# Patient Record
Sex: Female | Born: 1955 | Race: Black or African American | Hispanic: No | Marital: Single | Smoking: Never smoker
Health system: Southern US, Community
[De-identification: ages and names within clinical notes are randomized; demographics above are authoritative.]

## PROBLEM LIST (undated history)

## (undated) DIAGNOSIS — R569 Unspecified convulsions: Secondary | ICD-10-CM

## (undated) DIAGNOSIS — E785 Hyperlipidemia, unspecified: Secondary | ICD-10-CM

## (undated) DIAGNOSIS — I4891 Unspecified atrial fibrillation: Secondary | ICD-10-CM

## (undated) DIAGNOSIS — I10 Essential (primary) hypertension: Secondary | ICD-10-CM

## (undated) DIAGNOSIS — J189 Pneumonia, unspecified organism: Secondary | ICD-10-CM

## (undated) DIAGNOSIS — G40909 Epilepsy, unspecified, not intractable, without status epilepticus: Secondary | ICD-10-CM

## (undated) DIAGNOSIS — D649 Anemia, unspecified: Secondary | ICD-10-CM

## (undated) DIAGNOSIS — F329 Major depressive disorder, single episode, unspecified: Secondary | ICD-10-CM

## (undated) DIAGNOSIS — C73 Malignant neoplasm of thyroid gland: Secondary | ICD-10-CM

## (undated) DIAGNOSIS — R112 Nausea with vomiting, unspecified: Secondary | ICD-10-CM

## (undated) DIAGNOSIS — Z9889 Other specified postprocedural states: Secondary | ICD-10-CM

## (undated) DIAGNOSIS — E669 Obesity, unspecified: Secondary | ICD-10-CM

## (undated) DIAGNOSIS — R0600 Dyspnea, unspecified: Secondary | ICD-10-CM

## (undated) DIAGNOSIS — T07XXXA Unspecified multiple injuries, initial encounter: Secondary | ICD-10-CM

## (undated) DIAGNOSIS — F32A Depression, unspecified: Secondary | ICD-10-CM

## (undated) DIAGNOSIS — F70 Mild intellectual disabilities: Secondary | ICD-10-CM

## (undated) HISTORY — DX: Unspecified convulsions: R56.9

## (undated) HISTORY — DX: Obesity, unspecified: E66.9

## (undated) HISTORY — DX: Unspecified atrial fibrillation: I48.91

## (undated) HISTORY — DX: Essential (primary) hypertension: I10

## (undated) HISTORY — DX: Depression, unspecified: F32.A

## (undated) HISTORY — DX: Major depressive disorder, single episode, unspecified: F32.9

## (undated) HISTORY — DX: Unspecified multiple injuries, initial encounter: T07.XXXA

## (undated) HISTORY — DX: Hyperlipidemia, unspecified: E78.5

## (undated) HISTORY — DX: Epilepsy, unspecified, not intractable, without status epilepticus: G40.909

## (undated) HISTORY — DX: Mild intellectual disabilities: F70

## (undated) HISTORY — PX: TUBAL LIGATION: SHX77

---

## 1994-02-24 HISTORY — PX: BREAST LUMPECTOMY: SHX2

## 1999-02-25 HISTORY — PX: ABDOMINAL HYSTERECTOMY: SHX81

## 2000-12-09 ENCOUNTER — Ambulatory Visit (HOSPITAL_COMMUNITY): Admission: RE | Admit: 2000-12-09 | Discharge: 2000-12-09 | Payer: Self-pay | Admitting: General Surgery

## 2001-06-27 ENCOUNTER — Emergency Department (HOSPITAL_COMMUNITY): Admission: EM | Admit: 2001-06-27 | Discharge: 2001-06-27 | Payer: Self-pay | Admitting: *Deleted

## 2001-06-27 ENCOUNTER — Encounter: Payer: Self-pay | Admitting: *Deleted

## 2001-09-01 ENCOUNTER — Encounter: Payer: Self-pay | Admitting: Family Medicine

## 2001-09-01 ENCOUNTER — Ambulatory Visit (HOSPITAL_COMMUNITY): Admission: RE | Admit: 2001-09-01 | Discharge: 2001-09-01 | Payer: Self-pay | Admitting: Family Medicine

## 2002-10-30 ENCOUNTER — Emergency Department (HOSPITAL_COMMUNITY): Admission: EM | Admit: 2002-10-30 | Discharge: 2002-10-30 | Payer: Self-pay | Admitting: *Deleted

## 2003-01-11 ENCOUNTER — Ambulatory Visit (HOSPITAL_COMMUNITY): Admission: RE | Admit: 2003-01-11 | Discharge: 2003-01-11 | Payer: Self-pay | Admitting: Family Medicine

## 2003-08-21 ENCOUNTER — Emergency Department (HOSPITAL_COMMUNITY): Admission: EM | Admit: 2003-08-21 | Discharge: 2003-08-21 | Payer: Self-pay | Admitting: Emergency Medicine

## 2004-03-06 ENCOUNTER — Ambulatory Visit: Payer: Self-pay | Admitting: Family Medicine

## 2004-03-14 ENCOUNTER — Ambulatory Visit (HOSPITAL_COMMUNITY): Admission: RE | Admit: 2004-03-14 | Discharge: 2004-03-14 | Payer: Self-pay | Admitting: Family Medicine

## 2004-04-23 ENCOUNTER — Ambulatory Visit: Payer: Self-pay | Admitting: Family Medicine

## 2004-05-09 ENCOUNTER — Ambulatory Visit (HOSPITAL_COMMUNITY): Admission: RE | Admit: 2004-05-09 | Discharge: 2004-05-09 | Payer: Self-pay | Admitting: General Surgery

## 2004-05-09 LAB — HM COLONOSCOPY: HM Colonoscopy: NORMAL

## 2004-06-01 ENCOUNTER — Emergency Department (HOSPITAL_COMMUNITY): Admission: EM | Admit: 2004-06-01 | Discharge: 2004-06-01 | Payer: Self-pay | Admitting: Emergency Medicine

## 2004-08-21 ENCOUNTER — Ambulatory Visit: Payer: Self-pay | Admitting: Family Medicine

## 2004-12-13 ENCOUNTER — Ambulatory Visit: Payer: Self-pay | Admitting: Family Medicine

## 2004-12-13 ENCOUNTER — Emergency Department (HOSPITAL_COMMUNITY): Admission: EM | Admit: 2004-12-13 | Discharge: 2004-12-13 | Payer: Self-pay | Admitting: Emergency Medicine

## 2004-12-19 ENCOUNTER — Emergency Department (HOSPITAL_COMMUNITY): Admission: EM | Admit: 2004-12-19 | Discharge: 2004-12-19 | Payer: Self-pay | Admitting: Emergency Medicine

## 2004-12-31 ENCOUNTER — Ambulatory Visit: Payer: Self-pay | Admitting: Family Medicine

## 2005-02-06 ENCOUNTER — Ambulatory Visit: Payer: Self-pay | Admitting: Family Medicine

## 2005-02-11 ENCOUNTER — Ambulatory Visit (HOSPITAL_COMMUNITY): Admission: RE | Admit: 2005-02-11 | Discharge: 2005-02-11 | Payer: Self-pay | Admitting: Family Medicine

## 2005-02-18 ENCOUNTER — Encounter (HOSPITAL_COMMUNITY): Admission: RE | Admit: 2005-02-18 | Discharge: 2005-02-18 | Payer: Self-pay | Admitting: Family Medicine

## 2005-02-27 ENCOUNTER — Ambulatory Visit: Payer: Self-pay | Admitting: Family Medicine

## 2005-04-03 ENCOUNTER — Ambulatory Visit: Payer: Self-pay | Admitting: Family Medicine

## 2005-05-01 ENCOUNTER — Ambulatory Visit: Payer: Self-pay | Admitting: Family Medicine

## 2005-05-18 ENCOUNTER — Emergency Department (HOSPITAL_COMMUNITY): Admission: EM | Admit: 2005-05-18 | Discharge: 2005-05-18 | Payer: Self-pay | Admitting: Emergency Medicine

## 2005-05-22 ENCOUNTER — Ambulatory Visit: Payer: Self-pay | Admitting: Family Medicine

## 2005-05-26 ENCOUNTER — Ambulatory Visit (HOSPITAL_COMMUNITY): Admission: RE | Admit: 2005-05-26 | Discharge: 2005-05-26 | Payer: Self-pay | Admitting: Family Medicine

## 2005-06-03 ENCOUNTER — Other Ambulatory Visit: Admission: RE | Admit: 2005-06-03 | Discharge: 2005-06-03 | Payer: Self-pay | Admitting: Family Medicine

## 2005-06-03 ENCOUNTER — Encounter: Payer: Self-pay | Admitting: Family Medicine

## 2005-06-03 ENCOUNTER — Ambulatory Visit: Payer: Self-pay | Admitting: Family Medicine

## 2005-07-14 ENCOUNTER — Ambulatory Visit: Payer: Self-pay | Admitting: Family Medicine

## 2005-08-14 ENCOUNTER — Ambulatory Visit (HOSPITAL_COMMUNITY): Admission: RE | Admit: 2005-08-14 | Discharge: 2005-08-14 | Payer: Self-pay | Admitting: General Surgery

## 2005-09-08 ENCOUNTER — Ambulatory Visit: Payer: Self-pay | Admitting: Family Medicine

## 2005-09-09 ENCOUNTER — Ambulatory Visit (HOSPITAL_COMMUNITY): Admission: RE | Admit: 2005-09-09 | Discharge: 2005-09-09 | Payer: Self-pay | Admitting: Family Medicine

## 2005-09-17 ENCOUNTER — Encounter (HOSPITAL_COMMUNITY): Admission: RE | Admit: 2005-09-17 | Discharge: 2005-10-17 | Payer: Self-pay | Admitting: Endocrinology

## 2005-10-14 ENCOUNTER — Ambulatory Visit: Payer: Self-pay | Admitting: Family Medicine

## 2005-10-30 ENCOUNTER — Encounter (HOSPITAL_COMMUNITY): Admission: RE | Admit: 2005-10-30 | Discharge: 2005-11-22 | Payer: Self-pay | Admitting: Endocrinology

## 2005-11-19 ENCOUNTER — Ambulatory Visit: Payer: Self-pay | Admitting: Family Medicine

## 2005-11-28 ENCOUNTER — Ambulatory Visit: Payer: Self-pay | Admitting: Family Medicine

## 2005-12-10 ENCOUNTER — Ambulatory Visit: Payer: Self-pay | Admitting: Family Medicine

## 2005-12-12 ENCOUNTER — Ambulatory Visit: Payer: Self-pay | Admitting: Family Medicine

## 2006-02-12 ENCOUNTER — Ambulatory Visit: Payer: Self-pay | Admitting: Family Medicine

## 2006-02-23 ENCOUNTER — Ambulatory Visit: Payer: Self-pay | Admitting: Family Medicine

## 2006-03-24 ENCOUNTER — Ambulatory Visit: Payer: Self-pay | Admitting: Family Medicine

## 2006-03-25 ENCOUNTER — Encounter: Payer: Self-pay | Admitting: Family Medicine

## 2006-03-25 LAB — CONVERTED CEMR LAB
ALT: 16 units/L (ref 0–35)
AST: 30 units/L (ref 0–37)
Albumin: 4.4 g/dL (ref 3.5–5.2)
Alkaline Phosphatase: 128 units/L — ABNORMAL HIGH (ref 39–117)
BUN: 30 mg/dL — ABNORMAL HIGH (ref 6–23)
Bilirubin, Direct: 0.1 mg/dL (ref 0.0–0.3)
CO2: 22 meq/L (ref 19–32)
Calcium: 9.3 mg/dL (ref 8.4–10.5)
Chloride: 104 meq/L (ref 96–112)
Cholesterol: 185 mg/dL (ref 0–200)
Creatinine, Ser: 1.22 mg/dL — ABNORMAL HIGH (ref 0.40–1.20)
Glucose, Bld: 69 mg/dL — ABNORMAL LOW (ref 70–99)
HDL: 57 mg/dL (ref >39)
Indirect Bilirubin: 0.6 mg/dL (ref 0.0–0.9)
LDL Cholesterol: 112 mg/dL — ABNORMAL HIGH (ref 0–99)
Potassium: 5.6 meq/L — ABNORMAL HIGH (ref 3.5–5.3)
Sodium: 140 meq/L (ref 135–145)
Total Bilirubin: 0.7 mg/dL (ref 0.3–1.2)
Total CHOL/HDL Ratio: 3.2
Total Protein: 8.6 g/dL — ABNORMAL HIGH (ref 6.0–8.3)
Triglycerides: 80 mg/dL (ref <150)
VLDL: 16 mg/dL (ref 0–40)

## 2006-04-09 ENCOUNTER — Ambulatory Visit: Payer: Self-pay | Admitting: Family Medicine

## 2006-06-15 ENCOUNTER — Ambulatory Visit: Payer: Self-pay | Admitting: Family Medicine

## 2006-07-28 ENCOUNTER — Ambulatory Visit: Payer: Self-pay | Admitting: Family Medicine

## 2006-07-30 ENCOUNTER — Ambulatory Visit (HOSPITAL_COMMUNITY): Admission: RE | Admit: 2006-07-30 | Discharge: 2006-07-30 | Payer: Self-pay | Admitting: Family Medicine

## 2006-08-18 ENCOUNTER — Ambulatory Visit: Payer: Self-pay | Admitting: Family Medicine

## 2006-08-25 ENCOUNTER — Encounter: Payer: Self-pay | Admitting: Family Medicine

## 2006-08-25 LAB — CONVERTED CEMR LAB
ALT: 12 units/L (ref 0–35)
AST: 18 units/L (ref 0–37)
Albumin: 4.6 g/dL (ref 3.5–5.2)
Alkaline Phosphatase: 112 units/L (ref 39–117)
BUN: 28 mg/dL — ABNORMAL HIGH (ref 6–23)
Bilirubin, Direct: 0.1 mg/dL (ref 0.0–0.3)
CO2: 23 meq/L (ref 19–32)
Calcium: 9.2 mg/dL (ref 8.4–10.5)
Chloride: 104 meq/L (ref 96–112)
Cholesterol: 167 mg/dL (ref 0–200)
Creatinine, Ser: 1.39 mg/dL — ABNORMAL HIGH (ref 0.40–1.20)
Glucose, Bld: 76 mg/dL (ref 70–99)
HDL: 53 mg/dL (ref >39)
Indirect Bilirubin: 0.5 mg/dL (ref 0.0–0.9)
LDL Cholesterol: 100 mg/dL — ABNORMAL HIGH (ref 0–99)
Potassium: 3.9 meq/L (ref 3.5–5.3)
Sodium: 140 meq/L (ref 135–145)
Total Bilirubin: 0.6 mg/dL (ref 0.3–1.2)
Total CHOL/HDL Ratio: 3.2
Total Protein: 8.3 g/dL (ref 6.0–8.3)
Triglycerides: 70 mg/dL (ref <150)
VLDL: 14 mg/dL (ref 0–40)

## 2006-09-02 ENCOUNTER — Encounter: Payer: Self-pay | Admitting: Family Medicine

## 2006-09-02 ENCOUNTER — Ambulatory Visit: Payer: Self-pay | Admitting: Family Medicine

## 2006-09-02 ENCOUNTER — Other Ambulatory Visit: Admission: RE | Admit: 2006-09-02 | Discharge: 2006-09-02 | Payer: Self-pay | Admitting: Family Medicine

## 2006-09-02 LAB — CONVERTED CEMR LAB
Chlamydia, DNA Probe: NEGATIVE
GC Probe Amp, Genital: NEGATIVE
Pap Smear: NORMAL

## 2006-09-03 ENCOUNTER — Encounter: Payer: Self-pay | Admitting: Family Medicine

## 2006-09-03 LAB — CONVERTED CEMR LAB
Candida species: NEGATIVE
Gardnerella vaginalis: POSITIVE — AB
Trichomonal Vaginitis: NEGATIVE

## 2006-10-12 ENCOUNTER — Ambulatory Visit: Payer: Self-pay | Admitting: Family Medicine

## 2006-10-28 ENCOUNTER — Ambulatory Visit: Payer: Self-pay | Admitting: Family Medicine

## 2006-11-03 ENCOUNTER — Encounter: Payer: Self-pay | Admitting: Family Medicine

## 2006-11-03 LAB — CONVERTED CEMR LAB
ALT: 13 units/L (ref 0–35)
AST: 21 units/L (ref 0–37)
Albumin: 4.5 g/dL (ref 3.5–5.2)
Alkaline Phosphatase: 109 units/L (ref 39–117)
BUN: 21 mg/dL (ref 6–23)
Bilirubin, Direct: 0.2 mg/dL (ref 0.0–0.3)
CO2: 23 meq/L (ref 19–32)
Calcium: 9.3 mg/dL (ref 8.4–10.5)
Chloride: 103 meq/L (ref 96–112)
Cholesterol: 174 mg/dL (ref 0–200)
Creatinine, Ser: 1.41 mg/dL — ABNORMAL HIGH (ref 0.40–1.20)
Glucose, Bld: 82 mg/dL (ref 70–99)
HDL: 60 mg/dL (ref >39)
Indirect Bilirubin: 0.7 mg/dL (ref 0.0–0.9)
LDL Cholesterol: 98 mg/dL (ref 0–99)
Potassium: 3.7 meq/L (ref 3.5–5.3)
Sodium: 141 meq/L (ref 135–145)
Total Bilirubin: 0.9 mg/dL (ref 0.3–1.2)
Total CHOL/HDL Ratio: 2.9
Total Protein: 8.3 g/dL (ref 6.0–8.3)
Triglycerides: 78 mg/dL (ref <150)
VLDL: 16 mg/dL (ref 0–40)

## 2006-11-25 ENCOUNTER — Ambulatory Visit: Payer: Self-pay | Admitting: Family Medicine

## 2006-11-26 ENCOUNTER — Encounter: Payer: Self-pay | Admitting: Family Medicine

## 2006-11-28 ENCOUNTER — Emergency Department (HOSPITAL_COMMUNITY): Admission: EM | Admit: 2006-11-28 | Discharge: 2006-11-28 | Payer: Self-pay | Admitting: Emergency Medicine

## 2006-11-30 ENCOUNTER — Ambulatory Visit: Payer: Self-pay | Admitting: Family Medicine

## 2006-12-22 ENCOUNTER — Ambulatory Visit: Payer: Self-pay | Admitting: Family Medicine

## 2007-02-04 ENCOUNTER — Ambulatory Visit: Payer: Self-pay | Admitting: Family Medicine

## 2007-03-02 ENCOUNTER — Ambulatory Visit: Payer: Self-pay | Admitting: Family Medicine

## 2007-04-12 ENCOUNTER — Ambulatory Visit: Payer: Self-pay | Admitting: Family Medicine

## 2007-04-28 ENCOUNTER — Encounter: Payer: Self-pay | Admitting: Family Medicine

## 2007-04-28 LAB — CONVERTED CEMR LAB
ALT: 13 units/L (ref 0–35)
AST: 20 units/L (ref 0–37)
Albumin: 4.2 g/dL (ref 3.5–5.2)
Alkaline Phosphatase: 98 units/L (ref 39–117)
BUN: 30 mg/dL — ABNORMAL HIGH (ref 6–23)
Bilirubin, Direct: 0.1 mg/dL (ref 0.0–0.3)
CO2: 22 meq/L (ref 19–32)
Calcium: 9.5 mg/dL (ref 8.4–10.5)
Chloride: 105 meq/L (ref 96–112)
Cholesterol: 249 mg/dL — ABNORMAL HIGH (ref 0–200)
Creatinine, Ser: 1.24 mg/dL — ABNORMAL HIGH (ref 0.40–1.20)
Glucose, Bld: 94 mg/dL (ref 70–99)
HDL: 56 mg/dL (ref >39)
Indirect Bilirubin: 0.5 mg/dL (ref 0.0–0.9)
LDL Cholesterol: 172 mg/dL — ABNORMAL HIGH (ref 0–99)
Potassium: 4.9 meq/L (ref 3.5–5.3)
Sodium: 138 meq/L (ref 135–145)
Total Bilirubin: 0.6 mg/dL (ref 0.3–1.2)
Total CHOL/HDL Ratio: 4.4
Total Protein: 7.9 g/dL (ref 6.0–8.3)
Triglycerides: 107 mg/dL (ref <150)
VLDL: 21 mg/dL (ref 0–40)

## 2007-05-04 ENCOUNTER — Ambulatory Visit: Payer: Self-pay | Admitting: Family Medicine

## 2007-05-20 ENCOUNTER — Ambulatory Visit: Payer: Self-pay | Admitting: Family Medicine

## 2007-05-21 ENCOUNTER — Encounter: Payer: Self-pay | Admitting: Family Medicine

## 2007-06-03 ENCOUNTER — Ambulatory Visit: Payer: Self-pay | Admitting: Family Medicine

## 2007-07-15 ENCOUNTER — Encounter: Payer: Self-pay | Admitting: Family Medicine

## 2007-07-15 DIAGNOSIS — E785 Hyperlipidemia, unspecified: Secondary | ICD-10-CM | POA: Insufficient documentation

## 2007-07-15 DIAGNOSIS — F419 Anxiety disorder, unspecified: Secondary | ICD-10-CM | POA: Insufficient documentation

## 2007-07-15 DIAGNOSIS — R569 Unspecified convulsions: Secondary | ICD-10-CM | POA: Insufficient documentation

## 2007-07-15 DIAGNOSIS — F329 Major depressive disorder, single episode, unspecified: Secondary | ICD-10-CM | POA: Insufficient documentation

## 2007-07-15 DIAGNOSIS — F70 Mild intellectual disabilities: Secondary | ICD-10-CM | POA: Insufficient documentation

## 2007-07-15 DIAGNOSIS — F32A Depression, unspecified: Secondary | ICD-10-CM | POA: Insufficient documentation

## 2007-07-15 DIAGNOSIS — I10 Essential (primary) hypertension: Secondary | ICD-10-CM | POA: Insufficient documentation

## 2007-07-15 DIAGNOSIS — G40909 Epilepsy, unspecified, not intractable, without status epilepticus: Secondary | ICD-10-CM | POA: Insufficient documentation

## 2007-07-30 ENCOUNTER — Encounter: Payer: Self-pay | Admitting: Family Medicine

## 2007-07-30 LAB — CONVERTED CEMR LAB
ALT: 13 units/L (ref 0–35)
AST: 20 units/L (ref 0–37)
Albumin: 4.3 g/dL (ref 3.5–5.2)
Alkaline Phosphatase: 104 units/L (ref 39–117)
Bilirubin, Direct: 0.1 mg/dL (ref 0.0–0.3)
Cholesterol: 179 mg/dL (ref 0–200)
HDL: 62 mg/dL (ref >39)
Indirect Bilirubin: 0.4 mg/dL (ref 0.0–0.9)
LDL Cholesterol: 102 mg/dL — ABNORMAL HIGH (ref 0–99)
Total Bilirubin: 0.5 mg/dL (ref 0.3–1.2)
Total CHOL/HDL Ratio: 2.9
Total Protein: 8 g/dL (ref 6.0–8.3)
Triglycerides: 76 mg/dL (ref <150)
VLDL: 15 mg/dL (ref 0–40)

## 2007-08-04 ENCOUNTER — Ambulatory Visit: Payer: Self-pay | Admitting: Family Medicine

## 2007-08-17 ENCOUNTER — Ambulatory Visit: Payer: Self-pay | Admitting: Family Medicine

## 2007-09-15 ENCOUNTER — Other Ambulatory Visit: Admission: RE | Admit: 2007-09-15 | Discharge: 2007-09-15 | Payer: Self-pay | Admitting: Family Medicine

## 2007-09-15 ENCOUNTER — Ambulatory Visit: Payer: Self-pay | Admitting: Family Medicine

## 2007-09-15 ENCOUNTER — Encounter: Payer: Self-pay | Admitting: Family Medicine

## 2007-10-04 ENCOUNTER — Encounter: Payer: Self-pay | Admitting: Family Medicine

## 2007-10-05 ENCOUNTER — Encounter: Payer: Self-pay | Admitting: Family Medicine

## 2007-10-14 ENCOUNTER — Encounter: Payer: Self-pay | Admitting: Family Medicine

## 2007-11-10 ENCOUNTER — Encounter: Payer: Self-pay | Admitting: Family Medicine

## 2007-11-16 ENCOUNTER — Encounter: Payer: Self-pay | Admitting: Family Medicine

## 2007-12-03 ENCOUNTER — Telehealth: Payer: Self-pay | Admitting: Family Medicine

## 2007-12-08 ENCOUNTER — Encounter: Payer: Self-pay | Admitting: Family Medicine

## 2007-12-14 ENCOUNTER — Ambulatory Visit: Payer: Self-pay | Admitting: Family Medicine

## 2007-12-16 ENCOUNTER — Ambulatory Visit: Payer: Self-pay | Admitting: Family Medicine

## 2008-02-01 ENCOUNTER — Emergency Department (HOSPITAL_COMMUNITY): Admission: EM | Admit: 2008-02-01 | Discharge: 2008-02-01 | Payer: Self-pay | Admitting: Emergency Medicine

## 2008-02-03 ENCOUNTER — Ambulatory Visit: Payer: Self-pay | Admitting: Family Medicine

## 2008-02-11 DIAGNOSIS — A638 Other specified predominantly sexually transmitted diseases: Secondary | ICD-10-CM | POA: Insufficient documentation

## 2008-02-28 ENCOUNTER — Encounter: Payer: Self-pay | Admitting: Family Medicine

## 2008-03-13 ENCOUNTER — Encounter: Payer: Self-pay | Admitting: Family Medicine

## 2008-03-27 ENCOUNTER — Ambulatory Visit: Payer: Self-pay | Admitting: Family Medicine

## 2008-04-04 ENCOUNTER — Encounter: Payer: Self-pay | Admitting: Family Medicine

## 2008-04-17 ENCOUNTER — Encounter: Payer: Self-pay | Admitting: Family Medicine

## 2008-04-18 ENCOUNTER — Encounter: Payer: Self-pay | Admitting: Family Medicine

## 2008-04-18 LAB — CONVERTED CEMR LAB
ALT: 13 units/L (ref 0–35)
AST: 18 units/L (ref 0–37)
Albumin: 4.4 g/dL (ref 3.5–5.2)
Alkaline Phosphatase: 107 units/L (ref 39–117)
BUN: 25 mg/dL — ABNORMAL HIGH (ref 6–23)
Bilirubin, Direct: 0.2 mg/dL (ref 0.0–0.3)
CO2: 19 meq/L (ref 19–32)
Calcium: 9.5 mg/dL (ref 8.4–10.5)
Chloride: 104 meq/L (ref 96–112)
Cholesterol: 196 mg/dL (ref 0–200)
Creatinine, Ser: 1.34 mg/dL — ABNORMAL HIGH (ref 0.40–1.20)
Glucose, Bld: 82 mg/dL (ref 70–99)
HDL: 56 mg/dL (ref >39)
Indirect Bilirubin: 0.4 mg/dL (ref 0.0–0.9)
LDL Cholesterol: 120 mg/dL — ABNORMAL HIGH (ref 0–99)
Potassium: 4.3 meq/L (ref 3.5–5.3)
Sodium: 139 meq/L (ref 135–145)
Total Bilirubin: 0.6 mg/dL (ref 0.3–1.2)
Total CHOL/HDL Ratio: 3.5
Total Protein: 8.4 g/dL — ABNORMAL HIGH (ref 6.0–8.3)
Triglycerides: 99 mg/dL (ref <150)
VLDL: 20 mg/dL (ref 0–40)

## 2008-04-20 ENCOUNTER — Ambulatory Visit: Payer: Self-pay | Admitting: Family Medicine

## 2008-04-26 ENCOUNTER — Ambulatory Visit (HOSPITAL_COMMUNITY): Admission: RE | Admit: 2008-04-26 | Discharge: 2008-04-26 | Payer: Self-pay | Admitting: Family Medicine

## 2008-06-12 ENCOUNTER — Ambulatory Visit: Payer: Self-pay | Admitting: Family Medicine

## 2008-06-13 ENCOUNTER — Encounter: Payer: Self-pay | Admitting: Family Medicine

## 2008-08-02 ENCOUNTER — Encounter: Payer: Self-pay | Admitting: Family Medicine

## 2008-08-02 LAB — CONVERTED CEMR LAB
ALT: 15 units/L (ref 0–35)
AST: 20 units/L (ref 0–37)
Albumin: 4.1 g/dL (ref 3.5–5.2)
Alkaline Phosphatase: 104 units/L (ref 39–117)
Basophils Absolute: 0 10*3/uL (ref 0.0–0.1)
Basophils Relative: 1 % (ref 0–1)
Bilirubin, Direct: 0.1 mg/dL (ref 0.0–0.3)
Cholesterol: 172 mg/dL (ref 0–200)
Eosinophils Absolute: 0.1 10*3/uL (ref 0.0–0.7)
Eosinophils Relative: 2 % (ref 0–5)
HCT: 34.8 % — ABNORMAL LOW (ref 36.0–46.0)
HDL: 51 mg/dL (ref >39)
Hemoglobin: 11.3 g/dL — ABNORMAL LOW (ref 12.0–15.0)
Indirect Bilirubin: 0.4 mg/dL (ref 0.0–0.9)
LDL Cholesterol: 104 mg/dL — ABNORMAL HIGH (ref 0–99)
Lymphocytes Relative: 40 % (ref 12–46)
Lymphs Abs: 1.4 10*3/uL (ref 0.7–4.0)
MCHC: 32.5 g/dL (ref 30.0–36.0)
MCV: 84.1 fL (ref 78.0–100.0)
Monocytes Absolute: 0.4 10*3/uL (ref 0.1–1.0)
Monocytes Relative: 11 % (ref 3–12)
Neutro Abs: 1.7 10*3/uL (ref 1.7–7.7)
Neutrophils Relative %: 47 % (ref 43–77)
Platelets: 176 10*3/uL (ref 150–400)
RBC: 4.14 M/uL (ref 3.87–5.11)
RDW: 14.7 % (ref 11.5–15.5)
Retic Ct Pct: 1.3 % (ref 0.4–3.1)
Total Bilirubin: 0.5 mg/dL (ref 0.3–1.2)
Total CHOL/HDL Ratio: 3.4
Total Protein: 8.1 g/dL (ref 6.0–8.3)
Triglycerides: 87 mg/dL (ref <150)
VLDL: 17 mg/dL (ref 0–40)
WBC: 3.6 10*3/uL — ABNORMAL LOW (ref 4.0–10.5)

## 2008-08-03 ENCOUNTER — Ambulatory Visit: Payer: Self-pay | Admitting: Family Medicine

## 2008-08-04 ENCOUNTER — Encounter: Payer: Self-pay | Admitting: Family Medicine

## 2008-08-04 LAB — CONVERTED CEMR LAB
BUN: 30 mg/dL — ABNORMAL HIGH (ref 6–23)
CO2: 18 meq/L — ABNORMAL LOW (ref 19–32)
Calcium: 9.2 mg/dL (ref 8.4–10.5)
Chloride: 109 meq/L (ref 96–112)
Creatinine, Ser: 1.52 mg/dL — ABNORMAL HIGH (ref 0.40–1.20)
Glucose, Bld: 86 mg/dL (ref 70–99)
Potassium: 4.3 meq/L (ref 3.5–5.3)
Sodium: 145 meq/L (ref 135–145)
TSH: 2.788 microintl units/mL (ref 0.350–4.500)

## 2008-08-07 ENCOUNTER — Encounter: Payer: Self-pay | Admitting: Family Medicine

## 2008-08-08 ENCOUNTER — Encounter: Payer: Self-pay | Admitting: Family Medicine

## 2008-08-14 ENCOUNTER — Encounter: Payer: Self-pay | Admitting: Family Medicine

## 2008-08-23 ENCOUNTER — Encounter: Payer: Self-pay | Admitting: Family Medicine

## 2008-09-01 ENCOUNTER — Encounter: Payer: Self-pay | Admitting: Family Medicine

## 2008-09-28 ENCOUNTER — Encounter: Payer: Self-pay | Admitting: Family Medicine

## 2008-11-29 ENCOUNTER — Encounter: Payer: Self-pay | Admitting: Family Medicine

## 2008-12-06 ENCOUNTER — Encounter: Payer: Self-pay | Admitting: Family Medicine

## 2008-12-07 ENCOUNTER — Other Ambulatory Visit: Admission: RE | Admit: 2008-12-07 | Discharge: 2008-12-07 | Payer: Self-pay | Admitting: Family Medicine

## 2008-12-07 ENCOUNTER — Encounter: Payer: Self-pay | Admitting: Family Medicine

## 2008-12-07 ENCOUNTER — Ambulatory Visit: Payer: Self-pay | Admitting: Family Medicine

## 2008-12-07 DIAGNOSIS — R079 Chest pain, unspecified: Secondary | ICD-10-CM | POA: Insufficient documentation

## 2008-12-08 ENCOUNTER — Encounter: Payer: Self-pay | Admitting: Family Medicine

## 2008-12-08 ENCOUNTER — Telehealth: Payer: Self-pay | Admitting: Family Medicine

## 2008-12-08 LAB — CONVERTED CEMR LAB
ALT: 16 units/L (ref 0–35)
AST: 23 units/L (ref 0–37)
Albumin: 4.3 g/dL (ref 3.5–5.2)
Alkaline Phosphatase: 122 units/L — ABNORMAL HIGH (ref 39–117)
BUN: 33 mg/dL — ABNORMAL HIGH (ref 6–23)
Bilirubin, Direct: 0.1 mg/dL (ref 0.0–0.3)
CO2: 20 meq/L (ref 19–32)
Calcium: 9.5 mg/dL (ref 8.4–10.5)
Chloride: 105 meq/L (ref 96–112)
Cholesterol: 171 mg/dL (ref 0–200)
Creatinine, Ser: 1.74 mg/dL — ABNORMAL HIGH (ref 0.40–1.20)
Glucose, Bld: 91 mg/dL (ref 70–99)
HDL: 56 mg/dL (ref >39)
Indirect Bilirubin: 0.3 mg/dL (ref 0.0–0.9)
LDL Cholesterol: 95 mg/dL (ref 0–99)
Potassium: 4.5 meq/L (ref 3.5–5.3)
Sodium: 142 meq/L (ref 135–145)
Total Bilirubin: 0.4 mg/dL (ref 0.3–1.2)
Total CHOL/HDL Ratio: 3.1
Total Protein: 8.4 g/dL — ABNORMAL HIGH (ref 6.0–8.3)
Triglycerides: 98 mg/dL (ref <150)
VLDL: 20 mg/dL (ref 0–40)

## 2009-01-30 ENCOUNTER — Encounter: Payer: Self-pay | Admitting: Family Medicine

## 2009-02-26 ENCOUNTER — Encounter: Payer: Self-pay | Admitting: Family Medicine

## 2009-03-20 ENCOUNTER — Encounter: Payer: Self-pay | Admitting: Family Medicine

## 2009-04-02 ENCOUNTER — Ambulatory Visit: Payer: Self-pay | Admitting: Family Medicine

## 2009-04-03 ENCOUNTER — Encounter: Payer: Self-pay | Admitting: Family Medicine

## 2009-04-04 ENCOUNTER — Encounter: Payer: Self-pay | Admitting: Family Medicine

## 2009-04-10 ENCOUNTER — Ambulatory Visit: Payer: Self-pay | Admitting: Family Medicine

## 2009-04-10 DIAGNOSIS — G619 Inflammatory polyneuropathy, unspecified: Secondary | ICD-10-CM | POA: Insufficient documentation

## 2009-04-10 DIAGNOSIS — N3000 Acute cystitis without hematuria: Secondary | ICD-10-CM | POA: Insufficient documentation

## 2009-04-10 DIAGNOSIS — G622 Polyneuropathy due to other toxic agents: Secondary | ICD-10-CM | POA: Insufficient documentation

## 2009-04-10 LAB — CONVERTED CEMR LAB
Bilirubin Urine: NEGATIVE
Glucose, Urine, Semiquant: NEGATIVE
Ketones, urine, test strip: NEGATIVE
Nitrite: NEGATIVE
Protein, U semiquant: 100
Specific Gravity, Urine: 1.02
Urobilinogen, UA: 0.2
pH: 5.5

## 2009-04-18 ENCOUNTER — Encounter: Payer: Self-pay | Admitting: Family Medicine

## 2009-05-10 ENCOUNTER — Ambulatory Visit (HOSPITAL_COMMUNITY): Admission: RE | Admit: 2009-05-10 | Discharge: 2009-05-10 | Payer: Self-pay | Admitting: Family Medicine

## 2009-05-18 ENCOUNTER — Encounter: Payer: Self-pay | Admitting: Family Medicine

## 2009-06-01 ENCOUNTER — Encounter: Payer: Self-pay | Admitting: Family Medicine

## 2009-06-08 ENCOUNTER — Encounter: Payer: Self-pay | Admitting: Family Medicine

## 2009-06-26 ENCOUNTER — Encounter: Payer: Self-pay | Admitting: Family Medicine

## 2009-07-26 ENCOUNTER — Telehealth: Payer: Self-pay | Admitting: Family Medicine

## 2009-08-06 ENCOUNTER — Ambulatory Visit: Payer: Self-pay | Admitting: Family Medicine

## 2009-08-16 ENCOUNTER — Ambulatory Visit: Payer: Self-pay | Admitting: Family Medicine

## 2009-08-16 DIAGNOSIS — E559 Vitamin D deficiency, unspecified: Secondary | ICD-10-CM | POA: Insufficient documentation

## 2009-08-16 DIAGNOSIS — R5383 Other fatigue: Secondary | ICD-10-CM

## 2009-08-16 DIAGNOSIS — R5381 Other malaise: Secondary | ICD-10-CM | POA: Insufficient documentation

## 2009-08-16 LAB — CONVERTED CEMR LAB
ALT: 11 units/L (ref 0–35)
AST: 18 units/L (ref 0–37)
Albumin: 4.3 g/dL (ref 3.5–5.2)
Alkaline Phosphatase: 126 units/L — ABNORMAL HIGH (ref 39–117)
BUN: 27 mg/dL — ABNORMAL HIGH (ref 6–23)
Basophils Absolute: 0 10*3/uL (ref 0.0–0.1)
Basophils Relative: 1 % (ref 0–1)
Bilirubin, Direct: 0.2 mg/dL (ref 0.0–0.3)
CO2: 20 meq/L (ref 19–32)
Calcium: 9.5 mg/dL (ref 8.4–10.5)
Chloride: 104 meq/L (ref 96–112)
Cholesterol: 179 mg/dL (ref 0–200)
Creatinine, Ser: 1.57 mg/dL — ABNORMAL HIGH (ref 0.40–1.20)
Eosinophils Absolute: 0.1 10*3/uL (ref 0.0–0.7)
Eosinophils Relative: 1 % (ref 0–5)
Glucose, Bld: 83 mg/dL (ref 70–99)
HCT: 36 % (ref 36.0–46.0)
HDL: 46 mg/dL (ref >39)
Hemoglobin: 11.5 g/dL — ABNORMAL LOW (ref 12.0–15.0)
Indirect Bilirubin: 0.3 mg/dL (ref 0.0–0.9)
LDL Cholesterol: 116 mg/dL — ABNORMAL HIGH (ref 0–99)
Lymphocytes Relative: 38 % (ref 12–46)
Lymphs Abs: 1.5 10*3/uL (ref 0.7–4.0)
MCHC: 31.9 g/dL (ref 30.0–36.0)
MCV: 82.6 fL (ref 78.0–100.0)
Monocytes Absolute: 0.4 10*3/uL (ref 0.1–1.0)
Monocytes Relative: 9 % (ref 3–12)
Neutro Abs: 2 10*3/uL (ref 1.7–7.7)
Neutrophils Relative %: 51 % (ref 43–77)
Platelets: 207 10*3/uL (ref 150–400)
Potassium: 4.3 meq/L (ref 3.5–5.3)
RBC: 4.36 M/uL (ref 3.87–5.11)
RDW: 14.7 % (ref 11.5–15.5)
Sodium: 139 meq/L (ref 135–145)
TSH: 1.78 microintl units/mL (ref 0.350–4.500)
Total Bilirubin: 0.5 mg/dL (ref 0.3–1.2)
Total CHOL/HDL Ratio: 3.9
Total Protein: 8.5 g/dL — ABNORMAL HIGH (ref 6.0–8.3)
Triglycerides: 84 mg/dL (ref <150)
VLDL: 17 mg/dL (ref 0–40)
Vit D, 25-Hydroxy: 38 ng/mL (ref 30–89)
WBC: 3.9 10*3/uL — ABNORMAL LOW (ref 4.0–10.5)

## 2009-08-29 ENCOUNTER — Encounter: Payer: Self-pay | Admitting: Family Medicine

## 2009-09-06 ENCOUNTER — Encounter: Payer: Self-pay | Admitting: Family Medicine

## 2009-09-11 ENCOUNTER — Ambulatory Visit (HOSPITAL_COMMUNITY): Admission: RE | Admit: 2009-09-11 | Discharge: 2009-09-11 | Payer: Self-pay | Admitting: Nephrology

## 2009-09-11 ENCOUNTER — Encounter: Payer: Self-pay | Admitting: Family Medicine

## 2009-10-02 ENCOUNTER — Encounter: Payer: Self-pay | Admitting: Family Medicine

## 2009-10-03 ENCOUNTER — Encounter: Payer: Self-pay | Admitting: Family Medicine

## 2009-12-26 ENCOUNTER — Ambulatory Visit: Payer: Self-pay | Admitting: Family Medicine

## 2009-12-26 LAB — CONVERTED CEMR LAB
ALT: <8 units/L (ref 0–35)
AST: 17 units/L (ref 0–37)
Albumin: 4.6 g/dL (ref 3.5–5.2)
Alkaline Phosphatase: 144 units/L — ABNORMAL HIGH (ref 39–117)
BUN: 20 mg/dL (ref 6–23)
Bilirubin, Direct: 0.2 mg/dL (ref 0.0–0.3)
CO2: 23 meq/L (ref 19–32)
Calcium: 9.1 mg/dL (ref 8.4–10.5)
Chloride: 104 meq/L (ref 96–112)
Cholesterol: 180 mg/dL (ref 0–200)
Creatinine, Ser: 1.51 mg/dL — ABNORMAL HIGH (ref 0.40–1.20)
Glucose, Bld: 90 mg/dL (ref 70–99)
HDL: 45 mg/dL (ref >39)
Indirect Bilirubin: 0.2 mg/dL (ref 0.0–0.9)
LDL Cholesterol: 114 mg/dL — ABNORMAL HIGH (ref 0–99)
Potassium: 4.3 meq/L (ref 3.5–5.3)
Sodium: 141 meq/L (ref 135–145)
Total Bilirubin: 0.4 mg/dL (ref 0.3–1.2)
Total CHOL/HDL Ratio: 4
Total Protein: 8.5 g/dL — ABNORMAL HIGH (ref 6.0–8.3)
Triglycerides: 105 mg/dL (ref <150)
VLDL: 21 mg/dL (ref 0–40)
Vit D, 25-Hydroxy: 47 ng/mL (ref 30–89)

## 2010-01-22 ENCOUNTER — Ambulatory Visit: Payer: Self-pay | Admitting: Family Medicine

## 2010-02-11 ENCOUNTER — Telehealth (INDEPENDENT_AMBULATORY_CARE_PROVIDER_SITE_OTHER): Payer: Self-pay | Admitting: *Deleted

## 2010-02-12 ENCOUNTER — Ambulatory Visit: Payer: Self-pay | Admitting: Family Medicine

## 2010-03-16 ENCOUNTER — Encounter: Payer: Self-pay | Admitting: Family Medicine

## 2010-03-17 ENCOUNTER — Encounter: Payer: Self-pay | Admitting: Family Medicine

## 2010-03-18 ENCOUNTER — Encounter: Payer: Self-pay | Admitting: Family Medicine

## 2010-03-24 LAB — CONVERTED CEMR LAB: OCCULT 1: NEGATIVE

## 2010-03-28 NOTE — Assessment & Plan Note (Signed)
Summary: office visit   Vital Signs:  Patient profile:   55 year old female Menstrual status:  hysterectomy Height:      64 inches Weight:      233.04 pounds BMI:     40.15 O2 Sat:      98 % on Room air Pulse rate:   106 / minute Pulse rhythm:   regular Resp:     16 per minute BP sitting:   124 / 80  (left arm)  Vitals Entered By: Louie Casa CMA (December 26, 2009 9:10 AM)  Nutrition Counseling: Patient's BMI is greater than 25 and therefore counseled on weight management options.  O2 Flow:  Room air CC: follow up   CC:  follow up.  History of Present Illness: Reports  that she has been  doing well. she is more active , butis still eating an excessive amt with continued weight gain. Denies recent fever or chills. Denies sinus pressure, nasal congestion , ear pain or sore throat. Denies chest congestion, or cough productive of sputum. Denies chest pain, palpitations, PND, orthopnea or leg swelling. Denies abdominal pain, nausea, vomitting, diarrhea or constipation. Denies change in bowel movements or bloody stool. Denies dysuria , frequency, incontinence or hesitancy. Denies  joint pain, swelling, or reduced mobility. Denies headaches, vertigo, seizures. Denies depression, anxiety or insomnia. Denies  rash, lesions, or itch.     Current Medications (verified): 1)  Loratadine 10 Mg  Tabs (Loratadine) .... Take 1 Tablet By Mouth Once A Day 2)  Lamictal 100 Mg  Tabs (Lamotrigine) .... Take 1 Tablet By Mouth Once A Day 3)  Keppra 750 Mg  Tabs (Levetiracetam) .... Take Two Tablets By Mouth Twice Daily 4)  Omeprazole 20 Mg  Tbec (Omeprazole) .... Take Two Tablets By Mouth Once Daily 5)  Fluoxetine Hcl 10 Mg Caps (Fluoxetine Hcl) .... Take 1 Capsule By Mouth Once A Day 6)  Lotensin Hct 20-12.5 Mg Tabs (Benazepril-Hydrochlorothiazide) .... One Tab By Mouth Once Daily. 7)  Vitamin D (Ergocalciferol) 50000 Unit Caps (Ergocalciferol) .... One Cap Weekly 8)  Simvastatin 40 Mg  Tabs (Simvastatin) .... Take 1 Tab By Mouth At Bedtime  Allergies (verified): No Known Drug Allergies  Review of Systems      See HPI General:  Complains of fatigue. Eyes:  Denies discharge, eye pain, and red eye. Psych:  Complains of alternate hallucination ( auditory/visual) and depression; denies mental problems, suicidal thoughts/plans, thoughts of violence, and unusual visions or sounds; improved on med. Endo:  Denies cold intolerance, excessive hunger, excessive thirst, excessive urination, and heat intolerance. Heme:  Denies abnormal bruising and bleeding. Allergy:  Denies hives or rash and itching eyes.  Physical Exam  General:  Well-developed,obese,in no acute distress; alert,appropriate and cooperative throughout examination HEENT: No facial asymmetry,  EOMI, No sinus tenderness, TM's Clear, oropharynx  pink and moist.   Chest: Clear to auscultation bilaterally.  CVS: S1, S2, No murmurs, No S3.   Abd: Soft, Nontender.  MS: Adequate ROM spine, hips, shoulders and knees.  Ext: No edema.   CNS: CN 2-12 intact, power tone and sensation normal throughout.   Skin: Intact, no visible lesions or rashes.  Psych: Good eye contact, normal affect.  Memory intact, not anxious or depressed appearing.    Impression & Recommendations:  Problem # 1:  VITAMIN D DEFICIENCY (ICD-268.9) Assessment Comment Only  Orders: T-Vitamin D (25-Hydroxy) [id number])  Problem # 2:  DEPRESSION (ICD-311) Assessment: Improved  Her updated medication list for this problem  includes:    Fluoxetine Hcl 10 Mg Caps (Fluoxetine hcl) .Marland Kitchen... Take 1 capsule by mouth once a day  Problem # 3:  SEIZURE DISORDER (ICD-780.39) Assessment: Improved  Her updated medication list for this problem includes:    Lamictal 100 Mg Tabs (Lamotrigine) .Marland Kitchen... Take 1 tablet by mouth once a day    Keppra 750 Mg Tabs (Levetiracetam) .Marland Kitchen... Take two tablets by mouth twice daily stable on current meds and followed by  neurology  Problem # 4:  HYPERTENSION (ICD-401.9) Assessment: Unchanged  Her updated medication list for this problem includes:    Lotensin Hct 20-12.5 Mg Tabs (Benazepril-hydrochlorothiazide) ..... One tab by mouth once daily.  Orders: T-Basic Metabolic Panel (99991111)  BP today: 124/80 Prior BP: 110/70 (08/16/2009)  Labs Reviewed: K+: 4.3 (08/16/2009) Creat: : 1.57 (08/16/2009)   Chol: 179 (08/16/2009)   HDL: 46 (08/16/2009)   LDL: 116 (08/16/2009)   TG: 84 (08/16/2009)  Problem # 5:  HYPERLIPIDEMIA (ICD-272.4) Assessment: Deteriorated  Her updated medication list for this problem includes:    Simvastatin 40 Mg Tabs (Simvastatin) .Marland Kitchen... Take 1 tab by mouth at bedtime  Orders: T-Hepatic Function [id number]) T-Lipid Profile [phone]) Low fat dietdiscussed and encouraged  Labs Reviewed: SGOT: 18 (08/16/2009)   SGPT: 11 (08/16/2009)   HDL:46 (08/16/2009), 56 (12/07/2008)  LDL:116 (08/16/2009), 95 (12/07/2008)  Chol:179 (08/16/2009), 171 (12/07/2008)  Trig:84 (08/16/2009), 98 (12/07/2008)  Complete Medication List: 1)  Loratadine 10 Mg Tabs (Loratadine) .... Take 1 tablet by mouth once a day 2)  Lamictal 100 Mg Tabs (Lamotrigine) .... Take 1 tablet by mouth once a day 3)  Keppra 750 Mg Tabs (Levetiracetam) .... Take two tablets by mouth twice daily 4)  Omeprazole 20 Mg Tbec (Omeprazole) .... Take two tablets by mouth once daily 5)  Fluoxetine Hcl 10 Mg Caps (Fluoxetine hcl) .... Take 1 capsule by mouth once a day 6)  Lotensin Hct 20-12.5 Mg Tabs (Benazepril-hydrochlorothiazide) .... One tab by mouth once daily. 7)  Simvastatin 40 Mg Tabs (Simvastatin) .... Take 1 tab by mouth at bedtime 8)  Oscal 500/200 D-3 500-200 Mg-unit Tabs (Calcium-vitamin d) .... Take 1 tablet by mouth three times a day  Other Orders: Influenza Vaccine NON MCR [id number])  Patient Instructions: 1)  CPE in 4 months 2)  It is important that you exercise regularly at least 20 minutes 5 times a  week. If you develop chest pain, have severe difficulty breathing, or feel very tired , stop exercising immediately and seek medical attention. 3)  You need to lose weight. Consider a lower calorie diet and regular exercise.  4)  BMP prior to visit, ICD-9: 5)  Hepatic Panel prior to visit, ICD-9:  fasting today 6)  Lipid Panel prior to visit, ICD-9: 7)  VitD 8)  Flu vac today Prescriptions: OSCAL 500/200 D-3 500-200 MG-UNIT TABS (CALCIUM-VITAMIN D) Take 1 tablet by mouth three times a day  #90 x 11   Entered and Authorized by:   Tula Nakayama MD   Signed by:   Tula Nakayama MD on 12/26/2009   Method used:   Printed then faxed to ...       [facility] (retail)       [place] [address]       [place], [place]  [address]       Ph: [phone]       Fax: [phone]   RxID:   [id number]    Orders Added: 1)  Est. Patient Level IV [99214] 2)  T-Basic Metabolic Panel 0000000 3)  T-Hepatic Function [80076-22960] 4)  T-Lipid Profile [80061-22930] 5)  T-Vitamin D (25-Hydroxy) [phone] 6)  Influenza Vaccine NON MCR [00028]   Immunizations Administered:  Influenza Vaccine:    Vaccine Type: Fluvax Non-MCR    Site: right deltoid    Mfr: novartis    Dose: 0.5 ml    Route: IM    Given by: Louie Casa CMA    Exp. Date: 06/2010    Lot #: [id number] 5p    VIS given: 09/18/09 version given December 26, 2009.   Immunizations Administered:  Influenza Vaccine:    Vaccine Type: Fluvax Non-MCR    Site: right deltoid    Mfr: novartis    Dose: 0.5 ml    Route: IM    Given by: Louie Casa CMA    Exp. Date: 06/2010    Lot #: [id number] 5p    VIS given: 09/18/09 version given December 26, 2009.

## 2010-03-28 NOTE — Miscellaneous (Signed)
Summary: Home Care Report  Home Care Report   Imported By: Dierdre Harness 05/18/2009 08:54:02  _____________________________________________________________________  External Attachment:    Type:   Image     Comment:   External Document

## 2010-03-28 NOTE — Progress Notes (Signed)
Summary: electromygraphy report  electromygraphy report   Imported By: Dierdre Harness 04/05/2009 13:26:31  _____________________________________________________________________  External Attachment:    Type:   Image     Comment:   External Document

## 2010-03-28 NOTE — Assessment & Plan Note (Signed)
Summary: office visit   Vital Signs:  Patient profile:   55 year old female Menstrual status:  hysterectomy Height:      64 inches Weight:      231 pounds BMI:     39.79 O2 Sat:      94 % Pulse rate:   81 / minute Pulse rhythm:   regular Resp:     16 per minute BP sitting:   110 / 70  (left arm) Cuff size:   large  Vitals Entered By: Kate Sable LPN (June 23, 624THL 624THL AM)  Nutrition Counseling: Patient's BMI is greater than 25 and therefore counseled on weight management options. CC: Follow up chronic problems   CC:  Follow up chronic problems.  History of Present Illness: Reports  that she has been  doing well. Denies recent fever or chills. Denies sinus pressure, nasal congestion , ear pain or sore throat. Denies chest congestion, or cough productive of sputum. Denies chest pain, palpitations, PND, orthopnea or leg swelling. Denies abdominal pain, nausea, vomitting, diarrhea or constipation.  Denies change in bowel movements or bloody stool. Denies dysuria , frequency, incontinence or hesitancy. Denies  joint pain, swelling, or reduced mobility. Denies headaches, vertigo, she does report recent seizure activity Denies depression, anxiety or insomnia. Denies  rash, lesions, or itch. She is not exercising regularly and has made no commitment to dietary change , with no change in her diet.     Current Medications (verified): 1)  Loratadine 10 Mg  Tabs (Loratadine) .... Take 1 Tablet By Mouth Once A Day 2)  Lamictal 100 Mg  Tabs (Lamotrigine) .... Take 1 Tablet By Mouth Once A Day 3)  Keppra 750 Mg  Tabs (Levetiracetam) .... Take Two Tablets By Mouth Twice Daily 4)  Omeprazole 20 Mg  Tbec (Omeprazole) .... Take Two Tablets By Mouth Once Daily 5)  Fluoxetine Hcl 10 Mg Caps (Fluoxetine Hcl) .... Take 1 Capsule By Mouth Once A Day 6)  Lotensin Hct 20-12.5 Mg Tabs (Benazepril-Hydrochlorothiazide) .... One Tab By Mouth Qd 7)  Simvastatin 80 Mg Tabs (Simvastatin) .... Take  1 Tab By Mouth At Bedtime 8)  Miralax  Powd (Polyethylene Glycol 3350) .Marland KitchenMarland KitchenMarland Kitchen 17 Grams in 8 Ounces of Water Daily 9)  Vitamin D (Ergocalciferol) 50000 Unit Caps (Ergocalciferol) .... One Cap Weekly  Allergies (verified): No Known Drug Allergies  Review of Systems      See HPI General:  Complains of fatigue. Eyes:  Denies blurring and discharge. Endo:  Denies cold intolerance, excessive hunger, excessive thirst, excessive urination, heat intolerance, polyuria, and weight change. Heme:  Denies abnormal bruising and bleeding. Allergy:  Denies hives or rash and itching eyes.  Physical Exam  General:  Well-developed,well-nourished,in no acute distress; alert,appropriate and cooperative throughout examination HEENT: No facial asymmetry,  EOMI, No sinus tenderness, TM's Clear, oropharynx  pink and moist.   Chest: Clear to auscultation bilaterally.  CVS: S1, S2, No murmurs, No S3.   Abd: Soft, Nontender.  MS: Adequate ROM spine, hips, shoulders and knees.  Ext: No edema.   CNS: CN 2-12 intact, power tone and sensation normal throughout.   Skin: Intact, no visible lesions or rashes.  Psych: Good eye contact, normal affect.  Memory intact, not anxious or depressed appearing.    Impression & Recommendations:  Problem # 1:  OBESITY (ICD-278.00) Assessment Unchanged  Ht: 64 (08/16/2009)   Wt: 231 (08/16/2009)   BMI: 39.79 (08/16/2009)  Problem # 2:  DEPRESSION (ICD-311) Assessment: Improved  Her updated  medication list for this problem includes:    Fluoxetine Hcl 10 Mg Caps (Fluoxetine hcl) .Marland Kitchen... Take 1 capsule by mouth once a day  Problem # 3:  SEIZURE DISORDER (ICD-780.39) Assessment: Deteriorated  Her updated medication list for this problem includes:    Lamictal 100 Mg Tabs (Lamotrigine) .Marland Kitchen... Take 1 tablet by mouth once a day    Keppra 750 Mg Tabs (Levetiracetam) .Marland Kitchen... Take two tablets by mouth twice daily followed by psych and reports consitency in taking her meds  Problem  # 4:  HYPERTENSION (ICD-401.9) Assessment: Unchanged  Her updated medication list for this problem includes:    Lotensin Hct 20-12.5 Mg Tabs (Benazepril-hydrochlorothiazide) ..... One tab by mouth qd  Orders: T-Basic Metabolic Panel (99991111)  BP today: 110/70 Prior BP: 110/80 (04/10/2009)  Labs Reviewed: K+: 4.5 (12/07/2008) Creat: : 1.74 (12/07/2008)   Chol: 171 (12/07/2008)   HDL: 56 (12/07/2008)   LDL: 95 (12/07/2008)   TG: 98 (12/07/2008)  Problem # 5:  HYPERLIPIDEMIA (ICD-272.4) Assessment: Comment Only  The following medications were removed from the medication list:    Simvastatin 80 Mg Tabs (Simvastatin) .Marland Kitchen... Take 1 tab by mouth at bedtime Her updated medication list for this problem includes:    Simvastatin 40 Mg Tabs (Simvastatin) .Marland Kitchen... Take 1 tab by mouth at bedtime  Orders: T-Hepatic Function [id number]) T-Lipid Profile [id number])  Labs Reviewed: SGOT: 23 (12/07/2008)   SGPT: 16 (12/07/2008)   HDL:56 (12/07/2008), 51 (08/01/2008)  LDL:95 (12/07/2008), 104 (08/01/2008)  Chol:171 (12/07/2008), 172 (08/01/2008)  Trig:98 (12/07/2008), 87 (08/01/2008)  Problem # 6:  FATIGUE (ICD-780.79) Assessment: Deteriorated  Orders: T-CBC w/Diff [id number]) T-TSH [id number])  Complete Medication List: 1)  Loratadine 10 Mg Tabs (Loratadine) .... Take 1 tablet by mouth once a day 2)  Lamictal 100 Mg Tabs (Lamotrigine) .... Take 1 tablet by mouth once a day 3)  Keppra 750 Mg Tabs (Levetiracetam) .... Take two tablets by mouth twice daily 4)  Omeprazole 20 Mg Tbec (Omeprazole) .... Take two tablets by mouth once daily 5)  Fluoxetine Hcl 10 Mg Caps (Fluoxetine hcl) .... Take 1 capsule by mouth once a day 6)  Lotensin Hct 20-12.5 Mg Tabs (Benazepril-hydrochlorothiazide) .... One tab by mouth qd 7)  Miralax Powd (Polyethylene glycol 3350) .Marland KitchenMarland KitchenMarland Kitchen 17 grams in 8 ounces of water daily 8)  Vitamin D (ergocalciferol) 50000 Unit Caps (Ergocalciferol) .... One cap weekly 9)   Simvastatin 40 Mg Tabs (Simvastatin) .... Take 1 tab by mouth at bedtime  Other Orders: T-Vitamin D (25-Hydroxy) [id number])  Patient Instructions: 1)  Please schedule a follow-up appointment in 4 months. 2)  It is important that you exercise regularly at least 20 minutes 5 times a week. If you develop chest pain, have severe difficulty breathing, or feel very tired , stop exercising immediately and seek medical attention. 3)  You need to lose weight. Consider a lower calorie diet and regular exercise.  4)  BMP prior to visit, ICD-9: 5)  Hepatic Panel prior to visit, ICD-9: 6)  Lipid Panel prior to visit, ICD-9:    7)  CBC w/ Diff prior to visit, ICD-9:  today. 8)  TSH prior to visit, ICD-9: 9)  New dose od simvastatin to start this week, med has been sent in 10)  Vit D Prescriptions: LOTENSIN HCT 20-12.5 MG TABS (BENAZEPRIL-HYDROCHLOROTHIAZIDE) ONE TAB by mouth QD  #30 x 4   Entered by:   Kate Sable LPN   Authorized by:   Tula Nakayama MD   Signed by:  Kate Sable LPN on [id number]   Method used:   Electronically to        [facility] (retail)       [place] [address]       [place], [place]  [address]       Ph: [phone]       Fax: [phone]   RxID:   [id number] OMEPRAZOLE 20 MG  TBEC (OMEPRAZOLE) Take two tablets by mouth once daily  #60 Each x 4   Entered by:   Kate Sable LPN   Authorized by:   Tula Nakayama MD   Signed by:   Kate Sable LPN on [id number]   Method used:   Electronically to        [facility] (retail)       [place] [address]       [place], [place]  [address]       Ph: [phone]       Fax: [phone]   [id number] SIMVASTATIN 40 MG TABS (SIMVASTATIN) Take 1 tab by mouth at bedtime  #30 x 4   Entered and Authorized by:   Tula Nakayama MD   Signed by:   Tula Nakayama MD on 08/16/2009   Method used:   Printed then faxed to ...       [facility]  (retail)       [place] [address]       [place], [place]  [address]       Ph: [phone]       Fax: [phone]   RxID:   [id number]

## 2010-03-28 NOTE — Miscellaneous (Signed)
Summary: Home Care Report  Home Care Report   Imported By: Dierdre Harness 06/08/2009 14:13:13  _____________________________________________________________________  External Attachment:    Type:   Image     Comment:   External Document

## 2010-03-28 NOTE — Progress Notes (Signed)
  Phone Note Call from Patient   Summary of Call: Crestor 40mg   is non preferred. Can you change to lovastatin or simvastatin? [place] Initial call taken by: Kate Sable LPN,  June  2, 624THL [id number] AM  Follow-up for Phone Call        Changed per dr. Moshe Cipro Follow-up by: Kate Sable LPN,  June  2, 624THL QA348G PM    New/Updated Medications: SIMVASTATIN 80 MG TABS (SIMVASTATIN) Take 1 tab by mouth at bedtime Prescriptions: SIMVASTATIN 80 MG TABS (SIMVASTATIN) Take 1 tab by mouth at bedtime  #30 x 3   Entered by:   Kate Sable LPN   Authorized by:   Tula Nakayama MD   Signed by:   Kate Sable LPN on [id number]   Method used:   Electronically to        [facility] (retail)       [place] [address] [place], [place]  [address]       Ph: [phone]       Fax: [phone]   RxID:   [id number]

## 2010-03-28 NOTE — Progress Notes (Signed)
Summary: dr.[email or web address]  dr.[email or web address]   Imported By: Dierdre Harness 10/08/2009 14:58:57  _____________________________________________________________________  External Attachment:    Type:   Image     Comment:   External Document

## 2010-03-28 NOTE — Progress Notes (Signed)
Summary: dr.[email or web address]  dr.[email or web address]   Imported By: Dierdre Harness 10/08/2009 14:58:19  _____________________________________________________________________  External Attachment:    Type:   Image     Comment:   External Document

## 2010-03-28 NOTE — Assessment & Plan Note (Signed)
Summary: office visit   Vital Signs:  Patient profile:   55 year old female Menstrual status:  hysterectomy Height:      64 inches Weight:      231.75 pounds BMI:     39.92 O2 Sat:      99 % Pulse rate:   72 / minute Pulse rhythm:   regular Resp:     16 per minute BP sitting:   110 / 80  (right arm) Cuff size:   large  Vitals Entered By: Kate Sable (April 10, 2009 8:55 AM)  Nutrition Counseling: Patient's BMI is greater than 25 and therefore counseled on weight management options. CC: Follow up chronic problems Is Patient Diabetic? No   CC:  Follow up chronic problems.  History of Present Illness: Reports  thatshe has been  doing well. Denies recent fever or chills. Denies sinus pressure, nasal congestion , ear pain or sore throat. Denies chest congestion, or cough productive of sputum. Denies chest pain, palpitations, PND, orthopnea or leg swelling. Denies abdominal pain, nausea, vomitting, diarrhea or constipation. Denies change in bowel movements or bloody stool.  Denies  joint pain, swelling, or reduced mobility. Denies headaches, vertigo, seizures. Denies depression, anxiety or insomnia. Denies  rash, lesions, or itch.     Current Medications (verified): 1)  Loratadine 10 Mg  Tabs (Loratadine) .... Take 1 Tablet By Mouth Once A Day 2)  Lamictal 100 Mg  Tabs (Lamotrigine) .... Take 1 Tablet By Mouth Once A Day 3)  Keppra 750 Mg  Tabs (Levetiracetam) .... Take Two Tablets By Mouth Twice Daily 4)  Omeprazole 20 Mg  Tbec (Omeprazole) .... Take Two Tablets By Mouth Once Daily 5)  Aspirin Ec 325 Mg  Tbec (Aspirin) .... Take 1 Tablet By Mouth Once A Day 6)  Fluoxetine Hcl 10 Mg Caps (Fluoxetine Hcl) .... Take 1 Capsule By Mouth Once A Day 7)  Lotensin Hct 20-12.5 Mg Tabs (Benazepril-Hydrochlorothiazide) .... One Tab By Mouth Qd 8)  Crestor 40 Mg Tabs (Rosuvastatin Calcium) .... Take 1 Tab By Mouth At Bedtime 9)  Miralax  Powd (Polyethylene Glycol 3350) .Marland KitchenMarland KitchenMarland Kitchen 17  Grams in 8 Ounces of Water Daily 10)  Vitamin D (Ergocalciferol) 50000 Unit Caps (Ergocalciferol) .... One Cap Weekly  Allergies (verified): No Known Drug Allergies  Review of Systems      See HPI Eyes:  Denies blurring and discharge. GU:  Complains of dysuria and urinary frequency; 1 week history. Neuro:  Complains of numbness. Endo:  Denies cold intolerance, excessive hunger, excessive thirst, excessive urination, heat intolerance, polyuria, and weight change. Heme:  Denies abnormal bruising and bleeding. Allergy:  Denies hives or rash and sneezing.  Physical Exam  General:  Well-developed,well-nourished,in no acute distress; alert,appropriate and cooperative throughout examination HEENT: No facial asymmetry,  EOMI, No sinus tenderness, TM's Clear, oropharynx  pink and moist.   Chest: Clear to auscultation bilaterally.  CVS: S1, S2, No murmurs, No S3.   Abd: Soft, Nontender.  MS: Adequate ROM spine, hips, shoulders and knees.  Ext: No edema.   CNS: CN 2-12 intact, power tone and sensation normal throughout.   Skin: Intact, no visible lesions or rashes.  Psych: Good eye contact, normal affect.  Memory intact, not anxious or depressed appearing.    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 Take 1 tablet by mouth once a day    Keppra 750 Mg Tabs (Levetiracetam) .Marland Kitchen... Take two tablets by mouth twice daily denies recent seizure activity  Problem # 4:  HYPERTENSION (ICD-401.9) Assessment: Unchanged  BP today: 110/80 Prior BP: 100/78 (12/07/2008)  Labs Reviewed: K+: 4.5 (12/07/2008) Creat: : 1.74 (12/07/2008)    Chol: 171 (12/07/2008)   HDL: 56 (12/07/2008)   LDL: 95 (12/07/2008)   TG: 98 (12/07/2008)  Orders: T-Basic Metabolic Panel ([id number])  Problem # 5:  POLYNEUROPATHY (ICD-357.9) Assessment: Comment Only new diagnosis being treated /investigated by neurology  Problem # 6:  ACUTE CYSTITIS (ICD-595.0) Assessment: Comment Only  Orders: UA Dipstick W/ Micro (manual) (81000) T-Culture, Urine [id number])  Encouraged to push clear liquids, get enough rest, and take acetaminophen as needed. To be seen in 10 days if no improvement, sooner if worse.  Her updated medication list for this problem includes:    Ciprofloxacin Hcl 500 Mg Tabs (Ciprofloxacin hcl) .Marland Kitchen... Take 1 tablet by mouth two times a day  Complete Medication List: 1)  Loratadine 10 Mg Tabs (Loratadine) .... Take 1 tablet by mouth once a day 2)  Lamictal 100 Mg Tabs (Lamotrigine) .... Take 1 tablet by mouth once a day 3)  Keppra 750 Mg Tabs (Levetiracetam) .... Take two tablets by mouth twice daily 4)  Omeprazole 20 Mg Tbec (Omeprazole) .... Take two tablets by mouth once daily 5)  Aspirin Ec 325 Mg Tbec (Aspirin) .... Take 1 tablet by mouth once a day 6)  Fluoxetine Hcl 10 Mg Caps (Fluoxetine hcl) .... Take 1 capsule by mouth once a day 7)  Lotensin Hct 20-12.5 Mg Tabs (Benazepril-hydrochlorothiazide) .... One tab by mouth qd 8)  Crestor 40 Mg Tabs (Rosuvastatin calcium) .... Take 1 tab by mouth at bedtime 9)  Miralax Powd (Polyethylene glycol 3350) .Marland KitchenMarland KitchenMarland Kitchen 17 grams in 8 ounces of water daily 10)  Vitamin D (ergocalciferol) 50000 Unit Caps (Ergocalciferol) .... One cap weekly 11)  Ciprofloxacin Hcl 500 Mg Tabs (Ciprofloxacin hcl) .... Take 1 tablet by mouth two times a day  Other Orders: T-Hepatic Function [phone]) T-Lipid Profile [id number]) Radiology Referral (Radiology)  Patient Instructions: 1)  f/u in early May. 2)  It is important that you exercise regularly at least 30 minutes 5 times a week. If you develop  chest pain, have severe difficulty breathing, or feel very tired , stop exercising immediately and seek medical attention. 3)  You need to lose weight. Consider a lower calorie diet and regular exercise.  4)  BMP prior to visit, ICD-9: 5)  Hepatic Panel prior to visit, ICD-9: first week in may 6)  Lipid Panel prior to visit, ICD-9: 7)  pls try to drink only water and approx 6 to 8 glasses daily Prescriptions: CIPROFLOXACIN HCL 500 MG TABS (CIPROFLOXACIN HCL) Take 1 tablet by mouth two times a day  #14 x 0   Entered and Authorized by:   Tula Nakayama MD   Signed by:   Tula Nakayama MD on 04/10/2009   Method used:   Electronically to        [facility] (retail)       [place] [address] [place], [place]  [address]       Ph: [phone]  Fax: [phone]   RxIDVD:[id number]     Laboratory Results   Urine Tests    Routine Urinalysis   Color: lt. yellow Appearance: Clear Glucose: negative   (Normal Range: Negative) Bilirubin: negative   (Normal Range: Negative) Ketone: negative   (Normal Range: Negative) Spec. Gravity: 1.020   (Normal Range: 1.003-1.035) Blood: small   (Normal Range: Negative) pH: 5.5   (Normal Range: 5.0-8.0) Protein: 100   (Normal Range: Negative) Urobilinogen: 0.2   (Normal Range: 0-1) Nitrite: negative   (Normal Range: Negative) Leukocyte Esterace: moderate   (Normal Range: Negative)

## 2010-03-28 NOTE — Miscellaneous (Signed)
Summary: Home Care Report  Home Care Report   Imported By: Dierdre Harness 08/06/2009 14:25:28  _____________________________________________________________________  External Attachment:    Type:   Image     Comment:   External Document

## 2010-03-28 NOTE — Assessment & Plan Note (Signed)
Summary: RECERT   Allergies: No Known Drug Allergies   Complete Medication List: 1)  Loratadine 10 Mg Tabs (Loratadine) .... Take 1 tablet by mouth once a day 2)  Lamictal 100 Mg Tabs (Lamotrigine) .... Take 1 tablet by mouth once a day 3)  Keppra 750 Mg Tabs (Levetiracetam) .... Take two tablets by mouth twice daily 4)  Omeprazole 20 Mg Tbec (Omeprazole) .... Take two tablets by mouth once daily 5)  Aspirin Ec 325 Mg Tbec (Aspirin) .... Take 1 tablet by mouth once a day 6)  Fluoxetine Hcl 10 Mg Caps (Fluoxetine hcl) .... Take 1 capsule by mouth once a day 7)  Lotensin Hct 20-12.5 Mg Tabs (Benazepril-hydrochlorothiazide) .... One tab by mouth qd 8)  Simvastatin 80 Mg Tabs (Simvastatin) .... Take 1 tab by mouth at bedtime 9)  Miralax Powd (Polyethylene glycol 3350) .Marland KitchenMarland KitchenMarland Kitchen 17 grams in 8 ounces of water daily 10)  Vitamin D (ergocalciferol) 50000 Unit Caps (Ergocalciferol) .... One cap weekly 11)  Ciprofloxacin Hcl 500 Mg Tabs (Ciprofloxacin hcl) .... Take 1 tablet by mouth two times a day  Other Orders: Re-certification of Home Health [phone])

## 2010-03-28 NOTE — Miscellaneous (Signed)
Summary: Home Care Report  Home Care Report   Imported By: Dierdre Harness 04/03/2009 09:27:19  _____________________________________________________________________  External Attachment:    Type:   Image     Comment:   External Document

## 2010-03-28 NOTE — Miscellaneous (Signed)
Summary: refill  Clinical Lists Changes  Medications: Rx of OMEPRAZOLE 20 MG  TBEC (OMEPRAZOLE) Take two tablets by mouth once daily;  #60 Each x 4;  Signed;  Entered by: Kate Sable;  Authorized by: Tula Nakayama MD;  Method used: Electronically to [facility]*, [place], [place], [place], [place]  [address], Ph: [phone], Fax: [phone] Rx of LOTENSIN HCT 20-12.5 MG TABS (BENAZEPRIL-HYDROCHLOROTHIAZIDE) ONE TAB by mouth QD;  #30 x 4;  Signed;  Entered by: Kate Sable;  Authorized by: Tula Nakayama MD;  Method used: Electronically to [facility]*, [place], [place], [place], [place]  [address], Ph: [phone], Fax: [phone] Rx of LORATADINE 10 MG  TABS (LORATADINE) Take 1 tablet by mouth once a day;  #30 x 4;  Signed;  Entered by: Kate Sable;  Authorized by: Tula Nakayama MD;  Method used: Electronically to [facility]*, [place], [place], [place], [place]  [address], Ph: [phone], Fax: [phone] Rx of CRESTOR 40 MG TABS (ROSUVASTATIN CALCIUM) Take 1 tab by mouth at bedtime;  #30 x 4;  Signed;  Entered by: Kate Sable;  Authorized by: Tula Nakayama MD;  Method used: Electronically to [facility]*, [place], [place], [place], [place]  [address], Ph: [phone], Fax: [phone]    Prescriptions: CRESTOR 40 MG TABS (ROSUVASTATIN CALCIUM) Take 1 tab by mouth at bedtime  #30 x 4   Entered by:   Kate Sable   Authorized by:   Tula Nakayama MD   Signed by:   Kate Sable on 02/26/2009   Method used:   Electronically to        [facility] (retail)       [place] [address] [place], [place]  [address]       Ph: [phone]       Fax: [phone]   RxIDYU:[id number] LORATADINE 10 MG  TABS (LORATADINE) Take 1 tablet by mouth once a day  #30 x 4   Entered by:   Kate Sable   Authorized by:   Tula Nakayama MD   Signed by:    Kate Sable on 02/26/2009   Method used:   Electronically to        [facility] (retail)       [place] [address] [place], [place]  [address]       Ph: [phone]       Fax: [phone]   RxIDOG:[id number] LOTENSIN HCT 20-12.5 MG TABS (BENAZEPRIL-HYDROCHLOROTHIAZIDE) ONE TAB by mouth QD  #30 x 4   Entered by:   Kate Sable   Authorized by:   Tula Nakayama MD   Signed by:   Kate Sable on 02/26/2009   Method used:   Electronically to        [facility] (retail)       [place] [address]       [place], [place]  [address]       Ph: [phone]       Fax: [phone]   [id number]:[id number] OMEPRAZOLE 20 MG  TBEC (OMEPRAZOLE) Take two tablets by mouth once daily  #60 Each x 4   Entered by:   Kate Sable   Authorized by:   Joycelyn Schmid  Navea Woodrow MD   Signed by:   Kate Sable on 02/26/2009   Method used:   Electronically to        [facility] (retail)       [place] [address]       [place], [place]  [address]       Ph: [phone]       Fax: [phone]   RxID:   [id number]

## 2010-03-28 NOTE — Miscellaneous (Signed)
Summary: Home Care Report  Home Care Report   Imported By: Dierdre Harness 06/01/2009 10:17:02  _____________________________________________________________________  External Attachment:    Type:   Image     Comment:   External Document

## 2010-03-28 NOTE — Assessment & Plan Note (Signed)
Summary: recert/rrs   Allergies: No Known Drug Allergies   Complete Medication List: 1)  Loratadine 10 Mg Tabs (Loratadine) .... Take 1 tablet by mouth once a day 2)  Lamictal 100 Mg Tabs (Lamotrigine) .... Take 1 tablet by mouth once a day 3)  Keppra 750 Mg Tabs (Levetiracetam) .... Take two tablets by mouth twice daily 4)  Omeprazole 20 Mg Tbec (Omeprazole) .... Take two tablets by mouth once daily 5)  Aspirin Ec 325 Mg Tbec (Aspirin) .... Take 1 tablet by mouth once a day 6)  Fluoxetine Hcl 10 Mg Caps (Fluoxetine hcl) .... Take 1 capsule by mouth once a day 7)  Lotensin Hct 20-12.5 Mg Tabs (Benazepril-hydrochlorothiazide) .... One tab by mouth qd 8)  Crestor 40 Mg Tabs (Rosuvastatin calcium) .... Take 1 tab by mouth at bedtime 9)  Miralax Powd (Polyethylene glycol 3350) .Marland KitchenMarland KitchenMarland Kitchen 17 grams in 8 ounces of water daily recertification info reviewed and signed

## 2010-03-28 NOTE — Miscellaneous (Signed)
Summary: Home Care Report  Home Care Report   Imported By: Dierdre Harness 10/02/2009 09:38:24  _____________________________________________________________________  External Attachment:    Type:   Image     Comment:   External Document

## 2010-03-28 NOTE — Letter (Signed)
Summary: 1ST MISSED LETTER  1ST MISSED LETTER   Imported By: Dierdre Harness 02/13/2010 13:06:38  _____________________________________________________________________  External Attachment:    Type:   Image     Comment:   External Document

## 2010-03-28 NOTE — Progress Notes (Signed)
Summary: [facility]   Imported By: Dierdre Harness 04/19/2009 10:54:38  _____________________________________________________________________  External Attachment:    Type:   Image     Comment:   External Document

## 2010-03-28 NOTE — Miscellaneous (Signed)
Summary: Home Care Report  Home Care Report   Imported By: Dierdre Harness 01/22/2010 12:00:29  _____________________________________________________________________  External Attachment:    Type:   Image     Comment:   External Document

## 2010-03-28 NOTE — Letter (Signed)
Summary: Letter / [facility] AN APPT  Letter / [facility] AN APPT   Imported By: Dierdre Harness 06/26/2009 14:18:19  _____________________________________________________________________  External Attachment:    Type:   Image     Comment:   External Document

## 2010-03-28 NOTE — Progress Notes (Signed)
Summary: COLD  Phone Note Call from Patient   Summary of Call: HAS A COLD CAN NOT HARDLY BREATHE  DOES NOT KNOW ABOUT FEVER STOPPED UP AT NIGHT AND CAN NOT SLEEP NOSE HURTING EYE RUNNY WATER  CALL BACK AT [phone] Initial call taken by: Dierdre Harness,  February 11, 2010 10:05 AM  Follow-up for Phone Call        first available ov or work in tomorrow, if none this week, urgent care Follow-up by: Baldomero Lamy LPN,  December 19, 624THL 11:55 AM  Additional Follow-up for Phone Call Additional follow up Details #1::        appt.12.20.11 @ 3:45 patient aware Additional Follow-up by: Dierdre Harness,  February 11, 2010 2:23 PM

## 2010-03-28 NOTE — Progress Notes (Signed)
Summary: [facility]   Imported By: Dierdre Harness 03/21/2009 09:42:19  _____________________________________________________________________  External Attachment:    Type:   Image     Comment:   External Document

## 2010-03-28 NOTE — Assessment & Plan Note (Signed)
Summary: recert   Allergies: No Known Drug Allergies   Complete Medication List: 1)  Loratadine 10 Mg Tabs (Loratadine) .... Take 1 tablet by mouth once a day 2)  Lamictal 100 Mg Tabs (Lamotrigine) .... Take 1 tablet by mouth once a day 3)  Keppra 750 Mg Tabs (Levetiracetam) .... Take two tablets by mouth twice daily 4)  Omeprazole 20 Mg Tbec (Omeprazole) .... Take two tablets by mouth once daily 5)  Fluoxetine Hcl 10 Mg Caps (Fluoxetine hcl) .... Take 1 capsule by mouth once a day 6)  Lotensin Hct 20-12.5 Mg Tabs (Benazepril-hydrochlorothiazide) .... One tab by mouth once daily. 7)  Simvastatin 40 Mg Tabs (Simvastatin) .... Take 1 tab by mouth at bedtime 8)  Oscal 500/200 D-3 500-200 Mg-unit Tabs (Calcium-vitamin d) .... Take 1 tablet by mouth three times a day  Other Orders: Re-certification of Home Health [phone])   Orders Added: 1)  Re-certification of [place] [id number]

## 2010-04-01 ENCOUNTER — Encounter: Payer: Self-pay | Admitting: Family Medicine

## 2010-04-11 NOTE — Miscellaneous (Signed)
Summary: recert  recert   Imported By: Dierdre Harness 04/01/2010 16:41:14  _____________________________________________________________________  External Attachment:    Type:   Image     Comment:   External Document

## 2010-04-11 NOTE — Miscellaneous (Signed)
Summary: recert  recert   Imported By: Dierdre Harness 04/01/2010 16:40:57  _____________________________________________________________________  External Attachment:    Type:   Image     Comment:   External Document

## 2010-04-25 ENCOUNTER — Other Ambulatory Visit: Payer: Self-pay | Admitting: Family Medicine

## 2010-04-25 ENCOUNTER — Encounter (INDEPENDENT_AMBULATORY_CARE_PROVIDER_SITE_OTHER): Payer: Medicaid Other | Admitting: Family Medicine

## 2010-04-25 ENCOUNTER — Encounter: Payer: Self-pay | Admitting: Family Medicine

## 2010-04-25 ENCOUNTER — Other Ambulatory Visit (HOSPITAL_COMMUNITY)
Admission: RE | Admit: 2010-04-25 | Discharge: 2010-04-25 | Disposition: A | Discharge status: Home / Self Care | Payer: Medicaid Other | Source: Ambulatory Visit | Attending: Family Medicine | Admitting: Family Medicine

## 2010-04-25 DIAGNOSIS — Z0000 Encounter for general adult medical examination without abnormal findings: Secondary | ICD-10-CM

## 2010-04-25 DIAGNOSIS — Z1211 Encounter for screening for malignant neoplasm of colon: Secondary | ICD-10-CM

## 2010-04-25 DIAGNOSIS — Z139 Encounter for screening, unspecified: Secondary | ICD-10-CM

## 2010-04-25 DIAGNOSIS — Z01419 Encounter for gynecological examination (general) (routine) without abnormal findings: Secondary | ICD-10-CM | POA: Insufficient documentation

## 2010-04-25 DIAGNOSIS — Z124 Encounter for screening for malignant neoplasm of cervix: Secondary | ICD-10-CM

## 2010-04-26 ENCOUNTER — Encounter: Payer: Self-pay | Admitting: Family Medicine

## 2010-05-01 ENCOUNTER — Other Ambulatory Visit: Payer: Self-pay | Admitting: Family Medicine

## 2010-05-01 LAB — BASIC METABOLIC PANEL
BUN: 32 mg/dL — ABNORMAL HIGH (ref 6–23)
CO2: 23 mEq/L (ref 19–32)
Calcium: 9.4 mg/dL (ref 8.4–10.5)
Chloride: 103 mEq/L (ref 96–112)
Creat: 1.43 mg/dL — ABNORMAL HIGH (ref 0.40–1.20)
Glucose, Bld: 90 mg/dL (ref 70–99)
Potassium: 4 mEq/L (ref 3.5–5.3)
Sodium: 143 mEq/L (ref 135–145)

## 2010-05-01 LAB — HEPATIC FUNCTION PANEL
ALT: 10 U/L (ref 0–35)
AST: 20 U/L (ref 0–37)
Albumin: 4.3 g/dL (ref 3.5–5.2)
Alkaline Phosphatase: 128 U/L — ABNORMAL HIGH (ref 39–117)
Bilirubin, Direct: 0.2 mg/dL (ref 0.0–0.3)
Indirect Bilirubin: 0.3 mg/dL (ref 0.0–0.9)
Total Bilirubin: 0.5 mg/dL (ref 0.3–1.2)
Total Protein: 8.1 g/dL (ref 6.0–8.3)

## 2010-05-01 LAB — LIPID PANEL
Cholesterol: 206 mg/dL — ABNORMAL HIGH (ref 0–200)
HDL: 51 mg/dL (ref >39)
LDL Cholesterol: 140 mg/dL — ABNORMAL HIGH (ref 0–99)
Total CHOL/HDL Ratio: 4 Ratio
Triglycerides: 75 mg/dL (ref <150)
VLDL: 15 mg/dL (ref 0–40)

## 2010-05-02 LAB — CONVERTED CEMR LAB
ALT: 10 U/L
AST: 20 U/L
Albumin: 4.3 g/dL
Alkaline Phosphatase: 128 U/L — ABNORMAL HIGH
BUN: 32 mg/dL — ABNORMAL HIGH
Bilirubin, Direct: 0.2 mg/dL
CO2: 23 meq/L
Calcium: 9.4 mg/dL
Chloride: 103 meq/L
Cholesterol: 206 mg/dL — ABNORMAL HIGH
Creatinine, Ser: 1.43 mg/dL — ABNORMAL HIGH
Glucose, Bld: 90 mg/dL
HDL: 51 mg/dL
Indirect Bilirubin: 0.3 mg/dL
LDL Cholesterol: 140 mg/dL — ABNORMAL HIGH
Potassium: 4 meq/L
Sodium: 143 meq/L
Total Bilirubin: 0.5 mg/dL
Total CHOL/HDL Ratio: 4
Total Protein: 8.1 g/dL
Triglycerides: 75 mg/dL
VLDL: 15 mg/dL

## 2010-05-02 NOTE — Letter (Signed)
Summary: Letter on mammo  Letter on mammo   Imported By: Lenn Cal 04/26/2010 14:14:31  _____________________________________________________________________  External Attachment:    Type:   Image     Comment:   External Document

## 2010-05-07 NOTE — Assessment & Plan Note (Signed)
Summary: physical   Vital Signs:  Patient profile:   55 year old female Menstrual status:  hysterectomy Height:      64 inches Weight:      236 pounds BMI:     40.66 O2 Sat:      98 % Pulse rate:   87 / minute Pulse rhythm:   regular Resp:     16 per minute BP sitting:   122 / 80  (left arm) Cuff size:   large  Vitals Entered By: Kate Sable LPN (March  1, [id number] AM)  Nutrition Counseling: Patient's BMI is greater than 25 and therefore counseled on weight management options. CC: CPE  Vision Screening:Left eye w/o correction: 20 / 40 Right Eye w/o correction: 20 / 40 Both eyes w/o correction:  20/ 30  Color vision testing: normal      Vision Entered By: Kate Sable LPN (March  1, [id number] [id number] AM)   CC:  CPE.  History of Present Illness: Reports  that she has been well. Denies recent fever or chills. Denies sinus pressure, nasal congestion , ear pain or sore throat. Denies chest congestion, or cough productive of sputum. Denies chest pain, palpitations, PND, orthopnea or leg swelling. Denies abdominal pain, nausea, vomitting, diarrhea or constipation. Denies change in bowel movements or bloody stool. Denies dysuria , frequency, incontinence or hesitancy. Denies  joint pain, swelling, or reduced mobility. Denies headaches, vertigo, seizures. Denies depression, anxiety or insomnia. Denies  rash, lesions, or itch.     Current Medications (verified): 1)  Loratadine 10 Mg  Tabs (Loratadine) .... Take 1 Tablet By Mouth Once A Day 2)  Lamictal 100 Mg  Tabs (Lamotrigine) .... Take 1 Tablet By Mouth Once A Day 3)  Keppra 750 Mg  Tabs (Levetiracetam) .... Take Two Tablets By Mouth Twice Daily 4)  Omeprazole 20 Mg  Tbec (Omeprazole) .... Take Two Tablets By Mouth Once Daily 5)  Fluoxetine Hcl 10 Mg Caps (Fluoxetine Hcl) .... Take 1 Capsule By Mouth Once A Day 6)  Lotensin Hct 20-12.5 Mg Tabs (Benazepril-Hydrochlorothiazide) .... One Tab By Mouth Once Daily. 7)   Simvastatin 40 Mg Tabs (Simvastatin) .... Take 1 Tab By Mouth At Bedtime 8)  Oscal 500/200 D-3 500-200 Mg-Unit Tabs (Calcium-Vitamin D) .... Take 1 Tablet By Mouth Three Times A Day  Allergies (verified): No Known Drug Allergies  Review of Systems      See HPI General:  Complains of fatigue. Eyes:  Denies discharge and red eye. Endo:  Denies cold intolerance, excessive hunger, excessive thirst, excessive urination, and heat intolerance. Heme:  Denies abnormal bruising, bleeding, and enlarge lymph nodes. Allergy:  Complains of seasonal allergies; denies hives or rash, itching eyes, and persistent infections.  Physical Exam  General:  Well-developed,obese,in no acute distress; alert,appropriate and cooperative throughout examination Head:  Normocephalic and atraumatic without obvious abnormalities. No apparent alopecia or balding. Eyes:  No corneal or conjunctival inflammation noted. EOMI. Perrla. Funduscopic exam benign, without hemorrhages, exudates or papilledema. Vision grossly normal. Ears:  External ear exam shows no significant lesions or deformities.  Otoscopic examination reveals clear canals, tympanic membranes are intact bilaterally without bulging, retraction, inflammation or discharge. Hearing is grossly normal bilaterally. Nose:  External nasal examination shows no deformity or inflammation. Nasal mucosa are pink and moist without lesions or exudates. Mouth:  pharynx pink and moist, poor dentition, and teeth missing.   Neck:  No deformities, masses, or tenderness noted. Chest Wall:  No deformities, masses, or tenderness noted.  Breasts:  No mass, nodules, thickening, tenderness, bulging, retraction, inflamation, nipple discharge or skin changes noted.   Lungs:  Normal respiratory effort, chest expands symmetrically. Lungs are clear to auscultation, no crackles or wheezes. Heart:  Normal rate and regular rhythm. S1 and S2 normal without gallop, murmur, click, rub or other extra  sounds. Abdomen:  Bowel sounds positive,abdomen soft and non-tender without masses, organomegaly or hernias noted. Rectal:  No external abnormalities noted. Normal sphincter tone. No rectal masses or tenderness. Genitalia:  normal introitus, no external lesions, no vaginal discharge, and no adnexal masses or tenderness.  Uterus absent  Msk:  No deformity or scoliosis noted of thoracic or lumbar spine.   Pulses:  R and L carotid,radial,femoral,dorsalis pedis and posterior tibial pulses are full and equal bilaterally Extremities:  No clubbing, cyanosis, edema, or deformity noted with normal full range of motion of all joints.   Neurologic:  No cranial nerve deficits noted. Station and gait are normal. Plantar reflexes are down-going bilaterally. DTRs are symmetrical throughout. Sensory, motor and coordinative functions appear intact. Skin:  Intact without suspicious lesions or rashes Cervical Nodes:  No lymphadenopathy noted Axillary Nodes:  No palpable lymphadenopathy Inguinal Nodes:  No significant adenopathy Psych:  Cognition and judgment appear intact. Alert and cooperative with normal attention span and concentration. No apparent delusions, illusions, hallucinations   Impression & Recommendations:  Problem # 1:  OBESITY (ICD-278.00) Assessment Deteriorated  Ht: 64 (04/25/2010)   Wt: 236 (04/25/2010)   BMI: 40.66 (04/25/2010) therapeutic lifestyle change discussed and encouraged  Problem # 2:  SEIZURE DISORDER (ICD-780.39) Assessment: Improved  Her updated medication list for this problem includes:    Lamictal 100 Mg Tabs (Lamotrigine) .Marland Kitchen... Take 1 tablet by mouth once a day    Keppra 750 Mg Tabs (Levetiracetam) .Marland Kitchen... Take two tablets by mouth twice daily treated by neurology  Problem # 3:  HYPERTENSION (ICD-401.9) Assessment: Unchanged  Her updated medication list for this problem includes:    Lotensin Hct 20-12.5 Mg Tabs (Benazepril-hydrochlorothiazide) ..... One tab by mouth  once daily.  Orders: T-Basic Metabolic Panel ([id number])  BP today: 122/80 Prior BP: 124/80 (12/26/2009)  Labs Reviewed: K+: 4.3 (12/26/2009) Creat: : 1.51 (12/26/2009)   Chol: 180 (12/26/2009)   HDL: 45 (12/26/2009)   LDL: 114 (12/26/2009)   TG: 105 (12/26/2009)  Problem # 4:  HYPERLIPIDEMIA (ICD-272.4) Assessment: Comment Only  Her updated medication list for this problem includes:    Simvastatin 40 Mg Tabs (Simvastatin) .Marland Kitchen... Take 1 tab by mouth at bedtime Low fat dietdiscussed and encouraged  Orders: T-Lipid Profile [id number]) T-Hepatic Function [id number])  Labs Reviewed: SGOT: 17 (12/26/2009)   SGPT: <8 U/L (12/26/2009)   HDL:45 (12/26/2009), 46 (08/16/2009)  LDL:114 (12/26/2009), 116 (08/16/2009)  Chol:180 (12/26/2009), 179 (08/16/2009)  Trig:105 (12/26/2009), 84 (08/16/2009)  Complete Medication List: 1)  Loratadine 10 Mg Tabs (Loratadine) .... Take 1 tablet by mouth once a day 2)  Lamictal 100 Mg Tabs (Lamotrigine) .... Take 1 tablet by mouth once a day 3)  Keppra 750 Mg Tabs (Levetiracetam) .... Take two tablets by mouth twice daily 4)  Omeprazole 20 Mg Tbec (Omeprazole) .... Take two tablets by mouth once daily 5)  Fluoxetine Hcl 10 Mg Caps (Fluoxetine hcl) .... Take 1 capsule by mouth once a day 6)  Lotensin Hct 20-12.5 Mg Tabs (Benazepril-hydrochlorothiazide) .... One tab by mouth once daily. 7)  Simvastatin 40 Mg Tabs (Simvastatin) .... Take 1 tab by mouth at bedtime 8)  Oscal 500/200 D-3 500-200 Mg-unit Tabs (  Calcium-vitamin d) .... Take 1 tablet by mouth three times a day  Other Orders: Hemoccult Guaiac-1 spec.(in office) ([id number]) Pap Smear [id number]) Future Orders: Radiology Referral (Radiology) ... 04/26/2010  Patient Instructions: 1)  Please schedule a follow-up appointment in 4 months. 2)  It is important that you exercise regularly at least 20 minutes 5 times a week. If you develop chest pain, have severe difficulty breathing, or feel very tired ,  stop exercising immediately and seek medical attention. 3)  You need to lose weight. Consider a lower calorie diet and regular exercise.  4)  BMP prior to visit, ICD-9: 5)  Hepatic Panel prior to visit, ICD-9:  fasting asap. 6)  Lipid 7)  No med changes at this time Prescriptions: FLUOXETINE HCL 10 MG CAPS (FLUOXETINE HCL) Take 1 capsule by mouth once a day  #30 Each x 3   Entered by:   Baldomero Lamy LPN   Authorized by:   Tula Nakayama MD   Signed by:   Baldomero Lamy LPN on 579FGE   Method used:   Electronically to        [facility] (retail)       [place] [address] [place], [place]  [address]       Ph: [phone]       Fax: [phone]   [id number] OMEPRAZOLE 20 MG  TBEC (OMEPRAZOLE) Take two tablets by mouth once daily  #60 Each x 3   Entered by:   Baldomero Lamy LPN   Authorized by:   Tula Nakayama MD   Signed by:   Baldomero Lamy LPN on 579FGE   Method used:   Electronically to        [facility] (retail)       [place] [address] [place], [place]  [address]       Ph: [phone]       Fax: [phone]   [id number] LORATADINE 10 MG  TABS (LORATADINE) Take 1 tablet by mouth once a day  #30 Each x 3   Entered by:   Baldomero Lamy LPN   Authorized by:   Tula Nakayama MD   Signed by:   Baldomero Lamy LPN on 579FGE   Method used:   Electronically to        [facility] (retail)       [place] [address]       [place], [place]  [address]       Ph: [phone]       Fax: [phone]   RxID:   [id number]    Orders Added: 1)  Est. Patient 40-64 years [id number] 2)  T-Basic Metabolic Panel [id number] 3)  T-Lipid Profile [id number] 4)  T-Hepatic Function [id number] 5)  Hemoccult Guaiac-1 spec.(in office) [id number] 6)  Pap Smear [id number] 7)  Radiology Referral [Radiology]

## 2010-05-13 ENCOUNTER — Ambulatory Visit (HOSPITAL_COMMUNITY): Payer: Medicaid Other

## 2010-06-14 ENCOUNTER — Emergency Department (HOSPITAL_COMMUNITY)
Admission: EM | Admit: 2010-06-14 | Discharge: 2010-06-15 | Disposition: A | Discharge status: Home / Self Care | Payer: Medicaid Other | Attending: Emergency Medicine | Admitting: Emergency Medicine

## 2010-06-14 ENCOUNTER — Emergency Department (HOSPITAL_COMMUNITY): Payer: Medicaid Other

## 2010-06-14 DIAGNOSIS — Y92009 Unspecified place in unspecified non-institutional (private) residence as the place of occurrence of the external cause: Secondary | ICD-10-CM | POA: Insufficient documentation

## 2010-06-14 DIAGNOSIS — Z79899 Other long term (current) drug therapy: Secondary | ICD-10-CM | POA: Insufficient documentation

## 2010-06-14 DIAGNOSIS — E7800 Pure hypercholesterolemia, unspecified: Secondary | ICD-10-CM | POA: Insufficient documentation

## 2010-06-14 DIAGNOSIS — K219 Gastro-esophageal reflux disease without esophagitis: Secondary | ICD-10-CM | POA: Insufficient documentation

## 2010-06-14 DIAGNOSIS — Y998 Other external cause status: Secondary | ICD-10-CM | POA: Insufficient documentation

## 2010-06-14 DIAGNOSIS — I10 Essential (primary) hypertension: Secondary | ICD-10-CM | POA: Insufficient documentation

## 2010-06-14 DIAGNOSIS — G40802 Other epilepsy, not intractable, without status epilepticus: Secondary | ICD-10-CM | POA: Insufficient documentation

## 2010-06-14 DIAGNOSIS — S92309A Fracture of unspecified metatarsal bone(s), unspecified foot, initial encounter for closed fracture: Secondary | ICD-10-CM | POA: Insufficient documentation

## 2010-06-14 DIAGNOSIS — W010XXA Fall on same level from slipping, tripping and stumbling without subsequent striking against object, initial encounter: Secondary | ICD-10-CM | POA: Insufficient documentation

## 2010-06-17 ENCOUNTER — Telehealth: Payer: Self-pay | Admitting: Family Medicine

## 2010-06-17 DIAGNOSIS — S82891A Other fracture of right lower leg, initial encounter for closed fracture: Secondary | ICD-10-CM

## 2010-06-17 NOTE — Telephone Encounter (Signed)
pls refer to Dr Aline Brochure for right fracture ,

## 2010-06-18 NOTE — Telephone Encounter (Signed)
Forwarded to [employer]

## 2010-06-20 ENCOUNTER — Ambulatory Visit (INDEPENDENT_AMBULATORY_CARE_PROVIDER_SITE_OTHER): Payer: Medicaid Other | Admitting: Orthopedic Surgery

## 2010-06-20 ENCOUNTER — Encounter: Payer: Self-pay | Admitting: Orthopedic Surgery

## 2010-06-20 DIAGNOSIS — S92309A Fracture of unspecified metatarsal bone(s), unspecified foot, initial encounter for closed fracture: Secondary | ICD-10-CM

## 2010-06-20 NOTE — Progress Notes (Signed)
Chief complaint pain in the RIGHT foot.  Fracture 5th metatarsal.  Injury date April 20  Current medication ibuprofen.  Pain is 4/10 tends to come and go it is worse with ambulation. Associated signs and symptoms are swelling and bruising.  Review of systems were all reviewed. They were all negative except for history of seizures and dizziness.  Medical problems include seizure hypertension,  She's had an abscess removed from her RIGHT axilla.  Her family physician is Dr. Moshe Cipro  Is no family history of any significant diseases.  She is single. Her height. We completed was the 9th grade.  Examination Gen. Appearance medium to large frame.  He is oriented x3. Her mood and affect are normal.  She ambulates in regular shoes with a slight limp, favoring the RIGHT lower extremity.  Her extremities show no abnormalities on inspection, there are no joint contractures. All joints are reduced without subluxation. Muscle tone and skin are normal.  The RIGHT lower extremity is tender over the 5th metatarsal with a small amount of swelling. There. Ankle range of motion is normal. Muscle tone and the foot is normal without atrophy. Skin is dry and intact. Pulse and temperature are normal. Sensation is normal. There are no pathologic reflexes. Coordination and balance are normal.  The x-rays from the hospital show a nondisplaced 5th metatarsal fracture.  Recommended short leg weightbearing cast for 6 weeks.  X-ray in 6 weeks out of plaster.

## 2010-06-25 ENCOUNTER — Other Ambulatory Visit: Payer: Self-pay | Admitting: Family Medicine

## 2010-07-12 NOTE — H&P (Signed)
NAMEJAYLYNN, [name]               ACCOUNT NO.:  [id number]   MEDICAL RECORD NO.:  [id number]          PATIENT TYPE:  AMB   LOCATION:  DAY                           FACILITY:  [facility]   PHYSICIAN:  Leroy C. Tamala Julian, M.D.   DATE OF BIRTH:  1956-01-21   DATE OF ADMISSION:  DATE OF DISCHARGE:  LH                                HISTORY & PHYSICAL   A 55 year old female with a history of recurrent abdominal pain, nausea,  anorexia without vomiting or diarrhea.  Upper abdominal ultrasound is  unremarkable.  HIDA scan is negative.  Colonoscopy is normal.  The patient  does have a history of constipation.  There is a history of weight loss.  She is having upper endoscopy for evaluation of chronic upper abdominal  pain, weight loss.   PAST HISTORY:  1.  Hypertension.  2.  Seizure disorder.   MEDICATIONS:  1.  Dilantin 100 mg two daily.  2.  Diovan/HCT 160/12.5 daily.  3.  Lotrel 5/20 daily.  4.  Lipitor 20 mg daily.  5.  Aspirin 325 mg daily.   PHYSICAL EXAMINATION:  VITAL SIGNS:  Blood pressure 121/65, pulse 76,  respirations 20, weight 153 pounds.  HEENT:  Unremarkable.  NECK:  Supple.  No JVD, bruit, adenopathy, or thyromegaly.  CHEST:  Clear to auscultation.  HEART:  Regular rate and rhythm without murmur, gallop, or rub.  ABDOMEN:  Soft, nontender.  No masses.  RECTAL:  Normal.  Normal sphincter tone.  No masses.  EXTREMITIES:  No cyanosis, clubbing, or edema.  NEUROLOGIC:  No focal motor, sensory, or cerebellar deficit.   IMPRESSION:  1.  Recurrent abdominal pain with weight loss.  2.  Chronic constipation.  3.  Hypertension.  4.  Seizure disorder.   PLAN:  Upper endoscopy.      Vernon Prey. Tamala Julian, M.D.  Electronically Signed     LCS/MEDQ  D:  02/26/2005  T:  02/26/2005  Job:  [id number]

## 2010-07-12 NOTE — H&P (Signed)
[name], [name]               ACCOUNT NO.:  [id number]   MEDICAL RECORD NO.:  [id number]          PATIENT TYPE:  AMB   LOCATION:  DAY                           FACILITY:  [facility]   PHYSICIAN:  Leane Para C. Tamala Julian, M.D.   DATE OF BIRTH:  October 10, 1955   DATE OF ADMISSION:  DATE OF DISCHARGE:  LH                                HISTORY & PHYSICAL   HISTORY OF PRESENT ILLNESS:  The patient is a 55 year old female with  history of rectal bleeding.  She has a prior history of hemorrhoids and has  not had any hemorrhoidal swelling or tenderness.  She underwent colonoscopy  in 2002 and was only found to have internal and external hemorrhoids.  Her  bleeding has become more frequent.  The patient is scheduled to have  colonoscopy prior to the recommendation for possible hemorrhoidectomy.  If  colonoscopy is normal, then hemorrhoidectomy will be advised.  There is no  history of diarrhea or history of abdominal pain and no nausea or vomiting.   PAST MEDICAL HISTORY:  1.  She has hypertension.  2.  Seizure disorder.  3.  Hyperlipidemia.  4.  Seasonal allergies.   PAST SURGICAL HISTORY:  1.  Total abdominal hysterectomy with bilateral salpingo-oophorectomy.  2.  Right partial mastectomy.   MEDICATIONS:  1.  Diovan HCT 160/25 daily.  2.  Aspirin 325 mg daily.  3.  Dilantin 100 mg b.i.d.  4.  Lipitor 10 mg at night.  5.  Lotrel 520 mg daily.  6.  Allegra 180 mg daily.   SOCIAL HISTORY:  She is disabled.  Single mother of two.  She does not  drink, smoke or use drugs.   PHYSICAL EXAMINATION:  VITAL SIGNS:  Blood pressure 108/60, pulse 90,  respirations 18, weight 158 pounds.  HEENT:  Unremarkable except for gingival hyperplasia.  NECK:  Supple.  No JVD or bruit.  CHEST:  Clear to auscultation.  HEART:  Regular rate and rhythm without murmur, gallop or rub.  ABDOMEN:  Soft but diffuse tenderness.  Moderate tenderness in the left  lower quadrant.  No epigastric tenderness.  EXTREMITIES:  No  cyanosis, clubbing or edema.  NEUROLOGIC:  No focal motor, sensory or cerebellar deficits.   IMPRESSION:  1.  History of recurrent rectal bleeding.  2.  Seizure disorder.  3.  Hypertension.  4.  Low back pain.  5.  Seasonal allergies.   PLAN:  Total colonoscopy.      LCS/MEDQ  D:  05/08/2004  T:  05/09/2004  Job:  [id number]

## 2010-07-12 NOTE — H&P (Signed)
[name], [name]               ACCOUNT NO.:  [id number]   MEDICAL RECORD NO.:  [id number]          PATIENT TYPE:  AMB   LOCATION:  DAY                           FACILITY:  [facility]   PHYSICIAN:  Leroy C. Tamala Julian, M.D.   DATE OF BIRTH:  1955-10-27   DATE OF ADMISSION:  DATE OF DISCHARGE:  LH                                HISTORY & PHYSICAL   This is a 55 year old female with a history of burning in her abdomen. She  denies nausea or vomiting. She was scheduled for upper endoscopy in  December, 2006 but did not show up.  She has lost 3 pounds. She is H. pylori  positive and has been treated with a Prevpac but her symptoms persist.  She  is scheduled for upper endoscopy.   PAST MEDICAL HISTORY:  1.  Hypertension.  2.  Hyperlipidemia.  3.  Seizure disorder.  4.  Mental retardation.   MEDICATIONS:  1.  Keppra 500 mg q.a.m. and 1000 mg in the evening.  2.  Lotrel 5/20 daily.  3.  Diovan HCT 160/25 daily.  4.  Lipitor 20 mg q.h.s.  5.  Dilantin 100 mg b.i.d.  6.  Aspirin 325 mg daily.   PHYSICAL EXAMINATION:  Blood pressure 108/66, pulse 70, respirations 20.  Weight 150 pounds.  HEENT:  Unremarkable except for poor dentition.  NECK:  Supple, no JVD, bruit, adenopathy or thyromegaly.  CHEST:  Clear to auscultation.  HEART:  Regular rate and rhythm without murmurs, rubs, or gallops.  ABDOMEN:  Soft, nontender, no masses.  EXTREMITIES:  No clubbing, cyanosis or edema.  NEUROLOGIC EXAM:  No focal motor, sensory or cerebellar deficit.   IMPRESSION:  1.  Recurrent abdominal pain with H. pylori positivity, symptomatic despite      treatment.  2.  Progressive weight loss.  3.  Chronic constipation.   PLAN:  Upper endoscopy.      Vernon Prey. Tamala Julian, M.D.  Electronically Signed     LCS/MEDQ  D:  08/13/2005  T:  08/13/2005  Job:  [id number]

## 2010-07-26 ENCOUNTER — Other Ambulatory Visit: Payer: Self-pay | Admitting: Family Medicine

## 2010-08-01 ENCOUNTER — Ambulatory Visit: Payer: Medicaid Other | Admitting: Orthopedic Surgery

## 2010-08-06 ENCOUNTER — Encounter: Payer: Self-pay | Admitting: Orthopedic Surgery

## 2010-08-06 ENCOUNTER — Ambulatory Visit (INDEPENDENT_AMBULATORY_CARE_PROVIDER_SITE_OTHER): Payer: Medicaid Other | Admitting: Orthopedic Surgery

## 2010-08-06 DIAGNOSIS — S92309A Fracture of unspecified metatarsal bone(s), unspecified foot, initial encounter for closed fracture: Secondary | ICD-10-CM

## 2010-08-06 NOTE — Progress Notes (Signed)
Fracture 5th metatarsal. Right foot  Injury date April 20  Metatarsal fracture. #5 proximal.  Treated with short leg cast.  X-rays for today.  Clinical exam shows no tenderness or deformity at the fracture site.  X-rays show fracture healing.  Patient is to return to normal activity.

## 2010-08-06 NOTE — Patient Instructions (Signed)
Return to normal shoe wear and normal activity

## 2010-08-06 NOTE — Progress Notes (Signed)
3 views, RIGHT foot.  Reason for x-ray, fracture, RIGHT 5th metatarsal.  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  No deformity or malalignment is seen.  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 Clinical exam. Correlation has been completed

## 2010-08-25 ENCOUNTER — Other Ambulatory Visit: Payer: Self-pay | Admitting: Family Medicine

## 2010-09-04 ENCOUNTER — Encounter: Payer: Self-pay | Admitting: Family Medicine

## 2010-09-10 ENCOUNTER — Encounter: Payer: Self-pay | Admitting: Family Medicine

## 2010-09-10 ENCOUNTER — Ambulatory Visit (INDEPENDENT_AMBULATORY_CARE_PROVIDER_SITE_OTHER): Payer: Medicaid Other | Admitting: Family Medicine

## 2010-09-10 VITALS — BP 110/80 | HR 88 | Resp 16 | Ht 65.5 in | Wt 228.1 lb

## 2010-09-10 DIAGNOSIS — I10 Essential (primary) hypertension: Secondary | ICD-10-CM

## 2010-09-10 DIAGNOSIS — F3289 Other specified depressive episodes: Secondary | ICD-10-CM

## 2010-09-10 DIAGNOSIS — F329 Major depressive disorder, single episode, unspecified: Secondary | ICD-10-CM

## 2010-09-10 DIAGNOSIS — E669 Obesity, unspecified: Secondary | ICD-10-CM

## 2010-09-10 DIAGNOSIS — R569 Unspecified convulsions: Secondary | ICD-10-CM

## 2010-09-10 DIAGNOSIS — E785 Hyperlipidemia, unspecified: Secondary | ICD-10-CM

## 2010-09-10 DIAGNOSIS — E559 Vitamin D deficiency, unspecified: Secondary | ICD-10-CM

## 2010-09-10 MED ORDER — LORATADINE 10 MG PO TABS: ORAL_TABLET | ORAL | Status: DC

## 2010-09-10 MED ORDER — OMEPRAZOLE 20 MG PO CPDR: DELAYED_RELEASE_CAPSULE | ORAL | Status: DC

## 2010-09-10 MED ORDER — BENAZEPRIL-HYDROCHLOROTHIAZIDE 20-12.5 MG PO TABS: ORAL_TABLET | ORAL | Status: DC

## 2010-09-10 MED ORDER — SIMVASTATIN 40 MG PO TABS: ORAL_TABLET | ORAL | Status: DC

## 2010-09-10 MED ORDER — FLUOXETINE HCL 10 MG PO CAPS: ORAL_CAPSULE | ORAL | Status: DC

## 2010-09-10 NOTE — Assessment & Plan Note (Signed)
Uncontrolled. Medication compliance addressed. Commitment to regular exercise, and healthy  eating habits with portion control discussed. DASH diet, and low fat diet discussed, and literature offered. No changes in medication at this time. Fasting labs due

## 2010-09-10 NOTE — Assessment & Plan Note (Signed)
Continue once weekly vit D until lab normalizes, pt needs calcium with D daily also

## 2010-09-10 NOTE — Assessment & Plan Note (Signed)
Improved and controlled on medication, no change

## 2010-09-10 NOTE — Progress Notes (Signed)
  Subjective:    Patient ID: [name], female    DOB: [birth year], 55 y.o.   MRN: [id number]  HPI The PT is here for follow up and re-evaluation of chronic medical conditions, medication management and review of recent lab and radiology data.  Preventive health is updated, specifically  Cancer screening,  and Immunization.   Questions or concerns regarding consultations or procedures which the PT has had in the interim are  addressed. The PT denies any adverse reactions to current medications since the last visit.  There are no new concerns.  There are no specific complaints . She has started dietary modification with a view to losing weight with success.      Review of Systems    Denies recent fever or chills. Denies sinus pressure, nasal congestion, ear pain or sore throat. Denies chest congestion, productive cough or wheezing. Denies chest pains, palpitations, paroxysmal nocturnal dyspnea, orthopnea and leg swelling Denies abdominal pain, nausea, vomiting,diarrhea or constipation.  Denies rectal bleeding or change in bowel movement. Denies dysuria, frequency, hesitancy or incontinence. Denies joint pain, swelling and limitation in mobility. Denies headaches, seizure, numbness, or tingling.Continues to follow with neurology Denies depression, anxiety or insomnia. Denies skin break down or rash.     Objective:   Physical Exam Patient alert and oriented and in no Cardiopulmonary distress.  HEENT: No facial asymmetry, EOMI, no sinus tenderness, TM's clear, Oropharynx pink and moist.  Neck supple no adenopathy.  Chest: Clear to auscultation bilaterally.  CVS: S1, S2 no murmurs, no S3.  ABD: Soft non tender. Bowel sounds normal.  Ext: No edema  MS: Adequate ROM spine, shoulders, hips and knees.  Skin: Intact, no ulcerations or rash noted.  Psych: Good eye contact, normal affect. Memory intact not anxious or depressed appearing.  CNS: CN 2-12 intact, power, tone and  sensation normal throughout.        Assessment & Plan:

## 2010-09-10 NOTE — Assessment & Plan Note (Signed)
Improved. Pt applauded on succesful weight loss through lifestyle change, and encouraged to continue same. Weight loss goal set for the next several months.  

## 2010-09-10 NOTE — Patient Instructions (Signed)
F/u in 59months.  It is important that you exercise regularly at least 30 minutes 5 times a week. If you develop chest pain, have severe difficulty breathing, or feel very tired, stop exercising immediately and seek medical attention    A healthy diet is rich in fruit, vegetables and whole grains. Poultry fish, nuts and beans are a healthy choice for protein rather then red meat. A low sodium diet and drinking 64 ounces of water daily is generally recommended. Oils and sweet should be limited. Carbohydrates especially for those who are diabetic or overweight, should be limited to 34-45 gram per meal. It is important to eat on a regular schedule, at least 3 times daily. Snacks should be primarily fruits, vegetables or nuts.   Fasting chem 7, lipid, hepatic today  Mammogram is past due you need to get this asap, we will schedule before you leave

## 2010-09-10 NOTE — Assessment & Plan Note (Signed)
Controlled, and followed by neurology

## 2010-09-10 NOTE — Assessment & Plan Note (Signed)
Controlled, no change in medication  

## 2010-09-11 LAB — BASIC METABOLIC PANEL
BUN: 25 mg/dL — ABNORMAL HIGH (ref 6–23)
CO2: 27 mEq/L (ref 19–32)
Calcium: 9.6 mg/dL (ref 8.4–10.5)
Chloride: 101 mEq/L (ref 96–112)
Creat: 1.61 mg/dL — ABNORMAL HIGH (ref 0.50–1.10)
Glucose, Bld: 85 mg/dL (ref 70–99)
Potassium: 5.2 mEq/L (ref 3.5–5.3)
Sodium: 138 mEq/L (ref 135–145)

## 2010-09-11 LAB — LIPID PANEL
Cholesterol: 201 mg/dL — ABNORMAL HIGH (ref 0–200)
HDL: 47 mg/dL (ref >39)
LDL Cholesterol: 136 mg/dL — ABNORMAL HIGH (ref 0–99)
Total CHOL/HDL Ratio: 4.3 Ratio
Triglycerides: 92 mg/dL (ref <150)
VLDL: 18 mg/dL (ref 0–40)

## 2010-09-11 LAB — HEPATIC FUNCTION PANEL
ALT: 11 U/L (ref 0–35)
AST: 22 U/L (ref 0–37)
Albumin: 4.2 g/dL (ref 3.5–5.2)
Alkaline Phosphatase: 127 U/L — ABNORMAL HIGH (ref 39–117)
Bilirubin, Direct: <0.1 mg/dL (ref 0.0–0.3)
Total Bilirubin: 0.5 mg/dL (ref 0.3–1.2)
Total Protein: 8.8 g/dL — ABNORMAL HIGH (ref 6.0–8.3)

## 2010-09-20 ENCOUNTER — Ambulatory Visit (HOSPITAL_COMMUNITY)
Admission: RE | Admit: 2010-09-20 | Discharge: 2010-09-20 | Disposition: A | Discharge status: Home / Self Care | Payer: Medicaid Other | Source: Ambulatory Visit | Attending: Family Medicine | Admitting: Family Medicine

## 2010-09-20 DIAGNOSIS — Z1231 Encounter for screening mammogram for malignant neoplasm of breast: Secondary | ICD-10-CM | POA: Insufficient documentation

## 2010-09-20 DIAGNOSIS — Z139 Encounter for screening, unspecified: Secondary | ICD-10-CM

## 2010-09-24 ENCOUNTER — Other Ambulatory Visit: Payer: Self-pay | Admitting: Family Medicine

## 2010-09-24 DIAGNOSIS — R928 Other abnormal and inconclusive findings on diagnostic imaging of breast: Secondary | ICD-10-CM

## 2010-10-02 ENCOUNTER — Ambulatory Visit (HOSPITAL_COMMUNITY)
Admission: RE | Admit: 2010-10-02 | Discharge: 2010-10-02 | Disposition: A | Discharge status: Home / Self Care | Payer: Medicaid Other | Source: Ambulatory Visit | Attending: Family Medicine | Admitting: Family Medicine

## 2010-10-02 DIAGNOSIS — R928 Other abnormal and inconclusive findings on diagnostic imaging of breast: Secondary | ICD-10-CM

## 2010-11-28 LAB — URINALYSIS, ROUTINE W REFLEX MICROSCOPIC
Bilirubin Urine: NEGATIVE
Glucose, UA: NEGATIVE mg/dL
Ketones, ur: NEGATIVE mg/dL
Nitrite: NEGATIVE
Protein, ur: NEGATIVE mg/dL
Specific Gravity, Urine: 1.02 (ref 1.005–1.030)
Urobilinogen, UA: 0.2 mg/dL (ref 0.0–1.0)
pH: 5 (ref 5.0–8.0)

## 2010-11-28 LAB — URINE MICROSCOPIC-ADD ON

## 2010-11-28 LAB — URINE CULTURE: Colony Count: 45000

## 2011-01-09 ENCOUNTER — Ambulatory Visit: Payer: Medicaid Other | Admitting: Family Medicine

## 2011-02-24 ENCOUNTER — Encounter: Payer: Self-pay | Admitting: Family Medicine

## 2011-02-25 ENCOUNTER — Other Ambulatory Visit: Payer: Self-pay | Admitting: Family Medicine

## 2011-02-28 ENCOUNTER — Encounter: Payer: Self-pay | Admitting: Family Medicine

## 2011-03-03 ENCOUNTER — Ambulatory Visit (INDEPENDENT_AMBULATORY_CARE_PROVIDER_SITE_OTHER): Payer: Medicaid Other | Admitting: Family Medicine

## 2011-03-03 ENCOUNTER — Encounter: Payer: Self-pay | Admitting: Family Medicine

## 2011-03-03 VITALS — BP 126/78 | HR 92 | Resp 18 | Ht 65.5 in | Wt 230.1 lb

## 2011-03-03 DIAGNOSIS — I10 Essential (primary) hypertension: Secondary | ICD-10-CM

## 2011-03-03 DIAGNOSIS — E669 Obesity, unspecified: Secondary | ICD-10-CM

## 2011-03-03 DIAGNOSIS — E785 Hyperlipidemia, unspecified: Secondary | ICD-10-CM

## 2011-03-03 DIAGNOSIS — R569 Unspecified convulsions: Secondary | ICD-10-CM

## 2011-03-03 LAB — LIPID PANEL
Cholesterol: 189 mg/dL (ref 0–200)
HDL: 45 mg/dL (ref >39)
LDL Cholesterol: 130 mg/dL — ABNORMAL HIGH (ref 0–99)
Total CHOL/HDL Ratio: 4.2 Ratio
Triglycerides: 72 mg/dL (ref <150)
VLDL: 14 mg/dL (ref 0–40)

## 2011-03-03 LAB — CBC WITH DIFFERENTIAL/PLATELET
Basophils Absolute: 0 10*3/uL (ref 0.0–0.1)
Basophils Relative: 1 % (ref 0–1)
Eosinophils Absolute: 0.1 10*3/uL (ref 0.0–0.7)
Eosinophils Relative: 1 % (ref 0–5)
HCT: 37.2 % (ref 36.0–46.0)
Hemoglobin: 12.3 g/dL (ref 12.0–15.0)
Lymphocytes Relative: 38 % (ref 12–46)
Lymphs Abs: 1.3 10*3/uL (ref 0.7–4.0)
MCH: 28.1 pg (ref 26.0–34.0)
MCHC: 33.1 g/dL (ref 30.0–36.0)
MCV: 84.9 fL (ref 78.0–100.0)
Monocytes Absolute: 0.3 10*3/uL (ref 0.1–1.0)
Monocytes Relative: 8 % (ref 3–12)
Neutro Abs: 1.8 10*3/uL (ref 1.7–7.7)
Neutrophils Relative %: 52 % (ref 43–77)
Platelets: 202 10*3/uL (ref 150–400)
RBC: 4.38 MIL/uL (ref 3.87–5.11)
RDW: 13.8 % (ref 11.5–15.5)
WBC: 3.5 10*3/uL — ABNORMAL LOW (ref 4.0–10.5)

## 2011-03-03 LAB — COMPREHENSIVE METABOLIC PANEL
ALT: 8 U/L (ref 0–35)
AST: 19 U/L (ref 0–37)
Albumin: 4.5 g/dL (ref 3.5–5.2)
Alkaline Phosphatase: 109 U/L (ref 39–117)
BUN: 35 mg/dL — ABNORMAL HIGH (ref 6–23)
CO2: 26 mEq/L (ref 19–32)
Calcium: 10 mg/dL (ref 8.4–10.5)
Chloride: 102 mEq/L (ref 96–112)
Creat: 1.67 mg/dL — ABNORMAL HIGH (ref 0.50–1.10)
Glucose, Bld: 82 mg/dL (ref 70–99)
Potassium: 4.1 mEq/L (ref 3.5–5.3)
Sodium: 140 mEq/L (ref 135–145)
Total Bilirubin: 0.6 mg/dL (ref 0.3–1.2)
Total Protein: 8.5 g/dL — ABNORMAL HIGH (ref 6.0–8.3)

## 2011-03-03 LAB — TSH: TSH: 2.449 u[IU]/mL (ref 0.350–4.500)

## 2011-03-03 NOTE — Patient Instructions (Signed)
F/u  In 4 months, mid May  It is important that you exercise regularly at least 30 minutes 5 times a week. If you develop chest pain, have severe difficulty breathing, or feel very tired, stop exercising immediately and seek medical attention    Weight loss goal of 8 to 10 pounds.    No med changes .  You need the flu vaccine.  Fasting labs today.  All the best for 2013

## 2011-03-03 NOTE — Assessment & Plan Note (Signed)
Controlled and followed by neurology

## 2011-03-03 NOTE — Assessment & Plan Note (Signed)
Deteriorated. Patient re-educated about  the importance of commitment to a  minimum of 150 minutes of exercise per week. The importance of healthy food choices with portion control discussed. Encouraged to start a food diary, count calories and to consider  joining a support group. Sample diet sheets offered. Goals set by the patient for the next several months.    

## 2011-03-03 NOTE — Assessment & Plan Note (Signed)
Hyperlipidemia:Low fat diet discussed and encouraged.  Labs today

## 2011-03-03 NOTE — Assessment & Plan Note (Signed)
Controlled, no change in medication  

## 2011-03-03 NOTE — Progress Notes (Signed)
  Subjective:    Patient ID: [name], female    DOB: 1956-02-03, 56 y.o.   MRN: [id number]  HPI The PT is here for follow up and re-evaluation of chronic medical conditions, medication management and review of any available recent lab and radiology data.  Preventive health is updated, specifically  Cancer screening and Immunization.   Questions or concerns regarding consultations or procedures which the PT has had in the interim are  addressed. The PT denies any adverse reactions to current medications since the last visit.  There are no new concerns.  There are no specific complaints       Review of Systems See HPI Denies recent fever or chills. Denies sinus pressure, nasal congestion, ear pain or sore throat. Denies chest congestion, productive cough or wheezing. Denies chest pains, palpitations and leg swelling Denies abdominal pain, nausea, vomiting,diarrhea or constipation.   Denies dysuria, frequency, hesitancy or incontinence. Denies joint pain, swelling and limitation in mobility. Denies headaches, seizures, numbness, or tingling. Denies depression, anxiety or insomnia. Denies skin break down or rash.        Objective:   Physical Exam Patient alert and oriented and in no cardiopulmonary distress.  HEENT: No facial asymmetry, EOMI, no sinus tenderness,  oropharynx pink and moist.  Neck supple no adenopathy.  Chest: Clear to auscultation bilaterally.  CVS: S1, S2 no murmurs, no S3.  ABD: Soft non tender. Bowel sounds normal.  Ext: No edema  MS: Adequate ROM spine, shoulders, hips and knees.  Skin: Intact, no ulcerations or rash noted.  Psych: Good eye contact, normal affect. Memory intact not anxious or depressed appearing.  CNS: CN 2-12 intact, power, tone and sensation normal throughout.        Assessment & Plan:

## 2011-03-04 MED ORDER — SIMVASTATIN 40 MG PO TABS: ORAL_TABLET | ORAL | Status: DC

## 2011-03-04 MED ORDER — BENAZEPRIL-HYDROCHLOROTHIAZIDE 20-12.5 MG PO TABS: ORAL_TABLET | ORAL | Status: DC

## 2011-03-04 MED ORDER — OMEPRAZOLE 20 MG PO CPDR: DELAYED_RELEASE_CAPSULE | ORAL | Status: DC

## 2011-03-04 MED ORDER — FLUOXETINE HCL 10 MG PO CAPS: ORAL_CAPSULE | ORAL | Status: DC

## 2011-03-04 MED ORDER — LORATADINE 10 MG PO TABS: ORAL_TABLET | ORAL | Status: DC

## 2011-03-04 NOTE — Progress Notes (Signed)
Addended by: Denman George B on: 03/04/2011 08:32 AM   Modules accepted: Orders, Medications

## 2011-07-14 ENCOUNTER — Ambulatory Visit: Payer: Medicaid Other | Admitting: Family Medicine

## 2011-07-25 ENCOUNTER — Encounter: Payer: Self-pay | Admitting: Family Medicine

## 2011-07-25 ENCOUNTER — Other Ambulatory Visit: Payer: Self-pay | Admitting: Family Medicine

## 2011-07-25 ENCOUNTER — Ambulatory Visit (INDEPENDENT_AMBULATORY_CARE_PROVIDER_SITE_OTHER): Payer: Medicaid Other | Admitting: Family Medicine

## 2011-07-25 VITALS — BP 124/76 | HR 102 | Resp 18 | Ht 65.5 in | Wt 219.1 lb

## 2011-07-25 DIAGNOSIS — F3289 Other specified depressive episodes: Secondary | ICD-10-CM

## 2011-07-25 DIAGNOSIS — E785 Hyperlipidemia, unspecified: Secondary | ICD-10-CM

## 2011-07-25 DIAGNOSIS — R569 Unspecified convulsions: Secondary | ICD-10-CM

## 2011-07-25 DIAGNOSIS — Z139 Encounter for screening, unspecified: Secondary | ICD-10-CM

## 2011-07-25 DIAGNOSIS — E669 Obesity, unspecified: Secondary | ICD-10-CM

## 2011-07-25 DIAGNOSIS — I10 Essential (primary) hypertension: Secondary | ICD-10-CM

## 2011-07-25 DIAGNOSIS — F329 Major depressive disorder, single episode, unspecified: Secondary | ICD-10-CM

## 2011-07-25 NOTE — Assessment & Plan Note (Signed)
Improved. Pt applauded on succesful weight loss through lifestyle change, and encouraged to continue same. Weight loss goal set for the next several months.  

## 2011-07-25 NOTE — Assessment & Plan Note (Signed)
Uncontrolled, low fat diet discussed and encouraged, no med changes at this time

## 2011-07-25 NOTE — Patient Instructions (Signed)
F/u in 4 month  Fasting lipid and cmp July 7 or after.  It is important that you exercise regularly at least 30 minutes 5 times a week. If you develop chest pain, have severe difficulty breathing, or feel very tired, stop exercising immediately and seek medical attention   A healthy diet is rich in fruit, vegetables and whole grains. Poultry fish, nuts and beans are a healthy choice for protein rather then red meat. A low sodium diet and drinking 64 ounces of water daily is generally recommended. Oils and sweet should be limited. Carbohydrates especially for those who are diabetic or overweight, should be limited to 30-45 gram per meal. It is important to eat on a regular schedule, at least 3 times daily. Snacks should be primarily fruits, vegetables or nuts.Congrats on weight loss, keep it up!   Mammogram will be scheduled for mid August when due we will let you know  No med changes

## 2011-07-25 NOTE — Assessment & Plan Note (Signed)
Controlled, no change in medication  

## 2011-07-25 NOTE — Assessment & Plan Note (Signed)
Controlled and followed by neurology

## 2011-07-25 NOTE — Progress Notes (Signed)
  Subjective:    Patient ID: [name], female    DOB: 07-14-1955, 56 y.o.   MRN: [id number]  HPI The PT is here for follow up and re-evaluation of chronic medical conditions, medication management and review of any available recent lab and radiology data.  Preventive health is updated, specifically  Cancer screening and Immunization.   Questions or concerns regarding consultations or procedures which the PT has had in the interim are  addressed. The PT denies any adverse reactions to current medications since the last visit.  There are no new concerns.  There are no specific complaints       Review of Systems See HPI Denies recent fever or chills. Denies sinus pressure, nasal congestion, ear pain or sore throat. Denies chest congestion, productive cough or wheezing. Denies chest pains, palpitations and leg swelling Denies abdominal pain, nausea, vomiting,diarrhea or constipation.   Denies dysuria, frequency, hesitancy or incontinence. Denies joint pain, swelling and limitation in mobility. Denies headaches, seizures, numbness, or tingling. Denies depression, anxiety or insomnia. Denies skin break down or rash.        Objective:   Physical Exam   Patient alert and oriented and in no cardiopulmonary distress.  HEENT: No facial asymmetry, EOMI, no sinus tenderness,  oropharynx pink and moist.  Neck supple no adenopathy.  Chest: Clear to auscultation bilaterally.  CVS: S1, S2 no murmurs, no S3.  ABD: Soft non tender. Bowel sounds normal.  Ext: No edema  MS: Adequate ROM spine, shoulders, hips and knees.  Skin: Intact, no ulcerations or rash noted.  Psych: Good eye contact, mild mental retardation, not anxious or depressed appearing.  CNS: CN 2-12 intact, power, tone and sensation normal throughout.      Assessment & Plan:

## 2011-07-26 ENCOUNTER — Other Ambulatory Visit: Payer: Self-pay | Admitting: Family Medicine

## 2011-09-02 LAB — COMPREHENSIVE METABOLIC PANEL
ALT: 8 U/L (ref 0–35)
AST: 19 U/L (ref 0–37)
Albumin: 4.1 g/dL (ref 3.5–5.2)
Alkaline Phosphatase: 101 U/L (ref 39–117)
BUN: 29 mg/dL — ABNORMAL HIGH (ref 6–23)
CO2: 18 mEq/L — ABNORMAL LOW (ref 19–32)
Calcium: 9.4 mg/dL (ref 8.4–10.5)
Chloride: 107 mEq/L (ref 96–112)
Creat: 1.55 mg/dL — ABNORMAL HIGH (ref 0.50–1.10)
Glucose, Bld: 90 mg/dL (ref 70–99)
Potassium: 4.8 mEq/L (ref 3.5–5.3)
Sodium: 138 mEq/L (ref 135–145)
Total Bilirubin: 0.5 mg/dL (ref 0.3–1.2)
Total Protein: 7.9 g/dL (ref 6.0–8.3)

## 2011-09-02 LAB — LIPID PANEL
Cholesterol: 176 mg/dL (ref 0–200)
HDL: 42 mg/dL (ref >39)
LDL Cholesterol: 116 mg/dL — ABNORMAL HIGH (ref 0–99)
Total CHOL/HDL Ratio: 4.2 Ratio
Triglycerides: 91 mg/dL (ref <150)
VLDL: 18 mg/dL (ref 0–40)

## 2011-10-06 ENCOUNTER — Ambulatory Visit (HOSPITAL_COMMUNITY)
Admission: RE | Admit: 2011-10-06 | Discharge: 2011-10-06 | Disposition: A | Discharge status: Home / Self Care | Payer: Medicaid Other | Source: Ambulatory Visit | Attending: Family Medicine | Admitting: Family Medicine

## 2011-10-06 DIAGNOSIS — Z1231 Encounter for screening mammogram for malignant neoplasm of breast: Secondary | ICD-10-CM | POA: Insufficient documentation

## 2011-10-06 DIAGNOSIS — Z139 Encounter for screening, unspecified: Secondary | ICD-10-CM

## 2011-10-13 ENCOUNTER — Other Ambulatory Visit: Payer: Self-pay | Admitting: Family Medicine

## 2011-10-13 DIAGNOSIS — R928 Other abnormal and inconclusive findings on diagnostic imaging of breast: Secondary | ICD-10-CM

## 2011-10-22 ENCOUNTER — Other Ambulatory Visit: Payer: Self-pay | Admitting: Family Medicine

## 2011-10-22 ENCOUNTER — Ambulatory Visit (HOSPITAL_COMMUNITY)
Admission: RE | Admit: 2011-10-22 | Discharge: 2011-10-22 | Disposition: A | Discharge status: Home / Self Care | Payer: Medicaid Other | Source: Ambulatory Visit | Attending: Family Medicine | Admitting: Family Medicine

## 2011-10-22 DIAGNOSIS — R928 Other abnormal and inconclusive findings on diagnostic imaging of breast: Secondary | ICD-10-CM

## 2011-10-22 DIAGNOSIS — N6009 Solitary cyst of unspecified breast: Secondary | ICD-10-CM | POA: Insufficient documentation

## 2011-11-24 ENCOUNTER — Encounter: Payer: Self-pay | Admitting: Family Medicine

## 2011-11-24 ENCOUNTER — Ambulatory Visit (INDEPENDENT_AMBULATORY_CARE_PROVIDER_SITE_OTHER): Payer: Medicaid Other | Admitting: Family Medicine

## 2011-11-24 VITALS — BP 116/70 | HR 75 | Resp 18 | Ht 65.5 in | Wt 233.0 lb

## 2011-11-24 DIAGNOSIS — F329 Major depressive disorder, single episode, unspecified: Secondary | ICD-10-CM

## 2011-11-24 DIAGNOSIS — Z23 Encounter for immunization: Secondary | ICD-10-CM

## 2011-11-24 DIAGNOSIS — R5381 Other malaise: Secondary | ICD-10-CM

## 2011-11-24 DIAGNOSIS — R569 Unspecified convulsions: Secondary | ICD-10-CM

## 2011-11-24 DIAGNOSIS — F3289 Other specified depressive episodes: Secondary | ICD-10-CM

## 2011-11-24 DIAGNOSIS — E785 Hyperlipidemia, unspecified: Secondary | ICD-10-CM

## 2011-11-24 DIAGNOSIS — E669 Obesity, unspecified: Secondary | ICD-10-CM

## 2011-11-24 DIAGNOSIS — I10 Essential (primary) hypertension: Secondary | ICD-10-CM

## 2011-11-24 NOTE — Assessment & Plan Note (Signed)
Controlled, no change in medication  

## 2011-11-24 NOTE — Assessment & Plan Note (Signed)
Stable followed by neurology 

## 2011-11-24 NOTE — Assessment & Plan Note (Signed)
Deteriorated. Patient re-educated about  the importance of commitment to a  minimum of 150 minutes of exercise per week. The importance of healthy food choices with portion control discussed. Encouraged to start a food diary, count calories and to consider  joining a support group. Sample diet sheets offered. Goals set by the patient for the next several months.    

## 2011-11-24 NOTE — Progress Notes (Signed)
  Subjective:    Patient ID: [name], female    DOB: 10-27-1955, 56 y.o.   MRN: [id number]  HPI The PT is here for follow up and re-evaluation of chronic medical conditions, medication management and review of any available recent lab and radiology data.  Preventive health is updated, specifically  Cancer screening and Immunization.   Questions or concerns regarding consultations or procedures which the PT has had in the interim are  addressed. The PT denies any adverse reactions to current medications since the last visit.  There are no new concerns. States she has "near seizures" but no actual seizure activity yo her knowledge. Not motivated to lose weight, still eating high carb diet, with a lot of sugar and no commitment to regular physical activity     Review of Systems See HPI Denies recent fever or chills. Denies sinus pressure, nasal congestion, ear pain or sore throat. Denies chest congestion, productive cough or wheezing. Denies chest pains, palpitations and leg swelling Denies abdominal pain, nausea, vomiting,diarrhea or constipation.   Denies dysuria, frequency, hesitancy or incontinence. Denies joint pain, swelling and limitation in mobility. Denies headaches, seizures, numbness, or tingling. Denies depression, anxiety or insomnia. Denies skin break down or rash.        Objective:   Physical Exam  Patient alert and oriented and in no cardiopulmonary distress.  HEENT: No facial asymmetry, EOMI, no sinus tenderness,  oropharynx pink and moist.  Neck supple no adenopathy.  Chest: Clear to auscultation bilaterally.  CVS: S1, S2 no murmurs, no S3.  ABD: Soft non tender. Bowel sounds normal.  Ext: No edema  MS: Adequate ROM spine, shoulders, hips and knees.  Skin: Intact, no ulcerations or rash noted.  Psych: Good eye contact, normal affect. Memory intact not anxious or depressed appearing.  CNS: CN 2-12 intact, power, tone and sensation normal  throughout.       Assessment & Plan:

## 2011-11-24 NOTE — Patient Instructions (Addendum)
CPE in 4.5 month  Flu vaccine today.  Please cut back on sugar and carbs , commit to daily exercise, so that you lose weight.  Non changes in medication    CBC, fasting lipid, cmp, tSH in 4.5 month  Weight loss goal of 6 pounds

## 2011-11-24 NOTE — Assessment & Plan Note (Signed)
Controlled, no change in medication DASH diet and commitment to daily physical activity for a minimum of 30 minutes discussed and encouraged, as a part of hypertension management. The importance of attaining a healthy weight is also discussed.  

## 2011-11-24 NOTE — Assessment & Plan Note (Signed)
Uncontrolled  Dietary modification to reduced fat diet discussed and encouraged. No med change

## 2011-11-25 ENCOUNTER — Other Ambulatory Visit: Payer: Self-pay | Admitting: Family Medicine

## 2011-11-28 ENCOUNTER — Other Ambulatory Visit: Payer: Self-pay

## 2011-11-28 MED ORDER — OMEPRAZOLE 20 MG PO CPDR: 20.0000 mg | DELAYED_RELEASE_CAPSULE | Freq: Every day | ORAL | Status: DC

## 2012-01-26 ENCOUNTER — Other Ambulatory Visit: Payer: Self-pay

## 2012-01-26 MED ORDER — OMEPRAZOLE 20 MG PO CPDR: 40.0000 mg | DELAYED_RELEASE_CAPSULE | Freq: Every day | ORAL | Status: DC

## 2012-01-27 DIAGNOSIS — F79 Unspecified intellectual disabilities: Secondary | ICD-10-CM

## 2012-01-27 DIAGNOSIS — R569 Unspecified convulsions: Secondary | ICD-10-CM

## 2012-01-27 DIAGNOSIS — I10 Essential (primary) hypertension: Secondary | ICD-10-CM

## 2012-01-28 ENCOUNTER — Telehealth: Payer: Self-pay | Admitting: Family Medicine

## 2012-02-02 NOTE — Telephone Encounter (Signed)
Spoke with daughter, she will bring another form tomorrow

## 2012-02-02 NOTE — Telephone Encounter (Signed)
Per the front staff the [employer] paper was never placed up front to be collected. The family is now calling about the paper and state they dropped it off on Nov 12 and I seen where the form was signed in on their "form log" when she dropped it off but it is not up front.

## 2012-03-23 ENCOUNTER — Other Ambulatory Visit: Payer: Self-pay | Admitting: Family Medicine

## 2012-03-23 LAB — CBC
HCT: 37.6 % (ref 36.0–46.0)
Hemoglobin: 12.1 g/dL (ref 12.0–15.0)
MCH: 27.3 pg (ref 26.0–34.0)
MCHC: 32.2 g/dL (ref 30.0–36.0)
MCV: 84.9 fL (ref 78.0–100.0)
Platelets: 244 10*3/uL (ref 150–400)
RBC: 4.43 MIL/uL (ref 3.87–5.11)
RDW: 14.8 % (ref 11.5–15.5)
WBC: 3.7 10*3/uL — ABNORMAL LOW (ref 4.0–10.5)

## 2012-03-24 LAB — LIPID PANEL
Cholesterol: 185 mg/dL (ref 0–200)
HDL: 43 mg/dL (ref >39)
LDL Cholesterol: 123 mg/dL — ABNORMAL HIGH (ref 0–99)
Total CHOL/HDL Ratio: 4.3 Ratio
Triglycerides: 95 mg/dL (ref <150)
VLDL: 19 mg/dL (ref 0–40)

## 2012-03-24 LAB — COMPREHENSIVE METABOLIC PANEL
ALT: 8 U/L (ref 0–35)
AST: 18 U/L (ref 0–37)
Albumin: 4.2 g/dL (ref 3.5–5.2)
Alkaline Phosphatase: 103 U/L (ref 39–117)
BUN: 19 mg/dL (ref 6–23)
CO2: 33 mEq/L — ABNORMAL HIGH (ref 19–32)
Calcium: 9.7 mg/dL (ref 8.4–10.5)
Chloride: 105 mEq/L (ref 96–112)
Creat: 1.55 mg/dL — ABNORMAL HIGH (ref 0.50–1.10)
Glucose, Bld: 93 mg/dL (ref 70–99)
Potassium: 4.4 mEq/L (ref 3.5–5.3)
Sodium: 142 mEq/L (ref 135–145)
Total Bilirubin: 0.5 mg/dL (ref 0.3–1.2)
Total Protein: 7.6 g/dL (ref 6.0–8.3)

## 2012-03-24 LAB — TSH: TSH: 3.652 u[IU]/mL (ref 0.350–4.500)

## 2012-03-30 ENCOUNTER — Ambulatory Visit (INDEPENDENT_AMBULATORY_CARE_PROVIDER_SITE_OTHER): Payer: Medicaid Other | Admitting: Family Medicine

## 2012-03-30 ENCOUNTER — Encounter: Payer: Self-pay | Admitting: Family Medicine

## 2012-03-30 VITALS — BP 138/82 | HR 95 | Resp 18 | Ht 65.5 in | Wt 231.0 lb

## 2012-03-30 DIAGNOSIS — R7309 Other abnormal glucose: Secondary | ICD-10-CM

## 2012-03-30 DIAGNOSIS — Z0000 Encounter for general adult medical examination without abnormal findings: Secondary | ICD-10-CM

## 2012-03-30 DIAGNOSIS — R7302 Impaired glucose tolerance (oral): Secondary | ICD-10-CM

## 2012-03-30 DIAGNOSIS — E785 Hyperlipidemia, unspecified: Secondary | ICD-10-CM

## 2012-03-30 DIAGNOSIS — Z1211 Encounter for screening for malignant neoplasm of colon: Secondary | ICD-10-CM

## 2012-03-30 LAB — POC HEMOCCULT BLD/STL (OFFICE/1-CARD/DIAGNOSTIC): Fecal Occult Blood, POC: NEGATIVE

## 2012-03-30 NOTE — Patient Instructions (Addendum)
Df/u in 5.5 month, please call if you need me before.  It is important that you exercise regularly at least 30 minutes 5 times a week. If you develop chest pain, have severe difficulty breathing, or feel very tired, stop exercising immediately and seek medical attention   A healthy diet is rich in fruit, vegetables and whole grains. Poultry fish, nuts and beans are a healthy choice for protein rather then red meat. A low sodium diet and drinking 64 ounces of water daily is generally recommended. Oils and sweet should be limited.   Weight loss goal of 10 pounds.  Cholesterol is a little higher than it should be  Fasting lipid, cmp and hBa1C in 5.5 month

## 2012-04-10 NOTE — Assessment & Plan Note (Signed)
Pelvic, rectal and genital exam completed as documented Pt encouraged to commit to regular exercise and change in diet to lose weight

## 2012-04-10 NOTE — Progress Notes (Signed)
  Subjective:    Patient ID: [name], female    DOB: 1955/10/21, 57 y.o.   MRN: [id number]  HPI  The PT is here for annual exam and re-evaluation of chronic medical conditions, medication management and review of any available recent lab and radiology data.  Preventive health is updated, specifically  Cancer screening and Immunization.   Questions or concerns regarding consultations or procedures which the PT has had in the interim are  addressed. The PT denies any adverse reactions to current medications since the last visit.  There are no new concerns.  There are no specific complaints      Review of Systems See HPI Denies recent fever or chills. Denies sinus pressure, nasal congestion, ear pain or sore throat. Denies chest congestion, productive cough or wheezing. Denies chest pains, palpitations and leg swelling Denies abdominal pain, nausea, vomiting,diarrhea or constipation.   Denies dysuria, frequency, hesitancy or incontinence. Denies joint pain, swelling and limitation in mobility. Denies headaches, seizures, numbness, or tingling. Denies depression, anxiety or insomnia. Denies skin break down or rash.        Objective:   Physical Exam Pleasant obese female, alert and oriented x 3, in no cardio-pulmonary distress. Afebrile. HEENT No facial trauma or asymetry. Sinuses non tender.  EOMI, PERTL, fundoscopic exam  no hemorhage or exudate.  External ears normal, tympanic membranes clear. Oropharynx moist, no exudate, poor dentition. Neck: supple, no adenopathy,JVD or thyromegaly.No bruits.  Chest: Clear to ascultation bilaterally.No crackles or wheezes. Non tender to palpation  Breast: No asymetry,no masses. No nipple discharge or inversion. No axillary or supraclavicular adenopathy  Cardiovascular system; Heart sounds normal,  S1 and  S2 ,no S3.  No murmur, or thrill. Apical beat not displaced Peripheral pulses normal.  Abdomen: Soft, non tender, no  organomegaly or masses. No bruits. Bowel sounds normal. No guarding, tenderness or rebound.  Rectal:  No mass. Guaiac negative stool.  GU: External genitalia normal. No lesions. Vaginal canal normal.Physiologic discharge. Uterus absent, no adnexal masses, no  adnexal tenderness.  Musculoskeletal exam: Full ROM of spine, hips , shoulders and knees. No deformity ,swelling or crepitus noted. No muscle wasting or atrophy.   Neurologic: Cranial nerves 2 to 12 intact. Power, tone ,sensation and reflexes normal throughout. No disturbance in gait. No tremor.  Skin: Intact, no ulceration, erythema , scaling or rash noted. Pigmentation normal throughout  Psych; Normal mood and affect. Judgement and concentration normal  ild mental retardation       Assessment & Plan:

## 2012-04-24 ENCOUNTER — Other Ambulatory Visit: Payer: Self-pay | Admitting: Family Medicine

## 2012-06-24 ENCOUNTER — Other Ambulatory Visit: Payer: Self-pay | Admitting: Family Medicine

## 2012-06-25 ENCOUNTER — Other Ambulatory Visit: Payer: Self-pay | Admitting: Family Medicine

## 2012-08-10 LAB — COMPREHENSIVE METABOLIC PANEL
ALT: 10 U/L (ref 0–35)
AST: 23 U/L (ref 0–37)
Albumin: 3.9 g/dL (ref 3.5–5.2)
Alkaline Phosphatase: 100 U/L (ref 39–117)
BUN: 27 mg/dL — ABNORMAL HIGH (ref 6–23)
CO2: 24 mEq/L (ref 19–32)
Calcium: 9.3 mg/dL (ref 8.4–10.5)
Chloride: 103 mEq/L (ref 96–112)
Creat: 1.64 mg/dL — ABNORMAL HIGH (ref 0.50–1.10)
Glucose, Bld: 86 mg/dL (ref 70–99)
Potassium: 3.8 mEq/L (ref 3.5–5.3)
Sodium: 138 mEq/L (ref 135–145)
Total Bilirubin: 0.5 mg/dL (ref 0.3–1.2)
Total Protein: 7.9 g/dL (ref 6.0–8.3)

## 2012-08-10 LAB — LIPID PANEL
Cholesterol: 190 mg/dL (ref 0–200)
HDL: 51 mg/dL (ref >39)
LDL Cholesterol: 126 mg/dL — ABNORMAL HIGH (ref 0–99)
Total CHOL/HDL Ratio: 3.7 Ratio
Triglycerides: 67 mg/dL (ref <150)
VLDL: 13 mg/dL (ref 0–40)

## 2012-08-10 LAB — HEMOGLOBIN A1C
Hgb A1c MFr Bld: 5.3 % (ref <5.7)
Mean Plasma Glucose: 105 mg/dL (ref <117)

## 2012-08-24 ENCOUNTER — Other Ambulatory Visit: Payer: Self-pay | Admitting: Family Medicine

## 2012-09-14 ENCOUNTER — Encounter: Payer: Self-pay | Admitting: Family Medicine

## 2012-09-14 ENCOUNTER — Ambulatory Visit (INDEPENDENT_AMBULATORY_CARE_PROVIDER_SITE_OTHER): Payer: Medicaid Other | Admitting: Family Medicine

## 2012-09-14 ENCOUNTER — Other Ambulatory Visit: Payer: Self-pay | Admitting: Family Medicine

## 2012-09-14 VITALS — BP 130/90 | HR 98 | Resp 16 | Ht 65.5 in | Wt 233.8 lb

## 2012-09-14 DIAGNOSIS — R569 Unspecified convulsions: Secondary | ICD-10-CM

## 2012-09-14 DIAGNOSIS — E049 Nontoxic goiter, unspecified: Secondary | ICD-10-CM

## 2012-09-14 DIAGNOSIS — E042 Nontoxic multinodular goiter: Secondary | ICD-10-CM

## 2012-09-14 DIAGNOSIS — M25519 Pain in unspecified shoulder: Secondary | ICD-10-CM

## 2012-09-14 DIAGNOSIS — E669 Obesity, unspecified: Secondary | ICD-10-CM

## 2012-09-14 DIAGNOSIS — E785 Hyperlipidemia, unspecified: Secondary | ICD-10-CM

## 2012-09-14 DIAGNOSIS — M25512 Pain in left shoulder: Secondary | ICD-10-CM | POA: Insufficient documentation

## 2012-09-14 DIAGNOSIS — I10 Essential (primary) hypertension: Secondary | ICD-10-CM

## 2012-09-14 DIAGNOSIS — Z139 Encounter for screening, unspecified: Secondary | ICD-10-CM

## 2012-09-14 MED ORDER — METHYLPREDNISOLONE ACETATE 80 MG/ML IJ SUSP
80.0000 mg | Freq: Once | INTRAMUSCULAR | Status: AC
  Administered 2012-09-14: 80 mg via INTRAMUSCULAR

## 2012-09-14 MED ORDER — ASPIRIN EC 81 MG PO TBEC: 81.0000 mg | DELAYED_RELEASE_TABLET | Freq: Every day | ORAL | Status: DC

## 2012-09-14 MED ORDER — KETOROLAC TROMETHAMINE 60 MG/2ML IJ SOLN
60.0000 mg | Freq: Once | INTRAMUSCULAR | Status: AC
  Administered 2012-09-14: 60 mg via INTRAMUSCULAR

## 2012-09-14 MED ORDER — PREDNISONE 5 MG PO TABS: 5.0000 mg | ORAL_TABLET | Freq: Two times a day (BID) | ORAL | Status: AC

## 2012-09-14 NOTE — Patient Instructions (Addendum)
F/u in early December, call if you need me before  You are referred for an [place] of your thyroid gland, we will try to get this the same day as your mammogram, around the same time, wait for appts before you leave if possible please  Please eat less and walk every day, you need to lose weight  New is aspirin 81 mg once daily for reduction of stroke risk  New is prednisone twice daily for left shoulder pain for 5 days, also toradol 60mg  IM and depo medrol 80 mg IM in the office today for this

## 2012-09-15 ENCOUNTER — Telehealth: Payer: Self-pay | Admitting: Family Medicine

## 2012-09-15 NOTE — Assessment & Plan Note (Signed)
Acute onset, no trauma, short , sharp course of anti inflammatory

## 2012-09-15 NOTE — Assessment & Plan Note (Signed)
Ultrasound to re evaluate and f/u nodules

## 2012-09-15 NOTE — Progress Notes (Signed)
  Subjective:    Patient ID: [name], female    DOB: 16-Jan-1956, 57 y.o.   MRN: [id number]  HPI The PT is here for follow up and re-evaluation of chronic medical conditions, medication management and review of any available recent lab and radiology data.  Preventive health is updated, specifically  Cancer screening and Immunization.   Follows with neurology re her seizure disorder, and this is controlled on her current medication. The PT denies any adverse reactions to current medications since the last visit.  Still no weight loss, eats "too much" and not exercising regularly, understands the need to change 1 week h/o left shoulder pain, no inciting trauma       Review of Systems See HPI Denies recent fever or chills. Denies sinus pressure, nasal congestion, ear pain or sore throat. Denies chest congestion, productive cough or wheezing. Denies chest pains, palpitations and leg swelling Denies abdominal pain, nausea, vomiting,diarrhea or constipation.   Denies dysuria, frequency, hesitancy or incontinence.  Denies headaches, seizures, numbness, or tingling. Denies depression, anxiety or insomnia. Denies skin break down or rash.        Objective:   Physical Exam  Patient alert and oriented and in no cardiopulmonary distress.  HEENT: No facial asymmetry, EOMI, no sinus tenderness,  oropharynx pink and moist.  Neck supple no adenopathy.  Chest: Clear to auscultation bilaterally.  CVS: S1, S2 no murmurs, no S3.  ABD: Soft non tender. Bowel sounds normal.  Ext: No edema  MS: Adequate ROM spine,  hips and knees.Decreased ROM left shoulder  Skin: Intact, no ulcerations or rash noted.  Psych: Good eye contact, normal affect. Memory intact not anxious or depressed appearing.  CNS: CN 2-12 intact, power, tone and sensation normal throughout.       Assessment & Plan:

## 2012-09-15 NOTE — Assessment & Plan Note (Signed)
Controlled and managed by neurology 

## 2012-09-15 NOTE — Assessment & Plan Note (Signed)
Not at goal, weight loss, lower sodium intake and regular exercise, no med changes

## 2012-09-15 NOTE — Assessment & Plan Note (Signed)
Bad cholesterol has increased, needs to reduce fried and fatty foods, she is aware

## 2012-09-15 NOTE — Assessment & Plan Note (Signed)
Deteriorated. Patient re-educated about  the importance of commitment to a  minimum of 150 minutes of exercise per week. The importance of healthy food choices with portion control discussed. Encouraged to start a food diary, count calories and to consider  joining a support group. Sample diet sheets offered. Goals set by the patient for the next several months.    

## 2012-09-16 ENCOUNTER — Other Ambulatory Visit (HOSPITAL_COMMUNITY): Payer: Medicaid Other

## 2012-09-16 ENCOUNTER — Other Ambulatory Visit: Payer: Self-pay

## 2012-09-16 MED ORDER — CALCITRIOL 0.25 MCG PO CAPS: ORAL_CAPSULE | ORAL | Status: DC

## 2012-09-16 MED ORDER — BENAZEPRIL HCL 20 MG PO TABS: ORAL_TABLET | ORAL | Status: DC

## 2012-09-16 MED ORDER — HYDROCHLOROTHIAZIDE 12.5 MG PO CAPS: ORAL_CAPSULE | ORAL | Status: DC

## 2012-09-16 NOTE — Telephone Encounter (Signed)
Pre cert #

## 2012-09-17 ENCOUNTER — Ambulatory Visit (HOSPITAL_COMMUNITY)
Admission: RE | Admit: 2012-09-17 | Discharge: 2012-09-17 | Disposition: A | Discharge status: Home / Self Care | Payer: Medicaid Other | Source: Ambulatory Visit | Attending: Family Medicine | Admitting: Family Medicine

## 2012-09-17 ENCOUNTER — Other Ambulatory Visit: Payer: Self-pay | Admitting: Family Medicine

## 2012-09-17 DIAGNOSIS — E041 Nontoxic single thyroid nodule: Secondary | ICD-10-CM

## 2012-09-17 DIAGNOSIS — E049 Nontoxic goiter, unspecified: Secondary | ICD-10-CM

## 2012-09-17 DIAGNOSIS — E042 Nontoxic multinodular goiter: Secondary | ICD-10-CM

## 2012-09-20 ENCOUNTER — Encounter: Payer: Self-pay | Admitting: Family Medicine

## 2012-09-20 NOTE — Progress Notes (Signed)
Can not reach patient by phone so I mailed her a letter stating when her appointment is with Dr. Benjamine Mola and also that we need a new phone number to get in touch with her at

## 2012-10-07 ENCOUNTER — Ambulatory Visit (INDEPENDENT_AMBULATORY_CARE_PROVIDER_SITE_OTHER): Payer: Medicaid Other | Admitting: Otolaryngology

## 2012-10-07 ENCOUNTER — Ambulatory Visit (HOSPITAL_COMMUNITY)
Admission: RE | Admit: 2012-10-07 | Discharge: 2012-10-07 | Disposition: A | Discharge status: Home / Self Care | Payer: Medicaid Other | Source: Ambulatory Visit | Attending: Family Medicine | Admitting: Family Medicine

## 2012-10-07 DIAGNOSIS — D449 Neoplasm of uncertain behavior of unspecified endocrine gland: Secondary | ICD-10-CM

## 2012-10-07 DIAGNOSIS — Z139 Encounter for screening, unspecified: Secondary | ICD-10-CM

## 2012-10-07 DIAGNOSIS — Z1231 Encounter for screening mammogram for malignant neoplasm of breast: Secondary | ICD-10-CM | POA: Insufficient documentation

## 2012-10-08 ENCOUNTER — Other Ambulatory Visit (INDEPENDENT_AMBULATORY_CARE_PROVIDER_SITE_OTHER): Payer: Self-pay | Admitting: Otolaryngology

## 2012-10-08 DIAGNOSIS — E079 Disorder of thyroid, unspecified: Secondary | ICD-10-CM

## 2012-10-14 ENCOUNTER — Encounter (HOSPITAL_COMMUNITY): Payer: Self-pay

## 2012-10-14 ENCOUNTER — Other Ambulatory Visit (INDEPENDENT_AMBULATORY_CARE_PROVIDER_SITE_OTHER): Payer: Self-pay | Admitting: Otolaryngology

## 2012-10-14 ENCOUNTER — Ambulatory Visit (HOSPITAL_COMMUNITY)
Admission: RE | Admit: 2012-10-14 | Discharge: 2012-10-14 | Disposition: A | Discharge status: Home / Self Care | Payer: Medicaid Other | Source: Ambulatory Visit | Attending: Otolaryngology | Admitting: Otolaryngology

## 2012-10-14 DIAGNOSIS — E041 Nontoxic single thyroid nodule: Secondary | ICD-10-CM | POA: Insufficient documentation

## 2012-10-14 DIAGNOSIS — E079 Disorder of thyroid, unspecified: Secondary | ICD-10-CM

## 2012-10-14 MED ORDER — LIDOCAINE HCL (PF) 2 % IJ SOLN
INTRAMUSCULAR | Status: AC
  Filled 2012-10-14: qty 10

## 2012-10-14 MED ORDER — LIDOCAINE HCL (PF) 2 % IJ SOLN
10.0000 mL | Freq: Once | INTRAMUSCULAR | Status: AC
  Filled 2012-10-14: qty 10

## 2012-10-14 MED ORDER — LIDOCAINE HCL (PF) 2 % IJ SOLN
INTRAMUSCULAR | Status: AC
  Administered 2012-10-14: 10 mL
  Filled 2012-10-14: qty 20

## 2012-10-14 NOTE — Progress Notes (Signed)
Pt for thyroid biopsy with Dr. Thornton Papas. Right, left, and isthmus to be biopsied. Pt given 33ml lidocaine 2% by MD. Pt tolerated procedure well no signs of distress.

## 2012-10-14 NOTE — Procedures (Signed)
PreOperative Dx: Multiple thyroid nodules Postoperative Dx: Multiple thyroid nodules Procedure:   US guided FNA of (THREE) bilateral thyroid nodule Radiologist:  Thornton Papas Anesthesia:  2 ml of 2 lidocaine Specimen:  FNA x 3 of each of 3 thyroid nodules: ISTHMIC, RIGHT lobe, and LEFT lobe  EBL:   < 1 ml Complications: Small hematomas anterior to both thyroid lobes, suspect related to chronic low-dose aspirin therapy

## 2012-10-25 ENCOUNTER — Other Ambulatory Visit: Payer: Self-pay | Admitting: Family Medicine

## 2012-10-28 ENCOUNTER — Ambulatory Visit (INDEPENDENT_AMBULATORY_CARE_PROVIDER_SITE_OTHER): Payer: Medicaid Other | Admitting: Otolaryngology

## 2012-10-28 DIAGNOSIS — C73 Malignant neoplasm of thyroid gland: Secondary | ICD-10-CM

## 2012-10-29 ENCOUNTER — Other Ambulatory Visit: Payer: Self-pay | Admitting: Otolaryngology

## 2012-11-02 ENCOUNTER — Encounter (HOSPITAL_COMMUNITY): Payer: Self-pay | Admitting: Pharmacy Technician

## 2012-11-10 NOTE — Pre-Procedure Instructions (Signed)
GWENIVERE BOAK  11/10/2012   Your procedure is scheduled on:  September 24th.  Report to [facility], Main Entrance / Entrance "A" at 6:30 AM.  Call this number if you have problems the morning of surgery: [phone]   Remember:   Do not eat food or drink liquids after midnight.   Take these medicines the morning of surgery with A SIP OF WATER: FLUoxetine (PROZAC), lamoTRIgine (LAMICTAL),levETIRAcetam (KEPPRA),  loratadine (CLARITIN), omeprazole (PRILOSEC).    Do not wear jewelry, make-up or nail polish.  Do not wear lotions, powders, or perfumes. You may wear deodorant.             Do not shave 48 hours before surgery.  Do not bring valuables to the hospital.  [facility] is not responsible for any belongings or valuables.  Contacts, dentures or bridgework may not be worn into surgery.  Leave suitcase in the car. After surgery it may be brought to your room.  For patients admitted to the hospital, checkout time is 11:00 AM the day of discharge.   Patients discharged the day of surgery will not be allowed to drive home.     Special Instructions: Shower using CHG 2 nights before surgery and the night before surgery.  If you shower the day of surgery use CHG.  Use special wash - you have one bottle of CHG for all showers.  You should use approximately 1/3 of the bottle for each shower.   Please read over the following fact sheets that you were given: Pain Booklet, Coughing and Deep Breathing and Surgical Site Infection Prevention

## 2012-11-11 ENCOUNTER — Encounter (HOSPITAL_COMMUNITY): Payer: Self-pay

## 2012-11-11 ENCOUNTER — Inpatient Hospital Stay (HOSPITAL_COMMUNITY)
Admission: RE | Admit: 2012-11-11 | Discharge: 2012-11-11 | Disposition: A | Discharge status: Home / Self Care | Payer: Medicaid Other | Source: Ambulatory Visit

## 2012-11-17 ENCOUNTER — Ambulatory Visit (HOSPITAL_COMMUNITY)
Admission: RE | Admit: 2012-11-17 | Discharge: 2012-11-18 | Disposition: A | Discharge status: Home / Self Care | Payer: Medicaid Other | Source: Ambulatory Visit | Attending: Otolaryngology | Admitting: Otolaryngology

## 2012-11-17 ENCOUNTER — Encounter (HOSPITAL_COMMUNITY): Payer: Self-pay | Admitting: *Deleted

## 2012-11-17 ENCOUNTER — Encounter (HOSPITAL_COMMUNITY)
Admission: RE | Disposition: A | Discharge status: Home / Self Care | Payer: Self-pay | Source: Ambulatory Visit | Attending: Otolaryngology

## 2012-11-17 ENCOUNTER — Ambulatory Visit (HOSPITAL_COMMUNITY): Payer: Medicaid Other

## 2012-11-17 ENCOUNTER — Ambulatory Visit (HOSPITAL_COMMUNITY): Payer: Medicaid Other | Admitting: *Deleted

## 2012-11-17 DIAGNOSIS — I10 Essential (primary) hypertension: Secondary | ICD-10-CM | POA: Insufficient documentation

## 2012-11-17 DIAGNOSIS — R569 Unspecified convulsions: Secondary | ICD-10-CM | POA: Insufficient documentation

## 2012-11-17 DIAGNOSIS — E890 Postprocedural hypothyroidism: Secondary | ICD-10-CM

## 2012-11-17 DIAGNOSIS — F329 Major depressive disorder, single episode, unspecified: Secondary | ICD-10-CM | POA: Insufficient documentation

## 2012-11-17 DIAGNOSIS — Z01818 Encounter for other preprocedural examination: Secondary | ICD-10-CM | POA: Insufficient documentation

## 2012-11-17 DIAGNOSIS — Z9889 Other specified postprocedural states: Secondary | ICD-10-CM

## 2012-11-17 DIAGNOSIS — C73 Malignant neoplasm of thyroid gland: Secondary | ICD-10-CM | POA: Insufficient documentation

## 2012-11-17 DIAGNOSIS — F3289 Other specified depressive episodes: Secondary | ICD-10-CM | POA: Insufficient documentation

## 2012-11-17 HISTORY — PX: THYROIDECTOMY: SHX17

## 2012-11-17 HISTORY — PX: TOTAL THYROIDECTOMY: SHX2547

## 2012-11-17 HISTORY — DX: Malignant neoplasm of thyroid gland: C73

## 2012-11-17 LAB — CALCIUM
Calcium: 9 mg/dL (ref 8.4–10.5)
Calcium: 9.1 mg/dL (ref 8.4–10.5)

## 2012-11-17 LAB — BASIC METABOLIC PANEL
BUN: 26 mg/dL — ABNORMAL HIGH (ref 6–23)
CO2: 28 mEq/L (ref 19–32)
Calcium: 9.5 mg/dL (ref 8.4–10.5)
Chloride: 102 mEq/L (ref 96–112)
Creatinine, Ser: 1.56 mg/dL — ABNORMAL HIGH (ref 0.50–1.10)
GFR calc Af Amer: 41 mL/min — ABNORMAL LOW (ref >90)
GFR calc non Af Amer: 36 mL/min — ABNORMAL LOW (ref >90)
Glucose, Bld: 84 mg/dL (ref 70–99)
Potassium: 3.5 mEq/L (ref 3.5–5.1)
Sodium: 139 mEq/L (ref 135–145)

## 2012-11-17 LAB — CBC
HCT: 33.9 % — ABNORMAL LOW (ref 36.0–46.0)
Hemoglobin: 11.5 g/dL — ABNORMAL LOW (ref 12.0–15.0)
MCH: 28.6 pg (ref 26.0–34.0)
MCHC: 33.9 g/dL (ref 30.0–36.0)
MCV: 84.3 fL (ref 78.0–100.0)
Platelets: 206 10*3/uL (ref 150–400)
RBC: 4.02 MIL/uL (ref 3.87–5.11)
RDW: 13.9 % (ref 11.5–15.5)
WBC: 4.7 10*3/uL (ref 4.0–10.5)

## 2012-11-17 SURGERY — THYROIDECTOMY
Anesthesia: General | Site: Neck | Laterality: Bilateral | Wound class: Clean

## 2012-11-17 MED ORDER — SIMVASTATIN 40 MG PO TABS
40.0000 mg | ORAL_TABLET | Freq: Every day | ORAL | Status: DC
  Administered 2012-11-17: 40 mg via ORAL
  Filled 2012-11-17 (×2): qty 1

## 2012-11-17 MED ORDER — CALCITRIOL 0.25 MCG PO CAPS
0.2500 ug | ORAL_CAPSULE | ORAL | Status: DC
  Administered 2012-11-17: 0.25 ug via ORAL
  Filled 2012-11-17 (×2): qty 1

## 2012-11-17 MED ORDER — ROCURONIUM BROMIDE 100 MG/10ML IV SOLN
INTRAVENOUS | Status: DC | PRN
  Administered 2012-11-17: 40 mg via INTRAVENOUS

## 2012-11-17 MED ORDER — MORPHINE SULFATE 2 MG/ML IJ SOLN
2.0000 mg | INTRAMUSCULAR | Status: DC | PRN
  Administered 2012-11-17: 4 mg via INTRAVENOUS
  Administered 2012-11-17 – 2012-11-18 (×2): 2 mg via INTRAVENOUS
  Filled 2012-11-17: qty 1

## 2012-11-17 MED ORDER — MORPHINE SULFATE 2 MG/ML IJ SOLN
INTRAMUSCULAR | Status: AC
  Filled 2012-11-17: qty 1

## 2012-11-17 MED ORDER — ONDANSETRON HCL 4 MG/2ML IJ SOLN
INTRAMUSCULAR | Status: DC | PRN
  Administered 2012-11-17: 4 mg via INTRAVENOUS

## 2012-11-17 MED ORDER — KETOROLAC TROMETHAMINE 30 MG/ML IJ SOLN
15.0000 mg | Freq: Once | INTRAMUSCULAR | Status: AC | PRN
  Administered 2012-11-17: 30 mg via INTRAVENOUS

## 2012-11-17 MED ORDER — LORATADINE 10 MG PO TABS
10.0000 mg | ORAL_TABLET | Freq: Every day | ORAL | Status: DC
  Administered 2012-11-17 – 2012-11-18 (×2): 10 mg via ORAL
  Filled 2012-11-17 (×2): qty 1

## 2012-11-17 MED ORDER — LIDOCAINE HCL (CARDIAC) 20 MG/ML IV SOLN
INTRAVENOUS | Status: DC | PRN
  Administered 2012-11-17: 50 mg via INTRAVENOUS
  Administered 2012-11-17: 10 mg via INTRAVENOUS

## 2012-11-17 MED ORDER — MEPERIDINE HCL 25 MG/ML IJ SOLN: 6.2500 mg | INTRAMUSCULAR | Status: DC | PRN

## 2012-11-17 MED ORDER — INFLUENZA VAC SPLIT QUAD 0.5 ML IM SUSP
0.5000 mL | INTRAMUSCULAR | Status: AC
  Administered 2012-11-18: 0.5 mL via INTRAMUSCULAR
  Filled 2012-11-17: qty 0.5

## 2012-11-17 MED ORDER — KETOROLAC TROMETHAMINE 30 MG/ML IJ SOLN
INTRAMUSCULAR | Status: AC
  Filled 2012-11-17: qty 1

## 2012-11-17 MED ORDER — PROMETHAZINE HCL 25 MG/ML IJ SOLN: 6.2500 mg | INTRAMUSCULAR | Status: DC | PRN

## 2012-11-17 MED ORDER — LEVETIRACETAM 750 MG PO TABS
750.0000 mg | ORAL_TABLET | Freq: Two times a day (BID) | ORAL | Status: DC
  Administered 2012-11-17 – 2012-11-18 (×2): 750 mg via ORAL
  Filled 2012-11-17 (×4): qty 1

## 2012-11-17 MED ORDER — FENTANYL CITRATE 0.05 MG/ML IJ SOLN
25.0000 ug | INTRAMUSCULAR | Status: DC | PRN
  Administered 2012-11-17 (×2): 50 ug via INTRAVENOUS

## 2012-11-17 MED ORDER — LIDOCAINE-EPINEPHRINE 1 %-1:100000 IJ SOLN
INTRAMUSCULAR | Status: DC | PRN
  Administered 2012-11-17: 5 mL via INTRADERMAL

## 2012-11-17 MED ORDER — OXYCODONE-ACETAMINOPHEN 5-325 MG PO TABS
1.0000 | ORAL_TABLET | ORAL | Status: DC | PRN
  Administered 2012-11-17: 2 via ORAL
  Administered 2012-11-18: 1 via ORAL
  Filled 2012-11-17: qty 1

## 2012-11-17 MED ORDER — MIDAZOLAM HCL 2 MG/2ML IJ SOLN: 0.5000 mg | Freq: Once | INTRAMUSCULAR | Status: DC | PRN

## 2012-11-17 MED ORDER — LACTATED RINGERS IV SOLN
INTRAVENOUS | Status: DC | PRN
  Administered 2012-11-17 (×2): via INTRAVENOUS

## 2012-11-17 MED ORDER — KCL IN DEXTROSE-NACL 20-5-0.45 MEQ/L-%-% IV SOLN
INTRAVENOUS | Status: DC
  Administered 2012-11-17 – 2012-11-18 (×2): via INTRAVENOUS
  Filled 2012-11-17 (×4): qty 1000

## 2012-11-17 MED ORDER — PROPOFOL 10 MG/ML IV BOLUS
INTRAVENOUS | Status: DC | PRN
  Administered 2012-11-17: 200 mg via INTRAVENOUS

## 2012-11-17 MED ORDER — OXYCODONE-ACETAMINOPHEN 5-325 MG PO TABS
ORAL_TABLET | ORAL | Status: AC
  Filled 2012-11-17: qty 2

## 2012-11-17 MED ORDER — PROMETHAZINE HCL 25 MG RE SUPP: 25.0000 mg | Freq: Four times a day (QID) | RECTAL | Status: DC | PRN

## 2012-11-17 MED ORDER — BENAZEPRIL HCL 20 MG PO TABS
20.0000 mg | ORAL_TABLET | Freq: Every day | ORAL | Status: DC
  Administered 2012-11-17 – 2012-11-18 (×2): 20 mg via ORAL
  Filled 2012-11-17 (×2): qty 1

## 2012-11-17 MED ORDER — EPHEDRINE SULFATE 50 MG/ML IJ SOLN
INTRAMUSCULAR | Status: DC | PRN
  Administered 2012-11-17: 5 mg via INTRAVENOUS
  Administered 2012-11-17: 2.5 mg via INTRAVENOUS

## 2012-11-17 MED ORDER — FENTANYL CITRATE 0.05 MG/ML IJ SOLN
INTRAMUSCULAR | Status: DC | PRN
  Administered 2012-11-17: 50 ug via INTRAVENOUS
  Administered 2012-11-17 (×2): 100 ug via INTRAVENOUS

## 2012-11-17 MED ORDER — PANTOPRAZOLE SODIUM 40 MG PO TBEC
40.0000 mg | DELAYED_RELEASE_TABLET | Freq: Every day | ORAL | Status: DC
  Administered 2012-11-17 – 2012-11-18 (×2): 40 mg via ORAL
  Filled 2012-11-17 (×2): qty 1

## 2012-11-17 MED ORDER — PROMETHAZINE HCL 25 MG PO TABS: 25.0000 mg | ORAL_TABLET | Freq: Four times a day (QID) | ORAL | Status: DC | PRN

## 2012-11-17 MED ORDER — DEXAMETHASONE SODIUM PHOSPHATE 4 MG/ML IJ SOLN
INTRAMUSCULAR | Status: DC | PRN
  Administered 2012-11-17: 4 mg via INTRAVENOUS

## 2012-11-17 MED ORDER — 0.9 % SODIUM CHLORIDE (POUR BTL) OPTIME
TOPICAL | Status: DC | PRN
  Administered 2012-11-17: 1000 mL

## 2012-11-17 MED ORDER — HYDROCHLOROTHIAZIDE 12.5 MG PO CAPS
12.5000 mg | ORAL_CAPSULE | Freq: Every day | ORAL | Status: DC
Start: 2012-11-17 — End: 2012-11-18
  Administered 2012-11-17 – 2012-11-18 (×2): 12.5 mg via ORAL
  Filled 2012-11-17 (×2): qty 1

## 2012-11-17 MED ORDER — FENTANYL CITRATE 0.05 MG/ML IJ SOLN
INTRAMUSCULAR | Status: AC
  Filled 2012-11-17: qty 2

## 2012-11-17 MED ORDER — MORPHINE SULFATE 4 MG/ML IJ SOLN
INTRAMUSCULAR | Status: AC
  Filled 2012-11-17: qty 1

## 2012-11-17 MED ORDER — LAMOTRIGINE 100 MG PO TABS
100.0000 mg | ORAL_TABLET | Freq: Every day | ORAL | Status: DC
  Administered 2012-11-17 – 2012-11-18 (×2): 100 mg via ORAL
  Filled 2012-11-17 (×2): qty 1

## 2012-11-17 MED ORDER — LIDOCAINE-EPINEPHRINE 1 %-1:100000 IJ SOLN
INTRAMUSCULAR | Status: AC
  Filled 2012-11-17: qty 1

## 2012-11-17 MED ORDER — NEOSTIGMINE METHYLSULFATE 1 MG/ML IJ SOLN
INTRAMUSCULAR | Status: DC | PRN
  Administered 2012-11-17: 4 mg via INTRAVENOUS

## 2012-11-17 MED ORDER — ARTIFICIAL TEARS OP OINT
TOPICAL_OINTMENT | OPHTHALMIC | Status: DC | PRN
  Administered 2012-11-17: 1 via OPHTHALMIC

## 2012-11-17 MED ORDER — CEFAZOLIN SODIUM 1-5 GM-% IV SOLN
1.0000 g | Freq: Three times a day (TID) | INTRAVENOUS | Status: DC
  Administered 2012-11-17 – 2012-11-18 (×3): 1 g via INTRAVENOUS
  Filled 2012-11-17 (×6): qty 50

## 2012-11-17 MED ORDER — MIDAZOLAM HCL 5 MG/5ML IJ SOLN
INTRAMUSCULAR | Status: DC | PRN
  Administered 2012-11-17 (×2): 1 mg via INTRAVENOUS

## 2012-11-17 MED ORDER — CEFAZOLIN SODIUM-DEXTROSE 2-3 GM-% IV SOLR
2.0000 g | Freq: Once | INTRAVENOUS | Status: AC
  Administered 2012-11-17: 2 g via INTRAVENOUS
  Filled 2012-11-17: qty 50

## 2012-11-17 MED ORDER — FLUOXETINE HCL 10 MG PO CAPS
10.0000 mg | ORAL_CAPSULE | Freq: Every day | ORAL | Status: DC
  Administered 2012-11-17 – 2012-11-18 (×2): 10 mg via ORAL
  Filled 2012-11-17 (×2): qty 1

## 2012-11-17 MED ORDER — GLYCOPYRROLATE 0.2 MG/ML IJ SOLN
INTRAMUSCULAR | Status: DC | PRN
  Administered 2012-11-17: .8 mg via INTRAVENOUS

## 2012-11-17 SURGICAL SUPPLY — 42 items
ADH SKN CLS APL DERMABOND .7 (GAUZE/BANDAGES/DRESSINGS) ×1
ATTRACTOMAT 16X20 MAGNETIC DRP (DRAPES) ×2 IMPLANT
BLADE SURG CLIPPER 3M 9600 (MISCELLANEOUS) IMPLANT
CANISTER SUCTION 2500CC (MISCELLANEOUS) ×2 IMPLANT
CLEANER TIP ELECTROSURG 2X2 (MISCELLANEOUS) ×2 IMPLANT
CLIP TI WIDE RED SMALL 24 (CLIP) IMPLANT
CLOTH BEACON ORANGE TIMEOUT ST (SAFETY) ×2 IMPLANT
CONT SPEC 4OZ CLIKSEAL STRL BL (MISCELLANEOUS) ×2 IMPLANT
CORDS BIPOLAR (ELECTRODE) ×2 IMPLANT
COVER SURGICAL LIGHT HANDLE (MISCELLANEOUS) ×2 IMPLANT
CRADLE DONUT ADULT HEAD (MISCELLANEOUS) ×2 IMPLANT
DERMABOND ADVANCED (GAUZE/BANDAGES/DRESSINGS) ×1
DERMABOND ADVANCED .7 DNX12 (GAUZE/BANDAGES/DRESSINGS) ×1 IMPLANT
DRAIN CHANNEL 10F 3/8 F FF (DRAIN) ×2 IMPLANT
ELECT COATED BLADE 2.86 ST (ELECTRODE) ×2 IMPLANT
ELECT REM PT RETURN 9FT ADLT (ELECTROSURGICAL) ×2
ELECTRODE REM PT RTRN 9FT ADLT (ELECTROSURGICAL) ×1 IMPLANT
EVACUATOR SILICONE 100CC (DRAIN) ×2 IMPLANT
GAUZE SPONGE 4X4 16PLY XRAY LF (GAUZE/BANDAGES/DRESSINGS) IMPLANT
GLOVE BIO SURGEON STRL SZ 6.5 (GLOVE) ×2 IMPLANT
GLOVE BIOGEL PI IND STRL 6.5 (GLOVE) ×1 IMPLANT
GLOVE BIOGEL PI INDICATOR 6.5 (GLOVE) ×1
GLOVE ECLIPSE 7.5 STRL STRAW (GLOVE) ×2 IMPLANT
GOWN STRL NON-REIN LRG LVL3 (GOWN DISPOSABLE) ×6 IMPLANT
HEMOSTAT SURGICEL .5X2 ABSORB (HEMOSTASIS) IMPLANT
KIT BASIN OR (CUSTOM PROCEDURE TRAY) ×2 IMPLANT
KIT ROOM TURNOVER OR (KITS) ×2 IMPLANT
LOCATOR NERVE 3 VOLT (DISPOSABLE) ×2 IMPLANT
NS IRRIG 1000ML POUR BTL (IV SOLUTION) ×2 IMPLANT
PAD ARMBOARD 7.5X6 YLW CONV (MISCELLANEOUS) ×4 IMPLANT
PENCIL BUTTON HOLSTER BLD 10FT (ELECTRODE) ×2 IMPLANT
SHEARS HARMONIC 9CM CVD (BLADE) ×2 IMPLANT
SPONGE INTESTINAL PEANUT (DISPOSABLE) ×2 IMPLANT
SUT ETHILON 2 0 FS 18 (SUTURE) ×2 IMPLANT
SUT SILK 2 0 FS (SUTURE) ×2 IMPLANT
SUT SILK 3 0 REEL (SUTURE) ×2 IMPLANT
SUT VICRYL 4-0 PS2 18IN ABS (SUTURE) ×2 IMPLANT
TOWEL OR 17X24 6PK STRL BLUE (TOWEL DISPOSABLE) ×2 IMPLANT
TOWEL OR 17X26 10 PK STRL BLUE (TOWEL DISPOSABLE) ×2 IMPLANT
TRAY ENT MC OR (CUSTOM PROCEDURE TRAY) ×2 IMPLANT
TRAY FOLEY CATH 14FRSI W/METER (CATHETERS) ×2 IMPLANT
WATER STERILE IRR 1000ML POUR (IV SOLUTION) IMPLANT

## 2012-11-17 NOTE — H&P (Signed)
H&P Update  Pt's original H&P dated 10/28/12 reviewed and placed in chart (to be scanned).  I personally examined the patient today.  No change in health. Proceed with total thyroidectomy.

## 2012-11-17 NOTE — Preoperative (Signed)
Beta Blockers   Reason not to administer Beta Blockers:Not Applicable 

## 2012-11-17 NOTE — Anesthesia Preprocedure Evaluation (Signed)
Anesthesia Evaluation  Patient identified by MRN, date of birth, ID band Patient awake    Reviewed: Allergy & Precautions, H&P , Patient's Chart, lab work & pertinent test results, reviewed documented beta blocker date and time   History of Anesthesia Complications Negative for: history of anesthetic complications  Airway Mallampati: III TM Distance: >3 FB Neck ROM: full    Dental no notable dental hx.    Pulmonary neg pulmonary ROS,  breath sounds clear to auscultation  Pulmonary exam normal       Cardiovascular Exercise Tolerance: Good hypertension, negative cardio ROS  Rhythm:regular Rate:Normal     Neuro/Psych Seizures -,  PSYCHIATRIC DISORDERS Depression negative neurological ROS  negative psych ROS   GI/Hepatic negative GI ROS, Neg liver ROS,   Endo/Other  negative endocrine ROS  Renal/GU negative Renal ROS     Musculoskeletal   Abdominal   Peds  Hematology negative hematology ROS (+)   Anesthesia Other Findings   Reproductive/Obstetrics negative OB ROS                           Anesthesia Physical Anesthesia Plan  ASA: III  Anesthesia Plan: General ETT   Post-op Pain Management:    Induction:   Airway Management Planned:   Additional Equipment:   Intra-op Plan:   Post-operative Plan:   Informed Consent: I have reviewed the patients History and Physical, chart, labs and discussed the procedure including the risks, benefits and alternatives for the proposed anesthesia with the patient or authorized representative who has indicated his/her understanding and acceptance.   Dental Advisory Given  Plan Discussed with: CRNA and Surgeon  Anesthesia Plan Comments:         Anesthesia Quick Evaluation

## 2012-11-17 NOTE — Op Note (Signed)
[name], [name]               ACCOUNT NO.:  [id number]  MEDICAL RECORD NO.:  [id number]  LOCATION:  [id number]                        FACILITY:  [facility]  PHYSICIAN:  Leta Baptist, MD            DATE OF BIRTH:  1955-07-11  DATE OF PROCEDURE:  11/17/2012 DATE OF DISCHARGE:                              OPERATIVE REPORT   SURGEON:  Leta Baptist, MD  PREOPERATIVE DIAGNOSIS:  Papillary thyroid carcinoma.  POSTOPERATIVE DIAGNOSIS:  Papillary thyroid carcinoma.  PROCEDURE PERFORMED:  Total thyroidectomy.  ANESTHESIA:  General endotracheal tube anesthesia.  ASSISTANT:  Rometta Emery, PA-C  ESTIMATED BLOOD LOSS:  Minimal.  COMPLICATIONS:  None.  INDICATION FOR PROCEDURE:  The patient is a 57 year old female with a history of multinodular thyroid goiter.  She previously underwent fine needle aspiration biopsy of thyroid nodules.  Her right thyroid nodule was consistent with papillary thyroid carcinoma.  Based on the above findings, the decision was made for the patient to undergo the total thyroidectomy surgery.  The risks, benefits, alternatives, and details of the procedure were discussed with the patient.  Questions were invited and answered.  Informed consent was obtained.  DESCRIPTION:  The patient was taken to the operating room and placed supine on the operating table.  General endotracheal tube anesthesia was administered by the anesthesiologist.  The patient was positioned and prepped and draped in a standard fashion for thyroidectomy surgery.  A 1% lidocaine with 1:100,000 epinephrine was injected at the planned site of incision.  A transverse low neck incision was made.  The incision was carried down past the level of the platysma muscles. Superior and inferiorly based subplatysmal flaps were elevated in a standard fashion.  The strap muscles were divided at midline and retracted laterally.  The thyroid gland was visualized.  An enlarged right thyroid nodule was noted.  Careful  dissection was then performed to free the right thyroid lobe from the surrounding soft tissue.  The middle thyroid vein was ligated.  The recurrent laryngeal nerve was identified and preserved.  The nerve was noted to be functional throughout the case.  The entire right thyroid gland was then reflected medially, and freed from its underlying attachment to the trachea.  The same procedure was repeated on the left side.  The entire thyroid gland was removed and sent to the Pathology Department for permanent histologic identification.  The surgical site was copiously irrigated.  A #10 JP drain was placed. The incision was closed in layers with 4-0 Vicryl and Dermabond.  The care of the patient was turned over to the anesthesiologist.  The patient was awakened from anesthesia without difficulty.  She was extubated and transferred to the recovery room in good condition.  OPERATIVE FINDINGS:  The total thyroidectomy was performed.  An enlarged right thyroid nodule was noted.  SPECIMEN:  Total thyroid.  FOLLOWUP CARE:  The patient will be admitted for overnight observation. Her calcium level will be checked every 8 hours.  She will be discharged home once her calcium level is stable.     Leta Baptist, MD     ST/MEDQ  D:  11/17/2012  T:  11/17/2012  Job:  [id number]  cc:   Norwood Levo. Moshe Cipro, M.D.

## 2012-11-17 NOTE — Transfer of Care (Signed)
Immediate Anesthesia Transfer of Care Note  Patient: [name]  Procedure(s) Performed: Procedure(s): TOTAL THYROIDECTOMY (Bilateral)  Patient Location: PACU  Anesthesia Type:General  Level of Consciousness: patient cooperative and responds to stimulation  Airway & Oxygen Therapy: Patient Spontanous Breathing and Patient connected to nasal cannula oxygen  Post-op Assessment: Report given to PACU RN, Post -op Vital signs reviewed and stable and Patient moving all extremities X 4  Post vital signs: Reviewed and stable  Complications: No apparent anesthesia complications

## 2012-11-17 NOTE — Brief Op Note (Signed)
11/17/2012  10:54 AM  PATIENT:  [name]  57 y.o. female  PRE-OPERATIVE DIAGNOSIS:  THYROID CANCER  POST-OPERATIVE DIAGNOSIS:  THYROID CANCER  PROCEDURE:  Procedure(s): TOTAL THYROIDECTOMY (Bilateral)  SURGEON:  Surgeon(s) and Role:    * Ascencion Dike, MD - Primary  PHYSICIAN ASSISTANT:   ASSISTANTS: Rometta Emery, PA-C   ANESTHESIA:   general  EBL:  Total I/O In: 1000 [I.V.:1000] Out: 275 [Urine:275]  BLOOD ADMINISTERED:none  DRAINS: (#10) Jackson-Pratt drain(s) with closed bulb suction in the neck   LOCAL MEDICATIONS USED:  LIDOCAINE   SPECIMEN:  Source of Specimen:  Total thyroid  DISPOSITION OF SPECIMEN:  PATHOLOGY  COUNTS:  YES  TOURNIQUET:  * No tourniquets in log *  DICTATION: .Other Dictation: Dictation Number [id number]  PLAN OF CARE: Admit for overnight observation  PATIENT DISPOSITION:  PACU - hemodynamically stable.   Delay start of Pharmacological VTE agent (>24hrs) due to surgical blood loss or risk of bleeding: not applicable

## 2012-11-17 NOTE — Anesthesia Postprocedure Evaluation (Signed)
  Anesthesia Post Note  Patient: [name]  Procedure(s) Performed: Procedure(s) (LRB): TOTAL THYROIDECTOMY (Bilateral)  Anesthesia type: GA  Patient location: PACU  Post pain: Pain level controlled  Post assessment: Post-op Vital signs reviewed  Last Vitals:  Filed Vitals:   11/17/12 1200  BP: 147/85  Pulse: 66  Temp:   Resp: 15    Post vital signs: Reviewed  Level of consciousness: sedated  Complications: No apparent anesthesia complications

## 2012-11-18 ENCOUNTER — Encounter (HOSPITAL_COMMUNITY): Payer: Self-pay | Admitting: Otolaryngology

## 2012-11-18 LAB — CALCIUM
Calcium: 9.3 mg/dL (ref 8.4–10.5)
Calcium: 9.3 mg/dL (ref 8.4–10.5)

## 2012-11-18 LAB — CALCIUM, IONIZED: Calcium, Ion: 1.3 mmol/L — ABNORMAL HIGH (ref 1.12–1.23)

## 2012-11-18 MED ORDER — OXYCODONE-ACETAMINOPHEN 5-325 MG PO TABS: 1.0000 | ORAL_TABLET | ORAL | Status: DC | PRN

## 2012-11-18 MED ORDER — AMOXICILLIN-POT CLAVULANATE 875-125 MG PO TABS: 1.0000 | ORAL_TABLET | Freq: Two times a day (BID) | ORAL | Status: AC

## 2012-11-18 MED ORDER — LEVOTHYROXINE SODIUM 100 MCG PO TABS: 100.0000 ug | ORAL_TABLET | Freq: Every day | ORAL | Status: DC

## 2012-11-18 NOTE — Discharge Summary (Signed)
Physician Discharge Summary  Patient ID: [name] MRN: [id number] DOB/AGE: 1955-07-29 57 y.o.  Admit date: 11/17/2012 Discharge date: 11/18/2012  Admission Diagnoses: Thyroid carcinoma  Discharge Diagnoses: Thyroid Carcinoma Active Problems:   * No active hospital problems. *   Discharged Condition: good  Hospital Course: Pt had an uneventful overnight stay. Pt tolerated po well. No bleeding. No stridor. Voice is strong.  Calcium is stable 9.0 -> 9.1 -> 9.3.   Consults: None  Significant Diagnostic Studies: None  Treatments: surgery: Total thyroidectomy  Discharge Exam: Blood pressure 151/79, pulse 92, temperature 98.5 F (36.9 C), temperature source Oral, resp. rate 20, height 5\' 5"  (1.651 m), weight 103.899 kg (229 lb 0.9 oz), SpO2 98.00%. Incision/Wound: Clean, dry, and intact. Voice is strong. No significant erythema.  Disposition: 01-Home or Self Care  Discharge Orders   Future Appointments Provider Department Dept Phone   01/24/2013 9:00 AM [name], MD [facility] [phone]   Future Orders Complete By Expires   Diet general  As directed    Increase activity slowly  As directed        Medication List    STOP taking these medications       aspirin EC 81 MG tablet      TAKE these medications       amoxicillin-clavulanate 875-125 MG per tablet  Commonly known as:  AUGMENTIN  Take 1 tablet by mouth 2 (two) times daily.     benazepril 20 MG tablet  Commonly known as:  LOTENSIN  Take 20 mg by mouth daily.     calcitRIOL 0.25 MCG capsule  Commonly known as:  ROCALTROL  Take 0.25 mcg by mouth 3 (three) times a week.     FLUoxetine 10 MG capsule  Commonly known as:  PROZAC  Take 10 mg by mouth daily.     hydrochlorothiazide 12.5 MG capsule  Commonly known as:  MICROZIDE  Take 12.5 mg by mouth daily.     lamoTRIgine 100 MG tablet  Commonly known as:  LAMICTAL  Take 100 mg by mouth daily.     levETIRAcetam 750 MG  tablet  Commonly known as:  KEPPRA  Take 750 mg by mouth 2 (two) times daily.     loratadine 10 MG tablet  Commonly known as:  CLARITIN  Take 10 mg by mouth daily.     omeprazole 20 MG capsule  Commonly known as:  PRILOSEC  Take 20 mg by mouth daily.     oxyCODONE-acetaminophen 5-325 MG per tablet  Commonly known as:  PERCOCET/ROXICET  Take 1-2 tablets by mouth every 4 (four) hours as needed.     simvastatin 40 MG tablet  Commonly known as:  ZOCOR  Take 40 mg by mouth at bedtime.           Follow-up Information   Follow up with DR Aria Pickrell [facility] 11/25/2012. (at 1pm)    Contact information:   [address] [place] [address]       Signed: Ascencion Dike 11/18/2012, 10:47 AM

## 2012-11-18 NOTE — Progress Notes (Signed)
Discharge instructions gone over with patient and her sister, pt. Able to teach back information.  Rx given to patient with instructions to take her full course of antibiotics, verbalized understanding.  Also instructed to take Synthroid daily, verbalized understanding.  Patient verbalized follow-up appointments.  Patient taken to entrance of [facility] to be transported home by sister.

## 2012-11-25 ENCOUNTER — Ambulatory Visit (INDEPENDENT_AMBULATORY_CARE_PROVIDER_SITE_OTHER): Payer: Medicaid Other | Admitting: Otolaryngology

## 2012-12-25 ENCOUNTER — Other Ambulatory Visit: Payer: Self-pay | Admitting: Family Medicine

## 2012-12-27 ENCOUNTER — Other Ambulatory Visit: Payer: Self-pay | Admitting: Family Medicine

## 2013-01-05 ENCOUNTER — Other Ambulatory Visit (HOSPITAL_COMMUNITY): Payer: Self-pay | Admitting: "Endocrinology

## 2013-01-05 DIAGNOSIS — C73 Malignant neoplasm of thyroid gland: Secondary | ICD-10-CM

## 2013-01-12 ENCOUNTER — Encounter (HOSPITAL_COMMUNITY)
Admission: RE | Admit: 2013-01-12 | Discharge: 2013-01-12 | Disposition: A | Discharge status: Home / Self Care | Payer: Medicaid Other | Source: Ambulatory Visit | Attending: "Endocrinology | Admitting: "Endocrinology

## 2013-01-12 ENCOUNTER — Encounter (HOSPITAL_COMMUNITY): Payer: Self-pay

## 2013-01-12 DIAGNOSIS — C73 Malignant neoplasm of thyroid gland: Secondary | ICD-10-CM

## 2013-01-12 MED ORDER — THYROTROPIN ALFA 1.1 MG IM SOLR
INTRAMUSCULAR | Status: AC
  Administered 2013-01-12: 0.9 mg via INTRAMUSCULAR
  Filled 2013-01-12: qty 0.9

## 2013-01-12 MED ORDER — THYROTROPIN ALFA 1.1 MG IM SOLR
0.9000 mg | INTRAMUSCULAR | Status: AC
  Administered 2013-01-12: 0.9 mg via INTRAMUSCULAR

## 2013-01-13 ENCOUNTER — Encounter (HOSPITAL_COMMUNITY)
Admission: RE | Admit: 2013-01-13 | Discharge: 2013-01-13 | Disposition: A | Discharge status: Home / Self Care | Payer: Medicaid Other | Source: Ambulatory Visit | Attending: "Endocrinology | Admitting: "Endocrinology

## 2013-01-13 MED ORDER — THYROTROPIN ALFA 1.1 MG IM SOLR
0.9000 mg | INTRAMUSCULAR | Status: AC
  Administered 2013-01-13: 0.9 mg via INTRAMUSCULAR

## 2013-01-14 ENCOUNTER — Encounter (HOSPITAL_COMMUNITY): Payer: Self-pay

## 2013-01-14 ENCOUNTER — Encounter (HOSPITAL_COMMUNITY)
Admission: RE | Admit: 2013-01-14 | Discharge: 2013-01-14 | Disposition: A | Discharge status: Home / Self Care | Payer: Medicaid Other | Source: Ambulatory Visit | Attending: "Endocrinology | Admitting: "Endocrinology

## 2013-01-14 MED ORDER — SODIUM IODIDE I 131 CAPSULE
100.0000 | Freq: Once | INTRAVENOUS | Status: AC | PRN
  Administered 2013-01-14: 100 via ORAL

## 2013-01-24 ENCOUNTER — Encounter (HOSPITAL_COMMUNITY)
Admission: RE | Admit: 2013-01-24 | Discharge: 2013-01-24 | Disposition: A | Discharge status: Home / Self Care | Payer: Medicaid Other | Source: Ambulatory Visit | Attending: "Endocrinology | Admitting: "Endocrinology

## 2013-01-24 ENCOUNTER — Encounter (HOSPITAL_COMMUNITY): Payer: Self-pay

## 2013-01-24 ENCOUNTER — Ambulatory Visit: Payer: Medicaid Other | Admitting: Family Medicine

## 2013-01-24 DIAGNOSIS — C73 Malignant neoplasm of thyroid gland: Secondary | ICD-10-CM | POA: Insufficient documentation

## 2013-02-14 ENCOUNTER — Telehealth: Payer: Self-pay | Admitting: Family Medicine

## 2013-02-20 NOTE — Telephone Encounter (Signed)
Will addrss at 01/05 visit, nothing on current med  list is addressing this issue currently

## 2013-02-21 NOTE — Telephone Encounter (Signed)
Patient aware.

## 2013-02-23 ENCOUNTER — Other Ambulatory Visit: Payer: Self-pay | Admitting: Family Medicine

## 2013-02-28 ENCOUNTER — Encounter: Payer: Self-pay | Admitting: Family Medicine

## 2013-02-28 ENCOUNTER — Encounter (INDEPENDENT_AMBULATORY_CARE_PROVIDER_SITE_OTHER): Payer: Self-pay

## 2013-02-28 ENCOUNTER — Ambulatory Visit (INDEPENDENT_AMBULATORY_CARE_PROVIDER_SITE_OTHER): Payer: Medicaid Other | Admitting: Family Medicine

## 2013-02-28 VITALS — BP 144/88 | HR 86 | Resp 16 | Ht 65.5 in | Wt 227.0 lb

## 2013-02-28 DIAGNOSIS — R569 Unspecified convulsions: Secondary | ICD-10-CM

## 2013-02-28 DIAGNOSIS — F70 Mild intellectual disabilities: Secondary | ICD-10-CM

## 2013-02-28 DIAGNOSIS — F329 Major depressive disorder, single episode, unspecified: Secondary | ICD-10-CM

## 2013-02-28 DIAGNOSIS — F3289 Other specified depressive episodes: Secondary | ICD-10-CM

## 2013-02-28 DIAGNOSIS — E669 Obesity, unspecified: Secondary | ICD-10-CM

## 2013-02-28 DIAGNOSIS — C73 Malignant neoplasm of thyroid gland: Secondary | ICD-10-CM

## 2013-02-28 DIAGNOSIS — I10 Essential (primary) hypertension: Secondary | ICD-10-CM

## 2013-02-28 DIAGNOSIS — E785 Hyperlipidemia, unspecified: Secondary | ICD-10-CM

## 2013-02-28 LAB — COMPLETE METABOLIC PANEL WITH GFR
ALT: <8 U/L (ref 0–35)
AST: 15 U/L (ref 0–37)
Albumin: 3.9 g/dL (ref 3.5–5.2)
Alkaline Phosphatase: 115 U/L (ref 39–117)
BUN: 18 mg/dL (ref 6–23)
CO2: 27 mEq/L (ref 19–32)
Calcium: 9.9 mg/dL (ref 8.4–10.5)
Chloride: 102 mEq/L (ref 96–112)
Creat: 1.37 mg/dL — ABNORMAL HIGH (ref 0.50–1.10)
GFR, Est African American: 49 mL/min — ABNORMAL LOW
GFR, Est Non African American: 43 mL/min — ABNORMAL LOW
Glucose, Bld: 85 mg/dL (ref 70–99)
Potassium: 3.7 mEq/L (ref 3.5–5.3)
Sodium: 140 mEq/L (ref 135–145)
Total Bilirubin: 0.7 mg/dL (ref 0.3–1.2)
Total Protein: 7.9 g/dL (ref 6.0–8.3)

## 2013-02-28 LAB — LIPID PANEL
Cholesterol: 192 mg/dL (ref 0–200)
HDL: 47 mg/dL (ref >39)
LDL Cholesterol: 127 mg/dL — ABNORMAL HIGH (ref 0–99)
Total CHOL/HDL Ratio: 4.1 Ratio
Triglycerides: 91 mg/dL (ref <150)
VLDL: 18 mg/dL (ref 0–40)

## 2013-02-28 MED ORDER — SIMVASTATIN 40 MG PO TABS: ORAL_TABLET | ORAL | Status: DC

## 2013-02-28 MED ORDER — BENAZEPRIL HCL 20 MG PO TABS: ORAL_TABLET | ORAL | Status: DC

## 2013-02-28 MED ORDER — HYDROCHLOROTHIAZIDE 25 MG PO TABS: 25.0000 mg | ORAL_TABLET | Freq: Every day | ORAL | Status: DC

## 2013-02-28 MED ORDER — OMEPRAZOLE 20 MG PO CPDR: DELAYED_RELEASE_CAPSULE | ORAL | Status: DC

## 2013-02-28 MED ORDER — FLUOXETINE HCL 10 MG PO CAPS: ORAL_CAPSULE | ORAL | Status: DC

## 2013-02-28 NOTE — Progress Notes (Signed)
   Subjective:    Patient ID: [name], female    DOB: 12-Jul-1955, 58 y.o.   MRN: [id number]  HPI The PT is here for follow up and re-evaluation of chronic medical conditions, medication management and review of any available recent lab and radiology data.  Preventive health is updated, specifically  Cancer screening and Immunization.   Questions or concerns regarding consultations or procedures which the PT has had in the interim are  Addressed.Followed regularly by ENT and endo and neurology The PT denies any adverse reactions to current medications since the last visit.  C/o recent increase in anxiety and requests short term request for night sitter       Review of Systems See HPI Denies recent fever or chills. Denies sinus pressure, nasal congestion, ear pain or sore throat. Denies chest congestion, productive cough or wheezing. Denies chest pains, palpitations and leg swelling Denies abdominal pain, nausea, vomiting,diarrhea or constipation.   Denies dysuria, frequency, hesitancy or incontinence. Denies joint pain, swelling and limitation in mobility. Denies headaches, seizures, numbness, or tingling. Denies depression, anxiety or insomnia. Denies skin break down or rash.        Objective:   Physical Exam BP 144/88  Pulse 86  Resp 16  Ht 5' 5.5" (1.664 m)  Wt 227 lb (102.967 kg)  BMI 37.19 kg/m2  SpO2 98% Patient alert and oriented and in no cardiopulmonary distress.  HEENT: No facial asymmetry, EOMI, no sinus tenderness,  oropharynx pink and moist.  Neck supple no adenopathy.  Chest: Clear to auscultation bilaterally.  CVS: S1, S2 no murmurs, no S3.  ABD: Soft non tender. Bowel sounds normal.  Ext: No edema  MS: Adequate ROM spine, shoulders, hips and knees.  Skin: Intact, no ulcerations or rash noted.  Psych: Good eye contact, normal affect. Memory intact not anxious or depressed appearing.  CNS: CN 2-12 intact, power, tone and sensation normal  throughout.        Assessment & Plan:  HYPERTENSION Uncontrolled, increase in med dose DASH diet and commitment to daily physical activity for a minimum of 30 minutes discussed and encouraged, as a part of hypertension management. The importance of attaining a healthy weight is also discussed.   DEPRESSION Controlled, no change in medication   OBESITY Improved. Pt applauded on succesful weight loss through lifestyle change, and encouraged to continue same. Weight loss goal set for the next several months.   SEIZURE DISORDER Controlled, followed by neurology. Short term need for person to stay with her due to increased anxiety  HYPERLIPIDEMIA Uncontrolled, elevated LDL No med change Hyperlipidemia:Low fat diet discussed and encouraged.    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

## 2013-02-28 NOTE — Patient Instructions (Addendum)
CPE in 2 month  Letter will be prepared and sent today requestingthat you be allowed to have someone stay in your apt at night for the next 2 months, due to current medical condition  Lipid , cmp and EGFR today  New higher dose of HCTZ 25 mg one daily to be started when you  Finish taking current hCTZ 12.5 mg one daily, pharmacy ansd your nurse are aware.  Stop loratidine, since you do not need this  Please start daily exercise walk 30 mins every day and eat less to lose weight

## 2013-03-01 ENCOUNTER — Telehealth: Payer: Self-pay | Admitting: Family Medicine

## 2013-03-01 ENCOUNTER — Other Ambulatory Visit: Payer: Self-pay | Admitting: Family Medicine

## 2013-03-01 NOTE — Telephone Encounter (Signed)
Noted  

## 2013-03-02 ENCOUNTER — Other Ambulatory Visit: Payer: Self-pay

## 2013-03-02 MED ORDER — SIMVASTATIN 80 MG PO TABS: 80.0000 mg | ORAL_TABLET | Freq: Every day | ORAL | Status: DC

## 2013-03-24 ENCOUNTER — Ambulatory Visit (INDEPENDENT_AMBULATORY_CARE_PROVIDER_SITE_OTHER): Payer: Medicaid Other | Admitting: Otolaryngology

## 2013-03-24 ENCOUNTER — Other Ambulatory Visit: Payer: Self-pay | Admitting: Family Medicine

## 2013-03-24 DIAGNOSIS — C73 Malignant neoplasm of thyroid gland: Secondary | ICD-10-CM

## 2013-04-12 DIAGNOSIS — C73 Malignant neoplasm of thyroid gland: Secondary | ICD-10-CM | POA: Insufficient documentation

## 2013-04-12 NOTE — Assessment & Plan Note (Signed)
Improved. Pt applauded on succesful weight loss through lifestyle change, and encouraged to continue same. Weight loss goal set for the next several months.  

## 2013-04-12 NOTE — Assessment & Plan Note (Signed)
Controlled, no change in medication  

## 2013-04-12 NOTE — Assessment & Plan Note (Signed)
Short term request for caregiver to stay overnight per pt request due to inc anxiety

## 2013-04-12 NOTE — Assessment & Plan Note (Signed)
Controlled, followed by neurology. Short term need for person to stay with her due to increased anxiety

## 2013-04-12 NOTE — Assessment & Plan Note (Addendum)
Uncontrolled, increase in med dose DASH diet and commitment to daily physical activity for a minimum of 30 minutes discussed and encouraged, as a part of hypertension management. The importance of attaining a healthy weight is also discussed.

## 2013-04-12 NOTE — Assessment & Plan Note (Signed)
Replacement thyroid med managed by endocrine

## 2013-04-12 NOTE — Assessment & Plan Note (Signed)
Uncontrolled, elevated LDL No med change Hyperlipidemia:Low fat diet discussed and encouraged.

## 2013-05-03 ENCOUNTER — Other Ambulatory Visit (HOSPITAL_COMMUNITY)
Admission: RE | Admit: 2013-05-03 | Discharge: 2013-05-03 | Disposition: A | Discharge status: Home / Self Care | Payer: Medicaid Other | Source: Ambulatory Visit | Attending: Family Medicine | Admitting: Family Medicine

## 2013-05-03 ENCOUNTER — Encounter: Payer: Self-pay | Admitting: Family Medicine

## 2013-05-03 ENCOUNTER — Ambulatory Visit (INDEPENDENT_AMBULATORY_CARE_PROVIDER_SITE_OTHER): Payer: Medicaid Other | Admitting: Family Medicine

## 2013-05-03 ENCOUNTER — Encounter (INDEPENDENT_AMBULATORY_CARE_PROVIDER_SITE_OTHER): Payer: Self-pay

## 2013-05-03 VITALS — BP 118/70 | HR 76 | Resp 18 | Ht 65.5 in | Wt 233.0 lb

## 2013-05-03 DIAGNOSIS — E785 Hyperlipidemia, unspecified: Secondary | ICD-10-CM

## 2013-05-03 DIAGNOSIS — H547 Unspecified visual loss: Secondary | ICD-10-CM

## 2013-05-03 DIAGNOSIS — Z01419 Encounter for gynecological examination (general) (routine) without abnormal findings: Secondary | ICD-10-CM | POA: Insufficient documentation

## 2013-05-03 DIAGNOSIS — Z1151 Encounter for screening for human papillomavirus (HPV): Secondary | ICD-10-CM | POA: Insufficient documentation

## 2013-05-03 DIAGNOSIS — Z1212 Encounter for screening for malignant neoplasm of rectum: Secondary | ICD-10-CM

## 2013-05-03 DIAGNOSIS — Z0000 Encounter for general adult medical examination without abnormal findings: Secondary | ICD-10-CM

## 2013-05-03 DIAGNOSIS — Z1211 Encounter for screening for malignant neoplasm of colon: Secondary | ICD-10-CM

## 2013-05-03 DIAGNOSIS — E669 Obesity, unspecified: Secondary | ICD-10-CM

## 2013-05-03 LAB — POC HEMOCCULT BLD/STL (OFFICE/1-CARD/DIAGNOSTIC): Fecal Occult Blood, POC: NEGATIVE

## 2013-05-03 NOTE — Patient Instructions (Addendum)
F/u in 3 month, please call if you need me before  Fasting lipid and  cmp in 3 month  Pap sent   We will refer you for limited help at home due to unsteady gait and high fall risk  Fall Prevention and Home Safety Falls cause injuries and can affect all age groups. It is possible to prevent falls.  HOW TO PREVENT FALLS  Wear shoes with rubber soles that do not have an opening for your toes.  Keep the inside and outside of your house well lit.  Use night lights throughout your home.  Remove clutter from floors.  Clean up floor spills.  Remove throw rugs or fasten them to the floor with carpet tape.  Do not place electrical cords across pathways.  Put grab bars by your tub, shower, and toilet. Do not use towel bars as grab bars.  Put handrails on both sides of the stairway. Fix loose handrails.  Do not climb on stools or stepladders, if possible.  Do not wax your floors.  Repair uneven or unsafe sidewalks, walkways, or stairs.  Keep items you use a lot within reach.  Be aware of pets.  Keep emergency numbers next to the telephone.  Put smoke detectors in your home and near bedrooms. Ask your doctor what other things you can do to prevent falls. Document Released: 12/07/2008 Document Revised: 08/12/2011 Document Reviewed: 05/13/2011 [facility] Patient Information 2014 [place], [place].

## 2013-05-04 ENCOUNTER — Other Ambulatory Visit: Payer: Self-pay

## 2013-05-04 MED ORDER — LEVOTHYROXINE SODIUM 100 MCG PO TABS: 100.0000 ug | ORAL_TABLET | Freq: Every day | ORAL | Status: DC

## 2013-05-04 MED ORDER — LORATADINE 10 MG PO TABS: ORAL_TABLET | ORAL | Status: DC

## 2013-05-15 NOTE — Progress Notes (Signed)
   Subjective:    Patient ID: [name], female    DOB: Jul 29, 1955, 58 y.o.   MRN: [id number]  HPI Patient is in for annual exam Health maintainance is reviewed and updated, specifically screening tests and recommended immunizations. Recent lab and radiologic data, since previous visit is also reviewed with the patient. Healthy lifestyle as far as commitment to regular physical activity, heart healthy diet , safe habits, as far as seat belt and helmet use,safe and responsible sex involving condom and contraceptive use , also safe storage of firearms is discussed. The importance of adequate rest is also discussed.       Review of Systems See HPI Denies recent fever or chills. Denies sinus pressure, nasal congestion, ear pain or sore throat. Denies chest congestion, productive cough or wheezing. Denies chest pains, palpitations and leg swelling Denies abdominal pain, nausea, vomiting,diarrhea or constipation.   Denies dysuria, frequency, hesitancy or incontinence. Denies joint pain, swelling and limitation in mobility. Denies headaches, seizures, numbness, or tingling. Denies depression, anxiety or insomnia. Denies skin break down or rash.        Objective:   Physical Exam BP 118/70  Pulse 76  Resp 18  Ht 5' 5.5" (1.664 m)  Wt 233 lb (105.688 kg)  BMI 38.17 kg/m2  SpO2 99% Pleasant well nourished female, alert and oriented x 3, in no cardio-pulmonary distress. Afebrile. HEENT No facial trauma or asymetry. Sinuses non tender.  EOMI, PERTL, fundoscopic exam  no hemorhage or exudate.  External ears normal, tympanic membranes clear. Oropharynx moist, no exudate, poor  dentition. Neck: supple, no adenopathy,JVD or thyromegaly.No bruits.  Chest: Clear to ascultation bilaterally.No crackles or wheezes. Non tender to palpation  Breast: No asymetry,no masses. No nipple discharge or inversion. No axillary or supraclavicular adenopathy  Cardiovascular system; Heart  sounds normal,  S1 and  S2 ,no S3.  No murmur, or thrill. Apical beat not displaced Peripheral pulses normal.  Abdomen: Soft, non tender, no organomegaly or masses. No bruits. Bowel sounds normal. No guarding, tenderness or rebound.  Rectal:  No mass. Guaiac negative stool.  GU: External genitalia normal. No lesions. Vaginal canal normal.malodorous discharge. Uterus absent, no adnexal masses, no  adnexal tenderness.  Musculoskeletal exam: Full ROM of spine, hips , shoulders and knees. No deformity ,swelling or crepitus noted. No muscle wasting or atrophy.   Neurologic: Cranial nerves 2 to 12 intact. Power, tone ,sensation and reflexes normal throughout. No disturbance in gait. No tremor.  Skin: Intact, no ulceration, erythema , scaling or rash noted. Pigmentation normal throughout  Psych; Normal mood and affect. Judgement and concentration normal        Assessment & Plan:  Routine general medical examination at a health care facility Annual exam as documented. Counseling done  re healthy lifestyle involving commitment to 150 minutes exercise per week, heart healthy diet, and attaining healthy weight.The importance of adequate sleep also discussed. Regular seat belt use , is also discussed. Changes in health habits are decided on by the patient with goals and time frames  set for achieving them. Immunization and cancer screening needs are specifically addressed at this visit.

## 2013-05-19 ENCOUNTER — Other Ambulatory Visit: Payer: Self-pay | Admitting: Family Medicine

## 2013-05-20 NOTE — Telephone Encounter (Signed)
Do you prescribe this for this patient?

## 2013-05-23 DIAGNOSIS — R569 Unspecified convulsions: Secondary | ICD-10-CM

## 2013-05-23 DIAGNOSIS — R946 Abnormal results of thyroid function studies: Secondary | ICD-10-CM

## 2013-05-23 DIAGNOSIS — F609 Personality disorder, unspecified: Secondary | ICD-10-CM

## 2013-05-23 DIAGNOSIS — I10 Essential (primary) hypertension: Secondary | ICD-10-CM

## 2013-05-29 NOTE — Assessment & Plan Note (Signed)
Annual exam as documented. Counseling done  re healthy lifestyle involving commitment to 150 minutes exercise per week, heart healthy diet, and attaining healthy weight.The importance of adequate sleep also discussed. Regular seat belt use , is also discussed. Changes in health habits are decided on by the patient with goals and time frames  set for achieving them. Immunization and cancer screening needs are specifically addressed at this visit.

## 2013-06-20 ENCOUNTER — Other Ambulatory Visit: Payer: Self-pay | Admitting: Family Medicine

## 2013-07-05 ENCOUNTER — Telehealth: Payer: Self-pay | Admitting: Family Medicine

## 2013-07-06 NOTE — Telephone Encounter (Signed)
Noted thanks for update will address at visit

## 2013-07-12 ENCOUNTER — Encounter: Payer: Self-pay | Admitting: Family Medicine

## 2013-07-12 ENCOUNTER — Encounter (INDEPENDENT_AMBULATORY_CARE_PROVIDER_SITE_OTHER): Payer: Self-pay

## 2013-07-12 ENCOUNTER — Ambulatory Visit (INDEPENDENT_AMBULATORY_CARE_PROVIDER_SITE_OTHER): Payer: Medicaid Other | Admitting: Family Medicine

## 2013-07-12 VITALS — BP 112/80 | HR 80 | Resp 16 | Wt 220.1 lb

## 2013-07-12 DIAGNOSIS — F79 Unspecified intellectual disabilities: Secondary | ICD-10-CM

## 2013-07-12 DIAGNOSIS — Z79899 Other long term (current) drug therapy: Secondary | ICD-10-CM

## 2013-07-12 DIAGNOSIS — R569 Unspecified convulsions: Secondary | ICD-10-CM

## 2013-07-12 DIAGNOSIS — R5381 Other malaise: Secondary | ICD-10-CM

## 2013-07-12 DIAGNOSIS — E785 Hyperlipidemia, unspecified: Secondary | ICD-10-CM

## 2013-07-12 DIAGNOSIS — F329 Major depressive disorder, single episode, unspecified: Secondary | ICD-10-CM

## 2013-07-12 DIAGNOSIS — F3289 Other specified depressive episodes: Secondary | ICD-10-CM

## 2013-07-12 DIAGNOSIS — R946 Abnormal results of thyroid function studies: Secondary | ICD-10-CM

## 2013-07-12 DIAGNOSIS — R5383 Other fatigue: Secondary | ICD-10-CM

## 2013-07-12 DIAGNOSIS — Z113 Encounter for screening for infections with a predominantly sexual mode of transmission: Secondary | ICD-10-CM

## 2013-07-12 DIAGNOSIS — I10 Essential (primary) hypertension: Secondary | ICD-10-CM

## 2013-07-12 DIAGNOSIS — E890 Postprocedural hypothyroidism: Secondary | ICD-10-CM

## 2013-07-12 DIAGNOSIS — R443 Hallucinations, unspecified: Secondary | ICD-10-CM

## 2013-07-12 LAB — CBC WITH DIFFERENTIAL/PLATELET
Basophils Absolute: 0 10*3/uL (ref 0.0–0.1)
Basophils Relative: 1 % (ref 0–1)
Eosinophils Absolute: 0 10*3/uL (ref 0.0–0.7)
Eosinophils Relative: 1 % (ref 0–5)
HCT: 34.6 % — ABNORMAL LOW (ref 36.0–46.0)
Hemoglobin: 11.7 g/dL — ABNORMAL LOW (ref 12.0–15.0)
Lymphocytes Relative: 31 % (ref 12–46)
Lymphs Abs: 1.1 10*3/uL (ref 0.7–4.0)
MCH: 28.7 pg (ref 26.0–34.0)
MCHC: 33.8 g/dL (ref 30.0–36.0)
MCV: 85 fL (ref 78.0–100.0)
Monocytes Absolute: 0.4 10*3/uL (ref 0.1–1.0)
Monocytes Relative: 10 % (ref 3–12)
Neutro Abs: 2.1 10*3/uL (ref 1.7–7.7)
Neutrophils Relative %: 57 % (ref 43–77)
Platelets: 201 10*3/uL (ref 150–400)
RBC: 4.07 MIL/uL (ref 3.87–5.11)
RDW: 15.4 % (ref 11.5–15.5)
WBC: 3.6 10*3/uL — ABNORMAL LOW (ref 4.0–10.5)

## 2013-07-12 MED ORDER — BENAZEPRIL HCL 20 MG PO TABS: ORAL_TABLET | ORAL | Status: DC

## 2013-07-12 MED ORDER — SIMVASTATIN 80 MG PO TABS: 80.0000 mg | ORAL_TABLET | Freq: Every day | ORAL | Status: DC

## 2013-07-12 MED ORDER — HYDROCHLOROTHIAZIDE 25 MG PO TABS: 25.0000 mg | ORAL_TABLET | Freq: Every day | ORAL | Status: DC

## 2013-07-12 MED ORDER — OMEPRAZOLE 20 MG PO CPDR: DELAYED_RELEASE_CAPSULE | ORAL | Status: DC

## 2013-07-12 MED ORDER — FLUOXETINE HCL 10 MG PO CAPS
ORAL_CAPSULE | ORAL | Status: DC
Start: 2013-07-12 — End: 2013-11-21

## 2013-07-12 NOTE — Patient Instructions (Addendum)
F/u in 8 to 10 weeks, call if you need me  Before  TSH, cmp and CBc today, b12 and RPR, we will call with lab results   You will be referred for an mRI of your brain due to hallucinations, new onset, abnormal behavior, seizures, and excessive anxiety  No change in BP medication that is good

## 2013-07-13 ENCOUNTER — Other Ambulatory Visit: Payer: Self-pay | Admitting: Family Medicine

## 2013-07-13 LAB — COMPREHENSIVE METABOLIC PANEL
ALT: 8 U/L (ref 0–35)
AST: 16 U/L (ref 0–37)
Albumin: 4.1 g/dL (ref 3.5–5.2)
Alkaline Phosphatase: 114 U/L (ref 39–117)
BUN: 21 mg/dL (ref 6–23)
CO2: 23 mEq/L (ref 19–32)
Calcium: 9.4 mg/dL (ref 8.4–10.5)
Chloride: 102 mEq/L (ref 96–112)
Creat: 1.34 mg/dL — ABNORMAL HIGH (ref 0.50–1.10)
Glucose, Bld: 82 mg/dL (ref 70–99)
Potassium: 3.5 mEq/L (ref 3.5–5.3)
Sodium: 140 mEq/L (ref 135–145)
Total Bilirubin: 0.6 mg/dL (ref 0.2–1.2)
Total Protein: 7.9 g/dL (ref 6.0–8.3)

## 2013-07-13 LAB — TSH: TSH: 0.291 u[IU]/mL — ABNORMAL LOW (ref 0.350–4.500)

## 2013-07-13 LAB — RPR

## 2013-07-13 LAB — VITAMIN B12: Vitamin B-12: 490 pg/mL (ref 211–911)

## 2013-07-13 MED ORDER — LEVOTHYROXINE SODIUM 75 MCG PO TABS: 75.0000 ug | ORAL_TABLET | Freq: Every day | ORAL | Status: DC

## 2013-07-22 ENCOUNTER — Ambulatory Visit (HOSPITAL_COMMUNITY)
Admission: RE | Admit: 2013-07-22 | Discharge: 2013-07-22 | Disposition: A | Discharge status: Home / Self Care | Payer: Medicaid Other | Source: Ambulatory Visit | Attending: Family Medicine | Admitting: Family Medicine

## 2013-07-22 DIAGNOSIS — R443 Hallucinations, unspecified: Secondary | ICD-10-CM

## 2013-07-22 DIAGNOSIS — R569 Unspecified convulsions: Secondary | ICD-10-CM

## 2013-07-22 DIAGNOSIS — G319 Degenerative disease of nervous system, unspecified: Secondary | ICD-10-CM | POA: Insufficient documentation

## 2013-08-04 ENCOUNTER — Encounter: Payer: Self-pay | Admitting: Family Medicine

## 2013-08-04 ENCOUNTER — Ambulatory Visit (INDEPENDENT_AMBULATORY_CARE_PROVIDER_SITE_OTHER): Payer: Medicaid Other | Admitting: Family Medicine

## 2013-08-04 VITALS — BP 106/66 | HR 70 | Resp 18 | Ht 65.5 in | Wt 220.0 lb

## 2013-08-04 DIAGNOSIS — F3289 Other specified depressive episodes: Secondary | ICD-10-CM

## 2013-08-04 DIAGNOSIS — E559 Vitamin D deficiency, unspecified: Secondary | ICD-10-CM

## 2013-08-04 DIAGNOSIS — I10 Essential (primary) hypertension: Secondary | ICD-10-CM

## 2013-08-04 DIAGNOSIS — E890 Postprocedural hypothyroidism: Secondary | ICD-10-CM

## 2013-08-04 DIAGNOSIS — E785 Hyperlipidemia, unspecified: Secondary | ICD-10-CM

## 2013-08-04 DIAGNOSIS — C73 Malignant neoplasm of thyroid gland: Secondary | ICD-10-CM

## 2013-08-04 DIAGNOSIS — F329 Major depressive disorder, single episode, unspecified: Secondary | ICD-10-CM

## 2013-08-04 DIAGNOSIS — R569 Unspecified convulsions: Secondary | ICD-10-CM

## 2013-08-04 MED ORDER — CALCIUM CARBONATE-VITAMIN D 500-200 MG-UNIT PO TABS: 1.0000 | ORAL_TABLET | Freq: Two times a day (BID) | ORAL | Status: DC

## 2013-08-04 NOTE — Patient Instructions (Addendum)
F/u in 4 month, call if you need me before  Glad you are better  Fasting lipid, cmp , TSH , vit D, 2nd week in July  bP is great  STOP  CALCITROL, insrtead start calcium with D twice daily

## 2013-08-05 DIAGNOSIS — E890 Postprocedural hypothyroidism: Secondary | ICD-10-CM | POA: Insufficient documentation

## 2013-08-05 NOTE — Assessment & Plan Note (Signed)
Controlled, no change in medication DASH diet and commitment to daily physical activity for a minimum of 30 minutes discussed and encouraged, as a part of hypertension management. The importance of attaining a healthy weight is also discussed.  

## 2013-08-05 NOTE — Assessment & Plan Note (Signed)
Stable managed by neurology 

## 2013-08-05 NOTE — Assessment & Plan Note (Signed)
Uncontrolled , elevated LDL  Hyperlipidemia:Low fat diet discussed and encouraged.

## 2013-08-05 NOTE — Assessment & Plan Note (Signed)
Controlled , denies any hallucinations at this time, reports mental stability, no recent calls from concerned pastor

## 2013-08-05 NOTE — Assessment & Plan Note (Signed)
Recently over corrected and symptomatic , updated lab at reduced dose when due, pt state s she has recovered

## 2013-08-05 NOTE — Assessment & Plan Note (Signed)
updated lab to be checked to determine further need for supplement

## 2013-08-05 NOTE — Progress Notes (Signed)
   Subjective:    Patient ID: [name], female    DOB: 1955-06-06, 58 y.o.   MRN: [id number]  HPI The PT is here for follow up and re-evaluation of chronic medical conditions, medication management and review of any available recent lab and radiology data.  Preventive health is updated, specifically  Cancer screening and Immunization.   Questions or concerns regarding consultations or procedures which the PT has had in the interim are  addressed. The PT denies any adverse reactions to current medications since the last visit. States she feels much better with reduced dose of synthroid There are no new concerns.  There are no specific complaints       Review of Systems See HPI Denies recent fever or chills. Denies sinus pressure, nasal congestion, ear pain or sore throat. Denies chest congestion, productive cough or wheezing. Denies chest pains, palpitations and leg swelling Denies abdominal pain, nausea, vomiting,diarrhea or constipation.   Denies dysuria, frequency, hesitancy or incontinence. Denies joint pain, swelling and limitation in mobility. Denies uncontrolled  headaches, seizures, numbness, or tingling. Denies uncontrolled depression, anxiety or insomnia. Denies skin break down or rash.        Objective:   Physical Exam BP 106/66  Pulse 70  Resp 18  Ht 5' 5.5" (1.664 m)  Wt 220 lb (99.791 kg)  BMI 36.04 kg/m2  SpO2 98%  Patient alert and oriented and in no cardiopulmonary distress.  HEENT: No facial asymmetry, EOMI,   oropharynx pink and moist.  Neck supple no JVD, no mass.  Chest: Clear to auscultation bilaterally.  CVS: S1, S2 no murmurs, no S3.  ABD: Soft non tender.   Ext: No edema  MS: Adequate ROM spine, shoulders, hips and knees.  Skin: Intact, no ulcerations or rash noted.  Psych: Good eye contact, normal affect. Memory intact not anxious or depressed appearing.  CNS: CN 2-12 intact, power,  normal throughout.no focal deficits  noted.        Assessment & Plan:  HYPERTENSION Controlled, no change in medication DASH diet and commitment to daily physical activity for a minimum of 30 minutes discussed and encouraged, as a part of hypertension management. The importance of attaining a healthy weight is also discussed.   Hypothyroidism, postsurgical Recently over corrected and symptomatic , updated lab at reduced dose when due, pt state s she has recovered  SEIZURE DISORDER Stable managed by neurology  HYPERLIPIDEMIA Uncontrolled , elevated LDL  Hyperlipidemia:Low fat diet discussed and encouraged.    DEPRESSION Controlled , denies any hallucinations at this time, reports mental stability, no recent calls from concerned pastor  VITAMIN D DEFICIENCY updated lab to be checked to determine further need for supplement

## 2013-08-08 NOTE — Assessment & Plan Note (Signed)
New onset paranoia with hallucinations and increased anxiety, established h/o seizures , needs brain scan to evaluate for new intracranial  pathology causing symptoms

## 2013-08-08 NOTE — Assessment & Plan Note (Signed)
Uncontrolled due to elevated LDL Hyperlipidemia:Low fat diet discussed and encouraged.  Updated lab needed at/ before next visit.

## 2013-08-08 NOTE — Progress Notes (Signed)
   Subjective:    Patient ID: [name], female    DOB: 12-11-1955, 58 y.o.   MRN: [id number]  HPI Pt brought in due to concerns expressed by her pastor that she was "acting strangely' in recent times, hallucinating, and had become very paranoid. Not deemed a physical threat to herself or anyone. We did f/u with the nurse who sees her regualarly to fill meds and it was  Deemed safe for her to be seen the week following the call for eval. Pt states "ever since my thyroid surgery I have not been right and feels it is medicaiton related" C/o incrased anxiety, denies depression admits to being fearful to stay on her own She has had no seizures since her last visit   Review of Systems See HPI Denies recent fever or chills. Denies sinus pressure, nasal congestion, ear pain or sore throat. Denies chest congestion, productive cough or wheezing. Denies chest pains, palpitations and leg swelling Denies abdominal pain, nausea, vomiting,diarrhea or constipation.   Denies dysuria, frequency, hesitancy or incontinence. Denies joint pain, swelling and limitation in mobility.  Denies skin break down or rash.        Objective:   Physical Exam  BP 112/80  Pulse 80  Resp 16  Wt 220 lb 1.9 oz (99.846 kg)  SpO2 98% Patient alert and oriented and in no cardiopulmonary distress.  HEENT: No facial asymmetry, EOMI,   oropharynx pink and moist.  Neck supple no JVD, no mass.  Chest: Clear to auscultation bilaterally.  CVS: S1, S2 no murmurs, no S3.  ABD: Soft non tender.   Ext: No edema  MS: Adequate ROM spine, shoulders, hips and knees.  Skin: Intact, no ulcerations or rash noted.  Psych: Good eye contact, normal affect. Memory, mildly impaired,  anxious not  depressed appearing.  CNS: CN 2-12 intact, power,  normal throughout.no focal deficits noted.       Assessment & Plan:  SEIZURE DISORDER New onset paranoia with hallucinations and increased anxiety, established h/o  seizures , needs brain scan to evaluate for new intracranial  pathology causing symptoms  Hypothyroidism, postsurgical Over corrected, and pt having new mental health challenges, down tiotrate dose and f/u re symptoms and lab data  HYPERTENSION Controlled, no change in medication DASH diet and commitment to daily physical activity for a minimum of 30 minutes discussed and encouraged, as a part of hypertension management. The importance of attaining a healthy weight is also discussed.   DEPRESSION Controlled, no change in medication   HYPERLIPIDEMIA Uncontrolled due to elevated LDL Hyperlipidemia:Low fat diet discussed and encouraged.  Updated lab needed at/ before next visit.

## 2013-08-08 NOTE — Assessment & Plan Note (Signed)
Over corrected, and pt having new mental health challenges, down tiotrate dose and f/u re symptoms and lab data

## 2013-08-08 NOTE — Assessment & Plan Note (Signed)
Controlled, no change in medication  

## 2013-08-08 NOTE — Assessment & Plan Note (Signed)
Controlled, no change in medication DASH diet and commitment to daily physical activity for a minimum of 30 minutes discussed and encouraged, as a part of hypertension management. The importance of attaining a healthy weight is also discussed.  

## 2013-09-12 DIAGNOSIS — I10 Essential (primary) hypertension: Secondary | ICD-10-CM

## 2013-09-12 DIAGNOSIS — R569 Unspecified convulsions: Secondary | ICD-10-CM

## 2013-09-12 DIAGNOSIS — F79 Unspecified intellectual disabilities: Secondary | ICD-10-CM

## 2013-09-12 DIAGNOSIS — R946 Abnormal results of thyroid function studies: Secondary | ICD-10-CM

## 2013-09-22 ENCOUNTER — Ambulatory Visit (INDEPENDENT_AMBULATORY_CARE_PROVIDER_SITE_OTHER): Payer: Medicaid Other | Admitting: Otolaryngology

## 2013-11-02 ENCOUNTER — Other Ambulatory Visit (HOSPITAL_COMMUNITY): Payer: Self-pay | Admitting: "Endocrinology

## 2013-11-02 DIAGNOSIS — C73 Malignant neoplasm of thyroid gland: Secondary | ICD-10-CM

## 2013-11-07 ENCOUNTER — Ambulatory Visit (HOSPITAL_COMMUNITY): Admission: RE | Admit: 2013-11-07 | Payer: Medicaid Other | Source: Ambulatory Visit

## 2013-11-09 ENCOUNTER — Other Ambulatory Visit (HOSPITAL_COMMUNITY): Payer: Self-pay | Admitting: "Endocrinology

## 2013-11-09 DIAGNOSIS — F79 Unspecified intellectual disabilities: Secondary | ICD-10-CM

## 2013-11-09 DIAGNOSIS — C73 Malignant neoplasm of thyroid gland: Secondary | ICD-10-CM

## 2013-11-09 DIAGNOSIS — I10 Essential (primary) hypertension: Secondary | ICD-10-CM

## 2013-11-09 DIAGNOSIS — R569 Unspecified convulsions: Secondary | ICD-10-CM

## 2013-11-17 ENCOUNTER — Ambulatory Visit (HOSPITAL_COMMUNITY)
Admission: RE | Admit: 2013-11-17 | Discharge: 2013-11-17 | Disposition: A | Discharge status: Home / Self Care | Payer: Medicaid Other | Source: Ambulatory Visit | Attending: "Endocrinology | Admitting: "Endocrinology

## 2013-11-17 DIAGNOSIS — E0789 Other specified disorders of thyroid: Secondary | ICD-10-CM | POA: Diagnosis not present

## 2013-11-17 DIAGNOSIS — Z85850 Personal history of malignant neoplasm of thyroid: Secondary | ICD-10-CM | POA: Insufficient documentation

## 2013-11-17 DIAGNOSIS — C73 Malignant neoplasm of thyroid gland: Secondary | ICD-10-CM

## 2013-11-21 ENCOUNTER — Other Ambulatory Visit: Payer: Self-pay | Admitting: Family Medicine

## 2013-12-06 ENCOUNTER — Encounter: Payer: Self-pay | Admitting: Family Medicine

## 2013-12-06 ENCOUNTER — Ambulatory Visit (INDEPENDENT_AMBULATORY_CARE_PROVIDER_SITE_OTHER): Payer: Medicaid Other | Admitting: Family Medicine

## 2013-12-06 ENCOUNTER — Encounter (INDEPENDENT_AMBULATORY_CARE_PROVIDER_SITE_OTHER): Payer: Self-pay

## 2013-12-06 VITALS — BP 130/82 | HR 99 | Resp 16 | Ht 65.5 in | Wt 225.0 lb

## 2013-12-06 DIAGNOSIS — Z1239 Encounter for other screening for malignant neoplasm of breast: Secondary | ICD-10-CM

## 2013-12-06 DIAGNOSIS — E890 Postprocedural hypothyroidism: Secondary | ICD-10-CM

## 2013-12-06 DIAGNOSIS — IMO0001 Reserved for inherently not codable concepts without codable children: Secondary | ICD-10-CM

## 2013-12-06 DIAGNOSIS — I10 Essential (primary) hypertension: Secondary | ICD-10-CM

## 2013-12-06 DIAGNOSIS — R569 Unspecified convulsions: Secondary | ICD-10-CM

## 2013-12-06 DIAGNOSIS — F329 Major depressive disorder, single episode, unspecified: Secondary | ICD-10-CM

## 2013-12-06 DIAGNOSIS — E785 Hyperlipidemia, unspecified: Secondary | ICD-10-CM

## 2013-12-06 DIAGNOSIS — F32A Depression, unspecified: Secondary | ICD-10-CM

## 2013-12-06 DIAGNOSIS — Z1211 Encounter for screening for malignant neoplasm of colon: Secondary | ICD-10-CM | POA: Insufficient documentation

## 2013-12-06 DIAGNOSIS — Z23 Encounter for immunization: Secondary | ICD-10-CM

## 2013-12-06 DIAGNOSIS — F419 Anxiety disorder, unspecified: Secondary | ICD-10-CM

## 2013-12-06 DIAGNOSIS — F418 Other specified anxiety disorders: Secondary | ICD-10-CM

## 2013-12-06 LAB — LIPID PANEL
Cholesterol: 187 mg/dL (ref 0–200)
HDL: 50 mg/dL (ref >39)
LDL Cholesterol: 119 mg/dL — ABNORMAL HIGH (ref 0–99)
Total CHOL/HDL Ratio: 3.7 Ratio
Triglycerides: 90 mg/dL (ref <150)
VLDL: 18 mg/dL (ref 0–40)

## 2013-12-06 MED ORDER — SIMVASTATIN 80 MG PO TABS: 80.0000 mg | ORAL_TABLET | Freq: Every day | ORAL | Status: DC

## 2013-12-06 MED ORDER — LORATADINE 10 MG PO TABS: ORAL_TABLET | ORAL | Status: DC

## 2013-12-06 MED ORDER — HYDROCHLOROTHIAZIDE 25 MG PO TABS: 25.0000 mg | ORAL_TABLET | Freq: Every day | ORAL | Status: DC

## 2013-12-06 MED ORDER — LEVOTHYROXINE SODIUM 75 MCG PO TABS: 75.0000 ug | ORAL_TABLET | Freq: Every day | ORAL | Status: DC

## 2013-12-06 MED ORDER — FLUOXETINE HCL 10 MG PO CAPS: ORAL_CAPSULE | ORAL | Status: DC

## 2013-12-06 MED ORDER — BENAZEPRIL HCL 20 MG PO TABS: ORAL_TABLET | ORAL | Status: DC

## 2013-12-06 NOTE — Patient Instructions (Signed)
F/u in 4 month, call if you need me before  Flu vaccine today  Pls take script/ and we will fax script  For tdAP to [place], check and see the cost , you need this  Fasting lipid and cmp needed today ( after we see what was just done at Dr Dorris Fetch 09/28)   It is important that you exercise regularly at least 30 minutes 5 times a week. If you develop chest pain, have severe difficulty breathing, or feel very tired, stop exercising immediately and seek medical attention

## 2013-12-10 DIAGNOSIS — Z23 Encounter for immunization: Secondary | ICD-10-CM | POA: Insufficient documentation

## 2013-12-10 NOTE — Assessment & Plan Note (Signed)
Deteriorated. Patient re-educated about  the importance of commitment to a  minimum of 150 minutes of exercise per week. The importance of healthy food choices with portion control discussed. Encouraged to start a food diary, count calories and to consider  joining a support group. Sample diet sheets offered. Goals set by the patient for the next several months.    

## 2013-12-10 NOTE — Assessment & Plan Note (Signed)
Controlled, managed by neurology 

## 2013-12-10 NOTE — Assessment & Plan Note (Signed)
Vaccine administered at visit.  

## 2013-12-10 NOTE — Assessment & Plan Note (Signed)
Controlled, no change in medication  

## 2013-12-10 NOTE — Assessment & Plan Note (Signed)
Currently controlled, being followed by endo

## 2013-12-10 NOTE — Progress Notes (Signed)
   Subjective:    Patient ID: [name], female    DOB: September 01, 1955, 58 y.o.   MRN: [id number]  HPI The PT is here for follow up and re-evaluation of chronic medical conditions, medication management and review of any available recent lab and radiology data.  Preventive health is updated, specifically  Cancer screening and Immunization.   Questions or concerns regarding consultations or procedures which the PT has had in the interim are  addressed. The PT denies any adverse reactions to current medications since the last visit.  There are no new concerns.  There are no specific complaints       Review of Systems See HPI Denies recent fever or chills. Denies sinus pressure, nasal congestion, ear pain or sore throat. Denies chest congestion, productive cough or wheezing. Denies chest pains, palpitations and leg swelling Denies abdominal pain, nausea, vomiting,diarrhea or constipation.   Denies dysuria, frequency, hesitancy or incontinence. Denies joint pain, swelling and limitation in mobility. Denies headaches, seizures, numbness, or tingling. Denies depression, anxiety or insomnia. Denies skin break down or rash.        Objective:   Physical Exam  BP 130/82  Pulse 99  Resp 16  Ht 5' 5.5" (1.664 m)  Wt 225 lb (102.059 kg)  BMI 36.86 kg/m2  SpO2 95% Patient alert and oriented and in no cardiopulmonary distress.  HEENT: No facial asymmetry, EOMI,   oropharynx pink and moist.  Neck supple no JVD, no mass.  Chest: Clear to auscultation bilaterally.  CVS: S1, S2 no murmurs, no S3.Regular rate.  ABD: Soft non tender.   Ext: No edema  MS: Adequate ROM spine, shoulders, hips and knees.  Skin: Intact, no ulcerations or rash noted.  Psych: Good eye contact, normal affect. Memory intact not anxious or depressed appearing.  CNS: CN 2-12 intact, power,  normal throughout.no focal deficits noted.       Assessment & Plan:  Essential hypertension Controlled, no  change in medication DASH diet and commitment to daily physical activity for a minimum of 30 minutes discussed and encouraged, as a part of hypertension management. The importance of attaining a healthy weight is also discussed.   Hypothyroidism, postsurgical Currently controlled, being followed by endo  Anxiety and depression Controlled, no change in medication   Hyperlipidemia LDL goal <100 Improved, but still not at goal Hyperlipidemia:Low fat diet discussed and encouraged.  No med change  Obesity, Class II, BMI 35.0-39.9, with comorbidity (see actual BMI) Deteriorated. Patient re-educated about  the importance of commitment to a  minimum of 150 minutes of exercise per week. The importance of healthy food choices with portion control discussed. Encouraged to start a food diary, count calories and to consider  joining a support group. Sample diet sheets offered. Goals set by the patient for the next several months.     Need for prophylactic vaccination and inoculation against influenza Vaccine administered at visit.   Convulsions Controlled, managed by neurology

## 2013-12-10 NOTE — Assessment & Plan Note (Signed)
Controlled, no change in medication DASH diet and commitment to daily physical activity for a minimum of 30 minutes discussed and encouraged, as a part of hypertension management. The importance of attaining a healthy weight is also discussed.  

## 2013-12-10 NOTE — Assessment & Plan Note (Signed)
Improved, but still not at goal Hyperlipidemia:Low fat diet discussed and encouraged.  No med change

## 2013-12-15 ENCOUNTER — Ambulatory Visit (HOSPITAL_COMMUNITY)
Admission: RE | Admit: 2013-12-15 | Discharge: 2013-12-15 | Disposition: A | Discharge status: Home / Self Care | Payer: Medicaid Other | Source: Ambulatory Visit | Attending: Family Medicine | Admitting: Family Medicine

## 2013-12-15 DIAGNOSIS — Z1231 Encounter for screening mammogram for malignant neoplasm of breast: Secondary | ICD-10-CM | POA: Insufficient documentation

## 2013-12-15 DIAGNOSIS — Z1239 Encounter for other screening for malignant neoplasm of breast: Secondary | ICD-10-CM

## 2013-12-21 ENCOUNTER — Encounter: Payer: Self-pay | Admitting: "Endocrinology

## 2013-12-26 ENCOUNTER — Other Ambulatory Visit: Payer: Self-pay | Admitting: Family Medicine

## 2014-01-24 ENCOUNTER — Ambulatory Visit (INDEPENDENT_AMBULATORY_CARE_PROVIDER_SITE_OTHER): Payer: Medicaid Other | Admitting: Family Medicine

## 2014-01-24 ENCOUNTER — Encounter: Payer: Self-pay | Admitting: Family Medicine

## 2014-01-24 VITALS — BP 128/84 | HR 100 | Resp 18 | Ht 65.0 in | Wt 227.0 lb

## 2014-01-24 DIAGNOSIS — I10 Essential (primary) hypertension: Secondary | ICD-10-CM

## 2014-01-24 DIAGNOSIS — M549 Dorsalgia, unspecified: Secondary | ICD-10-CM | POA: Insufficient documentation

## 2014-01-24 MED ORDER — METHOCARBAMOL 500 MG PO TABS: ORAL_TABLET | ORAL | Status: DC

## 2014-01-24 MED ORDER — IBUPROFEN 800 MG PO TABS: 800.0000 mg | ORAL_TABLET | Freq: Three times a day (TID) | ORAL | Status: DC

## 2014-01-24 MED ORDER — PREDNISONE (PAK) 5 MG PO TABS: 5.0000 mg | ORAL_TABLET | ORAL | Status: DC

## 2014-01-24 NOTE — Patient Instructions (Signed)
F/u as before.  You will get injections for acute back pain and 3 medications are sent to your pharmacy  Be careful with bending, lifting and stooping

## 2014-01-24 NOTE — Progress Notes (Signed)
   Subjective:    Patient ID: [name], female    DOB: 12/25/1955, 58 y.o.   MRN: [id number]  HPI 3 day acute LBP following excessive  sweeping and bending, no other complaints, no acute weakness , numbness or incontinence.   Review of Systems See HPI Denies recent fever or chills. Denies sinus pressure, nasal congestion, ear pain or sore throat. Denies chest congestion, productive cough or wheezing. Denies chest pains, palpitations and leg swelling Denies abdominal pain, nausea, vomiting,diarrhea or constipation.   Denies dysuria, frequency, hesitancy or incontinence.  Denies headaches, seizures, numbness, or tingling.       Objective:   Physical Exam BP 128/84 mmHg  Pulse 100  Resp 18  Ht 5\' 5"  (1.651 m)  Wt 227 lb (102.967 kg)  BMI 37.77 kg/m2  SpO2 96% Patient alert and oriented and in no cardiopulmonary distress.Pt in pain  HEENT: No facial asymmetry, EOMI,   oropharynx pink and moist.  Neck supple no JVD, no mass.  Chest: Clear to auscultation bilaterally.  CVS: S1, S2 no murmurs, no S3.Regular rate.  ABD: Soft non tender.   Ext: No edema  [id number]  ROM lumbar  Spine, adequate in  shoulders, hips and knees.   CNS: CN 2-12 intact, power,  normal throughout.no focal deficits noted.        Assessment & Plan:  Back pain, acute Anti inflammatories in office and medication also prescribed  Essential hypertension Controlled, no change in medication

## 2014-01-25 DIAGNOSIS — M549 Dorsalgia, unspecified: Secondary | ICD-10-CM

## 2014-01-25 MED ORDER — KETOROLAC TROMETHAMINE 60 MG/2ML IM SOLN
60.0000 mg | Freq: Once | INTRAMUSCULAR | Status: AC
  Administered 2014-01-25: 60 mg via INTRAMUSCULAR

## 2014-01-25 MED ORDER — METHYLPREDNISOLONE ACETATE 80 MG/ML IJ SUSP
80.0000 mg | Freq: Once | INTRAMUSCULAR | Status: AC
  Administered 2014-01-25: 80 mg via INTRAMUSCULAR

## 2014-02-21 NOTE — Assessment & Plan Note (Signed)
Controlled, no change in medication  

## 2014-02-21 NOTE — Assessment & Plan Note (Signed)
Anti inflammatories in office and medication also prescribed

## 2014-03-03 DIAGNOSIS — R569 Unspecified convulsions: Secondary | ICD-10-CM

## 2014-03-03 DIAGNOSIS — I10 Essential (primary) hypertension: Secondary | ICD-10-CM

## 2014-03-03 DIAGNOSIS — F78 Other intellectual disabilities: Secondary | ICD-10-CM

## 2014-04-03 ENCOUNTER — Encounter: Payer: Self-pay | Admitting: Family Medicine

## 2014-04-03 ENCOUNTER — Ambulatory Visit (INDEPENDENT_AMBULATORY_CARE_PROVIDER_SITE_OTHER): Payer: Medicaid Other | Admitting: Family Medicine

## 2014-04-03 VITALS — BP 118/82 | HR 99 | Resp 16 | Ht 65.0 in | Wt 227.0 lb

## 2014-04-03 DIAGNOSIS — E890 Postprocedural hypothyroidism: Secondary | ICD-10-CM

## 2014-04-03 DIAGNOSIS — F32A Depression, unspecified: Secondary | ICD-10-CM

## 2014-04-03 DIAGNOSIS — F419 Anxiety disorder, unspecified: Secondary | ICD-10-CM

## 2014-04-03 DIAGNOSIS — Z23 Encounter for immunization: Secondary | ICD-10-CM | POA: Diagnosis not present

## 2014-04-03 DIAGNOSIS — Z1211 Encounter for screening for malignant neoplasm of colon: Secondary | ICD-10-CM

## 2014-04-03 DIAGNOSIS — F418 Other specified anxiety disorders: Secondary | ICD-10-CM

## 2014-04-03 DIAGNOSIS — I10 Essential (primary) hypertension: Secondary | ICD-10-CM

## 2014-04-03 DIAGNOSIS — F329 Major depressive disorder, single episode, unspecified: Secondary | ICD-10-CM

## 2014-04-03 DIAGNOSIS — E785 Hyperlipidemia, unspecified: Secondary | ICD-10-CM

## 2014-04-03 DIAGNOSIS — IMO0001 Reserved for inherently not codable concepts without codable children: Secondary | ICD-10-CM

## 2014-04-03 DIAGNOSIS — R569 Unspecified convulsions: Secondary | ICD-10-CM

## 2014-04-03 LAB — CBC WITH DIFFERENTIAL/PLATELET
Basophils Absolute: 0 10*3/uL (ref 0.0–0.1)
Basophils Relative: 1 % (ref 0–1)
Eosinophils Absolute: 0 10*3/uL (ref 0.0–0.7)
Eosinophils Relative: 1 % (ref 0–5)
HCT: 39.3 % (ref 36.0–46.0)
Hemoglobin: 12.8 g/dL (ref 12.0–15.0)
Lymphocytes Relative: 32 % (ref 12–46)
Lymphs Abs: 1.2 10*3/uL (ref 0.7–4.0)
MCH: 28.8 pg (ref 26.0–34.0)
MCHC: 32.6 g/dL (ref 30.0–36.0)
MCV: 88.5 fL (ref 78.0–100.0)
MPV: 10.1 fL (ref 8.6–12.4)
Monocytes Absolute: 0.4 10*3/uL (ref 0.1–1.0)
Monocytes Relative: 10 % (ref 3–12)
Neutro Abs: 2 10*3/uL (ref 1.7–7.7)
Neutrophils Relative %: 56 % (ref 43–77)
Platelets: 218 10*3/uL (ref 150–400)
RBC: 4.44 MIL/uL (ref 3.87–5.11)
RDW: 15.7 % — ABNORMAL HIGH (ref 11.5–15.5)
WBC: 3.6 10*3/uL — ABNORMAL LOW (ref 4.0–10.5)

## 2014-04-03 LAB — LIPID PANEL
Cholesterol: 203 mg/dL — ABNORMAL HIGH (ref 0–200)
HDL: 58 mg/dL (ref >39)
LDL Cholesterol: 131 mg/dL — ABNORMAL HIGH (ref 0–99)
Total CHOL/HDL Ratio: 3.5 Ratio
Triglycerides: 70 mg/dL (ref <150)
VLDL: 14 mg/dL (ref 0–40)

## 2014-04-03 LAB — COMPREHENSIVE METABOLIC PANEL
ALT: 10 U/L (ref 0–35)
AST: 21 U/L (ref 0–37)
Albumin: 4.1 g/dL (ref 3.5–5.2)
Alkaline Phosphatase: 117 U/L (ref 39–117)
BUN: 20 mg/dL (ref 6–23)
CO2: 26 mEq/L (ref 19–32)
Calcium: 9.8 mg/dL (ref 8.4–10.5)
Chloride: 101 mEq/L (ref 96–112)
Creat: 1.42 mg/dL — ABNORMAL HIGH (ref 0.50–1.10)
Glucose, Bld: 86 mg/dL (ref 70–99)
Potassium: 3.7 mEq/L (ref 3.5–5.3)
Sodium: 140 mEq/L (ref 135–145)
Total Bilirubin: 0.9 mg/dL (ref 0.2–1.2)
Total Protein: 8.1 g/dL (ref 6.0–8.3)

## 2014-04-03 MED ORDER — OMEPRAZOLE 20 MG PO CPDR: DELAYED_RELEASE_CAPSULE | ORAL | Status: DC

## 2014-04-03 NOTE — Patient Instructions (Signed)
F/U in 4 month, call if you need me before  Prevnar today  You are referred to Dr Oneida Alar for a colonoscopy, you need this   Fasting lipid, cmp, cBC, today   Work on weight loss please

## 2014-04-03 NOTE — Progress Notes (Signed)
   Subjective:    Patient ID: [name], female    DOB: 1955-12-16, 59 y.o.   MRN: [id number]  HPI The PT is here for follow up and re-evaluation of chronic medical conditions, medication management and review of any available recent lab and radiology data.  Preventive health is updated, specifically  Cancer screening and Immunization.   Questions or concerns regarding consultations or procedures which the PT has had in the interim are  addressed. The PT denies any adverse reactions to current medications since the last visit.  There are no new concerns.  There are no specific complaints  Denies any seizure activity since last visit Not committed to weight loss , with no change in weight, re educated with regard toi her need to lose weight      Review of Systems See HPI Denies recent fever or chills. Denies sinus pressure, nasal congestion, ear pain or sore throat. Denies chest congestion, productive cough or wheezing. Denies chest pains, palpitations and leg swelling Denies abdominal pain, nausea, vomiting,diarrhea or constipation.   Denies dysuria, frequency, hesitancy or incontinence. Denies joint pain, swelling and limitation in mobility. Denies headaches, seizures, numbness, or tingling. Denies depression, anxiety or insomnia. Denies skin break down or rash.        Objective:   Physical Exam BP 118/82 mmHg  Pulse 99  Resp 16  Ht 5\' 5"  (1.651 m)  Wt 227 lb (102.967 kg)  BMI 37.77 kg/m2  SpO2 96% Patient alert and oriented and in no cardiopulmonary distress.  HEENT: No facial asymmetry, EOMI,   oropharynx pink and moist.  Neck supple no JVD, no mass.  Chest: Clear to auscultation bilaterally.  CVS: S1, S2 no murmurs, no S3.Regular rate.  ABD: Soft non tender.   Ext: No edema  MS: Adequate ROM spine, shoulders, hips and knees.  Skin: Intact, no ulcerations or rash noted.  Psych: Good eye contact, normal affect. Memory impaired not anxious or  depressed appearing.  CNS: CN 2-12 intact, power,  normal throughout.no focal deficits noted.        Assessment & Plan:  Essential hypertension Controlled, no change in medication DASH diet and commitment to daily physical activity for a minimum of 30 minutes discussed and encouraged, as a part of hypertension management. The importance of attaining a healthy weight is also discussed.    Hypothyroidism, postsurgical Followed by endo, has upcoming appt reportedly   Convulsions Controlled and managed by neurology   Anxiety and depression Controlled, no change in medication    Hyperlipidemia LDL goal <100 Uncontrolled Hyperlipidemia:Low fat diet discussed and encouraged.  Updated lab needed at/ before next visit.    Obesity, Class II, BMI 35.0-39.9, with comorbidity (see actual BMI) Unchanged. Patient re-educated about  the importance of commitment to a  minimum of 150 minutes of exercise per week. The importance of healthy food choices with portion control discussed. Encouraged to start a food diary, count calories and to consider  joining a support group. Sample diet sheets offered. Goals set by the patient for the next several months.      Need for vaccination with 13-polyvalent pneumococcal conjugate vaccine Vaccine administered at visit.

## 2014-04-10 DIAGNOSIS — Z23 Encounter for immunization: Secondary | ICD-10-CM | POA: Insufficient documentation

## 2014-04-10 NOTE — Assessment & Plan Note (Signed)
Controlled, no change in medication  

## 2014-04-10 NOTE — Assessment & Plan Note (Signed)
Followed by endo, has upcoming appt reportedly

## 2014-04-10 NOTE — Assessment & Plan Note (Signed)
Vaccine administered at visit.  

## 2014-04-10 NOTE — Assessment & Plan Note (Signed)
Uncontrolled. Hyperlipidemia:Low fat diet discussed and encouraged.  Updated lab needed at/ before next visit.  

## 2014-04-10 NOTE — Assessment & Plan Note (Signed)
Controlled and managed by neurology 

## 2014-04-10 NOTE — Assessment & Plan Note (Signed)
Controlled, no change in medication DASH diet and commitment to daily physical activity for a minimum of 30 minutes discussed and encouraged, as a part of hypertension management. The importance of attaining a healthy weight is also discussed.  

## 2014-04-10 NOTE — Assessment & Plan Note (Signed)
Unchanged. Patient re-educated about  the importance of commitment to a  minimum of 150 minutes of exercise per week. The importance of healthy food choices with portion control discussed. Encouraged to start a food diary, count calories and to consider  joining a support group. Sample diet sheets offered. Goals set by the patient for the next several months.    

## 2014-06-22 ENCOUNTER — Other Ambulatory Visit: Payer: Self-pay | Admitting: Family Medicine

## 2014-07-21 ENCOUNTER — Other Ambulatory Visit: Payer: Self-pay | Admitting: Family Medicine

## 2014-08-15 ENCOUNTER — Ambulatory Visit (HOSPITAL_COMMUNITY)
Admission: RE | Admit: 2014-08-15 | Discharge: 2014-08-15 | Disposition: A | Discharge status: Home / Self Care | Payer: Medicaid Other | Source: Ambulatory Visit | Attending: Family Medicine | Admitting: Family Medicine

## 2014-08-15 ENCOUNTER — Other Ambulatory Visit: Payer: Self-pay

## 2014-08-15 ENCOUNTER — Ambulatory Visit (INDEPENDENT_AMBULATORY_CARE_PROVIDER_SITE_OTHER): Payer: Medicaid Other | Admitting: Family Medicine

## 2014-08-15 ENCOUNTER — Encounter: Payer: Self-pay | Admitting: Family Medicine

## 2014-08-15 VITALS — BP 134/84 | HR 96 | Resp 16 | Ht 65.0 in | Wt 231.0 lb

## 2014-08-15 DIAGNOSIS — M1711 Unilateral primary osteoarthritis, right knee: Secondary | ICD-10-CM | POA: Insufficient documentation

## 2014-08-15 DIAGNOSIS — R569 Unspecified convulsions: Secondary | ICD-10-CM

## 2014-08-15 DIAGNOSIS — IMO0001 Reserved for inherently not codable concepts without codable children: Secondary | ICD-10-CM

## 2014-08-15 DIAGNOSIS — E785 Hyperlipidemia, unspecified: Secondary | ICD-10-CM

## 2014-08-15 DIAGNOSIS — F418 Other specified anxiety disorders: Secondary | ICD-10-CM

## 2014-08-15 DIAGNOSIS — I10 Essential (primary) hypertension: Secondary | ICD-10-CM

## 2014-08-15 DIAGNOSIS — F419 Anxiety disorder, unspecified: Secondary | ICD-10-CM

## 2014-08-15 DIAGNOSIS — F329 Major depressive disorder, single episode, unspecified: Secondary | ICD-10-CM

## 2014-08-15 DIAGNOSIS — M179 Osteoarthritis of knee, unspecified: Secondary | ICD-10-CM

## 2014-08-15 DIAGNOSIS — E890 Postprocedural hypothyroidism: Secondary | ICD-10-CM

## 2014-08-15 DIAGNOSIS — F32A Depression, unspecified: Secondary | ICD-10-CM

## 2014-08-15 LAB — COMPLETE METABOLIC PANEL WITH GFR
ALT: 12 U/L (ref 0–35)
AST: 17 U/L (ref 0–37)
Albumin: 4.2 g/dL (ref 3.5–5.2)
Alkaline Phosphatase: 127 U/L — ABNORMAL HIGH (ref 39–117)
BUN: 28 mg/dL — ABNORMAL HIGH (ref 6–23)
CO2: 27 mEq/L (ref 19–32)
Calcium: 9.6 mg/dL (ref 8.4–10.5)
Chloride: 103 mEq/L (ref 96–112)
Creat: 1.46 mg/dL — ABNORMAL HIGH (ref 0.50–1.10)
GFR, Est African American: 45 mL/min — ABNORMAL LOW
GFR, Est Non African American: 39 mL/min — ABNORMAL LOW
Glucose, Bld: 80 mg/dL (ref 70–99)
Potassium: 4.2 mEq/L (ref 3.5–5.3)
Sodium: 142 mEq/L (ref 135–145)
Total Bilirubin: 0.6 mg/dL (ref 0.2–1.2)
Total Protein: 8.3 g/dL (ref 6.0–8.3)

## 2014-08-15 LAB — LIPID PANEL
Cholesterol: 189 mg/dL (ref 0–200)
HDL: 46 mg/dL (ref >=46)
LDL Cholesterol: 126 mg/dL — ABNORMAL HIGH (ref 0–99)
Total CHOL/HDL Ratio: 4.1 Ratio
Triglycerides: 83 mg/dL (ref <150)
VLDL: 17 mg/dL (ref 0–40)

## 2014-08-15 MED ORDER — FLUOXETINE HCL 10 MG PO CAPS: ORAL_CAPSULE | ORAL | Status: DC

## 2014-08-15 MED ORDER — HYDROCHLOROTHIAZIDE 25 MG PO TABS
25.0000 mg | ORAL_TABLET | Freq: Every day | ORAL | Status: DC
Start: 2014-08-15 — End: 2015-02-16

## 2014-08-15 MED ORDER — PREDNISONE 5 MG PO TABS: 5.0000 mg | ORAL_TABLET | Freq: Two times a day (BID) | ORAL | Status: AC

## 2014-08-15 MED ORDER — METHYLPREDNISOLONE ACETATE 80 MG/ML IJ SUSP
80.0000 mg | Freq: Once | INTRAMUSCULAR | Status: AC
  Administered 2014-08-15: 80 mg via INTRAMUSCULAR

## 2014-08-15 MED ORDER — LORATADINE 10 MG PO TABS: 10.0000 mg | ORAL_TABLET | Freq: Every day | ORAL | Status: DC

## 2014-08-15 MED ORDER — OMEPRAZOLE 20 MG PO CPDR: DELAYED_RELEASE_CAPSULE | ORAL | Status: DC

## 2014-08-15 MED ORDER — KETOROLAC TROMETHAMINE 60 MG/2ML IM SOLN
60.0000 mg | Freq: Once | INTRAMUSCULAR | Status: AC
  Administered 2014-08-15: 60 mg via INTRAMUSCULAR

## 2014-08-15 MED ORDER — ACETAMINOPHEN 500 MG PO TABS: 500.0000 mg | ORAL_TABLET | Freq: Two times a day (BID) | ORAL | Status: AC

## 2014-08-15 MED ORDER — SIMVASTATIN 80 MG PO TABS: ORAL_TABLET | ORAL | Status: DC

## 2014-08-15 MED ORDER — BENAZEPRIL HCL 20 MG PO TABS: ORAL_TABLET | ORAL | Status: DC

## 2014-08-15 NOTE — Patient Instructions (Addendum)
Annual physical exam in 4.5 month, call if you need me before  Injections, medication and xray of right knee today  TAKE TYLENOL 500 MG. 1 TABLET TWICE DAILY  NEED colonoscopy and SHINGLES vaccine nurse qwill co ordinate  Fasting labs needed  Please work on good  health habits so that your health will improve. 1. Commitment to daily physical activity for 30 to 60  minutes, if you are able to do this.  2. Commitment to wise food choices. Aim for half of your  food intake to be vegetable and fruit, one quarter starchy foods, and one quarter protein. Try to eat on a regular schedule  3 meals per day, snacking between meals should be limited to vegetables or fruits or small portions of nuts. 64 ounces of water per day is generally recommended, unless you have specific health conditions, like heart failure or kidney failure where you will need to limit fluid intake.  3. Commitment to sufficient and a  good quality of physical and mental rest daily, generally between 6 to 8 hours per day.  WITH PERSISTANCE AND PERSEVERANCE, THE IMPOSSIBLE , BECOMES THE NORM!  Thanks for choosing [facility], we consider it a privelige to serve you.

## 2014-08-16 ENCOUNTER — Telehealth: Payer: Self-pay | Admitting: Family Medicine

## 2014-08-17 NOTE — Telephone Encounter (Signed)
Will speak with md

## 2014-08-28 NOTE — Assessment & Plan Note (Signed)
Followed by endocrinology, controlled

## 2014-08-28 NOTE — Assessment & Plan Note (Signed)
Controlled, no change in medication  

## 2014-08-28 NOTE — Assessment & Plan Note (Signed)
Controlled and managed by neurology, no seizure activity in over 1 year

## 2014-08-28 NOTE — Assessment & Plan Note (Signed)
Deteriorated. Patient re-educated about  the importance of commitment to a  minimum of 150 minutes of exercise per week.  The importance of healthy food choices with portion control discussed. Encouraged to start a food diary, count calories and to consider  joining a support group. Sample diet sheets offered. Goals set by the patient for the next several months.   Weight /BMI 08/15/2014 04/03/2014 01/24/2014  WEIGHT 231 lb 227 lb 227 lb  HEIGHT 5\' 5"  5\' 5"  5\' 5"   BMI 38.44 kg/m2 37.77 kg/m2 37.77 kg/m2    Current exercise per week 60 minutes.Currently limited by knee instability and pain

## 2014-08-28 NOTE — Progress Notes (Signed)
[name]     MRN: [id number]      DOB: 1955/08/28   HPI [name] is here for follow up and re-evaluation of chronic medical conditions, medication management and review of any available recent lab and radiology data.  Preventive health is updated, specifically  Cancer screening and Immunization.   Questions or concerns regarding consultations or procedures which the PT has had in the interim are  addressed. The PT denies any adverse reactions to current medications since the last visit.  C/o increasing right  knee pain and stiffness, at times feels as though will buckle, no falls   ROS Denies recent fever or chills. Denies sinus pressure, nasal congestion, ear pain or sore throat. Denies chest congestion, productive cough or wheezing. Denies chest pains, palpitations and leg swelling Denies abdominal pain, nausea, vomiting,diarrhea or constipation.   Denies dysuria, frequency, hesitancy or incontinence.  Denies headaches, seizures, numbness, or tingling. Denies depression, anxiety or insomnia. Denies skin break down or rash.   PE  BP 134/84 mmHg  Pulse 96  Resp 16  Ht 5\' 5"  (1.651 m)  Wt 231 lb (104.781 kg)  BMI 38.44 kg/m2  SpO2 98%  Patient alert and oriented and in no cardiopulmonary distress.  HEENT: No facial asymmetry, EOMI,   oropharynx pink and moist.  Neck supple no JVD, no mass.  Chest: Clear to auscultation bilaterally.  CVS: S1, S2 no murmurs, no S3.Regular rate.  ABD: Soft non tender.   Ext: No edema  MS: Adequate ROM spine, shoulders, hips and reduced in right   knee.  Skin: Intact, no ulcerations or rash noted.  Psych: Good eye contact, normal affect. Memory intact not anxious or depressed appearing.  CNS: CN 2-12 intact, power,  normal throughout.no focal deficits noted.   Assessment & Plan   Osteoarthritis of right knee Acute pain and swelling  Uncontrolled  Uncontrolled.Toradol and depo medrol administered IM in the office , to  be followed by a short course of oral prednisone and NSAIDS.   Essential hypertension Controlled, no change in medication DASH diet and commitment to daily physical activity for a minimum of 30 minutes discussed and encouraged, as a part of hypertension management. The importance of attaining a healthy weight is also discussed.  BP/Weight 08/15/2014 04/03/2014 01/24/2014 12/06/2013 08/04/2013 07/12/2013 Q000111Q  Systolic BP Q000111Q 123456 [id number]  Diastolic BP 84 82 84 82 66 80 70  Wt. (Lbs) 231 227 227 225 220 220.12 233  BMI 38.44 37.77 37.77 36.86 36.04 36.06 38.17        Obesity, Class II, BMI 35.0-39.9, with comorbidity (see actual BMI) Deteriorated. Patient re-educated about  the importance of commitment to a  minimum of 150 minutes of exercise per week.  The importance of healthy food choices with portion control discussed. Encouraged to start a food diary, count calories and to consider  joining a support group. Sample diet sheets offered. Goals set by the patient for the next several months.   Weight /BMI 08/15/2014 04/03/2014 01/24/2014  WEIGHT 231 lb 227 lb 227 lb  HEIGHT 5\' 5"  5\' 5"  5\' 5"   BMI 38.44 kg/m2 37.77 kg/m2 37.77 kg/m2    Current exercise per week 60 minutes.Currently limited by knee instability and pain   Hyperlipidemia LDL goal <100 Hyperlipidemia:Low fat diet discussed and encouraged.   Lipid Panel  Lab Results  Component Value Date   CHOL 189 08/15/2014   HDL 46 08/15/2014   LDLCALC 126* 08/15/2014  TRIG 83 08/15/2014   CHOLHDL 4.1 08/15/2014   Uncontrolled  Updated lab needed at/ before next visit.      Anxiety and depression Controlled, no change in medication   Convulsions Controlled and managed by neurology, no seizure activity in over 1 year  Hypothyroidism, postsurgical Followed by endocrinology, controlled

## 2014-08-28 NOTE — Assessment & Plan Note (Signed)
Hyperlipidemia:Low fat diet discussed and encouraged.   Lipid Panel  Lab Results  Component Value Date   CHOL 189 08/15/2014   HDL 46 08/15/2014   LDLCALC 126* 08/15/2014   TRIG 83 08/15/2014   CHOLHDL 4.1 08/15/2014   Uncontrolled  Updated lab needed at/ before next visit.

## 2014-08-28 NOTE — Assessment & Plan Note (Signed)
Acute pain and swelling  Uncontrolled  Uncontrolled.Toradol and depo medrol administered IM in the office , to be followed by a short course of oral prednisone and NSAIDS.

## 2014-08-28 NOTE — Assessment & Plan Note (Signed)
Controlled, no change in medication DASH diet and commitment to daily physical activity for a minimum of 30 minutes discussed and encouraged, as a part of hypertension management. The importance of attaining a healthy weight is also discussed.  BP/Weight 08/15/2014 04/03/2014 01/24/2014 12/06/2013 08/04/2013 07/12/2013 Q000111Q  Systolic BP Q000111Q 123456 0000000 [id number]  Diastolic BP 84 82 84 82 66 80 70  Wt. (Lbs) 231 227 227 225 220 220.12 233  BMI 38.44 37.77 37.77 36.86 36.04 36.06 38.17

## 2014-08-30 ENCOUNTER — Telehealth: Payer: Self-pay | Admitting: Family Medicine

## 2014-08-30 NOTE — Telephone Encounter (Signed)
Appointment 7.13.16 at 2:00 to be seen here patient is aware

## 2014-08-30 NOTE — Telephone Encounter (Signed)
Will be addressed at upcoming appointment

## 2014-08-30 NOTE — Telephone Encounter (Signed)
Tried calling 4 times the number will not go through with 336 or without the 336 states the same message

## 2014-08-30 NOTE — Telephone Encounter (Signed)
Went to see patient yesterday and patient reported a fall from last week and she had a small seizure and fell and looks bruised and felt hard may have a hematoma she benefit to see Dr Moshe Cipro and maybe get a Ultra sound stated [name] please call [name] back

## 2014-08-30 NOTE — Telephone Encounter (Signed)
appt can be scheduled for next week when there are openings

## 2014-08-30 NOTE — Telephone Encounter (Signed)
Home health nurse (lydia) aware.

## 2014-09-06 ENCOUNTER — Ambulatory Visit (INDEPENDENT_AMBULATORY_CARE_PROVIDER_SITE_OTHER): Payer: Medicaid Other | Admitting: Family Medicine

## 2014-09-06 ENCOUNTER — Encounter: Payer: Self-pay | Admitting: Family Medicine

## 2014-09-06 VITALS — BP 120/80 | HR 75 | Resp 16 | Ht 65.0 in | Wt 231.0 lb

## 2014-09-06 DIAGNOSIS — F418 Other specified anxiety disorders: Secondary | ICD-10-CM | POA: Diagnosis not present

## 2014-09-06 DIAGNOSIS — I10 Essential (primary) hypertension: Secondary | ICD-10-CM

## 2014-09-06 DIAGNOSIS — F329 Major depressive disorder, single episode, unspecified: Secondary | ICD-10-CM

## 2014-09-06 DIAGNOSIS — F32A Depression, unspecified: Secondary | ICD-10-CM

## 2014-09-06 DIAGNOSIS — R569 Unspecified convulsions: Secondary | ICD-10-CM

## 2014-09-06 DIAGNOSIS — F419 Anxiety disorder, unspecified: Secondary | ICD-10-CM

## 2014-09-06 DIAGNOSIS — T148 Other injury of unspecified body region: Secondary | ICD-10-CM | POA: Diagnosis not present

## 2014-09-06 DIAGNOSIS — T148XXA Other injury of unspecified body region, initial encounter: Secondary | ICD-10-CM

## 2014-09-06 NOTE — Assessment & Plan Note (Signed)
Fell 2 weeks ago, had bruise on right lower abdomen which has decreased in size, pt reassured no imaging needed

## 2014-09-06 NOTE — Patient Instructions (Signed)
F/U as  before, call if you need me sooner  Nodule in right flank is from bruise where you fell, will completely go away within the next 6 to 8 weeks and is not dangerous  You need to see Dr Merlene Laughter since you ha d breakthrough seizure and a referral ha been sent in  Thanks for choosing [facility], we consider it a privelige to serve you.

## 2014-09-06 NOTE — Assessment & Plan Note (Addendum)
Uncontrolled, had recent seizure needs neurology re veal, she is already established with a Provider and I have advised her to call for appt

## 2014-09-17 NOTE — Assessment & Plan Note (Signed)
Controlled, no change in medication  

## 2014-09-17 NOTE — Progress Notes (Signed)
   [name]     MRN: [id number]      DOB: 1956/01/06   HPI [name] is here for follow up and re-evaluation of chronic medical conditions, medication management and review of any available recent lab and radiology data.  Preventive health is updated, specifically  Cancer screening and Immunization.   Questions or concerns regarding consultations or procedures which the PT has had in the interim are  addressed. The PT denies any adverse reactions to current medications since the last visit.  [place]  in the past week, HHN called with concerns re swelling on lower abdominal wall where she had fallen and she is here primarily for this reason , denies any visible bleeding from area.Fall occurred when she reports having had a witnessed seizure  ROS Denies recent fever or chills. Denies sinus pressure, nasal congestion, ear pain or sore throat. Denies chest congestion, productive cough or wheezing. Denies chest pains, palpitations and leg swelling Denies abdominal pain, nausea, vomiting,diarrhea or constipation.   Denies dysuria, frequency, hesitancy or incontinence. Denies joint pain, swelling and limitation in mobility. Denies headaches, , numbness, or tingling. Denies  Uncontrolled depression, anxiety or insomnia.    PE  BP 120/80 mmHg  Pulse 75  Resp 16  Ht 5\' 5"  (1.651 m)  Wt 231 lb (104.781 kg)  BMI 38.44 kg/m2  SpO2 99%  Patient alert and oriented and in no cardiopulmonary distress.  HEENT: No facial asymmetry, EOMI,   oropharynx pink and moist.  Neck supple no JVD, no mass.  Chest: Clear to auscultation bilaterally.  CVS: S1, S2 no murmurs, no S3.Regular rate.  ABD: Soft non tender. Bruise in RLQ in area of recent trauma, subcutaneous nodule,  On tender  Ext: No edema  MS: Adequate ROM spine, shoulders, hips and knees.  Skin: Intact, no ulcerations or rash noted.Hematoma which is  resolving present in RLQ  Psych: Good eye contact, normal affect.  not anxious or  depressed appearing.  CNS: CN 2-12 intact, power,  normal throughout.no focal deficits noted.   Assessment & Plan   Hematoma Fell 2 weeks ago, had bruise on right lower abdomen which has decreased in size, pt reassured no imaging needed  Convulsions Uncontrolled, had recent seizure needs neurology re veal, she is already established with a Provider and I have advised her to call for appt  Anxiety and depression Controlled, no change in medication   Essential hypertension Controlled, no change in medication

## 2014-11-22 ENCOUNTER — Other Ambulatory Visit: Payer: Self-pay

## 2014-11-22 DIAGNOSIS — C73 Malignant neoplasm of thyroid gland: Secondary | ICD-10-CM

## 2014-11-29 ENCOUNTER — Encounter: Payer: Self-pay | Admitting: "Endocrinology

## 2014-11-29 ENCOUNTER — Ambulatory Visit (INDEPENDENT_AMBULATORY_CARE_PROVIDER_SITE_OTHER): Payer: Medicaid Other | Admitting: "Endocrinology

## 2014-11-29 VITALS — BP 118/81 | HR 76 | Ht 66.5 in | Wt 235.0 lb

## 2014-11-29 DIAGNOSIS — E890 Postprocedural hypothyroidism: Secondary | ICD-10-CM | POA: Diagnosis not present

## 2014-11-29 DIAGNOSIS — C73 Malignant neoplasm of thyroid gland: Secondary | ICD-10-CM | POA: Diagnosis not present

## 2014-11-29 DIAGNOSIS — IMO0001 Reserved for inherently not codable concepts without codable children: Secondary | ICD-10-CM

## 2014-11-29 DIAGNOSIS — I10 Essential (primary) hypertension: Secondary | ICD-10-CM

## 2014-11-29 MED ORDER — LEVOTHYROXINE SODIUM 125 MCG PO TABS: 125.0000 ug | ORAL_TABLET | Freq: Every day | ORAL | Status: DC

## 2014-11-29 NOTE — Progress Notes (Signed)
Subjective:    Patient ID: [name], female    DOB: March 24, 1955,    Past Medical History  Diagnosis Date  . Obesity   . Depression   . Mental retardation, mild (I.Q. 50-70)   . Hyperlipidemia   . Seizure disorder ([place])   . Hypertension   . Thyroid cancer ([place])   . Seizure ([place])     11/17/2012 "still having them; "I believe their from a tumor in my head" ((11/17/2012   Past Surgical History  Procedure Laterality Date  . Breast lumpectomy  1996    right   . Total thyroidectomy Bilateral 11/17/2012  . Abdominal hysterectomy  2001  . Tubal ligation    . Thyroidectomy Bilateral 11/17/2012    Procedure: TOTAL THYROIDECTOMY;  Surgeon: Ascencion Dike, MD;  Location: [facility] OR;  Service: ENT;  Laterality: Bilateral;   Social History   Social History  . Marital Status: Single    Spouse Name: N/A  . Number of Children: 2  . Years of Education: N/A   Occupational History  . disabled     Social History Main Topics  . Smoking status: Never Smoker   . Smokeless tobacco: Never Used  . Alcohol Use: No  . Drug Use: No  . Sexual Activity: No   Other Topics Concern  . None   Social History Narrative   Outpatient Encounter Prescriptions as of 11/29/2014  Medication Sig  . acetaminophen (TYLENOL) 500 MG tablet Take 1 tablet (500 mg total) by mouth 2 (two) times daily.  . benazepril (LOTENSIN) 20 MG tablet TAKE (1) TABLET BY MOUTH DAILY.  . calcium-vitamin D (OSCAL WITH D) 500-200 MG-UNIT per tablet Take 1 tablet by mouth 2 (two) times daily.  . ergocalciferol (VITAMIN D2) 50000 UNITS capsule Take 50,000 Units by mouth once a week.  Marland Kitchen FLUoxetine (PROZAC) 10 MG capsule TAKE (1) CAPSULE BY MOUTH ONCE DAILY.  . hydrochlorothiazide (HYDRODIURIL) 25 MG tablet Take 1 tablet (25 mg total) by mouth daily.  Marland Kitchen lamoTRIgine (LAMICTAL) 100 MG tablet Take 100 mg by mouth daily.  Marland Kitchen levETIRAcetam (KEPPRA) 750 MG tablet Take 1,500 mg by mouth 2 (two) times daily.   Marland Kitchen levothyroxine (SYNTHROID,  LEVOTHROID) 125 MCG tablet Take 1 tablet (125 mcg total) by mouth daily before breakfast.  . loratadine (CLARITIN) 10 MG tablet Take 1 tablet (10 mg total) by mouth daily.  Marland Kitchen omeprazole (PRILOSEC) 20 MG capsule TAKE 2 CAPSULES BY MOUTH ONCE DAILY.  . simvastatin (ZOCOR) 80 MG tablet TAKE (1) TABLET BY MOUTH ONCE DAILY.  . [DISCONTINUED] levothyroxine (SYNTHROID, LEVOTHROID) 75 MCG tablet Take 1 tablet (75 mcg total) by mouth daily.   No facility-administered encounter medications on file as of 11/29/2014.   ALLERGIES: No Known Allergies VACCINATION STATUS: Immunization History  Administered Date(s) Administered  . H1N1 12/16/2007  . Influenza Split 11/24/2011  . Influenza Whole 11/30/2006, 12/07/2008, 12/26/2009  . Influenza,inj,Quad PF,36+ Mos 11/18/2012, 12/06/2013  . Pneumococcal Conjugate-13 04/03/2014  . Td 08/22/2003    HPI  59 yr old female with medical hx as above . she is here to f/u for post surgical hypothyroidism and for f/u of  thyroid cancer. She is s/p Thyrogen stimulated remnant ablation with negative post therapy WBS. 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 She is now on Synthroid 125 g by mouth every morning.  -She is compliant denies any new complaints.   she denies  palpitations. she denies dysphagia, SOB, nor voice change. she denies family hx of thyroid cancer. Review of Systems  Constitutional: Positive for fatigue. Negative for unexpected weight change.  HENT: Negative for trouble swallowing and voice change.   Eyes: Negative for visual disturbance.  Respiratory: Negative for cough, shortness of breath and wheezing.   Cardiovascular: Negative for chest pain, palpitations and leg swelling.  Gastrointestinal: Negative for nausea, vomiting and diarrhea.  Endocrine: Negative for cold intolerance, heat intolerance, polydipsia, polyphagia and polyuria.  Musculoskeletal:  Negative for myalgias and arthralgias.  Skin: Negative for color change, pallor, rash and wound.  Neurological: Negative for seizures and headaches.  Psychiatric/Behavioral: Negative for suicidal ideas and confusion.    Objective:    BP 118/81 mmHg  Pulse 76  Ht 5' 6.5" (1.689 m)  Wt 235 lb (106.595 kg)  BMI 37.37 kg/m2  SpO2 99%  Wt Readings from Last 3 Encounters:  11/29/14 235 lb (106.595 kg)  09/06/14 231 lb (104.781 kg)  08/15/14 231 lb (104.781 kg)    Physical Exam  Constitutional: She is oriented to person, place, and time. She appears well-developed.  HENT:  Head: Normocephalic and atraumatic.  Eyes: EOM are normal.  Neck: Normal range of motion. Neck supple. No tracheal deviation present.  Post thyroidectomy scar, no gross mass lesion on PE.  Cardiovascular: Normal rate and regular rhythm.   Pulmonary/Chest: Effort normal and breath sounds normal.  Abdominal: Soft. Bowel sounds are normal. There is no tenderness. There is no guarding.  Musculoskeletal: Normal range of motion. She exhibits no edema.  Neurological: She is alert and oriented to person, place, and time. She has normal reflexes. No cranial nerve deficit. Coordination normal.  Skin: Skin is warm and dry. No rash noted. No erythema. No pallor.  Psychiatric: She has a normal mood and affect. Judgment normal.    Results for orders placed or performed in visit on 08/15/14  Lipid panel  Result Value Ref Range   Cholesterol 189 0 - 200 mg/dL   Triglycerides 83 <150 mg/dL   HDL 46 >=46 mg/dL   Total CHOL/HDL Ratio 4.1 Ratio   VLDL 17 0 - 40 mg/dL   LDL Cholesterol 126 (H) 0 - 99 mg/dL  COMPLETE METABOLIC PANEL WITH GFR  Result Value Ref Range   Sodium 142 135 - 145 mEq/L   Potassium 4.2 3.5 - 5.3 mEq/L   Chloride 103 96 - 112 mEq/L   CO2 27 19 - 32 mEq/L   Glucose, Bld 80 70 - 99 mg/dL   BUN 28 (H) 6 - 23 mg/dL   Creat 1.46 (H) 0.50 - 1.10 mg/dL   Total Bilirubin 0.6 0.2 - 1.2 mg/dL   Alkaline  Phosphatase 127 (H) 39 - 117 U/L   AST 17 0 - 37 U/L   ALT 12 0 - 35 U/L   Total Protein 8.3 6.0 - 8.3 g/dL   Albumin 4.2 3.5 - 5.2 g/dL   Calcium 9.6 8.4 - 10.5 mg/dL   GFR, Est African American 45 (L) mL/min   GFR, Est Non African American 39 (L) mL/min   Complete Blood Count (Most recent): Lab Results  Component Value Date   WBC 3.6* 04/03/2014   HGB 12.8 04/03/2014   HCT 39.3 04/03/2014   MCV 88.5 04/03/2014   PLT 218 04/03/2014   Chemistry (most recent): Lab Results  Component Value Date   NA 142 08/15/2014   K 4.2 08/15/2014   CL 103 08/15/2014   CO2 27 08/15/2014  BUN 28* 08/15/2014   CREATININE 1.46* 08/15/2014   Diabetic Labs (most recent): Lab Results  Component Value Date   HGBA1C 5.3 08/10/2012   Lipid profile (most recent): Lab Results  Component Value Date   TRIG 83 08/15/2014   CHOL 189 08/15/2014         Assessment & Plan:   1. Hypothyroidism, postsurgical Her labs are c/w  appropriate replacement with thyroid hormone.  I will continue Synthroid 125 mcg po qam. She will need at least 125 mcg of LT4 for clinical replacement and suppress potential tumor recurrence. - TSH - T4, free  2. Papillary thyroid carcinoma ([place]) Her  surveillance neck /thyroid u/s from 11/17/2013 was c/w surgically absent thyroid. Pt is s/p thyrogen stimulated thyroid remnant ablation with WBS on 01/14/2013 with no evidence of iodine avid metastasis. She has had pT2,Nx,Mx FVPTC, s/p near total thyroidectomy on September 24,2014. She is counseled to maintain high degree of compliance with synthroid therapy with aim of keeping TSH low normal ( 0.1-0.5).  -she is advised on the necessity of follow-ups and imaging studies at least one annually for the next 5 years. - She will need surveillance [place] of Soft Tissue Head/Neck; before her next visit.   3. Essential hypertension Controlled patient is advised to continue Suezanne Jacquet has approved 20 mg by mouth daily and  hydrochlorothiazide 25 mg by mouth daily.  4. Obesity, Class II, BMI 35.0-39.9, with comorbidity (see actual BMI) ([place]) Patient is provided with weight loss advice.  Follow up plan: Return in about 3 months (around 03/01/2015) for underactive thyroid, thyroid cancer, , high blood pressure.  Glade Lloyd, MD Phone: [phone]  Fax: [phone]   11/29/2014, 1:37 PM

## 2014-11-29 NOTE — Patient Instructions (Signed)

## 2014-11-30 ENCOUNTER — Ambulatory Visit (HOSPITAL_COMMUNITY): Admission: RE | Admit: 2014-11-30 | Payer: Medicaid Other | Source: Ambulatory Visit

## 2015-01-30 ENCOUNTER — Encounter: Payer: Self-pay | Admitting: Family Medicine

## 2015-01-30 ENCOUNTER — Encounter: Payer: Medicaid Other | Admitting: Family Medicine

## 2015-02-16 ENCOUNTER — Other Ambulatory Visit: Payer: Self-pay | Admitting: Family Medicine

## 2015-02-28 ENCOUNTER — Ambulatory Visit (HOSPITAL_COMMUNITY): Admission: RE | Admit: 2015-02-28 | Payer: Medicaid Other | Source: Ambulatory Visit

## 2015-03-06 ENCOUNTER — Ambulatory Visit: Payer: Medicaid Other | Admitting: "Endocrinology

## 2015-03-22 ENCOUNTER — Other Ambulatory Visit: Payer: Self-pay | Admitting: "Endocrinology

## 2015-03-22 ENCOUNTER — Other Ambulatory Visit: Payer: Self-pay | Admitting: Family Medicine

## 2015-04-06 DIAGNOSIS — F78 Other intellectual disabilities: Secondary | ICD-10-CM | POA: Diagnosis not present

## 2015-04-06 DIAGNOSIS — R569 Unspecified convulsions: Secondary | ICD-10-CM | POA: Diagnosis not present

## 2015-04-23 ENCOUNTER — Other Ambulatory Visit: Payer: Self-pay | Admitting: Family Medicine

## 2015-04-25 DIAGNOSIS — I10 Essential (primary) hypertension: Secondary | ICD-10-CM

## 2015-04-25 DIAGNOSIS — R569 Unspecified convulsions: Secondary | ICD-10-CM

## 2015-04-25 DIAGNOSIS — F78 Other intellectual disabilities: Secondary | ICD-10-CM

## 2015-05-02 ENCOUNTER — Encounter: Payer: Medicaid Other | Admitting: Family Medicine

## 2015-05-21 ENCOUNTER — Other Ambulatory Visit: Payer: Self-pay | Admitting: Family Medicine

## 2015-05-23 ENCOUNTER — Encounter: Payer: Self-pay | Admitting: Family Medicine

## 2015-05-23 ENCOUNTER — Other Ambulatory Visit: Payer: Self-pay | Admitting: Family Medicine

## 2015-05-23 ENCOUNTER — Ambulatory Visit (INDEPENDENT_AMBULATORY_CARE_PROVIDER_SITE_OTHER): Payer: Medicaid Other | Admitting: Family Medicine

## 2015-05-23 VITALS — BP 156/72 | HR 86 | Resp 18 | Ht 65.0 in | Wt 239.0 lb

## 2015-05-23 DIAGNOSIS — I10 Essential (primary) hypertension: Secondary | ICD-10-CM | POA: Diagnosis not present

## 2015-05-23 DIAGNOSIS — Z1211 Encounter for screening for malignant neoplasm of colon: Secondary | ICD-10-CM

## 2015-05-23 DIAGNOSIS — E559 Vitamin D deficiency, unspecified: Secondary | ICD-10-CM

## 2015-05-23 DIAGNOSIS — Z23 Encounter for immunization: Secondary | ICD-10-CM | POA: Diagnosis not present

## 2015-05-23 DIAGNOSIS — F32A Depression, unspecified: Secondary | ICD-10-CM

## 2015-05-23 DIAGNOSIS — IMO0001 Reserved for inherently not codable concepts without codable children: Secondary | ICD-10-CM

## 2015-05-23 DIAGNOSIS — Z1159 Encounter for screening for other viral diseases: Secondary | ICD-10-CM

## 2015-05-23 DIAGNOSIS — Z1231 Encounter for screening mammogram for malignant neoplasm of breast: Secondary | ICD-10-CM | POA: Diagnosis not present

## 2015-05-23 DIAGNOSIS — Z114 Encounter for screening for human immunodeficiency virus [HIV]: Secondary | ICD-10-CM

## 2015-05-23 DIAGNOSIS — R569 Unspecified convulsions: Secondary | ICD-10-CM

## 2015-05-23 DIAGNOSIS — F419 Anxiety disorder, unspecified: Secondary | ICD-10-CM

## 2015-05-23 DIAGNOSIS — F418 Other specified anxiety disorders: Secondary | ICD-10-CM

## 2015-05-23 DIAGNOSIS — E890 Postprocedural hypothyroidism: Secondary | ICD-10-CM

## 2015-05-23 DIAGNOSIS — F329 Major depressive disorder, single episode, unspecified: Secondary | ICD-10-CM

## 2015-05-23 DIAGNOSIS — E785 Hyperlipidemia, unspecified: Secondary | ICD-10-CM

## 2015-05-23 MED ORDER — BENAZEPRIL HCL 40 MG PO TABS: 40.0000 mg | ORAL_TABLET | Freq: Every day | ORAL | Status: DC

## 2015-05-23 NOTE — Patient Instructions (Addendum)
F/u in 3 month, call if you need me before  Flu vaccine today'  , new is benazepril 40 mg one daily, since BP is high No  MORE simvastatin due to muscle cramp  Lab today  Please get mammogram and colonoscopy both are past due , we will give appointments ,  Need to follow thriough

## 2015-05-24 LAB — LIPID PANEL
Cholesterol: 253 mg/dL — ABNORMAL HIGH (ref 125–200)
HDL: 43 mg/dL — ABNORMAL LOW (ref >=46)
LDL Cholesterol: 185 mg/dL — ABNORMAL HIGH (ref <130)
Total CHOL/HDL Ratio: 5.9 Ratio — ABNORMAL HIGH (ref <=5.0)
Triglycerides: 124 mg/dL (ref <150)
VLDL: 25 mg/dL (ref <30)

## 2015-05-24 LAB — HEPATITIS C ANTIBODY: HCV Ab: NEGATIVE

## 2015-05-24 LAB — COMPLETE METABOLIC PANEL WITH GFR
ALT: 9 U/L (ref 6–29)
AST: 17 U/L (ref 10–35)
Albumin: 4 g/dL (ref 3.6–5.1)
Alkaline Phosphatase: 108 U/L (ref 33–130)
BUN: 26 mg/dL — ABNORMAL HIGH (ref 7–25)
CO2: 26 mmol/L (ref 20–31)
Calcium: 9.4 mg/dL (ref 8.6–10.4)
Chloride: 101 mmol/L (ref 98–110)
Creat: 1.5 mg/dL — ABNORMAL HIGH (ref 0.50–0.99)
GFR, Est African American: 43 mL/min — ABNORMAL LOW (ref >=60)
GFR, Est Non African American: 37 mL/min — ABNORMAL LOW (ref >=60)
Glucose, Bld: 89 mg/dL (ref 65–99)
Potassium: 4 mmol/L (ref 3.5–5.3)
Sodium: 141 mmol/L (ref 135–146)
Total Bilirubin: 0.5 mg/dL (ref 0.2–1.2)
Total Protein: 7.8 g/dL (ref 6.1–8.1)

## 2015-05-24 LAB — CBC
HCT: 37 % (ref 36.0–46.0)
Hemoglobin: 12 g/dL (ref 12.0–15.0)
MCH: 28.4 pg (ref 26.0–34.0)
MCHC: 32.4 g/dL (ref 30.0–36.0)
MCV: 87.7 fL (ref 78.0–100.0)
MPV: 10.3 fL (ref 8.6–12.4)
Platelets: 233 10*3/uL (ref 150–400)
RBC: 4.22 MIL/uL (ref 3.87–5.11)
RDW: 14.3 % (ref 11.5–15.5)
WBC: 4.4 10*3/uL (ref 4.0–10.5)

## 2015-05-24 LAB — HIV ANTIBODY (ROUTINE TESTING W REFLEX): HIV 1&2 Ab, 4th Generation: NONREACTIVE

## 2015-05-24 LAB — TSH: TSH: 0.22 mIU/L — ABNORMAL LOW

## 2015-05-24 LAB — VITAMIN D 25 HYDROXY (VIT D DEFICIENCY, FRACTURES): Vit D, 25-Hydroxy: 57 ng/mL (ref 30–100)

## 2015-05-27 NOTE — Progress Notes (Signed)
Subjective:    Patient ID: [name], female    DOB: 1955/11/26, 60 y.o.   MRN: [id number]  HPI   [name]     MRN: [id number]      DOB: March 31, 1955   HPI Ms. Mise is here for follow up and re-evaluation of chronic medical conditions, medication management and review of any available recent lab and radiology data.  Preventive health is updated, specifically  Cancer screening and Immunization.   Cancer screening is past due, and also her flu vaccine, needs to follow through Stopped simvastatin , states "could not tolerate" will see what current lipid panel is No regular exercise , or attempt to change diet and lose weight   ROS Denies recent fever or chills. Denies sinus pressure, nasal congestion, ear pain or sore throat. Denies chest congestion, productive cough or wheezing. Denies chest pains, palpitations and leg swelling Denies abdominal pain, nausea, vomiting,diarrhea or constipation.   Denies dysuria, frequency, hesitancy or incontinence. Denies joint pain, swelling and limitation in mobility. Denies headaches, seizures, numbness, or tingling. Denies depression, anxiety or insomnia. Denies skin break down or rash.   PE  BP 156/72 mmHg  Pulse 86  Resp 18  Ht 5\' 5"  (1.651 m)  Wt 239 lb (108.41 kg)  BMI 39.77 kg/m2  SpO2 98%  Patient alert and oriented and in no cardiopulmonary distress.  HEENT: No facial asymmetry, EOMI,   oropharynx pink and moist.  Neck supple no JVD, no mass.  Chest: Clear to auscultation bilaterally.  CVS: S1, S2 no murmurs, no S3.Regular rate.  ABD: Soft non tender.   Ext: No edema  MS: Adequate ROM spine, shoulders, hips and knees.  Skin: Intact, no ulcerations or rash noted.  Psych: Good eye contact, normal affect. Memory intact not anxious or depressed appearing.  CNS: CN 2-12 intact, power,  normal throughout.no focal deficits noted.   Assessment & Plan   Essential hypertension Uncontrolled, increase med  dose DASH diet and commitment to daily physical activity for a minimum of 30 minutes discussed and encouraged, as a part of hypertension management. The importance of attaining a healthy weight is also discussed.  BP/Weight 05/23/2015 11/29/2014 09/06/2014 08/15/2014 04/03/2014 01/24/2014 [id number]  Systolic BP A999333 [id number]  Diastolic BP 72 81 80 84 82 84 82  Wt. (Lbs) 239 235 231 231 227 227 225  BMI 39.77 37.37 38.44 38.44 37.77 37.77 36.86        Anxiety and depression Controlled, no change in medication   Hyperlipidemia LDL goal <100 Deteriorated and uncontrolled, reportsd muscle cramp with simvastatin at last dose , will mneed to re address med management, will need to contact pt since updated lab not available at visit  Obesity, Class II, BMI 35.0-39.9, with comorbidity (see actual BMI) Deteriorated. Patient re-educated about  the importance of commitment to a  minimum of 150 minutes of exercise per week.  The importance of healthy food choices with portion control discussed. Encouraged to start a food diary, count calories and to consider  joining a support group. Sample diet sheets offered. Goals set by the patient for the next several months.   Weight /BMI 05/23/2015 11/29/2014 09/06/2014  WEIGHT 239 lb 235 lb 231 lb  HEIGHT 5\' 5"  5' 6.5" 5\' 5"   BMI 39.77 kg/m2 37.37 kg/m2 38.44 kg/m2    Current exercise per week 30 minutes.   Convulsions Controlled, no change in medication Managed by neurology  Hypothyroidism, postsurgical Low TSH despite  the fact that pt reproted att visit she had not been taking her med. Needs to return to endo, has been non compliant with routine care which needs to change        Review of Systems     Objective:   Physical Exam        Assessment & Plan:

## 2015-05-27 NOTE — Assessment & Plan Note (Signed)
Controlled, no change in medication  

## 2015-05-27 NOTE — Assessment & Plan Note (Signed)
Deteriorated and uncontrolled, reportsd muscle cramp with simvastatin at last dose , will mneed to re address med management, will need to contact pt since updated lab not available at visit

## 2015-05-27 NOTE — Assessment & Plan Note (Signed)
Low TSH despite the fact that pt reproted att visit she had not been taking her med. Needs to return to endo, has been non compliant with routine care which needs to change

## 2015-05-27 NOTE — Assessment & Plan Note (Signed)
Deteriorated. Patient re-educated about  the importance of commitment to a  minimum of 150 minutes of exercise per week.  The importance of healthy food choices with portion control discussed. Encouraged to start a food diary, count calories and to consider  joining a support group. Sample diet sheets offered. Goals set by the patient for the next several months.   Weight /BMI 05/23/2015 11/29/2014 09/06/2014  WEIGHT 239 lb 235 lb 231 lb  HEIGHT 5\' 5"  5' 6.5" 5\' 5"   BMI 39.77 kg/m2 37.37 kg/m2 38.44 kg/m2    Current exercise per week 30 minutes.

## 2015-05-27 NOTE — Assessment & Plan Note (Signed)
Uncontrolled, increase med dose DASH diet and commitment to daily physical activity for a minimum of 30 minutes discussed and encouraged, as a part of hypertension management. The importance of attaining a healthy weight is also discussed.  BP/Weight 05/23/2015 11/29/2014 09/06/2014 08/15/2014 04/03/2014 01/24/2014 A999333  Systolic BP A999333 [id number] [id number] 123456 [id number]  Diastolic BP 72 81 80 84 82 84 82  Wt. (Lbs) 239 235 231 231 227 227 225  BMI 39.77 37.37 38.44 38.44 37.77 37.77 36.86

## 2015-05-27 NOTE — Assessment & Plan Note (Signed)
Controlled, no change in medication Managed by neurology

## 2015-05-29 ENCOUNTER — Telehealth: Payer: Self-pay | Admitting: Family Medicine

## 2015-05-29 NOTE — Telephone Encounter (Signed)
[name] went out yesterday to see Daneisha and states that she has increased her benazepril (LOTENSIN) 40 MG tablet starting today and her blood pressure yesterday was 150/90 per Nurse,

## 2015-05-29 NOTE — Telephone Encounter (Signed)
Noted needs to keep f/u here in office as scheduled

## 2015-05-29 NOTE — Telephone Encounter (Signed)
Home health nurse aware 

## 2015-06-01 ENCOUNTER — Ambulatory Visit (HOSPITAL_COMMUNITY)
Admission: RE | Admit: 2015-06-01 | Discharge: 2015-06-01 | Disposition: A | Discharge status: Home / Self Care | Payer: Medicaid Other | Source: Ambulatory Visit | Attending: Family Medicine | Admitting: Family Medicine

## 2015-06-01 DIAGNOSIS — Z1231 Encounter for screening mammogram for malignant neoplasm of breast: Secondary | ICD-10-CM

## 2015-06-04 ENCOUNTER — Telehealth: Payer: Self-pay

## 2015-06-04 ENCOUNTER — Other Ambulatory Visit: Payer: Self-pay

## 2015-06-04 MED ORDER — ROSUVASTATIN CALCIUM 20 MG PO TABS: 20.0000 mg | ORAL_TABLET | Freq: Every day | ORAL | Status: DC

## 2015-06-04 NOTE — Telephone Encounter (Signed)
LMOM for a return call.  

## 2015-06-04 NOTE — Telephone Encounter (Signed)
Diane called to get her mom scheduled. She thinks pt had colonoscopy years ago at [facility] and I will check on that. She also needs to get a list of her mom's medications.

## 2015-06-04 NOTE — Telephone Encounter (Signed)
Pt's daughter, [name], left Vm to call to schedule the colonoscopy.

## 2015-06-07 NOTE — Telephone Encounter (Signed)
I called Diane and she is at work. She will call me next week with the med list to get her mom scheduled. I made her aware that the only colonoscopy we could find was done by Dr. Tamala Julian in 12/09/2000 and she is past due for her next one.

## 2015-06-18 ENCOUNTER — Other Ambulatory Visit: Payer: Self-pay | Admitting: Family Medicine

## 2015-06-19 ENCOUNTER — Encounter: Payer: Self-pay | Admitting: "Endocrinology

## 2015-06-19 ENCOUNTER — Ambulatory Visit (INDEPENDENT_AMBULATORY_CARE_PROVIDER_SITE_OTHER): Payer: Medicaid Other | Admitting: "Endocrinology

## 2015-06-19 VITALS — BP 153/91 | HR 95 | Ht 65.0 in | Wt 239.0 lb

## 2015-06-19 DIAGNOSIS — C73 Malignant neoplasm of thyroid gland: Secondary | ICD-10-CM

## 2015-06-19 DIAGNOSIS — E890 Postprocedural hypothyroidism: Secondary | ICD-10-CM | POA: Diagnosis not present

## 2015-06-19 DIAGNOSIS — I10 Essential (primary) hypertension: Secondary | ICD-10-CM | POA: Diagnosis not present

## 2015-06-19 NOTE — Progress Notes (Signed)
Subjective:    Patient ID: [name], female    DOB: October 18, 1955,    Past Medical History  Diagnosis Date  . Obesity   . Depression   . Mental retardation, mild (I.Q. 50-70)   . Hyperlipidemia   . Seizure disorder ([place])   . Hypertension   . Thyroid cancer ([place])   . Seizure ([place])     11/17/2012 "still having them; "I believe their from a tumor in my head" ((11/17/2012   Past Surgical History  Procedure Laterality Date  . Breast lumpectomy  1996    right   . Total thyroidectomy Bilateral 11/17/2012  . Abdominal hysterectomy  2001  . Tubal ligation    . Thyroidectomy Bilateral 11/17/2012    Procedure: TOTAL THYROIDECTOMY;  Surgeon: Ascencion Dike, MD;  Location: [facility] OR;  Service: ENT;  Laterality: Bilateral;   Social History   Social History  . Marital Status: Single    Spouse Name: N/A  . Number of Children: 2  . Years of Education: N/A   Occupational History  . disabled     Social History Main Topics  . Smoking status: Never Smoker   . Smokeless tobacco: Never Used  . Alcohol Use: No  . Drug Use: No  . Sexual Activity: No   Other Topics Concern  . None   Social History Narrative   Outpatient Encounter Prescriptions as of 06/19/2015  Medication Sig  . acetaminophen (TYLENOL) 500 MG tablet Take 1 tablet (500 mg total) by mouth 2 (two) times daily.  . benazepril (LOTENSIN) 40 MG tablet Take 1 tablet (40 mg total) by mouth daily.  . calcium-vitamin D (OSCAL WITH D) 500-200 MG-UNIT per tablet Take 1 tablet by mouth 2 (two) times daily.  . ergocalciferol (VITAMIN D2) 50000 UNITS capsule Take 50,000 Units by mouth once a week.  Marland Kitchen FLUoxetine (PROZAC) 10 MG capsule TAKE (1) CAPSULE BY MOUTH ONCE DAILY.  . hydrochlorothiazide (HYDRODIURIL) 25 MG tablet TAKE ONE TABLET BY MOUTH ONCE DAILY.  Marland Kitchen lamoTRIgine (LAMICTAL) 100 MG tablet Take 100 mg by mouth daily.  Marland Kitchen levETIRAcetam (KEPPRA) 750 MG tablet Take 1,500 mg by mouth 2 (two) times daily.   Marland Kitchen levothyroxine (SYNTHROID,  LEVOTHROID) 125 MCG tablet TAKE 1 TABLET BY MOUTH DAILY BEFORE BREAKFAST.  Marland Kitchen loratadine (CLARITIN) 10 MG tablet TAKE ONE TABLET BY MOUTH ONCE DAILY.  Marland Kitchen omeprazole (PRILOSEC) 20 MG capsule TAKE 2 CAPSULES BY MOUTH ONCE DAILY.  . rosuvastatin (CRESTOR) 20 MG tablet Take 1 tablet (20 mg total) by mouth daily.   No facility-administered encounter medications on file as of 06/19/2015.   ALLERGIES: Allergies  Allergen Reactions  . Simvastatin Other (See Comments)    Muscle aches   VACCINATION STATUS: Immunization History  Administered Date(s) Administered  . H1N1 12/16/2007  . Influenza Split 11/24/2011  . Influenza Whole 11/30/2006, 12/07/2008, 12/26/2009  . Influenza,inj,Quad PF,36+ Mos 11/18/2012, 12/06/2013, 05/23/2015  . Pneumococcal Conjugate-13 04/03/2014  . Td 08/22/2003    HPI  60 yr old female with medical hx as above . she is here to f/u for post surgical hypothyroidism and for f/u of  thyroid cancer. -She missed her appointments since October 2016 and missed her appointment to do surveillance thyroid/neck ultrasound before this visit. She is s/p Thyrogen stimulated remnant ablation with negative post therapy WBS. 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 She is now on Synthroid 125 g by mouth  every morning.  -She is compliant  to her thyroid hormone ,denies any new complaints.   she denies palpitations. she denies dysphagia, SOB, nor voice change. she denies family hx of thyroid cancer.  Review of Systems  Constitutional: Positive for fatigue. Negative for unexpected weight change.  HENT: Negative for trouble swallowing and voice change.   Eyes: Negative for visual disturbance.  Respiratory: Negative for cough, shortness of breath and wheezing.   Cardiovascular: Negative for chest pain, palpitations and leg swelling.  Gastrointestinal: Negative for nausea, vomiting and diarrhea.   Endocrine: Negative for cold intolerance, heat intolerance, polydipsia, polyphagia and polyuria.  Musculoskeletal: Negative for myalgias and arthralgias.  Skin: Negative for color change, pallor, rash and wound.  Neurological: Negative for seizures and headaches.  Psychiatric/Behavioral: Negative for suicidal ideas and confusion.    Objective:    BP 153/91 mmHg  Pulse 95  Ht 5\' 5"  (1.651 m)  Wt 239 lb (108.41 kg)  BMI 39.77 kg/m2  SpO2 98%  Wt Readings from Last 3 Encounters:  06/19/15 239 lb (108.41 kg)  05/23/15 239 lb (108.41 kg)  11/29/14 235 lb (106.595 kg)    Physical Exam  Constitutional: She is oriented to person, place, and time. She appears well-developed.  HENT:  Head: Normocephalic and atraumatic.  Eyes: EOM are normal.  Neck: Normal range of motion. Neck supple. No tracheal deviation present.  Post thyroidectomy scar, no gross mass lesion on PE.  Cardiovascular: Normal rate and regular rhythm.   Pulmonary/Chest: Effort normal and breath sounds normal.  Abdominal: Soft. Bowel sounds are normal. There is no tenderness. There is no guarding.  Musculoskeletal: Normal range of motion. She exhibits no edema.  Neurological: She is alert and oriented to person, place, and time. She has normal reflexes. No cranial nerve deficit. Coordination normal.  Skin: Skin is warm and dry. No rash noted. No erythema. No pallor.  Psychiatric: She has a normal mood and affect. Judgment normal.    Results for orders placed or performed in visit on 05/23/15  Hepatitis C antibody  Result Value Ref Range   HCV Ab NEGATIVE NEGATIVE  HIV antibody  Result Value Ref Range   HIV 1&2 Ab, 4th Generation NONREACTIVE NONREACTIVE  Lipid panel  Result Value Ref Range   Cholesterol 253 (H) 125 - 200 mg/dL   Triglycerides 124 <150 mg/dL   HDL 43 (L) >=46 mg/dL   Total CHOL/HDL Ratio 5.9 (H) <=5.0 Ratio   VLDL 25 <30 mg/dL   LDL Cholesterol 185 (H) <130 mg/dL  VITAMIN D 25 Hydroxy (Vit-D  Deficiency, Fractures)  Result Value Ref Range   Vit D, 25-Hydroxy 57 30 - 100 ng/mL  TSH  Result Value Ref Range   TSH 0.22 (L) mIU/L  CBC  Result Value Ref Range   WBC 4.4 4.0 - 10.5 K/uL   RBC 4.22 3.87 - 5.11 MIL/uL   Hemoglobin 12.0 12.0 - 15.0 g/dL   HCT 37.0 36.0 - 46.0 %   MCV 87.7 78.0 - 100.0 fL   MCH 28.4 26.0 - 34.0 pg   MCHC 32.4 30.0 - 36.0 g/dL   RDW 14.3 11.5 - 15.5 %   Platelets 233 150 - 400 K/uL   MPV 10.3 8.6 - 12.4 fL   Complete Blood Count (Most recent): Lab Results  Component Value Date   WBC 4.4 05/23/2015   HGB 12.0 05/23/2015   HCT 37.0 05/23/2015   MCV 87.7 05/23/2015   PLT 233 05/23/2015   Chemistry (most recent):  Lab Results  Component Value Date   NA 141 05/23/2015   K 4.0 05/23/2015   CL 101 05/23/2015   CO2 26 05/23/2015   BUN 26* 05/23/2015   CREATININE 1.50* 05/23/2015   Diabetic Labs (most recent): Lab Results  Component Value Date   HGBA1C 5.3 08/10/2012   Lipid profile (most recent): Lab Results  Component Value Date   TRIG 124 05/23/2015   CHOL 253* 05/23/2015         Assessment & Plan:   1. Hypothyroidism, postsurgical Her labs are  Incomplete and she missed a court for surveillance thyroid/neck ultrasound. -I will continue  Synthroid 125 mcg po qam.  - We discussed about correct intake of levothyroxine, at fasting, with water, separated by at least 30 minutes from breakfast, and separated by more than 4 hours from calcium, iron, multivitamins, acid reflux medications (PPIs). -Patient is made aware of the fact that thyroid hormone replacement is needed for life, dose to be adjusted by periodic monitoring of thyroid function tests.   2. Papillary thyroid carcinoma ([place]) Her  surveillance neck /thyroid u/s from 11/17/2013 was c/w surgically absent thyroid. -She will need another thyroid/neck ultrasound for surveillance. This will be scheduled to be done as soon as possible. Pt is s/p thyrogen stimulated thyroid  remnant ablation with WBS on 01/14/2013 with no evidence of iodine avid metastasis. She has had pT2,Nx,Mx FVPTC, s/p near total thyroidectomy on September 24,2014. She is counseled to maintain high degree of compliance with synthroid therapy with aim of keeping TSH low normal ( 0.1-0.5).  -she is advised on the necessity of follow-ups and imaging studies at least one annually for the next 5 years. - She will need surveillance [place] of Soft Tissue Head/Neck; before her next visit.   3. Essential hypertension - uncontrolled patient is advised to continue Benazepril  20 mg by mouth daily and hydrochlorothiazide 25 mg by mouth daily.  4. Obesity, Class II, BMI 35.0-39.9, with comorbidity (see actual BMI) ([employer]) Patient is provided with weight loss advice- limit carbohydrate intake,  increase protein intake in exercise.  Follow up plan: Return in about 2 weeks (around 07/03/2015) for follow up with pre-visit labs, Thyroid Ultrasound.  Glade Lloyd, MD Phone: [phone]  Fax: [phone]   06/19/2015, 12:11 PM

## 2015-06-20 ENCOUNTER — Other Ambulatory Visit: Payer: Self-pay | Admitting: Family Medicine

## 2015-06-25 ENCOUNTER — Ambulatory Visit (HOSPITAL_COMMUNITY): Payer: Medicaid Other

## 2015-06-25 ENCOUNTER — Ambulatory Visit (HOSPITAL_COMMUNITY): Admission: RE | Admit: 2015-06-25 | Payer: Medicaid Other | Source: Ambulatory Visit

## 2015-06-29 ENCOUNTER — Ambulatory Visit (HOSPITAL_COMMUNITY): Admission: RE | Admit: 2015-06-29 | Payer: Medicaid Other | Source: Ambulatory Visit

## 2015-06-30 LAB — TSH: TSH: 0.04 mIU/L — ABNORMAL LOW

## 2015-06-30 LAB — T4, FREE: Free T4: 1.9 ng/dL — ABNORMAL HIGH (ref 0.8–1.8)

## 2015-07-02 DIAGNOSIS — R569 Unspecified convulsions: Secondary | ICD-10-CM

## 2015-07-02 DIAGNOSIS — F78 Other intellectual disabilities: Secondary | ICD-10-CM

## 2015-07-02 DIAGNOSIS — I10 Essential (primary) hypertension: Secondary | ICD-10-CM

## 2015-07-03 ENCOUNTER — Encounter: Payer: Medicaid Other | Admitting: "Endocrinology

## 2015-07-05 NOTE — Progress Notes (Signed)
This encounter was created in error - please disregard.

## 2015-07-12 ENCOUNTER — Other Ambulatory Visit: Payer: Self-pay | Admitting: Family Medicine

## 2015-07-12 ENCOUNTER — Other Ambulatory Visit: Payer: Self-pay

## 2015-07-12 MED ORDER — LEVOTHYROXINE SODIUM 125 MCG PO TABS: ORAL_TABLET | ORAL | Status: DC

## 2015-07-20 ENCOUNTER — Other Ambulatory Visit: Payer: Self-pay | Admitting: Family Medicine

## 2015-08-16 ENCOUNTER — Ambulatory Visit (HOSPITAL_COMMUNITY)
Admission: RE | Admit: 2015-08-16 | Discharge: 2015-08-16 | Disposition: A | Discharge status: Home / Self Care | Payer: Medicaid Other | Source: Ambulatory Visit | Attending: "Endocrinology | Admitting: "Endocrinology

## 2015-08-16 DIAGNOSIS — R590 Localized enlarged lymph nodes: Secondary | ICD-10-CM | POA: Insufficient documentation

## 2015-08-16 DIAGNOSIS — C73 Malignant neoplasm of thyroid gland: Secondary | ICD-10-CM

## 2015-08-20 ENCOUNTER — Encounter: Payer: Self-pay | Admitting: "Endocrinology

## 2015-08-20 ENCOUNTER — Other Ambulatory Visit: Payer: Self-pay | Admitting: Family Medicine

## 2015-08-20 ENCOUNTER — Ambulatory Visit (INDEPENDENT_AMBULATORY_CARE_PROVIDER_SITE_OTHER): Payer: Medicaid Other | Admitting: "Endocrinology

## 2015-08-20 VITALS — BP 132/84 | HR 73 | Ht 65.0 in | Wt 241.0 lb

## 2015-08-20 DIAGNOSIS — C73 Malignant neoplasm of thyroid gland: Secondary | ICD-10-CM

## 2015-08-20 DIAGNOSIS — E890 Postprocedural hypothyroidism: Secondary | ICD-10-CM | POA: Diagnosis not present

## 2015-08-20 DIAGNOSIS — IMO0001 Reserved for inherently not codable concepts without codable children: Secondary | ICD-10-CM

## 2015-08-20 DIAGNOSIS — I10 Essential (primary) hypertension: Secondary | ICD-10-CM | POA: Diagnosis not present

## 2015-08-20 MED ORDER — LEVOTHYROXINE SODIUM 112 MCG PO TABS: ORAL_TABLET | ORAL | Status: DC

## 2015-08-20 NOTE — Progress Notes (Signed)
Subjective:    Patient ID: [name], female    DOB: July 27, 1955,    Past Medical History  Diagnosis Date  . Obesity   . Depression   . Mental retardation, mild (I.Q. 50-70)   . Hyperlipidemia   . Seizure disorder ([place])   . Hypertension   . Thyroid cancer ([place])   . Seizure ([place])     11/17/2012 "still having them; "I believe their from a tumor in my head" ((11/17/2012   Past Surgical History  Procedure Laterality Date  . Breast lumpectomy  1996    right   . Total thyroidectomy Bilateral 11/17/2012  . Abdominal hysterectomy  2001  . Tubal ligation    . Thyroidectomy Bilateral 11/17/2012    Procedure: TOTAL THYROIDECTOMY;  Surgeon: Ascencion Dike, MD;  Location: [facility] OR;  Service: ENT;  Laterality: Bilateral;   Social History   Social History  . Marital Status: Single    Spouse Name: N/A  . Number of Children: 2  . Years of Education: N/A   Occupational History  . disabled     Social History Main Topics  . Smoking status: Never Smoker   . Smokeless tobacco: Never Used  . Alcohol Use: No  . Drug Use: No  . Sexual Activity: No   Other Topics Concern  . None   Social History Narrative   Outpatient Encounter Prescriptions as of 08/20/2015  Medication Sig  . acetaminophen (TYLENOL) 500 MG tablet Take 1 tablet (500 mg total) by mouth 2 (two) times daily.  . benazepril (LOTENSIN) 40 MG tablet Take 1 tablet (40 mg total) by mouth daily.  . calcium-vitamin D (OSCAL WITH D) 500-200 MG-UNIT per tablet Take 1 tablet by mouth 2 (two) times daily.  . ergocalciferol (VITAMIN D2) 50000 UNITS capsule Take 50,000 Units by mouth once a week.  . hydrochlorothiazide (HYDRODIURIL) 25 MG tablet TAKE ONE TABLET BY MOUTH ONCE DAILY.  Marland Kitchen lamoTRIgine (LAMICTAL) 100 MG tablet Take 100 mg by mouth daily.  Marland Kitchen levETIRAcetam (KEPPRA) 750 MG tablet Take 1,500 mg by mouth 2 (two) times daily.   Marland Kitchen levothyroxine (SYNTHROID, LEVOTHROID) 112 MCG tablet TAKE 1 TABLET BY MOUTH DAILY BEFORE BREAKFAST.   . [DISCONTINUED] FLUoxetine (PROZAC) 10 MG capsule TAKE (1) CAPSULE BY MOUTH ONCE DAILY.  . [DISCONTINUED] levothyroxine (SYNTHROID, LEVOTHROID) 125 MCG tablet TAKE 1 TABLET BY MOUTH DAILY BEFORE BREAKFAST.  . [DISCONTINUED] loratadine (CLARITIN) 10 MG tablet TAKE ONE TABLET BY MOUTH ONCE DAILY.  . [DISCONTINUED] omeprazole (PRILOSEC) 20 MG capsule TAKE 2 CAPSULES BY MOUTH ONCE DAILY.  . [DISCONTINUED] rosuvastatin (CRESTOR) 20 MG tablet Take 1 tablet (20 mg total) by mouth daily.   No facility-administered encounter medications on file as of 08/20/2015.   ALLERGIES: Allergies  Allergen Reactions  . Simvastatin Other (See Comments)    Muscle aches   VACCINATION STATUS: Immunization History  Administered Date(s) Administered  . H1N1 12/16/2007  . Influenza Split 11/24/2011  . Influenza Whole 11/30/2006, 12/07/2008, 12/26/2009  . Influenza,inj,Quad PF,36+ Mos 11/18/2012, 12/06/2013, 05/23/2015  . Pneumococcal Conjugate-13 04/03/2014  . Td 08/22/2003    HPI  60 yr old female with medical hx as above . she is here to f/u for post surgical hypothyroidism and for f/u of  thyroid cancer. -She missed her appointments since October 2016 and missed her appointment to do surveillance thyroid/neck ultrasound before this visit. She is s/p Thyrogen stimulated remnant ablation with negative post therapy WBS. 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 She is now on Synthroid 125 g by mouth every morning.  -She is compliant  to her thyroid hormone ,denies any new complaints.   she denies palpitations. she denies dysphagia, SOB, nor voice change. she denies family hx of thyroid cancer.  Review of Systems  Constitutional: Positive for fatigue. Negative for unexpected weight change.  HENT: Negative for trouble swallowing and voice change.   Eyes: Negative for visual disturbance.  Respiratory: Negative for  cough, shortness of breath and wheezing.   Cardiovascular: Negative for chest pain, palpitations and leg swelling.  Gastrointestinal: Negative for nausea, vomiting and diarrhea.  Endocrine: Negative for cold intolerance, heat intolerance, polydipsia, polyphagia and polyuria.  Musculoskeletal: Negative for myalgias and arthralgias.  Skin: Negative for color change, pallor, rash and wound.  Neurological: Negative for seizures and headaches.  Psychiatric/Behavioral: Negative for suicidal ideas and confusion.    Objective:    BP 132/84 mmHg  Pulse 73  Ht 5\' 5"  (1.651 m)  Wt 241 lb (109.317 kg)  BMI 40.10 kg/m2  SpO2 98%  Wt Readings from Last 3 Encounters:  08/20/15 241 lb (109.317 kg)  06/19/15 239 lb (108.41 kg)  05/23/15 239 lb (108.41 kg)    Physical Exam  Constitutional: She is oriented to person, place, and time. She appears well-developed.  HENT:  Head: Normocephalic and atraumatic.  Eyes: EOM are normal.  Neck: Normal range of motion. Neck supple. No tracheal deviation present.  Post thyroidectomy scar, no gross mass lesion on PE.  Cardiovascular: Normal rate and regular rhythm.   Pulmonary/Chest: Effort normal and breath sounds normal.  Abdominal: Soft. Bowel sounds are normal. There is no tenderness. There is no guarding.  Musculoskeletal: Normal range of motion. She exhibits no edema.  Neurological: She is alert and oriented to person, place, and time. She has normal reflexes. No cranial nerve deficit. Coordination normal.  Skin: Skin is warm and dry. No rash noted. No erythema. No pallor.  Psychiatric: She has a normal mood and affect. Judgment normal.    Results for orders placed or performed in visit on 06/19/15  T4, free  Result Value Ref Range   Free T4 1.9 (H) 0.8 - 1.8 ng/dL  TSH  Result Value Ref Range   TSH 0.04 (L) mIU/L   Complete Blood Count (Most recent): Lab Results  Component Value Date   WBC 4.4 05/23/2015   HGB 12.0 05/23/2015   HCT 37.0  05/23/2015   MCV 87.7 05/23/2015   PLT 233 05/23/2015   Chemistry (most recent): Lab Results  Component Value Date   NA 141 05/23/2015   K 4.0 05/23/2015   CL 101 05/23/2015   CO2 26 05/23/2015   BUN 26* 05/23/2015   CREATININE 1.50* 05/23/2015   Diabetic Labs (most recent): Lab Results  Component Value Date   HGBA1C 5.3 08/10/2012   Lipid Panel     Component Value Date/Time   CHOL 253* 05/23/2015 1605   TRIG 124 05/23/2015 1605   HDL 43* 05/23/2015 1605   CHOLHDL 5.9* 05/23/2015 1605   VLDL 25 05/23/2015 1605   LDLCALC 185* 05/23/2015 1605       Assessment & Plan:   1. Hypothyroidism, postsurgical -Based on her most recent thyroid function tests, her doses need adjustment. I will lower her Synthroid to 112 g by mouth every morning.   - We discussed about correct intake of levothyroxine, at fasting, with water, separated by at least  30 minutes from breakfast, and separated by more than 4 hours from calcium, iron, multivitamins, acid reflux medications (PPIs). -Patient is made aware of the fact that thyroid hormone replacement is needed for life, dose to be adjusted by periodic monitoring of thyroid function tests.   2. Papillary thyroid carcinoma ([place]) Her  surveillance neck /thyroid u/s from 11/17/2013 was c/w surgically absent thyroid, however there is a 0.9 cm left cervical lymph node. See below.  Pt is s/p thyrogen stimulated thyroid remnant ablation with WBS on 01/14/2013 with no evidence of iodine avid metastasis. She has had pT2,Nx,Mx FVPTC, s/p near total thyroidectomy on September 24,2014. She is counseled to maintain high degree of compliance with synthroid therapy with aim of keeping TSH low normal ( 0.1-0.5).  -she is advised on the necessity of follow-ups and imaging studies at least one annually for the next 5 years. - She will need  core biopsy of this suspect lymph node. -Biopsy results are significant for recurrent malignancy she will be sent for  neck dissection/reexploration.  3. Essential hypertension - uncontrolled patient is advised to continue Benazepril  20 mg by mouth daily and hydrochlorothiazide 25 mg by mouth daily.  4. Obesity, Class II, BMI 35.0-39.9, with comorbidity (see actual BMI) ([place]) Patient is provided with weight loss advice- limit carbohydrate intake,  increase protein intake in exercise.  Follow up plan: Return in about 3 weeks (around 09/10/2015) for biopsy results.  Glade Lloyd, MD Phone: [phone]  Fax: [phone]   08/20/2015, 5:07 PM

## 2015-08-21 ENCOUNTER — Other Ambulatory Visit: Payer: Self-pay | Admitting: "Endocrinology

## 2015-08-21 DIAGNOSIS — C73 Malignant neoplasm of thyroid gland: Secondary | ICD-10-CM

## 2015-08-22 ENCOUNTER — Other Ambulatory Visit: Payer: Self-pay | Admitting: "Endocrinology

## 2015-08-22 DIAGNOSIS — C73 Malignant neoplasm of thyroid gland: Secondary | ICD-10-CM

## 2015-08-23 ENCOUNTER — Ambulatory Visit (INDEPENDENT_AMBULATORY_CARE_PROVIDER_SITE_OTHER): Payer: Medicaid Other | Admitting: Family Medicine

## 2015-08-23 ENCOUNTER — Encounter: Payer: Self-pay | Admitting: Family Medicine

## 2015-08-23 VITALS — BP 138/82 | HR 98 | Resp 16 | Ht 65.0 in | Wt 240.0 lb

## 2015-08-23 DIAGNOSIS — R569 Unspecified convulsions: Secondary | ICD-10-CM

## 2015-08-23 DIAGNOSIS — Z1211 Encounter for screening for malignant neoplasm of colon: Secondary | ICD-10-CM | POA: Diagnosis not present

## 2015-08-23 DIAGNOSIS — F329 Major depressive disorder, single episode, unspecified: Secondary | ICD-10-CM

## 2015-08-23 DIAGNOSIS — E785 Hyperlipidemia, unspecified: Secondary | ICD-10-CM

## 2015-08-23 DIAGNOSIS — I10 Essential (primary) hypertension: Secondary | ICD-10-CM

## 2015-08-23 DIAGNOSIS — F419 Anxiety disorder, unspecified: Secondary | ICD-10-CM

## 2015-08-23 DIAGNOSIS — F418 Other specified anxiety disorders: Secondary | ICD-10-CM

## 2015-08-23 DIAGNOSIS — F32A Depression, unspecified: Secondary | ICD-10-CM

## 2015-08-23 DIAGNOSIS — IMO0001 Reserved for inherently not codable concepts without codable children: Secondary | ICD-10-CM

## 2015-08-23 DIAGNOSIS — K219 Gastro-esophageal reflux disease without esophagitis: Secondary | ICD-10-CM

## 2015-08-23 LAB — LIPID PANEL
Cholesterol: 191 mg/dL (ref 125–200)
HDL: 53 mg/dL (ref >=46)
LDL Cholesterol: 124 mg/dL (ref <130)
Total CHOL/HDL Ratio: 3.6 Ratio (ref <=5.0)
Triglycerides: 69 mg/dL (ref <150)
VLDL: 14 mg/dL (ref <30)

## 2015-08-23 LAB — COMPLETE METABOLIC PANEL WITH GFR
ALT: 14 U/L (ref 6–29)
AST: 20 U/L (ref 10–35)
Albumin: 4 g/dL (ref 3.6–5.1)
Alkaline Phosphatase: 122 U/L (ref 33–130)
BUN: 29 mg/dL — ABNORMAL HIGH (ref 7–25)
CO2: 25 mmol/L (ref 20–31)
Calcium: 10 mg/dL (ref 8.6–10.4)
Chloride: 102 mmol/L (ref 98–110)
Creat: 1.46 mg/dL — ABNORMAL HIGH (ref 0.50–0.99)
GFR, Est African American: 44 mL/min — ABNORMAL LOW (ref >=60)
GFR, Est Non African American: 39 mL/min — ABNORMAL LOW (ref >=60)
Glucose, Bld: 88 mg/dL (ref 65–99)
Potassium: 4.6 mmol/L (ref 3.5–5.3)
Sodium: 139 mmol/L (ref 135–146)
Total Bilirubin: 0.5 mg/dL (ref 0.2–1.2)
Total Protein: 8 g/dL (ref 6.1–8.1)

## 2015-08-23 MED ORDER — LORATADINE 10 MG PO TABS: 10.0000 mg | ORAL_TABLET | Freq: Every day | ORAL | Status: DC

## 2015-08-23 MED ORDER — FLUOXETINE HCL 10 MG PO CAPS: ORAL_CAPSULE | ORAL | Status: DC

## 2015-08-23 MED ORDER — OMEPRAZOLE 20 MG PO CPDR: DELAYED_RELEASE_CAPSULE | ORAL | Status: DC

## 2015-08-23 MED ORDER — BENAZEPRIL HCL 40 MG PO TABS: 40.0000 mg | ORAL_TABLET | Freq: Every day | ORAL | Status: DC

## 2015-08-23 MED ORDER — ROSUVASTATIN CALCIUM 20 MG PO TABS: 20.0000 mg | ORAL_TABLET | Freq: Every day | ORAL | Status: DC

## 2015-08-23 MED ORDER — HYDROCHLOROTHIAZIDE 25 MG PO TABS: 25.0000 mg | ORAL_TABLET | Freq: Every day | ORAL | Status: DC

## 2015-08-23 NOTE — Assessment & Plan Note (Signed)
Deteriorated. Patient re-educated about  the importance of commitment to a  minimum of 150 minutes of exercise per week.  The importance of healthy food choices with portion control discussed. Encouraged to start a food diary, count calories and to consider  joining a support group. Sample diet sheets offered. Goals set by the patient for the next several months.   Weight /BMI 08/23/2015 08/20/2015 06/19/2015  WEIGHT 240 lb 241 lb 239 lb  HEIGHT 5\' 5"  5\' 5"  5\' 5"   BMI 39.94 kg/m2 40.1 kg/m2 39.77 kg/m2

## 2015-08-23 NOTE — Assessment & Plan Note (Signed)
Improved, no med change Hyperlipidemia:Low fat diet discussed and encouraged.   Lipid Panel  Lab Results  Component Value Date   CHOL 191 08/23/2015   HDL 53 08/23/2015   LDLCALC 124 08/23/2015   TRIG 69 08/23/2015   CHOLHDL 3.6 08/23/2015     "

## 2015-08-23 NOTE — Progress Notes (Signed)
[name]     MRN: [id number]      DOB: January 27, 1956   HPI [name] is here for follow up and re-evaluation of chronic medical conditions, medication management and review of any available recent lab and radiology data.  Preventive health is updated, specifically  Cancer screening and Immunization.  Needs colonoscopy Questions or concerns regarding consultations or procedures which the PT has had in the interim are  Addressed.Has biopsy of neck node next weekThe PT denies any adverse reactions to current medications since the last visit.  There are no new concerns.  No regular exercise with obesity and high cholesterol ROS Denies recent fever or chills. Denies sinus pressure, nasal congestion, ear pain or sore throat. Denies chest congestion, productive cough or wheezing. Denies chest pains, palpitations and leg swelling Denies abdominal pain, nausea, vomiting,diarrhea or constipation.   Denies dysuria, frequency, hesitancy or incontinence. Denies joint pain, swelling and limitation in mobility. Denies headaches, seizures, numbness, or tingling. Denies depression, anxiety or insomnia. Denies skin break down or rash.   PE  BP 138/82 mmHg  Pulse 98  Resp 16  Ht 5\' 5"  (1.651 m)  Wt 240 lb (108.863 kg)  BMI 39.94 kg/m2  SpO2 99%  Patient alert and oriented and in no cardiopulmonary distress.  HEENT: No facial asymmetry, EOMI,   oropharynx pink and moist.  Neck supple no JVD, no mass.  Chest: Clear to auscultation bilaterally.  CVS: S1, S2 no murmurs, no S3.Regular rate.  ABD: Soft non tender.   Ext: No edema  MS: Adequate ROM spine, shoulders, hips and knees.  Skin: Intact, no ulcerations or rash noted.  Psych: Good eye contact, normal affect. Memory intact not anxious or depressed appearing.  CNS: CN 2-12 intact, power,  normal throughout.no focal deficits noted.   Assessment & Plan   Essential hypertension Controlled, no change in medication DASH diet  and commitment to daily physical activity for a minimum of 30 minutes discussed and encouraged, as a part of hypertension management. The importance of attaining a healthy weight is also discussed.  BP/Weight 08/23/2015 08/20/2015 06/19/2015 05/23/2015 11/29/2014 09/06/2014 123XX123  Systolic BP 0000000 Q000111Q 0000000 [id number] Q000111Q  Diastolic BP 82 84 91 72 81 80 84  Wt. (Lbs) 240 241 239 239 235 231 231  BMI 39.94 40.1 39.77 39.77 37.37 38.44 38.44        Hyperlipidemia LDL goal <100 Improved, no med change Hyperlipidemia:Low fat diet discussed and encouraged.   Lipid Panel  Lab Results  Component Value Date   CHOL 191 08/23/2015   HDL 53 08/23/2015   LDLCALC 124 08/23/2015   TRIG 69 08/23/2015   CHOLHDL 3.6 08/23/2015     "   Obesity, Class II, BMI 35.0-39.9, with comorbidity (see actual BMI) Deteriorated. Patient re-educated about  the importance of commitment to a  minimum of 150 minutes of exercise per week.  The importance of healthy food choices with portion control discussed. Encouraged to start a food diary, count calories and to consider  joining a support group. Sample diet sheets offered. Goals set by the patient for the next several months.   Weight /BMI 08/23/2015 08/20/2015 06/19/2015  WEIGHT 240 lb 241 lb 239 lb  HEIGHT 5\' 5"  5\' 5"  5\' 5"   BMI 39.94 kg/m2 40.1 kg/m2 39.77 kg/m2       Anxiety and depression Controlled, no change in medication   Convulsions Controlled, no change in medication   GERD (gastroesophageal reflux disease) Controlled,  no change in medication

## 2015-08-23 NOTE — Assessment & Plan Note (Signed)
Controlled, no change in medication  

## 2015-08-23 NOTE — Assessment & Plan Note (Signed)
Controlled, no change in medication DASH diet and commitment to daily physical activity for a minimum of 30 minutes discussed and encouraged, as a part of hypertension management. The importance of attaining a healthy weight is also discussed.  BP/Weight 08/23/2015 08/20/2015 06/19/2015 05/23/2015 11/29/2014 09/06/2014 123XX123  Systolic BP 0000000 Q000111Q 0000000 [id number] [id number] Q000111Q  Diastolic BP 82 84 91 72 81 80 84  Wt. (Lbs) 240 241 239 239 235 231 231  BMI 39.94 40.1 39.77 39.77 37.37 38.44 38.44

## 2015-08-23 NOTE — Patient Instructions (Addendum)
Annual physical exam in 4 months, call if you need me before  Fasting lipid, cmp and EGFR today  All the best with biopsy next week You are referred for a colonoscopy, you will receive a letter from  Dr Olevia Perches office to schedule  Please work on good  health habits so that your health will improve. 1. Commitment to daily physical activity for 30 to 60  minutes, if you are able to do this.  2. Commitment to wise food choices. Aim for half of your  food intake to be vegetable and fruit, one quarter starchy foods, and one quarter protein. Try to eat on a regular schedule  3 meals per day, snacking between meals should be limited to vegetables or fruits or small portions of nuts. 64 ounces of water per day is generally recommended, unless you have specific health conditions, like heart failure or kidney failure where you will need to limit fluid intake.  3. Commitment to sufficient and a  good quality of physical and mental rest daily, generally between 6 to 8 hours per day.  WITH PERSISTANCE AND PERSEVERANCE, THE IMPOSSIBLE , BECOMES THE NORM! Thank you  for choosing [facility]. We consider it a privelige to serve you.  Delivering excellent health care in a caring and  compassionate way is our goal.  Partnering with you,  so that together we can achieve this goal is our strategy.

## 2015-08-27 ENCOUNTER — Encounter (INDEPENDENT_AMBULATORY_CARE_PROVIDER_SITE_OTHER): Payer: Self-pay | Admitting: *Deleted

## 2015-08-30 ENCOUNTER — Other Ambulatory Visit: Payer: Self-pay | Admitting: Radiology

## 2015-08-31 ENCOUNTER — Encounter (HOSPITAL_COMMUNITY): Payer: Self-pay

## 2015-08-31 ENCOUNTER — Other Ambulatory Visit: Payer: Self-pay | Admitting: "Endocrinology

## 2015-08-31 ENCOUNTER — Ambulatory Visit (HOSPITAL_COMMUNITY)
Admission: RE | Admit: 2015-08-31 | Discharge: 2015-08-31 | Disposition: A | Discharge status: Home / Self Care | Payer: Medicaid Other | Source: Ambulatory Visit | Attending: "Endocrinology | Admitting: "Endocrinology

## 2015-08-31 DIAGNOSIS — Z923 Personal history of irradiation: Secondary | ICD-10-CM | POA: Diagnosis not present

## 2015-08-31 DIAGNOSIS — F78 Other intellectual disabilities: Secondary | ICD-10-CM

## 2015-08-31 DIAGNOSIS — I10 Essential (primary) hypertension: Secondary | ICD-10-CM | POA: Diagnosis not present

## 2015-08-31 DIAGNOSIS — F329 Major depressive disorder, single episode, unspecified: Secondary | ICD-10-CM | POA: Insufficient documentation

## 2015-08-31 DIAGNOSIS — G40909 Epilepsy, unspecified, not intractable, without status epilepticus: Secondary | ICD-10-CM | POA: Insufficient documentation

## 2015-08-31 DIAGNOSIS — Z538 Procedure and treatment not carried out for other reasons: Secondary | ICD-10-CM | POA: Diagnosis not present

## 2015-08-31 DIAGNOSIS — F70 Mild intellectual disabilities: Secondary | ICD-10-CM | POA: Insufficient documentation

## 2015-08-31 DIAGNOSIS — Z8249 Family history of ischemic heart disease and other diseases of the circulatory system: Secondary | ICD-10-CM | POA: Insufficient documentation

## 2015-08-31 DIAGNOSIS — Z79899 Other long term (current) drug therapy: Secondary | ICD-10-CM | POA: Insufficient documentation

## 2015-08-31 DIAGNOSIS — R569 Unspecified convulsions: Secondary | ICD-10-CM

## 2015-08-31 DIAGNOSIS — Z85850 Personal history of malignant neoplasm of thyroid: Secondary | ICD-10-CM | POA: Diagnosis not present

## 2015-08-31 DIAGNOSIS — E785 Hyperlipidemia, unspecified: Secondary | ICD-10-CM | POA: Insufficient documentation

## 2015-08-31 DIAGNOSIS — R590 Localized enlarged lymph nodes: Secondary | ICD-10-CM | POA: Insufficient documentation

## 2015-08-31 DIAGNOSIS — C73 Malignant neoplasm of thyroid gland: Secondary | ICD-10-CM

## 2015-08-31 LAB — CBC WITH DIFFERENTIAL/PLATELET
Basophils Absolute: 0 10*3/uL (ref 0.0–0.1)
Basophils Relative: 1 %
Eosinophils Absolute: 0 10*3/uL (ref 0.0–0.7)
Eosinophils Relative: 1 %
HCT: 36.2 % (ref 36.0–46.0)
Hemoglobin: 11.8 g/dL — ABNORMAL LOW (ref 12.0–15.0)
Lymphocytes Relative: 37 %
Lymphs Abs: 1.5 10*3/uL (ref 0.7–4.0)
MCH: 27.7 pg (ref 26.0–34.0)
MCHC: 32.6 g/dL (ref 30.0–36.0)
MCV: 85 fL (ref 78.0–100.0)
Monocytes Absolute: 0.3 10*3/uL (ref 0.1–1.0)
Monocytes Relative: 7 %
Neutro Abs: 2.2 10*3/uL (ref 1.7–7.7)
Neutrophils Relative %: 54 %
Platelets: 182 10*3/uL (ref 150–400)
RBC: 4.26 MIL/uL (ref 3.87–5.11)
RDW: 13.4 % (ref 11.5–15.5)
WBC: 4.1 10*3/uL (ref 4.0–10.5)

## 2015-08-31 LAB — BASIC METABOLIC PANEL
Anion gap: 7 (ref 5–15)
BUN: 30 mg/dL — ABNORMAL HIGH (ref 6–20)
CO2: 21 mmol/L — ABNORMAL LOW (ref 22–32)
Calcium: 9.9 mg/dL (ref 8.9–10.3)
Chloride: 111 mmol/L (ref 101–111)
Creatinine, Ser: 1.5 mg/dL — ABNORMAL HIGH (ref 0.44–1.00)
GFR calc Af Amer: 42 mL/min — ABNORMAL LOW (ref >60)
GFR calc non Af Amer: 36 mL/min — ABNORMAL LOW (ref >60)
Glucose, Bld: 84 mg/dL (ref 65–99)
Potassium: 4.4 mmol/L (ref 3.5–5.1)
Sodium: 139 mmol/L (ref 135–145)

## 2015-08-31 LAB — PROTIME-INR
INR: 1.07 (ref 0.00–1.49)
Prothrombin Time: 14.1 seconds (ref 11.6–15.2)

## 2015-08-31 MED ORDER — SODIUM CHLORIDE 0.9 % IV SOLN
INTRAVENOUS | Status: DC
  Administered 2015-08-31: 12:00:00 via INTRAVENOUS

## 2015-08-31 NOTE — Procedures (Signed)
No evidence of adenopathy. Bx not performed. No comp/EBL

## 2015-08-31 NOTE — H&P (Signed)
Chief Complaint: Patient was seen in consultation today for left cervical lymph node biopsy at the request of Nida,Gebreselassie W  Referring Physician(s): Nida,Gebreselassie W  Supervising Physician: Marybelle Killings  Patient Status: Outpatient  History of Present Illness: [name] is a 60 y.o. female   Pt with Thyroid Ca hx---2014 Thyroidectomy and radiation treatment Follow up with Dr Dorris Fetch [place] 08/16/15: IMPRESSION: Borderline left cervical adenopathy is present. Recurrence of malignancy cannot be excluded.  Now scheduled for biopsy of same   Past Medical History  Diagnosis Date  . Obesity   . Depression   . Mental retardation, mild (I.Q. 50-70)   . Hyperlipidemia   . Seizure disorder ([place])   . Hypertension   . Thyroid cancer ([place])   . Seizure ([place])     11/17/2012 "still having them; "I believe their from a tumor in my head" ((11/17/2012    Past Surgical History  Procedure Laterality Date  . Breast lumpectomy  1996    right   . Total thyroidectomy Bilateral 11/17/2012  . Abdominal hysterectomy  2001  . Tubal ligation    . Thyroidectomy Bilateral 11/17/2012    Procedure: TOTAL THYROIDECTOMY;  Surgeon: Ascencion Dike, MD;  Location: [facility] OR;  Service: ENT;  Laterality: Bilateral;    Allergies: Simvastatin  Medications: Prior to Admission medications   Medication Sig Start Date End Date Taking? Authorizing Provider  acetaminophen (TYLENOL) 500 MG tablet Take 1 tablet (500 mg total) by mouth 2 (two) times daily. 08/15/14  Yes [name], MD  benazepril (LOTENSIN) 40 MG tablet Take 1 tablet (40 mg total) by mouth daily. 08/23/15  Yes [name], MD  calcium-vitamin D (OSCAL WITH D) 500-200 MG-UNIT per tablet Take 1 tablet by mouth 2 (two) times daily. 08/04/13  Yes [name], MD  ergocalciferol (VITAMIN D2) 50000 UNITS capsule Take 50,000 Units by mouth once a week.   Yes Historical Provider, MD  FLUoxetine (PROZAC) 10 MG capsule TAKE (1)  CAPSULE BY MOUTH ONCE DAILY. 08/23/15  Yes [name], MD  hydrochlorothiazide (HYDRODIURIL) 25 MG tablet Take 1 tablet (25 mg total) by mouth daily. 08/23/15  Yes [name], MD  lamoTRIgine (LAMICTAL) 100 MG tablet Take 100 mg by mouth daily.   Yes Historical Provider, MD  levETIRAcetam (KEPPRA) 750 MG tablet Take 1,500 mg by mouth 2 (two) times daily.    Yes Historical Provider, MD  levothyroxine (SYNTHROID, LEVOTHROID) 112 MCG tablet TAKE 1 TABLET BY MOUTH DAILY BEFORE BREAKFAST. 08/20/15  Yes Cassandria Anger, MD  loratadine (CLARITIN) 10 MG tablet Take 1 tablet (10 mg total) by mouth daily. 08/23/15  Yes [name], MD  omeprazole (PRILOSEC) 20 MG capsule TAKE 2 CAPSULES BY MOUTH ONCE DAILY. 08/23/15  Yes [name], MD  rosuvastatin (CRESTOR) 20 MG tablet Take 1 tablet (20 mg total) by mouth daily. 08/23/15  Yes [name], MD     Family History  Problem Relation Age of Onset  . Diabetes Mother     Social History   Social History  . Marital Status: Single    Spouse Name: N/A  . Number of Children: 2  . Years of Education: N/A   Occupational History  . disabled     Social History Main Topics  . Smoking status: Never Smoker   . Smokeless tobacco: Never Used  . Alcohol Use: No  . Drug Use: No  . Sexual Activity: No   Other Topics  Concern  . None   Social History Narrative    Review of Systems: A 12 point ROS discussed and pertinent positives are indicated in the HPI above.  All other systems are negative.  Review of Systems  Constitutional: Negative for fever, activity change, appetite change and fatigue.  HENT: Negative for sore throat and trouble swallowing.   Respiratory: Negative for shortness of breath.   Neurological: Negative for weakness.  Psychiatric/Behavioral: Negative for behavioral problems and confusion.    Vital Signs: BP 137/80 mmHg  Pulse 63  Temp(Src) 98.1 F (36.7 C) (Oral)  Resp 16  Ht 5\' 5"  (1.651 m)   Wt 240 lb (108.863 kg)  BMI 39.94 kg/m2  SpO2 100%  Physical Exam  Constitutional: She is oriented to person, place, and time.  Cardiovascular: Normal rate, regular rhythm and normal heart sounds.   Pulmonary/Chest: Effort normal and breath sounds normal. She has no wheezes.  Abdominal: Soft. Bowel sounds are normal. There is no tenderness.  Musculoskeletal: Normal range of motion.  Neurological: She is alert and oriented to person, place, and time.  Skin: Skin is warm and dry.  Psychiatric: She has a normal mood and affect. Her behavior is normal. Judgment and thought content normal.  Nursing note and vitals reviewed.   Mallampati Score:  MD Evaluation Airway: WNL Heart: WNL Abdomen: WNL Chest/ Lungs: WNL ASA  Classification: 2 Mallampati/Airway Score: Two  Imaging: US Soft Tissue Head/neck  08/16/2015  CLINICAL DATA:  Follow-up thyroid cancer.  Total thyroidectomy. EXAM: THYROID ULTRASOUND TECHNIQUE: Ultrasound examination of the thyroid gland and adjacent soft tissues was performed. COMPARISON:  None. FINDINGS: Right thyroid lobe Absent tissue Left thyroid lobe Absent tissue Isthmus Absent tissue Lymphadenopathy Multiple left upper cervical lymph nodes are identified. The largest has a short axis diameter of 0.93 cm. This is borderline enlarged. IMPRESSION: Borderline left cervical adenopathy is present. Recurrence of malignancy cannot be excluded. Electronically Signed   By: Marybelle Killings M.D.   On: 08/16/2015 13:57    Labs:  CBC:  Recent Labs  05/23/15 1605 08/31/15 1216  WBC 4.4 4.1  HGB 12.0 11.8*  HCT 37.0 36.2  PLT 233 182    COAGS:  Recent Labs  08/31/15 1216  INR 1.07    BMP:  Recent Labs  05/23/15 1605 08/23/15 1000 08/31/15 1216  NA 141 139 139  K 4.0 4.6 4.4  CL 101 102 111  CO2 26 25 21*  GLUCOSE 89 88 84  BUN 26* 29* 30*  CALCIUM 9.4 10.0 9.9  CREATININE 1.50* 1.46* 1.50*  GFRNONAA 37* 39* 36*  GFRAA 43* 44* 42*    LIVER  FUNCTION TESTS:  Recent Labs  05/23/15 1605 08/23/15 1000  BILITOT 0.5 0.5  AST 17 20  ALT 9 14  ALKPHOS 108 122  PROT 7.8 8.0  ALBUMIN 4.0 4.0    TUMOR MARKERS: No results for input(s): AFPTM, CEA, CA199, CHROMGRNA in the last 8760 hours.  Assessment and Plan:  Hx thyroid cancer with thyroidectomy and radiation 2014 Follow up reveals enlarged L cervical LN Now for bx Risks and Benefits discussed with the patient including, but not limited to bleeding, infection, damage to adjacent structures or low yield requiring additional tests. All of the patient's questions were answered, patient is agreeable to proceed. Consent signed and in chart.   Thank you for this interesting consult.  I greatly enjoyed meeting [name] and look forward to participating in their care.  A copy of this report  was sent to the requesting provider on this date.  Electronically Signed: Monia Sabal A 08/31/2015, 1:31 PM   I spent a total of  30 Minutes   in face to face in clinical consultation, greater than 50% of which was counseling/coordinating care for left cervical lymph node biopsy

## 2015-09-11 ENCOUNTER — Encounter: Payer: Medicaid Other | Admitting: Family Medicine

## 2015-09-13 ENCOUNTER — Encounter: Payer: Self-pay | Admitting: "Endocrinology

## 2015-09-13 ENCOUNTER — Ambulatory Visit (INDEPENDENT_AMBULATORY_CARE_PROVIDER_SITE_OTHER): Payer: Medicaid Other | Admitting: "Endocrinology

## 2015-09-13 VITALS — BP 118/82 | HR 68 | Resp 18 | Ht 65.0 in | Wt 241.0 lb

## 2015-09-13 DIAGNOSIS — E890 Postprocedural hypothyroidism: Secondary | ICD-10-CM

## 2015-09-13 DIAGNOSIS — C73 Malignant neoplasm of thyroid gland: Secondary | ICD-10-CM | POA: Diagnosis not present

## 2015-09-13 NOTE — Progress Notes (Signed)
Subjective:    Patient ID: [name], female    DOB: 02/20/1956,    Past Medical History  Diagnosis Date  . Obesity   . Depression   . Mental retardation, mild (I.Q. 50-70)   . Hyperlipidemia   . Seizure disorder ([place])   . Hypertension   . Thyroid cancer ([place])   . Seizure ([place])     11/17/2012 "still having them; "I believe their from a tumor in my head" ((11/17/2012   Past Surgical History  Procedure Laterality Date  . Breast lumpectomy  1996    right   . Total thyroidectomy Bilateral 11/17/2012  . Abdominal hysterectomy  2001  . Tubal ligation    . Thyroidectomy Bilateral 11/17/2012    Procedure: TOTAL THYROIDECTOMY;  Surgeon: Ascencion Dike, MD;  Location: [facility] OR;  Service: ENT;  Laterality: Bilateral;   Social History   Social History  . Marital Status: Single    Spouse Name: N/A  . Number of Children: 2  . Years of Education: N/A   Occupational History  . disabled     Social History Main Topics  . Smoking status: Never Smoker   . Smokeless tobacco: Never Used  . Alcohol Use: No  . Drug Use: No  . Sexual Activity: No   Other Topics Concern  . None   Social History Narrative   Outpatient Encounter Prescriptions as of 09/13/2015  Medication Sig  . acetaminophen (TYLENOL) 500 MG tablet Take 1 tablet (500 mg total) by mouth 2 (two) times daily.  . benazepril (LOTENSIN) 40 MG tablet Take 1 tablet (40 mg total) by mouth daily.  . calcium-vitamin D (OSCAL WITH D) 500-200 MG-UNIT per tablet Take 1 tablet by mouth 2 (two) times daily.  . ergocalciferol (VITAMIN D2) 50000 UNITS capsule Take 50,000 Units by mouth once a week.  Marland Kitchen FLUoxetine (PROZAC) 10 MG capsule TAKE (1) CAPSULE BY MOUTH ONCE DAILY.  . hydrochlorothiazide (HYDRODIURIL) 25 MG tablet Take 1 tablet (25 mg total) by mouth daily.  Marland Kitchen lamoTRIgine (LAMICTAL) 100 MG tablet Take 100 mg by mouth daily.  Marland Kitchen levETIRAcetam (KEPPRA) 750 MG tablet Take 1,500 mg by mouth 2 (two) times daily.   Marland Kitchen levothyroxine  (SYNTHROID, LEVOTHROID) 112 MCG tablet TAKE 1 TABLET BY MOUTH DAILY BEFORE BREAKFAST.  Marland Kitchen loratadine (CLARITIN) 10 MG tablet Take 1 tablet (10 mg total) by mouth daily.  Marland Kitchen omeprazole (PRILOSEC) 20 MG capsule TAKE 2 CAPSULES BY MOUTH ONCE DAILY.  . rosuvastatin (CRESTOR) 20 MG tablet Take 1 tablet (20 mg total) by mouth daily.   No facility-administered encounter medications on file as of 09/13/2015.   ALLERGIES: Allergies  Allergen Reactions  . Simvastatin Other (See Comments)    Muscle aches   VACCINATION STATUS: Immunization History  Administered Date(s) Administered  . H1N1 12/16/2007  . Influenza Split 11/24/2011  . Influenza Whole 11/30/2006, 12/07/2008, 12/26/2009  . Influenza,inj,Quad PF,36+ Mos 11/18/2012, 12/06/2013, 05/23/2015  . Pneumococcal Conjugate-13 04/03/2014  . Td 08/22/2003    HPI  60 yr old female with medical hx as above . she is here to f/u for post surgical hypothyroidism and for f/u of  thyroid cancer.  She is s/p Thyrogen stimulated remnant ablation with negative post therapy WBS. 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 She is now on Synthroid 112 g by mouth every morning.  - An attempt to biopsy the left cervical 0.9 cm lymph  node was abundant because " no evidence of adenopathy" -She is compliant  to her thyroid hormone ,denies any new complaints.   she denies palpitations. she denies dysphagia, SOB, nor voice change. she denies family hx of thyroid cancer.  Review of Systems  Constitutional: Positive for fatigue. Negative for unexpected weight change.  HENT: Negative for trouble swallowing and voice change.   Eyes: Negative for visual disturbance.  Respiratory: Negative for cough, shortness of breath and wheezing.   Cardiovascular: Negative for chest pain, palpitations and leg swelling.  Gastrointestinal: Negative for nausea, vomiting and diarrhea.   Endocrine: Negative for cold intolerance, heat intolerance, polydipsia, polyphagia and polyuria.  Musculoskeletal: Negative for myalgias and arthralgias.  Skin: Negative for color change, pallor, rash and wound.  Neurological: Negative for seizures and headaches.  Psychiatric/Behavioral: Negative for suicidal ideas and confusion.    Objective:    BP 118/82 mmHg  Pulse 68  Resp 18  Ht 5\' 5"  (1.651 m)  Wt 241 lb (109.317 kg)  BMI 40.10 kg/m2  SpO2 98%  Wt Readings from Last 3 Encounters:  09/13/15 241 lb (109.317 kg)  08/31/15 240 lb (108.863 kg)  08/23/15 240 lb (108.863 kg)    Physical Exam  Constitutional: She is oriented to person, place, and time. She appears well-developed.  HENT:  Head: Normocephalic and atraumatic.  Eyes: EOM are normal.  Neck: Normal range of motion. Neck supple. No tracheal deviation present.  Post thyroidectomy scar, no gross mass lesion on PE.  Cardiovascular: Normal rate and regular rhythm.   Pulmonary/Chest: Effort normal and breath sounds normal.  Abdominal: Soft. Bowel sounds are normal. There is no tenderness. There is no guarding.  Musculoskeletal: Normal range of motion. She exhibits no edema.  Neurological: She is alert and oriented to person, place, and time. She has normal reflexes. No cranial nerve deficit. Coordination normal.  Skin: Skin is warm and dry. No rash noted. No erythema. No pallor.  Psychiatric: She has a normal mood and affect. Judgment normal.    Results for orders placed or performed during the hospital encounter of [id number]  Basic metabolic panel  Result Value Ref Range   Sodium 139 135 - 145 mmol/L   Potassium 4.4 3.5 - 5.1 mmol/L   Chloride 111 101 - 111 mmol/L   CO2 21 (L) 22 - 32 mmol/L   Glucose, Bld 84 65 - 99 mg/dL   BUN 30 (H) 6 - 20 mg/dL   Creatinine, Ser 1.50 (H) 0.44 - 1.00 mg/dL   Calcium 9.9 8.9 - 10.3 mg/dL   GFR calc non Af Amer 36 (L) >60 mL/min   GFR calc Af Amer 42 (L) >60 mL/min   Anion gap  7 5 - 15  CBC with Differential/Platelet  Result Value Ref Range   WBC 4.1 4.0 - 10.5 K/uL   RBC 4.26 3.87 - 5.11 MIL/uL   Hemoglobin 11.8 (L) 12.0 - 15.0 g/dL   HCT 36.2 36.0 - 46.0 %   MCV 85.0 78.0 - 100.0 fL   MCH 27.7 26.0 - 34.0 pg   MCHC 32.6 30.0 - 36.0 g/dL   RDW 13.4 11.5 - 15.5 %   Platelets 182 150 - 400 K/uL   Neutrophils Relative % 54 %   Neutro Abs 2.2 1.7 - 7.7 K/uL   Lymphocytes Relative 37 %   Lymphs Abs 1.5 0.7 - 4.0 K/uL   Monocytes Relative 7 %   Monocytes Absolute 0.3 0.1 - 1.0 K/uL   Eosinophils Relative 1 %  Eosinophils Absolute 0.0 0.0 - 0.7 K/uL   Basophils Relative 1 %   Basophils Absolute 0.0 0.0 - 0.1 K/uL  Protime-INR  Result Value Ref Range   Prothrombin Time 14.1 11.6 - 15.2 seconds   INR 1.07 0.00 - 1.49   Complete Blood Count (Most recent): Lab Results  Component Value Date   WBC 4.1 08/31/2015   HGB 11.8* 08/31/2015   HCT 36.2 08/31/2015   MCV 85.0 08/31/2015   PLT 182 08/31/2015   Chemistry (most recent): Lab Results  Component Value Date   NA 139 08/31/2015   K 4.4 08/31/2015   CL 111 08/31/2015   CO2 21* 08/31/2015   BUN 30* 08/31/2015   CREATININE 1.50* 08/31/2015   Diabetic Labs (most recent): Lab Results  Component Value Date   HGBA1C 5.3 08/10/2012   Lipid Panel     Component Value Date/Time   CHOL 191 08/23/2015 1000   TRIG 69 08/23/2015 1000   HDL 53 08/23/2015 1000   CHOLHDL 3.6 08/23/2015 1000   VLDL 14 08/23/2015 1000   LDLCALC 124 08/23/2015 1000       Assessment & Plan:   1. Hypothyroidism, postsurgical -Based on her most recent thyroid function tests, her doses need adjustment. I will continue Synthroid  112 g by mouth every morning.   - We discussed about correct intake of levothyroxine, at fasting, with water, separated by at least 30 minutes from breakfast, and separated by more than 4 hours from calcium, iron, multivitamins, acid reflux medications (PPIs). -Patient is made aware of the fact  that thyroid hormone replacement is needed for life, dose to be adjusted by periodic monitoring of thyroid function tests.   2. Papillary thyroid carcinoma ([place]) Her  surveillance neck /thyroid u/s from 11/17/2013 was c/w surgically absent thyroid, however there is a 0.9 cm left cervical lymph node. See below.  Pt is s/p thyrogen stimulated thyroid remnant ablation with WBS on 01/14/2013 with no evidence of iodine avid metastasis. She has had pT2,Nx,Mx FVPTC, s/p near total thyroidectomy on September 24,2014. She is counseled to maintain high degree of compliance with synthroid therapy with aim of keeping TSH low normal ( 0.1-0.5).  -she is advised on the necessity of follow-ups and imaging studies at least one annually for the next 5 years. - She was sent for core biopsy of 0.9 cm left cervical lymph node last visit. However, this procedure was abundant due to no evidence of adenopathy. -I will obtained repeat surveillance thyroid/neck ultrasound in 3 months.  3. Essential hypertension - uncontrolled patient is advised to continue Benazepril  20 mg by mouth daily and hydrochlorothiazide 25 mg by mouth daily.  4. Obesity, Class II, BMI 35.0-39.9, with comorbidity (see actual BMI) ([place]) Patient is provided with weight loss advice- limit carbohydrate intake,  increase protein intake in exercise.  Follow up plan: Return in about 4 months (around 01/14/2016) for Thyroid / Neck Ultrasound, follow up with pre-visit labs.  Glade Lloyd, MD Phone: [phone]  Fax: [phone]   09/13/2015, 1:08 PM

## 2015-12-31 DIAGNOSIS — R569 Unspecified convulsions: Secondary | ICD-10-CM

## 2015-12-31 DIAGNOSIS — I10 Essential (primary) hypertension: Secondary | ICD-10-CM

## 2015-12-31 DIAGNOSIS — F78 Other intellectual disabilities: Secondary | ICD-10-CM

## 2016-01-02 ENCOUNTER — Telehealth: Payer: Self-pay

## 2016-01-02 NOTE — Telephone Encounter (Signed)
Per message left at front desk, Pam from [facility] called.   Returned call and Pam states that [name] u/s which is scheduled for 01/03/16 needs PA.   Contacted [employer]. PA pending.   Notified pt that we need to cancel her thyroid u/s which is scheduled for tomorrow. PA is pending with medicaid. Once this is approved we will reschedule and notify her.   Case # [id number]

## 2016-01-03 ENCOUNTER — Ambulatory Visit (HOSPITAL_COMMUNITY): Admission: RE | Admit: 2016-01-03 | Payer: Medicaid Other | Source: Ambulatory Visit

## 2016-01-14 ENCOUNTER — Other Ambulatory Visit: Payer: Self-pay | Admitting: "Endocrinology

## 2016-01-14 LAB — TSH: TSH: 0.28 mIU/L — ABNORMAL LOW

## 2016-01-14 LAB — T4, FREE: Free T4: 1.4 ng/dL (ref 0.8–1.8)

## 2016-01-21 ENCOUNTER — Encounter: Payer: Self-pay | Admitting: "Endocrinology

## 2016-01-21 ENCOUNTER — Ambulatory Visit (INDEPENDENT_AMBULATORY_CARE_PROVIDER_SITE_OTHER): Payer: Medicaid Other | Admitting: "Endocrinology

## 2016-01-21 VITALS — BP 130/76 | HR 101 | Ht 65.0 in | Wt 246.0 lb

## 2016-01-21 DIAGNOSIS — C73 Malignant neoplasm of thyroid gland: Secondary | ICD-10-CM | POA: Diagnosis not present

## 2016-01-21 DIAGNOSIS — E890 Postprocedural hypothyroidism: Secondary | ICD-10-CM | POA: Diagnosis not present

## 2016-01-21 NOTE — Progress Notes (Signed)
Subjective:    Patient ID: [name], female    DOB: [birth year],    Past Medical History:  Diagnosis Date  . Depression   . Hyperlipidemia   . Hypertension   . Mental retardation, mild (I.Q. 50-70)   . Obesity   . Seizure ([place])    11/17/2012 "still having them; "I believe their from a tumor in my head" ((11/17/2012  . Seizure disorder ([place])   . Thyroid cancer [facility])    Past Surgical History:  Procedure Laterality Date  . ABDOMINAL HYSTERECTOMY  2001  . BREAST LUMPECTOMY  1996   right   . THYROIDECTOMY Bilateral 11/17/2012   Procedure: TOTAL THYROIDECTOMY;  Surgeon: Ascencion Dike, MD;  Location: [place];  Service: ENT;  Laterality: Bilateral;  . TOTAL THYROIDECTOMY Bilateral 11/17/2012  . TUBAL LIGATION     Social History   Social History  . Marital status: Single    Spouse name: N/A  . Number of children: 2  . Years of education: N/A   Occupational History  . disabled     Social History Main Topics  . Smoking status: Never Smoker  . Smokeless tobacco: Never Used  . Alcohol use No  . Drug use: No  . Sexual activity: No   Other Topics Concern  . None   Social History Narrative  . None   Outpatient Encounter Prescriptions as of 01/21/2016  Medication Sig  . acetaminophen (TYLENOL) 500 MG tablet Take 1 tablet (500 mg total) by mouth 2 (two) times daily.  . benazepril (LOTENSIN) 40 MG tablet Take 1 tablet (40 mg total) by mouth daily.  . calcium-vitamin D (OSCAL WITH D) 500-200 MG-UNIT per tablet Take 1 tablet by mouth 2 (two) times daily.  . ergocalciferol (VITAMIN D2) 50000 UNITS capsule Take 50,000 Units by mouth once a week.  Marland Kitchen FLUoxetine (PROZAC) 10 MG capsule TAKE (1) CAPSULE BY MOUTH ONCE DAILY.  . hydrochlorothiazide (HYDRODIURIL) 25 MG tablet Take 1 tablet (25 mg total) by mouth daily.  Marland Kitchen lamoTRIgine (LAMICTAL) 100 MG tablet Take 100 mg by mouth daily.  Marland Kitchen levETIRAcetam (KEPPRA) 750 MG tablet Take 1,500 mg by mouth 2 (two) times daily.   Marland Kitchen  levothyroxine (SYNTHROID, LEVOTHROID) 112 MCG tablet TAKE 1 TABLET BY MOUTH DAILY BEFORE BREAKFAST.  Marland Kitchen loratadine (CLARITIN) 10 MG tablet Take 1 tablet (10 mg total) by mouth daily.  Marland Kitchen omeprazole (PRILOSEC) 20 MG capsule TAKE 2 CAPSULES BY MOUTH ONCE DAILY.  . rosuvastatin (CRESTOR) 20 MG tablet Take 1 tablet (20 mg total) by mouth daily.   No facility-administered encounter medications on file as of 01/21/2016.    ALLERGIES: Allergies  Allergen Reactions  . Simvastatin Other (See Comments)    Muscle aches   VACCINATION STATUS: Immunization History  Administered Date(s) Administered  . H1N1 12/16/2007  . Influenza Split 11/24/2011  . Influenza Whole 11/30/2006, 12/07/2008, 12/26/2009  . Influenza,inj,Quad PF,36+ Mos 11/18/2012, 12/06/2013, 05/23/2015  . Pneumococcal Conjugate-13 04/03/2014  . Td 08/22/2003    HPI  60 yr old female with medical hx as above . she is here to f/u for post surgical hypothyroidism and for f/u of  thyroid cancer. 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 She has had  Thyrogen stimulated remnant ablation with negative post therapy WBS in 2014.  She is now on Synthroid 112 g by mouth every morning.  - An attempt to biopsy the left cervical  0.9 cm lymph node was abandoned because " no evidence of adenopathy" -She is compliant  to her thyroid hormone ,denies any new complaints.  - Medicaid denied coverage for surveillance thyroid /neck ultrasound. she denies palpitations. she denies dysphagia, SOB, nor voice change. she denies family hx of thyroid cancer.  Review of Systems  Constitutional: Positive for fatigue. Negative for unexpected weight change.  HENT: Negative for trouble swallowing and voice change.   Eyes: Negative for visual disturbance.  Respiratory: Negative for cough, shortness of breath and wheezing.   Cardiovascular: Negative for chest pain,  palpitations and leg swelling.  Gastrointestinal: Negative for diarrhea, nausea and vomiting.  Endocrine: Negative for cold intolerance, heat intolerance, polydipsia, polyphagia and polyuria.  Musculoskeletal: Negative for arthralgias and myalgias.  Skin: Negative for color change, pallor, rash and wound.  Neurological: Negative for seizures and headaches.  Psychiatric/Behavioral: Negative for confusion and suicidal ideas.    Objective:    BP 130/76   Pulse (!) 101   Ht 5\' 5"  (1.651 m)   Wt 246 lb (111.6 kg)   BMI 40.94 kg/m   Wt Readings from Last 3 Encounters:  01/21/16 246 lb (111.6 kg)  09/13/15 241 lb (109.3 kg)  08/31/15 240 lb (108.9 kg)    Physical Exam  Constitutional: She is oriented to person, place, and time. She appears well-developed.  HENT:  Head: Normocephalic and atraumatic.  Eyes: EOM are normal.  Neck: Normal range of motion. Neck supple. No tracheal deviation present.  Post thyroidectomy scar, no gross mass lesion on PE.  Cardiovascular: Normal rate and regular rhythm.   Pulmonary/Chest: Effort normal and breath sounds normal.  Abdominal: Soft. Bowel sounds are normal. There is no tenderness. There is no guarding.  Musculoskeletal: Normal range of motion. She exhibits no edema.  Neurological: She is alert and oriented to person, place, and time. She has normal reflexes. No cranial nerve deficit. Coordination normal.  Skin: Skin is warm and dry. No rash noted. No erythema. No pallor.  Psychiatric: She has a normal mood and affect. Judgment normal.    Results for orders placed or performed in visit on 01/14/16  TSH  Result Value Ref Range   TSH 0.28 (L) mIU/L  T4, free  Result Value Ref Range   Free T4 1.4 0.8 - 1.8 ng/dL   Complete Blood Count (Most recent): Lab Results  Component Value Date   WBC 4.1 08/31/2015   HGB 11.8 (L) 08/31/2015   HCT 36.2 08/31/2015   MCV 85.0 08/31/2015   PLT 182 08/31/2015   Chemistry (most recent): Lab Results   Component Value Date   NA 139 08/31/2015   K 4.4 08/31/2015   CL 111 08/31/2015   CO2 21 (L) 08/31/2015   BUN 30 (H) 08/31/2015   CREATININE 1.50 (H) 08/31/2015   Diabetic Labs (most recent): Lab Results  Component Value Date   HGBA1C 5.3 08/10/2012   Lipid Panel     Component Value Date/Time   CHOL 191 08/23/2015 1000   TRIG 69 08/23/2015 1000   HDL 53 08/23/2015 1000   CHOLHDL 3.6 08/23/2015 1000   VLDL 14 08/23/2015 1000   LDLCALC 124 08/23/2015 1000       Assessment & Plan:   1. Hypothyroidism, postsurgical -Based on her most recent thyroid function tests, her doses need adjustment. I will continue Synthroid  112 g by mouth every morning.   - We discussed about correct intake of levothyroxine, at fasting, with water, separated by at least  30 minutes from breakfast, and separated by more than 4 hours from calcium, iron, multivitamins, acid reflux medications (PPIs). -Patient is made aware of the fact that thyroid hormone replacement is needed for life, dose to be adjusted by periodic monitoring of thyroid function tests.   2. Papillary thyroid carcinoma ([place]) Her  surveillance neck /thyroid u/s from 11/17/2013 was c/w surgically absent thyroid, however there is a 0.9 cm left cervical lymph node. See below.  Pt is s/p thyrogen stimulated thyroid remnant ablation with WBS on 01/14/2013 with no evidence of iodine avid metastasis. She has had pT2,Nx,Mx FVPTC, s/p near total thyroidectomy on September 24,2014. She is counseled to maintain high degree of compliance with synthroid therapy with aim of keeping TSH low normal ( 0.1-0.5).  -she is advised on the necessity of follow-ups and imaging studies at least one annually for the next 5 years. - She was sent for core biopsy of 0.9 cm left cervical lymph node last visit. However, this procedure was abandoned due to " no evidence of adenopathy".  The previously noticed  left cervical lymph node was sent to decrease in size to  0.5 cm. Medicaid did not cover surveillance ultrasound. -It is time for Thyrogen stimulated whole-body scan. - I will proceed with a plan to obtain thyroid Thyrogen estimated whole-body scan with thyroglobulin levels.  3. Essential hypertension - Controlled, patient is advised to continue Benazepril  20 mg by mouth daily and hydrochlorothiazide 25 mg by mouth daily.  4. Obesity, Class II, BMI 35.0-39.9, with comorbidity (see actual BMI) ([place]) Patient is provided with weight loss advice- limit carbohydrate intake,  increase protein intake in exercise.  Follow up plan: Return in about 3 months (around 04/22/2016) for Whole Body Scan w/Thyrogen, follow up with pre-visit labs.  Glade Lloyd, MD Phone: [phone]  Fax: [phone]   01/21/2016, 10:23 AM

## 2016-01-29 ENCOUNTER — Ambulatory Visit (INDEPENDENT_AMBULATORY_CARE_PROVIDER_SITE_OTHER): Payer: Medicaid Other | Admitting: Family Medicine

## 2016-01-29 ENCOUNTER — Other Ambulatory Visit (HOSPITAL_COMMUNITY)
Admission: RE | Admit: 2016-01-29 | Discharge: 2016-01-29 | Disposition: A | Discharge status: Home / Self Care | Payer: Medicaid Other | Source: Ambulatory Visit | Attending: Family Medicine | Admitting: Family Medicine

## 2016-01-29 ENCOUNTER — Encounter: Payer: Self-pay | Admitting: Family Medicine

## 2016-01-29 VITALS — BP 140/80 | HR 76 | Resp 18 | Ht 65.0 in | Wt 246.1 lb

## 2016-01-29 DIAGNOSIS — Z0000 Encounter for general adult medical examination without abnormal findings: Secondary | ICD-10-CM

## 2016-01-29 DIAGNOSIS — E559 Vitamin D deficiency, unspecified: Secondary | ICD-10-CM | POA: Diagnosis not present

## 2016-01-29 DIAGNOSIS — Z124 Encounter for screening for malignant neoplasm of cervix: Secondary | ICD-10-CM

## 2016-01-29 DIAGNOSIS — I10 Essential (primary) hypertension: Secondary | ICD-10-CM

## 2016-01-29 DIAGNOSIS — Z01419 Encounter for gynecological examination (general) (routine) without abnormal findings: Secondary | ICD-10-CM | POA: Diagnosis not present

## 2016-01-29 DIAGNOSIS — Z23 Encounter for immunization: Secondary | ICD-10-CM

## 2016-01-29 DIAGNOSIS — Z1211 Encounter for screening for malignant neoplasm of colon: Secondary | ICD-10-CM

## 2016-01-29 DIAGNOSIS — E785 Hyperlipidemia, unspecified: Secondary | ICD-10-CM

## 2016-01-29 LAB — COMPREHENSIVE METABOLIC PANEL
ALT: 12 U/L (ref 6–29)
AST: 17 U/L (ref 10–35)
Albumin: 4 g/dL (ref 3.6–5.1)
Alkaline Phosphatase: 109 U/L (ref 33–130)
BUN: 28 mg/dL — ABNORMAL HIGH (ref 7–25)
CO2: 26 mmol/L (ref 20–31)
Calcium: 9.4 mg/dL (ref 8.6–10.4)
Chloride: 105 mmol/L (ref 98–110)
Creat: 1.71 mg/dL — ABNORMAL HIGH (ref 0.50–0.99)
Glucose, Bld: 86 mg/dL (ref 65–99)
Potassium: 4.1 mmol/L (ref 3.5–5.3)
Sodium: 141 mmol/L (ref 135–146)
Total Bilirubin: 0.5 mg/dL (ref 0.2–1.2)
Total Protein: 7.7 g/dL (ref 6.1–8.1)

## 2016-01-29 LAB — LIPID PANEL
Cholesterol: 187 mg/dL (ref <200)
HDL: 50 mg/dL — ABNORMAL LOW (ref >50)
LDL Cholesterol: 123 mg/dL — ABNORMAL HIGH (ref <100)
Total CHOL/HDL Ratio: 3.7 Ratio (ref <5.0)
Triglycerides: 72 mg/dL (ref <150)
VLDL: 14 mg/dL (ref <30)

## 2016-01-29 LAB — POC HEMOCCULT BLD/STL (OFFICE/1-CARD/DIAGNOSTIC): Fecal Occult Blood, POC: NEGATIVE

## 2016-01-29 NOTE — Patient Instructions (Signed)
F/u in 5 months, call if you need me before  Flu vaccine today  Labs today  You need to get appt for colonoscopy, you were referred since July, pls speak with referral clerk at checkout about this  Please work on good  health habits so that your health will improve. 1. Commitment to daily physical activity for 30 to 60  minutes, if you are able to do this.  2. Commitment to wise food choices. Aim for half of your  food intake to be vegetable and fruit, one quarter starchy foods, and one quarter protein. Try to eat on a regular schedule  3 meals per day, snacking between meals should be limited to vegetables or fruits or small portions of nuts. 64 ounces of water per day is generally recommended, unless you have specific health conditions, like heart failure or kidney failure where you will need to limit fluid intake.  3. Commitment to sufficient and a  good quality of physical and mental rest daily, generally between 6 to 8 hours per day.  WITH PERSISTANCE AND PERSEVERANCE, THE IMPOSSIBLE , BECOMES THE NORM!   Thank you  for choosing [facility]. We consider it a privelige to serve you.  Delivering excellent health care in a caring and  compassionate way is our goal.  Partnering with you,  so that together we can achieve this goal is our strategy.

## 2016-01-29 NOTE — Assessment & Plan Note (Signed)
After obtaining informed consent, the vaccine is  administered by LPN.  

## 2016-01-29 NOTE — Assessment & Plan Note (Signed)
Annual exam as documented. Counseling done  re healthy lifestyle involving commitment to 150 minutes exercise per week, heart healthy diet, and attaining healthy weight.The importance of adequate sleep also discussed.  Immunization and cancer screening needs are specifically addressed at this visit.  

## 2016-01-29 NOTE — Progress Notes (Signed)
    [name]     MRN: [id number]      DOB: [birth year]  HPI: Patient is in for annual physical exam. No other health concerns are expressed or addressed at the visit. Recent labs, if available are reviewed. Immunization is reviewed , and  updated if needed.   PE: Pleasant  female, alert and oriented x 3, in no cardio-pulmonary distress. Afebrile. HEENT No facial trauma or asymetry. Sinuses non tender.  Extra occullar muscles intact, pupils equally reactive to light. External ears normal, tympanic membranes clear. Oropharynx moist, no exudate. Neck: supple, no adenopathy,JVD or thyromegaly.No bruits.  Chest: Clear to ascultation bilaterally.No crackles or wheezes. Non tender to palpation  Breast: No asymetry,no masses or lumps. No tenderness. No nipple discharge or inversion. No axillary or supraclavicular adenopathy  Cardiovascular system; Heart sounds normal,  S1 and  S2 ,no S3.  No murmur, or thrill. Apical beat not displaced Peripheral pulses normal.  Abdomen: Soft, non tender, no organomegaly or masses. No bruits. Bowel sounds normal. No guarding, tenderness or rebound.  Rectal:  Normal sphincter tone. No rectal mass. Guaiac negative stool.  GU: External genitalia normal female genitalia , normal female distribution of hair. No lesions. Urethral meatus normal in size, no  Prolapse, no lesions visibly  Present. Bladder non tender. Vagina pink and moist , with no visible lesions , discharge present . Adequate pelvic support no  cystocele or rectocele noted Uterus absent, no adnexal masses, no  adnexal tenderness.   Musculoskeletal exam: Full ROM of spine, hips , shoulders and knees. No deformity ,swelling or crepitus noted. No muscle wasting or atrophy.   Neurologic: Cranial nerves 2 to 12 intact. Power, tone ,sensation and reflexes normal throughout. No disturbance in gait. No tremor.  Skin: Intact, no ulceration, erythema , scaling and dryness   Noted in lower extremities Pigmentation normal throughout  Psych; Normal mood and affect. Judgement and concentration normal   Assessment & Plan:  Annual physical exam Annual exam as documented. Counseling done  re healthy lifestyle involving commitment to 150 minutes exercise per week, heart healthy diet, and attaining healthy weight.The importance of adequate sleep also discussed.  Immunization and cancer screening needs are specifically addressed at this visit.   Need for prophylactic vaccination and inoculation against influenza After obtaining informed consent, the vaccine is  administered by LPN.

## 2016-01-29 NOTE — Progress Notes (Signed)
NOTED

## 2016-01-30 LAB — CYTOLOGY - PAP: Diagnosis: NEGATIVE

## 2016-01-30 LAB — VITAMIN D 25 HYDROXY (VIT D DEFICIENCY, FRACTURES): Vit D, 25-Hydroxy: 56 ng/mL (ref 30–100)

## 2016-02-28 ENCOUNTER — Emergency Department (HOSPITAL_COMMUNITY): Payer: Medicaid Other

## 2016-02-28 ENCOUNTER — Emergency Department (HOSPITAL_COMMUNITY)
Admission: EM | Admit: 2016-02-28 | Discharge: 2016-02-28 | Disposition: A | Discharge status: Home / Self Care | Payer: Medicaid Other | Attending: Emergency Medicine | Admitting: Emergency Medicine

## 2016-02-28 ENCOUNTER — Encounter (HOSPITAL_COMMUNITY): Payer: Self-pay | Admitting: Emergency Medicine

## 2016-02-28 DIAGNOSIS — E039 Hypothyroidism, unspecified: Secondary | ICD-10-CM | POA: Diagnosis not present

## 2016-02-28 DIAGNOSIS — R109 Unspecified abdominal pain: Secondary | ICD-10-CM

## 2016-02-28 DIAGNOSIS — Z79899 Other long term (current) drug therapy: Secondary | ICD-10-CM | POA: Insufficient documentation

## 2016-02-28 DIAGNOSIS — I10 Essential (primary) hypertension: Secondary | ICD-10-CM | POA: Insufficient documentation

## 2016-02-28 DIAGNOSIS — R197 Diarrhea, unspecified: Secondary | ICD-10-CM | POA: Insufficient documentation

## 2016-02-28 DIAGNOSIS — R112 Nausea with vomiting, unspecified: Secondary | ICD-10-CM

## 2016-02-28 DIAGNOSIS — N179 Acute kidney failure, unspecified: Secondary | ICD-10-CM | POA: Diagnosis not present

## 2016-02-28 LAB — URINALYSIS, ROUTINE W REFLEX MICROSCOPIC
Bilirubin Urine: NEGATIVE
Glucose, UA: NEGATIVE mg/dL
Hgb urine dipstick: NEGATIVE
Ketones, ur: NEGATIVE mg/dL
Nitrite: NEGATIVE
Protein, ur: 30 mg/dL — AB
Specific Gravity, Urine: 1.019 (ref 1.005–1.030)
pH: 5 (ref 5.0–8.0)

## 2016-02-28 LAB — COMPREHENSIVE METABOLIC PANEL
ALT: 19 U/L (ref 14–54)
AST: 32 U/L (ref 15–41)
Albumin: 3.9 g/dL (ref 3.5–5.0)
Alkaline Phosphatase: 96 U/L (ref 38–126)
Anion gap: 9 (ref 5–15)
BUN: 34 mg/dL — ABNORMAL HIGH (ref 6–20)
CO2: 26 mmol/L (ref 22–32)
Calcium: 9 mg/dL (ref 8.9–10.3)
Chloride: 106 mmol/L (ref 101–111)
Creatinine, Ser: 2.15 mg/dL — ABNORMAL HIGH (ref 0.44–1.00)
GFR calc Af Amer: 27 mL/min — ABNORMAL LOW (ref >60)
GFR calc non Af Amer: 24 mL/min — ABNORMAL LOW (ref >60)
Glucose, Bld: 93 mg/dL (ref 65–99)
Potassium: 2.8 mmol/L — ABNORMAL LOW (ref 3.5–5.1)
Sodium: 141 mmol/L (ref 135–145)
Total Bilirubin: 0.5 mg/dL (ref 0.3–1.2)
Total Protein: 8.1 g/dL (ref 6.5–8.1)

## 2016-02-28 LAB — CBC
HCT: 37.1 % (ref 36.0–46.0)
Hemoglobin: 12.1 g/dL (ref 12.0–15.0)
MCH: 28.2 pg (ref 26.0–34.0)
MCHC: 32.6 g/dL (ref 30.0–36.0)
MCV: 86.5 fL (ref 78.0–100.0)
Platelets: 218 10*3/uL (ref 150–400)
RBC: 4.29 MIL/uL (ref 3.87–5.11)
RDW: 14.2 % (ref 11.5–15.5)
WBC: 5.9 10*3/uL (ref 4.0–10.5)

## 2016-02-28 LAB — LIPASE, BLOOD: Lipase: 62 U/L — ABNORMAL HIGH (ref 11–51)

## 2016-02-28 MED ORDER — SODIUM CHLORIDE 0.9 % IV BOLUS (SEPSIS)
1000.0000 mL | Freq: Once | INTRAVENOUS | Status: AC
  Administered 2016-02-28: 1000 mL via INTRAVENOUS

## 2016-02-28 MED ORDER — POTASSIUM CHLORIDE CRYS ER 20 MEQ PO TBCR
40.0000 meq | EXTENDED_RELEASE_TABLET | Freq: Once | ORAL | Status: AC
  Administered 2016-02-28: 40 meq via ORAL
  Filled 2016-02-28: qty 2

## 2016-02-28 MED ORDER — LEVETIRACETAM 500 MG/5ML IV SOLN
INTRAVENOUS | Status: AC
  Filled 2016-02-28: qty 15

## 2016-02-28 MED ORDER — SODIUM CHLORIDE 0.9 % IV SOLN: 30.0000 meq | Freq: Once | INTRAVENOUS | Status: DC

## 2016-02-28 MED ORDER — IOPAMIDOL (ISOVUE-300) INJECTION 61%
INTRAVENOUS | Status: AC
  Administered 2016-02-28: 30 mL via ORAL
  Filled 2016-02-28: qty 30

## 2016-02-28 MED ORDER — POTASSIUM CHLORIDE 10 MEQ/100ML IV SOLN
10.0000 meq | INTRAVENOUS | Status: DC
  Administered 2016-02-28 (×2): 10 meq via INTRAVENOUS
  Filled 2016-02-28 (×2): qty 100

## 2016-02-28 MED ORDER — LEVETIRACETAM 500 MG/5ML IV SOLN
1500.0000 mg | Freq: Once | INTRAVENOUS | Status: AC
  Administered 2016-02-28: 1500 mg via INTRAVENOUS
  Filled 2016-02-28: qty 15

## 2016-02-28 MED ORDER — ONDANSETRON HCL 4 MG/2ML IJ SOLN
4.0000 mg | Freq: Once | INTRAMUSCULAR | Status: AC
  Administered 2016-02-28: 4 mg via INTRAVENOUS
  Filled 2016-02-28: qty 2

## 2016-02-28 MED ORDER — ONDANSETRON 8 MG PO TBDP: 8.0000 mg | ORAL_TABLET | Freq: Three times a day (TID) | ORAL | 0 refills | Status: DC | PRN

## 2016-02-28 NOTE — ED Notes (Signed)
IV infiltrated to right upper arm after Keppra started,  EDP Wentz notified of infiltration.  IV restarted to right shoulder

## 2016-02-28 NOTE — ED Triage Notes (Addendum)
Patient complaining of abdominal pain with vomiting and diarrhea x 6 days. Patient ambulatory with no assistance at triage. Mucous membranes moist at this time.

## 2016-02-28 NOTE — Discharge Instructions (Signed)
Use zofran for nausea. Drink gatorade mixed with water (1:1) and drink fluids every ten minutes. Recheck of kidney function with her doctor 1-2 days. Return if unable to keep down fluids or worsening abdominal pain.

## 2016-02-28 NOTE — ED Provider Notes (Signed)
[facility] DEPT Provider Note   CSN: [id number] Arrival date & time: 02/28/16  1455     History   Chief Complaint Chief Complaint  Patient presents with  . Abdominal Pain    HPI [name] is a 61 y.o. female.  She presents for evaluation of abdominal pain associated with nausea, vomiting, diarrhea, present for 6 days. She states she has been unable to tolerate her medicines, and has had one seizure several days ago. Her diarrhea has improved. She has not had any in the last several days. There's been no blood in the emesis or the diarrhea. There is been no fever, chills, cough, shortness of breath, weakness or dizziness. There are no other known modifying factors.  HPI  Past Medical History:  Diagnosis Date  . Depression   . Hyperlipidemia   . Hypertension   . Mental retardation, mild (I.Q. 50-70)   . Obesity   . Seizure ([place])    11/17/2012 "still having them; "I believe their from a tumor in my head" ((11/17/2012  . Seizure disorder ([place])   . Thyroid cancer [facility])     Patient Active Problem List   Diagnosis Date Noted  . GERD (gastroesophageal reflux disease) 08/23/2015  . Osteoarthritis of right knee 08/15/2014  . Need for prophylactic vaccination and inoculation against influenza 12/10/2013  . 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 Reduced vision 05/03/2013  . Papillary thyroid carcinoma ([place]) 04/12/2013  . Annual physical exam 03/30/2012  . Fracture of metatarsal 08/06/2010  . Vitamin D deficiency 08/16/2009  . POLYNEUROPATHY 04/10/2009  . Hyperlipidemia LDL goal <100 07/15/2007  . Obesity, Class II, BMI 35.0-39.9, with comorbidity (see actual BMI) 07/15/2007  . Anxiety and depression 07/15/2007  . MENTAL RETARDATION, MILD 07/15/2007  . Essential hypertension 07/15/2007  . Convulsions ([place]) 07/15/2007    Past Surgical History:  Procedure Laterality Date  . ABDOMINAL HYSTERECTOMY  2001  . BREAST LUMPECTOMY  1996   right   . THYROIDECTOMY  Bilateral 11/17/2012   Procedure: TOTAL THYROIDECTOMY;  Surgeon: Ascencion Dike, MD;  Location: [facility];  Service: ENT;  Laterality: Bilateral;  . TOTAL THYROIDECTOMY Bilateral 11/17/2012  . TUBAL LIGATION      OB History    No data available       Home Medications    Prior to Admission medications   Medication Sig Start Date End Date Taking? Authorizing Provider  acetaminophen (TYLENOL) 500 MG tablet Take 1 tablet (500 mg total) by mouth 2 (two) times daily. 08/15/14   [name], MD  benazepril (LOTENSIN) 40 MG tablet Take 1 tablet (40 mg total) by mouth daily. 08/23/15   [name], MD  calcium-vitamin D (OSCAL WITH D) 500-200 MG-UNIT per tablet Take 1 tablet by mouth 2 (two) times daily. 08/04/13   [name], MD  ergocalciferol (VITAMIN D2) 50000 UNITS capsule Take 50,000 Units by mouth once a week.    Historical Provider, MD  FLUoxetine (PROZAC) 10 MG capsule TAKE (1) CAPSULE BY MOUTH ONCE DAILY. 08/23/15   [name], MD  hydrochlorothiazide (HYDRODIURIL) 25 MG tablet Take 1 tablet (25 mg total) by mouth daily. 08/23/15   [name], MD  lamoTRIgine (LAMICTAL) 100 MG tablet Take 100 mg by mouth daily.    Historical Provider, MD  levETIRAcetam (KEPPRA) 750 MG tablet Take 1,500 mg by mouth 2 (two) times daily.     Historical Provider, MD  levothyroxine (SYNTHROID, LEVOTHROID) 112 MCG tablet TAKE 1 TABLET BY MOUTH DAILY  BEFORE BREAKFAST. 08/20/15   Cassandria Anger, MD  loratadine (CLARITIN) 10 MG tablet Take 1 tablet (10 mg total) by mouth daily. 08/23/15   [name], MD  omeprazole (PRILOSEC) 20 MG capsule TAKE 2 CAPSULES BY MOUTH ONCE DAILY. 08/23/15   [name], MD  rosuvastatin (CRESTOR) 20 MG tablet Take 1 tablet (20 mg total) by mouth daily. 08/23/15   [name], MD    Family History Family History  Problem Relation Age of Onset  . Diabetes Mother     Social History Social History  Substance Use Topics  . Smoking  status: Never Smoker  . Smokeless tobacco: Never Used  . Alcohol use No     Allergies   Simvastatin   Review of Systems Review of Systems  All other systems reviewed and are negative.    Physical Exam Updated Vital Signs BP 140/74 (BP Location: Left Arm)   Pulse 100   Temp 97.8 F (36.6 C) (Temporal)   Resp 18   Ht 5\' 5"  (1.651 m)   Wt 140 lb (63.5 kg)   SpO2 99%   BMI 23.30 kg/m   Physical Exam  Constitutional: She is oriented to person, place, and time. She appears well-developed. She appears distressed (She is uncomfortable).  Appears older than stated age.  HENT:  Head: Normocephalic and atraumatic.  Oral mucous membranes are somewhat dry.  Eyes: Conjunctivae and EOM are normal. Pupils are equal, round, and reactive to light.  Neck: Normal range of motion and phonation normal. Neck supple.  Cardiovascular: Normal rate and regular rhythm.   Pulmonary/Chest: Effort normal and breath sounds normal. She exhibits no tenderness.  Abdominal: Soft. She exhibits no distension and no mass. There is tenderness (Mild left upper and lower quadrant tenderness, mild). There is no rebound and no guarding. No hernia.  Musculoskeletal: Normal range of motion.  Neurological: She is alert and oriented to person, place, and time. She exhibits normal muscle tone.  Skin: Skin is warm and dry.  Psychiatric: She has a normal mood and affect. Her behavior is normal. Judgment and thought content normal.  Nursing note and vitals reviewed.    ED Treatments / Results  Labs (all labs ordered are listed, but only abnormal results are displayed) Labs Reviewed  LIPASE, BLOOD - Abnormal; Notable for the following:       Result Value   Lipase 62 (*)    All other components within normal limits  COMPREHENSIVE METABOLIC PANEL - Abnormal; Notable for the following:    Potassium 2.8 (*)    BUN 34 (*)    Creatinine, Ser 2.15 (*)    GFR calc non Af Amer 24 (*)    GFR calc Af Amer 27 (*)     All other components within normal limits  CBC  URINALYSIS, ROUTINE W REFLEX MICROSCOPIC    EKG  EKG Interpretation None       Radiology No results found.  Procedures Procedures (including critical care time)  Medications Ordered in ED Medications  potassium chloride SA (K-DUR,KLOR-CON) CR tablet 40 mEq (not administered)  sodium chloride 0.9 % bolus 1,000 mL (not administered)  ondansetron (ZOFRAN) injection 4 mg (not administered)  levETIRAcetam (KEPPRA) 1,500 mg in sodium chloride 0.9 % 100 mL IVPB (not administered)  potassium chloride 30 mEq in sodium chloride 0.9 % 265 mL (KCL MULTIRUN) IVPB (not administered)     Initial Impression / Assessment and Plan / ED Course  I have reviewed the triage  vital signs and the nursing notes.  Pertinent labs & imaging results that were available during my care of the patient were reviewed by me and considered in my medical decision making (see chart for details).  Clinical Course as of Feb 27 2045  Thu Feb 28, 2016  2037 High  Creatinine: (!) 2.15 [EW]  2037 low Potassium: (!) 2.8 [EW]  2037 high Lipase: (!) 62 [EW]    Clinical Course User Index [EW] Daleen Bo, MD    Medications  potassium chloride SA (K-DUR,KLOR-CON) CR tablet 40 mEq (not administered)  sodium chloride 0.9 % bolus 1,000 mL (not administered)  ondansetron (ZOFRAN) injection 4 mg (not administered)  levETIRAcetam (KEPPRA) 1,500 mg in sodium chloride 0.9 % 100 mL IVPB (not administered)  potassium chloride 30 mEq in sodium chloride 0.9 % 265 mL (KCL MULTIRUN) IVPB (not administered)    Patient Vitals for the past 24 hrs:  BP Temp Temp src Pulse Resp SpO2 Height Weight  02/28/16 1608 - - - - - - 5\' 5"  (1.651 m) 140 lb (63.5 kg)  02/28/16 1606 140/74 97.8 F (36.6 C) Temporal 100 18 99 % - -    CT ordered to evaluate for colitis vs. Obstruction  Care to Dr. Jeanell Sparrow- 22:05   Final Clinical Impressions(s) / ED Diagnoses   Final diagnoses:  Diarrhea   N&V (nausea and vomiting)  Abdominal pain    Nursing Notes Reviewed/ Care Coordinated Applicable Imaging Reviewed Interpretation of Laboratory Data incorporated into ED treatment   New Prescriptions New Prescriptions   No medications on file     Daleen Bo, MD 02/29/16 [phone]

## 2016-02-28 NOTE — ED Notes (Signed)
Attempted x 2 for IV access without success, asked CN to look for IV

## 2016-03-03 DIAGNOSIS — I10 Essential (primary) hypertension: Secondary | ICD-10-CM | POA: Diagnosis not present

## 2016-03-03 DIAGNOSIS — F78 Other intellectual disabilities: Secondary | ICD-10-CM | POA: Diagnosis not present

## 2016-03-03 DIAGNOSIS — R569 Unspecified convulsions: Secondary | ICD-10-CM | POA: Diagnosis not present

## 2016-03-05 ENCOUNTER — Telehealth: Payer: Self-pay

## 2016-03-05 ENCOUNTER — Encounter: Payer: Self-pay | Admitting: Family Medicine

## 2016-03-05 ENCOUNTER — Ambulatory Visit (INDEPENDENT_AMBULATORY_CARE_PROVIDER_SITE_OTHER): Payer: Medicaid Other | Admitting: Family Medicine

## 2016-03-05 VITALS — BP 138/80 | HR 100 | Temp 98.8°F | Resp 18 | Ht 65.0 in | Wt 244.0 lb

## 2016-03-05 DIAGNOSIS — N179 Acute kidney failure, unspecified: Secondary | ICD-10-CM | POA: Diagnosis not present

## 2016-03-05 DIAGNOSIS — R110 Nausea: Secondary | ICD-10-CM | POA: Diagnosis not present

## 2016-03-05 LAB — BASIC METABOLIC PANEL WITH GFR
BUN: 23 mg/dL (ref 7–25)
CO2: 26 mmol/L (ref 20–31)
Calcium: 9.6 mg/dL (ref 8.6–10.4)
Chloride: 104 mmol/L (ref 98–110)
Creat: 1.62 mg/dL — ABNORMAL HIGH (ref 0.50–0.99)
GFR, Est African American: 39 mL/min — ABNORMAL LOW (ref >=60)
GFR, Est Non African American: 34 mL/min — ABNORMAL LOW (ref >=60)
Glucose, Bld: 92 mg/dL (ref 65–99)
Potassium: 3.5 mmol/L (ref 3.5–5.3)
Sodium: 144 mmol/L (ref 135–146)

## 2016-03-05 NOTE — Progress Notes (Signed)
Chief Complaint  Patient presents with  . Follow-up    er fu [name]   Primary care doctor is [name], M.D. Patient was seen at the emergency room over the weekend for nausea vomiting and diarrhea. She was dehydrated. Her lab work was abnormal. She had acute kidney injury. She was given IV fluids and potassium replacement. She is seen today for follow-up and repeat laboratory testing. She states she thinks she had a "stomach flu". No one else in her household is sick. She states she is compliant with her medication. No recent seizures. Her blood pressure is been well controlled.  She states that she still has a poor appetite. She still has nausea. She has not yet picked up the prescription for Zofran. She states the pharmacy supposed to deliver to her today. She states she is drinking a lot of water to try to rehydrate. She is not eating very much. She hasn't had a bowel movement since Sunday. She hasn't vomited since Sunday. She still feels a little bit weak and dizzy. She is staying with family members during the day while her family is at work and school. No fever or chills.   Patient Active Problem List   Diagnosis Date Noted  . GERD (gastroesophageal reflux disease) 08/23/2015  . Osteoarthritis of right knee 08/15/2014  . Need for prophylactic vaccination and inoculation against influenza 12/10/2013  . 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 Reduced vision 05/03/2013  . 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 Annual physical exam 03/30/2012  . Fracture of metatarsal 08/06/2010  . Vitamin D deficiency 08/16/2009  . POLYNEUROPATHY 04/10/2009  . Hyperlipidemia LDL goal <100 07/15/2007  . Obesity, Class II, BMI 35.0-39.9, with comorbidity (see actual BMI) 07/15/2007  . Anxiety and depression 07/15/2007  . MENTAL RETARDATION, MILD 07/15/2007  . Essential hypertension 07/15/2007  . Convulsions ([place]) 07/15/2007    Outpatient Encounter Prescriptions as of  03/05/2016  Medication Sig  . acetaminophen (TYLENOL) 500 MG tablet Take 1 tablet (500 mg total) by mouth 2 (two) times daily.  . benazepril (LOTENSIN) 40 MG tablet Take 1 tablet (40 mg total) by mouth daily.  . calcium-vitamin D (OSCAL WITH D) 500-200 MG-UNIT per tablet Take 1 tablet by mouth 2 (two) times daily.  . ergocalciferol (VITAMIN D2) 50000 UNITS capsule Take 50,000 Units by mouth once a week.  Marland Kitchen FLUoxetine (PROZAC) 10 MG capsule TAKE (1) CAPSULE BY MOUTH ONCE DAILY.  . hydrochlorothiazide (HYDRODIURIL) 25 MG tablet Take 1 tablet (25 mg total) by mouth daily.  Marland Kitchen lamoTRIgine (LAMICTAL) 100 MG tablet Take 100 mg by mouth daily.  Marland Kitchen levETIRAcetam (KEPPRA) 750 MG tablet Take 1,500 mg by mouth 2 (two) times daily.   Marland Kitchen levothyroxine (SYNTHROID, LEVOTHROID) 112 MCG tablet TAKE 1 TABLET BY MOUTH DAILY BEFORE BREAKFAST.  Marland Kitchen loratadine (CLARITIN) 10 MG tablet Take 1 tablet (10 mg total) by mouth daily.  Marland Kitchen omeprazole (PRILOSEC) 20 MG capsule TAKE 2 CAPSULES BY MOUTH ONCE DAILY.  Marland Kitchen ondansetron (ZOFRAN ODT) 8 MG disintegrating tablet Take 1 tablet (8 mg total) by mouth every 8 (eight) hours as needed for nausea or vomiting.  . rosuvastatin (CRESTOR) 20 MG tablet Take 1 tablet (20 mg total) by mouth daily.   No facility-administered encounter medications on file as of 03/05/2016.     Allergies  Allergen Reactions  . Simvastatin Other (See Comments)    Muscle aches    Review of Systems  Constitutional: Positive for activity change, appetite change, fatigue and  unexpected weight change. Negative for diaphoresis and fever.        Fatigue. Poor appetite. Has lost 2 pounds  HENT: Positive for dental problem.        Chronic  Eyes: Negative for photophobia and visual disturbance.  Respiratory: Negative for cough and shortness of breath.   Cardiovascular: Negative for chest pain, palpitations and leg swelling.  Gastrointestinal: Positive for nausea. Negative for blood in stool, constipation,  diarrhea and vomiting.  Genitourinary: Negative for difficulty urinating and frequency.  Musculoskeletal: Negative.   Skin: Negative.   Neurological: Negative.  Negative for seizures.  Results for [name] (MRN [id number]) as of 03/05/2016 10:33  Ref. Range 02/28/2016 16:24  COMPREHENSIVE METABOLIC PANEL Unknown Rpt (A)  Sodium Latest Ref Range: 135 - 145 mmol/L 141  Potassium Latest Ref Range: 3.5 - 5.1 mmol/L 2.8 (L)  Chloride Latest Ref Range: 101 - 111 mmol/L 106  CO2 Latest Ref Range: 22 - 32 mmol/L 26  Glucose Latest Ref Range: 65 - 99 mg/dL 93  BUN Latest Ref Range: 6 - 20 mg/dL 34 (H)  Creatinine Latest Ref Range: 0.44 - 1.00 mg/dL 2.15 (H)  Calcium Latest Ref Range: 8.9 - 10.3 mg/dL 9.0  Anion gap Latest Ref Range: 5 - 15  9  Alkaline Phosphatase Latest Ref Range: 38 - 126 U/L 96  Albumin Latest Ref Range: 3.5 - 5.0 g/dL 3.9  Lipase Latest Ref Range: 11 - 51 U/L 62 (H)  AST Latest Ref Range: 15 - 41 U/L 32  ALT Latest Ref Range: 14 - 54 U/L 19  Total Protein Latest Ref Range: 6.5 - 8.1 g/dL 8.1  Total Bilirubin Latest Ref Range: 0.3 - 1.2 mg/dL 0.5  EGFR (African American) Latest Ref Range: >60 mL/min 27 (L)    BP 138/80 (BP Location: Right Arm, Patient Position: Sitting, Cuff Size: Large)   Pulse 100   Temp 98.8 F (37.1 C) (Oral)   Resp 18   Ht '5\' 5"'  (1.651 m)   Wt 244 lb (110.7 kg)   SpO2 95%   BMI 40.60 kg/m   Physical Exam  Constitutional: She appears well-developed and well-nourished. No distress.  Obese. Poor hygiene. Halitosis.  HENT:  Head: Normocephalic and atraumatic.  Mouth/Throat: Oropharynx is clear and moist.  Membranes are moist  Eyes: Conjunctivae are normal. Pupils are equal, round, and reactive to light.  Neck: Normal range of motion.  Cardiovascular: Normal rate, regular rhythm and normal heart sounds.   Pulmonary/Chest: Effort normal and breath sounds normal. No respiratory distress.  Abdominal: Soft. Bowel sounds are normal. There is  no tenderness.  Abdomen benign  Musculoskeletal: Normal range of motion. She exhibits no edema.  Lymphadenopathy:    She has no cervical adenopathy.  Neurological: She is alert.  Psychiatric: She has a normal mood and affect. Her behavior is normal.    ASSESSMENT/PLAN:  1. AKI (acute kidney injury) ([place])  - BASIC METABOLIC PANEL WITH GFR  2. Nausea without vomiting  3. Hypokalemia  I will repeat her blood work today. If she continues to have a high creatinine and hypokalemia we may need to hold her hydrochlorothiazide. She may need to be taken off her ACE inhibitor and refer to nephrology if her kidney impairment persists.  Patient Instructions  Need blood test today  Take the medicine for nausea as needed  Continue regular medicines  Drink plenty of water  We will call you with test results   Raylene Everts, MD

## 2016-03-05 NOTE — Patient Instructions (Signed)
Need blood test today  Take the medicine for nausea as needed  Continue regular medicines  Drink plenty of water  We will call you with test results

## 2016-03-05 NOTE — Telephone Encounter (Signed)
error 

## 2016-03-06 ENCOUNTER — Telehealth: Payer: Self-pay | Admitting: Family Medicine

## 2016-03-06 NOTE — Telephone Encounter (Signed)
-----   Message from Raylene Everts, MD sent at 03/06/2016  9:18 AM EST ----- Call to let her know the labs are back to her normal.  Needs appt with Joycelyn Schmid for worsening renal failure.

## 2016-03-06 NOTE — Telephone Encounter (Signed)
LMTCB

## 2016-03-19 ENCOUNTER — Other Ambulatory Visit: Payer: Self-pay | Admitting: "Endocrinology

## 2016-03-19 ENCOUNTER — Telehealth: Payer: Self-pay

## 2016-03-19 ENCOUNTER — Other Ambulatory Visit: Payer: Self-pay | Admitting: Family Medicine

## 2016-03-19 DIAGNOSIS — I10 Essential (primary) hypertension: Secondary | ICD-10-CM

## 2016-03-19 NOTE — Telephone Encounter (Signed)
-----   Message from Raylene Everts, MD sent at 03/06/2016  9:18 AM EST ----- Call to let her know the labs are back to her normal.  Needs appt with Joycelyn Schmid for worsening renal failure.

## 2016-03-19 NOTE — Telephone Encounter (Signed)
Aware of labs, and pt does have upcoming appt with dr Moshe Cipro

## 2016-03-19 NOTE — Telephone Encounter (Signed)
Left message to call back  

## 2016-04-01 ENCOUNTER — Ambulatory Visit (HOSPITAL_COMMUNITY): Payer: Medicaid Other

## 2016-04-02 ENCOUNTER — Ambulatory Visit (INDEPENDENT_AMBULATORY_CARE_PROVIDER_SITE_OTHER): Payer: Medicaid Other | Admitting: Family Medicine

## 2016-04-02 ENCOUNTER — Encounter: Payer: Self-pay | Admitting: Family Medicine

## 2016-04-02 VITALS — BP 150/90 | HR 92 | Resp 16 | Ht 65.0 in | Wt 246.0 lb

## 2016-04-02 DIAGNOSIS — N183 Chronic kidney disease, stage 3 unspecified: Secondary | ICD-10-CM

## 2016-04-02 DIAGNOSIS — E559 Vitamin D deficiency, unspecified: Secondary | ICD-10-CM | POA: Diagnosis not present

## 2016-04-02 DIAGNOSIS — I10 Essential (primary) hypertension: Secondary | ICD-10-CM | POA: Diagnosis not present

## 2016-04-02 DIAGNOSIS — Z23 Encounter for immunization: Secondary | ICD-10-CM

## 2016-04-02 DIAGNOSIS — E785 Hyperlipidemia, unspecified: Secondary | ICD-10-CM | POA: Diagnosis not present

## 2016-04-02 DIAGNOSIS — R569 Unspecified convulsions: Secondary | ICD-10-CM

## 2016-04-02 MED ORDER — LEVETIRACETAM 750 MG PO TABS: ORAL_TABLET | ORAL | 0 refills | Status: DC

## 2016-04-02 NOTE — Progress Notes (Signed)
   KAEDEN DEPAZ     MRN: [id number]      DOB: Apr 25, 1955   HPI Ms. Hakim is here for follow up . Had 3 seizures 4 days ago, bit her upper inner lip which is still swollen and raw on the inside. Was seen earlier this year  for ED follow up of acute GE, and AKI which resolved taking her back to baseline by the time she was seen in the office. Pt changed her address, pharamcy did not know where to deliver her medication, nurse in office has called pharmacy and notified them of the new address ROS Denies recent fever or chills. Denies sinus pressure, nasal congestion, ear pain or sore throat. Denies chest congestion, productive cough or wheezing. Denies chest pains, palpitations and leg swelling Denies abdominal pain, nausea, vomiting,diarrhea or constipation.   Denies dysuria, frequency, hesitancy or incontinence. Denies joint pain, swelling and limitation in mobility.  Denies depression, anxiety or insomnia.    PE  BP (!) 150/90   Pulse 92   Resp 16   Ht 5\' 5"  (1.651 m)   Wt 246 lb (111.6 kg)   SpO2 97%   BMI 40.94 kg/m   Patient alert and oriented and in no cardiopulmonary distress.  HEENT: No facial asymmetry, EOMI,   oropharynx pink and moist.  Neck supple no JVD, no mass.  Chest: Clear to auscultation bilaterally.  CVS: S1, S2 no murmurs, no S3.Regular rate.  ABD: Soft non tender.   Ext: No edema  MS: Adequate ROM spine, shoulders, hips and knees.  Skin: Abrasion  on inner upper gum still healing  Psych: Good eye contact, normal affect. Memory impaired not anxious or depressed appearing.  CNS: CN 2-12 intact, power,  normal throughout.no focal deficits noted.   Assessment & Plan  Need for Tdap vaccination Recent trauma to upper inner mouth during seizure 4 days ago, upper lip still swollen with abrasion inside of mouth TdAP due and admministered  Essential hypertension Elevated at visit, pt reports not taking her medication at her usual time on the day  of the visit. Meds taken during visit. Will pay attention to possible need for med adjustment at next visit DASH diet and commitment to daily physical activity for a minimum of 30 minutes discussed and encouraged, as a part of hypertension management. The importance of attaining a healthy weight is also discussed.  BP/Weight 04/02/2016 03/05/2016 02/28/2016 01/29/2016 01/21/2016 9/56/2130 09/30/5782  Systolic BP 696 295 284 132 440 102 725  Diastolic BP 90 80 68 80 76 82 80  Wt. (Lbs) 246 244 140 246.12 246 241 240  BMI 40.94 40.6 23.3 40.96 40.94 40.1 39.94       Convulsions Uncontrolled due to unavailability of medication leading to non compliance, staff discussed with the pharmacy the fact that her address has changed and the situation is attended to. Has appt with neurologist this week also  Hyperlipidemia LDL goal <100 Hyperlipidemia:Low fat diet discussed and encouraged.  Uncontrolled, updated lab needed for next visit Lipid Panel  Lab Results  Component Value Date   CHOL 187 01/29/2016   HDL 50 (L) 01/29/2016   LDLCALC 123 (H) 01/29/2016   TRIG 72 01/29/2016   CHOLHDL 3.7 01/29/2016       CKD (chronic kidney disease) stage 3, GFR 30-59 ml/min Improved blood pressure control, dietary change and healthy lifestyle are current goals to arresting or reversing the trend, will also refer to nephrology Avoidance of NSAIDS is stressed

## 2016-04-02 NOTE — Patient Instructions (Addendum)
F/u in May as before, call if you need me sooner  You will get a vaccine for tetanus today, due to recent seizure causing you to bite your upper lip    The pharmacy will deliver both of your seizure medications to you today  Fasting lipid, cmp and eGFR , Vit D 1 week before next vist.  Please make sure that you drink at least 64 ounces of water every day

## 2016-04-02 NOTE — Assessment & Plan Note (Addendum)
Recent trauma to upper inner mouth during seizure 4 days ago, upper lip still swollen with abrasion inside of mouth TdAP due and admministered

## 2016-04-03 ENCOUNTER — Telehealth: Payer: Self-pay | Admitting: Family Medicine

## 2016-04-03 ENCOUNTER — Telehealth: Payer: Self-pay

## 2016-04-03 DIAGNOSIS — N183 Chronic kidney disease, stage 3 unspecified: Secondary | ICD-10-CM | POA: Insufficient documentation

## 2016-04-03 NOTE — Telephone Encounter (Signed)
Pt's daugther [facility]) called to schedule patient's colonoscopy. Please call her at [phone]

## 2016-04-03 NOTE — Telephone Encounter (Signed)
Thanks, also let her know the referral to Dr Lindaann Slough for chronic kidney disease, sorry I did not make this clear when we spoke earlier/ in my note

## 2016-04-03 NOTE — Assessment & Plan Note (Signed)
Elevated at visit, pt reports not taking her medication at her usual time on the day of the visit. Meds taken during visit. Will pay attention to possible need for med adjustment at next visit DASH diet and commitment to daily physical activity for a minimum of 30 minutes discussed and encouraged, as a part of hypertension management. The importance of attaining a healthy weight is also discussed.  BP/Weight 04/02/2016 03/05/2016 02/28/2016 01/29/2016 01/21/2016 09/05/4578 11/02/8336  Systolic BP 250 539 767 341 937 902 409  Diastolic BP 90 80 68 80 76 82 80  Wt. (Lbs) 246 244 140 246.12 246 241 240  BMI 40.94 40.6 23.3 40.96 40.94 40.1 39.94

## 2016-04-03 NOTE — Telephone Encounter (Signed)
I spoke to [place] daughter and gave her Dr. Oneida Alar Address & Phone # and [name] is going to call [facility] and get her scheduled with Dr. Oneida Alar

## 2016-04-03 NOTE — Telephone Encounter (Signed)
Noted, thanks!

## 2016-04-03 NOTE — Assessment & Plan Note (Addendum)
Improved blood pressure control, dietary change and healthy lifestyle are current goals to arresting or reversing the trend, will also refer to nephrology Avoidance of NSAIDS is stressed

## 2016-04-03 NOTE — Assessment & Plan Note (Signed)
Uncontrolled due to unavailability of medication leading to non compliance, staff discussed with the pharmacy the fact that her address has changed and the situation is attended to. Has appt with neurologist this week also

## 2016-04-03 NOTE — Telephone Encounter (Signed)
Darius Bump , I am asking for your help as  You try to coordinate referral appts for [name] is the nurse who fills her meds awweeekly. We have tried yo arrange colonoscopy since last year, has not had this. I have now entered a referral to nephrology. Let me know if unable pls

## 2016-04-03 NOTE — Telephone Encounter (Signed)
I told Dr. Liz Malady office to call Diane her daughter to schedule appointment

## 2016-04-03 NOTE — Assessment & Plan Note (Signed)
Hyperlipidemia:Low fat diet discussed and encouraged.  Uncontrolled, updated lab needed for next visit Lipid Panel  Lab Results  Component Value Date   CHOL 187 01/29/2016   HDL 50 (L) 01/29/2016   LDLCALC 123 (H) 01/29/2016   TRIG 72 01/29/2016   CHOLHDL 3.7 01/29/2016

## 2016-04-05 ENCOUNTER — Encounter (HOSPITAL_COMMUNITY): Payer: Self-pay | Admitting: Emergency Medicine

## 2016-04-05 DIAGNOSIS — N3000 Acute cystitis without hematuria: Secondary | ICD-10-CM | POA: Insufficient documentation

## 2016-04-05 DIAGNOSIS — E039 Hypothyroidism, unspecified: Secondary | ICD-10-CM | POA: Diagnosis not present

## 2016-04-05 DIAGNOSIS — Z79899 Other long term (current) drug therapy: Secondary | ICD-10-CM | POA: Insufficient documentation

## 2016-04-05 DIAGNOSIS — I129 Hypertensive chronic kidney disease with stage 1 through stage 4 chronic kidney disease, or unspecified chronic kidney disease: Secondary | ICD-10-CM | POA: Diagnosis not present

## 2016-04-05 DIAGNOSIS — R1032 Left lower quadrant pain: Secondary | ICD-10-CM | POA: Diagnosis present

## 2016-04-05 DIAGNOSIS — N183 Chronic kidney disease, stage 3 (moderate): Secondary | ICD-10-CM | POA: Diagnosis not present

## 2016-04-05 NOTE — ED Triage Notes (Signed)
Pt states that she has been having seizures on and off today and "feels hot"

## 2016-04-06 ENCOUNTER — Emergency Department (HOSPITAL_COMMUNITY): Payer: Medicaid Other

## 2016-04-06 ENCOUNTER — Emergency Department (HOSPITAL_COMMUNITY)
Admission: EM | Admit: 2016-04-06 | Discharge: 2016-04-06 | Disposition: A | Discharge status: Home / Self Care | Payer: Medicaid Other | Attending: Emergency Medicine | Admitting: Emergency Medicine

## 2016-04-06 DIAGNOSIS — R1032 Left lower quadrant pain: Secondary | ICD-10-CM

## 2016-04-06 DIAGNOSIS — N3000 Acute cystitis without hematuria: Secondary | ICD-10-CM

## 2016-04-06 LAB — COMPREHENSIVE METABOLIC PANEL
ALT: 12 U/L — ABNORMAL LOW (ref 14–54)
AST: 18 U/L (ref 15–41)
Albumin: 3.8 g/dL (ref 3.5–5.0)
Alkaline Phosphatase: 98 U/L (ref 38–126)
Anion gap: 9 (ref 5–15)
BUN: 22 mg/dL — ABNORMAL HIGH (ref 6–20)
CO2: 25 mmol/L (ref 22–32)
Calcium: 9.2 mg/dL (ref 8.9–10.3)
Chloride: 102 mmol/L (ref 101–111)
Creatinine, Ser: 1.37 mg/dL — ABNORMAL HIGH (ref 0.44–1.00)
GFR calc Af Amer: 47 mL/min — ABNORMAL LOW (ref >60)
GFR calc non Af Amer: 40 mL/min — ABNORMAL LOW (ref >60)
Glucose, Bld: 103 mg/dL — ABNORMAL HIGH (ref 65–99)
Potassium: 3.6 mmol/L (ref 3.5–5.1)
Sodium: 136 mmol/L (ref 135–145)
Total Bilirubin: 0.7 mg/dL (ref 0.3–1.2)
Total Protein: 7.9 g/dL (ref 6.5–8.1)

## 2016-04-06 LAB — CBC WITH DIFFERENTIAL/PLATELET
Basophils Absolute: 0 10*3/uL (ref 0.0–0.1)
Basophils Relative: 1 %
Eosinophils Absolute: 0 10*3/uL (ref 0.0–0.7)
Eosinophils Relative: 1 %
HCT: 34.6 % — ABNORMAL LOW (ref 36.0–46.0)
Hemoglobin: 11.5 g/dL — ABNORMAL LOW (ref 12.0–15.0)
Lymphocytes Relative: 23 %
Lymphs Abs: 1.3 10*3/uL (ref 0.7–4.0)
MCH: 28.3 pg (ref 26.0–34.0)
MCHC: 33.2 g/dL (ref 30.0–36.0)
MCV: 85.2 fL (ref 78.0–100.0)
Monocytes Absolute: 0.5 10*3/uL (ref 0.1–1.0)
Monocytes Relative: 9 %
Neutro Abs: 3.7 10*3/uL (ref 1.7–7.7)
Neutrophils Relative %: 66 %
Platelets: 221 10*3/uL (ref 150–400)
RBC: 4.06 MIL/uL (ref 3.87–5.11)
RDW: 14.2 % (ref 11.5–15.5)
WBC: 5.5 10*3/uL (ref 4.0–10.5)

## 2016-04-06 LAB — URINALYSIS, ROUTINE W REFLEX MICROSCOPIC
Bacteria, UA: NONE SEEN
Bilirubin Urine: NEGATIVE
Glucose, UA: NEGATIVE mg/dL
Hgb urine dipstick: NEGATIVE
Ketones, ur: NEGATIVE mg/dL
Nitrite: NEGATIVE
Protein, ur: 30 mg/dL — AB
Specific Gravity, Urine: 1.018 (ref 1.005–1.030)
pH: 5 (ref 5.0–8.0)

## 2016-04-06 LAB — LIPASE, BLOOD: Lipase: 51 U/L (ref 11–51)

## 2016-04-06 MED ORDER — IOPAMIDOL (ISOVUE-300) INJECTION 61%
INTRAVENOUS | Status: AC
  Administered 2016-04-06: 100 mL
  Filled 2016-04-06: qty 30

## 2016-04-06 MED ORDER — CEPHALEXIN 500 MG PO CAPS: 500.0000 mg | ORAL_CAPSULE | Freq: Three times a day (TID) | ORAL | 0 refills | Status: DC

## 2016-04-06 MED ORDER — PHENAZOPYRIDINE HCL 200 MG PO TABS: 200.0000 mg | ORAL_TABLET | Freq: Three times a day (TID) | ORAL | 0 refills | Status: DC

## 2016-04-06 MED ORDER — SODIUM CHLORIDE 0.9 % IV BOLUS (SEPSIS)
500.0000 mL | Freq: Once | INTRAVENOUS | Status: AC
  Administered 2016-04-06: 500 mL via INTRAVENOUS

## 2016-04-06 MED ORDER — IOPAMIDOL (ISOVUE-300) INJECTION 61%
INTRAVENOUS | Status: AC
  Administered 2016-04-06: 30 mL
  Filled 2016-04-06: qty 100

## 2016-04-06 MED ORDER — SODIUM CHLORIDE 0.9 % IV BOLUS (SEPSIS)
1000.0000 mL | Freq: Once | INTRAVENOUS | Status: AC
  Administered 2016-04-06: 1000 mL via INTRAVENOUS

## 2016-04-06 MED ORDER — DEXTROSE 5 % IV SOLN
1.0000 g | Freq: Once | INTRAVENOUS | Status: AC
  Administered 2016-04-06: 1 g via INTRAVENOUS
  Filled 2016-04-06: qty 10

## 2016-04-06 NOTE — Discharge Instructions (Signed)
Drink plenty of fluids. Start the antibiotics tomorrow. You can take the pyridium for pain on urination. It will stain your urine orange.  Recheck if you get fever, vomiting, your symptoms are improving over the next couple of days, or if your abdominal pain gets worse.

## 2016-04-06 NOTE — ED Notes (Signed)
Patient states that she is having pain on lower left side. States that it hurts and burns when she goes to the bathroom started 2-10018 and continues. Patient states that when she eats it makes her stomach ache. States that she wants to get checked out all over.

## 2016-04-06 NOTE — ED Provider Notes (Signed)
[place] DEPT Provider Note   CSN: [id number] Arrival date & time: 04/05/16  1955  Time seen 03:15 AM   History   Chief Complaint Chief Complaint  Patient presents with  . Seizures    HPI [name] is a 61 y.o. female.  HPI  patient complains of pain in her left lower quadrant "next to my kidneys" that started yesterday. She states the pain has been there constantly. It hurts more if she eats, nothing makes it feel better. She denies any nausea or vomiting, diarrhea, fever, coughing. She does have dysuria and frequency but denies hematuria. She also states she's had loss of appetite. Patient states she feels like "my seizures are going to halfway start up". She states she has discussed this with her PCP who doesn't know why she feels that way.  PCP Tula Nakayama, MD   Past Medical History:  Diagnosis Date  . Depression   . Hyperlipidemia   . Hypertension   . Mental retardation, mild (I.Q. 50-70)   . Obesity   . Seizure ([place])    11/17/2012 "still having them; "I believe their from a tumor in my head" ((11/17/2012  . Seizure disorder ([place])   . Thyroid cancer [facility])     Patient Active Problem List   Diagnosis Date Noted  . CKD (chronic kidney disease) stage 3, GFR 30-59 ml/min 04/03/2016  . GERD (gastroesophageal reflux disease) 08/23/2015  . Osteoarthritis of right knee 08/15/2014  . Need for Tdap vaccination 04/10/2014  . 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 Reduced vision 05/03/2013  . 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 Vitamin D deficiency 08/16/2009  . POLYNEUROPATHY 04/10/2009  . Hyperlipidemia LDL goal <100 07/15/2007  . Obesity, Class II, BMI 35.0-39.9, with comorbidity (see actual BMI) 07/15/2007  . Anxiety and depression 07/15/2007  . MENTAL RETARDATION, MILD 07/15/2007  . Essential hypertension 07/15/2007  . Convulsions ([place]) 07/15/2007    Past Surgical History:  Procedure Laterality Date  . ABDOMINAL HYSTERECTOMY  2001    . BREAST LUMPECTOMY  1996   right   . THYROIDECTOMY Bilateral 11/17/2012   Procedure: TOTAL THYROIDECTOMY;  Surgeon: Ascencion Dike, MD;  Location: [place];  Service: ENT;  Laterality: Bilateral;  . TOTAL THYROIDECTOMY Bilateral 11/17/2012  . TUBAL LIGATION      OB History    No data available       Home Medications    Prior to Admission medications   Medication Sig Start Date End Date Taking? Authorizing Provider  acetaminophen (TYLENOL) 500 MG tablet Take 1 tablet (500 mg total) by mouth 2 (two) times daily. 08/15/14   [name], MD  benazepril (LOTENSIN) 40 MG tablet TAKE (1) TABLET BY MOUTH DAILY. 03/19/16   [name], MD  calcium-vitamin D (OSCAL WITH D) 500-200 MG-UNIT per tablet Take 1 tablet by mouth 2 (two) times daily. 08/04/13   [name], MD  cephALEXin (KEFLEX) 500 MG capsule Take 1 capsule (500 mg total) by mouth 3 (three) times daily. 04/06/16   Rolland Porter, MD  ergocalciferol (VITAMIN D2) 50000 UNITS capsule Take 50,000 Units by mouth once a week.    Historical Provider, MD  FLUoxetine (PROZAC) 10 MG capsule TAKE (1) CAPSULE BY MOUTH ONCE DAILY. 03/19/16   [name], MD  hydrochlorothiazide (HYDRODIURIL) 25 MG tablet TAKE ONE TABLET BY MOUTH ONCE DAILY. 03/19/16   [name], MD  lamoTRIgine (LAMICTAL) 100 MG tablet Take 100 mg by mouth daily.    Historical  Provider, MD  levETIRAcetam (KEPPRA) 750 MG tablet Take 1,500 mg by mouth 2 (two) times daily.     Historical Provider, MD  levothyroxine (SYNTHROID, LEVOTHROID) 112 MCG tablet TAKE 1 TABLET BY MOUTH DAILY BEFORE BREAKFAST. 03/20/16   Cassandria Anger, MD  loratadine (CLARITIN) 10 MG tablet TAKE ONE TABLET BY MOUTH ONCE DAILY. 03/19/16   [name], MD  omeprazole (PRILOSEC) 20 MG capsule TAKE 2 CAPSULES BY MOUTH ONCE DAILY. 03/19/16   [name], MD  ondansetron (ZOFRAN ODT) 8 MG disintegrating tablet Take 1 tablet (8 mg total) by mouth every 8 (eight) hours as needed  for nausea or vomiting. 02/28/16   Pattricia Boss, MD  phenazopyridine (PYRIDIUM) 200 MG tablet Take 1 tablet (200 mg total) by mouth 3 (three) times daily. 04/06/16   Rolland Porter, MD  rosuvastatin (CRESTOR) 20 MG tablet TAKE 1 TABLET BY MOUTH ONCE A DAY. 03/19/16   [name], MD    Family History Family History  Problem Relation Age of Onset  . Diabetes Mother     Social History Social History  Substance Use Topics  . Smoking status: Never Smoker  . Smokeless tobacco: Never Used  . Alcohol use No  on disability Lives with a friend   Allergies   Simvastatin   Review of Systems Review of Systems  All other systems reviewed and are negative.    Physical Exam Updated Vital Signs BP 136/71 (BP Location: Left Arm)   Pulse 88   Temp 98.4 F (36.9 C) (Oral)   Resp 20   Ht 5\' 5"  (1.651 m)   Wt 246 lb (111.6 kg)   SpO2 98%   BMI 40.94 kg/m   Vital signs normal    Physical Exam  Constitutional: She appears well-developed and well-nourished.  Non-toxic appearance. She does not appear ill. No distress.  Smiling, repeats herself alot  HENT:  Head: Normocephalic and atraumatic.  Right Ear: External ear normal.  Left Ear: External ear normal.  Nose: Nose normal. No mucosal edema or rhinorrhea.  Mouth/Throat: Oropharynx is clear and moist and mucous membranes are normal. No dental abscesses or uvula swelling.  Eyes: Conjunctivae and EOM are normal. Pupils are equal, round, and reactive to light.  Neck: Normal range of motion and full passive range of motion without pain. Neck supple.  Cardiovascular: Normal rate, regular rhythm and normal heart sounds.  Exam reveals no gallop and no friction rub.   No murmur heard. Pulmonary/Chest: Effort normal and breath sounds normal. No respiratory distress. She has no wheezes. She has no rhonchi. She has no rales. She exhibits no tenderness and no crepitus.  Abdominal: Soft. Normal appearance and bowel sounds are normal. She  exhibits no distension. There is tenderness in the left lower quadrant. There is no rebound and no guarding.    Very tender in her left lower quadrant  Musculoskeletal: Normal range of motion. She exhibits no edema or tenderness.  Moves all extremities well.   Neurological: She is alert. She has normal strength. No cranial nerve deficit.  Skin: Skin is warm, dry and intact. No rash noted. No erythema. No pallor.  Psychiatric: She has a normal mood and affect. Her speech is normal and behavior is normal. Her mood appears not anxious.  Nursing note and vitals reviewed.    ED Treatments / Results  Labs (all labs ordered are listed, but only abnormal results are displayed)  Results for orders placed or performed during the hospital encounter of  04/06/16  Comprehensive metabolic panel  Result Value Ref Range   Sodium 136 135 - 145 mmol/L   Potassium 3.6 3.5 - 5.1 mmol/L   Chloride 102 101 - 111 mmol/L   CO2 25 22 - 32 mmol/L   Glucose, Bld 103 (H) 65 - 99 mg/dL   BUN 22 (H) 6 - 20 mg/dL   Creatinine, Ser 1.37 (H) 0.44 - 1.00 mg/dL   Calcium 9.2 8.9 - 10.3 mg/dL   Total Protein 7.9 6.5 - 8.1 g/dL   Albumin 3.8 3.5 - 5.0 g/dL   AST 18 15 - 41 U/L   ALT 12 (L) 14 - 54 U/L   Alkaline Phosphatase 98 38 - 126 U/L   Total Bilirubin 0.7 0.3 - 1.2 mg/dL   GFR calc non Af Amer 40 (L) >60 mL/min   GFR calc Af Amer 47 (L) >60 mL/min   Anion gap 9 5 - 15  Lipase, blood  Result Value Ref Range   Lipase 51 11 - 51 U/L  CBC with Differential  Result Value Ref Range   WBC 5.5 4.0 - 10.5 K/uL   RBC 4.06 3.87 - 5.11 MIL/uL   Hemoglobin 11.5 (L) 12.0 - 15.0 g/dL   HCT 34.6 (L) 36.0 - 46.0 %   MCV 85.2 78.0 - 100.0 fL   MCH 28.3 26.0 - 34.0 pg   MCHC 33.2 30.0 - 36.0 g/dL   RDW 14.2 11.5 - 15.5 %   Platelets 221 150 - 400 K/uL   Neutrophils Relative % 66 %   Neutro Abs 3.7 1.7 - 7.7 K/uL   Lymphocytes Relative 23 %   Lymphs Abs 1.3 0.7 - 4.0 K/uL   Monocytes Relative 9 %   Monocytes  Absolute 0.5 0.1 - 1.0 K/uL   Eosinophils Relative 1 %   Eosinophils Absolute 0.0 0.0 - 0.7 K/uL   Basophils Relative 1 %   Basophils Absolute 0.0 0.0 - 0.1 K/uL  Urinalysis, Routine w reflex microscopic  Result Value Ref Range   Color, Urine YELLOW YELLOW   APPearance HAZY (A) CLEAR   Specific Gravity, Urine 1.018 1.005 - 1.030   pH 5.0 5.0 - 8.0   Glucose, UA NEGATIVE NEGATIVE mg/dL   Hgb urine dipstick NEGATIVE NEGATIVE   Bilirubin Urine NEGATIVE NEGATIVE   Ketones, ur NEGATIVE NEGATIVE mg/dL   Protein, ur 30 (A) NEGATIVE mg/dL   Nitrite NEGATIVE NEGATIVE   Leukocytes, UA SMALL (A) NEGATIVE   RBC / HPF 0-5 0 - 5 RBC/hpf   WBC, UA TOO NUMEROUS TO COUNT 0 - 5 WBC/hpf   Bacteria, UA NONE SEEN NONE SEEN   Laboratory interpretation all normal except UTI, mild anemia, renal insufficiency   EKG  EKG Interpretation None       Radiology Ct Abdomen Pelvis W Contrast  Result Date: 04/06/2016 CLINICAL DATA:  Left lower quadrant pain for 1 day. EXAM: CT ABDOMEN AND PELVIS WITH CONTRAST TECHNIQUE: Multidetector CT imaging of the abdomen and pelvis was performed using the standard protocol following bolus administration of intravenous contrast. CONTRAST:  110mL ISOVUE-300 IOPAMIDOL (ISOVUE-300) INJECTION 61%, 64mL ISOVUE-300 IOPAMIDOL (ISOVUE-300) INJECTION 61% COMPARISON:  02/28/2016 FINDINGS: Lower chest: Mild dependent changes in the lung bases. Hepatobiliary: No focal liver abnormality is seen. No gallstones, gallbladder wall thickening, or biliary dilatation. Pancreas: Unremarkable. No pancreatic ductal dilatation or surrounding inflammatory changes. Spleen: Normal in size without focal abnormality. Adrenals/Urinary Tract: No adrenal gland nodules. Multiple cysts on the right kidney. Renal nephrograms are symmetrical.  No hydronephrosis or hydroureter. Bladder wall is not thickened. Stomach/Bowel: Stomach and small bowel are decompressed. Contrast material flows through to the colon  suggesting no evidence of obstruction. Scattered stool throughout the colon. No colonic distention or wall thickening. Appendix is normal. Vascular/Lymphatic: Aortic atherosclerosis. No enlarged abdominal or pelvic lymph nodes. Reproductive: Status post hysterectomy. No adnexal masses. Other: No abdominal wall hernia or abnormality. No abdominopelvic ascites. Musculoskeletal: No acute or significant osseous findings. IMPRESSION: No acute process demonstrated in the abdomen or pelvis. No evidence of bowel obstruction or inflammation. Electronically Signed   By: Lucienne Capers M.D.   On: 04/06/2016 06:26    Procedures Procedures (including critical care time)  Medications Ordered in ED Medications  iopamidol (ISOVUE-300) 61 % injection (30 mLs  Contrast Given 04/06/16 0603)  iopamidol (ISOVUE-300) 61 % injection (100 mLs  Contrast Given 04/06/16 0603)  cefTRIAXone (ROCEPHIN) 1 g in dextrose 5 % 50 mL IVPB (1 g Intravenous New Bag/Given 04/06/16 0718)  sodium chloride 0.9 % bolus 1,000 mL (1,000 mLs Intravenous New Bag/Given 04/06/16 0745)  sodium chloride 0.9 % bolus 500 mL (500 mLs Intravenous New Bag/Given 04/06/16 0745)     Initial Impression / Assessment and Plan / ED Course  I have reviewed the triage vital signs and the nursing notes.  Pertinent labs & imaging results that were available during my care of the patient were reviewed by me and considered in my medical decision making (see chart for details).  Due to patient's mental retardation and her physical exam CT scan was done. After reviewing her CT scan and her laboratory results she was given IV Rocephin and IV fluids, she has a renal insufficiency and she was given dye for her CT scan.  Check at 7 AM patient was given her test results. She got IV Rocephin for her UTI. I explained that would be good for 24 hours and she can get her prescription filled tomorrow. Patient states she has a home health nurse who puts her pills in a container  for her so she knows what to take.   Final Clinical Impressions(s) / ED Diagnoses   Final diagnoses:  Left lower quadrant pain  Acute cystitis without hematuria    New Prescriptions New Prescriptions   CEPHALEXIN (KEFLEX) 500 MG CAPSULE    Take 1 capsule (500 mg total) by mouth 3 (three) times daily.   PHENAZOPYRIDINE (PYRIDIUM) 200 MG TABLET    Take 1 tablet (200 mg total) by mouth 3 (three) times daily.    Plan discharge  Rolland Porter, MD, Barbette Or, MD 04/06/16 [id number]

## 2016-04-06 NOTE — ED Notes (Signed)
Pt in CT at this time.

## 2016-04-08 LAB — URINE CULTURE

## 2016-04-14 ENCOUNTER — Encounter (HOSPITAL_COMMUNITY)
Admission: RE | Admit: 2016-04-14 | Discharge: 2016-04-14 | Disposition: A | Discharge status: Home / Self Care | Payer: Medicaid Other | Source: Ambulatory Visit | Attending: "Endocrinology | Admitting: "Endocrinology

## 2016-04-14 ENCOUNTER — Encounter (HOSPITAL_COMMUNITY): Payer: Self-pay

## 2016-04-14 DIAGNOSIS — C73 Malignant neoplasm of thyroid gland: Secondary | ICD-10-CM | POA: Diagnosis not present

## 2016-04-14 MED ORDER — THYROTROPIN ALFA 1.1 MG IM SOLR
INTRAMUSCULAR | Status: AC
  Administered 2016-04-14: 0.9 mg via INTRAMUSCULAR
  Filled 2016-04-14: qty 0.9

## 2016-04-14 MED ORDER — THYROTROPIN ALFA 1.1 MG IM SOLR
0.9000 mg | INTRAMUSCULAR | Status: AC
  Administered 2016-04-14: 0.9 mg via INTRAMUSCULAR

## 2016-04-14 MED ORDER — STERILE WATER FOR INJECTION IJ SOLN
INTRAMUSCULAR | Status: AC
  Administered 2016-04-14: 5 mL
  Filled 2016-04-14: qty 10

## 2016-04-15 ENCOUNTER — Encounter (HOSPITAL_COMMUNITY)
Admission: RE | Admit: 2016-04-15 | Discharge: 2016-04-15 | Disposition: A | Discharge status: Home / Self Care | Payer: Medicaid Other | Source: Ambulatory Visit | Attending: "Endocrinology | Admitting: "Endocrinology

## 2016-04-15 DIAGNOSIS — C73 Malignant neoplasm of thyroid gland: Secondary | ICD-10-CM

## 2016-04-15 MED ORDER — THYROTROPIN ALFA 1.1 MG IM SOLR
0.9000 mg | INTRAMUSCULAR | Status: AC
  Administered 2016-04-15: 0.9 mg via INTRAMUSCULAR

## 2016-04-15 MED ORDER — THYROTROPIN ALFA 1.1 MG IM SOLR
INTRAMUSCULAR | Status: AC
  Administered 2016-04-15: 0.9 mg via INTRAMUSCULAR
  Filled 2016-04-15: qty 0.9

## 2016-04-16 ENCOUNTER — Encounter (HOSPITAL_COMMUNITY)
Admission: RE | Admit: 2016-04-16 | Discharge: 2016-04-16 | Disposition: A | Discharge status: Home / Self Care | Payer: Medicaid Other | Source: Ambulatory Visit | Attending: "Endocrinology | Admitting: "Endocrinology

## 2016-04-16 ENCOUNTER — Encounter (HOSPITAL_COMMUNITY): Payer: Self-pay

## 2016-04-16 MED ORDER — SODIUM IODIDE I 131 CAPSULE
3.9600 | Freq: Once | INTRAVENOUS | Status: AC | PRN
  Administered 2016-04-16: 3.96 via ORAL

## 2016-04-16 NOTE — Telephone Encounter (Signed)
LMOM to call.

## 2016-04-18 ENCOUNTER — Encounter (HOSPITAL_COMMUNITY)
Admission: RE | Admit: 2016-04-18 | Discharge: 2016-04-18 | Disposition: A | Discharge status: Home / Self Care | Payer: Medicaid Other | Source: Ambulatory Visit | Attending: "Endocrinology | Admitting: "Endocrinology

## 2016-04-18 DIAGNOSIS — C73 Malignant neoplasm of thyroid gland: Secondary | ICD-10-CM | POA: Diagnosis not present

## 2016-04-22 ENCOUNTER — Other Ambulatory Visit: Payer: Self-pay | Admitting: "Endocrinology

## 2016-04-22 LAB — TSH: TSH: 6.63 mIU/L — ABNORMAL HIGH

## 2016-04-22 LAB — T4, FREE: Free T4: 1.5 ng/dL (ref 0.8–1.8)

## 2016-04-23 ENCOUNTER — Ambulatory Visit: Payer: Medicaid Other | Admitting: "Endocrinology

## 2016-04-23 LAB — THYROGLOBULIN LEVEL: Thyroglobulin: 2.7 ng/mL — ABNORMAL LOW

## 2016-04-23 LAB — THYROGLOBULIN ANTIBODY: Thyroglobulin Ab: 1 IU/mL (ref <2)

## 2016-04-28 NOTE — Telephone Encounter (Signed)
Gastroenterology Pre-Procedure Review  Request Date: Requesting Physician:   PATIENT REVIEW QUESTIONS: The patient responded to the following health history questions as indicated:    1. Diabetes Melitis: NO 2. Joint replacements in the past 12 months: NO 3. Major health problems in the past 3 months: NO 4. Has an artificial valve or MVP: NO 5. Has a defibrillator: NIO 6. Has been advised in past to take antibiotics in advance of a procedure like teeth cleaning: NO 7. Family history of colon cancer: NO 8. Alcohol Use: NO 9. History of sleep apnea: NO 10. History of coronary artery or other vascular stents placed within the last 12 months: NO    MEDICATIONS & ALLERGIES:    Patient reports the following regarding taking any blood thinners:   Plavix? NO Aspirin? YES Coumadin? NO Brilinta? NO Xarelto? NO Eliquis? NO Pradaxa? NO Savaysa? NO Effient? NO  Patient confirms/reports the following medications:  Current Outpatient Prescriptions  Medication Sig Dispense Refill  . acetaminophen (TYLENOL) 500 MG tablet Take 1 tablet (500 mg total) by mouth 2 (two) times daily. 60 tablet 5  . benazepril (LOTENSIN) 40 MG tablet TAKE (1) TABLET BY MOUTH DAILY. 90 tablet 1  . calcium-vitamin D (OSCAL WITH D) 500-200 MG-UNIT per tablet Take 1 tablet by mouth 2 (two) times daily. 60 tablet 11  . ergocalciferol (VITAMIN D2) 50000 UNITS capsule Take 50,000 Units by mouth once a week.    Marland Kitchen FLUoxetine (PROZAC) 10 MG capsule TAKE (1) CAPSULE BY MOUTH ONCE DAILY. 90 capsule 1  . hydrochlorothiazide (HYDRODIURIL) 25 MG tablet TAKE ONE TABLET BY MOUTH ONCE DAILY. 90 tablet 1  . lamoTRIgine (LAMICTAL) 100 MG tablet Take 100 mg by mouth daily.    Marland Kitchen levETIRAcetam (KEPPRA) 750 MG tablet Take 1,500 mg by mouth 2 (two) times daily.     Marland Kitchen levothyroxine (SYNTHROID, LEVOTHROID) 112 MCG tablet TAKE 1 TABLET BY MOUTH DAILY BEFORE BREAKFAST. 30 tablet 3  . loratadine (CLARITIN) 10 MG tablet TAKE ONE TABLET BY MOUTH  ONCE DAILY. 90 tablet 1  . omeprazole (PRILOSEC) 20 MG capsule TAKE 2 CAPSULES BY MOUTH ONCE DAILY. 180 capsule 1  . phenazopyridine (PYRIDIUM) 200 MG tablet Take 1 tablet (200 mg total) by mouth 3 (three) times daily. 6 tablet 0  . rosuvastatin (CRESTOR) 20 MG tablet TAKE 1 TABLET BY MOUTH ONCE A DAY. 90 tablet 1  . cephALEXin (KEFLEX) 500 MG capsule Take 1 capsule (500 mg total) by mouth 3 (three) times daily. (Patient not taking: Reported on 04/28/2016) 30 capsule 0  . ondansetron (ZOFRAN ODT) 8 MG disintegrating tablet Take 1 tablet (8 mg total) by mouth every 8 (eight) hours as needed for nausea or vomiting. (Patient not taking: Reported on 04/28/2016) 20 tablet 0   No current facility-administered medications for this visit.     Patient confirms/reports the following allergies:  Allergies  Allergen Reactions  . Simvastatin Other (See Comments)    Muscle aches    No orders of the defined types were placed in this encounter.   AUTHORIZATION INFORMATION Primary Insurance: MEDICAID  ID #: [id number]  Group #:  Pre-Cert / Auth required:  Pre-Cert / Auth #:    SCHEDULE INFORMATION: Procedure has been scheduled as follows:  Date:, Time:  Location:   This Gastroenterology Pre-Precedure Review Form is being routed to the following provider(s):

## 2016-04-29 ENCOUNTER — Telehealth: Payer: Self-pay

## 2016-04-29 ENCOUNTER — Encounter: Payer: Self-pay | Admitting: "Endocrinology

## 2016-04-29 ENCOUNTER — Ambulatory Visit (INDEPENDENT_AMBULATORY_CARE_PROVIDER_SITE_OTHER): Payer: Medicaid Other | Admitting: "Endocrinology

## 2016-04-29 ENCOUNTER — Other Ambulatory Visit: Payer: Self-pay

## 2016-04-29 VITALS — BP 137/84 | HR 102 | Ht 65.0 in | Wt 242.0 lb

## 2016-04-29 DIAGNOSIS — Z1211 Encounter for screening for malignant neoplasm of colon: Secondary | ICD-10-CM

## 2016-04-29 DIAGNOSIS — E669 Obesity, unspecified: Secondary | ICD-10-CM

## 2016-04-29 DIAGNOSIS — E890 Postprocedural hypothyroidism: Secondary | ICD-10-CM

## 2016-04-29 DIAGNOSIS — C73 Malignant neoplasm of thyroid gland: Secondary | ICD-10-CM

## 2016-04-29 DIAGNOSIS — IMO0001 Reserved for inherently not codable concepts without codable children: Secondary | ICD-10-CM

## 2016-04-29 DIAGNOSIS — I10 Essential (primary) hypertension: Secondary | ICD-10-CM

## 2016-04-29 MED ORDER — PEG 3350-KCL-NA BICARB-NACL 420 G PO SOLR: 4000.0000 mL | ORAL | 0 refills | Status: DC

## 2016-04-29 MED ORDER — LEVOTHYROXINE SODIUM 125 MCG PO TABS: 125.0000 ug | ORAL_TABLET | Freq: Every day | ORAL | 6 refills | Status: DC

## 2016-04-29 NOTE — Telephone Encounter (Signed)
Open in error

## 2016-04-29 NOTE — Progress Notes (Signed)
Subjective:    Patient ID: [name], female    DOB: March 04, 1955,    Past Medical History:  Diagnosis Date  . Depression   . Hyperlipidemia   . Hypertension   . Mental retardation, mild (I.Q. 50-70)   . Obesity   . Seizure ([place])    11/17/2012 "still having them; "I believe their from a tumor in my head" ((11/17/2012  . Seizure disorder ([place])   . Thyroid cancer [facility])    Past Surgical History:  Procedure Laterality Date  . ABDOMINAL HYSTERECTOMY  2001  . BREAST LUMPECTOMY  1996   right   . THYROIDECTOMY Bilateral 11/17/2012   Procedure: TOTAL THYROIDECTOMY;  Surgeon: Ascencion Dike, MD;  Location: [place];  Service: ENT;  Laterality: Bilateral;  . TOTAL THYROIDECTOMY Bilateral 11/17/2012  . TUBAL LIGATION     Social History   Social History  . Marital status: Single    Spouse name: N/A  . Number of children: 2  . Years of education: N/A   Occupational History  . disabled     Social History Main Topics  . Smoking status: Never Smoker  . Smokeless tobacco: Never Used  . Alcohol use No  . Drug use: No  . Sexual activity: No   Other Topics Concern  . None   Social History Narrative  . None   Outpatient Encounter Prescriptions as of 04/29/2016  Medication Sig  . acetaminophen (TYLENOL) 500 MG tablet Take 1 tablet (500 mg total) by mouth 2 (two) times daily.  . benazepril (LOTENSIN) 40 MG tablet TAKE (1) TABLET BY MOUTH DAILY.  . calcium-vitamin D (OSCAL WITH D) 500-200 MG-UNIT per tablet Take 1 tablet by mouth 2 (two) times daily.  . ergocalciferol (VITAMIN D2) 50000 UNITS capsule Take 50,000 Units by mouth once a week.  Marland Kitchen FLUoxetine (PROZAC) 10 MG capsule TAKE (1) CAPSULE BY MOUTH ONCE DAILY.  . hydrochlorothiazide (HYDRODIURIL) 25 MG tablet TAKE ONE TABLET BY MOUTH ONCE DAILY.  Marland Kitchen lamoTRIgine (LAMICTAL) 100 MG tablet Take 100 mg by mouth daily.  Marland Kitchen levETIRAcetam (KEPPRA) 750 MG tablet Take 1,500 mg by mouth 2 (two) times daily.   Marland Kitchen levothyroxine (SYNTHROID,  LEVOTHROID) 125 MCG tablet Take 1 tablet (125 mcg total) by mouth daily before breakfast.  . loratadine (CLARITIN) 10 MG tablet TAKE ONE TABLET BY MOUTH ONCE DAILY.  Marland Kitchen omeprazole (PRILOSEC) 20 MG capsule TAKE 2 CAPSULES BY MOUTH ONCE DAILY.  Marland Kitchen phenazopyridine (PYRIDIUM) 200 MG tablet Take 1 tablet (200 mg total) by mouth 3 (three) times daily.  . rosuvastatin (CRESTOR) 20 MG tablet TAKE 1 TABLET BY MOUTH ONCE A DAY.  . [DISCONTINUED] cephALEXin (KEFLEX) 500 MG capsule Take 1 capsule (500 mg total) by mouth 3 (three) times daily. (Patient not taking: Reported on 04/28/2016)  . [DISCONTINUED] levothyroxine (SYNTHROID, LEVOTHROID) 112 MCG tablet TAKE 1 TABLET BY MOUTH DAILY BEFORE BREAKFAST.  . [DISCONTINUED] ondansetron (ZOFRAN ODT) 8 MG disintegrating tablet Take 1 tablet (8 mg total) by mouth every 8 (eight) hours as needed for nausea or vomiting. (Patient not taking: Reported on 04/28/2016)   No facility-administered encounter medications on file as of 04/29/2016.    ALLERGIES: Allergies  Allergen Reactions  . Simvastatin Other (See Comments)    Muscle aches   VACCINATION STATUS: Immunization History  Administered Date(s) Administered  . H1N1 12/16/2007  . Influenza Split 11/24/2011  . Influenza Whole 11/30/2006, 12/07/2008, 12/26/2009  . Influenza,inj,Quad PF,36+ Mos 11/18/2012, 12/06/2013, 05/23/2015, 01/29/2016  . Pneumococcal Conjugate-13  04/03/2014  . Td 08/22/2003  . Tdap 04/02/2016    HPI  61 yr old female with medical hx as above . she is here to f/u for post surgical hypothyroidism and for f/u of Papillary  thyroid cancer. 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 She has had  Thyrogen stimulated remnant ablation with negative post therapy WBS in 2014.    She is now on Synthroid 112 g by mouth every morning.  - An attempt to biopsy the left cervical 0.9 cm lymph node was abandoned  because " no evidence of adenopathy". - Second Thyrogen estimated or body scan for surveillance shows no scintigraphic evidence of iodine avid metastasis papillary thyroid cancer on 04/18/2016. -She is compliant  to her thyroid hormone ,denies any new complaints.  - Medicaid denied coverage for surveillance thyroid /neck ultrasound. she denies palpitations. she denies dysphagia, SOB, nor voice change. she denies family hx of thyroid cancer.  Review of Systems  Constitutional: Positive for fatigue. Negative for unexpected weight change.  HENT: Negative for trouble swallowing and voice change.   Eyes: Negative for visual disturbance.  Respiratory: Negative for cough, shortness of breath and wheezing.   Cardiovascular: Negative for chest pain, palpitations and leg swelling.  Gastrointestinal: Negative for diarrhea, nausea and vomiting.  Endocrine: Negative for cold intolerance, heat intolerance, polydipsia, polyphagia and polyuria.  Musculoskeletal: Negative for arthralgias and myalgias.  Skin: Negative for color change, pallor, rash and wound.  Neurological: Negative for seizures and headaches.  Psychiatric/Behavioral: Negative for confusion and suicidal ideas.    Objective:    BP 137/84   Pulse (!) 102   Ht 5\' 5"  (1.651 m)   Wt 242 lb (109.8 kg)   BMI 40.27 kg/m   Wt Readings from Last 3 Encounters:  04/29/16 242 lb (109.8 kg)  04/05/16 246 lb (111.6 kg)  04/02/16 246 lb (111.6 kg)    Physical Exam  Constitutional: She is oriented to person, place, and time. She appears well-developed.  HENT:  Head: Normocephalic and atraumatic.  Eyes: EOM are normal.  Neck: Normal range of motion. Neck supple. No tracheal deviation present.  Post thyroidectomy scar, no gross mass lesion on PE.  Cardiovascular: Normal rate and regular rhythm.   Pulmonary/Chest: Effort normal and breath sounds normal.  Abdominal: Soft. Bowel sounds are normal. There is no tenderness. There is no guarding.   Musculoskeletal: Normal range of motion. She exhibits no edema.  Neurological: She is alert and oriented to person, place, and time. She has normal reflexes. No cranial nerve deficit. Coordination normal.  Skin: Skin is warm and dry. No rash noted. No erythema. No pallor.  Psychiatric: She has a normal mood and affect. Judgment normal.    Results for orders placed or performed in visit on 04/22/16  TSH  Result Value Ref Range   TSH 6.63 (H) mIU/L  T4, free  Result Value Ref Range   Free T4 1.5 0.8 - 1.8 ng/dL  Thyroglobulin antibody  Result Value Ref Range   Thyroglobulin Ab 1 <2 IU/mL  Thyroglobulin Level  Result Value Ref Range   Thyroglobulin 2.7 (L) ng/mL   Complete Blood Count (Most recent): Lab Results  Component Value Date   WBC 5.5 04/06/2016   HGB 11.5 (L) 04/06/2016   HCT 34.6 (L) 04/06/2016   MCV 85.2 04/06/2016   PLT 221 04/06/2016   Chemistry (most recent): Lab Results  Component Value Date  NA 136 04/06/2016   K 3.6 04/06/2016   CL 102 04/06/2016   CO2 25 04/06/2016   BUN 22 (H) 04/06/2016   CREATININE 1.37 (H) 04/06/2016   Diabetic Labs (most recent): Lab Results  Component Value Date   HGBA1C 5.3 08/10/2012   Lipid Panel     Component Value Date/Time   CHOL 187 01/29/2016 1109   TRIG 72 01/29/2016 1109   HDL 50 (L) 01/29/2016 1109   CHOLHDL 3.7 01/29/2016 1109   VLDL 14 01/29/2016 1109   LDLCALC 123 (H) 01/29/2016 1109       Assessment & Plan:   1. Hypothyroidism, postsurgical -Based on her most recent thyroid function tests, her doses need adjustment. I will Increase her levothyroxine to 125 g by mouth every morning.  - We discussed about correct intake of levothyroxine, at fasting, with water, separated by at least 30 minutes from breakfast, and separated by more than 4 hours from calcium, iron, multivitamins, acid reflux medications (PPIs). -Patient is made aware of the fact that thyroid hormone replacement is needed for life, dose  to be adjusted by periodic monitoring of thyroid function tests.   2. Papillary thyroid carcinoma ([place]) Her  surveillance neck /thyroid u/s from 11/17/2013 was c/w surgically absent thyroid, however there is a 0.9 cm left cervical lymph node. See below.  Pt is s/p thyrogen stimulated thyroid remnant ablation with WBS on 01/14/2013 with no evidence of iodine avid metastasis. She has had pT2,Nx,Mx FVPTC, s/p near total thyroidectomy on September 24,2014. She is counseled to maintain high degree of compliance with synthroid therapy with aim of keeping TSH low normal ( 0.1-0.5).  -she is advised on the necessity of follow-ups and imaging studies at least one annually for the next 5 years. - She was sent for core biopsy of 0.9 cm left cervical lymph node last visit. However, this procedure was abandoned due to " no evidence of adenopathy".  The previously noticed  left cervical lymph node was sent to decrease in size to 0.5 cm. Medicaid did not cover surveillance ultrasound. - Second Thyrogen estimated or body scan for surveillance shows no scintigraphic evidence of iodine avid metastasis papillary thyroid cancer on 04/18/2016.  3. Essential hypertension - Controlled, patient is advised to continue Benazepril  20 mg by mouth daily and hydrochlorothiazide 25 mg by mouth daily.  4. Obesity, Class II, BMI 35.0-39.9, with comorbidity (see actual BMI) ([place]) Patient is provided with weight loss advice- limit carbohydrate intake,  increase protein intake in exercise.  Follow up plan: Return in about 6 months (around 10/30/2016) for follow up with pre-visit labs.  Glade Lloyd, MD Phone: [phone]  Fax: [phone]   04/29/2016, 9:35 AM

## 2016-04-29 NOTE — Telephone Encounter (Signed)
Ok to schedule.

## 2016-04-29 NOTE — Telephone Encounter (Signed)
Pt is aet up for 05/16/16 @ 8:30 am with [facility]. NO PA is needed. Pt is aware and instructions are in the mail.

## 2016-05-16 ENCOUNTER — Encounter (HOSPITAL_COMMUNITY): Payer: Self-pay | Admitting: *Deleted

## 2016-05-16 ENCOUNTER — Encounter (HOSPITAL_COMMUNITY)
Admission: RE | Disposition: A | Discharge status: Home Health | Payer: Self-pay | Source: Ambulatory Visit | Attending: Gastroenterology

## 2016-05-16 ENCOUNTER — Ambulatory Visit (HOSPITAL_COMMUNITY)
Admission: RE | Admit: 2016-05-16 | Discharge: 2016-05-16 | Disposition: A | Discharge status: Home Health | Payer: Medicaid Other | Source: Ambulatory Visit | Attending: Gastroenterology | Admitting: Gastroenterology

## 2016-05-16 DIAGNOSIS — Z1212 Encounter for screening for malignant neoplasm of rectum: Secondary | ICD-10-CM

## 2016-05-16 DIAGNOSIS — Z85850 Personal history of malignant neoplasm of thyroid: Secondary | ICD-10-CM | POA: Diagnosis not present

## 2016-05-16 DIAGNOSIS — K648 Other hemorrhoids: Secondary | ICD-10-CM | POA: Diagnosis not present

## 2016-05-16 DIAGNOSIS — Z1211 Encounter for screening for malignant neoplasm of colon: Secondary | ICD-10-CM | POA: Diagnosis not present

## 2016-05-16 DIAGNOSIS — I10 Essential (primary) hypertension: Secondary | ICD-10-CM | POA: Diagnosis not present

## 2016-05-16 DIAGNOSIS — Q438 Other specified congenital malformations of intestine: Secondary | ICD-10-CM | POA: Insufficient documentation

## 2016-05-16 DIAGNOSIS — E785 Hyperlipidemia, unspecified: Secondary | ICD-10-CM | POA: Diagnosis not present

## 2016-05-16 DIAGNOSIS — Z6841 Body Mass Index (BMI) 40.0 and over, adult: Secondary | ICD-10-CM | POA: Diagnosis not present

## 2016-05-16 DIAGNOSIS — F329 Major depressive disorder, single episode, unspecified: Secondary | ICD-10-CM | POA: Diagnosis not present

## 2016-05-16 DIAGNOSIS — Z79899 Other long term (current) drug therapy: Secondary | ICD-10-CM | POA: Diagnosis not present

## 2016-05-16 DIAGNOSIS — G40909 Epilepsy, unspecified, not intractable, without status epilepticus: Secondary | ICD-10-CM | POA: Diagnosis not present

## 2016-05-16 DIAGNOSIS — E669 Obesity, unspecified: Secondary | ICD-10-CM | POA: Insufficient documentation

## 2016-05-16 HISTORY — PX: COLONOSCOPY: SHX5424

## 2016-05-16 SURGERY — COLONOSCOPY
Anesthesia: Moderate Sedation

## 2016-05-16 MED ORDER — MEPERIDINE HCL 100 MG/ML IJ SOLN
INTRAMUSCULAR | Status: DC | PRN
  Administered 2016-05-16: 50 mg
  Administered 2016-05-16 (×2): 25 mg

## 2016-05-16 MED ORDER — MEPERIDINE HCL 100 MG/ML IJ SOLN
INTRAMUSCULAR | Status: AC
  Filled 2016-05-16: qty 2

## 2016-05-16 MED ORDER — STERILE WATER FOR IRRIGATION IR SOLN
Status: DC | PRN
  Administered 2016-05-16: 2.5 mL

## 2016-05-16 MED ORDER — SODIUM CHLORIDE 0.9 % IV SOLN
INTRAVENOUS | Status: DC
  Administered 2016-05-16: 1000 mL via INTRAVENOUS

## 2016-05-16 MED ORDER — MIDAZOLAM HCL 5 MG/5ML IJ SOLN
INTRAMUSCULAR | Status: DC | PRN
  Administered 2016-05-16 (×3): 2 mg via INTRAVENOUS
  Administered 2016-05-16: 1 mg via INTRAVENOUS

## 2016-05-16 MED ORDER — MIDAZOLAM HCL 5 MG/5ML IJ SOLN
INTRAMUSCULAR | Status: AC
  Filled 2016-05-16: qty 10

## 2016-05-16 NOTE — Discharge Instructions (Signed)
YOU DID NOT HAVE ANY POLYPS.  You have SMALL internal AND MODERATE EXTERNAL hemorrhoids.    CONTINUE YOUR WEIGHT LOSS EFFORTS. Lose 20 pounds.  WHILE I DO NOT WANT TO ALARM YOU, YOUR BODY MASS INDEX IS OVER 30 WHICH MEANS YOU ARE OBESE. Obesity CAN TURN ON on cancer genes. OBESITY IS ASSOCIATED WITH AN INCREASE RISK FOR ALL CANCERS, INCLUDING ESOPHAGEAL AND COLON CANCER.  DRINK WATER TO KEEP YOUR URINE LIGHT YELLOW.  FOLLOW A HIGH FIBER DIET. AVOID ITEMS THAT CAUSE BLOATING & GAS. SEE INFO BELOW.  Next colonoscopy in 10 years.   Colonoscopy Care After Read the instructions outlined below and refer to this sheet in the next week. These discharge instructions provide you with general information on caring for yourself after you leave the hospital. While your treatment has been planned according to the most current medical practices available, unavoidable complications occasionally occur. If you have any problems or questions after discharge, call DR. Mishael Haran, [phone].  ACTIVITY  You may resume your regular activity, but move at a slower pace for the next 24 hours.   Take frequent rest periods for the next 24 hours.   Walking will help get rid of the air and reduce the bloated feeling in your belly (abdomen).   No driving for 24 hours (because of the medicine (anesthesia) used during the test).   You may shower.   Do not sign any important legal documents or operate any machinery for 24 hours (because of the anesthesia used during the test).    NUTRITION  Drink plenty of fluids.   You may resume your normal diet as instructed by your doctor.   Begin with a light meal and progress to your normal diet. Heavy or fried foods are harder to digest and may make you feel sick to your stomach (nauseated).   Avoid alcoholic beverages for 24 hours or as instructed.    MEDICATIONS  You may resume your normal medications.   WHAT YOU CAN EXPECT TODAY  Some feelings of bloating in  the abdomen.   Passage of more gas than usual.   Spotting of blood in your stool or on the toilet paper  .  IF YOU HAD POLYPS REMOVED DURING THE COLONOSCOPY:  Eat a soft diet IF YOU HAVE NAUSEA, BLOATING, ABDOMINAL PAIN, OR VOMITING.    FINDING OUT THE RESULTS OF YOUR TEST Not all test results are available during your visit. DR. Oneida Alar WILL CALL YOU WITHIN 14 DAYS OF YOUR PROCEDUE WITH YOUR RESULTS. Do not assume everything is normal if you have not heard from DR. Rexann Lueras, CALL HER OFFICE AT [phone].  SEEK IMMEDIATE MEDICAL ATTENTION AND CALL THE OFFICE: [phone] IF:  You have more than a spotting of blood in your stool.   Your belly is swollen (abdominal distention).   You are nauseated or vomiting.   You have a temperature over 101F.   You have abdominal pain or discomfort that is severe or gets worse throughout the day.   High-Fiber Diet A high-fiber diet changes your normal diet to include more whole grains, legumes, fruits, and vegetables. Changes in the diet involve replacing refined carbohydrates with unrefined foods. The calorie level of the diet is essentially unchanged. The Dietary Reference Intake (recommended amount) for adult males is 38 grams per day. For adult females, it is 25 grams per day. Pregnant and lactating women should consume 28 grams of fiber per day. Fiber is the intact part of a plant that is not  broken down during digestion. Functional fiber is fiber that has been isolated from the plant to provide a beneficial effect in the body. PURPOSE  Increase stool bulk.   Ease and regulate bowel movements.   Lower cholesterol.   REDUCE RISK OF COLON CANCER  INDICATIONS THAT YOU NEED MORE FIBER  Constipation and hemorrhoids.   Uncomplicated diverticulosis (intestine condition) and irritable bowel syndrome.   Weight management.   As a protective measure against hardening of the arteries (atherosclerosis), diabetes, and cancer.   GUIDELINES  FOR INCREASING FIBER IN THE DIET  Start adding fiber to the diet slowly. A gradual increase of about 5 more grams (2 slices of whole-wheat bread, 2 servings of most fruits or vegetables, or 1 bowl of high-fiber cereal) per day is best. Too rapid an increase in fiber may result in constipation, flatulence, and bloating.   Drink enough water and fluids to keep your urine clear or pale yellow. Water, juice, or caffeine-free drinks are recommended. Not drinking enough fluid may cause constipation.   Eat a variety of high-fiber foods rather than one type of fiber.   Try to increase your intake of fiber through using high-fiber foods rather than fiber pills or supplements that contain small amounts of fiber.   The goal is to change the types of food eaten. Do not supplement your present diet with high-fiber foods, but replace foods in your present diet.   INCLUDE A VARIETY OF FIBER SOURCES  Replace refined and processed grains with whole grains, canned fruits with fresh fruits, and incorporate other fiber sources. White rice, white breads, and most bakery goods contain little or no fiber.   Brown whole-grain rice, buckwheat oats, and many fruits and vegetables are all good sources of fiber. These include: broccoli, Brussels sprouts, cabbage, cauliflower, beets, sweet potatoes, white potatoes (skin on), carrots, tomatoes, eggplant, squash, berries, fresh fruits, and dried fruits.   Cereals appear to be the richest source of fiber. Cereal fiber is found in whole grains and bran. Bran is the fiber-rich outer coat of cereal grain, which is largely removed in refining. In whole-grain cereals, the bran remains. In breakfast cereals, the largest amount of fiber is found in those with "bran" in their names. The fiber content is sometimes indicated on the label.   You may need to include additional fruits and vegetables each day.   In baking, for 1 cup white flour, you may use the following substitutions:    1 cup whole-wheat flour minus 2 tablespoons.   1/2 cup white flour plus 1/2 cup whole-wheat flour.   Polyps, Colon  A polyp is extra tissue that grows inside your body. Colon polyps grow in the large intestine. The large intestine, also called the colon, is part of your digestive system. It is a long, hollow tube at the end of your digestive tract where your body makes and stores stool. Most polyps are not dangerous. They are benign. This means they are not cancerous. But over time, some types of polyps can turn into cancer. Polyps that are smaller than a pea are usually not harmful. But larger polyps could someday become or may already be cancerous. To be safe, doctors remove all polyps and test them.   PREVENTION There is not one sure way to prevent polyps. You might be able to lower your risk of getting them if you:  Eat more fruits and vegetables and less fatty food.   Do not smoke.   Avoid alcohol.   Exercise  every day.   Lose weight if you are overweight.   Eating more calcium and folate can also lower your risk of getting polyps. Some foods that are rich in calcium are milk, cheese, and broccoli. Some foods that are rich in folate are chickpeas, kidney beans, and spinach.   Hemorrhoids Hemorrhoids are dilated (enlarged) veins around the rectum. Sometimes clots will form in the veins. This makes them swollen and painful. These are called thrombosed hemorrhoids. Causes of hemorrhoids include:  Constipation.   Straining to have a bowel movement.   HEAVY LIFTING  HOME CARE INSTRUCTIONS  Eat a well balanced diet and drink 6 to 8 glasses of water every day to avoid constipation. You may also use a bulk laxative.   Avoid straining to have bowel movements.   Keep anal area dry and clean.   Do not use a donut shaped pillow or sit on the toilet for long periods. This increases blood pooling and pain.   Move your bowels when your body has the urge; this will require less  straining and will decrease pain and pressure.

## 2016-05-16 NOTE — H&P (Signed)
Primary Care Physician:  Tula Nakayama, MD Primary Gastroenterologist:  Dr. Oneida Alar  Pre-Procedure History & Physical: HPI:  [name] is a 61 y.o. female here for [place].  Past Medical History:  Diagnosis Date  . Depression   . Hyperlipidemia   . Hypertension   . Mental retardation, mild (I.Q. 50-70)   . Obesity   . Seizure ([place])    11/17/2012 "still having them; "I believe their from a tumor in my head" ((11/17/2012  . Seizure disorder ([place])   . Thyroid cancer [facility])     Past Surgical History:  Procedure Laterality Date  . ABDOMINAL HYSTERECTOMY  2001  . BREAST LUMPECTOMY  1996   right   . THYROIDECTOMY Bilateral 11/17/2012   Procedure: TOTAL THYROIDECTOMY;  Surgeon: Ascencion Dike, MD;  Location: [place];  Service: ENT;  Laterality: Bilateral;  . TOTAL THYROIDECTOMY Bilateral 11/17/2012  . TUBAL LIGATION      Prior to Admission medications   Medication Sig Start Date End Date Taking? Authorizing Provider  acetaminophen (TYLENOL) 500 MG tablet Take 1 tablet (500 mg total) by mouth 2 (two) times daily. 08/15/14  Yes [name], MD  benazepril (LOTENSIN) 40 MG tablet TAKE (1) TABLET BY MOUTH DAILY. 03/19/16  Yes [name], MD  ergocalciferol (VITAMIN D2) 50000 UNITS capsule Take 50,000 Units by mouth once a week.   Yes Historical Provider, MD  FLUoxetine (PROZAC) 10 MG capsule TAKE (1) CAPSULE BY MOUTH ONCE DAILY. 03/19/16  Yes [name], MD  hydrochlorothiazide (HYDRODIURIL) 25 MG tablet TAKE ONE TABLET BY MOUTH ONCE DAILY. 03/19/16  Yes [name], MD  lamoTRIgine (LAMICTAL) 100 MG tablet Take 100 mg by mouth daily.   Yes Historical Provider, MD  levETIRAcetam (KEPPRA) 750 MG tablet Take 1,500 mg by mouth 2 (two) times daily.    Yes Historical Provider, MD  levothyroxine (SYNTHROID, LEVOTHROID) 125 MCG tablet Take 1 tablet (125 mcg total) by mouth daily before breakfast. 04/29/16  Yes Cassandria Anger, MD  loratadine  (CLARITIN) 10 MG tablet TAKE ONE TABLET BY MOUTH ONCE DAILY. 03/19/16  Yes [name], MD  omeprazole (PRILOSEC) 20 MG capsule TAKE 2 CAPSULES BY MOUTH ONCE DAILY. 03/19/16  Yes [name], MD  phenazopyridine (PYRIDIUM) 200 MG tablet Take 1 tablet (200 mg total) by mouth 3 (three) times daily. 04/06/16  Yes Rolland Porter, MD  polyethylene glycol-electrolytes (TRILYTE) 420 g solution Take 4,000 mLs by mouth as directed. 04/29/16  Yes Danie Binder, MD  rosuvastatin (CRESTOR) 20 MG tablet TAKE 1 TABLET BY MOUTH ONCE A DAY. 03/19/16  Yes [name], MD  calcium-vitamin D (OSCAL WITH D) 500-200 MG-UNIT per tablet Take 1 tablet by mouth 2 (two) times daily. 08/04/13   [name], MD    Allergies as of 04/29/2016 - Review Complete 04/29/2016  Allergen Reaction Noted  . Simvastatin Other (See Comments) 05/23/2015    Family History  Problem Relation Age of Onset  . Diabetes Mother     Social History   Social History  . Marital status: Single    Spouse name: N/A  . Number of children: 2  . Years of education: N/A   Occupational History  . disabled     Social History Main Topics  . Smoking status: Never Smoker  . Smokeless tobacco: Never Used  . Alcohol use No  . Drug use: No  . Sexual activity: No   Other Topics Concern  .  Not on file   Social History Narrative  . No narrative on file    Review of Systems: See HPI, otherwise negative ROS   Physical Exam: BP 138/79   Pulse (!) 58   Temp 98.9 F (37.2 C) (Oral)   Resp 15   Ht 5\' 5"  (1.651 m)   Wt 242 lb (109.8 kg)   SpO2 100%   BMI 40.27 kg/m  General:   Alert,  pleasant and cooperative in NAD Head:  Normocephalic and atraumatic. Neck:  Supple; Lungs:  Clear throughout to auscultation.    Heart:  Regular rate and rhythm. Abdomen:  Soft, nontender and nondistended. Normal bowel sounds, without guarding, and without rebound.   Neurologic:  Alert and  oriented x4;  grossly normal  neurologically.  Impression/Plan:     SCREENING  Plan:  1. TCS TODAY. DISCUSSED PROCEDURE, BENEFITS, & RISKS: < 1% chance of medication reaction, bleeding, perforation, or rupture of spleen/liver.

## 2016-05-16 NOTE — Op Note (Signed)
[facility] Patient Name: [name] Procedure Date: 05/16/2016 8:27 AM MRN: [id number] Date of Birth: 09/14/1955 Attending MD: Barney Drain , MD CSN: [id number] Age: 61 Admit Type: Outpatient Procedure:                Colonoscopy, SCREENING Indications:              Screening for colorectal malignant neoplasm Providers:                Barney Drain, MD, Janeece Riggers, RN, Charlyne Petrin                            RN, RN Referring MD:             Norwood Levo. Simpson MD, MD Medicines:                Meperidine 100 mg IV, Midazolam 7 mg IV Complications:            No immediate complications. Estimated Blood Loss:     Estimated blood loss: none. Procedure:                Pre-Anesthesia Assessment:                           - Prior to the procedure, a History and Physical                            was performed, and patient medications and                            allergies were reviewed. The patient's tolerance of                            previous anesthesia was also reviewed. The risks                            and benefits of the procedure and the sedation                            options and risks were discussed with the patient.                            All questions were answered, and informed consent                            was obtained. Prior Anticoagulants: The patient has                            taken no previous anticoagulant or antiplatelet                            agents. ASA Grade Assessment: II - A patient with                            mild systemic disease. After reviewing the risks  and benefits, the patient was deemed in                            satisfactory condition to undergo the procedure.                            After obtaining informed consent, the colonoscope                            was passed under direct vision. Throughout the                            procedure, the patient's blood pressure, pulse,  and                            oxygen saturations were monitored continuously. The                            EC-[id number] ([id number]) scope was introduced through                            the anus and advanced to the the cecum, identified                            by appendiceal orifice and ileocecal valve. The                            colonoscopy was somewhat difficult due to a                            tortuous colon and the patient's agitation.                            Successful completion of the procedure was aided by                            increasing the dose of sedation medication,                            straightening and shortening the scope to obtain                            bowel loop reduction and COLOWRAP. The patient                            tolerated the procedure fairly well. The quality of                            the bowel preparation was excellent. The ileocecal                            valve, appendiceal orifice, and rectum were  photographed. Scope In: 9:11:58 AM Scope Out: 9:26:09 AM Total Procedure Duration: 0 hours 14 minutes 11 seconds  Findings:      The recto-sigmoid colon, sigmoid colon and descending colon were       moderately redundant.      The exam was otherwise without abnormality.      Internal hemorrhoids were found during retroflexion. The hemorrhoids       were small. Impression:               - Redundant LEFT colon.                           - The examination was otherwise normal.                           - Internal hemorrhoids. Moderate Sedation:      Moderate (conscious) sedation was administered by the endoscopy nurse       and supervised by the endoscopist. The following parameters were       monitored: oxygen saturation, heart rate, blood pressure, and response       to care. Total physician intraservice time was 29 minutes. Recommendation:           - High fiber diet.                            - Continue present medications.                           - Repeat colonoscopy in 10 years for surveillance.                           - Patient has a contact number available for                            emergencies. The signs and symptoms of potential                            delayed complications were discussed with the                            patient. Return to normal activities tomorrow.                            Written discharge instructions were provided to the                            patient. Procedure Code(s):        --- Professional ---                           [phone], Colonoscopy, flexible; diagnostic, including                            collection of specimen(s) by brushing or washing,                            when performed (separate procedure)  [id number], Moderate sedation services provided by the                            same physician or other qualified health care                            professional performing the diagnostic or                            therapeutic service that the sedation supports,                            requiring the presence of an independent trained                            observer to assist in the monitoring of the                            patient's level of consciousness and physiological                            status; initial 15 minutes of intraservice time,                            patient age 62 years or older                           [phone], Moderate sedation services; each additional                            15 minutes intraservice time Diagnosis Code(s):        --- Professional ---                           Z12.11, Encounter for screening for malignant                            neoplasm of colon                           K64.8, Other hemorrhoids                           Q43.8, Other specified congenital malformations of                            intestine CPT copyright 2016  American Medical Association. All rights reserved. The codes documented in this report are preliminary and upon coder review may  be revised to meet current compliance requirements. Barney Drain, MD Barney Drain, MD 05/16/2016 9:48:42 AM This report has been signed electronically. Number of Addenda: 0

## 2016-05-19 ENCOUNTER — Encounter (HOSPITAL_COMMUNITY): Payer: Self-pay | Admitting: Gastroenterology

## 2016-05-30 DIAGNOSIS — I10 Essential (primary) hypertension: Secondary | ICD-10-CM | POA: Diagnosis not present

## 2016-05-30 DIAGNOSIS — F78 Other intellectual disabilities: Secondary | ICD-10-CM | POA: Diagnosis not present

## 2016-05-30 DIAGNOSIS — R569 Unspecified convulsions: Secondary | ICD-10-CM | POA: Diagnosis not present

## 2016-06-06 ENCOUNTER — Other Ambulatory Visit (HOSPITAL_COMMUNITY): Payer: Self-pay | Admitting: Nephrology

## 2016-06-06 DIAGNOSIS — N183 Chronic kidney disease, stage 3 unspecified: Secondary | ICD-10-CM

## 2016-06-07 ENCOUNTER — Emergency Department (HOSPITAL_COMMUNITY): Payer: Medicaid Other

## 2016-06-07 ENCOUNTER — Encounter (HOSPITAL_COMMUNITY): Payer: Self-pay | Admitting: *Deleted

## 2016-06-07 ENCOUNTER — Emergency Department (HOSPITAL_COMMUNITY)
Admission: EM | Admit: 2016-06-07 | Discharge: 2016-06-07 | Disposition: A | Discharge status: Home / Self Care | Payer: Medicaid Other | Attending: Emergency Medicine | Admitting: Emergency Medicine

## 2016-06-07 DIAGNOSIS — Z79899 Other long term (current) drug therapy: Secondary | ICD-10-CM | POA: Diagnosis not present

## 2016-06-07 DIAGNOSIS — I129 Hypertensive chronic kidney disease with stage 1 through stage 4 chronic kidney disease, or unspecified chronic kidney disease: Secondary | ICD-10-CM | POA: Insufficient documentation

## 2016-06-07 DIAGNOSIS — S92355A Nondisplaced fracture of fifth metatarsal bone, left foot, initial encounter for closed fracture: Secondary | ICD-10-CM | POA: Insufficient documentation

## 2016-06-07 DIAGNOSIS — E039 Hypothyroidism, unspecified: Secondary | ICD-10-CM | POA: Diagnosis not present

## 2016-06-07 DIAGNOSIS — W1839XA Other fall on same level, initial encounter: Secondary | ICD-10-CM | POA: Diagnosis not present

## 2016-06-07 DIAGNOSIS — Y999 Unspecified external cause status: Secondary | ICD-10-CM | POA: Diagnosis not present

## 2016-06-07 DIAGNOSIS — Y929 Unspecified place or not applicable: Secondary | ICD-10-CM | POA: Diagnosis not present

## 2016-06-07 DIAGNOSIS — N183 Chronic kidney disease, stage 3 (moderate): Secondary | ICD-10-CM | POA: Diagnosis not present

## 2016-06-07 DIAGNOSIS — S99922A Unspecified injury of left foot, initial encounter: Secondary | ICD-10-CM | POA: Diagnosis present

## 2016-06-07 DIAGNOSIS — Y939 Activity, unspecified: Secondary | ICD-10-CM | POA: Insufficient documentation

## 2016-06-07 MED ORDER — HYDROCODONE-ACETAMINOPHEN 5-325 MG PO TABS
1.0000 | ORAL_TABLET | Freq: Once | ORAL | Status: AC
  Administered 2016-06-07: 1 via ORAL
  Filled 2016-06-07: qty 1

## 2016-06-07 MED ORDER — HYDROCODONE-ACETAMINOPHEN 5-325 MG PO TABS: 1.0000 | ORAL_TABLET | ORAL | 0 refills | Status: DC | PRN

## 2016-06-07 NOTE — ED Notes (Signed)
Pt made aware to return if symptoms worsen or if any life threatening symptoms occur.   

## 2016-06-07 NOTE — Discharge Instructions (Addendum)
Rest and ice your foot to help with pain and swelling.  Wear the shoe we gave you whenever you are standing and walking. You may take the hydrocodone prescribed for pain relief.  This will make you drowsy - do not drive within 4 hours of taking this medication.  This medicine contains tylenol 325 mg's, so do not take any additional tylenol when using this medicine.

## 2016-06-07 NOTE — ED Provider Notes (Signed)
[facility] DEPT Provider Note   CSN: [id number] Arrival date & time: 06/07/16  2841  By signing my name below, I, Collene Leyden, attest that this documentation has been prepared under the direction and in the presence of . Electronically Signed: Collene Leyden, Scribe. 06/07/16. 8:40 AM.  History   Chief Complaint Chief Complaint  Patient presents with  . Fall   HPI Comments: [name] is a 61 y.o. female with a history of mental retardation, HTN, and HLD, who presents to the Emergency Department complaining of gradual-onset, constant left-sided foot pain s/p mechanical fall that happened two days ago. Patient states her feet became tangled underneath her, causing her to fall and land on her left side. Patient states her pain progressively worsened overnight. Patient states "I have pain in my veins". Patient reports associated left shoulder pain and resolved posterior neck pain. Patient reports applying ice to the areas with some relief. Patient denies any head injury, headache, LOC, back pain, hip pain, numbness, or weakness.   The history is provided by the patient. No language interpreter was used.    Past Medical History:  Diagnosis Date  . Depression   . Hyperlipidemia   . Hypertension   . Mental retardation, mild (I.Q. 50-70)   . Obesity   . Seizure ([place])    11/17/2012 "still having them; "I believe their from a tumor in my head" ((11/17/2012  . Seizure disorder ([place])   . Thyroid cancer [facility])     Patient Active Problem List   Diagnosis Date Noted  . CKD (chronic kidney disease) stage 3, GFR 30-59 ml/min 04/03/2016  . GERD (gastroesophageal reflux disease) 08/23/2015  . Osteoarthritis of right knee 08/15/2014  . Need for Tdap vaccination 04/10/2014  . Special screening for malignant neoplasms, colon 12/06/2013  . 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 Reduced vision 05/03/2013  . Papillary thyroid carcinoma ([place]) 04/12/2013  . Vitamin D deficiency 08/16/2009   . POLYNEUROPATHY 04/10/2009  . Hyperlipidemia LDL goal <100 07/15/2007  . Obesity, Class II, BMI 35.0-39.9, with comorbidity (see actual BMI) 07/15/2007  . Anxiety and depression 07/15/2007  . MENTAL RETARDATION, MILD 07/15/2007  . Essential hypertension 07/15/2007  . Convulsions ([place]) 07/15/2007    Past Surgical History:  Procedure Laterality Date  . ABDOMINAL HYSTERECTOMY  2001  . BREAST LUMPECTOMY  1996   right   . COLONOSCOPY N/A 05/16/2016   Procedure: COLONOSCOPY;  Surgeon: Danie Binder, MD;  Location: [facility];  Service: Endoscopy;  Laterality: N/A;  830   . THYROIDECTOMY Bilateral 11/17/2012   Procedure: TOTAL THYROIDECTOMY;  Surgeon: Ascencion Dike, MD;  Location: [facility];  Service: ENT;  Laterality: Bilateral;  . TOTAL THYROIDECTOMY Bilateral 11/17/2012  . TUBAL LIGATION      OB History    No data available       Home Medications    Prior to Admission medications   Medication Sig Start Date End Date Taking? Authorizing Provider  acetaminophen (TYLENOL) 500 MG tablet Take 1 tablet (500 mg total) by mouth 2 (two) times daily. Patient taking differently: Take 1,000 mg by mouth 2 (two) times daily.  08/15/14  Yes [name], MD  benazepril (LOTENSIN) 40 MG tablet TAKE (1) TABLET BY MOUTH DAILY. 03/19/16  Yes [name], MD  calcium-vitamin D (OSCAL WITH D) 500-200 MG-UNIT per tablet Take 1 tablet by mouth 2 (two) times daily. 08/04/13  Yes [name], MD  FLUoxetine (PROZAC) 10 MG capsule TAKE (1) CAPSULE BY  MOUTH ONCE DAILY. 03/19/16  Yes [name], MD  hydrochlorothiazide (HYDRODIURIL) 25 MG tablet TAKE ONE TABLET BY MOUTH ONCE DAILY. 03/19/16  Yes [name], MD  lamoTRIgine (LAMICTAL) 100 MG tablet Take 100 mg by mouth daily.   Yes Historical Provider, MD  levETIRAcetam (KEPPRA) 750 MG tablet Take 1,500 mg by mouth 2 (two) times daily.    Yes Historical Provider, MD  levothyroxine (SYNTHROID, LEVOTHROID) 125 MCG tablet Take 1  tablet (125 mcg total) by mouth daily before breakfast. 04/29/16  Yes Cassandria Anger, MD  loratadine (CLARITIN) 10 MG tablet TAKE ONE TABLET BY MOUTH ONCE DAILY. 03/19/16  Yes [name], MD  omeprazole (PRILOSEC) 20 MG capsule TAKE 2 CAPSULES BY MOUTH ONCE DAILY. 03/19/16  Yes [name], MD  phenazopyridine (PYRIDIUM) 200 MG tablet Take 1 tablet (200 mg total) by mouth 3 (three) times daily. 04/06/16  Yes Rolland Porter, MD  rosuvastatin (CRESTOR) 20 MG tablet TAKE 1 TABLET BY MOUTH ONCE A DAY. 03/19/16  Yes [name], MD  ergocalciferol (VITAMIN D2) 50000 UNITS capsule Take 50,000 Units by mouth once a week.    Historical Provider, MD  HYDROcodone-acetaminophen (NORCO/VICODIN) 5-325 MG tablet Take 1 tablet by mouth every 4 (four) hours as needed. 06/07/16   Evalee Jefferson, PA-C    Family History Family History  Problem Relation Age of Onset  . Diabetes Mother     Social History Social History  Substance Use Topics  . Smoking status: Never Smoker  . Smokeless tobacco: Never Used  . Alcohol use No     Allergies   Simvastatin   Review of Systems Review of Systems  HENT: Negative.   Respiratory: Negative.   Cardiovascular: Negative.   Musculoskeletal: Positive for arthralgias (left foot, left shoulder) and neck pain (resolved). Negative for back pain.  Neurological: Negative for syncope, weakness and numbness.     Physical Exam Updated Vital Signs BP (!) 143/78 (BP Location: Right Arm)   Pulse 76   Temp 98.5 F (36.9 C) (Oral)   Resp 18   Ht 5\' 5"  (1.651 m)   Wt 109.8 kg   SpO2 96%   BMI 40.27 kg/m   Physical Exam  Constitutional: She is oriented to person, place, and time. She appears well-developed and well-nourished.  HENT:  Head: Normocephalic and atraumatic.  Eyes: EOM are normal. Pupils are equal, round, and reactive to light.  Neck: Normal range of motion. Neck supple.  Cardiovascular: Normal rate, regular rhythm and intact distal pulses.   Exam reveals no decreased pulses.   Pulses:      Dorsalis pedis pulses are 2+ on the right side, and 2+ on the left side.       Posterior tibial pulses are 2+ on the right side, and 2+ on the left side.  Pulmonary/Chest: Effort normal and breath sounds normal.  Musculoskeletal: Normal range of motion. She exhibits tenderness. She exhibits no edema or deformity.       Left ankle: She exhibits swelling. She exhibits normal range of motion, no ecchymosis, no deformity and normal pulse. Tenderness. Lateral malleolus and head of 5th metatarsal tenderness found. No proximal fibula tenderness found. Achilles tendon normal.  Mild tenderness over the left mid deltoid without hematoma deformity or bruising. Full range of motion of the shoulder without worsening pain.   Neurological: She is alert and oriented to person, place, and time. No sensory deficit.  Skin: Skin is warm, dry and intact.  Psychiatric: She  has a normal mood and affect.  Nursing note and vitals reviewed.    ED Treatments / Results  DIAGNOSTIC STUDIES: Oxygen Saturation is 96% on RA, adequate by my interpretation.    COORDINATION OF CARE: 8:39 AM Discussed treatment plan with pt at bedside and pt agreed to plan, which includes imaging.   Labs (all labs ordered are listed, but only abnormal results are displayed) Labs Reviewed - No data to display  EKG  EKG Interpretation None       Radiology Dg Ankle Complete Left  Result Date: 06/07/2016 CLINICAL DATA:  61 year old female with history of trauma from a fall two days ago complaining of pain and swelling in the left ankle, most severe along the lateral aspect of the ankle. EXAM: LEFT ANKLE COMPLETE - 3+ VIEW COMPARISON:  No priors. FINDINGS: Soft tissue swelling overlying the lateral malleolus. No acute displaced fracture, subluxation or dislocation in the ankle. However, there is a nondisplaced fracture through the base of the fifth metatarsal. IMPRESSION: 1. Nondisplaced  fracture through the base of the fifth metatarsal with overlying soft tissue swelling, better demonstrated on accompanying foot radiograph. No acute abnormality of the ankle itself, with exception of soft tissue swelling extending overlying the lateral malleolus. Electronically Signed   By: Vinnie Langton M.D.   On: 06/07/2016 09:24   Dg Foot Complete Left  Result Date: 06/07/2016 CLINICAL DATA:  61 year old female with history of trauma from a fall 2 days ago complaining of pain and swelling overlying the lateral aspect of the left foot and ankle. EXAM: LEFT FOOT - COMPLETE 3+ VIEW COMPARISON:  No priors. FINDINGS: Nondisplaced mildly comminuted fracture through the base of the fifth metatarsal with overlying soft tissue swelling. No other acute displaced fracture, subluxation or dislocation is noted in the foot. IMPRESSION: 1. Nondisplaced mildly comminuted fracture through the base of the fifth metatarsal with overlying soft tissue swelling. Electronically Signed   By: Vinnie Langton M.D.   On: 06/07/2016 09:25    Procedures Procedures (including critical care time)  Medications Ordered in ED Medications  HYDROcodone-acetaminophen (NORCO/VICODIN) 5-325 MG per tablet 1 tablet (1 tablet Oral Given 06/07/16 0947)     Initial Impression / Assessment and Plan / ED Course  I have reviewed the triage vital signs and the nursing notes.  Pertinent labs & imaging results that were available during my care of the patient were reviewed by me and considered in my medical decision making (see chart for details).     Rest, ice, elevation, post op shoe, padded dressing.  f/u with Dr. Aline Brochure, referral given.   Final Clinical Impressions(s) / ED Diagnoses   Final diagnoses:  Closed nondisplaced fracture of fifth metatarsal bone of left foot, initial encounter    New Prescriptions New Prescriptions   HYDROCODONE-ACETAMINOPHEN (NORCO/VICODIN) 5-325 MG TABLET    Take 1 tablet by mouth every 4  (four) hours as needed.   I personally performed the services described in this documentation, which was scribed in my presence. The recorded information has been reviewed and is accurate.     Evalee Jefferson, PA-C 06/07/16 [place], PA-C 06/07/16 [id number]    Milton Ferguson, MD 06/07/16 [phone]

## 2016-06-07 NOTE — ED Triage Notes (Signed)
Pt had a fall on Thursday. States her feet got tangled under her. Majority of her pain is located in her left foot and ankle while she has some pain in her left shoulder. Pt denies hitting her head or and LOC.

## 2016-06-07 NOTE — ED Notes (Signed)
Pt left foot padded with dressing and post op shoe applied.

## 2016-06-09 ENCOUNTER — Telehealth: Payer: Self-pay | Admitting: Family Medicine

## 2016-06-09 ENCOUNTER — Other Ambulatory Visit: Payer: Self-pay

## 2016-06-09 DIAGNOSIS — S92355A Nondisplaced fracture of fifth metatarsal bone, left foot, initial encounter for closed fracture: Secondary | ICD-10-CM

## 2016-06-09 NOTE — Telephone Encounter (Signed)
Done

## 2016-06-09 NOTE — Telephone Encounter (Signed)
Patient's daughter calling to request a referral from Dr Moshe Cipro for patient to be seen at Dr. Ruthe Mannan office.  Patient was in the ER @ [facility] this past Saturday for a Fx in the L foot.  Patient called Dr. Ruthe Mannan office for an appointment this morning, but the office told her that she would need a referral.  She was told at the ER to follow up within a week. Please call daughter when the referral is completed cb [phone]

## 2016-06-17 ENCOUNTER — Ambulatory Visit (INDEPENDENT_AMBULATORY_CARE_PROVIDER_SITE_OTHER): Payer: Medicaid Other | Admitting: Orthopaedic Surgery

## 2016-06-17 ENCOUNTER — Encounter: Payer: Self-pay | Admitting: Orthopaedic Surgery

## 2016-06-17 VITALS — BP 136/80 | HR 90 | Ht 64.0 in | Wt 243.0 lb

## 2016-06-17 DIAGNOSIS — S92355A Nondisplaced fracture of fifth metatarsal bone, left foot, initial encounter for closed fracture: Secondary | ICD-10-CM

## 2016-06-17 NOTE — Progress Notes (Signed)
Subjective:    Patient ID: [name], female    DOB: 01/01/1956, 61 y.o.   MRN: [id number]  HPI She fell going to the store on 06-07-16 and hurt her left foot.  She was seen in the ER.  X-rays showed a fracture of the base of the fifth metatarsal with some comminution.  She was given post op shoe.  She has done well.  She has no other injury.  Her pain is well controlled.  She has no redness.   Review of Systems  HENT: Negative for congestion.   Respiratory: Negative for cough and shortness of breath.   Cardiovascular: Negative for chest pain and leg swelling.  Endocrine: Negative for cold intolerance.  Musculoskeletal: Positive for arthralgias and gait problem.  Allergic/Immunologic: Negative for environmental allergies.  Neurological: Positive for seizures.   Past Medical History:  Diagnosis Date  . Depression   . Fractures   . Hyperlipidemia   . Hypertension   . Mental retardation, mild (I.Q. 50-70)   . Obesity   . Seizure ([place])    11/17/2012 "still having them; "I believe their from a tumor in my head" ((11/17/2012  . Seizure disorder ([place])   . Thyroid cancer [facility])     Past Surgical History:  Procedure Laterality Date  . ABDOMINAL HYSTERECTOMY  2001  . BREAST LUMPECTOMY  1996   right   . COLONOSCOPY N/A 05/16/2016   Procedure: COLONOSCOPY;  Surgeon: Danie Binder, MD;  [facility];  Service: Endoscopy;  Laterality: N/A;  830   . THYROIDECTOMY Bilateral 11/17/2012   Procedure: TOTAL THYROIDECTOMY;  Surgeon: Ascencion Dike, MD;  Location: [facility];  Service: ENT;  Laterality: Bilateral;  . TOTAL THYROIDECTOMY Bilateral 11/17/2012  . TUBAL LIGATION      Current Outpatient Prescriptions on File Prior to Visit  Medication Sig Dispense Refill  . acetaminophen (TYLENOL) 500 MG tablet Take 1 tablet (500 mg total) by mouth 2 (two) times daily. (Patient taking differently: Take 1,000 mg by mouth 2 (two) times daily. ) 60 tablet 5  . benazepril (LOTENSIN) 40 MG tablet  TAKE (1) TABLET BY MOUTH DAILY. 90 tablet 1  . calcium-vitamin D (OSCAL WITH D) 500-200 MG-UNIT per tablet Take 1 tablet by mouth 2 (two) times daily. 60 tablet 11  . ergocalciferol (VITAMIN D2) 50000 UNITS capsule Take 50,000 Units by mouth once a week.    Marland Kitchen FLUoxetine (PROZAC) 10 MG capsule TAKE (1) CAPSULE BY MOUTH ONCE DAILY. 90 capsule 1  . hydrochlorothiazide (HYDRODIURIL) 25 MG tablet TAKE ONE TABLET BY MOUTH ONCE DAILY. 90 tablet 1  . HYDROcodone-acetaminophen (NORCO/VICODIN) 5-325 MG tablet Take 1 tablet by mouth every 4 (four) hours as needed. 20 tablet 0  . lamoTRIgine (LAMICTAL) 100 MG tablet Take 100 mg by mouth daily.    Marland Kitchen levETIRAcetam (KEPPRA) 750 MG tablet Take 1,500 mg by mouth 2 (two) times daily.     Marland Kitchen levothyroxine (SYNTHROID, LEVOTHROID) 125 MCG tablet Take 1 tablet (125 mcg total) by mouth daily before breakfast. 30 tablet 6  . loratadine (CLARITIN) 10 MG tablet TAKE ONE TABLET BY MOUTH ONCE DAILY. 90 tablet 1  . omeprazole (PRILOSEC) 20 MG capsule TAKE 2 CAPSULES BY MOUTH ONCE DAILY. 180 capsule 1  . phenazopyridine (PYRIDIUM) 200 MG tablet Take 1 tablet (200 mg total) by mouth 3 (three) times daily. 6 tablet 0  . rosuvastatin (CRESTOR) 20 MG tablet TAKE 1 TABLET BY MOUTH ONCE A DAY. 90 tablet 1  No current facility-administered medications on file prior to visit.     Social History   Social History  . Marital status: Single    Spouse name: N/A  . Number of children: 2  . Years of education: N/A   Occupational History  . disabled     Social History Main Topics  . Smoking status: Never Smoker  . Smokeless tobacco: Never Used  . Alcohol use No  . Drug use: No  . Sexual activity: No   Other Topics Concern  . Not on file   Social History Narrative  . No narrative on file    Family History  Problem Relation Age of Onset  . Diabetes Mother     BP 136/80   Pulse 90   Ht 5\' 4"  (1.626 m)   Wt 243 lb (110.2 kg)   BMI 41.71 kg/m      Objective:    Physical Exam  Constitutional: She is oriented to person, place, and time. She appears well-developed and well-nourished.  HENT:  Head: Normocephalic and atraumatic.  Eyes: Conjunctivae and EOM are normal. Pupils are equal, round, and reactive to light.  Neck: Normal range of motion. Neck supple.  Cardiovascular: Normal rate, regular rhythm and intact distal pulses.   Pulmonary/Chest: Effort normal.  Abdominal: Soft.  Musculoskeletal: She exhibits tenderness (Tender at base of left foot fifth metatarsal area.  No redness, no swelling.  NV intact. ROM of ankle full.  Right side negative.).  Neurological: She is alert and oriented to person, place, and time. She displays normal reflexes. No cranial nerve deficit. She exhibits normal muscle tone. Coordination normal.  Skin: Skin is warm and dry.  Psychiatric: She has a normal mood and affect. Her behavior is normal. Judgment and thought content normal.  Vitals reviewed.         Assessment & Plan:   Encounter Diagnosis  Name Primary?  . Closed nondisplaced fracture of fifth metatarsal bone of left foot, initial encounter Yes   Continue post op shoe.  Instructions for contrast baths given.  Return in two weeks.  X-rays on return.  Call if any problem.  Precautions discussed.  Electronically Signed Sanjuana Kava, MD 4/24/20188:34 AM

## 2016-06-18 ENCOUNTER — Ambulatory Visit (HOSPITAL_COMMUNITY)
Admission: RE | Admit: 2016-06-18 | Discharge: 2016-06-18 | Disposition: A | Discharge status: Home / Self Care | Payer: Medicaid Other | Source: Ambulatory Visit | Attending: Nephrology | Admitting: Nephrology

## 2016-06-18 ENCOUNTER — Other Ambulatory Visit: Payer: Self-pay | Admitting: Family Medicine

## 2016-06-18 DIAGNOSIS — N183 Chronic kidney disease, stage 3 unspecified: Secondary | ICD-10-CM

## 2016-06-18 DIAGNOSIS — N281 Cyst of kidney, acquired: Secondary | ICD-10-CM | POA: Insufficient documentation

## 2016-06-18 DIAGNOSIS — N2889 Other specified disorders of kidney and ureter: Secondary | ICD-10-CM | POA: Insufficient documentation

## 2016-06-19 LAB — LIPID PANEL W/O CHOL/HDL RATIO
Cholesterol, Total: 231 mg/dL — ABNORMAL HIGH (ref 100–199)
HDL: 46 mg/dL (ref >39)
LDL Calculated: 169 mg/dL — ABNORMAL HIGH (ref 0–99)
Triglycerides: 81 mg/dL (ref 0–149)
VLDL Cholesterol Cal: 16 mg/dL (ref 5–40)

## 2016-06-19 LAB — COMPREHENSIVE METABOLIC PANEL WITH GFR
ALT: 8 [IU]/L (ref 0–32)
AST: 15 [IU]/L (ref 0–40)
Albumin/Globulin Ratio: 1.1 — ABNORMAL LOW (ref 1.2–2.2)
Albumin: 4.1 g/dL (ref 3.6–4.8)
Alkaline Phosphatase: 138 [IU]/L — ABNORMAL HIGH (ref 39–117)
BUN/Creatinine Ratio: 20 (ref 12–28)
BUN: 32 mg/dL — ABNORMAL HIGH (ref 8–27)
Bilirubin Total: 0.4 mg/dL (ref 0.0–1.2)
CO2: 23 mmol/L (ref 18–29)
Calcium: 9.7 mg/dL (ref 8.7–10.3)
Chloride: 100 mmol/L (ref 96–106)
Creatinine, Ser: 1.63 mg/dL — ABNORMAL HIGH (ref 0.57–1.00)
GFR calc Af Amer: 39 mL/min/{1.73_m2} — ABNORMAL LOW
GFR calc non Af Amer: 34 mL/min/{1.73_m2} — ABNORMAL LOW
Globulin, Total: 3.9 g/dL (ref 1.5–4.5)
Glucose: 77 mg/dL (ref 65–99)
Potassium: 4.5 mmol/L (ref 3.5–5.2)
Sodium: 140 mmol/L (ref 134–144)
Total Protein: 8 g/dL (ref 6.0–8.5)

## 2016-06-19 LAB — AMBIG ABBREV CMP14 DEFAULT

## 2016-06-19 LAB — AMBIG ABBREV LP DEFAULT

## 2016-06-30 ENCOUNTER — Ambulatory Visit (INDEPENDENT_AMBULATORY_CARE_PROVIDER_SITE_OTHER): Payer: Medicaid Other | Admitting: Family Medicine

## 2016-06-30 ENCOUNTER — Encounter: Payer: Self-pay | Admitting: Family Medicine

## 2016-06-30 VITALS — BP 130/82 | HR 96 | Resp 14 | Ht 65.0 in | Wt 244.1 lb

## 2016-06-30 DIAGNOSIS — N183 Chronic kidney disease, stage 3 unspecified: Secondary | ICD-10-CM

## 2016-06-30 DIAGNOSIS — K219 Gastro-esophageal reflux disease without esophagitis: Secondary | ICD-10-CM

## 2016-06-30 DIAGNOSIS — E669 Obesity, unspecified: Secondary | ICD-10-CM

## 2016-06-30 DIAGNOSIS — F329 Major depressive disorder, single episode, unspecified: Secondary | ICD-10-CM

## 2016-06-30 DIAGNOSIS — Z1231 Encounter for screening mammogram for malignant neoplasm of breast: Secondary | ICD-10-CM | POA: Diagnosis not present

## 2016-06-30 DIAGNOSIS — E785 Hyperlipidemia, unspecified: Secondary | ICD-10-CM | POA: Diagnosis not present

## 2016-06-30 DIAGNOSIS — E890 Postprocedural hypothyroidism: Secondary | ICD-10-CM | POA: Diagnosis not present

## 2016-06-30 DIAGNOSIS — R569 Unspecified convulsions: Secondary | ICD-10-CM

## 2016-06-30 DIAGNOSIS — F32A Depression, unspecified: Secondary | ICD-10-CM

## 2016-06-30 DIAGNOSIS — I10 Essential (primary) hypertension: Secondary | ICD-10-CM

## 2016-06-30 DIAGNOSIS — IMO0001 Reserved for inherently not codable concepts without codable children: Secondary | ICD-10-CM

## 2016-06-30 DIAGNOSIS — Z1239 Encounter for other screening for malignant neoplasm of breast: Secondary | ICD-10-CM

## 2016-06-30 DIAGNOSIS — F419 Anxiety disorder, unspecified: Secondary | ICD-10-CM | POA: Diagnosis not present

## 2016-06-30 MED ORDER — ERGOCALCIFEROL 1.25 MG (50000 UT) PO CAPS: 50000.0000 [IU] | ORAL_CAPSULE | ORAL | 1 refills | Status: DC

## 2016-06-30 MED ORDER — ROSUVASTATIN CALCIUM 40 MG PO TABS: 40.0000 mg | ORAL_TABLET | Freq: Every day | ORAL | 5 refills | Status: DC

## 2016-06-30 NOTE — Progress Notes (Signed)
[name]     MRN: [id number]      DOB: 09-21-1955   HPI Ms. Rosenburg is here for follow up and re-evaluation of chronic medical conditions, medication management and review of any available recent lab and radiology data.  Preventive health is updated, specifically  Cancer screening and Immunization.   Recently fell and fractured left foot , wearing a hard shoe, also c/o right shoulder pain, she is being followed by ortho The PT denies any adverse reactions to current medications since the last visit.     ROS Denies recent fever or chills. Denies sinus pressure, nasal congestion, ear pain or sore throat. Denies chest congestion, productive cough or wheezing. Denies chest pains, palpitations and leg swelling Denies abdominal pain, nausea, vomiting,diarrhea or constipation.   Denies dysuria, frequency, hesitancy or incontinence.  Denies headaches, seizures, numbness, or tingling. Denies uncontrolled pression, anxiety or insomnia. Denies skin break down or rash.   PE  BP 130/82   Pulse 96   Resp 14   Wt 244 lb 1.9 oz (110.7 kg)   SpO2 98%   BMI 41.90 kg/m   Patient alert and oriented and in no cardiopulmonary distress.  HEENT: No facial asymmetry, EOMI,   oropharynx pink and moist.  Neck supple no JVD, no mass.  Chest: Clear to auscultation bilaterally.  CVS: S1, S2 no murmurs, no S3.Regular rate.  ABD: Soft non tender.   Ext: No edema  MS: Adequate ROM spine,  Decreased ROM right shoulder, adequate in hips and knees.Reduced in left ankle Skin: Intact, no ulcerations or rash noted.  Psych: Good eye contact, normal affect. Memory intact not anxious or depressed appearing.  CNS: CN 2-12 intact, power,  normal throughout.no focal deficits noted.   Assessment & Plan  Essential hypertension Controlled, no change in medication DASH diet and commitment to daily physical activity for a minimum of 30 minutes discussed and encouraged, as a part of hypertension  management. The importance of attaining a healthy weight is also discussed.  BP/Weight 06/30/2016 06/17/2016 06/07/2016 05/16/2016 04/29/2016 04/06/2016 2/35/5732  Systolic BP 202 542 706 237 628 315 -  Diastolic BP 82 80 73 61 84 64 -  Wt. (Lbs) 244.12 243 242 242 242 - 246  BMI 40.62 41.71 40.27 40.27 40.27 40.94 -       GERD (gastroesophageal reflux disease) Controlled, no change in medication   Hypothyroidism, postsurgical Followed by endo  CKD (chronic kidney disease) stage 3, GFR 30-59 ml/min Followed by nephrology  Anxiety and depression Controlled, no change in medication   Convulsions Controlled on current medication Followed by neurology  Hyperlipidemia LDL goal <100 Uncontrolled Dose increase in crestor Hyperlipidemia:Low fat diet discussed and encouraged.   Lipid Panel  Lab Results  Component Value Date   CHOL 231 (H) 06/18/2016   HDL 46 06/18/2016   LDLCALC 169 (H) 06/18/2016   TRIG 81 06/18/2016   CHOLHDL 3.7 01/29/2016     Updated lab needed at/ before next visit.   Obesity, Class II, BMI 35.0-39.9, with comorbidity (see actual BMI) Deteriorated. Patient re-educated about  the importance of commitment to a  minimum of 150 minutes of exercise per week.  The importance of healthy food choices with portion control discussed. Encouraged to start a food diary, count calories and to consider  joining a support group. Sample diet sheets offered. Goals set by the patient for the next several months.   Weight /BMI 06/30/2016 06/17/2016 06/07/2016  WEIGHT 244 lb 1.9 oz 243  lb 242 lb  HEIGHT 5\' 5"  5\' 4"  5\' 5"   BMI 40.62 kg/m2 41.71 kg/m2 40.27 kg/m2

## 2016-06-30 NOTE — Assessment & Plan Note (Signed)
Deteriorated. Patient re-educated about  the importance of commitment to a  minimum of 150 minutes of exercise per week.  The importance of healthy food choices with portion control discussed. Encouraged to start a food diary, count calories and to consider  joining a support group. Sample diet sheets offered. Goals set by the patient for the next several months.   Weight /BMI 06/30/2016 06/17/2016 06/07/2016  WEIGHT 244 lb 1.9 oz 243 lb 242 lb  HEIGHT 5\' 5"  5\' 4"  5\' 5"   BMI 40.62 kg/m2 41.71 kg/m2 40.27 kg/m2

## 2016-06-30 NOTE — Assessment & Plan Note (Signed)
Controlled, no change in medication DASH diet and commitment to daily physical activity for a minimum of 30 minutes discussed and encouraged, as a part of hypertension management. The importance of attaining a healthy weight is also discussed.  BP/Weight 06/30/2016 06/17/2016 06/07/2016 05/16/2016 04/29/2016 04/06/2016 0/98/1191  Systolic BP 478 295 621 308 657 846 -  Diastolic BP 82 80 73 61 84 64 -  Wt. (Lbs) 244.12 243 242 242 242 - 246  BMI 40.62 41.71 40.27 40.27 40.27 40.94 -

## 2016-06-30 NOTE — Assessment & Plan Note (Signed)
Followed by endo.  

## 2016-06-30 NOTE — Patient Instructions (Signed)
f/u in 5 month, call if you need me sooner  Fasting lipid, cmp and EGFr in 5 months  Increase dose of crestor to 40 mg daily  CUT back on fried and fatty foods  PLEASE get mammogralm appt at checkout , this is past due  It is important that you exercise regularly at least 30 minutes 5 times a week. If you develop chest pain, have severe difficulty breathing, or feel very tired, stop exercising immediately and seek medical attention   Please work on good  health habits so that your health will improve. 1. Commitment to daily physical activity for 30 to 60  minutes, if you are able to do this.  2. Commitment to wise food choices. Aim for half of your  food intake to be vegetable and fruit, one quarter starchy foods, and one quarter protein. Try to eat on a regular schedule  3 meals per day, snacking between meals should be limited to vegetables or fruits or small portions of nuts. 64 ounces of water per day is generally recommended, unless you have specific health conditions, like heart failure or kidney failure where you will need to limit fluid intake.  3. Commitment to sufficient and a  good quality of physical and mental rest daily, generally between 6 to 8 hours per day.  WITH PERSISTANCE AND PERSEVERANCE, THE IMPOSSIBLE , BECOMES THE NORM!   Thank you  for choosing [facility]. We consider it a privelige to serve you.  Delivering excellent health care in a caring and  compassionate way is our goal.  Partnering with you,  so that together we can achieve this goal is our strategy.

## 2016-06-30 NOTE — Assessment & Plan Note (Signed)
Controlled on current medication Followed by neurology

## 2016-06-30 NOTE — Assessment & Plan Note (Signed)
Uncontrolled Dose increase in crestor Hyperlipidemia:Low fat diet discussed and encouraged.   Lipid Panel  Lab Results  Component Value Date   CHOL 231 (H) 06/18/2016   HDL 46 06/18/2016   LDLCALC 169 (H) 06/18/2016   TRIG 81 06/18/2016   CHOLHDL 3.7 01/29/2016     Updated lab needed at/ before next visit.

## 2016-06-30 NOTE — Assessment & Plan Note (Signed)
Followed by nephrology. 

## 2016-06-30 NOTE — Assessment & Plan Note (Signed)
Controlled, no change in medication  

## 2016-07-01 ENCOUNTER — Other Ambulatory Visit: Payer: Self-pay | Admitting: Radiology

## 2016-07-01 DIAGNOSIS — S92355D Nondisplaced fracture of fifth metatarsal bone, left foot, subsequent encounter for fracture with routine healing: Secondary | ICD-10-CM

## 2016-07-02 ENCOUNTER — Encounter: Payer: Self-pay | Admitting: Orthopaedic Surgery

## 2016-07-02 ENCOUNTER — Ambulatory Visit (INDEPENDENT_AMBULATORY_CARE_PROVIDER_SITE_OTHER): Payer: Self-pay | Admitting: Orthopaedic Surgery

## 2016-07-02 ENCOUNTER — Ambulatory Visit (INDEPENDENT_AMBULATORY_CARE_PROVIDER_SITE_OTHER): Payer: Medicaid Other

## 2016-07-02 DIAGNOSIS — S92355A Nondisplaced fracture of fifth metatarsal bone, left foot, initial encounter for closed fracture: Secondary | ICD-10-CM

## 2016-07-02 DIAGNOSIS — S92355D Nondisplaced fracture of fifth metatarsal bone, left foot, subsequent encounter for fracture with routine healing: Secondary | ICD-10-CM | POA: Diagnosis not present

## 2016-07-02 NOTE — Progress Notes (Signed)
CC:  My foot is better  She is using the post op shoe.  She has some tenderness still of the left foot.  NV intact.  She has little swelling.  She has no redness.  X-rays were done, reported separately.  Encounter Diagnosis  Name Primary?  . Closed nondisplaced fracture of fifth metatarsal bone of left foot, initial encounter Yes   Return in one month, x-rays on return.  Call if any problem.  Precautions discussed.  Electronically Signed Sanjuana Kava, MD 5/9/20189:34 AM

## 2016-07-11 ENCOUNTER — Ambulatory Visit (HOSPITAL_COMMUNITY)
Admission: RE | Admit: 2016-07-11 | Discharge: 2016-07-11 | Disposition: A | Discharge status: Home / Self Care | Payer: Medicaid Other | Source: Ambulatory Visit | Attending: Family Medicine | Admitting: Family Medicine

## 2016-07-11 DIAGNOSIS — Z1231 Encounter for screening mammogram for malignant neoplasm of breast: Secondary | ICD-10-CM | POA: Diagnosis not present

## 2016-07-11 DIAGNOSIS — Z1239 Encounter for other screening for malignant neoplasm of breast: Secondary | ICD-10-CM

## 2016-07-31 ENCOUNTER — Ambulatory Visit (INDEPENDENT_AMBULATORY_CARE_PROVIDER_SITE_OTHER): Payer: Medicaid Other

## 2016-07-31 ENCOUNTER — Ambulatory Visit (INDEPENDENT_AMBULATORY_CARE_PROVIDER_SITE_OTHER): Payer: Self-pay | Admitting: Orthopaedic Surgery

## 2016-07-31 ENCOUNTER — Encounter: Payer: Self-pay | Admitting: Orthopaedic Surgery

## 2016-07-31 DIAGNOSIS — S92355D Nondisplaced fracture of fifth metatarsal bone, left foot, subsequent encounter for fracture with routine healing: Secondary | ICD-10-CM

## 2016-07-31 NOTE — Progress Notes (Signed)
CC:  My foot does not hurt  She is walking well with no problem.  NV intact left foot,no swelling.  X-rays were done of the left foot, reported separately.  Encounter Diagnosis  Name Primary?  . Closed nondisplaced fracture of fifth metatarsal bone of left foot with routine healing, subsequent encounter Yes    Return in one month  X-rays on return left foot  She may use regular shoe  Call if any problem.  Precautions discussed.  Electronically Signed Sanjuana Kava, MD 6/7/20189:04 AM

## 2016-08-28 ENCOUNTER — Ambulatory Visit: Payer: Medicaid Other | Admitting: Orthopaedic Surgery

## 2016-09-02 ENCOUNTER — Ambulatory Visit: Payer: Medicaid Other | Admitting: Orthopaedic Surgery

## 2016-09-04 ENCOUNTER — Ambulatory Visit (INDEPENDENT_AMBULATORY_CARE_PROVIDER_SITE_OTHER): Payer: Self-pay | Admitting: Orthopaedic Surgery

## 2016-09-04 ENCOUNTER — Ambulatory Visit (INDEPENDENT_AMBULATORY_CARE_PROVIDER_SITE_OTHER): Payer: Medicaid Other

## 2016-09-04 ENCOUNTER — Encounter: Payer: Self-pay | Admitting: Orthopaedic Surgery

## 2016-09-04 DIAGNOSIS — S92355D Nondisplaced fracture of fifth metatarsal bone, left foot, subsequent encounter for fracture with routine healing: Secondary | ICD-10-CM

## 2016-09-04 NOTE — Progress Notes (Signed)
CC:  My foot does not hurt  She has no pain of the left foot.  She is walking well.  NV intact.  Exam negative left foot  Encounter Diagnosis  Name Primary?  . Closed nondisplaced fracture of fifth metatarsal bone of left foot with routine healing, subsequent encounter Yes   I will see her as needed.  X-rays were done reported separately.  Electronically Signed Sanjuana Kava, MD 7/12/20189:39 AM

## 2016-09-08 ENCOUNTER — Other Ambulatory Visit: Payer: Self-pay | Admitting: Family Medicine

## 2016-09-08 DIAGNOSIS — I10 Essential (primary) hypertension: Secondary | ICD-10-CM

## 2016-10-10 ENCOUNTER — Other Ambulatory Visit: Payer: Self-pay | Admitting: Family Medicine

## 2016-10-10 DIAGNOSIS — I10 Essential (primary) hypertension: Secondary | ICD-10-CM

## 2016-10-13 NOTE — Telephone Encounter (Signed)
Seen 5 7 18

## 2016-10-20 ENCOUNTER — Other Ambulatory Visit: Payer: Self-pay | Admitting: "Endocrinology

## 2016-10-21 LAB — TSH: TSH: 0.09 mIU/L — ABNORMAL LOW

## 2016-10-21 LAB — T4, FREE: Free T4: 1.5 ng/dL (ref 0.8–1.8)

## 2016-10-30 ENCOUNTER — Ambulatory Visit: Payer: Medicaid Other | Admitting: "Endocrinology

## 2016-10-31 ENCOUNTER — Encounter: Payer: Self-pay | Admitting: "Endocrinology

## 2016-11-05 ENCOUNTER — Ambulatory Visit: Payer: Self-pay | Admitting: Urology

## 2016-11-14 ENCOUNTER — Other Ambulatory Visit: Payer: Self-pay | Admitting: Family Medicine

## 2016-11-14 ENCOUNTER — Other Ambulatory Visit: Payer: Self-pay | Admitting: "Endocrinology

## 2016-11-14 DIAGNOSIS — I10 Essential (primary) hypertension: Secondary | ICD-10-CM

## 2016-11-24 LAB — COMPLETE METABOLIC PANEL WITH GFR
AG Ratio: 1 (calc) (ref 1.0–2.5)
ALT: 12 U/L (ref 6–29)
AST: 17 U/L (ref 10–35)
Albumin: 4 g/dL (ref 3.6–5.1)
Alkaline phosphatase (APISO): 142 U/L — ABNORMAL HIGH (ref 33–130)
BUN/Creatinine Ratio: 19 (calc) (ref 6–22)
BUN: 39 mg/dL — ABNORMAL HIGH (ref 7–25)
CO2: 27 mmol/L (ref 20–32)
Calcium: 9.8 mg/dL (ref 8.6–10.4)
Chloride: 102 mmol/L (ref 98–110)
Creat: 2.03 mg/dL — ABNORMAL HIGH (ref 0.50–0.99)
GFR, Est African American: 30 mL/min/{1.73_m2} — ABNORMAL LOW (ref >=60)
GFR, Est Non African American: 26 mL/min/{1.73_m2} — ABNORMAL LOW (ref >=60)
Globulin: 3.9 g/dL (calc) — ABNORMAL HIGH (ref 1.9–3.7)
Glucose, Bld: 93 mg/dL (ref 65–99)
Potassium: 4.3 mmol/L (ref 3.5–5.3)
Sodium: 137 mmol/L (ref 135–146)
Total Bilirubin: 0.5 mg/dL (ref 0.2–1.2)
Total Protein: 7.9 g/dL (ref 6.1–8.1)

## 2016-11-24 LAB — LIPID PANEL
Cholesterol: 193 mg/dL (ref <200)
HDL: 49 mg/dL — ABNORMAL LOW (ref >50)
LDL Cholesterol (Calc): 124 mg/dL (calc) — ABNORMAL HIGH
Non-HDL Cholesterol (Calc): 144 mg/dL (calc) — ABNORMAL HIGH (ref <130)
Total CHOL/HDL Ratio: 3.9 (calc) (ref <5.0)
Triglycerides: 94 mg/dL (ref <150)

## 2016-11-27 ENCOUNTER — Ambulatory Visit (INDEPENDENT_AMBULATORY_CARE_PROVIDER_SITE_OTHER): Payer: Medicaid Other | Admitting: "Endocrinology

## 2016-11-27 ENCOUNTER — Encounter: Payer: Self-pay | Admitting: "Endocrinology

## 2016-11-27 VITALS — BP 141/83 | Ht 65.0 in | Wt 249.0 lb

## 2016-11-27 DIAGNOSIS — I10 Essential (primary) hypertension: Secondary | ICD-10-CM

## 2016-11-27 DIAGNOSIS — C73 Malignant neoplasm of thyroid gland: Secondary | ICD-10-CM

## 2016-11-27 DIAGNOSIS — E890 Postprocedural hypothyroidism: Secondary | ICD-10-CM | POA: Diagnosis not present

## 2016-11-27 NOTE — Progress Notes (Signed)
Subjective:    Patient ID: [name], female    DOB: 03/07/1955,    Past Medical History:  Diagnosis Date  . Depression   . Fractures   . Hyperlipidemia   . Hypertension   . Mental retardation, mild (I.Q. 50-70)   . Obesity   . Seizure ([place])    11/17/2012 "still having them; "I believe their from a tumor in my head" ((11/17/2012  . Seizure disorder ([place])   . Thyroid cancer [facility])    Past Surgical History:  Procedure Laterality Date  . ABDOMINAL HYSTERECTOMY  2001  . BREAST LUMPECTOMY  1996   right   . COLONOSCOPY N/A 05/16/2016   Procedure: COLONOSCOPY;  Surgeon: Danie Binder, MD;  Location: [facility];  Service: Endoscopy;  Laterality: N/A;  830   . THYROIDECTOMY Bilateral 11/17/2012   Procedure: TOTAL THYROIDECTOMY;  Surgeon: Ascencion Dike, MD;  Location: [place];  Service: ENT;  Laterality: Bilateral;  . TOTAL THYROIDECTOMY Bilateral 11/17/2012  . TUBAL LIGATION     Social History   Social History  . Marital status: Single    Spouse name: N/A  . Number of children: 2  . Years of education: N/A   Occupational History  . disabled     Social History Main Topics  . Smoking status: Never Smoker  . Smokeless tobacco: Never Used  . Alcohol use No  . Drug use: No  . Sexual activity: No   Other Topics Concern  . None   Social History Narrative  . None   Outpatient Encounter Prescriptions as of 11/27/2016  Medication Sig  . acetaminophen (TYLENOL) 500 MG tablet Take 1 tablet (500 mg total) by mouth 2 (two) times daily. (Patient taking differently: Take 1,000 mg by mouth 2 (two) times daily. )  . benazepril (LOTENSIN) 40 MG tablet TAKE (1) TABLET BY MOUTH DAILY.  . calcium-vitamin D (OSCAL WITH D) 500-200 MG-UNIT per tablet Take 1 tablet by mouth 2 (two) times daily.  . ergocalciferol (VITAMIN D2) 50000 units capsule Take 1 capsule (50,000 Units total) by mouth once a week. One capsule once weekly  . FLUoxetine (PROZAC) 10 MG capsule TAKE (1) CAPSULE BY MOUTH  ONCE DAILY.  . hydrochlorothiazide (HYDRODIURIL) 25 MG tablet TAKE ONE TABLET BY MOUTH ONCE DAILY.  Marland Kitchen HYDROcodone-acetaminophen (NORCO/VICODIN) 5-325 MG tablet Take 1 tablet by mouth every 4 (four) hours as needed.  . lamoTRIgine (LAMICTAL) 100 MG tablet Take 100 mg by mouth daily.  Marland Kitchen levothyroxine (SYNTHROID, LEVOTHROID) 125 MCG tablet TAKE 1 TABLET BY MOUTH DAILY BEFORE BREAKFAST.  Marland Kitchen loratadine (CLARITIN) 10 MG tablet TAKE ONE TABLET BY MOUTH ONCE DAILY.  Marland Kitchen omeprazole (PRILOSEC) 20 MG capsule TAKE 2 CAPSULES BY MOUTH ONCE DAILY.  . rosuvastatin (CRESTOR) 40 MG tablet Take 1 tablet (40 mg total) by mouth daily.  . [DISCONTINUED] levETIRAcetam (KEPPRA) 750 MG tablet Take 1,500 mg by mouth 2 (two) times daily.    No facility-administered encounter medications on file as of 11/27/2016.    ALLERGIES: Allergies  Allergen Reactions  . Simvastatin Other (See Comments)    Muscle aches   VACCINATION STATUS: Immunization History  Administered Date(s) Administered  . H1N1 12/16/2007  . Influenza Split 11/24/2011  . Influenza Whole 11/30/2006, 12/07/2008, 12/26/2009  . Influenza,inj,Quad PF,6+ Mos 11/18/2012, 12/06/2013, 05/23/2015, 01/29/2016  . Pneumococcal Conjugate-13 04/03/2014  . Td 08/22/2003  . Tdap 04/02/2016    HPI  61 yr old female with medical hx as above . She is  here to f/u for post surgical hypothyroidism and for f/u of Papillary  thyroid cancer. 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 She has had  Thyrogen stimulated remnant ablation with negative post therapy WBS in 2014.    She is now on Synthroid 125 g by mouth every morning.  - An attempt to biopsy the left cervical 0.9 cm lymph node was abandoned because " no evidence of adenopathy". - Second Thyrogen Stimulated whole body scan for surveillance shows no scintigraphic evidence of iodine avid metastasis papillary thyroid cancer  on 04/18/2016. -She is compliant  to her thyroid hormone ,denies any new complaints.  - Medicaid denied coverage for surveillance thyroid /neck ultrasound. she denies palpitations. she denies dysphagia, SOB, nor voice change. she denies family hx of thyroid cancer.  Review of Systems  Constitutional: Positive for fatigue. Negative for unexpected weight change.  HENT: Negative for trouble swallowing and voice change.   Eyes: Negative for visual disturbance.  Respiratory: Negative for cough, shortness of breath and wheezing.   Cardiovascular: Negative for chest pain, palpitations and leg swelling.  Gastrointestinal: Negative for diarrhea, nausea and vomiting.  Endocrine: Negative for cold intolerance, heat intolerance, polydipsia, polyphagia and polyuria.  Musculoskeletal: Negative for arthralgias and myalgias.  Skin: Negative for color change, pallor, rash and wound.  Neurological: Negative for seizures and headaches.  Psychiatric/Behavioral: Negative for confusion and suicidal ideas.    Objective:    BP (!) 141/83   Ht 5\' 5"  (1.651 m)   Wt 249 lb (112.9 kg)   BMI 41.44 kg/m   Wt Readings from Last 3 Encounters:  11/27/16 249 lb (112.9 kg)  06/30/16 244 lb 1.9 oz (110.7 kg)  06/17/16 243 lb (110.2 kg)    Physical Exam  Constitutional: She is oriented to person, place, and time. She appears well-developed.  HENT:  Head: Normocephalic and atraumatic.  Eyes: EOM are normal.  Neck: Normal range of motion. Neck supple. No tracheal deviation present.  Post thyroidectomy scar, no gross mass lesion on PE.  Cardiovascular: Normal rate and regular rhythm.   Pulmonary/Chest: Effort normal and breath sounds normal.  Abdominal: Soft. Bowel sounds are normal. There is no tenderness. There is no guarding.  Musculoskeletal: Normal range of motion. She exhibits no edema.  Neurological: She is alert and oriented to person, place, and time. She has normal reflexes. No cranial nerve deficit.  Coordination normal.  Skin: Skin is warm and dry. No rash noted. No erythema. No pallor.  Psychiatric: She has a normal mood and affect. Judgment normal.    Results for orders placed or performed in visit on 10/20/16  TSH  Result Value Ref Range   TSH 0.09 (L) mIU/L  T4, free  Result Value Ref Range   Free T4 1.5 0.8 - 1.8 ng/dL   Complete Blood Count (Most recent): Lab Results  Component Value Date   WBC 5.5 04/06/2016   HGB 11.5 (L) 04/06/2016   HCT 34.6 (L) 04/06/2016   MCV 85.2 04/06/2016   PLT 221 04/06/2016   Chemistry (most recent): Lab Results  Component Value Date   NA 137 11/24/2016   K 4.3 11/24/2016   CL 102 11/24/2016   CO2 27 11/24/2016   BUN 39 (H) 11/24/2016   CREATININE 2.03 (H) 11/24/2016   Diabetic Labs (most recent): Lab Results  Component Value Date   HGBA1C 5.3 08/10/2012   Lipid Panel     Component  Value Date/Time   CHOL 193 11/24/2016 0854   CHOL 231 (H) 06/18/2016 0954   TRIG 94 11/24/2016 0854   HDL 49 (L) 11/24/2016 0854   HDL 46 06/18/2016 0954   CHOLHDL 3.9 11/24/2016 0854   VLDL 14 01/29/2016 1109   LDLCALC 169 (H) 06/18/2016 0954       Assessment & Plan:   1. Hypothyroidism, postsurgical -Her repeat thyroid function tests are consistent with appropriate replacement. - I will Continue levothyroxine  125 g by mouth every morning.  - We discussed about correct intake of levothyroxine, at fasting, with water, separated by at least 30 minutes from breakfast, and separated by more than 4 hours from calcium, iron, multivitamins, acid reflux medications (PPIs). -Patient is made aware of the fact that thyroid hormone replacement is needed for life, dose to be adjusted by periodic monitoring of thyroid function tests. - Target TSH for her would be between 0.1-0.5.  2. Papillary thyroid carcinoma ([place]) Her  surveillance neck /thyroid u/s from 11/17/2013 was c/w surgically absent thyroid, however there is a 0.9 cm left cervical lymph  node. See below.  Pt is s/p thyrogen stimulated thyroid remnant ablation with WBS on 01/14/2013 with no evidence of iodine avid metastasis. She has had pT2,Nx,Mx FVPTC, s/p near total thyroidectomy on September 24,2014. She is counseled to maintain high degree of compliance with synthroid therapy with aim of keeping TSH low normal ( 0.1-0.5).  -she is advised on the necessity of follow-ups and imaging studies at least one annually for the next 5 years. - She was sent for core biopsy of 0.9 cm left cervical lymph node last visit. However, this procedure was abandoned due to " no evidence of adenopathy".  The previously noticed  left cervical lymph node was sent to decrease in size to 0.5 cm. Medicaid did not cover surveillance ultrasound. - Second Thyrogen estimated or body scan for surveillance shows no scintigraphic evidence of iodine avid metastasis papillary thyroid cancer on 04/18/2016. - During last attempt, Medicaid did not pay for surveillance thyroid/neck ultrasound. She will need a repeat surveillance thyroid/neck ultrasound before her next visit, we will try again.  3. Essential hypertension - uncontrolled, due to the fact that she did not take her blood pressure medications this morning. Patient is advised to be consistent taking her Benazepril  20 mg by mouth daily and hydrochlorothiazide 25 mg by mouth daily.  4. Obesity, Class II, BMI 35.0-39.9, with comorbidity (see actual BMI) ([place]) -She continues to gain weight.  She will have A1c before her next visit. Suggestion is made for her to avoid simple carbohydrates  from her diet including Cakes, Sweet Desserts, Ice Cream, Soda (diet and regular), Sweet Tea, Candies, Chips, Cookies, Store Bought Juices, Alcohol in Excess of  1-2 drinks a day, Artificial Sweeteners, and "Sugar-free" Products. This will help patient to have stable blood glucose profile and potentially avoid unintended weight gain.   Follow up plan: Return in about 6  months (around 05/28/2017) for follow up with pre-visit labs, Thyroid / Neck Ultrasound.  Glade Lloyd, MD Phone: [phone]  Fax: [phone]   11/27/2016, 1:35 PM

## 2016-12-01 ENCOUNTER — Ambulatory Visit (INDEPENDENT_AMBULATORY_CARE_PROVIDER_SITE_OTHER): Payer: Medicaid Other | Admitting: Family Medicine

## 2016-12-01 ENCOUNTER — Encounter: Payer: Self-pay | Admitting: Family Medicine

## 2016-12-01 VITALS — BP 138/82 | HR 65 | Temp 98.1°F | Resp 16 | Ht 65.0 in | Wt 250.2 lb

## 2016-12-01 DIAGNOSIS — K219 Gastro-esophageal reflux disease without esophagitis: Secondary | ICD-10-CM

## 2016-12-01 DIAGNOSIS — E785 Hyperlipidemia, unspecified: Secondary | ICD-10-CM

## 2016-12-01 DIAGNOSIS — R569 Unspecified convulsions: Secondary | ICD-10-CM

## 2016-12-01 DIAGNOSIS — N183 Chronic kidney disease, stage 3 unspecified: Secondary | ICD-10-CM

## 2016-12-01 DIAGNOSIS — I10 Essential (primary) hypertension: Secondary | ICD-10-CM

## 2016-12-01 DIAGNOSIS — Z23 Encounter for immunization: Secondary | ICD-10-CM

## 2016-12-01 DIAGNOSIS — E890 Postprocedural hypothyroidism: Secondary | ICD-10-CM

## 2016-12-01 NOTE — Assessment & Plan Note (Addendum)
Unchanged Patient re-educated about  the importance of commitment to a  minimum of 150 minutes of exercise per week.  The importance of healthy food choices with portion control discussed. Encouraged to start a food diary, count calories and to consider  joining a support group. Sample diet sheets offered. Goals set by the patient for the next several months.   Weight /BMI 12/01/2016 11/27/2016 06/30/2016  WEIGHT 250 lb 4 oz 249 lb 244 lb 1.9 oz  HEIGHT 5\' 5"  5\' 5"  5\' 5"   BMI 41.64 kg/m2 41.44 kg/m2 40.62 kg/m2

## 2016-12-01 NOTE — Assessment & Plan Note (Signed)
Needs to reduce sodium intake and increase water intake

## 2016-12-01 NOTE — Progress Notes (Signed)
[name]     MRN: [id number]      DOB: 01-Mar-1955   HPI [name] is here for follow up and re-evaluation of chronic medical conditions, medication management and review of any available recent lab and radiology data.  Preventive health is updated, specifically  Cancer screening and Immunization.   Questions or concerns regarding consultations or procedures which the PT has had in the interim are  addressed. The PT denies any adverse reactions to current medications since the last visit.  There are no new concerns.  There are no specific complaints   ROS Denies recent fever or chills. Denies sinus pressure, nasal congestion, ear pain or sore throat. Denies chest congestion, productive cough or wheezing. Denies chest pains, palpitations and leg swelling Denies abdominal pain, nausea, vomiting,diarrhea or constipation.   Denies dysuria, frequency, hesitancy or incontinence. Denies joint pain, swelling and limitation in mobility. Denies headaches, seizures, numbness, or tingling. Denies depression, anxiety or insomnia. Denies skin break down or rash.   PE  BP 138/82   Pulse 65   Temp 98.1 F (36.7 C) (Other (Comment))   Resp 16   Ht 5\' 5"  (1.651 m)   Wt 250 lb 4 oz (113.5 kg)   SpO2 97%   BMI 41.64 kg/m   Patient alert and oriented and in no cardiopulmonary distress.  HEENT: No facial asymmetry, EOMI,   oropharynx pink and moist.  Neck supple no JVD, no mass.  Chest: Clear to auscultation bilaterally.  CVS: S1, S2 no murmurs, no S3.Regular rate.  ABD: Soft non tender.   Ext: No edema  MS: Adequate ROM spine, shoulders, hips and knees.  Skin: Intact, no ulcerations or rash noted.  Psych: Good eye contact, normal affect. Memory intact not anxious or depressed appearing.  CNS: CN 2-12 intact, power,  normal throughout.no focal deficits noted.   Assessment & Plan  Essential hypertension Controlled, no change in medication DASH diet and commitment to  daily physical activity for a minimum of 30 minutes discussed and encouraged, as a part of hypertension management. The importance of attaining a healthy weight is also discussed.  BP/Weight 12/01/2016 11/27/2016 06/30/2016 06/17/2016 06/07/2016 7/89/3810 03/02/5100  Systolic BP 585 277 824 235 361 443 154  Diastolic BP 82 83 82 80 73 61 84  Wt. (Lbs) 250.25 249 244.12 243 242 242 242  BMI 41.64 41.44 40.62 41.71 40.27 40.27 40.27       Morbid obesity ([facility]) Unchanged Patient re-educated about  the importance of commitment to a  minimum of 150 minutes of exercise per week.  The importance of healthy food choices with portion control discussed. Encouraged to start a food diary, count calories and to consider  joining a support group. Sample diet sheets offered. Goals set by the patient for the next several months.   Weight /BMI 12/01/2016 11/27/2016 06/30/2016  WEIGHT 250 lb 4 oz 249 lb 244 lb 1.9 oz  HEIGHT 5\' 5"  5\' 5"  5\' 5"   BMI 41.64 kg/m2 41.44 kg/m2 40.62 kg/m2      Hyperlipidemia LDL goal <100 Hyperlipidemia:Low fat diet discussed and encouraged.   Lipid Panel  Lab Results  Component Value Date   CHOL 193 11/24/2016   HDL 49 (L) 11/24/2016   LDLCALC 169 (H) 06/18/2016   TRIG 94 11/24/2016   CHOLHDL 3.9 11/24/2016   Updated lab needed at/ before next visit. Not at goal and uncontrolled    GERD (gastroesophageal reflux disease) Controlled, no change in medication  Hypothyroidism, postsurgical Treated by endo and stable  Convulsions Treated by neurology and controlled on current medication  CKD (chronic kidney disease) stage 3, GFR 30-59 ml/min Needs to reduce sodium intake and increase water intake

## 2016-12-01 NOTE — Assessment & Plan Note (Signed)
Treated by neurology and controlled on current medication 

## 2016-12-01 NOTE — Assessment & Plan Note (Signed)
Controlled, no change in medication DASH diet and commitment to daily physical activity for a minimum of 30 minutes discussed and encouraged, as a part of hypertension management. The importance of attaining a healthy weight is also discussed.  BP/Weight 12/01/2016 11/27/2016 06/30/2016 06/17/2016 06/07/2016 04/25/7207 1/0/6816  Systolic BP 619 694 098 286 751 982 429  Diastolic BP 82 83 82 80 73 61 84  Wt. (Lbs) 250.25 249 244.12 243 242 242 242  BMI 41.64 41.44 40.62 41.71 40.27 40.27 40.27

## 2016-12-01 NOTE — Assessment & Plan Note (Signed)
Controlled, no change in medication  

## 2016-12-01 NOTE — Assessment & Plan Note (Signed)
Hyperlipidemia:Low fat diet discussed and encouraged.   Lipid Panel  Lab Results  Component Value Date   CHOL 193 11/24/2016   HDL 49 (L) 11/24/2016   LDLCALC 169 (H) 06/18/2016   TRIG 94 11/24/2016   CHOLHDL 3.9 11/24/2016   Updated lab needed at/ before next visit. Not at goal and uncontrolled

## 2016-12-01 NOTE — Assessment & Plan Note (Signed)
Treated by endo and stable

## 2016-12-01 NOTE — Patient Instructions (Addendum)
Physical exam in early March, call if you need me sooner  Flu vaccine today  Please make sure that you drink 4 bottles of water every day  Eat smaller portions of food, less canned foods as able and less fatty and fried  foods  For better health  Fasting lipid, cmp and eGFR last week in February  Thank you  for choosing [facility]. We consider it a privelige to serve you.  Delivering excellent health care in a caring and  compassionate way is our goal.  Partnering with you,  so that together we can achieve this goal is our strategy.

## 2016-12-18 ENCOUNTER — Other Ambulatory Visit: Payer: Self-pay | Admitting: Family Medicine

## 2016-12-18 DIAGNOSIS — I10 Essential (primary) hypertension: Secondary | ICD-10-CM

## 2016-12-23 DIAGNOSIS — I10 Essential (primary) hypertension: Secondary | ICD-10-CM | POA: Diagnosis not present

## 2016-12-23 DIAGNOSIS — R569 Unspecified convulsions: Secondary | ICD-10-CM | POA: Diagnosis not present

## 2016-12-23 DIAGNOSIS — F78 Other intellectual disabilities: Secondary | ICD-10-CM | POA: Diagnosis not present

## 2017-01-22 ENCOUNTER — Other Ambulatory Visit: Payer: Self-pay | Admitting: Family Medicine

## 2017-02-16 ENCOUNTER — Other Ambulatory Visit: Payer: Self-pay | Admitting: Family Medicine

## 2017-02-18 NOTE — Telephone Encounter (Signed)
Seen [phone] 8 [phone]

## 2017-02-26 DIAGNOSIS — I10 Essential (primary) hypertension: Secondary | ICD-10-CM | POA: Diagnosis not present

## 2017-02-26 DIAGNOSIS — R569 Unspecified convulsions: Secondary | ICD-10-CM | POA: Diagnosis not present

## 2017-02-26 DIAGNOSIS — F78 Other intellectual disabilities: Secondary | ICD-10-CM | POA: Diagnosis not present

## 2017-03-15 ENCOUNTER — Emergency Department (HOSPITAL_COMMUNITY)
Admission: EM | Admit: 2017-03-15 | Discharge: 2017-03-15 | Disposition: A | Discharge status: Home / Self Care | Payer: Medicaid Other | Attending: Emergency Medicine | Admitting: Emergency Medicine

## 2017-03-15 ENCOUNTER — Emergency Department (HOSPITAL_COMMUNITY): Payer: Medicaid Other

## 2017-03-15 ENCOUNTER — Other Ambulatory Visit: Payer: Self-pay

## 2017-03-15 ENCOUNTER — Encounter (HOSPITAL_COMMUNITY): Payer: Self-pay | Admitting: Emergency Medicine

## 2017-03-15 DIAGNOSIS — S92912A Unspecified fracture of left toe(s), initial encounter for closed fracture: Secondary | ICD-10-CM

## 2017-03-15 DIAGNOSIS — W010XXA Fall on same level from slipping, tripping and stumbling without subsequent striking against object, initial encounter: Secondary | ICD-10-CM | POA: Diagnosis not present

## 2017-03-15 DIAGNOSIS — Y999 Unspecified external cause status: Secondary | ICD-10-CM | POA: Diagnosis not present

## 2017-03-15 DIAGNOSIS — F79 Unspecified intellectual disabilities: Secondary | ICD-10-CM | POA: Insufficient documentation

## 2017-03-15 DIAGNOSIS — Y939 Activity, unspecified: Secondary | ICD-10-CM | POA: Diagnosis not present

## 2017-03-15 DIAGNOSIS — I129 Hypertensive chronic kidney disease with stage 1 through stage 4 chronic kidney disease, or unspecified chronic kidney disease: Secondary | ICD-10-CM | POA: Insufficient documentation

## 2017-03-15 DIAGNOSIS — N183 Chronic kidney disease, stage 3 (moderate): Secondary | ICD-10-CM | POA: Diagnosis not present

## 2017-03-15 DIAGNOSIS — S99922A Unspecified injury of left foot, initial encounter: Secondary | ICD-10-CM | POA: Diagnosis present

## 2017-03-15 DIAGNOSIS — Y929 Unspecified place or not applicable: Secondary | ICD-10-CM | POA: Insufficient documentation

## 2017-03-15 DIAGNOSIS — Z79899 Other long term (current) drug therapy: Secondary | ICD-10-CM | POA: Diagnosis not present

## 2017-03-15 MED ORDER — ONDANSETRON HCL 4 MG PO TABS
4.0000 mg | ORAL_TABLET | Freq: Once | ORAL | Status: AC
  Administered 2017-03-15: 4 mg via ORAL
  Filled 2017-03-15: qty 1

## 2017-03-15 MED ORDER — HYDROCODONE-ACETAMINOPHEN 5-325 MG PO TABS: 1.0000 | ORAL_TABLET | ORAL | 0 refills | Status: DC | PRN

## 2017-03-15 MED ORDER — HYDROCODONE-ACETAMINOPHEN 5-325 MG PO TABS
2.0000 | ORAL_TABLET | Freq: Once | ORAL | Status: AC
  Administered 2017-03-15: 2 via ORAL
  Filled 2017-03-15: qty 2

## 2017-03-15 MED ORDER — MELOXICAM 15 MG PO TABS: 15.0000 mg | ORAL_TABLET | Freq: Every day | ORAL | 0 refills | Status: DC

## 2017-03-15 NOTE — ED Provider Notes (Signed)
[facility] EMERGENCY DEPARTMENT Provider Note   CSN: [id number] Arrival date & time: 03/15/17  1208     History   Chief Complaint Chief Complaint  Patient presents with  . Foot Pain    HPI [name] is a 62 y.o. female.  Patient is a 62 year old female who presents to the emergency department with a complaint of left foot pain.  The patient states that she slipped, and fell injuring the left foot on last night.  She noted pain on last night, but was gradually able to go to sleep.  This morning she awakened with swelling and more pain.  The pain is worse when she attempts to put any weight on the left foot.  The patient denies any other injury.  It is of note that she had a broken bone in that same foot approximately 2-3 months ago.  She presents now for assistance with this problem.      Past Medical History:  Diagnosis Date  . Depression   . Fractures   . Hyperlipidemia   . Hypertension   . Mental retardation, mild (I.Q. 50-70)   . Obesity   . Seizure ([place])    11/17/2012 "still having them; "I believe their from a tumor in my head" ((11/17/2012  . Seizure disorder ([place])   . Thyroid cancer [facility])     Patient Active Problem List   Diagnosis Date Noted  . CKD (chronic kidney disease) stage 3, GFR 30-59 ml/min (HCC) 04/03/2016  . GERD (gastroesophageal reflux disease) 08/23/2015  . Osteoarthritis of right knee 08/15/2014  . 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 Reduced vision 05/03/2013  . 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 Vitamin D deficiency 08/16/2009  . POLYNEUROPATHY 04/10/2009  . Hyperlipidemia LDL goal <100 07/15/2007  . Morbid obesity ([place]) 07/15/2007  . Anxiety and depression 07/15/2007  . MENTAL RETARDATION, MILD 07/15/2007  . Essential hypertension 07/15/2007  . Convulsions ([place]) 07/15/2007    Past Surgical History:  Procedure Laterality Date  . ABDOMINAL HYSTERECTOMY  2001  . BREAST LUMPECTOMY  1996   right   .  COLONOSCOPY N/A 05/16/2016   Procedure: COLONOSCOPY;  Surgeon: Danie Binder, MD;  [facility];  Service: Endoscopy;  Laterality: N/A;  830   . THYROIDECTOMY Bilateral 11/17/2012   Procedure: TOTAL THYROIDECTOMY;  Surgeon: Ascencion Dike, MD;  Location: [place];  Service: ENT;  Laterality: Bilateral;  . TOTAL THYROIDECTOMY Bilateral 11/17/2012  . TUBAL LIGATION      OB History    No data available       Home Medications    Prior to Admission medications   Medication Sig Start Date End Date Taking? Authorizing Provider  acetaminophen (TYLENOL) 500 MG tablet Take 1 tablet (500 mg total) by mouth 2 (two) times daily. 08/15/14  Yes [name], MD  benazepril (LOTENSIN) 40 MG tablet TAKE (1) TABLET BY MOUTH DAILY. 12/18/16  Yes [name], MD  calcitRIOL (ROCALTROL) 0.25 MCG capsule Take 0.25 mcg by mouth every other day. Monday Wednesday and Friday   Yes [provider]  ergocalciferol (VITAMIN D2) 50000 units capsule Take 1 capsule (50,000 Units total) by mouth once a week. One capsule once weekly Patient taking differently: Take 50,000 Units by mouth every 30 (thirty) days. One capsule once weekly 06/30/16  Yes [name], MD  FLUoxetine (PROZAC) 10 MG capsule TAKE (1) CAPSULE BY MOUTH ONCE DAILY. 10/13/16  Yes [name], MD  hydrochlorothiazide (HYDRODIURIL) 25  MG tablet TAKE ONE TABLET BY MOUTH ONCE DAILY. 10/13/16  Yes [name], MD  lamoTRIgine (LAMICTAL) 100 MG tablet Take 100 mg by mouth daily.   Yes [provider]  levETIRAcetam (KEPPRA) 750 MG tablet Take 1,500 mg by mouth 2 (two) times daily.   Yes [provider]  loratadine (CLARITIN) 10 MG tablet TAKE ONE TABLET BY MOUTH ONCE DAILY. 10/13/16  Yes [name], MD  omeprazole (PRILOSEC) 20 MG capsule TAKE 2 CAPSULES BY MOUTH ONCE DAILY. 10/13/16  Yes [name], MD  rosuvastatin (CRESTOR) 40 MG tablet TAKE 1 TABLET BY MOUTH ONCE A DAY.  02/18/17  Yes [name], MD    Family History Family History  Problem Relation Age of Onset  . Diabetes Mother     Social History Social History   Tobacco Use  . Smoking status: Never Smoker  . Smokeless tobacco: Never Used  Substance Use Topics  . Alcohol use: No  . Drug use: No     Allergies   Simvastatin   Review of Systems Review of Systems  Constitutional: Negative for activity change.       All ROS Neg except as noted in HPI  HENT: Negative for nosebleeds.   Eyes: Negative for photophobia and discharge.  Respiratory: Negative for cough, shortness of breath and wheezing.   Cardiovascular: Negative for chest pain and palpitations.  Gastrointestinal: Negative for abdominal pain and blood in stool.  Genitourinary: Negative for dysuria, frequency and hematuria.  Musculoskeletal: Positive for arthralgias. Negative for back pain and neck pain.  Skin: Negative.   Neurological: Negative for dizziness, seizures and speech difficulty.  Psychiatric/Behavioral: Negative for confusion and hallucinations.     Physical Exam Updated Vital Signs BP 122/63 (BP Location: Right Arm)   Pulse 81   Temp 97.8 F (36.6 C) (Oral)   Resp 18   Ht 5\' 5"  (1.651 m)   Wt 113.4 kg (250 lb)   SpO2 100%   BMI 41.60 kg/m   Physical Exam  Constitutional: She is oriented to person, place, and time. She appears well-developed and well-nourished.  Non-toxic appearance.  HENT:  Head: Normocephalic.  Right Ear: Tympanic membrane and external ear normal.  Left Ear: Tympanic membrane and external ear normal.  Eyes: EOM and lids are normal. Pupils are equal, round, and reactive to light.  Neck: Normal range of motion. Neck supple. Carotid bruit is not present.  Cardiovascular: Normal rate, regular rhythm, normal heart sounds, intact distal pulses and normal pulses.  Pulmonary/Chest: Breath sounds normal. No respiratory distress.  Abdominal: Soft. Bowel sounds are normal. There is  no tenderness. There is no guarding.  Musculoskeletal: Normal range of motion.  There is swelling of the third and fourth toe with pain to palpation on the left foot.  The dorsalis pedis pulses 2+.  Capillary refill is less than 2 seconds.  The Achilles tendon is intact.  There is good range of motion of the right and left knees and hips.  Lymphadenopathy:       Head (right side): No submandibular adenopathy present.       Head (left side): No submandibular adenopathy present.    She has no cervical adenopathy.  Neurological: She is alert and oriented to person, place, and time. She has normal strength. No cranial nerve deficit or sensory deficit.  Skin: Skin is warm and dry.  Psychiatric: She has a normal mood and affect. Her speech is normal.  Nursing note and vitals reviewed.  ED Treatments / Results  Labs (all labs ordered are listed, but only abnormal results are displayed) Labs Reviewed - No data to display  EKG  EKG Interpretation None       Radiology Dg Foot Complete Left  Result Date: 03/15/2017 CLINICAL DATA:  Left foot pain.  Slip and fall. EXAM: LEFT FOOT - COMPLETE 3+ VIEW COMPARISON:  09/04/2016 FINDINGS: New displaced fractures involving the third and fourth toes. There is a comminuted fracture at the base of the fourth toe proximal phalanx. Fourth toe fracture involves the MTP joint. Displaced and mildly comminuted fracture in the mid shaft of the third toe proximal phalanx. Minimal spurring at the calcaneus. Old fracture at the base of the fifth metatarsal bone. IMPRESSION: Fractures involving the proximal phalanx of the third and fourth toes. Electronically Signed   By: Markus Daft M.D.   On: 03/15/2017 13:27    Procedures Procedures (including critical care time)  Medications Ordered in ED Medications  HYDROcodone-acetaminophen (NORCO/VICODIN) 5-325 MG per tablet 2 tablet (not administered)  ondansetron (ZOFRAN) tablet 4 mg (not administered)     Initial  Impression / Assessment and Plan / ED Course  I have reviewed the triage vital signs and the nursing notes.  Pertinent labs & imaging results that were available during my care of the patient were reviewed by me and considered in my medical decision making (see chart for details).       Final Clinical Impressions(s) / ED Diagnoses MDM Vital signs are within normal limits.  X-ray is considered with a comminuted fracture of the third and fourth toe of the left foot.  No neurological vascular deficits appreciated.  Patient fitted with buddy tape splint and postoperative shoe.  We discussed the importance of keeping the foot elevated above the waist at sitting and above the heart when supine.  Prescription for hydrocodone and Mobic given to the patient.  Patient referred to orthopedics for additional evaluation and management.   Final diagnoses:  Closed fracture of multiple phalanges of toe of left foot, initial encounter    ED Discharge Orders        Ordered    meloxicam (MOBIC) 15 MG tablet  Daily     03/15/17 1428    HYDROcodone-acetaminophen (NORCO/VICODIN) 5-325 MG tablet  Every 4 hours PRN     03/15/17 1428       Lily Kocher, PA-C 03/15/17 1428    Mesner, Corene Cornea, MD 03/15/17 1503

## 2017-03-15 NOTE — ED Triage Notes (Signed)
Patient c/o left foot after slipping and falling on top of foot last night. Denies hitting head or LOC. Patient wearing post-op shoe that she had at house from previous injury to foot. Swelling noted. Denies taking any medication for pain.

## 2017-03-15 NOTE — Discharge Instructions (Signed)
Third and fourth toes of your left foot are broken.  Please use the buddy tape splint in the wooden shoe until seen by Dr. Aline Brochure.  Please keep your foot elevated above your waist when you are sitting and above your heart when you are lying down.  Use Tylenol for mild pain, use Norco for more severe pain.  Use Mobic daily for inflammation.

## 2017-03-16 ENCOUNTER — Other Ambulatory Visit: Payer: Self-pay | Admitting: "Endocrinology

## 2017-03-18 ENCOUNTER — Ambulatory Visit: Payer: Medicaid Other | Admitting: Orthopaedic Surgery

## 2017-03-18 ENCOUNTER — Encounter: Payer: Self-pay | Admitting: Orthopaedic Surgery

## 2017-03-18 VITALS — BP 101/65 | HR 96 | Ht 65.0 in | Wt 250.0 lb

## 2017-03-18 DIAGNOSIS — S92502A Displaced unspecified fracture of left lesser toe(s), initial encounter for closed fracture: Secondary | ICD-10-CM | POA: Diagnosis not present

## 2017-03-18 NOTE — Progress Notes (Signed)
Patient Christine:XWRUEA KORYNNE Huffman, female DOB:December 26, 1955, 62 y.o. VWU:[id number]  Chief Complaint  Patient presents with  . New Problem    Fracture Care left phalanx of third and fourth toes    HPI  [name] is a 62 y.o. female who fell at home on 03-15-17 and hurt her left foot, third and fourth toes.  She has fractures of the toes slightly displaced.  She has no other injury.  She was given a post op shoe in the ER and had the toes buddy taped.  She has minimal pain.  She has no redness. HPI  Body mass index is 41.6 kg/m.  ROS  Review of Systems  HENT: Negative for congestion.   Respiratory: Negative for cough and shortness of breath.   Cardiovascular: Negative for chest pain and leg swelling.  Endocrine: Negative for cold intolerance.  Musculoskeletal: Positive for arthralgias and gait problem.  Allergic/Immunologic: Negative for environmental allergies.  Neurological: Positive for seizures.  All other systems reviewed and are negative.   Past Medical History:  Diagnosis Date  . Depression   . Fractures   . Hyperlipidemia   . Hypertension   . Mental retardation, mild (I.Q. 50-70)   . Obesity   . Seizure ([place])    11/17/2012 "still having them; "I believe their from a tumor in my head" ((11/17/2012  . Seizure disorder ([place])   . Thyroid cancer [facility])     Past Surgical History:  Procedure Laterality Date  . ABDOMINAL HYSTERECTOMY  2001  . BREAST LUMPECTOMY  1996   right   . COLONOSCOPY N/A 05/16/2016   Procedure: COLONOSCOPY;  Surgeon: Danie Binder, MD;  [facility];  Service: Endoscopy;  Laterality: N/A;  830   . THYROIDECTOMY Bilateral 11/17/2012   Procedure: TOTAL THYROIDECTOMY;  Surgeon: Ascencion Dike, MD;  Location: [place];  Service: ENT;  Laterality: Bilateral;  . TOTAL THYROIDECTOMY Bilateral 11/17/2012  . TUBAL LIGATION      Family History  Problem Relation Age of Onset  . Diabetes Mother     Social History Social History   Tobacco Use  .  Smoking status: Never Smoker  . Smokeless tobacco: Never Used  Substance Use Topics  . Alcohol use: No  . Drug use: No    Allergies  Allergen Reactions  . Simvastatin Other (See Comments)    Muscle aches    Current Outpatient Medications  Medication Sig Dispense Refill  . acetaminophen (TYLENOL) 500 MG tablet Take 1 tablet (500 mg total) by mouth 2 (two) times daily. 60 tablet 5  . benazepril (LOTENSIN) 40 MG tablet TAKE (1) TABLET BY MOUTH DAILY. 30 tablet 0  . calcitRIOL (ROCALTROL) 0.25 MCG capsule Take 0.25 mcg by mouth every other day. Monday Wednesday and Friday    . ergocalciferol (VITAMIN D2) 50000 units capsule Take 1 capsule (50,000 Units total) by mouth once a week. One capsule once weekly (Patient taking differently: Take 50,000 Units by mouth every 30 (thirty) days. One capsule once weekly) 12 capsule 1  . FLUoxetine (PROZAC) 10 MG capsule TAKE (1) CAPSULE BY MOUTH ONCE DAILY. 90 capsule 1  . hydrochlorothiazide (HYDRODIURIL) 25 MG tablet TAKE ONE TABLET BY MOUTH ONCE DAILY. 90 tablet 1  . HYDROcodone-acetaminophen (NORCO/VICODIN) 5-325 MG tablet Take 1 tablet by mouth every 4 (four) hours as needed. 16 tablet 0  . lamoTRIgine (LAMICTAL) 100 MG tablet Take 100 mg by mouth daily.    Marland Kitchen levETIRAcetam (KEPPRA) 750 MG tablet Take 1,500 mg  by mouth 2 (two) times daily.    Marland Kitchen levothyroxine (SYNTHROID, LEVOTHROID) 125 MCG tablet TAKE 1 TABLET BY MOUTH DAILY BEFORE BREAKFAST. 30 tablet 5  . loratadine (CLARITIN) 10 MG tablet TAKE ONE TABLET BY MOUTH ONCE DAILY. 90 tablet 1  . meloxicam (MOBIC) 15 MG tablet Take 1 tablet (15 mg total) by mouth daily. 6 tablet 0  . omeprazole (PRILOSEC) 20 MG capsule TAKE 2 CAPSULES BY MOUTH ONCE DAILY. 180 capsule 1  . rosuvastatin (CRESTOR) 40 MG tablet TAKE 1 TABLET BY MOUTH ONCE A DAY. 90 tablet 0   No current facility-administered medications for this visit.      Physical Exam  Blood pressure 101/65, pulse 96, height 5\' 5"  (1.651 m), weight  250 lb (113.4 kg).  Constitutional: overall normal hygiene, normal nutrition, well developed, normal grooming, normal body habitus. Assistive device:post op shoe left  Musculoskeletal: gait and station Limp left, muscle tone and strength are normal, no tremors or atrophy is present.  .  Neurological: coordination overall normal.  Deep tendon reflex/nerve stretch intact.  Sensation normal.  Cranial nerves II-XII intact.   Skin:   Normal overall no scars, lesions, ulcers or rashes. No psoriasis.  Psychiatric: Alert and oriented x 3.  Recent memory intact, remote memory unclear.  Normal mood and affect. Well groomed.  Good eye contact.  Cardiovascular: overall no swelling, no varicosities, no edema bilaterally, normal temperatures of the legs and arms, no clubbing, cyanosis and good capillary refill.  Lymphatic: palpation is normal.  Left fourth and third toes with slight swelling. NV intact. Alignment is good.    All other systems reviewed and are negative   The patient has been educated about the nature of the problem(s) and counseled on treatment options.  The patient appeared to understand what I have discussed and is in agreement with it.  No diagnosis found.  PLAN Call if any problems.  Precautions discussed.  Continue current medications.   Return to clinic 2 weeks   X-rays of the left foot on return.   Continue post op shoe and buddy taping toes.  Use gauze or cotton between toes.  Extra tape given.  Electronically Signed Sanjuana Kava, MD 1/23/20193:23 PM

## 2017-03-20 ENCOUNTER — Telehealth: Payer: Self-pay | Admitting: Family Medicine

## 2017-03-20 NOTE — Telephone Encounter (Signed)
Please note that the patient fell on 1-18, then went to Dr Aline Brochure on 1-23 and she has 2 broke toes

## 2017-03-24 NOTE — Telephone Encounter (Signed)
FYI

## 2017-04-01 ENCOUNTER — Ambulatory Visit: Payer: Medicaid Other | Admitting: Orthopaedic Surgery

## 2017-04-01 ENCOUNTER — Ambulatory Visit (INDEPENDENT_AMBULATORY_CARE_PROVIDER_SITE_OTHER): Payer: Medicaid Other

## 2017-04-01 ENCOUNTER — Encounter: Payer: Self-pay | Admitting: Orthopaedic Surgery

## 2017-04-01 DIAGNOSIS — S92502D Displaced unspecified fracture of left lesser toe(s), subsequent encounter for fracture with routine healing: Secondary | ICD-10-CM

## 2017-04-01 NOTE — Progress Notes (Signed)
CC:  My toe is better  She has no pain with the left little toe.  She has been buddy taping it appropriately.  She has normal NV status.  Encounter Diagnosis  Name Primary?  . Closed fracture of phalanx of left fourth toe with routine healing, subsequent encounter Yes   X-rays were done, reported separately.  Return in three weeks.  X-rays on return.  Call if any problem.  Precautions discussed.   Electronically Signed Sanjuana Kava, MD 2/6/20193:55 PM

## 2017-04-10 ENCOUNTER — Other Ambulatory Visit: Payer: Self-pay | Admitting: Family Medicine

## 2017-04-10 ENCOUNTER — Other Ambulatory Visit (HOSPITAL_COMMUNITY)
Admission: RE | Admit: 2017-04-10 | Discharge: 2017-04-10 | Disposition: A | Discharge status: Home / Self Care | Payer: Medicaid Other | Source: Ambulatory Visit | Attending: Family Medicine | Admitting: Family Medicine

## 2017-04-10 ENCOUNTER — Other Ambulatory Visit: Payer: Self-pay

## 2017-04-10 ENCOUNTER — Ambulatory Visit: Payer: Medicaid Other | Admitting: Family Medicine

## 2017-04-10 ENCOUNTER — Encounter: Payer: Self-pay | Admitting: Family Medicine

## 2017-04-10 VITALS — BP 128/76 | HR 97 | Temp 97.0°F | Resp 18 | Ht 65.0 in | Wt 249.0 lb

## 2017-04-10 DIAGNOSIS — N899 Noninflammatory disorder of vagina, unspecified: Secondary | ICD-10-CM | POA: Diagnosis not present

## 2017-04-10 DIAGNOSIS — R000 Tachycardia, unspecified: Secondary | ICD-10-CM | POA: Diagnosis not present

## 2017-04-10 DIAGNOSIS — R319 Hematuria, unspecified: Secondary | ICD-10-CM | POA: Insufficient documentation

## 2017-04-10 DIAGNOSIS — N3090 Cystitis, unspecified without hematuria: Secondary | ICD-10-CM

## 2017-04-10 LAB — POCT URINALYSIS DIPSTICK
Bilirubin, UA: NEGATIVE
Glucose, UA: NEGATIVE
Ketones, UA: NEGATIVE
Nitrite, UA: NEGATIVE
Protein, UA: 30
Spec Grav, UA: 1.025 (ref 1.010–1.025)
Urobilinogen, UA: 0.2 E.U./dL
pH, UA: 5.5 (ref 5.0–8.0)

## 2017-04-10 MED ORDER — CEPHALEXIN 500 MG PO CAPS: 500.0000 mg | ORAL_CAPSULE | Freq: Three times a day (TID) | ORAL | 0 refills | Status: DC

## 2017-04-10 NOTE — Patient Instructions (Addendum)
Take the antibiotic as directed Drink more water We will notify you of your test result  See Dr Moshe Cipro in March

## 2017-04-10 NOTE — Progress Notes (Signed)
Chief Complaint  Patient presents with  . Vaginal Bleeding   Usual PCP is [name] patient is here today to see me for an acute problem. She called yesterday, and a bit of a panic, because there was some spots of blood on the toilet tissue after urination. She is worried because after her hysterectomy she knows she is not supposed to have any bleeding. She is uncertain whether the bleeding is vaginal or urinary. No vaginal discharge, discomfort.  No sexual relations She has some mild suprapubic pain, she has not noticed frequency or dysuria. She has had recurring urinary tract infections in the past. No fever or chills.  No nausea or vomiting.   Patient Active Problem List   Diagnosis Date Noted  . CKD (chronic kidney disease) stage 3, GFR 30-59 ml/min (HCC) 04/03/2016  . GERD (gastroesophageal reflux disease) 08/23/2015  . Osteoarthritis of right knee 08/15/2014  . 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 Reduced vision 05/03/2013  . 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 Vitamin D deficiency 08/16/2009  . POLYNEUROPATHY 04/10/2009  . Hyperlipidemia LDL goal <100 07/15/2007  . Morbid obesity ([place]) 07/15/2007  . Anxiety and depression 07/15/2007  . MENTAL RETARDATION, MILD 07/15/2007  . Essential hypertension 07/15/2007  . Convulsions ([place]) 07/15/2007    Outpatient Encounter Medications as of 04/10/2017  Medication Sig  . acetaminophen (TYLENOL) 500 MG tablet Take 1 tablet (500 mg total) by mouth 2 (two) times daily.  . benazepril (LOTENSIN) 40 MG tablet TAKE (1) TABLET BY MOUTH DAILY.  . calcitRIOL (ROCALTROL) 0.25 MCG capsule Take 0.25 mcg by mouth every other day. Monday Wednesday and Friday  . ergocalciferol (VITAMIN D2) 50000 units capsule Take 1 capsule (50,000 Units total) by mouth once a week. One capsule once weekly (Patient taking differently: Take 50,000 Units by mouth every 30 (thirty) days. One capsule once weekly)  . FLUoxetine (PROZAC) 10  MG capsule TAKE (1) CAPSULE BY MOUTH ONCE DAILY.  . hydrochlorothiazide (HYDRODIURIL) 25 MG tablet TAKE ONE TABLET BY MOUTH ONCE DAILY.  Marland Kitchen HYDROcodone-acetaminophen (NORCO/VICODIN) 5-325 MG tablet Take 1 tablet by mouth every 4 (four) hours as needed.  . lamoTRIgine (LAMICTAL) 100 MG tablet Take 100 mg by mouth daily.  Marland Kitchen levETIRAcetam (KEPPRA) 750 MG tablet Take 1,500 mg by mouth 2 (two) times daily.  Marland Kitchen levothyroxine (SYNTHROID, LEVOTHROID) 125 MCG tablet TAKE 1 TABLET BY MOUTH DAILY BEFORE BREAKFAST.  Marland Kitchen loratadine (CLARITIN) 10 MG tablet TAKE ONE TABLET BY MOUTH ONCE DAILY.  . meloxicam (MOBIC) 15 MG tablet Take 1 tablet (15 mg total) by mouth daily.  Marland Kitchen omeprazole (PRILOSEC) 20 MG capsule TAKE 2 CAPSULES BY MOUTH ONCE DAILY.  . rosuvastatin (CRESTOR) 40 MG tablet TAKE 1 TABLET BY MOUTH ONCE A DAY.  . cephALEXin (KEFLEX) 500 MG capsule Take 1 capsule (500 mg total) by mouth 3 (three) times daily.   No facility-administered encounter medications on file as of 04/10/2017.     Allergies  Allergen Reactions  . Simvastatin Other (See Comments)    Muscle aches    Review of Systems  Constitutional: Negative for activity change, appetite change and unexpected weight change.  HENT: Negative for congestion and dental problem.   Eyes: Negative for photophobia and visual disturbance.  Respiratory: Negative for cough and shortness of breath.   Cardiovascular: Negative for chest pain and palpitations.  Gastrointestinal: Negative for blood in stool, constipation and diarrhea.  Genitourinary: Positive for hematuria. Negative for difficulty urinating, dysuria, flank pain, vaginal bleeding and vaginal  discharge.  Neurological: Positive for dizziness.       Patient states she chronically has dizziness  Hematological: Does not bruise/bleed easily.    BP 128/76 (BP Location: Other (Comment))   Pulse 97   Temp (!) 97 F (36.1 C) (Temporal)   Resp 18   Ht 5\' 5"  (1.651 m)   Wt 249 lb 0.6 oz (113 kg)    SpO2 99%   BMI 41.44 kg/m   Physical Exam  Constitutional: She appears well-developed and well-nourished. No distress.  HENT:  Head: Normocephalic and atraumatic.  Mouth/Throat: Oropharynx is clear and moist.  Cardiovascular: Normal rate, regular rhythm and normal heart sounds.  Tachycardic initially, occasional ectopy.  Resolved with rest.  EKG shows sinus tachycardia with PACs.  Pulmonary/Chest: Effort normal and breath sounds normal. She has no rales.  Abdominal: Soft. Bowel sounds are normal. There is no tenderness.  Genitourinary:  Genitourinary Comments: Examined external genitalia.  Normal urethra and introitus.  No discharge, malodorous.  Neurological: She is alert.  Psychiatric: She has a normal mood and affect. Her behavior is normal.    ASSESSMENT/PLAN:  1. Hematuria, unspecified type Likely UTI no external or vaginal source identified - Urine Culture - POCT urinalysis dipstick  2. Cystitis  - Urine Culture - POCT urinalysis dipstick  3. Vaginal disorder Possible BV - Urine cytology ancillary only  4. Sinus tachycardia Improved with rest.  Tachycardia on exertion.  Complaint of dizziness.  Drink more water.  Follow-up with primary care   Patient Instructions  Take the antibiotic as directed Drink more water We will notify you of your test result  See [name] in March   [name]

## 2017-04-12 LAB — URINE CULTURE
MICRO NUMBER:: 90208640
SPECIMEN QUALITY:: ADEQUATE

## 2017-04-13 LAB — URINE CYTOLOGY ANCILLARY ONLY
Chlamydia: NEGATIVE
Neisseria Gonorrhea: NEGATIVE
Trichomonas: NEGATIVE

## 2017-04-15 LAB — URINE CYTOLOGY ANCILLARY ONLY
Bacterial vaginitis: POSITIVE — AB
Candida vaginitis: NEGATIVE

## 2017-04-20 ENCOUNTER — Telehealth: Payer: Self-pay

## 2017-04-20 MED ORDER — METRONIDAZOLE 500 MG PO TABS: 500.0000 mg | ORAL_TABLET | Freq: Two times a day (BID) | ORAL | 0 refills | Status: AC

## 2017-04-20 NOTE — Telephone Encounter (Signed)
-----   Message from Raylene Everts, MD sent at 04/17/2017  4:41 PM EST ----- Please call [name], or her family, let them know she does have a mild vaginal infection.  She needs metronidazole 500 twice daily for 7 days

## 2017-04-20 NOTE — Progress Notes (Signed)
Called [name], aware, rx sent.

## 2017-04-21 ENCOUNTER — Other Ambulatory Visit: Payer: Self-pay | Admitting: Family Medicine

## 2017-04-22 ENCOUNTER — Ambulatory Visit: Payer: Medicaid Other | Admitting: Orthopaedic Surgery

## 2017-04-22 ENCOUNTER — Encounter: Payer: Self-pay | Admitting: Orthopaedic Surgery

## 2017-04-22 ENCOUNTER — Ambulatory Visit (INDEPENDENT_AMBULATORY_CARE_PROVIDER_SITE_OTHER): Payer: Medicaid Other

## 2017-04-22 DIAGNOSIS — S92502D Displaced unspecified fracture of left lesser toe(s), subsequent encounter for fracture with routine healing: Secondary | ICD-10-CM

## 2017-04-22 NOTE — Progress Notes (Signed)
CC:  My toe does not hurt  Her left fourth toe is not hurting.  She is wearing shoes now.  She has no new trauma.  NV intact.  Gait is good.  X-rays were done of the left foot, reported separately.  Encounter Diagnosis  Name Primary?  . Closed fracture of phalanx of left fourth toe with routine healing, subsequent encounter Yes   Return in one month, X-rays then  Call if any problem.  Precautions discussed.   Electronically Signed Sanjuana Kava, MD 2/27/20191:52 PM

## 2017-04-28 ENCOUNTER — Ambulatory Visit (INDEPENDENT_AMBULATORY_CARE_PROVIDER_SITE_OTHER): Payer: Medicaid Other | Admitting: Family Medicine

## 2017-04-28 ENCOUNTER — Encounter: Payer: Self-pay | Admitting: Family Medicine

## 2017-04-28 VITALS — BP 124/82 | HR 76 | Resp 16 | Ht 65.0 in | Wt 251.0 lb

## 2017-04-28 DIAGNOSIS — G40909 Epilepsy, unspecified, not intractable, without status epilepticus: Secondary | ICD-10-CM

## 2017-04-28 DIAGNOSIS — I10 Essential (primary) hypertension: Secondary | ICD-10-CM

## 2017-04-28 DIAGNOSIS — Z1211 Encounter for screening for malignant neoplasm of colon: Secondary | ICD-10-CM

## 2017-04-28 DIAGNOSIS — E785 Hyperlipidemia, unspecified: Secondary | ICD-10-CM | POA: Diagnosis not present

## 2017-04-28 DIAGNOSIS — F320 Major depressive disorder, single episode, mild: Secondary | ICD-10-CM

## 2017-04-28 DIAGNOSIS — Z1231 Encounter for screening mammogram for malignant neoplasm of breast: Secondary | ICD-10-CM

## 2017-04-28 DIAGNOSIS — R319 Hematuria, unspecified: Secondary | ICD-10-CM | POA: Diagnosis not present

## 2017-04-28 DIAGNOSIS — Z0000 Encounter for general adult medical examination without abnormal findings: Secondary | ICD-10-CM

## 2017-04-28 MED ORDER — HYDROCHLOROTHIAZIDE 25 MG PO TABS: 25.0000 mg | ORAL_TABLET | Freq: Every day | ORAL | 1 refills | Status: DC

## 2017-04-28 MED ORDER — LEVOTHYROXINE SODIUM 125 MCG PO TABS: ORAL_TABLET | ORAL | 5 refills | Status: DC

## 2017-04-28 MED ORDER — OMEPRAZOLE 20 MG PO CPDR: 40.0000 mg | DELAYED_RELEASE_CAPSULE | Freq: Every day | ORAL | 1 refills | Status: DC

## 2017-04-28 MED ORDER — ERGOCALCIFEROL 1.25 MG (50000 UT) PO CAPS: 50000.0000 [IU] | ORAL_CAPSULE | ORAL | 1 refills | Status: DC

## 2017-04-28 MED ORDER — LORATADINE 10 MG PO TABS: 10.0000 mg | ORAL_TABLET | Freq: Every day | ORAL | 5 refills | Status: DC

## 2017-04-28 MED ORDER — ROSUVASTATIN CALCIUM 40 MG PO TABS: 40.0000 mg | ORAL_TABLET | Freq: Every day | ORAL | 1 refills | Status: DC

## 2017-04-28 MED ORDER — BENAZEPRIL HCL 40 MG PO TABS: ORAL_TABLET | ORAL | 5 refills | Status: DC

## 2017-04-28 MED ORDER — FLUOXETINE HCL 10 MG PO CAPS: ORAL_CAPSULE | ORAL | 5 refills | Status: DC

## 2017-04-28 MED ORDER — LAMOTRIGINE 100 MG PO TABS: 100.0000 mg | ORAL_TABLET | Freq: Every day | ORAL | 3 refills | Status: DC

## 2017-04-28 MED ORDER — LEVETIRACETAM 750 MG PO TABS: ORAL_TABLET | ORAL | 3 refills | Status: DC

## 2017-04-28 NOTE — Patient Instructions (Signed)
F/u  in 4 months, call if you need me sooner  Please schedule mamogram due May 20 or after at checkout  Fasting lipid, cmp and eGFR and cBC 1 week before f/u in 4 months  You are referred to Dr Merlene Laughter, need to go  Urine being tested for blood if present you will need to see urologist  Please schedule and keep appt with Kidney specialist  Please work on good  health habits so that your health will improve. 1. Commitment to daily physical activity for 30 to 60  minutes, if you are able to do this.  2. Commitment to wise food choices. Aim for half of your  food intake to be vegetable and fruit, one quarter starchy foods, and one quarter protein. Try to eat on a regular schedule  3 meals per day, snacking between meals should be limited to vegetables or fruits or small portions of nuts. 64 ounces of water per day is generally recommended, unless you have specific health conditions, like heart failure or kidney failure where you will need to limit fluid intake.  3. Commitment to sufficient and a  good quality of physical and mental rest daily, generally between 6 to 8 hours per day.  WITH PERSISTANCE AND PERSEVERANCE, THE IMPOSSIBLE , BECOMES THE NORM! It is important that you exercise regularly at least 30 minutes 5 times a week. If you develop chest pain, have severe difficulty breathing, or feel very tired, stop exercising immediately and seek medical attention  Careful no more falls!

## 2017-04-28 NOTE — Assessment & Plan Note (Addendum)
Annual exam as documented. Counseling done  re healthy lifestyle involving commitment to 150 minutes exercise per week, heart healthy diet, and attaining healthy weight.The importance of adequate sleep also discussed.  I

## 2017-04-28 NOTE — Assessment & Plan Note (Signed)
Repeat test today,may ned urology eval if persist

## 2017-04-28 NOTE — Progress Notes (Signed)
    [name]     MRN: [id number]      DOB: 01/25/1956  HPI: Patient is in for annual physical exam. No other health concerns are addressed.   PE: BP 124/82   Pulse 76   Resp 16   Ht 5\' 5"  (1.651 m)   Wt 251 lb (113.9 kg)   SpO2 96%   BMI 41.77 kg/m   Pleasant  female, alert and oriented x 3, in no cardio-pulmonary distress. Afebrile. HEENT No facial trauma or asymetry. Sinuses non tender.  Extra occullar muscles intact, pupils equally reactive to light. External ears normal, tympanic membranes clear. Oropharynx moist, no exudate. Neck: supple, no adenopathy,JVD or thyromegaly.No bruits.  Chest: Clear to ascultation bilaterally.No crackles or wheezes. Non tender to palpation  Breast: No asymetry,no masses or lumps. No tenderness. No nipple discharge or inversion. No axillary or supraclavicular adenopathy  Cardiovascular system; Heart sounds normal,  S1 and  S2 ,no S3.  No murmur, or thrill. Apical beat not displaced Peripheral pulses normal.  Abdomen: Soft, non tender, no organomegaly or masses. No bruits. Bowel sounds normal. No guarding, tenderness or rebound.  Rectal:  Not examined  GU: Not examined   Musculoskeletal exam: Full ROM of spine, hips , shoulders and knees. No deformity ,swelling or crepitus noted. No muscle wasting or atrophy.   Neurologic: Cranial nerves 2 to 12 intact. Power, tone ,sensation and reflexes normal throughout. No disturbance in gait. No tremor.  Skin: Intact, no ulceration, erythema , scaling or rash noted. Pigmentation normal throughout  Psych; Normal mood and affect. Judgement and concentration normal   Assessment & Plan:  Annual physical exam Annual exam as documented. Counseling done  re healthy lifestyle involving commitment to 150 minutes exercise per week, heart healthy diet, and attaining healthy weight.The importance of adequate sleep also discussed.  I  Hematuria Repeat test today,may ned  urology eval if persist

## 2017-04-30 LAB — URINALYSIS W MICROSCOPIC + REFLEX CULTURE
Bacteria, UA: NONE SEEN /HPF
Bilirubin Urine: NEGATIVE
Glucose, UA: NEGATIVE
Hgb urine dipstick: NEGATIVE
Hyaline Cast: NONE SEEN /LPF
Ketones, ur: NEGATIVE
Nitrites, Initial: NEGATIVE
RBC / HPF: NONE SEEN /HPF (ref 0–2)
Specific Gravity, Urine: 1.023 (ref 1.001–1.03)
pH: <=5 (ref 5.0–8.0)

## 2017-04-30 LAB — URINE CULTURE
MICRO NUMBER:: 90287395
SPECIMEN QUALITY:: ADEQUATE

## 2017-04-30 LAB — CULTURE INDICATED

## 2017-05-03 ENCOUNTER — Encounter: Payer: Self-pay | Admitting: Family Medicine

## 2017-05-18 DIAGNOSIS — S92912A Unspecified fracture of left toe(s), initial encounter for closed fracture: Secondary | ICD-10-CM | POA: Insufficient documentation

## 2017-05-20 ENCOUNTER — Ambulatory Visit: Payer: Medicaid Other | Admitting: Orthopedic Surgery

## 2017-05-20 ENCOUNTER — Encounter: Payer: Self-pay | Admitting: Orthopedic Surgery

## 2017-05-20 ENCOUNTER — Ambulatory Visit (INDEPENDENT_AMBULATORY_CARE_PROVIDER_SITE_OTHER): Payer: Medicaid Other

## 2017-05-20 DIAGNOSIS — S92515D Nondisplaced fracture of proximal phalanx of left lesser toe(s), subsequent encounter for fracture with routine healing: Secondary | ICD-10-CM | POA: Diagnosis not present

## 2017-05-20 NOTE — Progress Notes (Signed)
Fracture care follow-up  Chief Complaint  Patient presents with  . Toe Injury    left 4th toe fracture 03/15/17    Encounter Diagnosis  Name Primary?  . Closed nondisplaced fracture of proximal phalanx of lesser toe of left foot with routine healing, subsequent encounter 03/15/08     Complains of intermittent pain she is due for x-ray today    Current Outpatient Medications:  .  acetaminophen (TYLENOL) 500 MG tablet, Take 1 tablet (500 mg total) by mouth 2 (two) times daily., Disp: 60 tablet, Rfl: 5 .  benazepril (LOTENSIN) 40 MG tablet, TAKE (1) TABLET BY MOUTH DAILY., Disp: 30 tablet, Rfl: 5 .  calcitRIOL (ROCALTROL) 0.25 MCG capsule, Take 0.25 mcg by mouth every other day. Monday Wednesday and Friday, Disp: , Rfl:  .  ergocalciferol (VITAMIN D2) 50000 units capsule, Take 1 capsule (50,000 Units total) by mouth once a week. One capsule once weekly, Disp: 12 capsule, Rfl: 1 .  FLUoxetine (PROZAC) 10 MG capsule, TAKE (1) CAPSULE BY MOUTH ONCE DAILY., Disp: 30 capsule, Rfl: 5 .  hydrochlorothiazide (HYDRODIURIL) 25 MG tablet, Take 1 tablet (25 mg total) by mouth daily., Disp: 90 tablet, Rfl: 1 .  lamoTRIgine (LAMICTAL) 100 MG tablet, Take 1 tablet (100 mg total) by mouth daily., Disp: 30 tablet, Rfl: 3 .  levETIRAcetam (KEPPRA) 750 MG tablet, Two tablets twice daily, Disp: 120 tablet, Rfl: 3 .  levothyroxine (SYNTHROID, LEVOTHROID) 125 MCG tablet, TAKE 1 TABLET BY MOUTH DAILY BEFORE BREAKFAST., Disp: 30 tablet, Rfl: 5 .  loratadine (CLARITIN) 10 MG tablet, Take 1 tablet (10 mg total) by mouth daily., Disp: 30 tablet, Rfl: 5 .  omeprazole (PRILOSEC) 20 MG capsule, Take 2 capsules (40 mg total) by mouth daily., Disp: 180 capsule, Rfl: 1 .  rosuvastatin (CRESTOR) 40 MG tablet, Take 1 tablet (40 mg total) by mouth daily., Disp: 90 tablet, Rfl: 1  BP 119/75   Pulse 95   Ht 5\' 5"  (1.651 m)   Wt 250 lb (113.4 kg)   BMI 41.60 kg/m   Physical Exam   Xrays: X-rays show a T type fracture  nondisplaced appears healed although fracture line still visible these tend to lag  Plan  Weight-bear as tolerated follow-up as needed use Crisco for dry skin

## 2017-05-26 ENCOUNTER — Ambulatory Visit (HOSPITAL_COMMUNITY): Admission: RE | Admit: 2017-05-26 | Payer: Medicaid Other | Source: Ambulatory Visit

## 2017-05-27 LAB — T4, FREE: Free T4: 1.2 ng/dL (ref 0.8–1.8)

## 2017-05-27 LAB — HEMOGLOBIN A1C
Hgb A1c MFr Bld: 5.2 % of total Hgb (ref <5.7)
Mean Plasma Glucose: 103 (calc)
eAG (mmol/L): 5.7 (calc)

## 2017-05-27 LAB — TSH: TSH: 2.28 mIU/L (ref 0.40–4.50)

## 2017-05-28 ENCOUNTER — Encounter: Payer: Self-pay | Admitting: "Endocrinology

## 2017-05-28 ENCOUNTER — Ambulatory Visit (INDEPENDENT_AMBULATORY_CARE_PROVIDER_SITE_OTHER): Payer: Medicaid Other | Admitting: "Endocrinology

## 2017-05-28 VITALS — BP 121/76 | HR 81 | Ht 65.0 in | Wt 249.0 lb

## 2017-05-28 DIAGNOSIS — C73 Malignant neoplasm of thyroid gland: Secondary | ICD-10-CM | POA: Diagnosis not present

## 2017-05-28 DIAGNOSIS — I10 Essential (primary) hypertension: Secondary | ICD-10-CM

## 2017-05-28 DIAGNOSIS — E890 Postprocedural hypothyroidism: Secondary | ICD-10-CM | POA: Diagnosis not present

## 2017-05-28 NOTE — Progress Notes (Signed)
Subjective:    Patient ID: [name], female    DOB: 1955-09-16,    Past Medical History:  Diagnosis Date  . Depression   . Fractures   . Hyperlipidemia   . Hypertension   . Mental retardation, mild (I.Q. 50-70)   . Obesity   . Seizure ([place])    11/17/2012 "still having them; "I believe their from a tumor in my head" ((11/17/2012  . Seizure disorder ([place])   . Thyroid cancer [facility])    Past Surgical History:  Procedure Laterality Date  . ABDOMINAL HYSTERECTOMY  2001  . BREAST LUMPECTOMY  1996   right   . COLONOSCOPY N/A 05/16/2016   Procedure: COLONOSCOPY;  Surgeon: Danie Binder, MD;  Location: [facility];  Service: Endoscopy;  Laterality: N/A;  830   . THYROIDECTOMY Bilateral 11/17/2012   Procedure: TOTAL THYROIDECTOMY;  Surgeon: Ascencion Dike, MD;  Location: [place];  Service: ENT;  Laterality: Bilateral;  . TOTAL THYROIDECTOMY Bilateral 11/17/2012  . TUBAL LIGATION     Social History   Socioeconomic History  . Marital status: Single    Spouse name: Not on file  . Number of children: 2  . Years of education: Not on file  . Highest education level: Not on file  Occupational History  . Occupation: disabled   Social Needs  . Financial resource strain: Not on file  . Food insecurity:    Worry: Not on file    Inability: Not on file  . Transportation needs:    Medical: Not on file    Non-medical: Not on file  Tobacco Use  . Smoking status: Never Smoker  . Smokeless tobacco: Never Used  Substance and Sexual Activity  . Alcohol use: No  . Drug use: No  . Sexual activity: Never  Lifestyle  . Physical activity:    Days per week: Not on file    Minutes per session: Not on file  . Stress: Not on file  Relationships  . Social connections:    Talks on phone: Not on file    Gets together: Not on file    Attends religious service: Not on file    Active member of club or organization: Not on file    Attends meetings of clubs or organizations: Not on file     Relationship status: Not on file  Other Topics Concern  . Not on file  Social History Narrative  . Not on file   Outpatient Encounter Medications as of 05/28/2017  Medication Sig  . acetaminophen (TYLENOL) 500 MG tablet Take 1 tablet (500 mg total) by mouth 2 (two) times daily.  . benazepril (LOTENSIN) 40 MG tablet TAKE (1) TABLET BY MOUTH DAILY.  . calcitRIOL (ROCALTROL) 0.25 MCG capsule Take 0.25 mcg by mouth every other day. Monday Wednesday and Friday  . ergocalciferol (VITAMIN D2) 50000 units capsule Take 1 capsule (50,000 Units total) by mouth once a week. One capsule once weekly  . FLUoxetine (PROZAC) 10 MG capsule TAKE (1) CAPSULE BY MOUTH ONCE DAILY.  . hydrochlorothiazide (HYDRODIURIL) 25 MG tablet Take 1 tablet (25 mg total) by mouth daily.  Marland Kitchen lamoTRIgine (LAMICTAL) 100 MG tablet Take 1 tablet (100 mg total) by mouth daily.  Marland Kitchen levETIRAcetam (KEPPRA) 750 MG tablet Two tablets twice daily  . levothyroxine (SYNTHROID, LEVOTHROID) 125 MCG tablet TAKE 1 TABLET BY MOUTH DAILY BEFORE BREAKFAST.  Marland Kitchen loratadine (CLARITIN) 10 MG tablet Take 1 tablet (10 mg total) by mouth daily.  Marland Kitchen  omeprazole (PRILOSEC) 20 MG capsule Take 2 capsules (40 mg total) by mouth daily.  . rosuvastatin (CRESTOR) 40 MG tablet Take 1 tablet (40 mg total) by mouth daily.   No facility-administered encounter medications on file as of 05/28/2017.    ALLERGIES: Allergies  Allergen Reactions  . Simvastatin Other (See Comments)    Muscle aches   VACCINATION STATUS: Immunization History  Administered Date(s) Administered  . H1N1 12/16/2007  . Influenza Split 11/24/2011  . Influenza Whole 11/30/2006, 12/07/2008, 12/26/2009  . Influenza,inj,Quad PF,6+ Mos 11/18/2012, 12/06/2013, 05/23/2015, 01/29/2016, 12/01/2016  . Pneumococcal Conjugate-13 04/03/2014  . Td 08/22/2003  . Tdap 04/02/2016    HPI  62 yr old female with medical hx as above . She is here to follow-up for post surgical hypothyroidism and for follow-up  of Papillary  thyroid cancer. 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 She has had  Thyrogen stimulated remnant ablation with negative post therapy WBS in 2014.    She is now on Synthroid 125 g by mouth every morning.  - An attempt to biopsy the left cervical 0.9 cm lymph node was abandoned because " no evidence of adenopathy".  - Second Thyrogen Stimulated whole body scan for surveillance shows no scintigraphic evidence of iodine avid metastasis papillary thyroid cancer on 04/18/2016. -She was supposed to have surveillance thyroid/neck ultrasound, not been due to various reasons including Medicaid noncoverage for the study as well as patient noncompliance. -She is compliant  to her thyroid hormone ,denies any new complaints.  -She is now scheduled to do surveillance thyroid/neck ultrasound on June 01, 2017. she denies palpitations. she denies dysphagia, SOB, nor voice change. she denies family history of thyroid cancer.  Review of Systems  Constitutional: Negative for fatigue.  HENT: Negative for trouble swallowing and voice change.   Eyes: Negative for visual disturbance.  Respiratory: Negative for cough, shortness of breath and wheezing.   Cardiovascular: Negative for chest pain, palpitations and leg swelling.  Gastrointestinal: Negative for diarrhea, nausea and vomiting.  Endocrine: Negative for cold intolerance, heat intolerance, polydipsia, polyphagia and polyuria.  Musculoskeletal: Negative for arthralgias and myalgias.  Skin: Negative for color change, pallor, rash and wound.  Neurological: Negative for seizures and headaches.  Psychiatric/Behavioral: Negative for confusion and suicidal ideas.    Objective:    BP 121/76   Pulse 81   Ht 5\' 5"  (1.651 m)   Wt 249 lb (112.9 kg)   BMI 41.44 kg/m   Wt Readings from Last 3 Encounters:  05/28/17 249 lb (112.9  kg)  05/20/17 250 lb (113.4 kg)  04/28/17 251 lb (113.9 kg)    Physical Exam  Constitutional: She is oriented to person, place, and time. She appears well-developed.  HENT:  Head: Normocephalic and atraumatic.  Eyes: EOM are normal.  Neck: Normal range of motion. Neck supple. No tracheal deviation present.  Healed scar from prior total thyroidectomy.  No gross mass lesions on physical exam.  Cardiovascular: Normal rate and regular rhythm.  Pulmonary/Chest: Effort normal.  Abdominal: There is no tenderness. There is no guarding.  Musculoskeletal: Normal range of motion. She exhibits no edema.  Neurological: She is alert and oriented to person, place, and time. She has normal reflexes. No cranial nerve deficit. Coordination normal.  Skin: Skin is warm and dry. No rash noted. No erythema. No pallor.  Psychiatric: She has a normal mood and affect. Judgment normal.  Results for orders placed or performed in visit on 04/28/17  Urine Culture  Result Value Ref Range   MICRO NUMBER: [id number]    SPECIMEN QUALITY: ADEQUATE    Sample Source URINE    STATUS: FINAL    Result:      Multiple organisms present, each less than 10,000 CFU/mL. These organisms, commonly found on external and internal genitalia, are considered to be colonizers. No further testing performed.  Urinalysis with Culture Reflex  Result Value Ref Range   Color, Urine YELLOW YELLOW   APPearance CLEAR CLEAR   Specific Gravity, Urine 1.023 1.001 - 1.03   pH < OR = 5.0 5.0 - 8.0   Glucose, UA NEGATIVE NEGATIVE   Bilirubin Urine NEGATIVE NEGATIVE   Ketones, ur NEGATIVE NEGATIVE   Hgb urine dipstick NEGATIVE NEGATIVE   Protein, ur 1+ (A) NEGATIVE   Nitrites, Initial NEGATIVE NEGATIVE   Leukocyte Esterase 1+ (A) NEGATIVE   WBC, UA 10-20 (A) 0 - 5 /HPF   RBC / HPF NONE SEEN 0 - 2 /HPF   Squamous Epithelial / LPF 6-10 (A) < OR = 5 /HPF   Bacteria, UA NONE SEEN NONE SEEN /HPF   Hyaline Cast NONE SEEN NONE SEEN /LPF   REFLEXIVE URINE CULTURE  Result Value Ref Range   REFLEXIVE URINE CULTURE CULTURE INDICATED - RESULTS TO FOLLOW    Complete Blood Count (Most recent): Lab Results  Component Value Date   WBC 5.5 04/06/2016   HGB 11.5 (L) 04/06/2016   HCT 34.6 (L) 04/06/2016   MCV 85.2 04/06/2016   PLT 221 04/06/2016   Chemistry (most recent): Lab Results  Component Value Date   NA 137 11/24/2016   K 4.3 11/24/2016   CL 102 11/24/2016   CO2 27 11/24/2016   BUN 39 (H) 11/24/2016   CREATININE 2.03 (H) 11/24/2016   Diabetic Labs (most recent): Lab Results  Component Value Date   HGBA1C 5.2 05/26/2017   HGBA1C 5.3 08/10/2012   Lipid Panel     Component Value Date/Time   CHOL 193 11/24/2016 0854   CHOL 231 (H) 06/18/2016 0954   TRIG 94 11/24/2016 0854   HDL 49 (L) 11/24/2016 0854   HDL 46 06/18/2016 0954   CHOLHDL 3.9 11/24/2016 0854   VLDL 14 01/29/2016 1109   LDLCALC 124 (H) 11/24/2016 0854   Results for EDMUND, RICK (MRN [id number]) as of 05/28/2017 09:37  Ref. Range 10/20/2016 09:00 05/26/2017 08:34  TSH Latest Ref Range: 0.40 - 4.50 mIU/L 0.09 (L) 2.28  T4,Free(Direct) Latest Ref Range: 0.8 - 1.8 ng/dL 1.5 1.2    Assessment & Plan:   1. Hypothyroidism, postsurgical -Her repeat thyroid function tests are consistent with appropriate replacement. - I advised her to continue  levothyroxine  125 g by mouth every morning.  - We discussed about correct intake of levothyroxine, at fasting, with water, separated by at least 30 minutes from breakfast, and separated by more than 4 hours from calcium, iron, multivitamins, acid reflux medications (PPIs). -Patient is made aware of the fact that thyroid hormone replacement is needed for life, dose to be adjusted by periodic monitoring of thyroid function tests. - Target TSH for her would be between 0.1-0.5.  2. Papillary thyroid carcinoma ([place]) Her  surveillance neck /thyroid u/s from 11/17/2013 was c/w surgically absent thyroid, however  there is a 0.9 cm left cervical lymph node. See below.  Pt is s/p thyrogen stimulated thyroid remnant ablation with WBS on 01/14/2013 with no evidence of  iodine avid metastasis. She has had pT2,Nx,Mx FVPTC, s/p near total thyroidectomy on September 24,2014. She is counseled to maintain high degree of compliance with synthroid therapy with aim of keeping TSH low normal ( 0.1-0.5).  -she is advised on the necessity of follow-ups and imaging studies at least one annually for the next 5 years. - She was sent for core biopsy of 0.9 cm left cervical lymph node last visit. However, this procedure was abandoned due to " no evidence of adenopathy".  The previously noticed  left cervical lymph node was sent to decrease in size to 0.5 cm. Medicaid did not cover surveillance ultrasound. - Second Thyrogen estimated or body scan for surveillance shows no scintigraphic evidence of iodine avid metastasis papillary thyroid cancer on 04/18/2016. - During last attempt, Medicaid did not pay for surveillance thyroid/neck ultrasound.  She is scheduled for a repeat surveillance thyroid/neck ultrasound on June 01, 2017.  I urged her not to miss that appointment.    3. Essential hypertension -Her blood pressure is controlled to target. Patient is advised to be consistent taking her Benazepril  20 mg by mouth daily and hydrochlorothiazide 25 mg by mouth daily.  4. Obesity, Class II, BMI 35.0-39.9, with comorbidity (see actual BMI) ([facility]CC) -No significant success in weight loss. -  Suggestion is made for her to avoid simple carbohydrates  from her diet including Cakes, Sweet Desserts / Pastries, Ice Cream, Soda (diet and regular), Sweet Tea, Candies, Chips, Cookies, Store Bought Juices, Alcohol in Excess of  1-2 drinks a day, Artificial Sweeteners, and "Sugar-free" Products. This will help patient to have stable blood glucose profile and potentially avoid unintended weight gain.  - Time spent with the patient: 25 min, of  which >50% was spent in reviewing her  current and  previous labs, previous treatments for thyroid cancer, for postsurgical hypothyroidism, hypertension, obesity, and medications doses and developing a plan for long-term care.  Delrae Alfred Golab participated in the discussions, expressed understanding, and voiced agreement with the above plans.  All questions were answered to her satisfaction. she is encouraged to contact clinic should she have any questions or concerns prior to her return visit.  Follow up plan: Return in about 3 months (around 08/27/2017) for follow up with pre-visit labs, Thyroid / Neck Ultrasound.  Glade Lloyd, MD Phone: [phone]  Fax: [phone]  -  This note was partially dictated with voice recognition software. Similar sounding words can be transcribed inadequately or may not  be corrected upon review.   05/28/2017, 9:54 AM

## 2017-05-28 NOTE — Patient Instructions (Signed)

## 2017-06-01 ENCOUNTER — Ambulatory Visit (HOSPITAL_COMMUNITY)
Admission: RE | Admit: 2017-06-01 | Discharge: 2017-06-01 | Disposition: A | Discharge status: Home / Self Care | Payer: Medicaid Other | Source: Ambulatory Visit | Attending: "Endocrinology | Admitting: "Endocrinology

## 2017-06-01 DIAGNOSIS — C73 Malignant neoplasm of thyroid gland: Secondary | ICD-10-CM

## 2017-06-03 ENCOUNTER — Telehealth: Payer: Self-pay | Admitting: Family Medicine

## 2017-06-03 NOTE — Telephone Encounter (Signed)
[name] left message on nurse line stating that patient has complained recently of bright red blood on toilet paper following bowel movements. She states she has advised patient to go to ED if she sees it again or if she is experiencing dizziness.   Is this something that can wait for Dr Moshe Cipro or does she need a sooner evaluation?

## 2017-06-04 NOTE — Telephone Encounter (Signed)
Patient informed of message below, verbalized understanding.  

## 2017-06-04 NOTE — Telephone Encounter (Signed)
First - she does NOT need to go to the ER for blood on tissue after a BM.  Go to ER only if bleeding is uncontrolled. Second -  She needs to be counseled to use a stool softener or miralax for a few days to keep bowels soft.  Use a moistened wipe in place of tissue.  This allows a hemorrhoid or small tear to heal.   Third - she can see Dr Moshe Cipro at next available appt.

## 2017-07-03 ENCOUNTER — Encounter: Payer: Self-pay | Admitting: Diagnostic Neuroimaging

## 2017-07-03 ENCOUNTER — Ambulatory Visit: Payer: Medicaid Other | Admitting: Diagnostic Neuroimaging

## 2017-07-03 VITALS — BP 122/77 | HR 97 | Resp 20 | Ht 65.0 in | Wt 249.0 lb

## 2017-07-03 DIAGNOSIS — G40909 Epilepsy, unspecified, not intractable, without status epilepticus: Secondary | ICD-10-CM

## 2017-07-03 NOTE — Progress Notes (Signed)
[facility]  PATIENT: [name] DOB: 1955-08-17  REFERRING CLINICIAN: Bari Mantis, MD HISTORY FROM: patient and chart review  REASON FOR VISIT: new consult   HISTORICAL  CHIEF COMPLAINT:  Chief Complaint  Patient presents with  . Seizures    [name] is here to transfer [place]. d/o to Dr. Leta Baptist.  Sts. onset of sz. as a child, last sz. yrs. ago. Reports compliance with Lamictal and Keppra/fim    HISTORY OF PRESENT ILLNESS:   62 year old female here for evaluation of seizure disorder.  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  She was previously on Dilantin.  At some point she was switched to Power and Lamictal.  Patient has not had any further seizures since about the year 2000.  She previously saw a neurologist but does not know who it was.  We do not have any prior records.  Patient does not have any other details regarding her seizure history.   REVIEW OF SYSTEMS: Full 14 system review of systems performed and negative with exception of: As per HPI.  ALLERGIES: Allergies  Allergen Reactions  . Simvastatin Other (See Comments)    Muscle aches    HOME MEDICATIONS: Outpatient Medications Prior to Visit  Medication Sig Dispense Refill  . acetaminophen (TYLENOL) 500 MG tablet Take 1 tablet (500 mg total) by mouth 2 (two) times daily. 60 tablet 5  . benazepril (LOTENSIN) 40 MG tablet TAKE (1) TABLET BY MOUTH DAILY. 30 tablet 5  . calcitRIOL (ROCALTROL) 0.25 MCG capsule Take 0.25 mcg by mouth every other day. Monday Wednesday and Friday    . ergocalciferol (VITAMIN D2) 50000 units capsule Take 1 capsule (50,000 Units total) by mouth once a week. One capsule once weekly 12 capsule 1  . FLUoxetine (PROZAC) 10 MG capsule TAKE (1) CAPSULE BY MOUTH ONCE DAILY. 30 capsule 5  . hydrochlorothiazide (HYDRODIURIL) 25 MG tablet Take 1 tablet (25 mg total) by mouth daily. 90 tablet 1  . lamoTRIgine (LAMICTAL) 100 MG tablet Take 1 tablet (100 mg total)  by mouth daily. 30 tablet 3  . levETIRAcetam (KEPPRA) 750 MG tablet Two tablets twice daily 120 tablet 3  . levothyroxine (SYNTHROID, LEVOTHROID) 125 MCG tablet TAKE 1 TABLET BY MOUTH DAILY BEFORE BREAKFAST. 30 tablet 5  . loratadine (CLARITIN) 10 MG tablet Take 1 tablet (10 mg total) by mouth daily. 30 tablet 5  . omeprazole (PRILOSEC) 20 MG capsule Take 2 capsules (40 mg total) by mouth daily. 180 capsule 1  . rosuvastatin (CRESTOR) 40 MG tablet Take 1 tablet (40 mg total) by mouth daily. 90 tablet 1   No facility-administered medications prior to visit.     PAST MEDICAL HISTORY: Past Medical History:  Diagnosis Date  . Depression   . Fractures   . Hyperlipidemia   . Hypertension   . Mental retardation, mild (I.Q. 50-70)   . Obesity   . Seizure ([place])    11/17/2012 "still having them; "I believe their from a tumor in my head" ((11/17/2012  . Seizure disorder ([place])   . Thyroid cancer ([place])     PAST SURGICAL HISTORY: Past Surgical History:  Procedure Laterality Date  . ABDOMINAL HYSTERECTOMY  2001  . BREAST LUMPECTOMY  1996   right   . COLONOSCOPY N/A 05/16/2016   Procedure: COLONOSCOPY;  Surgeon: Danie Binder, MD;  Location: [facility];  Service: Endoscopy;  Laterality: N/A;  830   . THYROIDECTOMY Bilateral 11/17/2012   Procedure: TOTAL THYROIDECTOMY;  Surgeon: Evelena Peat  Cassie Freer, MD;  Location: [facility];  Service: ENT;  Laterality: Bilateral;  . TOTAL THYROIDECTOMY Bilateral 11/17/2012  . TUBAL LIGATION      FAMILY HISTORY: Family History  Problem Relation Age of Onset  . Diabetes Mother     SOCIAL HISTORY:  Social History   Socioeconomic History  . Marital status: Single    Spouse name: Not on file  . Number of children: 2  . Years of education: Not on file  . Highest education level: Not on file  Occupational History  . Occupation: disabled   Social Needs  . Financial resource strain: Not on file  . Food insecurity:    Worry: Not on file    Inability: Not on file   . Transportation needs:    Medical: Not on file    Non-medical: Not on file  Tobacco Use  . Smoking status: Never Smoker  . Smokeless tobacco: Never Used  Substance and Sexual Activity  . Alcohol use: No  . Drug use: No  . Sexual activity: Never  Lifestyle  . Physical activity:    Days per week: Not on file    Minutes per session: Not on file  . Stress: Not on file  Relationships  . Social connections:    Talks on phone: Not on file    Gets together: Not on file    Attends religious service: Not on file    Active member of club or organization: Not on file    Attends meetings of clubs or organizations: Not on file    Relationship status: Not on file  . Intimate partner violence:    Fear of current or ex partner: Not on file    Emotionally abused: Not on file    Physically abused: Not on file    Forced sexual activity: Not on file  Other Topics Concern  . Not on file  Social History Narrative  . Not on file     PHYSICAL EXAM  GENERAL EXAM/CONSTITUTIONAL: Vitals:  Vitals:   07/03/17 1107  BP: 122/77  Pulse: 97  Resp: 20  Weight: 249 lb (112.9 kg)  Height: 5\' 5"  (1.651 m)     Body mass index is 41.44 kg/m.  No exam data present  Patient is in no distress; well developed, nourished and groomed; neck is supple  CARDIOVASCULAR:  Examination of carotid arteries is normal; no carotid bruits  Regular rate and rhythm, no murmurs  Examination of peripheral vascular system by observation and palpation is normal  EYES:  Ophthalmoscopic exam of optic discs and posterior segments is normal; no papilledema or hemorrhages  MUSCULOSKELETAL:  Gait, strength, tone, movements noted in Neurologic exam below  NEUROLOGIC: MENTAL STATUS:  No flowsheet data found.  awake, alert, oriented to person, place and time  recent and remote memory intact  normal attention and concentration  language fluent, comprehension intact, naming intact,   fund of knowledge  appropriate  CRANIAL NERVE:   2nd - no papilledema on fundoscopic exam  2nd, 3rd, 4th, 6th - pupils equal and reactive to light, visual fields full to confrontation, extraocular muscles intact, no nystagmus  5th - facial sensation symmetric  7th - facial strength symmetric  8th - hearing intact  9th - palate elevates symmetrically, uvula midline  11th - shoulder shrug symmetric  12th - tongue protrusion midline  MOTOR:   normal bulk and tone, full strength in the BUE, BLE  SENSORY:   normal and symmetric to light touch, pinprick,  temperature, vibration  COORDINATION:   finger-nose-finger, fine finger movements --> MILD DYSMETRIA WITH LUE  REFLEXES:   deep tendon reflexes TRACE and symmetric  GAIT/STATION:   narrow based gait    DIAGNOSTIC DATA (LABS, IMAGING, TESTING) - I reviewed patient records, labs, notes, testing and imaging myself where available.  Lab Results  Component Value Date   WBC 5.5 04/06/2016   HGB 11.5 (L) 04/06/2016   HCT 34.6 (L) 04/06/2016   MCV 85.2 04/06/2016   PLT 221 04/06/2016      Component Value Date/Time   NA 137 11/24/2016 0854   NA 140 06/18/2016 0954   K 4.3 11/24/2016 0854   CL 102 11/24/2016 0854   CO2 27 11/24/2016 0854   GLUCOSE 93 11/24/2016 0854   BUN 39 (H) 11/24/2016 0854   BUN 32 (H) 06/18/2016 0954   CREATININE 2.03 (H) 11/24/2016 0854   CALCIUM 9.8 11/24/2016 0854   PROT 7.9 11/24/2016 0854   PROT 8.0 06/18/2016 0954   ALBUMIN 4.1 06/18/2016 0954   AST 17 11/24/2016 0854   ALT 12 11/24/2016 0854   ALKPHOS 138 (H) 06/18/2016 0954   BILITOT 0.5 11/24/2016 0854   BILITOT 0.4 06/18/2016 0954   GFRNONAA 26 (L) 11/24/2016 0854   GFRAA 30 (L) 11/24/2016 0854   Lab Results  Component Value Date   CHOL 193 11/24/2016   HDL 49 (L) 11/24/2016   LDLCALC 124 (H) 11/24/2016   TRIG 94 11/24/2016   CHOLHDL 3.9 11/24/2016   Lab Results  Component Value Date   HGBA1C 5.2 05/26/2017   Lab Results    Component Value Date   KGYJEHUD14 970 07/12/2013   Lab Results  Component Value Date   TSH 2.28 05/26/2017    07/22/13 MRI brain [I reviewed images myself and agree with interpretation. -VRP]  1. No evidence of acute intracranial abnormality. 2. Unchanged, marked cerebellar atrophy. 3. Asymmetric atrophy and mild signal abnormality of the left hippocampus, query mesial temporal sclerosis.     ASSESSMENT AND PLAN  62 y.o. year old female here with seizure disorder since infancy, now well controlled.  No seizures since about the year 2000.  Patient is stable on current medications.  I recommend patient stay on current dose of medication long-term.  If patient has breakthrough seizures or recurrence I would be happy to see patient again for further management.  She may follow-up in neurology clinic as needed, with refills per PCP.   Dx:  1. Seizure disorder ([place])      PLAN:  SEIZURE DISORDER (stable; well controlled; last seizure? ~2000?) - continue levetiracetam 1500mg  twice a day  - continue lamotrigine 100mg  daily - may follow up in neurology clinic as needed  Return if symptoms worsen or fail to improve, for return to PCP.    Penni Bombard, MD 2/63/7858, 85:02 AM Certified in Neurology, Neurophysiology and Neuroimaging  [facility] [address], [place],  [address] [phone]

## 2017-07-03 NOTE — Patient Instructions (Signed)
-   continue lamotrigine and levetiracetam for seizure prevention  - follow up as needed

## 2017-07-13 ENCOUNTER — Ambulatory Visit (HOSPITAL_COMMUNITY)
Admission: RE | Admit: 2017-07-13 | Discharge: 2017-07-13 | Disposition: A | Discharge status: Home / Self Care | Payer: Medicaid Other | Source: Ambulatory Visit | Attending: Family Medicine | Admitting: Family Medicine

## 2017-07-13 ENCOUNTER — Encounter (HOSPITAL_COMMUNITY): Payer: Self-pay

## 2017-07-13 DIAGNOSIS — Z1231 Encounter for screening mammogram for malignant neoplasm of breast: Secondary | ICD-10-CM | POA: Insufficient documentation

## 2017-07-21 DIAGNOSIS — F78 Other intellectual disabilities: Secondary | ICD-10-CM | POA: Diagnosis not present

## 2017-07-21 DIAGNOSIS — I10 Essential (primary) hypertension: Secondary | ICD-10-CM | POA: Diagnosis not present

## 2017-07-21 DIAGNOSIS — R569 Unspecified convulsions: Secondary | ICD-10-CM | POA: Diagnosis not present

## 2017-08-21 LAB — LIPID PANEL
Cholesterol: 199 mg/dL (ref <200)
HDL: 50 mg/dL — ABNORMAL LOW (ref >50)
LDL Cholesterol (Calc): 132 mg/dL (calc) — ABNORMAL HIGH
Non-HDL Cholesterol (Calc): 149 mg/dL (calc) — ABNORMAL HIGH (ref <130)
Total CHOL/HDL Ratio: 4 (calc) (ref <5.0)
Triglycerides: 74 mg/dL (ref <150)

## 2017-08-21 LAB — COMPLETE METABOLIC PANEL WITH GFR
AG Ratio: 1 (calc) (ref 1.0–2.5)
ALT: 11 U/L (ref 6–29)
AST: 18 U/L (ref 10–35)
Albumin: 4 g/dL (ref 3.6–5.1)
Alkaline phosphatase (APISO): 124 U/L (ref 33–130)
BUN/Creatinine Ratio: 21 (calc) (ref 6–22)
BUN: 49 mg/dL — ABNORMAL HIGH (ref 7–25)
CO2: 26 mmol/L (ref 20–32)
Calcium: 9.3 mg/dL (ref 8.6–10.4)
Chloride: 105 mmol/L (ref 98–110)
Creat: 2.37 mg/dL — ABNORMAL HIGH (ref 0.50–0.99)
GFR, Est African American: 24 mL/min/{1.73_m2} — ABNORMAL LOW (ref >=60)
GFR, Est Non African American: 21 mL/min/{1.73_m2} — ABNORMAL LOW (ref >=60)
Globulin: 3.9 g/dL (calc) — ABNORMAL HIGH (ref 1.9–3.7)
Glucose, Bld: 93 mg/dL (ref 65–99)
Potassium: 4.4 mmol/L (ref 3.5–5.3)
Sodium: 141 mmol/L (ref 135–146)
Total Bilirubin: 0.5 mg/dL (ref 0.2–1.2)
Total Protein: 7.9 g/dL (ref 6.1–8.1)

## 2017-08-21 LAB — CBC
HCT: 33.3 % — ABNORMAL LOW (ref 35.0–45.0)
Hemoglobin: 10.9 g/dL — ABNORMAL LOW (ref 11.7–15.5)
MCH: 27.4 pg (ref 27.0–33.0)
MCHC: 32.7 g/dL (ref 32.0–36.0)
MCV: 83.7 fL (ref 80.0–100.0)
MPV: 11.2 fL (ref 7.5–12.5)
Platelets: 213 10*3/uL (ref 140–400)
RBC: 3.98 10*6/uL (ref 3.80–5.10)
RDW: 13.6 % (ref 11.0–15.0)
WBC: 4.5 10*3/uL (ref 3.8–10.8)

## 2017-08-31 ENCOUNTER — Encounter: Payer: Self-pay | Admitting: Family Medicine

## 2017-08-31 ENCOUNTER — Ambulatory Visit (INDEPENDENT_AMBULATORY_CARE_PROVIDER_SITE_OTHER): Payer: Medicaid Other | Admitting: Family Medicine

## 2017-08-31 VITALS — BP 122/82 | HR 99 | Resp 16 | Ht 65.0 in | Wt 252.0 lb

## 2017-08-31 DIAGNOSIS — I10 Essential (primary) hypertension: Secondary | ICD-10-CM

## 2017-08-31 DIAGNOSIS — R569 Unspecified convulsions: Secondary | ICD-10-CM | POA: Diagnosis not present

## 2017-08-31 DIAGNOSIS — F32A Depression, unspecified: Secondary | ICD-10-CM

## 2017-08-31 DIAGNOSIS — F419 Anxiety disorder, unspecified: Secondary | ICD-10-CM | POA: Diagnosis not present

## 2017-08-31 DIAGNOSIS — F329 Major depressive disorder, single episode, unspecified: Secondary | ICD-10-CM

## 2017-08-31 DIAGNOSIS — M25561 Pain in right knee: Secondary | ICD-10-CM

## 2017-08-31 DIAGNOSIS — M25569 Pain in unspecified knee: Secondary | ICD-10-CM | POA: Insufficient documentation

## 2017-08-31 MED ORDER — METHYLPREDNISOLONE ACETATE 80 MG/ML IJ SUSP
80.0000 mg | Freq: Once | INTRAMUSCULAR | Status: AC
Start: 2017-08-31 — End: 2017-08-31
  Administered 2017-08-31: 80 mg via INTRAMUSCULAR

## 2017-08-31 MED ORDER — KETOROLAC TROMETHAMINE 60 MG/2ML IM SOLN
60.0000 mg | Freq: Once | INTRAMUSCULAR | Status: AC
  Administered 2017-08-31: 60 mg via INTRAMUSCULAR

## 2017-08-31 MED ORDER — PREDNISONE 5 MG PO TABS: ORAL_TABLET | ORAL | 0 refills | Status: DC

## 2017-08-31 NOTE — Assessment & Plan Note (Signed)
2 day h/o right knee pain Uncontrolled.Toradol and depo medrol administered IM in the office , to be followed by a short course of oral prednisone

## 2017-08-31 NOTE — Patient Instructions (Addendum)
F/U in 4 months, call if you needme before  Toradol 60 mg and depo medrol 80 mg Im today for right knee pain and 5 days of prednisone is prescribed  Blood pressure is very good  Please work on weight loss , need this so that your knee does not hurt as much and you do not get arthritis.   Please drink FOUR 16 ounce bottles of water evwery day.  Need non fasting chem7 and EGFr  2nd week in August  ( [facility])   Please STOP POTATO CHIPS kidney function and wight will get better  Please no more seizures!

## 2017-09-03 ENCOUNTER — Ambulatory Visit: Payer: Medicaid Other | Admitting: "Endocrinology

## 2017-09-04 ENCOUNTER — Encounter: Payer: Self-pay | Admitting: "Endocrinology

## 2017-09-05 ENCOUNTER — Encounter: Payer: Self-pay | Admitting: Family Medicine

## 2017-09-05 NOTE — Assessment & Plan Note (Signed)
Uncontrolled currently as reports recent seizure ,mild swelling of left upper lip , no laceration, importance of talking medication on schedule is strressed

## 2017-09-05 NOTE — Assessment & Plan Note (Signed)
Controlled, no change in medication DASH diet and commitment to daily physical activity for a minimum of 30 minutes discussed and encouraged, as a part of hypertension management. The importance of attaining a healthy weight is also discussed.  BP/Weight 08/31/2017 07/03/2017 05/28/2017 05/20/2017 04/28/2017 04/10/2017 4/94/9447  Systolic BP 395 844 171 278 718 367 255  Diastolic BP 82 77 76 75 82 76 65  Wt. (Lbs) 252 249 249 250 251 249.04 250  BMI 41.93 41.44 41.44 41.6 41.77 41.44 41.6

## 2017-09-05 NOTE — Progress Notes (Signed)
[name]     MRN: [id number]      DOB: Feb 16, 1956   HPI [name] is here for follow up and re-evaluation of chronic medical conditions, medication management and review of any available recent lab and radiology data.  Preventive health is updated, specifically  Cancer screening and Immunization.   The PT denies any adverse reactions to current medications since the last visit.  2 day h/o increased and uncontrolled right knee pain. She has been moving this past weekend and feels this may have triggered her current pain, requests injections in the office today to help with the pain  States she had 1 switzer since her last visit , traumatized her upper lip, however only mild swelling, no laceration or bleeding Trying to eat smaller portions, no regular exercise, no weight loss yet, will continue  ROS Denies recent fever or chills. Denies sinus pressure, nasal congestion, ear pain or sore throat. Denies chest congestion, productive cough or wheezing. Denies chest pains, palpitations and leg swelling Denies abdominal pain, nausea, vomiting,diarrhea or constipation.   Denies dysuria, frequency, hesitancy or incontinence.  Denies headaches, , numbness, or tingling. Denies uncontrolled depression, anxiety or insomnia. Denies skin break down or rash.   PE  BP 122/82   Pulse 99   Resp 16   Ht 5\' 5"  (1.651 m)   Wt 252 lb (114.3 kg)   SpO2 98%   BMI 41.93 kg/m   Patient alert and oriented and in no cardiopulmonary distress.  HEENT: No facial asymmetry, EOMI,   oropharynx pink and moist.  Neck supple no JVD, no mass.  Chest: Clear to auscultation bilaterally.  CVS: S1, S2 no murmurs, no S3.Regular rate.  ABD: Soft non tender.   Ext: No edema  MS: Adequate ROM spine, shoulders, hips and reduced in right knee.  Skin: Intact, no ulcerations or rash noted.  Psych: Good eye contact, normal affect. Memory intact not anxious or depressed appearing.  CNS: CN 2-12 intact,  power,  normal throughout.no focal deficits noted.   Assessment & Plan   Knee pain, acute 2 day h/o right knee pain Uncontrolled.Toradol and depo medrol administered IM in the office , to be followed by a short course of oral prednisone    Essential hypertension Controlled, no change in medication DASH diet and commitment to daily physical activity for a minimum of 30 minutes discussed and encouraged, as a part of hypertension management. The importance of attaining a healthy weight is also discussed.  BP/Weight 08/31/2017 07/03/2017 05/28/2017 05/20/2017 04/28/2017 04/10/2017 3/50/0938  Systolic BP 182 993 716 967 893 810 175  Diastolic BP 82 77 76 75 82 76 65  Wt. (Lbs) 252 249 249 250 251 249.04 250  BMI 41.93 41.44 41.44 41.6 41.77 41.44 41.6       Morbid obesity (HCC) Deteriorated. Patient re-educated about  the importance of commitment to a  minimum of 150 minutes of exercise per week.  The importance of healthy food choices with portion control discussed. Encouraged to start a food diary, count calories and to consider  joining a support group. Sample diet sheets offered. Goals set by the patient for the next several months.   Weight /BMI 08/31/2017 07/03/2017 05/28/2017  WEIGHT 252 lb 249 lb 249 lb  HEIGHT 5\' 5"  5\' 5"  5\' 5"   BMI 41.93 kg/m2 41.44 kg/m2 41.44 kg/m2      Anxiety and depression Controlled, no change in medication   Convulsions Uncontrolled currently as reports recent seizure ,mild  swelling of left upper lip , no laceration, importance of talking medication on schedule is strressed

## 2017-09-05 NOTE — Assessment & Plan Note (Signed)
Controlled, no change in medication  

## 2017-09-05 NOTE — Assessment & Plan Note (Signed)
Deteriorated. Patient re-educated about  the importance of commitment to a  minimum of 150 minutes of exercise per week.  The importance of healthy food choices with portion control discussed. Encouraged to start a food diary, count calories and to consider  joining a support group. Sample diet sheets offered. Goals set by the patient for the next several months.   Weight /BMI 08/31/2017 07/03/2017 05/28/2017  WEIGHT 252 lb 249 lb 249 lb  HEIGHT 5\' 5"  5\' 5"  5\' 5"   BMI 41.93 kg/m2 41.44 kg/m2 41.44 kg/m2

## 2017-09-30 LAB — TSH: TSH: 1.4 mIU/L (ref 0.40–4.50)

## 2017-09-30 LAB — THYROGLOBULIN ANTIBODY: Thyroglobulin Ab: <1 IU/mL (ref <=1)

## 2017-09-30 LAB — T4, FREE: Free T4: 1.2 ng/dL (ref 0.8–1.8)

## 2017-09-30 LAB — THYROGLOBULIN LEVEL: Thyroglobulin: 0.6 ng/mL — ABNORMAL LOW

## 2017-10-06 ENCOUNTER — Ambulatory Visit (INDEPENDENT_AMBULATORY_CARE_PROVIDER_SITE_OTHER): Payer: Medicaid Other | Admitting: "Endocrinology

## 2017-10-06 ENCOUNTER — Encounter: Payer: Self-pay | Admitting: "Endocrinology

## 2017-10-06 VITALS — BP 125/70 | HR 71 | Ht 65.0 in | Wt 250.0 lb

## 2017-10-06 DIAGNOSIS — C73 Malignant neoplasm of thyroid gland: Secondary | ICD-10-CM

## 2017-10-06 DIAGNOSIS — E890 Postprocedural hypothyroidism: Secondary | ICD-10-CM | POA: Diagnosis not present

## 2017-10-06 MED ORDER — LEVOTHYROXINE SODIUM 137 MCG PO TABS: ORAL_TABLET | ORAL | 6 refills | Status: DC

## 2017-10-06 NOTE — Progress Notes (Signed)
Endocrinology follow-up note   Subjective:    Patient ID: [name], female    DOB: [birth year],    Past Medical History:  Diagnosis Date  . Depression   . Fractures   . Hyperlipidemia   . Hypertension   . Mental retardation, mild (I.Q. 50-70)   . Obesity   . Seizure ([place])    11/17/2012 "still having them; "I believe their from a tumor in my head" ((11/17/2012  . Seizure disorder ([place])   . Thyroid cancer [facility])    Past Surgical History:  Procedure Laterality Date  . ABDOMINAL HYSTERECTOMY  2001  . BREAST LUMPECTOMY  1996   right   . COLONOSCOPY N/A 05/16/2016   Procedure: COLONOSCOPY;  Surgeon: Danie Binder, MD;  Location: [facility];  Service: Endoscopy;  Laterality: N/A;  830   . THYROIDECTOMY Bilateral 11/17/2012   Procedure: TOTAL THYROIDECTOMY;  Surgeon: Ascencion Dike, MD;  Location: [facility];  Service: ENT;  Laterality: Bilateral;  . TOTAL THYROIDECTOMY Bilateral 11/17/2012  . TUBAL LIGATION     Social History   Socioeconomic History  . Marital status: Single    Spouse name: Not on file  . Number of children: 2  . Years of education: Not on file  . Highest education level: Not on file  Occupational History  . Occupation: disabled   Social Needs  . Financial resource strain: Not on file  . Food insecurity:    Worry: Not on file    Inability: Not on file  . Transportation needs:    Medical: Not on file    Non-medical: Not on file  Tobacco Use  . Smoking status: Never Smoker  . Smokeless tobacco: Never Used  Substance and Sexual Activity  . Alcohol use: No  . Drug use: No  . Sexual activity: Never  Lifestyle  . Physical activity:    Days per week: Not on file    Minutes per session: Not on file  . Stress: Not on file  Relationships  . Social connections:    Talks on phone: Not on file    Gets together: Not on file    Attends religious service: Not on file    Active member of club or organization: Not on file    Attends meetings of clubs or  organizations: Not on file    Relationship status: Not on file  Other Topics Concern  . Not on file  Social History Narrative  . Not on file   Outpatient Encounter Medications as of 10/06/2017  Medication Sig  . acetaminophen (TYLENOL) 500 MG tablet Take 1 tablet (500 mg total) by mouth 2 (two) times daily.  . benazepril (LOTENSIN) 40 MG tablet TAKE (1) TABLET BY MOUTH DAILY.  . calcitRIOL (ROCALTROL) 0.25 MCG capsule Take 0.25 mcg by mouth every other day. Monday Wednesday and Friday  . ergocalciferol (VITAMIN D2) 50000 units capsule Take 1 capsule (50,000 Units total) by mouth once a week. One capsule once weekly  . FLUoxetine (PROZAC) 10 MG capsule TAKE (1) CAPSULE BY MOUTH ONCE DAILY.  . hydrochlorothiazide (HYDRODIURIL) 25 MG tablet Take 1 tablet (25 mg total) by mouth daily.  Marland Kitchen lamoTRIgine (LAMICTAL) 100 MG tablet Take 1 tablet (100 mg total) by mouth daily.  Marland Kitchen levETIRAcetam (KEPPRA) 750 MG tablet Two tablets twice daily  . levothyroxine (SYNTHROID, LEVOTHROID) 137 MCG tablet TAKE 1 TABLET BY MOUTH DAILY BEFORE BREAKFAST.  Marland Kitchen loratadine (CLARITIN) 10 MG tablet Take 1 tablet (10 mg total)  by mouth daily.  Marland Kitchen omeprazole (PRILOSEC) 20 MG capsule Take 2 capsules (40 mg total) by mouth daily.  . predniSONE (DELTASONE) 5 MG tablet One tablet two times daily for 5 days  . rosuvastatin (CRESTOR) 40 MG tablet Take 1 tablet (40 mg total) by mouth daily.  . [DISCONTINUED] levothyroxine (SYNTHROID, LEVOTHROID) 125 MCG tablet TAKE 1 TABLET BY MOUTH DAILY BEFORE BREAKFAST.   No facility-administered encounter medications on file as of 10/06/2017.    ALLERGIES: Allergies  Allergen Reactions  . Simvastatin Other (See Comments)    Muscle aches   VACCINATION STATUS: Immunization History  Administered Date(s) Administered  . H1N1 12/16/2007  . Influenza Split 11/24/2011  . Influenza Whole 11/30/2006, 12/07/2008, 12/26/2009  . Influenza,inj,Quad PF,6+ Mos 11/18/2012, 12/06/2013, 05/23/2015,  01/29/2016, 12/01/2016  . Pneumococcal Conjugate-13 04/03/2014  . Td 08/22/2003  . Tdap 04/02/2016    HPI  62 yr old female with medical hx as above . She is here to follow-up for post surgical hypothyroidism and for follow-up of Papillary  thyroid cancer. 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 She has had  Thyrogen stimulated remnant ablation with negative post therapy WBS in 2014.    She is now on Synthroid 125 g by mouth every morning.  - An attempt to biopsy the left cervical 0.9 cm lymph node was abandoned because " no evidence of adenopathy".  - Second Thyrogen Stimulated whole body scan for surveillance shows no scintigraphic evidence of iodine avid metastasis papillary thyroid cancer on 04/18/2016. -She was supposed to have surveillance thyroid/neck ultrasound, not been due to various reasons including Medicaid noncoverage for the study as well as patient noncompliance. -She is compliant  to her thyroid hormone ,denies any new complaints.  -Her most recent surveillance thyroid/neck ultrasound on June 01, 2017, was reported to be unremarkable. she denies palpitations. she denies dysphagia, SOB, nor voice change. she denies family history of thyroid cancer.  Review of Systems  Constitutional: Negative for fatigue.  HENT: Negative for trouble swallowing and voice change.   Eyes: Negative for visual disturbance.  Respiratory: Negative for cough, shortness of breath and wheezing.   Cardiovascular: Negative for chest pain, palpitations and leg swelling.  Gastrointestinal: Negative for diarrhea, nausea and vomiting.  Endocrine: Negative for cold intolerance, heat intolerance, polydipsia, polyphagia and polyuria.  Musculoskeletal: Negative for arthralgias and myalgias.  Skin: Negative for color change, pallor, rash and wound.  Neurological: Negative for  seizures and headaches.  Psychiatric/Behavioral: Negative for confusion and suicidal ideas.    Objective:    BP 125/70   Pulse 71   Ht 5\' 5"  (1.651 m)   Wt 250 lb (113.4 kg)   BMI 41.60 kg/m   Wt Readings from Last 3 Encounters:  10/06/17 250 lb (113.4 kg)  08/31/17 252 lb (114.3 kg)  07/03/17 249 lb (112.9 kg)    Physical Exam  Constitutional: She is oriented to person, place, and time. She appears well-developed.  HENT:  Head: Normocephalic and atraumatic.  Eyes: EOM are normal.  Neck: Normal range of motion. Neck supple. No tracheal deviation present.  Healed scar from prior total thyroidectomy.  No gross mass lesions on physical exam.  Cardiovascular: Normal rate and regular rhythm.  Pulmonary/Chest: Effort normal.  Abdominal: There is no tenderness. There is no guarding.  Musculoskeletal: Normal range of motion. She exhibits no edema.  Neurological: She is alert and oriented to person,  place, and time. She has normal reflexes. No cranial nerve deficit. Coordination normal.  Skin: Skin is warm and dry. No rash noted. No erythema. No pallor.  Psychiatric: She has a normal mood and affect. Judgment normal.    Results for orders placed or performed in visit on 05/28/17  T4, free  Result Value Ref Range   Free T4 1.2 0.8 - 1.8 ng/dL  TSH  Result Value Ref Range   TSH 1.40 0.40 - 4.50 mIU/L  Thyroglobulin Level  Result Value Ref Range   Thyroglobulin 0.6 (L) ng/mL   Comment    Thyroglobulin antibody  Result Value Ref Range   Thyroglobulin Ab <1 < or = 1 IU/mL   Complete Blood Count (Most recent): Lab Results  Component Value Date   WBC 4.5 08/21/2017   HGB 10.9 (L) 08/21/2017   HCT 33.3 (L) 08/21/2017   MCV 83.7 08/21/2017   PLT 213 08/21/2017   Chemistry (most recent): Lab Results  Component Value Date   NA 141 08/21/2017   K 4.4 08/21/2017   CL 105 08/21/2017   CO2 26 08/21/2017   BUN 49 (H) 08/21/2017   CREATININE 2.37 (H) 08/21/2017   Diabetic  Labs (most recent): Lab Results  Component Value Date   HGBA1C 5.2 05/26/2017   HGBA1C 5.3 08/10/2012   Lipid Panel     Component Value Date/Time   CHOL 199 08/21/2017 1004   CHOL 231 (H) 06/18/2016 0954   TRIG 74 08/21/2017 1004   HDL 50 (L) 08/21/2017 1004   HDL 46 06/18/2016 0954   CHOLHDL 4.0 08/21/2017 1004   VLDL 14 01/29/2016 1109   LDLCALC 132 (H) 08/21/2017 1004   Results for NAIAH, DONAHOE (MRN [id number]) as of 10/06/2017 10:56  Ref. Range 09/29/2017 09:26  TSH Latest Ref Range: 0.40 - 4.50 mIU/L 1.40  T4,Free(Direct) Latest Ref Range: 0.8 - 1.8 ng/dL 1.2  Thyroglobulin Latest Units: ng/mL 0.6 (L)  Thyroglobulin Ab Latest Ref Range: < or = 1 IU/mL <1    June 01, 2017  IMPRESSION:  Questioned approximately 2.2 cm soft tissue within the right lobectomy resection bed appears similar to the 07/2015 examination.  Note, while nonspecific, this tissue was apparently present at the time of the Thyrogen stimulated I 131 nuclear medicine whole body scan performed 03/2016 which was negative for the presence of iodine avid metastatic thyroid cancer or residual disease, and thus favored to be benign etiology. Clinical correlation is advised.  Electronically Signed   By: Sandi Mariscal M.D.   On: 06/01/2017 11:38  Assessment & Plan:   1. Hypothyroidism, postsurgical -Her repeat thyroid function tests are consistent with appropriate replacement. -However, she would benefit from slight increase in her levothyroxine dose.  I discussed and increase her levothyroxine to 137 mcg p.o. nightly.     - We discussed about correct intake of levothyroxine, at fasting, with water, separated by at least 30 minutes from breakfast, and separated by more than 4 hours from calcium, iron, multivitamins, acid reflux medications (PPIs). -Patient is made aware of the fact that thyroid hormone replacement is needed for life, dose to be adjusted by periodic monitoring of thyroid function  tests.   - Target TSH for her would be between 0.1-0.5.  2. Papillary thyroid carcinoma ([place]) Her  surveillance neck /thyroid u/s from 11/17/2013 was c/w surgically absent thyroid, however there is a 0.9 cm left cervical lymph node. See below.  Pt is s/p thyrogen stimulated thyroid remnant ablation with WBS on  01/14/2013 with no evidence of iodine avid metastasis. She has had pT2,Nx,Mx FVPTC, s/p near total thyroidectomy on September 24,2014. She is counseled to maintain high degree of compliance with synthroid therapy with aim of keeping TSH low normal ( 0.1-0.5).  -she is advised on the necessity of follow-ups and imaging studies at least one annually for the next 5 years. - She was sent for core biopsy of 0.9 cm left cervical lymph node last visit. However, this procedure was abandoned due to " no evidence of adenopathy".  The previously noticed  left cervical lymph node was sent to decrease in size to 0.5 cm. Medicaid did not cover surveillance ultrasound. - Second Thyrogen estimated or body scan for surveillance shows no scintigraphic evidence of iodine avid metastasis papillary thyroid cancer on 04/18/2016. -June 01, 2017 surveillance thyroid/neck ultrasound : Unremarkable, see above.   -She will be considered for Thyrogen stimulated whole body scan in 1 year.  She is advised to keep close follow-up with Dr. Tula Nakayama for primary care needs.  Follow up plan: Return in about 6 months (around 04/08/2018) for Follow up with Pre-visit Labs.  Glade Lloyd, MD Phone: [phone]  Fax: [phone]  -  This note was partially dictated with voice recognition software. Similar sounding words can be transcribed inadequately or may not  be corrected upon review.   10/06/2017, 1:29 PM

## 2017-10-23 ENCOUNTER — Other Ambulatory Visit: Payer: Self-pay | Admitting: Family Medicine

## 2017-10-23 DIAGNOSIS — F320 Major depressive disorder, single episode, mild: Secondary | ICD-10-CM

## 2017-10-23 DIAGNOSIS — I10 Essential (primary) hypertension: Secondary | ICD-10-CM

## 2017-10-26 ENCOUNTER — Other Ambulatory Visit: Payer: Self-pay | Admitting: Family Medicine

## 2017-10-30 DIAGNOSIS — R569 Unspecified convulsions: Secondary | ICD-10-CM | POA: Diagnosis not present

## 2017-10-30 DIAGNOSIS — F78 Other intellectual disabilities: Secondary | ICD-10-CM | POA: Diagnosis not present

## 2017-10-30 DIAGNOSIS — I10 Essential (primary) hypertension: Secondary | ICD-10-CM | POA: Diagnosis not present

## 2018-01-04 ENCOUNTER — Ambulatory Visit: Payer: Medicaid Other | Admitting: Family Medicine

## 2018-01-18 ENCOUNTER — Encounter: Payer: Self-pay | Admitting: Family Medicine

## 2018-01-18 ENCOUNTER — Ambulatory Visit (INDEPENDENT_AMBULATORY_CARE_PROVIDER_SITE_OTHER): Payer: Medicaid Other | Admitting: Family Medicine

## 2018-01-18 VITALS — BP 128/84 | HR 100 | Resp 15 | Ht 65.0 in | Wt 250.0 lb

## 2018-01-18 DIAGNOSIS — I10 Essential (primary) hypertension: Secondary | ICD-10-CM | POA: Diagnosis not present

## 2018-01-18 DIAGNOSIS — E785 Hyperlipidemia, unspecified: Secondary | ICD-10-CM

## 2018-01-18 DIAGNOSIS — Z23 Encounter for immunization: Secondary | ICD-10-CM

## 2018-01-18 DIAGNOSIS — E890 Postprocedural hypothyroidism: Secondary | ICD-10-CM

## 2018-01-18 NOTE — Assessment & Plan Note (Signed)
Deteriorated. Patient re-educated about  the importance of commitment to a  minimum of 150 minutes of exercise per week.  The importance of healthy food choices with portion control discussed. Encouraged to start a food diary, count calories and to consider  joining a support group. Sample diet sheets offered. Goals set by the patient for the next several months.   Weight /BMI 01/18/2018 10/06/2017 08/31/2017  WEIGHT 250 lb 250 lb 252 lb  HEIGHT 5\' 5"  5\' 5"  5\' 5"   BMI 41.6 kg/m2 41.6 kg/m2 41.93 kg/m2

## 2018-01-18 NOTE — Assessment & Plan Note (Signed)
Controlled, no change in medication DASH diet and commitment to daily physical activity for a minimum of 30 minutes discussed and encouraged, as a part of hypertension management. The importance of attaining a healthy weight is also discussed.  BP/Weight 01/18/2018 10/06/2017 08/31/2017 07/03/2017 05/28/2017 10/29/7531 10/26/7919  Systolic BP 783 754 237 023 017 209 106  Diastolic BP 84 70 82 77 76 75 82  Wt. (Lbs) 250 250 252 249 249 250 251  BMI 41.6 41.6 41.93 41.44 41.44 41.6 41.77

## 2018-01-18 NOTE — Assessment & Plan Note (Signed)
Managed by endo and controlled 

## 2018-01-18 NOTE — Patient Instructions (Addendum)
Physical with pap in 5  months, call if you need me before  Flu vaccine today.  Blood pressure is good, no changes in medication  Lipid,cmp and EGFR today   Increase vegetables, reduce potatoes , and work on healthier weight  Walk/ exercise every day for at least 20 mins  Thank you  for choosing [facility]. We consider it a privelige to serve you.  Delivering excellent health care in a caring and  compassionate way is our goal.  Partnering with you,  so that together we can achieve this goal is our strategy.

## 2018-01-18 NOTE — Progress Notes (Signed)
   [name]     MRN: [id number]      DOB: 1955-11-09   HPI [name] is here for follow up and re-evaluation of chronic medical conditions, medication management and review of any available recent lab and radiology data.  Preventive health is updated, specifically  Cancer screening and Immunization.   Questions or concerns regarding consultations or procedures which the PT has had in the interim are  addressed. The PT denies any adverse reactions to current medications since the last visit.  There are no new concerns.  There are no specific complaints   ROS Denies recent fever or chills. Denies sinus pressure, nasal congestion, ear pain or sore throat. Denies chest congestion, productive cough or wheezing. Denies chest pains, palpitations and leg swelling Denies abdominal pain, nausea, vomiting,diarrhea or constipation.   Denies dysuria, frequency, hesitancy or incontinence. Denies joint pain, swelling and limitation in mobility. Denies headaches, seizures, numbness, or tingling. Denies depression, anxiety or insomnia. Denies skin break down or rash.   PE  BP 128/84   Pulse 100   Resp 15   Ht 5\' 5"  (1.651 m)   Wt 250 lb (113.4 kg)   SpO2 97%   BMI 41.60 kg/m   Patient alert and oriented and in no cardiopulmonary distress.  HEENT: No facial asymmetry, EOMI,   oropharynx pink and moist.  Neck supple no JVD, no mass.  Chest: Clear to auscultation bilaterally.  CVS: S1, S2 no murmurs, no S3.Regular rate.  ABD: Soft non tender.   Ext: No edema  MS: Adequate ROM spine, shoulders, hips and knees.  Skin: Intact, no ulcerations or rash noted.  Psych: Good eye contact, normal affect. Memory intact not anxious or depressed appearing.  CNS: CN 2-12 intact, power,  normal throughout.no focal deficits noted.   Assessment & Plan  Essential hypertension Controlled, no change in medication DASH diet and commitment to daily physical activity for a minimum of 30  minutes discussed and encouraged, as a part of hypertension management. The importance of attaining a healthy weight is also discussed.  BP/Weight 01/18/2018 10/06/2017 08/31/2017 07/03/2017 05/28/2017 06/02/8117 02/28/7827  Systolic BP 562 130 865 784 696 295 284  Diastolic BP 84 70 82 77 76 75 82  Wt. (Lbs) 250 250 252 249 249 250 251  BMI 41.6 41.6 41.93 41.44 41.44 41.6 41.77       Morbid obesity ([facility]) Deteriorated. Patient re-educated about  the importance of commitment to a  minimum of 150 minutes of exercise per week.  The importance of healthy food choices with portion control discussed. Encouraged to start a food diary, count calories and to consider  joining a support group. Sample diet sheets offered. Goals set by the patient for the next several months.   Weight /BMI 01/18/2018 10/06/2017 08/31/2017  WEIGHT 250 lb 250 lb 252 lb  HEIGHT 5\' 5"  5\' 5"  5\' 5"   BMI 41.6 kg/m2 41.6 kg/m2 41.93 kg/m2      Hyperlipidemia LDL goal <100 Hyperlipidemia:Low fat diet discussed and encouraged.   Lipid Panel  Lab Results  Component Value Date   CHOL 199 08/21/2017   HDL 50 (L) 08/21/2017   LDLCALC 132 (H) 08/21/2017   TRIG 74 08/21/2017   CHOLHDL 4.0 08/21/2017  not at goal Updated lab needed at/ before next visit.      Hypothyroidism, postsurgical Managed by endo and controlled

## 2018-01-18 NOTE — Assessment & Plan Note (Signed)
Hyperlipidemia:Low fat diet discussed and encouraged.   Lipid Panel  Lab Results  Component Value Date   CHOL 199 08/21/2017   HDL 50 (L) 08/21/2017   LDLCALC 132 (H) 08/21/2017   TRIG 74 08/21/2017   CHOLHDL 4.0 08/21/2017  not at goal Updated lab needed at/ before next visit.

## 2018-01-19 LAB — COMPLETE METABOLIC PANEL WITH GFR
AG Ratio: 1.1 (calc) (ref 1.0–2.5)
ALT: 10 U/L (ref 6–29)
AST: 17 U/L (ref 10–35)
Albumin: 4.3 g/dL (ref 3.6–5.1)
Alkaline phosphatase (APISO): 133 U/L — ABNORMAL HIGH (ref 33–130)
BUN/Creatinine Ratio: 23 (calc) — ABNORMAL HIGH (ref 6–22)
BUN: 47 mg/dL — ABNORMAL HIGH (ref 7–25)
CO2: 23 mmol/L (ref 20–32)
Calcium: 10 mg/dL (ref 8.6–10.4)
Chloride: 103 mmol/L (ref 98–110)
Creat: 2.06 mg/dL — ABNORMAL HIGH (ref 0.50–0.99)
GFR, Est African American: 29 mL/min/{1.73_m2} — ABNORMAL LOW (ref >=60)
GFR, Est Non African American: 25 mL/min/{1.73_m2} — ABNORMAL LOW (ref >=60)
Globulin: 3.9 g/dL (calc) — ABNORMAL HIGH (ref 1.9–3.7)
Glucose, Bld: 92 mg/dL (ref 65–99)
Potassium: 4.3 mmol/L (ref 3.5–5.3)
Sodium: 139 mmol/L (ref 135–146)
Total Bilirubin: 0.5 mg/dL (ref 0.2–1.2)
Total Protein: 8.2 g/dL — ABNORMAL HIGH (ref 6.1–8.1)

## 2018-01-19 LAB — LIPID PANEL
Cholesterol: 196 mg/dL (ref <200)
HDL: 53 mg/dL (ref >50)
LDL Cholesterol (Calc): 125 mg/dL (calc) — ABNORMAL HIGH
Non-HDL Cholesterol (Calc): 143 mg/dL (calc) — ABNORMAL HIGH (ref <130)
Total CHOL/HDL Ratio: 3.7 (calc) (ref <5.0)
Triglycerides: 84 mg/dL (ref <150)

## 2018-02-11 DIAGNOSIS — I10 Essential (primary) hypertension: Secondary | ICD-10-CM | POA: Diagnosis not present

## 2018-02-11 DIAGNOSIS — F78 Other intellectual disabilities: Secondary | ICD-10-CM | POA: Diagnosis not present

## 2018-02-11 DIAGNOSIS — R569 Unspecified convulsions: Secondary | ICD-10-CM | POA: Diagnosis not present

## 2018-03-10 ENCOUNTER — Other Ambulatory Visit (HOSPITAL_COMMUNITY)
Admission: AD | Admit: 2018-03-10 | Discharge: 2018-03-10 | Disposition: A | Discharge status: Home / Self Care | Payer: Medicaid Other | Source: Skilled Nursing Facility | Attending: Family Medicine | Admitting: Family Medicine

## 2018-03-10 ENCOUNTER — Telehealth: Payer: Self-pay

## 2018-03-10 DIAGNOSIS — N3000 Acute cystitis without hematuria: Secondary | ICD-10-CM | POA: Insufficient documentation

## 2018-03-10 LAB — URINALYSIS, ROUTINE W REFLEX MICROSCOPIC
Bacteria, UA: NONE SEEN
Bilirubin Urine: NEGATIVE
Glucose, UA: NEGATIVE mg/dL
Ketones, ur: NEGATIVE mg/dL
Nitrite: NEGATIVE
Protein, ur: 30 mg/dL — AB
Specific Gravity, Urine: 1.019 (ref 1.005–1.030)
pH: 5 (ref 5.0–8.0)

## 2018-03-10 NOTE — Telephone Encounter (Signed)
pls order cCUA and reflex c/s , to[facility] asap, will send med once this is done

## 2018-03-10 NOTE — Telephone Encounter (Signed)
Merlene Laughter, RN sent to [facility], LPN        Good morning Nicholai Willette.   I saw Angellica yesterday and she c/o burning with urination since the first week of January. She called the office yesterday and scheduled an appointment with Dr. Moshe Cipro for 03/17/18. I felt that is way too long for her to be in pain from a possible UTI. All her vitals were normal and she didn't c/o back, abdomen pain, n/v, chills or fever. She has transportation issues and has to arrange pick up from [facility], so her getting to the office would be very slim. Can you see if Dr. Moshe Cipro would give me an order to go out and collect a urine specimen on her and give her something for the pain please?

## 2018-03-10 NOTE — Telephone Encounter (Signed)
Order faxed to [place] per her message

## 2018-03-11 ENCOUNTER — Other Ambulatory Visit: Payer: Self-pay | Admitting: Family Medicine

## 2018-03-11 LAB — URINE CULTURE

## 2018-03-11 MED ORDER — CIPROFLOXACIN HCL 500 MG PO TABS: 500.0000 mg | ORAL_TABLET | Freq: Two times a day (BID) | ORAL | 0 refills | Status: DC

## 2018-03-11 NOTE — Progress Notes (Signed)
cipro

## 2018-03-17 ENCOUNTER — Encounter: Payer: Self-pay | Admitting: Family Medicine

## 2018-03-17 ENCOUNTER — Ambulatory Visit (INDEPENDENT_AMBULATORY_CARE_PROVIDER_SITE_OTHER): Payer: Medicaid Other | Admitting: Family Medicine

## 2018-03-17 VITALS — BP 124/72 | HR 92 | Resp 15 | Ht 65.0 in | Wt 249.0 lb

## 2018-03-17 DIAGNOSIS — F329 Major depressive disorder, single episode, unspecified: Secondary | ICD-10-CM

## 2018-03-17 DIAGNOSIS — F419 Anxiety disorder, unspecified: Secondary | ICD-10-CM | POA: Diagnosis not present

## 2018-03-17 DIAGNOSIS — I10 Essential (primary) hypertension: Secondary | ICD-10-CM | POA: Diagnosis not present

## 2018-03-17 DIAGNOSIS — R569 Unspecified convulsions: Secondary | ICD-10-CM

## 2018-03-17 DIAGNOSIS — E785 Hyperlipidemia, unspecified: Secondary | ICD-10-CM

## 2018-03-17 DIAGNOSIS — F32A Depression, unspecified: Secondary | ICD-10-CM

## 2018-03-17 DIAGNOSIS — F320 Major depressive disorder, single episode, mild: Secondary | ICD-10-CM

## 2018-03-17 DIAGNOSIS — E890 Postprocedural hypothyroidism: Secondary | ICD-10-CM

## 2018-03-17 NOTE — Patient Instructions (Addendum)
Pt to [facility] April appt, pls change to f/U with pap  Please notify pharmacy that  Fluoxetine is discontinued  Needs fasting lipid, cmp and and EGFr, CBC 1 week before April visit  Continue to work on losing weight , and start regular exercise  Thank you  for choosing [facility]. We consider it a privelige to serve you.  Delivering excellent health care in a caring and  compassionate way is our goal.  Partnering with you,  so that together we can achieve this goal is our strategy.

## 2018-03-17 NOTE — Progress Notes (Signed)
   [name]     MRN: [id number]      DOB: November 08, 1955   HPI [name] is here for follow up and re-evaluation of chronic medical conditions, medication management and review of any available recent lab and radiology data.  Preventive health is updated, specifically  Cancer screening and Immunization.   States she feels much better now that she has taken medication for UTI   ROS Denies recent fever or chills. Denies sinus pressure, nasal congestion, ear pain or sore throat. Denies chest congestion, productive cough or wheezing. Denies chest pains, palpitations and leg swelling Denies abdominal pain, nausea, vomiting,diarrhea or constipation.   Denies dysuria, frequency, hesitancy or incontinence. Denies joint pain, swelling and limitation in mobility. Denies headaches, seizures, numbness, or tingling. Denies depression, anxiety or insomnia. Denies skin break down or rash.   PE  BP 124/72   Pulse 92   Resp 15   Ht 5\' 5"  (1.651 m)   Wt 249 lb (112.9 kg)   SpO2 96%   BMI 41.44 kg/m   Patient alert and oriented and in no cardiopulmonary distress.  HEENT: No facial asymmetry, EOMI,   oropharynx pink and moist.  Neck supple no JVD, no mass.  Chest: Clear to auscultation bilaterally.  CVS: S1, S2 no murmurs, no S3.Regular rate.  ABD: Soft non tender.   Ext: No edema  MS: Adequate ROM spine, shoulders, hips and knees.  Skin: Intact, no ulcerations or rash noted.  Psych: Good eye contact, normal affect. Memory intact not anxious or depressed appearing.  CNS: CN 2-12 intact, power,  normal throughout.no focal deficits noted.   Assessment & Plan  Essential hypertension Controlled, no change in medication DASH diet and commitment to daily physical activity for a minimum of 30 minutes discussed and encouraged, as a part of hypertension management. The importance of attaining a healthy weight is also discussed.  BP/Weight 03/17/2018 01/18/2018 10/06/2017 08/31/2017  07/03/2017 05/28/2017 8/46/6599  Systolic BP 357 017 793 903 009 233 007  Diastolic BP 72 84 70 82 77 76 75  Wt. (Lbs) 249 250 250 252 249 249 250  BMI 41.44 41.6 41.6 41.93 41.44 41.44 41.6       Hyperlipidemia LDL goal <100 Hyperlipidemia:Low fat diet discussed and encouraged.   Lipid Panel  Lab Results  Component Value Date   CHOL 196 01/18/2018   HDL 53 01/18/2018   LDLCALC 125 (H) 01/18/2018   TRIG 84 01/18/2018   CHOLHDL 3.7 01/18/2018   not at goal, needs to reduce fatty food intake    Morbid obesity ([place]) Unchanged Patient re-educated about  the importance of commitment to a  minimum of 150 minutes of exercise per week.  The importance of healthy food choices with portion control discussed. Encouraged to start a food diary, count calories and to consider  joining a support group. Sample diet sheets offered. Goals set by the patient for the next several months.   Weight /BMI 03/17/2018 01/18/2018 10/06/2017  WEIGHT 249 lb 250 lb 250 lb  HEIGHT 5\' 5"  5\' 5"  5\' 5"   BMI 41.44 kg/m2 41.6 kg/m2 41.6 kg/m2      Hypothyroidism, postsurgical Managed by endo and controlled  Anxiety and depression Controlled, no change in medication

## 2018-03-21 DIAGNOSIS — F320 Major depressive disorder, single episode, mild: Secondary | ICD-10-CM | POA: Insufficient documentation

## 2018-03-21 NOTE — Assessment & Plan Note (Signed)
Managed by endo and controlled 

## 2018-03-21 NOTE — Assessment & Plan Note (Signed)
Hyperlipidemia:Low fat diet discussed and encouraged.   Lipid Panel  Lab Results  Component Value Date   CHOL 196 01/18/2018   HDL 53 01/18/2018   LDLCALC 125 (H) 01/18/2018   TRIG 84 01/18/2018   CHOLHDL 3.7 01/18/2018   not at goal, needs to reduce fatty food intake

## 2018-03-21 NOTE — Assessment & Plan Note (Signed)
Controlled, no change in medication DASH diet and commitment to daily physical activity for a minimum of 30 minutes discussed and encouraged, as a part of hypertension management. The importance of attaining a healthy weight is also discussed.  BP/Weight 03/17/2018 01/18/2018 10/06/2017 08/31/2017 07/03/2017 05/28/2017 4/79/9872  Systolic BP 158 727 618 485 927 639 432  Diastolic BP 72 84 70 82 77 76 75  Wt. (Lbs) 249 250 250 252 249 249 250  BMI 41.44 41.6 41.6 41.93 41.44 41.44 41.6

## 2018-03-21 NOTE — Assessment & Plan Note (Signed)
Unchanged Patient re-educated about  the importance of commitment to a  minimum of 150 minutes of exercise per week.  The importance of healthy food choices with portion control discussed. Encouraged to start a food diary, count calories and to consider  joining a support group. Sample diet sheets offered. Goals set by the patient for the next several months.   Weight /BMI 03/17/2018 01/18/2018 10/06/2017  WEIGHT 249 lb 250 lb 250 lb  HEIGHT 5\' 5"  5\' 5"  5\' 5"   BMI 41.44 kg/m2 41.6 kg/m2 41.6 kg/m2

## 2018-03-21 NOTE — Assessment & Plan Note (Signed)
Controlled, no change in medication  

## 2018-04-01 LAB — T4, FREE: Free T4: 1.7 ng/dL (ref 0.8–1.8)

## 2018-04-01 LAB — TSH: TSH: 0.02 mIU/L — ABNORMAL LOW (ref 0.40–4.50)

## 2018-04-01 LAB — THYROGLOBULIN ANTIBODY: Thyroglobulin Ab: <1 IU/mL (ref <=1)

## 2018-04-01 LAB — THYROGLOBULIN LEVEL: Thyroglobulin: 0.2 ng/mL — ABNORMAL LOW

## 2018-04-02 ENCOUNTER — Other Ambulatory Visit: Payer: Self-pay

## 2018-04-02 ENCOUNTER — Inpatient Hospital Stay (HOSPITAL_COMMUNITY)
Admission: EM | Admit: 2018-04-02 | Discharge: 2018-04-06 | DRG: 872 | Disposition: A | Discharge status: Home / Self Care | Payer: Medicaid Other | Attending: Internal Medicine | Admitting: Internal Medicine

## 2018-04-02 ENCOUNTER — Encounter (HOSPITAL_COMMUNITY): Payer: Self-pay | Admitting: Emergency Medicine

## 2018-04-02 ENCOUNTER — Emergency Department (HOSPITAL_COMMUNITY): Payer: Medicaid Other

## 2018-04-02 DIAGNOSIS — F70 Mild intellectual disabilities: Secondary | ICD-10-CM | POA: Diagnosis present

## 2018-04-02 DIAGNOSIS — Z6841 Body Mass Index (BMI) 40.0 and over, adult: Secondary | ICD-10-CM

## 2018-04-02 DIAGNOSIS — G40409 Other generalized epilepsy and epileptic syndromes, not intractable, without status epilepticus: Secondary | ICD-10-CM | POA: Diagnosis present

## 2018-04-02 DIAGNOSIS — N183 Chronic kidney disease, stage 3 unspecified: Secondary | ICD-10-CM

## 2018-04-02 DIAGNOSIS — Z833 Family history of diabetes mellitus: Secondary | ICD-10-CM

## 2018-04-02 DIAGNOSIS — E876 Hypokalemia: Secondary | ICD-10-CM | POA: Diagnosis present

## 2018-04-02 DIAGNOSIS — N179 Acute kidney failure, unspecified: Secondary | ICD-10-CM

## 2018-04-02 DIAGNOSIS — Z79899 Other long term (current) drug therapy: Secondary | ICD-10-CM

## 2018-04-02 DIAGNOSIS — I10 Essential (primary) hypertension: Secondary | ICD-10-CM

## 2018-04-02 DIAGNOSIS — E785 Hyperlipidemia, unspecified: Secondary | ICD-10-CM | POA: Diagnosis present

## 2018-04-02 DIAGNOSIS — R197 Diarrhea, unspecified: Secondary | ICD-10-CM | POA: Diagnosis present

## 2018-04-02 DIAGNOSIS — E890 Postprocedural hypothyroidism: Secondary | ICD-10-CM | POA: Diagnosis present

## 2018-04-02 DIAGNOSIS — Z79890 Hormone replacement therapy (postmenopausal): Secondary | ICD-10-CM

## 2018-04-02 DIAGNOSIS — A419 Sepsis, unspecified organism: Principal | ICD-10-CM | POA: Diagnosis present

## 2018-04-02 DIAGNOSIS — Z85850 Personal history of malignant neoplasm of thyroid: Secondary | ICD-10-CM

## 2018-04-02 DIAGNOSIS — I129 Hypertensive chronic kidney disease with stage 1 through stage 4 chronic kidney disease, or unspecified chronic kidney disease: Secondary | ICD-10-CM | POA: Diagnosis present

## 2018-04-02 DIAGNOSIS — R569 Unspecified convulsions: Secondary | ICD-10-CM

## 2018-04-02 DIAGNOSIS — J101 Influenza due to other identified influenza virus with other respiratory manifestations: Secondary | ICD-10-CM | POA: Diagnosis present

## 2018-04-02 DIAGNOSIS — Z90710 Acquired absence of both cervix and uterus: Secondary | ICD-10-CM

## 2018-04-02 DIAGNOSIS — Z888 Allergy status to other drugs, medicaments and biological substances status: Secondary | ICD-10-CM

## 2018-04-02 DIAGNOSIS — G40909 Epilepsy, unspecified, not intractable, without status epilepticus: Secondary | ICD-10-CM

## 2018-04-02 DIAGNOSIS — F329 Major depressive disorder, single episode, unspecified: Secondary | ICD-10-CM | POA: Diagnosis present

## 2018-04-02 DIAGNOSIS — K219 Gastro-esophageal reflux disease without esophagitis: Secondary | ICD-10-CM | POA: Diagnosis present

## 2018-04-02 DIAGNOSIS — G629 Polyneuropathy, unspecified: Secondary | ICD-10-CM | POA: Diagnosis present

## 2018-04-02 LAB — CBC WITH DIFFERENTIAL/PLATELET
Abs Immature Granulocytes: 0.04 10*3/uL (ref 0.00–0.07)
Basophils Absolute: 0 10*3/uL (ref 0.0–0.1)
Basophils Relative: 1 %
Eosinophils Absolute: 0 10*3/uL (ref 0.0–0.5)
Eosinophils Relative: 0 %
HCT: 32.8 % — ABNORMAL LOW (ref 36.0–46.0)
Hemoglobin: 10.2 g/dL — ABNORMAL LOW (ref 12.0–15.0)
Immature Granulocytes: 1 %
Lymphocytes Relative: 7 %
Lymphs Abs: 0.5 10*3/uL — ABNORMAL LOW (ref 0.7–4.0)
MCH: 26.4 pg (ref 26.0–34.0)
MCHC: 31.1 g/dL (ref 30.0–36.0)
MCV: 85 fL (ref 80.0–100.0)
Monocytes Absolute: 0.9 10*3/uL (ref 0.1–1.0)
Monocytes Relative: 14 %
Neutro Abs: 4.9 10*3/uL (ref 1.7–7.7)
Neutrophils Relative %: 77 %
Platelets: 188 10*3/uL (ref 150–400)
RBC: 3.86 MIL/uL — ABNORMAL LOW (ref 3.87–5.11)
RDW: 14.4 % (ref 11.5–15.5)
WBC: 6.3 10*3/uL (ref 4.0–10.5)
nRBC: 0 % (ref 0.0–0.2)

## 2018-04-02 LAB — PHENYTOIN LEVEL, TOTAL: Phenytoin Lvl: <2.5 ug/mL — ABNORMAL LOW (ref 10.0–20.0)

## 2018-04-02 LAB — COMPREHENSIVE METABOLIC PANEL
ALT: 17 U/L (ref 0–44)
AST: 27 U/L (ref 15–41)
Albumin: 3.9 g/dL (ref 3.5–5.0)
Alkaline Phosphatase: 94 U/L (ref 38–126)
Anion gap: 12 (ref 5–15)
BUN: 38 mg/dL — ABNORMAL HIGH (ref 8–23)
CO2: 21 mmol/L — ABNORMAL LOW (ref 22–32)
Calcium: 9.4 mg/dL (ref 8.9–10.3)
Chloride: 105 mmol/L (ref 98–111)
Creatinine, Ser: 2.17 mg/dL — ABNORMAL HIGH (ref 0.44–1.00)
GFR calc Af Amer: 27 mL/min — ABNORMAL LOW (ref >60)
GFR calc non Af Amer: 23 mL/min — ABNORMAL LOW (ref >60)
Glucose, Bld: 121 mg/dL — ABNORMAL HIGH (ref 70–99)
Potassium: 3.2 mmol/L — ABNORMAL LOW (ref 3.5–5.1)
Sodium: 138 mmol/L (ref 135–145)
Total Bilirubin: 0.6 mg/dL (ref 0.3–1.2)
Total Protein: 8 g/dL (ref 6.5–8.1)

## 2018-04-02 LAB — INFLUENZA PANEL BY PCR (TYPE A & B)
Influenza A By PCR: POSITIVE — AB
Influenza B By PCR: NEGATIVE

## 2018-04-02 LAB — TSH: TSH: 0.018 u[IU]/mL — ABNORMAL LOW (ref 0.350–4.500)

## 2018-04-02 LAB — LACTIC ACID, PLASMA: Lactic Acid, Venous: 5 mmol/L (ref 0.5–1.9)

## 2018-04-02 LAB — CBG MONITORING, ED: Glucose-Capillary: 138 mg/dL — ABNORMAL HIGH (ref 70–99)

## 2018-04-02 MED ORDER — SODIUM CHLORIDE 0.9 % IV SOLN
2.0000 g | Freq: Once | INTRAVENOUS | Status: AC
  Administered 2018-04-02: 2 g via INTRAVENOUS
  Filled 2018-04-02: qty 2

## 2018-04-02 MED ORDER — SODIUM CHLORIDE 0.9 % IV BOLUS (SEPSIS)
500.0000 mL | Freq: Once | INTRAVENOUS | Status: AC
  Administered 2018-04-02: 500 mL via INTRAVENOUS

## 2018-04-02 MED ORDER — SODIUM CHLORIDE 0.9 % IV BOLUS (SEPSIS): 1000.0000 mL | Freq: Once | INTRAVENOUS | Status: DC

## 2018-04-02 MED ORDER — VANCOMYCIN HCL IN DEXTROSE 1-5 GM/200ML-% IV SOLN: 1000.0000 mg | Freq: Once | INTRAVENOUS | Status: DC

## 2018-04-02 MED ORDER — ACETAMINOPHEN 325 MG PO TABS
650.0000 mg | ORAL_TABLET | Freq: Once | ORAL | Status: AC | PRN
  Administered 2018-04-02: 650 mg via ORAL
  Filled 2018-04-02: qty 2

## 2018-04-02 MED ORDER — VANCOMYCIN HCL 10 G IV SOLR
2000.0000 mg | Freq: Once | INTRAVENOUS | Status: AC
  Administered 2018-04-03: 2000 mg via INTRAVENOUS
  Filled 2018-04-02 (×2): qty 2000

## 2018-04-02 MED ORDER — SODIUM CHLORIDE 0.9 % IV BOLUS (SEPSIS)
1000.0000 mL | Freq: Once | INTRAVENOUS | Status: AC
  Administered 2018-04-02: 1000 mL via INTRAVENOUS

## 2018-04-02 MED ORDER — METRONIDAZOLE IN NACL 5-0.79 MG/ML-% IV SOLN
500.0000 mg | Freq: Three times a day (TID) | INTRAVENOUS | Status: DC
  Administered 2018-04-02 – 2018-04-04 (×5): 500 mg via INTRAVENOUS
  Filled 2018-04-02 (×5): qty 100

## 2018-04-02 MED ORDER — ACETAMINOPHEN 325 MG PO TABS
325.0000 mg | ORAL_TABLET | Freq: Once | ORAL | Status: AC
  Administered 2018-04-02: 325 mg via ORAL
  Filled 2018-04-02: qty 1

## 2018-04-02 MED ORDER — ONDANSETRON HCL 4 MG/2ML IJ SOLN
4.0000 mg | Freq: Once | INTRAMUSCULAR | Status: AC
  Administered 2018-04-02: 4 mg via INTRAVENOUS
  Filled 2018-04-02: qty 2

## 2018-04-02 NOTE — Progress Notes (Signed)
Pharmacy Note:  Initial antibiotic(s) regimen of Vancomycin and cefepimeordered by EDP to treat unknown source of infection.  CrCl cannot be calculated (Patient's most recent lab result is older than the maximum 21 days allowed.).   Allergies  Allergen Reactions  . Simvastatin Other (See Comments)    Muscle aches    Vitals:   04/02/18 2215 04/02/18 2224  BP:    Resp: (!) 21   Temp:  (!) 104.1 F (40.1 C)    Anti-infectives (From admission, onward)   Start     Dose/Rate Route Frequency Ordered Stop   04/02/18 2300  vancomycin (VANCOCIN) 2,000 mg in sodium chloride 0.9 % 500 mL IVPB     2,000 mg 250 mL/hr over 120 Minutes Intravenous  Once 04/02/18 2252     04/02/18 2230  ceFEPIme (MAXIPIME) 2 g in sodium chloride 0.9 % 100 mL IVPB     2 g 200 mL/hr over 30 Minutes Intravenous  Once 04/02/18 2228     04/02/18 2230  metroNIDAZOLE (FLAGYL) IVPB 500 mg     500 mg 100 mL/hr over 60 Minutes Intravenous Every 8 hours 04/02/18 2228     04/02/18 2230  vancomycin (VANCOCIN) IVPB 1000 mg/200 mL premix  Status:  Discontinued     1,000 mg 200 mL/hr over 60 Minutes Intravenous  Once 04/02/18 2228 04/02/18 2252     Plan: Initial dose(s) of Vancomycin 2000mg  IV and Cefepime 2gm IV X 1 ordered. F/U admission orders for further dosing if therapy continued.  Cristy Friedlander, [facility] 04/02/2018 10:53 PM

## 2018-04-02 NOTE — ED Triage Notes (Signed)
Pt reports fever,bodyaches, sore throat since this am. Pt has not treated fever

## 2018-04-02 NOTE — ED Notes (Signed)
Pt had a witnessed seizure while sitting in the waiting room, pt has history of seizures and takes meds for same.  Pt was post ictal briefly and then was awake.  Pt was assisted onto a stretcher for room placement.   Pt maintained airway, no resp diff, nad

## 2018-04-02 NOTE — ED Notes (Signed)
Patient had very large loose bowel movement in clothes. Cleaned pt up and put pants in trash per family request.

## 2018-04-02 NOTE — ED Notes (Signed)
Patient had soiled herself. Patient is having explosive diarrhea at this time. Upon patient entering room no vitals signs had been documented.  Rectal temperature taken while EDP Dr Loraine Maple was in room patient temperature was 104. Had to change and clean patient.

## 2018-04-02 NOTE — ED Notes (Signed)
Patient's daughter at bedside. Patient soiled clothes placed in patient belonging bag. Patient had 64 dollars in pant pocket, counted money in front of patient and RN Lura Em. Money given to daughter at this time.

## 2018-04-02 NOTE — ED Notes (Signed)
Date and time results received: 04/02/18 11:29  (use smartphrase ".now" to insert current time)  Test: Lactic acid Critical Value: 5.0  Name of Provider Notified: [email or web address]  Orders Received? Or Actions Taken?: Actions Taken: monitor patient

## 2018-04-02 NOTE — ED Provider Notes (Signed)
[facility] EMERGENCY DEPARTMENT Provider Note   CSN: [id number] Arrival date & time: 04/02/18  2007     History   Chief Complaint Chief Complaint  Patient presents with  . Fever    HPI [name] is a 63 y.o. female.  Patient felt fine yesterday.  Patient with onset of flulike symptoms with body aches fever sore throat some cough and congestion and some loose bowel movements.  And some nausea.  No significant vomiting.  Patient's past medical history is significant for a known history of seizures and she is on Keppra for that.  Patient is also had thyroid removed and she is on Synthroid.  Past medical history is significant for obesity mental retardation which is mild hyperlipidemia seizure disorder hypertension history of thyroid cancer.  Patient had total thyroidectomy in 2014.  Also had abdominal hysterectomy in 2001.  Patient in the waiting room had a seizure.  Brief and a very brief postictal phase.  According family members typical for her seizure.  She was not incontinent.  Patient brought back immediately for evaluation when that occurred was first came back heart rate was in the 145 range sinus tach.  Patient was documented to have a fever of 103 and received 650 mg of Tylenol and in the waiting area.  This was at about 7 PM.  But she received Tylenol.  Patient states that she had the flu shot.     Past Medical History:  Diagnosis Date  . Depression   . Fractures   . Hyperlipidemia   . Hypertension   . Mental retardation, mild (I.Q. 50-70)   . Obesity   . Seizure ([place])    11/17/2012 "still having them; "I believe their from a tumor in my head" ((11/17/2012  . Seizure disorder ([place])   . Thyroid cancer [facility])     Patient Active Problem List   Diagnosis Date Noted  . Depression, major, single episode, mild ([place]) 03/21/2018  . CKD (chronic kidney disease) stage 3, GFR 30-59 ml/min (HCC) 04/03/2016  . GERD (gastroesophageal reflux disease) 08/23/2015  .  Osteoarthritis of right knee 08/15/2014  . 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 Reduced vision 05/03/2013  . 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 Vitamin D deficiency 08/16/2009  . POLYNEUROPATHY 04/10/2009  . Hyperlipidemia LDL goal <100 07/15/2007  . Morbid obesity ([place]) 07/15/2007  . Anxiety and depression 07/15/2007  . MENTAL RETARDATION, MILD 07/15/2007  . Essential hypertension 07/15/2007  . Convulsions ([place]) 07/15/2007    Past Surgical History:  Procedure Laterality Date  . ABDOMINAL HYSTERECTOMY  2001  . BREAST LUMPECTOMY  1996   right   . COLONOSCOPY N/A 05/16/2016   Procedure: COLONOSCOPY;  Surgeon: Danie Binder, MD;  Location[facility];  Service: Endoscopy;  Laterality: N/A;  830   . THYROIDECTOMY Bilateral 11/17/2012   Procedure: TOTAL THYROIDECTOMY;  Surgeon: Ascencion Dike, MD;  Location: [facility];  Service: ENT;  Laterality: Bilateral;  . TOTAL THYROIDECTOMY Bilateral 11/17/2012  . TUBAL LIGATION       OB History   No obstetric history on file.      Home Medications    Prior to Admission medications   Medication Sig Start Date End Date Taking? Authorizing Provider  acetaminophen (TYLENOL) 500 MG tablet Take 1 tablet (500 mg total) by mouth 2 (two) times daily. 08/15/14  Yes [name], MD  benazepril (LOTENSIN) 40 MG tablet TAKE (1) TABLET BY MOUTH DAILY. Patient taking differently: Take 40 mg  by mouth daily.  10/28/17  Yes [name], MD  calcitRIOL (ROCALTROL) 0.25 MCG capsule Take 0.25 mcg by mouth every Monday, Wednesday, and Friday. \   Yes [provider]  hydrochlorothiazide (HYDRODIURIL) 25 MG tablet TAKE ONE TABLET BY MOUTH ONCE DAILY. Patient taking differently: Take 25 mg by mouth daily.  10/27/17  Yes [name], MD  lamoTRIgine (LAMICTAL) 100 MG tablet Take 1 tablet (100 mg total) by mouth daily. 04/28/17  Yes [name], MD  levETIRAcetam (KEPPRA) 750 MG tablet Two tablets twice  daily Patient taking differently: Take 1,500 mg by mouth 2 (two) times daily.  04/28/17  Yes [name], MD  levothyroxine (SYNTHROID, LEVOTHROID) 137 MCG tablet TAKE 1 TABLET BY MOUTH DAILY BEFORE BREAKFAST. Patient taking differently: Take 137 mcg by mouth daily before breakfast.  10/06/17  Yes Nida, Marella Chimes, MD  loratadine (CLARITIN) 10 MG tablet TAKE ONE TABLET BY MOUTH ONCE DAILY. Patient taking differently: Take 10 mg by mouth daily.  10/27/17  Yes [name], MD  omeprazole (PRILOSEC) 20 MG capsule TAKE 2 CAPSULES BY MOUTH ONCE DAILY. Patient taking differently: Take 40 mg by mouth daily.  10/28/17  Yes [name], MD  rosuvastatin (CRESTOR) 40 MG tablet TAKE 1 TABLET BY MOUTH ONCE A DAY. Patient taking differently: Take 40 mg by mouth daily.  10/28/17  Yes [name], MD    Family History Family History  Problem Relation Age of Onset  . Diabetes Mother     Social History Social History   Tobacco Use  . Smoking status: Never Smoker  . Smokeless tobacco: Never Used  Substance Use Topics  . Alcohol use: No  . Drug use: No     Allergies   Simvastatin   Review of Systems Review of Systems  Constitutional: Positive for fever. Negative for chills.  HENT: Positive for congestion and sore throat. Negative for rhinorrhea.   Eyes: Negative for redness and visual disturbance.  Respiratory: Positive for cough. Negative for shortness of breath.   Cardiovascular: Negative for chest pain and leg swelling.  Gastrointestinal: Positive for diarrhea and nausea. Negative for abdominal pain and vomiting.  Genitourinary: Negative for dysuria.  Musculoskeletal: Positive for myalgias. Negative for back pain and neck pain.  Skin: Negative for rash.  Neurological: Positive for seizures. Negative for dizziness, light-headedness and headaches.  Hematological: Does not bruise/bleed easily.  Psychiatric/Behavioral: Negative for confusion.     Physical  Exam Updated Vital Signs BP 107/60   Pulse (!) 117   Temp (!) 101.2 F (38.4 C) (Oral)   Resp (!) 24   Ht 1.651 m (_0 )   Wt 112.9 kg   SpO2 93%   BMI 41.42 kg/m   Physical Exam Vitals signs and nursing note reviewed.  Constitutional:      General: She is in acute distress.     Appearance: She is well-developed. She is obese.  HENT:     Head: Normocephalic and atraumatic.     Mouth/Throat:     Mouth: Mucous membranes are dry.  Eyes:     Extraocular Movements: Extraocular movements intact.     Conjunctiva/sclera: Conjunctivae normal.     Pupils: Pupils are equal, round, and reactive to light.  Neck:     Musculoskeletal: Normal range of motion and neck supple. No neck rigidity.  Cardiovascular:     Rate and Rhythm: Regular rhythm. Tachycardia present.     Heart sounds: No murmur.  Pulmonary:  Effort: Pulmonary effort is normal. No respiratory distress.     Breath sounds: Normal breath sounds. No wheezing.  Abdominal:     General: Bowel sounds are normal.     Palpations: Abdomen is soft.     Tenderness: There is no abdominal tenderness.  Musculoskeletal: Normal range of motion.  Skin:    General: Skin is warm and dry.     Capillary Refill: Capillary refill takes less than 2 seconds.  Neurological:     General: No focal deficit present.     Mental Status: She is alert and oriented to person, place, and time.     Cranial Nerves: No cranial nerve deficit.     Sensory: No sensory deficit.     Motor: No weakness.      ED Treatments / Results  Labs (all labs ordered are listed, but only abnormal results are displayed) Labs Reviewed  LACTIC ACID, PLASMA - Abnormal; Notable for the following components:      Result Value   Lactic Acid, Venous 5.0 (*)    All other components within normal limits  COMPREHENSIVE METABOLIC PANEL - Abnormal; Notable for the following components:   Potassium 3.2 (*)    CO2 21 (*)    Glucose, Bld 121 (*)    BUN 38 (*)     Creatinine, Ser 2.17 (*)    GFR calc non Af Amer 23 (*)    GFR calc Af Amer 27 (*)    All other components within normal limits  CBC WITH DIFFERENTIAL/PLATELET - Abnormal; Notable for the following components:   RBC 3.86 (*)    Hemoglobin 10.2 (*)    HCT 32.8 (*)    Lymphs Abs 0.5 (*)    All other components within normal limits  PHENYTOIN LEVEL, TOTAL - Abnormal; Notable for the following components:   Phenytoin Lvl <2.5 (*)    All other components within normal limits  INFLUENZA PANEL BY PCR (TYPE A & B) - Abnormal; Notable for the following components:   Influenza A By PCR POSITIVE (*)    All other components within normal limits  CBG MONITORING, ED - Abnormal; Notable for the following components:   Glucose-Capillary 138 (*)    All other components within normal limits  CULTURE, BLOOD (ROUTINE X 2)  CULTURE, BLOOD (ROUTINE X 2)  LACTIC ACID, PLASMA  URINALYSIS, ROUTINE W REFLEX MICROSCOPIC  TSH    EKG EKG Interpretation  Date/Time:  Friday April 02 2018 22:04:15 EST Ventricular Rate:  147 PR Interval:    QRS Duration: 77 QT Interval:  343 QTC Calculation: 537 R Axis:   47 Text Interpretation:  Sinus tachycardia ST depression, probably rate related Prolonged QT interval Confirmed by Fredia Sorrow [phone]) on 04/02/2018 10:18:18 PM   Radiology Dg Chest Port 1 View  Result Date: 04/02/2018 CLINICAL DATA:  63 year old female with productive cough, fever, body ache, sore throat. EXAM: PORTABLE CHEST 1 VIEW COMPARISON:  Chest radiographs 11/17/2012. FINDINGS: Portable AP upright view at 2254 hours. Chronic staple line in the right upper lobe. Lung volumes and mediastinal contours have not significantly changed since 2014. Visualized tracheal air column is within normal limits. No pneumothorax or pulmonary edema. No pleural effusion or consolidation. Mild basilar predominant increased interstitial markings appear stable allowing for portable technique. Paucity of bowel gas in  the upper abdomen. No acute osseous abnormality identified. IMPRESSION: Stable since 2014 when allowing for portable technique. No acute cardiopulmonary abnormality. Electronically Signed   By: Herminio Heads.D.  On: 04/02/2018 23:10    Procedures Procedures (including critical care time)  CRITICAL CARE Performed by: Fredia Sorrow Total critical care time: 30 minutes Critical care time was exclusive of separately billable procedures and treating other patients. Critical care was necessary to treat or prevent imminent or life-threatening deterioration. Critical care was time spent personally by me on the following activities: development of treatment plan with patient and/or surrogate as well as nursing, discussions with consultants, evaluation of patient's response to treatment, examination of patient, obtaining history from patient or surrogate, ordering and performing treatments and interventions, ordering and review of laboratory studies, ordering and review of radiographic studies, pulse oximetry and re-evaluation of patient's condition.   Medications Ordered in ED Medications  metroNIDAZOLE (FLAGYL) IVPB 500 mg (500 mg Intravenous New Bag/Given 04/02/18 2333)  sodium chloride 0.9 % bolus 1,000 mL (1,000 mLs Intravenous New Bag/Given 04/02/18 2316)    And  sodium chloride 0.9 % bolus 1,000 mL (1,000 mLs Intravenous New Bag/Given 04/02/18 2317)    And  sodium chloride 0.9 % bolus 1,000 mL (has no administration in time range)    And  sodium chloride 0.9 % bolus 500 mL (has no administration in time range)  vancomycin (VANCOCIN) 2,000 mg in sodium chloride 0.9 % 500 mL IVPB (has no administration in time range)  acetaminophen (TYLENOL) tablet 650 mg (650 mg Oral Given 04/02/18 2029)  acetaminophen (TYLENOL) tablet 325 mg (325 mg Oral Given 04/02/18 2259)  ceFEPIme (MAXIPIME) 2 g in sodium chloride 0.9 % 100 mL IVPB (0 g Intravenous Stopped 04/02/18 2343)  ondansetron (ZOFRAN) injection 4 mg (4 mg  Intravenous Given 04/02/18 2259)     Initial Impression / Assessment and Plan / ED Course  I have reviewed the triage vital signs and the nursing notes.  Pertinent labs & imaging results that were available during my care of the patient were reviewed by me and considered in my medical decision making (see chart for details).    Patient with the fever here of 104 when she was brought back.  As well as the tachycardia met sepsis criteria.  Patient when she first brought back was slightly confused.  But she rapidly gained normal mental function and answers all questions.  Initially she told me she was taking Dilantin for her seizures but as I reflected back she says she had made a mistake she was little confused that she is definitely was taking Keppra.  Her Dilantin level was 0 so that would make sense.  Patient's influenza test was positive patient's chest x-ray negative for pneumonia.  Patient due to this had sepsis criteria started based on her vital signs.  She is to receive broad-spectrum antibiotics and received 30 cc/kg of normal saline fluids.  The fluids helped her significantly.  We knew she had stage III renal insufficiency prior to this.  But fluids have really made her feel better.  Never had any concern for meningitis no headache.  Mentating fine.  Feel that is all based on influenza.  Lactic acid was significant elevated at 5 but that also could have been in combo following the seizure.  But either way patient needs admission for the initial sepsis concerning for the influenza a.  Gust with Dr. Darrick Meigs who will admit.  At the time of discussion with Dr. Darrick Meigs patient's heart rate was down to 114 blood pressure was in the 120s.  Oxygen sats on room air was 97%.  Patient was feeling much better.   Final Clinical  Impressions(s) / ED Diagnoses   Final diagnoses:  Sepsis, due to unspecified organism, unspecified whether acute organ dysfunction present [facility])  Influenza A    ED Discharge  Orders    None       Fredia Sorrow, MD 04/03/18 [phone]

## 2018-04-03 ENCOUNTER — Inpatient Hospital Stay (HOSPITAL_COMMUNITY): Payer: Medicaid Other

## 2018-04-03 ENCOUNTER — Other Ambulatory Visit: Payer: Self-pay

## 2018-04-03 DIAGNOSIS — J101 Influenza due to other identified influenza virus with other respiratory manifestations: Secondary | ICD-10-CM | POA: Diagnosis present

## 2018-04-03 DIAGNOSIS — E872 Acidosis: Secondary | ICD-10-CM | POA: Diagnosis not present

## 2018-04-03 DIAGNOSIS — Z888 Allergy status to other drugs, medicaments and biological substances status: Secondary | ICD-10-CM | POA: Diagnosis not present

## 2018-04-03 DIAGNOSIS — Z85850 Personal history of malignant neoplasm of thyroid: Secondary | ICD-10-CM | POA: Diagnosis not present

## 2018-04-03 DIAGNOSIS — Z833 Family history of diabetes mellitus: Secondary | ICD-10-CM | POA: Diagnosis not present

## 2018-04-03 DIAGNOSIS — Z90710 Acquired absence of both cervix and uterus: Secondary | ICD-10-CM | POA: Diagnosis not present

## 2018-04-03 DIAGNOSIS — K219 Gastro-esophageal reflux disease without esophagitis: Secondary | ICD-10-CM | POA: Diagnosis present

## 2018-04-03 DIAGNOSIS — G40909 Epilepsy, unspecified, not intractable, without status epilepticus: Secondary | ICD-10-CM | POA: Diagnosis not present

## 2018-04-03 DIAGNOSIS — F329 Major depressive disorder, single episode, unspecified: Secondary | ICD-10-CM | POA: Diagnosis present

## 2018-04-03 DIAGNOSIS — N179 Acute kidney failure, unspecified: Secondary | ICD-10-CM | POA: Diagnosis present

## 2018-04-03 DIAGNOSIS — Z79899 Other long term (current) drug therapy: Secondary | ICD-10-CM | POA: Diagnosis not present

## 2018-04-03 DIAGNOSIS — Z79890 Hormone replacement therapy (postmenopausal): Secondary | ICD-10-CM | POA: Diagnosis not present

## 2018-04-03 DIAGNOSIS — R569 Unspecified convulsions: Secondary | ICD-10-CM

## 2018-04-03 DIAGNOSIS — I10 Essential (primary) hypertension: Secondary | ICD-10-CM | POA: Diagnosis not present

## 2018-04-03 DIAGNOSIS — A419 Sepsis, unspecified organism: Principal | ICD-10-CM | POA: Diagnosis present

## 2018-04-03 DIAGNOSIS — I129 Hypertensive chronic kidney disease with stage 1 through stage 4 chronic kidney disease, or unspecified chronic kidney disease: Secondary | ICD-10-CM | POA: Diagnosis present

## 2018-04-03 DIAGNOSIS — N183 Chronic kidney disease, stage 3 (moderate): Secondary | ICD-10-CM | POA: Diagnosis present

## 2018-04-03 DIAGNOSIS — G40409 Other generalized epilepsy and epileptic syndromes, not intractable, without status epilepticus: Secondary | ICD-10-CM | POA: Diagnosis present

## 2018-04-03 DIAGNOSIS — E785 Hyperlipidemia, unspecified: Secondary | ICD-10-CM | POA: Diagnosis present

## 2018-04-03 DIAGNOSIS — A413 Sepsis due to Hemophilus influenzae: Secondary | ICD-10-CM | POA: Diagnosis not present

## 2018-04-03 DIAGNOSIS — R197 Diarrhea, unspecified: Secondary | ICD-10-CM | POA: Diagnosis present

## 2018-04-03 DIAGNOSIS — F70 Mild intellectual disabilities: Secondary | ICD-10-CM | POA: Diagnosis present

## 2018-04-03 DIAGNOSIS — Z6841 Body Mass Index (BMI) 40.0 and over, adult: Secondary | ICD-10-CM | POA: Diagnosis not present

## 2018-04-03 DIAGNOSIS — E890 Postprocedural hypothyroidism: Secondary | ICD-10-CM | POA: Diagnosis present

## 2018-04-03 DIAGNOSIS — G629 Polyneuropathy, unspecified: Secondary | ICD-10-CM | POA: Diagnosis present

## 2018-04-03 DIAGNOSIS — E876 Hypokalemia: Secondary | ICD-10-CM | POA: Diagnosis present

## 2018-04-03 LAB — COMPREHENSIVE METABOLIC PANEL
ALT: 15 U/L (ref 0–44)
AST: 21 U/L (ref 15–41)
Albumin: 3.2 g/dL — ABNORMAL LOW (ref 3.5–5.0)
Alkaline Phosphatase: 82 U/L (ref 38–126)
Anion gap: 6 (ref 5–15)
BUN: 36 mg/dL — ABNORMAL HIGH (ref 8–23)
CO2: 21 mmol/L — ABNORMAL LOW (ref 22–32)
Calcium: 8.5 mg/dL — ABNORMAL LOW (ref 8.9–10.3)
Chloride: 112 mmol/L — ABNORMAL HIGH (ref 98–111)
Creatinine, Ser: 1.79 mg/dL — ABNORMAL HIGH (ref 0.44–1.00)
GFR calc Af Amer: 34 mL/min — ABNORMAL LOW (ref >60)
GFR calc non Af Amer: 30 mL/min — ABNORMAL LOW (ref >60)
Glucose, Bld: 114 mg/dL — ABNORMAL HIGH (ref 70–99)
Potassium: 3.9 mmol/L (ref 3.5–5.1)
Sodium: 139 mmol/L (ref 135–145)
Total Bilirubin: 0.5 mg/dL (ref 0.3–1.2)
Total Protein: 7.1 g/dL (ref 6.5–8.1)

## 2018-04-03 LAB — CBC
HCT: 29 % — ABNORMAL LOW (ref 36.0–46.0)
Hemoglobin: 9.2 g/dL — ABNORMAL LOW (ref 12.0–15.0)
MCH: 27 pg (ref 26.0–34.0)
MCHC: 31.7 g/dL (ref 30.0–36.0)
MCV: 85 fL (ref 80.0–100.0)
Platelets: 154 10*3/uL (ref 150–400)
RBC: 3.41 MIL/uL — ABNORMAL LOW (ref 3.87–5.11)
RDW: 14.3 % (ref 11.5–15.5)
WBC: 5.5 10*3/uL (ref 4.0–10.5)
nRBC: 0 % (ref 0.0–0.2)

## 2018-04-03 LAB — URINALYSIS, ROUTINE W REFLEX MICROSCOPIC
Bilirubin Urine: NEGATIVE
Glucose, UA: NEGATIVE mg/dL
Ketones, ur: NEGATIVE mg/dL
Nitrite: NEGATIVE
Protein, ur: NEGATIVE mg/dL
Specific Gravity, Urine: 1.014 (ref 1.005–1.030)
pH: 5 (ref 5.0–8.0)

## 2018-04-03 LAB — LACTIC ACID, PLASMA: Lactic Acid, Venous: 7.7 mmol/L (ref 0.5–1.9)

## 2018-04-03 MED ORDER — ROSUVASTATIN CALCIUM 20 MG PO TABS
40.0000 mg | ORAL_TABLET | Freq: Every day | ORAL | Status: DC
  Administered 2018-04-04 – 2018-04-06 (×3): 40 mg via ORAL
  Filled 2018-04-03 (×6): qty 2

## 2018-04-03 MED ORDER — LORAZEPAM 2 MG/ML IJ SOLN
2.0000 mg | Freq: Once | INTRAMUSCULAR | Status: AC
  Administered 2018-04-03: 2 mg via INTRAVENOUS

## 2018-04-03 MED ORDER — ONDANSETRON HCL 4 MG/2ML IJ SOLN: 4.0000 mg | Freq: Four times a day (QID) | INTRAMUSCULAR | Status: DC | PRN

## 2018-04-03 MED ORDER — SODIUM CHLORIDE 0.9 % IV SOLN
INTRAVENOUS | Status: DC
  Administered 2018-04-03 – 2018-04-06 (×7): via INTRAVENOUS

## 2018-04-03 MED ORDER — ONDANSETRON HCL 4 MG PO TABS
4.0000 mg | ORAL_TABLET | Freq: Four times a day (QID) | ORAL | Status: DC | PRN
  Filled 2018-04-03: qty 1

## 2018-04-03 MED ORDER — LEVETIRACETAM 500 MG PO TABS
1500.0000 mg | ORAL_TABLET | Freq: Two times a day (BID) | ORAL | Status: DC
  Administered 2018-04-03 – 2018-04-06 (×7): 1500 mg via ORAL
  Filled 2018-04-03 (×7): qty 3

## 2018-04-03 MED ORDER — LAMOTRIGINE 100 MG PO TABS
100.0000 mg | ORAL_TABLET | Freq: Every day | ORAL | Status: DC
  Administered 2018-04-03 – 2018-04-06 (×4): 100 mg via ORAL
  Filled 2018-04-03: qty 1
  Filled 2018-04-03: qty 4
  Filled 2018-04-03 (×2): qty 1

## 2018-04-03 MED ORDER — OSELTAMIVIR PHOSPHATE 30 MG PO CAPS
30.0000 mg | ORAL_CAPSULE | Freq: Two times a day (BID) | ORAL | Status: DC
  Administered 2018-04-03 – 2018-04-06 (×7): 30 mg via ORAL
  Filled 2018-04-03 (×7): qty 1

## 2018-04-03 MED ORDER — LORAZEPAM 2 MG/ML IJ SOLN: 2.0000 mg | INTRAMUSCULAR | Status: DC | PRN

## 2018-04-03 MED ORDER — POTASSIUM CHLORIDE 10 MEQ/100ML IV SOLN
INTRAVENOUS | Status: AC
  Filled 2018-04-03: qty 200

## 2018-04-03 MED ORDER — LEVETIRACETAM IN NACL 1000 MG/100ML IV SOLN
1000.0000 mg | Freq: Once | INTRAVENOUS | Status: AC
  Administered 2018-04-03: 1000 mg via INTRAVENOUS
  Filled 2018-04-03: qty 100

## 2018-04-03 MED ORDER — POTASSIUM CHLORIDE 10 MEQ/100ML IV SOLN
10.0000 meq | INTRAVENOUS | Status: DC
  Administered 2018-04-03: 10 meq via INTRAVENOUS
  Filled 2018-04-03: qty 100

## 2018-04-03 MED ORDER — LEVOTHYROXINE SODIUM 25 MCG PO TABS
137.0000 ug | ORAL_TABLET | Freq: Every day | ORAL | Status: DC
  Administered 2018-04-03 – 2018-04-06 (×4): 137 ug via ORAL
  Filled 2018-04-03 (×6): qty 1

## 2018-04-03 MED ORDER — POTASSIUM CHLORIDE 10 MEQ/100ML IV SOLN
10.0000 meq | INTRAVENOUS | Status: AC
  Administered 2018-04-03 (×2): 10 meq via INTRAVENOUS

## 2018-04-03 MED ORDER — SODIUM CHLORIDE 0.9 % IV SOLN
2.0000 g | INTRAVENOUS | Status: DC
  Administered 2018-04-03: 2 g via INTRAVENOUS
  Filled 2018-04-03 (×3): qty 2

## 2018-04-03 MED ORDER — TRAMADOL HCL 50 MG PO TABS
50.0000 mg | ORAL_TABLET | Freq: Once | ORAL | Status: AC
  Administered 2018-04-03: 50 mg via ORAL
  Filled 2018-04-03: qty 1

## 2018-04-03 MED ORDER — PANTOPRAZOLE SODIUM 40 MG IV SOLR
40.0000 mg | INTRAVENOUS | Status: DC
  Administered 2018-04-03 – 2018-04-05 (×3): 40 mg via INTRAVENOUS
  Filled 2018-04-03 (×3): qty 40

## 2018-04-03 MED ORDER — VANCOMYCIN HCL 10 G IV SOLR
1250.0000 mg | INTRAVENOUS | Status: DC
  Administered 2018-04-04: 1250 mg via INTRAVENOUS
  Filled 2018-04-03 (×2): qty 1250

## 2018-04-03 NOTE — ED Notes (Signed)
Patient has had EKG done post seizure activity. Dr Roxanne Mins and Dr Darrick Meigs made aware of patient's status

## 2018-04-03 NOTE — ED Notes (Signed)
Pt is not alert enough to take POs.

## 2018-04-03 NOTE — ED Notes (Signed)
Patient transported to CT 

## 2018-04-03 NOTE — ED Notes (Signed)
Pt cleaned of mild BM. Pt alert when aroused.

## 2018-04-03 NOTE — Progress Notes (Signed)
Patient seen and examined.  Admitted after midnight secondary to general malaise, fever and no feeling well.  Patient experience active seizures x2 (witnessed by emergency department staff).  Found to have influenza A infection, acute kidney injury, elevated lactic acid after convulsions and initially postictal state.  On my examination she denies headaches, photophobia, blurred vision or any other complaints to suggest meningitis.  She continue no feeling well and expressing myalgias.  Patient was oriented x3, able to follow commands appropriately and in no major distress currently.  Will follow culture results, continue antibiotic for now with a very low threshold to discontinue everything if no other signs of infection appreciated.  At this moment continue Tamiflu, antiepileptic drugs and supportive care.  Continue IV fluids and follow renal function trend.  Patient will be observed in a stepdown bed at least for the next 24 hours.  Advance diet.  Please refer to H&P written by Dr. Darrick Meigs for further info/details on admission.  Plan: -continue IVF's -continue tamiflu -watch for the next 24 on current abx's, but follow culture results and MRSA PCR; very low threshold to discontinue antibiotics. -continue supportive care and advance diet -given lack of meningism signs/symptoms; will discontinue LP. -CT head neg for acute abnormalities.     Barton Dubois MD [phone].

## 2018-04-03 NOTE — ED Notes (Signed)
Pt minimal assisted to wheelchair to go to restroom. Pt ambulated to toilet. RN retrieved washcloths and pt began to have grand mal seizure. Pt post-itcal

## 2018-04-03 NOTE — Progress Notes (Signed)
Pharmacy Antibiotic Note  [name] is a 63 y.o. female admitted on 04/02/2018 with unknown source of infection .  Pharmacy has been consulted for Vancomycin and cefepime dosing.  Plan: Vancomycin 2000mg  Loading dose, then 1250mg  IV every 24 hours.  Goal trough 15-20 mcg/mL.  Cefepime 2gm IV q24h F/U cxs and clinical progress Monitor V/S, labs and levels as indicated  Height: 5\' 5"  (165.1 cm) Weight: 248 lb 14.4 oz (112.9 kg) IBW/kg (Calculated) : 57  Temp (24hrs), Avg:101.8 F (38.8 C), Min:99.8 F (37.7 C), Max:104.1 F (40.1 C)  Recent Labs  Lab 04/02/18 2250 04/03/18 0141 04/03/18 0552  WBC 6.3  --  5.5  CREATININE 2.17*  --  1.79*  LATICACIDVEN 5.0* 7.7*  --     Estimated Creatinine Clearance: 40.3 mL/min (A) (by C-G formula based on SCr of 1.79 mg/dL (H)).    Allergies  Allergen Reactions  . Simvastatin Other (See Comments)    Muscle aches    Antimicrobials this admission: Vancomycin 2/7>>  Cefepime 2/7 >>   Dose adjustments this admission: n/a  Microbiology results: 2/7 BCx: pending  MRSA PCR:   Thank you for allowing pharmacy to be a part of this patient's care.  Isac Sarna, BS Pharm D, [place] Clinical Pharmacist Pager [phone] 04/03/2018 9:13 AM

## 2018-04-03 NOTE — H&P (Signed)
TRH H&P    Patient Demographics:    [name], is a 63 y.o. female  MRN: [id number]  DOB - 1955/06/25  Admit Date - 04/02/2018  Referring MD/NP/PA: Aundria Mems  Outpatient Primary MD for the patient is [name], MD  Patient coming from: Home  Chief complaint-fever   HPI:    [name]  is a 63 y.o. female, with history of hypertension, seizure disorder, mild mental retardation, obesity, thyroid cancer, depression came to ED with complaints of fever.  Patient initial complaints were flulike symptoms due to body aches, fever, sore throat.  While waiting in the triage patient had a seizure, initial lactic acid drawn after seizure was 5.0.  Unfortunately patient had another episode of seizure while in the bathroom, 2 mg of IV Ativan and 1 g of Keppra given.  Patient is unable to provide any history, she is postictal.  Influenza PCR test came back positive for influenza A.  Repeat lactic acid is elevated 7.7, (again drawn after second seizure episode).  Patient has been started on vancomycin, cefepime.  She also had 2 loose bowel movements in the ED.     Review of systems:    Patient is post ictal, no other review of systems obtained    Past History of the following :    Past Medical History:  Diagnosis Date  . Depression   . Fractures   . Hyperlipidemia   . Hypertension   . Mental retardation, mild (I.Q. 50-70)   . Obesity   . Seizure ([place])    11/17/2012 "still having them; "I believe their from a tumor in my head" ((11/17/2012  . Seizure disorder ([place])   . Thyroid cancer [facility])       Past Surgical History:  Procedure Laterality Date  . ABDOMINAL HYSTERECTOMY  2001  . BREAST LUMPECTOMY  1996   right   . COLONOSCOPY N/A 05/16/2016   Procedure: COLONOSCOPY;  Surgeon: Danie Binder, MD;  [facility];  Service: Endoscopy;  Laterality: N/A;  830   . THYROIDECTOMY  Bilateral 11/17/2012   Procedure: TOTAL THYROIDECTOMY;  Surgeon: Ascencion Dike, MD;  Location: [place];  Service: ENT;  Laterality: Bilateral;  . TOTAL THYROIDECTOMY Bilateral 11/17/2012  . TUBAL LIGATION        Social History:      Social History   Tobacco Use  . Smoking status: Never Smoker  . Smokeless tobacco: Never Used  Substance Use Topics  . Alcohol use: No       Family History :     Family History  Problem Relation Age of Onset  . Diabetes Mother       Home Medications:   Prior to Admission medications   Medication Sig Start Date End Date Taking? Authorizing Provider  acetaminophen (TYLENOL) 500 MG tablet Take 1 tablet (500 mg total) by mouth 2 (two) times daily. 08/15/14  Yes [name], MD  benazepril (LOTENSIN) 40 MG tablet TAKE (1) TABLET BY MOUTH DAILY. Patient taking differently: Take 40 mg by  mouth daily.  10/28/17  Yes [name], MD  calcitRIOL (ROCALTROL) 0.25 MCG capsule Take 0.25 mcg by mouth every Monday, Wednesday, and Friday. \   Yes [provider]  hydrochlorothiazide (HYDRODIURIL) 25 MG tablet TAKE ONE TABLET BY MOUTH ONCE DAILY. Patient taking differently: Take 25 mg by mouth daily.  10/27/17  Yes [name], MD  lamoTRIgine (LAMICTAL) 100 MG tablet Take 1 tablet (100 mg total) by mouth daily. 04/28/17  Yes [name], MD  levETIRAcetam (KEPPRA) 750 MG tablet Two tablets twice daily Patient taking differently: Take 1,500 mg by mouth 2 (two) times daily.  04/28/17  Yes [name], MD  levothyroxine (SYNTHROID, LEVOTHROID) 137 MCG tablet TAKE 1 TABLET BY MOUTH DAILY BEFORE BREAKFAST. Patient taking differently: Take 137 mcg by mouth daily before breakfast.  10/06/17  Yes Nida, Marella Chimes, MD  loratadine (CLARITIN) 10 MG tablet TAKE ONE TABLET BY MOUTH ONCE DAILY. Patient taking differently: Take 10 mg by mouth daily.  10/27/17  Yes [name], MD  omeprazole (PRILOSEC) 20 MG capsule TAKE 2  CAPSULES BY MOUTH ONCE DAILY. Patient taking differently: Take 40 mg by mouth daily.  10/28/17  Yes [name], MD  rosuvastatin (CRESTOR) 40 MG tablet TAKE 1 TABLET BY MOUTH ONCE A DAY. Patient taking differently: Take 40 mg by mouth daily.  10/28/17  Yes [name], MD     Allergies:     Allergies  Allergen Reactions  . Simvastatin Other (See Comments)    Muscle aches     Physical Exam:   Vitals  Blood pressure (!) 106/43, pulse (!) 112, temperature (!) 100.8 F (38.2 C), temperature source Oral, resp. rate (!) 22, height 5\' 5"  (1.651 m), weight 112.9 kg, SpO2 97 %.  1.  General: Somnolent but arousable  2. Psychiatric: Somnolent, arousable  3. Neurologic: Moving all extremities, postictal  4. HEENMT:  Atraumatic normocephalic, oral mucosa is moist, dried blood noted on the lip.  No tongue biting noted.  5. Respiratory : Clear to auscultation bilaterally, no wheezing or crackles.  6. Cardiovascular : S1-S2, regular, no murmur auscultated.  7. Gastrointestinal:  Abdomen is soft, nontender, no organomegaly      Data Review:    CBC Recent Labs  Lab 04/02/18 2250  WBC 6.3  HGB 10.2*  HCT 32.8*  PLT 188  MCV 85.0  MCH 26.4  MCHC 31.1  RDW 14.4  LYMPHSABS 0.5*  MONOABS 0.9  EOSABS 0.0  BASOSABS 0.0   ------------------------------------------------------------------------------------------------------------------  Results for orders placed or performed during the hospital encounter of 04/02/18 (from the past 48 hour(s))  CBG monitoring, ED     Status: Abnormal   Collection Time: 04/02/18 10:01 PM  Result Value Ref Range   Glucose-Capillary 138 (H) 70 - 99 mg/dL  Lactic acid, plasma     Status: Abnormal   Collection Time: 04/02/18 10:50 PM  Result Value Ref Range   Lactic Acid, Venous 5.0 (HH) 0.5 - 1.9 mmol/L    Comment: CRITICAL RESULT CALLED TO, READ BACK BY AND VERIFIED WITH: EASTWOOD,J. AT 2328 ON 04/02/2018 BY EVA Performed  at [facility], [address], [place], [place] [address]   Comprehensive metabolic panel     Status: Abnormal   Collection Time: 04/02/18 10:50 PM  Result Value Ref Range   Sodium 138 135 - 145 mmol/L   Potassium 3.2 (L) 3.5 - 5.1 mmol/L   Chloride 105 98 - 111 mmol/L   CO2 21 (  L) 22 - 32 mmol/L   Glucose, Bld 121 (H) 70 - 99 mg/dL   BUN 38 (H) 8 - 23 mg/dL   Creatinine, Ser 2.17 (H) 0.44 - 1.00 mg/dL   Calcium 9.4 8.9 - 10.3 mg/dL   Total Protein 8.0 6.5 - 8.1 g/dL   Albumin 3.9 3.5 - 5.0 g/dL   AST 27 15 - 41 U/L   ALT 17 0 - 44 U/L   Alkaline Phosphatase 94 38 - 126 U/L   Total Bilirubin 0.6 0.3 - 1.2 mg/dL   GFR calc non Af Amer 23 (L) >60 mL/min   GFR calc Af Amer 27 (L) >60 mL/min   Anion gap 12 5 - 15    Comment: Performed at [facility], [address] [place], [place] [address]  CBC WITH DIFFERENTIAL     Status: Abnormal   Collection Time: 04/02/18 10:50 PM  Result Value Ref Range   WBC 6.3 4.0 - 10.5 K/uL   RBC 3.86 (L) 3.87 - 5.11 MIL/uL   Hemoglobin 10.2 (L) 12.0 - 15.0 g/dL   HCT 32.8 (L) 36.0 - 46.0 %   MCV 85.0 80.0 - 100.0 fL   MCH 26.4 26.0 - 34.0 pg   MCHC 31.1 30.0 - 36.0 g/dL   RDW 14.4 11.5 - 15.5 %   Platelets 188 150 - 400 K/uL   nRBC 0.0 0.0 - 0.2 %   Neutrophils Relative % 77 %   Neutro Abs 4.9 1.7 - 7.7 K/uL   Lymphocytes Relative 7 %   Lymphs Abs 0.5 (L) 0.7 - 4.0 K/uL   Monocytes Relative 14 %   Monocytes Absolute 0.9 0.1 - 1.0 K/uL   Eosinophils Relative 0 %   Eosinophils Absolute 0.0 0.0 - 0.5 K/uL   Basophils Relative 1 %   Basophils Absolute 0.0 0.0 - 0.1 K/uL   Immature Granulocytes 1 %   Abs Immature Granulocytes 0.04 0.00 - 0.07 K/uL    Comment: Performed at [facility], [address] [place], [place] [address]  TSH     Status: Abnormal   Collection Time: 04/02/18 10:50 PM  Result Value Ref Range   TSH 0.018 (L) 0.350 - 4.500 uIU/mL    Comment: Performed by a 3rd Generation assay with a functional sensitivity of <=0.01  uIU/mL. Performed at [facility], [address], [place], [place] [address]   Phenytoin level, total     Status: Abnormal   Collection Time: 04/02/18 10:50 PM  Result Value Ref Range   Phenytoin Lvl <2.5 (L) 10.0 - 20.0 ug/mL    Comment: Performed at [facility], [address], [place], [place] [address]  Influenza panel by PCR (type A & B)     Status: Abnormal   Collection Time: 04/02/18 10:50 PM  Result Value Ref Range   Influenza A By PCR POSITIVE (A) NEGATIVE   Influenza B By PCR NEGATIVE NEGATIVE    Comment: (NOTE) The Xpert Xpress Flu assay is intended as an aid in the diagnosis of  influenza and should not be used as a sole basis for treatment.  This  assay is FDA approved for nasopharyngeal swab specimens only. Nasal  washings and aspirates are unacceptable for Xpert Xpress Flu testing. Performed at [facility], [address], [place], [place] [address]   Blood Culture (routine x 2)     Status: None (Preliminary result)   Collection Time: 04/02/18 11:27 PM  Result Value Ref Range   Specimen Description BLOOD LEFT  HAND    Special Requests      BOTTLES DRAWN AEROBIC ONLY Blood Culture adequate volume Performed at [facility], [address] [place], [place] [address]    Culture PENDING    Report Status PENDING   Lactic acid, plasma     Status: Abnormal   Collection Time: 04/03/18  1:41 AM  Result Value Ref Range   Lactic Acid, Venous 7.7 (HH) 0.5 - 1.9 mmol/L    Comment: CRITICAL RESULT CALLED TO, READ BACK BY AND VERIFIED WITH: EASTWOOD,J @ 0202 ON 04/03/18 BY JUW Performed at [facility], [address], [place], [place] [address]     Chemistries  Recent Labs  Lab 04/02/18 2250  NA 138  K 3.2*  CL 105  CO2 21*  GLUCOSE 121*  BUN 38*  CREATININE 2.17*  CALCIUM 9.4  AST 27  ALT 17  ALKPHOS 94  BILITOT 0.6    ------------------------------------------------------------------------------------------------------------------  ------------------------------------------------------------------------------------------------------------------ GFR: Estimated Creatinine Clearance: 33.3 mL/min (A) (by C-G formula based on SCr of 2.17 mg/dL (H)). Liver Function Tests: Recent Labs  Lab 04/02/18 2250  AST 27  ALT 17  ALKPHOS 94  BILITOT 0.6  PROT 8.0  ALBUMIN 3.9    Recent Labs  Lab 04/02/18 2201  GLUCAP 138*   Lipid Profile: No results for input(s): CHOL, HDL, LDLCALC, TRIG, CHOLHDL, LDLDIRECT in the last 72 hours. Thyroid Function Tests: Recent Labs    03/31/18 0821 04/02/18 2250  TSH 0.02* 0.018*  FREET4 1.7  --    Anemia Panel: No results for input(s): VITAMINB12, FOLATE, FERRITIN, TIBC, IRON, RETICCTPCT in the last 72 hours.  --------------------------------------------------------------------------------------------------------------- Urine analysis:    Component Value Date/Time   COLORURINE YELLOW 03/10/2018 1742   APPEARANCEUR HAZY (A) 03/10/2018 1742   LABSPEC 1.019 03/10/2018 1742   PHURINE 5.0 03/10/2018 1742   GLUCOSEU NEGATIVE 03/10/2018 1742   HGBUR MODERATE (A) 03/10/2018 1742   HGBUR small 04/10/2009 0845   BILIRUBINUR NEGATIVE 03/10/2018 1742   BILIRUBINUR neg 04/10/2017 1135   KETONESUR NEGATIVE 03/10/2018 1742   PROTEINUR 30 (A) 03/10/2018 1742   UROBILINOGEN 0.2 04/10/2017 1135   UROBILINOGEN 0.2 04/10/2009 0845   NITRITE NEGATIVE 03/10/2018 1742   LEUKOCYTESUR SMALL (A) 03/10/2018 1742      Imaging Results:    Dg Chest Port 1 View  Result Date: 04/02/2018 CLINICAL DATA:  63 year old female with productive cough, fever, body ache, sore throat. EXAM: PORTABLE CHEST 1 VIEW COMPARISON:  Chest radiographs 11/17/2012. FINDINGS: Portable AP upright view at 2254 hours. Chronic staple line in the right upper lobe. Lung volumes and mediastinal contours have  not significantly changed since 2014. Visualized tracheal air column is within normal limits. No pneumothorax or pulmonary edema. No pleural effusion or consolidation. Mild basilar predominant increased interstitial markings appear stable allowing for portable technique. Paucity of bowel gas in the upper abdomen. No acute osseous abnormality identified. IMPRESSION: Stable since 2014 when allowing for portable technique. No acute cardiopulmonary abnormality. Electronically Signed   By: Genevie Ann M.D.   On: 04/02/2018 23:10    My personal review of EKG: Rhythm NSR   Assessment & Plan:    Active Problems:   Sepsis ([place])   1. Sepsis-patient presenting with fever, tachycardia,Tachypnea.  Influenza PCR positive for influenza A.  Patient empirically started on vancomycin, cefepime.  Lactate elevated 7.7, drawn after 2 episodes of seizures in the ED.  Will repeat lactic acid in 2 hours.  We will continue with antibiotics at this time.  No  clear source of bacterial infection identified.  Will obtain lumbar puncture to rule out meningitis.  2. Seizure-patient had 2 episodes of seizure in the ED, she does have history of seizure disorder and takes Keppra 1.5 g twice a day at home.  Infectious process as above has likely lowered the seizure threshold causing seizures.  Also concern for meningitis.  Patient is empirically on vancomycin and cefepime.  Will obtain lumbar puncture as above.  Continue Lamictal 100 mg p.o. daily, Keppra 1.5 g twice a day.  3. Influenza A-influenza PCR positive for influenza A, started on Tamiflu 30 mg twice a day.  4. Hypokalemia-potassium is 3.2, will replace potassium and check BMP in a.m.  5. Diarrhea-patient had 2 loose stools in the ED, will obtain C. difficile PCR.  Continue IV normal saline at 100 mL/h.  6. Hypothyroidism-patient is status post thyroidectomy for thyroid carcinoma continue Synthroid.  TSH is 0.018.  Consider repeating TSH during his hospitalization, could be  sick euthyroid syndrome.  Will continue home dose of Synthroid.   7. Hypertension-blood pressure is low in the ED, will hold benazepril, HCTZ.   DVT Prophylaxis-    SCDs   AM Labs Ordered, also please review Full Orders  Family Communication: No family at bedside  Code Status: Presumed full code  Admission status: Inpatient: Based on patients clinical presentation and evaluation of above clinical data, I have made determination that patient meets Inpatient criteria at this time.  Patient presenting with influenza, seizures, diarrhea, sepsis.  Time spent in minutes : 60 minutes   Oswald Hillock M.D on 04/03/2018 at 2:13 AM

## 2018-04-03 NOTE — ED Notes (Signed)
Pt waiting for room in waiting room. Pt had witness grand mal seizure in waiting room. Witness kept patient from falling into floor. Pt airway assisted to maintain patency. Pt postictal for approxmiately 5-6 minutes. This RN blocked patient from sliding into floor as other staff arrived. Pt placed on stretcher and roomed.

## 2018-04-04 DIAGNOSIS — E872 Acidosis: Secondary | ICD-10-CM

## 2018-04-04 DIAGNOSIS — A413 Sepsis due to Hemophilus influenzae: Secondary | ICD-10-CM

## 2018-04-04 DIAGNOSIS — E785 Hyperlipidemia, unspecified: Secondary | ICD-10-CM

## 2018-04-04 DIAGNOSIS — K219 Gastro-esophageal reflux disease without esophagitis: Secondary | ICD-10-CM

## 2018-04-04 DIAGNOSIS — R6520 Severe sepsis without septic shock: Secondary | ICD-10-CM

## 2018-04-04 DIAGNOSIS — N179 Acute kidney failure, unspecified: Secondary | ICD-10-CM

## 2018-04-04 DIAGNOSIS — I10 Essential (primary) hypertension: Secondary | ICD-10-CM

## 2018-04-04 LAB — BASIC METABOLIC PANEL WITH GFR
Anion gap: 9 (ref 5–15)
BUN: 24 mg/dL — ABNORMAL HIGH (ref 8–23)
CO2: 20 mmol/L — ABNORMAL LOW (ref 22–32)
Calcium: 8.6 mg/dL — ABNORMAL LOW (ref 8.9–10.3)
Chloride: 109 mmol/L (ref 98–111)
Creatinine, Ser: 1.79 mg/dL — ABNORMAL HIGH (ref 0.44–1.00)
GFR calc Af Amer: 34 mL/min — ABNORMAL LOW
GFR calc non Af Amer: 30 mL/min — ABNORMAL LOW
Glucose, Bld: 88 mg/dL (ref 70–99)
Potassium: 3.6 mmol/L (ref 3.5–5.1)
Sodium: 138 mmol/L (ref 135–145)

## 2018-04-04 LAB — CBC
HCT: 29.9 % — ABNORMAL LOW (ref 36.0–46.0)
Hemoglobin: 9 g/dL — ABNORMAL LOW (ref 12.0–15.0)
MCH: 26.5 pg (ref 26.0–34.0)
MCHC: 30.1 g/dL (ref 30.0–36.0)
MCV: 87.9 fL (ref 80.0–100.0)
Platelets: 129 K/uL — ABNORMAL LOW (ref 150–400)
RBC: 3.4 MIL/uL — ABNORMAL LOW (ref 3.87–5.11)
RDW: 14.9 % (ref 11.5–15.5)
WBC: 3.7 K/uL — ABNORMAL LOW (ref 4.0–10.5)
nRBC: 0 % (ref 0.0–0.2)

## 2018-04-04 LAB — HIV ANTIBODY (ROUTINE TESTING W REFLEX): HIV Screen 4th Generation wRfx: NONREACTIVE

## 2018-04-04 LAB — MRSA PCR SCREENING: MRSA by PCR: NEGATIVE

## 2018-04-04 LAB — LACTIC ACID, PLASMA: Lactic Acid, Venous: 1 mmol/L (ref 0.5–1.9)

## 2018-04-04 MED ORDER — VANCOMYCIN HCL 1000 MG IV SOLR
INTRAVENOUS | Status: AC
  Filled 2018-04-04: qty 1000

## 2018-04-04 MED ORDER — VANCOMYCIN HCL 500 MG IV SOLR
INTRAVENOUS | Status: AC
  Filled 2018-04-04: qty 500

## 2018-04-04 NOTE — Progress Notes (Signed)
PROGRESS NOTE    [name]  UKG:[id number] DOB: 17-Jan-1956 DOA: 04/02/2018 PCP: [name], MD     Brief Narrative:  63 y.o. female, with history of hypertension, seizure disorder, mild mental retardation, obesity, thyroid cancer, depression came to ED with complaints of fever.  Patient initial complaints were flulike symptoms due to body aches, fever, sore throat.  Patient experience 2 episodes of generalized tonic-clonic seizure with subsequent postictal status.  Elevated lactic acid and positive influenza appreciated.  No other source of infection appreciated.   Assessment & Plan: 1-sepsis [facility]): Due to influenza A infection -Sepsis criteria resolved now -Continue Tamiflu -Continue IV fluids -Continue supportive care. -Patient negative MRSA by PCR and no other source of infection; lactic acid back to normal after fluid resuscitation's.  Will discontinue all empiric broad-spectrum antibiotics and monitor patient.  2-seizures -With concern of focal absence seizures today -Will check EEG and consult neurology -Continue Lamictal and Keppra  3-Hypokalemia In the setting of GI losses and poor oral intake -Continue repletion and follow electrolytes trend -Magnesium level within normal limits.  4-hypertension -Blood pressure stable without use of antihypertensive drugs -Continue holding home medications and follow blood signs.  5-hypothyroidism -Patient is status post thyroidectomy for thyroid carcinoma -Continue Synthroid  6-morbid obesity -Body mass index is 43.51 kg/m. -Low calorie diet, patient control and increase physical activity discussed with patient.  7-HLD -continue statins  8-GERD -continue PPI  9-acute kidney injury on CKD stage 3 -Continue minimizing nephrotoxic agents -Continue IV fluids -Follow creatinine trend. -Creatinine essentially back to baseline  DVT prophylaxis: SCDs Code Status: Full code Family Communication: No family at  bedside. Disposition Plan: Watch closely for the next 24 hours to catch any further seizure activity.  Continue IV fluids, monitor after stopping antibiotics and continue as needed antipyretics and supportive care.  Consultants:   Neurology  Procedures:   EEG  Antimicrobials:  Anti-infectives (From admission, onward)   Start     Dose/Rate Route Frequency Ordered Stop   04/04/18 0000  vancomycin (VANCOCIN) 1,250 mg in sodium chloride 0.9 % 250 mL IVPB  Status:  Discontinued     1,250 mg 166.7 mL/hr over 90 Minutes Intravenous Every 24 hours 04/03/18 0919 04/04/18 0756   04/03/18 2300  ceFEPIme (MAXIPIME) 2 g in sodium chloride 0.9 % 100 mL IVPB  Status:  Discontinued     2 g 200 mL/hr over 30 Minutes Intravenous Every 24 hours 04/03/18 0919 04/04/18 0756   04/03/18 0200  oseltamivir (TAMIFLU) capsule 30 mg     30 mg Oral 2 times daily 04/03/18 0149 04/07/18 2159   04/02/18 2300  vancomycin (VANCOCIN) 2,000 mg in sodium chloride 0.9 % 500 mL IVPB     2,000 mg 250 mL/hr over 120 Minutes Intravenous  Once 04/02/18 2252 04/03/18 0404   04/02/18 2230  ceFEPIme (MAXIPIME) 2 g in sodium chloride 0.9 % 100 mL IVPB     2 g 200 mL/hr over 30 Minutes Intravenous  Once 04/02/18 2228 04/02/18 2343   04/02/18 2230  metroNIDAZOLE (FLAGYL) IVPB 500 mg  Status:  Discontinued     500 mg 100 mL/hr over 60 Minutes Intravenous Every 8 hours 04/02/18 2228 04/04/18 0756   04/02/18 2230  vancomycin (VANCOCIN) IVPB 1000 mg/200 mL premix  Status:  Discontinued     1,000 mg 200 mL/hr over 60 Minutes Intravenous  Once 04/02/18 2228 04/02/18 2252       Subjective: Oriented x3, afebrile during my examination and no complaining  of chest pain or shortness of breath.  Patient reports still having generalized myalgia and also her records demonstrated low-grade temperature overnight.  Objective: Vitals:   04/04/18 1025 04/04/18 1100 04/04/18 1117 04/04/18 1151  BP:    116/84  Pulse:  77  85  Resp:  20  (!)  22  Temp:   98.3 F (36.8 C)   TempSrc:   Oral   SpO2: 96% 96%  99%  Weight:      Height:        Intake/Output Summary (Last 24 hours) at 04/04/2018 1551 Last data filed at 04/04/2018 1300 Gross per 24 hour  Intake 3294.7 ml  Output 1251 ml  Net 2043.7 ml   Filed Weights   04/02/18 2022 04/03/18 1705 04/04/18 0500  Weight: 112.9 kg 115.1 kg 118.6 kg    Examination: General exam: Alert, awake, oriented x 3; in no major distress.  Complaining of myalgia.  No nausea, no vomiting, no abdominal pain. Respiratory system: Clear to auscultation. Respiratory effort normal. Cardiovascular system:RRR. No murmurs, rubs, gallops. Gastrointestinal system: Abdomen is obese, nondistended, soft and nontender. No organomegaly or masses felt. Normal bowel sounds heard. Central nervous system: Alert and oriented. No focal neurological deficits. Extremities: No cyanosis or clubbing. Skin: No rashes, lesions or ulcers Psychiatry: Judgement and insight appear normal. Mood & affect appropriate.     Data Reviewed: I have personally reviewed following labs and imaging studies  CBC: Recent Labs  Lab 04/02/18 2250 04/03/18 0552 04/04/18 0413  WBC 6.3 5.5 3.7*  NEUTROABS 4.9  --   --   HGB 10.2* 9.2* 9.0*  HCT 32.8* 29.0* 29.9*  MCV 85.0 85.0 87.9  PLT 188 154 093*   Basic Metabolic Panel: Recent Labs  Lab 04/02/18 2250 04/03/18 0552 04/04/18 0413  NA 138 139 138  K 3.2* 3.9 3.6  CL 105 112* 109  CO2 21* 21* 20*  GLUCOSE 121* 114* 88  BUN 38* 36* 24*  CREATININE 2.17* 1.79* 1.79*  CALCIUM 9.4 8.5* 8.6*   GFR: Estimated Creatinine Clearance: 41.4 mL/min (A) (by C-G formula based on SCr of 1.79 mg/dL (H)).   Liver Function Tests: Recent Labs  Lab 04/02/18 2250 04/03/18 0552  AST 27 21  ALT 17 15  ALKPHOS 94 82  BILITOT 0.6 0.5  PROT 8.0 7.1  ALBUMIN 3.9 3.2*   CBG: Recent Labs  Lab 04/02/18 2201  GLUCAP 138*   Thyroid Function Tests: Recent Labs    04/02/18 2250    TSH 0.018*   Urine analysis:    Component Value Date/Time   COLORURINE YELLOW 04/03/2018 0856   APPEARANCEUR CLEAR 04/03/2018 0856   LABSPEC 1.014 04/03/2018 0856   PHURINE 5.0 04/03/2018 0856   GLUCOSEU NEGATIVE 04/03/2018 0856   HGBUR MODERATE (A) 04/03/2018 0856   HGBUR small 04/10/2009 0845   BILIRUBINUR NEGATIVE 04/03/2018 0856   BILIRUBINUR neg 04/10/2017 1135   KETONESUR NEGATIVE 04/03/2018 0856   PROTEINUR NEGATIVE 04/03/2018 0856   UROBILINOGEN 0.2 04/10/2017 1135   UROBILINOGEN 0.2 04/10/2009 0845   NITRITE NEGATIVE 04/03/2018 0856   LEUKOCYTESUR SMALL (A) 04/03/2018 0856    Recent Results (from the past 240 hour(s))  Blood Culture (routine x 2)     Status: None (Preliminary result)   Collection Time: 04/02/18 10:50 PM  Result Value Ref Range Status   Specimen Description RIGHT ANTECUBITAL  Final   Special Requests   Final    BOTTLES DRAWN AEROBIC AND ANAEROBIC Blood Culture results may not be  optimal due to an excessive volume of blood received in culture bottles   Culture   Final    NO GROWTH 2 DAYS Performed at [facility], [address], [place], [place] [address]    Report Status PENDING  Incomplete  Blood Culture (routine x 2)     Status: None (Preliminary result)   Collection Time: 04/02/18 11:27 PM  Result Value Ref Range Status   Specimen Description BLOOD LEFT HAND  Final   Special Requests   Final    BOTTLES DRAWN AEROBIC ONLY Blood Culture adequate volume   Culture   Final    NO GROWTH 1 DAY Performed at [facility], [address] [place], [place] [address]    Report Status PENDING  Incomplete  MRSA PCR Screening     Status: None   Collection Time: 04/03/18  5:38 PM  Result Value Ref Range Status   MRSA by PCR NEGATIVE NEGATIVE Final    Comment:        The GeneXpert MRSA Assay (FDA approved for NASAL specimens only), is one component of a comprehensive MRSA colonization surveillance program. It is not intended to diagnose  MRSA infection nor to guide or monitor treatment for MRSA infections. Performed at [facility], [address], [place], [place] [address]      Radiology Studies: Ct Head Wo Contrast  Result Date: 04/03/2018 CLINICAL DATA:  Altered level of consciousness. Code sepsis. EXAM: CT HEAD WITHOUT CONTRAST TECHNIQUE: Contiguous axial images were obtained from the base of the skull through the vertex without intravenous contrast. COMPARISON:  None. FINDINGS: Brain: Prominent cerebellar atrophy. Relatively preserved supratentorial cerebral volume. No evidence of parenchymal hemorrhage or extra-axial fluid collection. No mass lesion, mass effect, or midline shift. No CT evidence of acute infarction. No ventriculomegaly. Vascular: No acute abnormality. Skull: No evidence of calvarial fracture. Sinuses/Orbits: The visualized paranasal sinuses are essentially clear. Other:  The mastoid air cells are unopacified. IMPRESSION: 1. No evidence of acute intracranial abnormality. 2. Prominent cerebellar atrophy. Electronically Signed   By: Ilona Sorrel M.D.   On: 04/03/2018 03:30   Dg Chest Port 1 View  Result Date: 04/02/2018 CLINICAL DATA:  63 year old female with productive cough, fever, body ache, sore throat. EXAM: PORTABLE CHEST 1 VIEW COMPARISON:  Chest radiographs 11/17/2012. FINDINGS: Portable AP upright view at 2254 hours. Chronic staple line in the right upper lobe. Lung volumes and mediastinal contours have not significantly changed since 2014. Visualized tracheal air column is within normal limits. No pneumothorax or pulmonary edema. No pleural effusion or consolidation. Mild basilar predominant increased interstitial markings appear stable allowing for portable technique. Paucity of bowel gas in the upper abdomen. No acute osseous abnormality identified. IMPRESSION: Stable since 2014 when allowing for portable technique. No acute cardiopulmonary abnormality. Electronically Signed   By: Genevie Ann M.D.   On:  04/02/2018 23:10    Scheduled Meds: . lamoTRIgine  100 mg Oral Daily  . levETIRAcetam  1,500 mg Oral BID  . levothyroxine  137 mcg Oral QAC breakfast  . oseltamivir  30 mg Oral BID  . pantoprazole (PROTONIX) IV  40 mg Intravenous Q24H  . rosuvastatin  40 mg Oral Daily   Continuous Infusions: . sodium chloride 100 mL/hr at 04/04/18 0624  . sodium chloride       LOS: 1 day    Time spent: 30 minutes.    Barton Dubois, MD [facility] Pager [phone]  04/04/2018, 3:51 PM

## 2018-04-05 ENCOUNTER — Inpatient Hospital Stay (HOSPITAL_COMMUNITY)
Admit: 2018-04-05 | Discharge: 2018-04-05 | Disposition: A | Discharge status: Home / Self Care | Payer: Medicaid Other | Attending: Internal Medicine | Admitting: Internal Medicine

## 2018-04-05 LAB — MAGNESIUM: Magnesium: 1.6 mg/dL — ABNORMAL LOW (ref 1.7–2.4)

## 2018-04-05 LAB — BASIC METABOLIC PANEL
Anion gap: 7 (ref 5–15)
BUN: 19 mg/dL (ref 8–23)
CO2: 21 mmol/L — ABNORMAL LOW (ref 22–32)
Calcium: 8.7 mg/dL — ABNORMAL LOW (ref 8.9–10.3)
Chloride: 111 mmol/L (ref 98–111)
Creatinine, Ser: 1.5 mg/dL — ABNORMAL HIGH (ref 0.44–1.00)
GFR calc Af Amer: 42 mL/min — ABNORMAL LOW (ref >60)
GFR calc non Af Amer: 36 mL/min — ABNORMAL LOW (ref >60)
Glucose, Bld: 92 mg/dL (ref 70–99)
Potassium: 3.5 mmol/L (ref 3.5–5.1)
Sodium: 139 mmol/L (ref 135–145)

## 2018-04-05 LAB — CBC
HCT: 28.9 % — ABNORMAL LOW (ref 36.0–46.0)
Hemoglobin: 8.9 g/dL — ABNORMAL LOW (ref 12.0–15.0)
MCH: 26.7 pg (ref 26.0–34.0)
MCHC: 30.8 g/dL (ref 30.0–36.0)
MCV: 86.8 fL (ref 80.0–100.0)
Platelets: 136 10*3/uL — ABNORMAL LOW (ref 150–400)
RBC: 3.33 MIL/uL — ABNORMAL LOW (ref 3.87–5.11)
RDW: 14.3 % (ref 11.5–15.5)
WBC: 3 10*3/uL — ABNORMAL LOW (ref 4.0–10.5)
nRBC: 0 % (ref 0.0–0.2)

## 2018-04-05 MED ORDER — PANTOPRAZOLE SODIUM 40 MG PO TBEC
40.0000 mg | DELAYED_RELEASE_TABLET | Freq: Every day | ORAL | Status: DC
  Administered 2018-04-06: 40 mg via ORAL
  Filled 2018-04-05: qty 1

## 2018-04-05 NOTE — Progress Notes (Signed)
EEG completed, results pending. 

## 2018-04-05 NOTE — Procedures (Signed)
History: 63 year old female being evaluated for possible absence seizure's  Sedation: None  Technique: This is a 21 channel routine scalp EEG performed at the bedside with bipolar and monopolar montages arranged in accordance to the international 10/20 system of electrode placement. One channel was dedicated to EKG recording.    Background: The background consists of intermixed alpha and beta activities. There is a well defined posterior dominant rhythm of 10 hz that attenuates with eye opening.  Sleep and drowsiness are not recorded  Photic stimulation: Physiologic driving is notperformed  EEG Abnormalities: None  Clinical Interpretation: This normal EEG is recorded in the waking state. There was no seizure or seizure predisposition recorded on this study. Please note that lack of epileptiform activity on EEG does not preclude the possibility of epilepsy.   Christine Rack, MD [facility] [phone]  If 7pm- 7am, please page neurology on call as listed in [place].

## 2018-04-05 NOTE — Progress Notes (Signed)
PROGRESS NOTE    [name]  JXB:[id number] DOB: [birth year] DOA: 04/02/2018 PCP: [name], MD     Brief Narrative:  63 y.o. female, with history of hypertension, seizure disorder, mild mental retardation, obesity, thyroid cancer, depression came to ED with complaints of fever.  Patient initial complaints were flulike symptoms due to body aches, fever, sore throat.  Patient experience 2 episodes of generalized tonic-clonic seizure with subsequent postictal status.  Elevated lactic acid and positive influenza appreciated.  No other source of infection appreciated.   Assessment & Plan: 1-sepsis [facility]): Due to influenza A infection -Sepsis criteria resolved now -Continue Tamiflu -Continue IV fluids -Continue supportive care. -Patient with negative MRSA by PCR and no other source of infection; lactic acid back to normal after fluid resuscitation's.   -Will continue monitoring off abx's.  2-seizures -With concern of focal absence seizures today -Will follow results of EEG and neurology recommendations -Continue Lamictal and Keppra at current dose.  3-Hypokalemia -In the setting of GI losses and poor oral intake -Continue repletion as needed and follow electrolytes trend -Magnesium level within normal limits.  4-hypertension -Blood pressure stable without use of antihypertensive drugs -Continue holding home medications and follow blood signs.  5-hypothyroidism -Patient is status post thyroidectomy for thyroid carcinoma -Continue Synthroid  6-morbid obesity -Body mass index is 43.33 kg/m. -Low calorie diet, patient control and increase physical activity discussed with patient.  7-HLD -continue statins  8-GERD -continue PPI  9-acute kidney injury on CKD stage 3 -Continue minimizing nephrotoxic agents -Continue IV fluids; but adjust rate -Follow creatinine trend intermittently. -Creatinine essentially back to baseline -Creatinine 1.5 currently  DVT  prophylaxis: SCDs Code Status: Full code Family Communication: No family at bedside. Disposition Plan: Transfer patient to telemetry bed.  Continue fluid resuscitation, continue seizure precautions.  Follow EEG and neurology recommendations.  Continue Tamiflu and supportive care.    Consultants:   Neurology  Procedures:   EEG: Results pending  Antimicrobials:  Anti-infectives (From admission, onward)   Start     Dose/Rate Route Frequency Ordered Stop   04/04/18 0000  vancomycin (VANCOCIN) 1,250 mg in sodium chloride 0.9 % 250 mL IVPB  Status:  Discontinued     1,250 mg 166.7 mL/hr over 90 Minutes Intravenous Every 24 hours 04/03/18 0919 04/04/18 0756   04/03/18 2300  ceFEPIme (MAXIPIME) 2 g in sodium chloride 0.9 % 100 mL IVPB  Status:  Discontinued     2 g 200 mL/hr over 30 Minutes Intravenous Every 24 hours 04/03/18 0919 04/04/18 0756   04/03/18 0200  oseltamivir (TAMIFLU) capsule 30 mg     30 mg Oral 2 times daily 04/03/18 0149 04/07/18 2159   04/02/18 2300  vancomycin (VANCOCIN) 2,000 mg in sodium chloride 0.9 % 500 mL IVPB     2,000 mg 250 mL/hr over 120 Minutes Intravenous  Once 04/02/18 2252 04/03/18 0404   04/02/18 2230  ceFEPIme (MAXIPIME) 2 g in sodium chloride 0.9 % 100 mL IVPB     2 g 200 mL/hr over 30 Minutes Intravenous  Once 04/02/18 2228 04/02/18 2343   04/02/18 2230  metroNIDAZOLE (FLAGYL) IVPB 500 mg  Status:  Discontinued     500 mg 100 mL/hr over 60 Minutes Intravenous Every 8 hours 04/02/18 2228 04/04/18 0756   04/02/18 2230  vancomycin (VANCOCIN) IVPB 1000 mg/200 mL premix  Status:  Discontinued     1,000 mg 200 mL/hr over 60 Minutes Intravenous  Once 04/02/18 2228 04/02/18 2252  Subjective: Low-grade temperature overnight.  No chest pain, no nausea, no vomiting.  Patient reported mild headache this morning.  No recollection about seizure activity yesterday.  Objective: Vitals:   04/05/18 1100 04/05/18 1114 04/05/18 1200 04/05/18 1300  BP:    112/74   Pulse: 72 64 76 70  Resp: (!) 22 16 16 16   Temp:  99.4 F (37.4 C)    TempSrc:  Oral    SpO2: 97% 98% 98% 97%  Weight:      Height:        Intake/Output Summary (Last 24 hours) at 04/05/2018 1535 Last data filed at 04/05/2018 1337 Gross per 24 hour  Intake 2358.36 ml  Output 1400 ml  Net 958.36 ml   Filed Weights   04/03/18 1705 04/04/18 0500 04/05/18 0520  Weight: 115.1 kg 118.6 kg 118.1 kg    Examination: General exam: Alert, awake, oriented x 3; no further seizures reported.  Patient had no recollection about episode described just today.  Denies chest pain, no shortness of breath, no nausea, no vomiting.  Expressed having mild headache this morning.  Low-grade temperature overnight. Respiratory system: Clear to auscultation. Respiratory effort normal. Cardiovascular system:RRR. No murmurs, rubs, gallops. Gastrointestinal system: Abdomen is obese, nondistended, soft and nontender. No organomegaly or masses felt. Normal bowel sounds heard. Central nervous system: Alert and oriented. No focal neurological deficits. Extremities: No C/C/E, +pedal pulses Skin: No rashes, lesions or ulcers Psychiatry: Judgement and insight appear normal. Mood & affect appropriate.    Data Reviewed: I have personally reviewed following labs and imaging studies  CBC: Recent Labs  Lab 04/02/18 2250 04/03/18 0552 04/04/18 0413 04/05/18 0610  WBC 6.3 5.5 3.7* 3.0*  NEUTROABS 4.9  --   --   --   HGB 10.2* 9.2* 9.0* 8.9*  HCT 32.8* 29.0* 29.9* 28.9*  MCV 85.0 85.0 87.9 86.8  PLT 188 154 129* 371*   Basic Metabolic Panel: Recent Labs  Lab 04/02/18 2250 04/03/18 0552 04/04/18 0413 04/05/18 0610  NA 138 139 138 139  K 3.2* 3.9 3.6 3.5  CL 105 112* 109 111  CO2 21* 21* 20* 21*  GLUCOSE 121* 114* 88 92  BUN 38* 36* 24* 19  CREATININE 2.17* 1.79* 1.79* 1.50*  CALCIUM 9.4 8.5* 8.6* 8.7*  MG  --   --   --  1.6*   GFR: Estimated Creatinine Clearance: 48.7 mL/min (A) (by C-G  formula based on SCr of 1.5 mg/dL (H)).   Liver Function Tests: Recent Labs  Lab 04/02/18 2250 04/03/18 0552  AST 27 21  ALT 17 15  ALKPHOS 94 82  BILITOT 0.6 0.5  PROT 8.0 7.1  ALBUMIN 3.9 3.2*   CBG: Recent Labs  Lab 04/02/18 2201  GLUCAP 138*   Thyroid Function Tests: Recent Labs    04/02/18 2250  TSH 0.018*   Urine analysis:    Component Value Date/Time   COLORURINE YELLOW 04/03/2018 0856   APPEARANCEUR CLEAR 04/03/2018 0856   LABSPEC 1.014 04/03/2018 0856   PHURINE 5.0 04/03/2018 0856   GLUCOSEU NEGATIVE 04/03/2018 0856   HGBUR MODERATE (A) 04/03/2018 0856   HGBUR small 04/10/2009 0845   BILIRUBINUR NEGATIVE 04/03/2018 0856   BILIRUBINUR neg 04/10/2017 1135   KETONESUR NEGATIVE 04/03/2018 0856   PROTEINUR NEGATIVE 04/03/2018 0856   UROBILINOGEN 0.2 04/10/2017 1135   UROBILINOGEN 0.2 04/10/2009 0845   NITRITE NEGATIVE 04/03/2018 0856   LEUKOCYTESUR SMALL (A) 04/03/2018 0856    Recent Results (from the past 240 hour(s))  Blood Culture (routine x 2)     Status: None (Preliminary result)   Collection Time: 04/02/18 10:50 PM  Result Value Ref Range Status   Specimen Description RIGHT ANTECUBITAL  Final   Special Requests   Final    BOTTLES DRAWN AEROBIC AND ANAEROBIC Blood Culture results may not be optimal due to an excessive volume of blood received in culture bottles   Culture   Final    NO GROWTH 3 DAYS Performed at [facility], [address], [place], [place] [address]    Report Status PENDING  Incomplete  Blood Culture (routine x 2)     Status: None (Preliminary result)   Collection Time: 04/02/18 11:27 PM  Result Value Ref Range Status   Specimen Description BLOOD LEFT HAND  Final   Special Requests   Final    BOTTLES DRAWN AEROBIC ONLY Blood Culture adequate volume   Culture   Final    NO GROWTH 2 DAYS Performed at [facility], [address], [place], [place] [address]    Report Status PENDING  Incomplete  MRSA PCR Screening     Status:  None   Collection Time: 04/03/18  5:38 PM  Result Value Ref Range Status   MRSA by PCR NEGATIVE NEGATIVE Final    Comment:        The GeneXpert MRSA Assay (FDA approved for NASAL specimens only), is one component of a comprehensive MRSA colonization surveillance program. It is not intended to diagnose MRSA infection nor to guide or monitor treatment for MRSA infections. Performed at [facility], [address] [place], [place] [address]      Radiology Studies: No results found.  Scheduled Meds: . lamoTRIgine  100 mg Oral Daily  . levETIRAcetam  1,500 mg Oral BID  . levothyroxine  137 mcg Oral QAC breakfast  . oseltamivir  30 mg Oral BID  . [START ON 04/06/2018] pantoprazole  40 mg Oral Daily  . rosuvastatin  40 mg Oral Daily   Continuous Infusions: . sodium chloride 100 mL/hr at 04/05/18 1337  . sodium chloride       LOS: 2 days    Time spent: 30 minutes.    Barton Dubois, MD [facility] Pager [phone]  04/05/2018, 3:35 PM

## 2018-04-06 DIAGNOSIS — J101 Influenza due to other identified influenza virus with other respiratory manifestations: Secondary | ICD-10-CM

## 2018-04-06 DIAGNOSIS — N183 Chronic kidney disease, stage 3 (moderate): Secondary | ICD-10-CM

## 2018-04-06 DIAGNOSIS — N179 Acute kidney failure, unspecified: Secondary | ICD-10-CM

## 2018-04-06 DIAGNOSIS — G40909 Epilepsy, unspecified, not intractable, without status epilepticus: Secondary | ICD-10-CM

## 2018-04-06 MED ORDER — OSELTAMIVIR PHOSPHATE 30 MG PO CAPS: 30.0000 mg | ORAL_CAPSULE | Freq: Two times a day (BID) | ORAL | 0 refills | Status: AC

## 2018-04-06 MED ORDER — HYDROCHLOROTHIAZIDE 25 MG PO TABS: 12.5000 mg | ORAL_TABLET | Freq: Every day | ORAL | Status: DC

## 2018-04-06 MED ORDER — LAMOTRIGINE 100 MG PO TABS: 100.0000 mg | ORAL_TABLET | Freq: Two times a day (BID) | ORAL | 3 refills | Status: DC

## 2018-04-06 NOTE — Discharge Summary (Signed)
Physician Discharge Summary  CARY WILFORD ZOX:[id number] DOB: 04-Nov-1955 DOA: 04/02/2018  PCP: [name], MD  Admit date: 04/02/2018 Discharge date: 04/06/2018  Time spent: 35 minutes  Recommendations for Outpatient Follow-up:  1. Repeat basic metabolic panel to follow electrolytes and renal function 2. Reassess blood pressure and further adjust antihypertensive regimen   Discharge Diagnoses:  Active Problems:   Obesity, Class III, BMI 40-49.9 (morbid obesity) (HCC)   Seizure disorder (HCC)   Sepsis (HCC)   Influenza A   Acute renal failure superimposed on stage 3 chronic kidney disease ([place])   Discharge Condition: Stable and improved.  Patient discharged home with instruction to follow-up with PCP in 10 days and with neurology in 3 weeks.  Diet recommendation: Heart healthy diet and low calorie diet.  Filed Weights   04/04/18 0500 04/05/18 0520 04/06/18 0500  Weight: 118.6 kg 118.1 kg 119.4 kg    History of present illness:  As per H&P written by Dr. Darrick Meigs on 04/03/2018 63 y.o.female,with history of hypertension, seizure disorder, mild mental retardation, obesity, thyroid cancer, depression came to ED with complaints of fever. Patient initial complaints were flulike symptoms due to body aches, fever, sore throat.  Patient experience 2 episodes of generalized tonic-clonic seizure with subsequent postictal status.  Elevated lactic acid and positive influenza appreciated.  No other source of infection appreciated.  Hospital Course:  1-sepsis [facility]): Due to influenza A infection -Sepsis criteria resolved now -Continue Tamiflu for 2 more days to complete therapy -Advised to keep herself well-hydrated and to use Tylenol as needed for supportive care and comfort. -Patient with negative MRSA by PCR and no other source of infection; lactic acid back to normal after fluid resuscitation's.    2-seizures -With concern of focal absence seizures on 04/03/2018 -No EEG findings for  acute epilepsy -Follow neurology recommendations Lamictal dose was adjusted and continue Keppra 750 mg twice a day -Outpatient follow-up in 3 weeks.    3-Hypokalemia -In the setting of GI losses and poor oral intake -Continue repletion as needed and follow electrolytes trend -Magnesium level within normal limits.  4-hypertension -Blood pressure stable and rising at discharge -Will resume the use of benazepril -Will continue recovering from influenza a CTC will be held for 3 more days and then resume at just half dose. -Reassess blood pressure at follow-up visit and further adjust antihypertensive regimen as needed.  5-hypothyroidism -Patient is status post thyroidectomy for thyroid carcinoma -Continue Synthroid  6-morbid obesity -Body mass index is 43.80 kg/m. -Low calorie diet, patient control and increase physical activity discussed with patient.  7-HLD -continue statins  8-GERD -continue PPI  9-acute kidney injury on CKD stage 3 -Continue minimizing nephrotoxic agents (half dose of HCTZ has been prescribed at the moment of discharge).  Safe to resume home benazepril. -Patient advised to keep herself well-hydrated -Follow basic metabolic panel follow-up visit to reassess creatinine trend. -Creatinine essentially back to baseline at time of discharge with a level of 1.5.   Procedures:  See below for x-ray reports  EEG: There was no signs of acute epileptic waves appreciated.  Consultations:  Neurology (discussed with Dr. Merlene Laughter)  Discharge Exam: Vitals:   04/06/18 0800 04/06/18 1123  BP:    Pulse: 75   Resp: 16   Temp:  97.8 F (36.6 C)  SpO2: 99%    General exam: Alert, awake, oriented x 3; no further seizures reported.  Patient denies chest pain, shortness of breath, nausea and vomiting.    No headaches, blurred  visions or focal weaknesses reported. Respiratory system: Clear to auscultation. Respiratory effort normal. Cardiovascular system:RRR.  No murmurs, rubs, gallops. Gastrointestinal system: Abdomen is obese, nondistended, soft and nontender. No organomegaly or masses felt. Normal bowel sounds heard. Central nervous system: Alert and oriented. No focal neurological deficits. Extremities: No C/C/E, +pedal pulses Skin: No rashes, lesions or ulcers Psychiatry: Judgement and insight appear normal. Mood & affect appropriate.   Discharge Instructions   Discharge Instructions    Diet - low sodium heart healthy   Complete by:  As directed    Discharge instructions   Complete by:  As directed    Take medications as prescribed Maintain adequate hydration Arrange follow-up with PCP in 10 days Outpatient follow-up with neurology service.     Allergies as of 04/06/2018      Reactions   Simvastatin Other (See Comments)   Muscle aches      Medication List    TAKE these medications   acetaminophen 500 MG tablet Commonly known as:  TYLENOL Take 1 tablet (500 mg total) by mouth 2 (two) times daily.   benazepril 40 MG tablet Commonly known as:  LOTENSIN TAKE (1) TABLET BY MOUTH DAILY. What changed:  See the new instructions.   calcitRIOL 0.25 MCG capsule Commonly known as:  ROCALTROL Take 0.25 mcg by mouth every Monday, Wednesday, and Friday. \   hydrochlorothiazide 25 MG tablet Commonly known as:  HYDRODIURIL Take 0.5 tablets (12.5 mg total) by mouth daily. Start taking on:  April 10, 2018 What changed:    how much to take  These instructions start on April 10, 2018. If you are unsure what to do until then, ask your doctor or other care provider.   lamoTRIgine 100 MG tablet Commonly known as:  LAMICTAL Take 1 tablet (100 mg total) by mouth 2 (two) times daily. What changed:  when to take this   levETIRAcetam 750 MG tablet Commonly known as:  KEPPRA Two tablets twice daily What changed:    how much to take  how to take this  when to take this  additional instructions   levothyroxine 137 MCG  tablet Commonly known as:  SYNTHROID, LEVOTHROID TAKE 1 TABLET BY MOUTH DAILY BEFORE BREAKFAST. What changed:    how much to take  how to take this  when to take this  additional instructions   loratadine 10 MG tablet Commonly known as:  CLARITIN TAKE ONE TABLET BY MOUTH ONCE DAILY.   omeprazole 20 MG capsule Commonly known as:  PRILOSEC TAKE 2 CAPSULES BY MOUTH ONCE DAILY.   oseltamivir 30 MG capsule Commonly known as:  TAMIFLU Take 1 capsule (30 mg total) by mouth 2 (two) times daily for 2 days.   rosuvastatin 40 MG tablet Commonly known as:  CRESTOR TAKE 1 TABLET BY MOUTH ONCE A DAY.      Allergies  Allergen Reactions  . Simvastatin Other (See Comments)    Muscle aches   Follow-up Information    [name], MD. Schedule an appointment as soon as possible for a visit in 10 day(s).   Specialty:  Family Medicine Contact information: [address] [place] [address] [phone]        Phillips Odor, MD. Schedule an appointment as soon as possible for a visit in 3 week(s).   Specialty:  Neurology Contact information: [address] [place] [address] [phone]           The results of significant diagnostics from  this hospitalization (including imaging, microbiology, ancillary and laboratory) are listed below for reference.    Significant Diagnostic Studies: Ct Head Wo Contrast  Result Date: 04/03/2018 CLINICAL DATA:  Altered level of consciousness. Code sepsis. EXAM: CT HEAD WITHOUT CONTRAST TECHNIQUE: Contiguous axial images were obtained from the base of the skull through the vertex without intravenous contrast. COMPARISON:  None. FINDINGS: Brain: Prominent cerebellar atrophy. Relatively preserved supratentorial cerebral volume. No evidence of parenchymal hemorrhage or extra-axial fluid collection. No mass lesion, mass effect, or midline shift. No CT evidence of acute infarction. No ventriculomegaly. Vascular: No  acute abnormality. Skull: No evidence of calvarial fracture. Sinuses/Orbits: The visualized paranasal sinuses are essentially clear. Other:  The mastoid air cells are unopacified. IMPRESSION: 1. No evidence of acute intracranial abnormality. 2. Prominent cerebellar atrophy. Electronically Signed   By: Ilona Sorrel M.D.   On: 04/03/2018 03:30   Dg Chest Port 1 View  Result Date: 04/02/2018 CLINICAL DATA:  63 year old female with productive cough, fever, body ache, sore throat. EXAM: PORTABLE CHEST 1 VIEW COMPARISON:  Chest radiographs 11/17/2012. FINDINGS: Portable AP upright view at 2254 hours. Chronic staple line in the right upper lobe. Lung volumes and mediastinal contours have not significantly changed since 2014. Visualized tracheal air column is within normal limits. No pneumothorax or pulmonary edema. No pleural effusion or consolidation. Mild basilar predominant increased interstitial markings appear stable allowing for portable technique. Paucity of bowel gas in the upper abdomen. No acute osseous abnormality identified. IMPRESSION: Stable since 2014 when allowing for portable technique. No acute cardiopulmonary abnormality. Electronically Signed   By: Genevie Ann M.D.   On: 04/02/2018 23:10   Microbiology: Recent Results (from the past 240 hour(s))  Blood Culture (routine x 2)     Status: None (Preliminary result)   Collection Time: 04/02/18 10:50 PM  Result Value Ref Range Status   Specimen Description RIGHT ANTECUBITAL  Final   Special Requests   Final    BOTTLES DRAWN AEROBIC AND ANAEROBIC Blood Culture results may not be optimal due to an excessive volume of blood received in culture bottles   Culture   Final    NO GROWTH 4 DAYS Performed at [facility], [address], [place], [place] [address]    Report Status PENDING  Incomplete  Blood Culture (routine x 2)     Status: None (Preliminary result)   Collection Time: 04/02/18 11:27 PM  Result Value Ref Range Status   Specimen  Description BLOOD LEFT HAND  Final   Special Requests   Final    BOTTLES DRAWN AEROBIC ONLY Blood Culture adequate volume   Culture   Final    NO GROWTH 3 DAYS Performed at [facility], [address] [place], [place] [address]    Report Status PENDING  Incomplete  MRSA PCR Screening     Status: None   Collection Time: 04/03/18  5:38 PM  Result Value Ref Range Status   MRSA by PCR NEGATIVE NEGATIVE Final    Comment:        The GeneXpert MRSA Assay (FDA approved for NASAL specimens only), is one component of a comprehensive MRSA colonization surveillance program. It is not intended to diagnose MRSA infection nor to guide or monitor treatment for MRSA infections. Performed at [facility], [address] [place],  [address]      Labs: Basic Metabolic Panel: Recent Labs  Lab 04/02/18 2250 04/03/18 0552 04/04/18 0413 04/05/18 0610  NA 138 139 138 139  K 3.2* 3.9  3.6 3.5  CL 105 112* 109 111  CO2 21* 21* 20* 21*  GLUCOSE 121* 114* 88 92  BUN 38* 36* 24* 19  CREATININE 2.17* 1.79* 1.79* 1.50*  CALCIUM 9.4 8.5* 8.6* 8.7*  MG  --   --   --  1.6*   Liver Function Tests: Recent Labs  Lab 04/02/18 2250 04/03/18 0552  AST 27 21  ALT 17 15  ALKPHOS 94 82  BILITOT 0.6 0.5  PROT 8.0 7.1  ALBUMIN 3.9 3.2*   CBC: Recent Labs  Lab 04/02/18 2250 04/03/18 0552 04/04/18 0413 04/05/18 0610  WBC 6.3 5.5 3.7* 3.0*  NEUTROABS 4.9  --   --   --   HGB 10.2* 9.2* 9.0* 8.9*  HCT 32.8* 29.0* 29.9* 28.9*  MCV 85.0 85.0 87.9 86.8  PLT 188 154 129* 136*    CBG: Recent Labs  Lab 04/02/18 2201  GLUCAP 138*    Signed:  Barton Dubois MD.  [facility] 04/06/2018, 2:41 PM

## 2018-04-06 NOTE — Progress Notes (Signed)
Being discharged to home. Granddaughter or daughter is coming to pick up. IV access removed. Waiting for clothes from home to get patient dress. All other medical devices removed.

## 2018-04-07 ENCOUNTER — Telehealth: Payer: Self-pay

## 2018-04-07 LAB — CULTURE, BLOOD (ROUTINE X 2): Culture: NO GROWTH

## 2018-04-07 NOTE — Telephone Encounter (Signed)
Transition Care Management Follow-up Telephone Call   Date discharged?    04-06-18            How have you been since you were released from the hospital? "i'm feeling better"    Do you understand why you were in the hospital? "I had the flu"   Do you understand the discharge instructions?"Yes "    Where were you discharged to? Home    Items Reviewed:  Medications reviewed: Yes   Allergies reviewed: Yes   Dietary changes reviewed: Low sodium heart healthy   Referrals reviewed: Yes , [facility] on 2-18 and Then I make an appointment with my kidney doctor    Functional Questionnaire:   Activities of Daily Living (ADLs):   I can do for myself but my daughter is here to help me   Any transportation issues/concerns?: " catch the bus"  Any patient concerns? None    Confirmed importance and date/time of follow-up visits scheduled Yes 04-13-18    Confirmed with patient if condition begins to worsen call PCP or go to the ER.  Patient was given the office number and encouraged to call back with question or concerns.  :  Yes

## 2018-04-08 ENCOUNTER — Ambulatory Visit (INDEPENDENT_AMBULATORY_CARE_PROVIDER_SITE_OTHER): Payer: Medicaid Other | Admitting: "Endocrinology

## 2018-04-08 ENCOUNTER — Encounter: Payer: Self-pay | Admitting: "Endocrinology

## 2018-04-08 VITALS — BP 143/80 | HR 84 | Ht 65.0 in | Wt 256.2 lb

## 2018-04-08 DIAGNOSIS — C73 Malignant neoplasm of thyroid gland: Secondary | ICD-10-CM

## 2018-04-08 DIAGNOSIS — E890 Postprocedural hypothyroidism: Secondary | ICD-10-CM

## 2018-04-08 LAB — CULTURE, BLOOD (ROUTINE X 2)
Culture: NO GROWTH
Special Requests: ADEQUATE

## 2018-04-08 MED ORDER — LEVOTHYROXINE SODIUM 137 MCG PO TABS: 137.0000 ug | ORAL_TABLET | Freq: Every day | ORAL | 6 refills | Status: DC

## 2018-04-08 NOTE — Progress Notes (Signed)
LeighAnn Kodah Maret, CMA  

## 2018-04-08 NOTE — Progress Notes (Signed)
Endocrinology follow-up note   Subjective:    Patient ID: [name], female    DOB: Apr 28, 1955,    Past Medical History:  Diagnosis Date  . Depression   . Fractures   . Hyperlipidemia   . Hypertension   . Mental retardation, mild (I.Q. 50-70)   . Obesity   . Seizure ([place])    11/17/2012 "still having them; "I believe their from a tumor in my head" ((11/17/2012  . Seizure disorder ([place])   . Thyroid cancer [facility])    Past Surgical History:  Procedure Laterality Date  . ABDOMINAL HYSTERECTOMY  2001  . BREAST LUMPECTOMY  1996   right   . COLONOSCOPY N/A 05/16/2016   Procedure: COLONOSCOPY;  Surgeon: Danie Binder, MD;  Location: [facility];  Service: Endoscopy;  Laterality: N/A;  830   . THYROIDECTOMY Bilateral 11/17/2012   Procedure: TOTAL THYROIDECTOMY;  Surgeon: Ascencion Dike, MD;  Location: [place];  Service: ENT;  Laterality: Bilateral;  . TOTAL THYROIDECTOMY Bilateral 11/17/2012  . TUBAL LIGATION     Social History   Socioeconomic History  . Marital status: Single    Spouse name: Not on file  . Number of children: 2  . Years of education: Not on file  . Highest education level: Not on file  Occupational History  . Occupation: disabled   Social Needs  . Financial resource strain: Not on file  . Food insecurity:    Worry: Not on file    Inability: Not on file  . Transportation needs:    Medical: Not on file    Non-medical: Not on file  Tobacco Use  . Smoking status: Never Smoker  . Smokeless tobacco: Never Used  Substance and Sexual Activity  . Alcohol use: No  . Drug use: No  . Sexual activity: Never  Lifestyle  . Physical activity:    Days per week: Not on file    Minutes per session: Not on file  . Stress: Not on file  Relationships  . Social connections:    Talks on phone: Not on file    Gets together: Not on file    Attends religious service: Not on file    Active member of club or organization: Not on file    Attends meetings of clubs or  organizations: Not on file    Relationship status: Not on file  Other Topics Concern  . Not on file  Social History Narrative  . Not on file   Outpatient Encounter Medications as of 04/08/2018  Medication Sig  . acetaminophen (TYLENOL) 500 MG tablet Take 1 tablet (500 mg total) by mouth 2 (two) times daily.  . benazepril (LOTENSIN) 40 MG tablet TAKE (1) TABLET BY MOUTH DAILY. (Patient taking differently: Take 40 mg by mouth daily. )  . calcitRIOL (ROCALTROL) 0.25 MCG capsule Take 0.25 mcg by mouth every Monday, Wednesday, and Friday. \  . [START ON 04/10/2018] hydrochlorothiazide (HYDRODIURIL) 25 MG tablet Take 0.5 tablets (12.5 mg total) by mouth daily.  Marland Kitchen lamoTRIgine (LAMICTAL) 100 MG tablet Take 1 tablet (100 mg total) by mouth 2 (two) times daily.  Marland Kitchen levETIRAcetam (KEPPRA) 750 MG tablet Two tablets twice daily (Patient taking differently: Take 1,500 mg by mouth 2 (two) times daily. )  . levothyroxine (SYNTHROID, LEVOTHROID) 137 MCG tablet Take 1 tablet (137 mcg total) by mouth daily before breakfast.  . loratadine (CLARITIN) 10 MG tablet TAKE ONE TABLET BY MOUTH ONCE DAILY. (Patient taking differently: Take 10  mg by mouth daily. )  . omeprazole (PRILOSEC) 20 MG capsule TAKE 2 CAPSULES BY MOUTH ONCE DAILY. (Patient taking differently: Take 40 mg by mouth daily. )  . oseltamivir (TAMIFLU) 30 MG capsule Take 1 capsule (30 mg total) by mouth 2 (two) times daily for 2 days.  . rosuvastatin (CRESTOR) 40 MG tablet TAKE 1 TABLET BY MOUTH ONCE A DAY. (Patient taking differently: Take 40 mg by mouth daily. )  . [DISCONTINUED] levothyroxine (SYNTHROID, LEVOTHROID) 137 MCG tablet TAKE 1 TABLET BY MOUTH DAILY BEFORE BREAKFAST. (Patient taking differently: Take 137 mcg by mouth daily before breakfast. )   No facility-administered encounter medications on file as of 04/08/2018.    ALLERGIES: Allergies  Allergen Reactions  . Simvastatin Other (See Comments)    Muscle aches   VACCINATION  STATUS: Immunization History  Administered Date(s) Administered  . H1N1 12/16/2007  . Influenza Split 11/24/2011  . Influenza Whole 11/30/2006, 12/07/2008, 12/26/2009  . Influenza,inj,Quad PF,6+ Mos 11/18/2012, 12/06/2013, 05/23/2015, 01/29/2016, 12/01/2016, 01/18/2018  . Pneumococcal Conjugate-13 04/03/2014  . Td 08/22/2003  . Tdap 04/02/2016    HPI  63 yr old female with medical hx as above . She is here to follow-up for post surgical hypothyroidism and for follow-up of Papillary  thyroid cancer. 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 She has had  Thyrogen stimulated remnant ablation with negative post therapy WBS in 2014.    She is now on Synthroid 137 g by mouth every morning.  - An attempt to biopsy the left cervical 0.9 cm lymph node was abandoned because " no evidence of adenopathy".  - Second Thyrogen Stimulated whole body scan for surveillance shows no scintigraphic evidence of iodine avid metastasis papillary thyroid cancer on 04/18/2016.  -She is compliant  to her thyroid hormone ,denies any new complaints.  -Her most recent surveillance thyroid/neck ultrasound on June 01, 2017, was reported to be unremarkable. she denies palpitations. she denies dysphagia, SOB, nor voice change. she denies family history of thyroid cancer.  Review of Systems  Constitutional: Negative for fatigue.  HENT: Negative for trouble swallowing and voice change.   Eyes: Negative for visual disturbance.  Respiratory: Negative for cough, shortness of breath and wheezing.   Cardiovascular: Negative for chest pain, palpitations and leg swelling.  Gastrointestinal: Negative for diarrhea, nausea and vomiting.  Endocrine: Negative for cold intolerance, heat intolerance, polydipsia, polyphagia and polyuria.  Musculoskeletal: Negative for arthralgias and myalgias.  Skin:  Negative for color change, pallor, rash and wound.  Neurological: Negative for seizures and headaches.  Psychiatric/Behavioral: Negative for confusion and suicidal ideas.    Objective:    BP (!) 143/80   Pulse 84   Ht 5\' 5"  (1.651 m)   Wt 256 lb 3.2 oz (116.2 kg)   BMI 42.63 kg/m   Wt Readings from Last 3 Encounters:  04/08/18 256 lb 3.2 oz (116.2 kg)  04/06/18 263 lb 3.7 oz (119.4 kg)  03/17/18 249 lb (112.9 kg)    Physical Exam  Constitutional: She is oriented to person, place, and time. She appears well-developed.  HENT:  Head: Normocephalic and atraumatic.  Eyes: EOM are normal.  Neck: Normal range of motion. Neck supple. No tracheal deviation present.  Healed scar from prior total thyroidectomy.  No gross mass lesions on physical exam.  Cardiovascular: Normal rate and regular rhythm.  Pulmonary/Chest: Effort normal.  Abdominal: There is no abdominal tenderness.  There is no guarding.  Musculoskeletal: Normal range of motion.        General: No edema.  Neurological: She is alert and oriented to person, place, and time. She has normal reflexes. No cranial nerve deficit. Coordination normal.  Skin: Skin is warm and dry. No rash noted. No erythema. No pallor.  Psychiatric: She has a normal mood and affect. Judgment normal.    Results for orders placed or performed during the hospital encounter of 04/02/18  Blood Culture (routine x 2)  Result Value Ref Range   Specimen Description RIGHT ANTECUBITAL    Special Requests      BOTTLES DRAWN AEROBIC AND ANAEROBIC Blood Culture results may not be optimal due to an excessive volume of blood received in culture bottles   Culture      NO GROWTH 5 DAYS Performed at [facility], [address] [place], [place] [address]    Report Status 04/07/2018 FINAL   Blood Culture (routine x 2)  Result Value Ref Range   Specimen Description BLOOD LEFT HAND    Special Requests      BOTTLES DRAWN AEROBIC ONLY Blood Culture adequate volume    Culture      NO GROWTH 5 DAYS Performed at [facility], [address] [place], [place] [address]    Report Status 04/08/2018 FINAL   MRSA PCR Screening  Result Value Ref Range   MRSA by PCR NEGATIVE NEGATIVE  Lactic acid, plasma  Result Value Ref Range   Lactic Acid, Venous 5.0 (HH) 0.5 - 1.9 mmol/L  Lactic acid, plasma  Result Value Ref Range   Lactic Acid, Venous 7.7 (HH) 0.5 - 1.9 mmol/L  Comprehensive metabolic panel  Result Value Ref Range   Sodium 138 135 - 145 mmol/L   Potassium 3.2 (L) 3.5 - 5.1 mmol/L   Chloride 105 98 - 111 mmol/L   CO2 21 (L) 22 - 32 mmol/L   Glucose, Bld 121 (H) 70 - 99 mg/dL   BUN 38 (H) 8 - 23 mg/dL   Creatinine, Ser 2.17 (H) 0.44 - 1.00 mg/dL   Calcium 9.4 8.9 - 10.3 mg/dL   Total Protein 8.0 6.5 - 8.1 g/dL   Albumin 3.9 3.5 - 5.0 g/dL   AST 27 15 - 41 U/L   ALT 17 0 - 44 U/L   Alkaline Phosphatase 94 38 - 126 U/L   Total Bilirubin 0.6 0.3 - 1.2 mg/dL   GFR calc non Af Amer 23 (L) >60 mL/min   GFR calc Af Amer 27 (L) >60 mL/min   Anion gap 12 5 - 15  CBC WITH DIFFERENTIAL  Result Value Ref Range   WBC 6.3 4.0 - 10.5 K/uL   RBC 3.86 (L) 3.87 - 5.11 MIL/uL   Hemoglobin 10.2 (L) 12.0 - 15.0 g/dL   HCT 32.8 (L) 36.0 - 46.0 %   MCV 85.0 80.0 - 100.0 fL   MCH 26.4 26.0 - 34.0 pg   MCHC 31.1 30.0 - 36.0 g/dL   RDW 14.4 11.5 - 15.5 %   Platelets 188 150 - 400 K/uL   nRBC 0.0 0.0 - 0.2 %   Neutrophils Relative % 77 %   Neutro Abs 4.9 1.7 - 7.7 K/uL   Lymphocytes Relative 7 %   Lymphs Abs 0.5 (L) 0.7 - 4.0 K/uL   Monocytes Relative 14 %   Monocytes Absolute 0.9 0.1 - 1.0 K/uL   Eosinophils Relative 0 %   Eosinophils Absolute 0.0 0.0 - 0.5  K/uL   Basophils Relative 1 %   Basophils Absolute 0.0 0.0 - 0.1 K/uL   Immature Granulocytes 1 %   Abs Immature Granulocytes 0.04 0.00 - 0.07 K/uL  Urinalysis, Routine w reflex microscopic  Result Value Ref Range   Color, Urine YELLOW YELLOW   APPearance CLEAR CLEAR   Specific Gravity, Urine 1.014  1.005 - 1.030   pH 5.0 5.0 - 8.0   Glucose, UA NEGATIVE NEGATIVE mg/dL   Hgb urine dipstick MODERATE (A) NEGATIVE   Bilirubin Urine NEGATIVE NEGATIVE   Ketones, ur NEGATIVE NEGATIVE mg/dL   Protein, ur NEGATIVE NEGATIVE mg/dL   Nitrite NEGATIVE NEGATIVE   Leukocytes, UA SMALL (A) NEGATIVE   RBC / HPF 0-5 0 - 5 RBC/hpf   WBC, UA 21-50 0 - 5 WBC/hpf   Bacteria, UA MANY (A) NONE SEEN   Squamous Epithelial / LPF 0-5 0 - 5  TSH  Result Value Ref Range   TSH 0.018 (L) 0.350 - 4.500 uIU/mL  Phenytoin level, total  Result Value Ref Range   Phenytoin Lvl <2.5 (L) 10.0 - 20.0 ug/mL  Influenza panel by PCR (type A & B)  Result Value Ref Range   Influenza A By PCR POSITIVE (A) NEGATIVE   Influenza B By PCR NEGATIVE NEGATIVE  HIV antibody (Routine Testing)  Result Value Ref Range   HIV Screen 4th Generation wRfx Non Reactive Non Reactive  CBC  Result Value Ref Range   WBC 5.5 4.0 - 10.5 K/uL   RBC 3.41 (L) 3.87 - 5.11 MIL/uL   Hemoglobin 9.2 (L) 12.0 - 15.0 g/dL   HCT 29.0 (L) 36.0 - 46.0 %   MCV 85.0 80.0 - 100.0 fL   MCH 27.0 26.0 - 34.0 pg   MCHC 31.7 30.0 - 36.0 g/dL   RDW 14.3 11.5 - 15.5 %   Platelets 154 150 - 400 K/uL   nRBC 0.0 0.0 - 0.2 %  Comprehensive metabolic panel  Result Value Ref Range   Sodium 139 135 - 145 mmol/L   Potassium 3.9 3.5 - 5.1 mmol/L   Chloride 112 (H) 98 - 111 mmol/L   CO2 21 (L) 22 - 32 mmol/L   Glucose, Bld 114 (H) 70 - 99 mg/dL   BUN 36 (H) 8 - 23 mg/dL   Creatinine, Ser 1.79 (H) 0.44 - 1.00 mg/dL   Calcium 8.5 (L) 8.9 - 10.3 mg/dL   Total Protein 7.1 6.5 - 8.1 g/dL   Albumin 3.2 (L) 3.5 - 5.0 g/dL   AST 21 15 - 41 U/L   ALT 15 0 - 44 U/L   Alkaline Phosphatase 82 38 - 126 U/L   Total Bilirubin 0.5 0.3 - 1.2 mg/dL   GFR calc non Af Amer 30 (L) >60 mL/min   GFR calc Af Amer 34 (L) >60 mL/min   Anion gap 6 5 - 15  Basic metabolic panel  Result Value Ref Range   Sodium 138 135 - 145 mmol/L   Potassium 3.6 3.5 - 5.1 mmol/L   Chloride 109  98 - 111 mmol/L   CO2 20 (L) 22 - 32 mmol/L   Glucose, Bld 88 70 - 99 mg/dL   BUN 24 (H) 8 - 23 mg/dL   Creatinine, Ser 1.79 (H) 0.44 - 1.00 mg/dL   Calcium 8.6 (L) 8.9 - 10.3 mg/dL   GFR calc non Af Amer 30 (L) >60 mL/min   GFR calc Af Amer 34 (L) >60 mL/min   Anion gap 9  5 - 15  CBC  Result Value Ref Range   WBC 3.7 (L) 4.0 - 10.5 K/uL   RBC 3.40 (L) 3.87 - 5.11 MIL/uL   Hemoglobin 9.0 (L) 12.0 - 15.0 g/dL   HCT 29.9 (L) 36.0 - 46.0 %   MCV 87.9 80.0 - 100.0 fL   MCH 26.5 26.0 - 34.0 pg   MCHC 30.1 30.0 - 36.0 g/dL   RDW 14.9 11.5 - 15.5 %   Platelets 129 (L) 150 - 400 K/uL   nRBC 0.0 0.0 - 0.2 %  Lactic acid, plasma  Result Value Ref Range   Lactic Acid, Venous 1.0 0.5 - 1.9 mmol/L  CBC  Result Value Ref Range   WBC 3.0 (L) 4.0 - 10.5 K/uL   RBC 3.33 (L) 3.87 - 5.11 MIL/uL   Hemoglobin 8.9 (L) 12.0 - 15.0 g/dL   HCT 28.9 (L) 36.0 - 46.0 %   MCV 86.8 80.0 - 100.0 fL   MCH 26.7 26.0 - 34.0 pg   MCHC 30.8 30.0 - 36.0 g/dL   RDW 14.3 11.5 - 15.5 %   Platelets 136 (L) 150 - 400 K/uL   nRBC 0.0 0.0 - 0.2 %  Basic metabolic panel  Result Value Ref Range   Sodium 139 135 - 145 mmol/L   Potassium 3.5 3.5 - 5.1 mmol/L   Chloride 111 98 - 111 mmol/L   CO2 21 (L) 22 - 32 mmol/L   Glucose, Bld 92 70 - 99 mg/dL   BUN 19 8 - 23 mg/dL   Creatinine, Ser 1.50 (H) 0.44 - 1.00 mg/dL   Calcium 8.7 (L) 8.9 - 10.3 mg/dL   GFR calc non Af Amer 36 (L) >60 mL/min   GFR calc Af Amer 42 (L) >60 mL/min   Anion gap 7 5 - 15  Magnesium  Result Value Ref Range   Magnesium 1.6 (L) 1.7 - 2.4 mg/dL  CBG monitoring, ED  Result Value Ref Range   Glucose-Capillary 138 (H) 70 - 99 mg/dL   Complete Blood Count (Most recent): Lab Results  Component Value Date   WBC 3.0 (L) 04/05/2018   HGB 8.9 (L) 04/05/2018   HCT 28.9 (L) 04/05/2018   MCV 86.8 04/05/2018   PLT 136 (L) 04/05/2018   Diabetic Labs (most recent): Lab Results  Component Value Date   HGBA1C 5.2 05/26/2017   HGBA1C 5.3  08/10/2012   Lipid Panel     Component Value Date/Time   CHOL 196 01/18/2018 1124   CHOL 231 (H) 06/18/2016 0954   TRIG 84 01/18/2018 1124   HDL 53 01/18/2018 1124   HDL 46 06/18/2016 0954   CHOLHDL 3.7 01/18/2018 1124   VLDL 14 01/29/2016 1109   LDLCALC 125 (H) 01/18/2018 1124    Results for MAFALDA, MCGINNISS (MRN [id number]) as of 04/08/2018 19:27  Ref. Range 03/31/2018 08:21 04/02/2018 22:50  TSH Latest Ref Range: 0.350 - 4.500 uIU/mL 0.02 (L) 0.018 (L)  T4,Free(Direct) Latest Ref Range: 0.8 - 1.8 ng/dL 1.7   Thyroglobulin Latest Units: ng/mL 0.2 (L)   Thyroglobulin Ab Latest Ref Range: < or = 1 IU/mL <1    June 01, 2017  IMPRESSION:  Questioned approximately 2.2 cm soft tissue within the right lobectomy resection bed appears similar to the 07/2015 examination.  Note, while nonspecific, this tissue was apparently present at the time of the Thyrogen stimulated I 131 nuclear medicine whole body scan performed 03/2016 which was negative for the presence of iodine avid metastatic  thyroid cancer or residual disease, and thus favored to be benign etiology. Clinical correlation is advised.  Electronically Signed   By: Sandi Mariscal M.D.   On: 06/01/2017 11:38  Assessment & Plan:   1. Hypothyroidism, postsurgical -Her repeat thyroid function tests are consistent with appropriate replacement. -She is advised to continue with her current dose of levothyroxine 137 mcg p.o. daily before breakfast.   - We discussed about the correct intake of her thyroid hormone, on empty stomach at fasting, with water, separated by at least 30 minutes from breakfast and other medications,  and separated by more than 4 hours from calcium, iron, multivitamins, acid reflux medications (PPIs). -Patient is made aware of the fact that thyroid hormone replacement is needed for life, dose to be adjusted by periodic monitoring of thyroid function tests.  - Target TSH for her would be between 0.1-0.5.  2.  Papillary thyroid carcinoma ([place]) Her  surveillance neck /thyroid u/s from 11/17/2013 was c/w surgically absent thyroid, however there is a 0.9 cm left cervical lymph node. See below.  Pt is s/p thyrogen stimulated thyroid remnant ablation with WBS on 01/14/2013 with no evidence of iodine avid metastasis. She has had pT2,Nx,Mx FVPTC, s/p near total thyroidectomy on September 24,2014. She is counseled to maintain high degree of compliance with synthroid therapy with aim of keeping TSH low normal ( 0.1-0.5).  -she is advised on the necessity of follow-ups and imaging studies at least one annually for the next 5 years. - She was sent for core biopsy of 0.9 cm left cervical lymph node last visit. However, this procedure was abandoned due to " no evidence of adenopathy".  The previously noticed  left cervical lymph node was sent to decrease in size to 0.5 cm. Medicaid did not cover surveillance ultrasound. - Second Thyrogen estimated or body scan for surveillance shows no scintigraphic evidence of iodine avid metastasis papillary thyroid cancer on 04/18/2016. -June 01, 2017 surveillance thyroid/neck ultrasound : Unremarkable, see above.   -She will be considered for Thyrogen stimulated whole body scan before her next visit in 6 months.  She is advised to keep close follow-up with Dr. Tula Nakayama for primary care needs.  Follow up plan: Return in about 6 months (around 10/07/2018) for Follow up with Whole Body Scan w/Thyrogen, Follow up with Pre-visit Labs.  Glade Lloyd, MD Phone: [phone]  Fax: [phone]  -  This note was partially dictated with voice recognition software. Similar sounding words can be transcribed inadequately or may not  be corrected upon review.   04/08/2018, 7:26 PM

## 2018-04-12 ENCOUNTER — Telehealth: Payer: Self-pay | Admitting: Family Medicine

## 2018-04-12 ENCOUNTER — Other Ambulatory Visit: Payer: Self-pay

## 2018-04-12 DIAGNOSIS — I10 Essential (primary) hypertension: Secondary | ICD-10-CM

## 2018-04-12 MED ORDER — HYDROCHLOROTHIAZIDE 25 MG PO TABS: 12.5000 mg | ORAL_TABLET | Freq: Every day | ORAL | Status: DC

## 2018-04-12 NOTE — Telephone Encounter (Signed)
I haven't seen anything- neither has Abby. May need to refax

## 2018-04-12 NOTE — Telephone Encounter (Signed)
[place]  [phone],  LVM to see if you have faxed Resumption of Care?

## 2018-04-13 ENCOUNTER — Other Ambulatory Visit: Payer: Self-pay

## 2018-04-13 ENCOUNTER — Ambulatory Visit (INDEPENDENT_AMBULATORY_CARE_PROVIDER_SITE_OTHER): Payer: Medicaid Other | Admitting: Family Medicine

## 2018-04-13 ENCOUNTER — Encounter: Payer: Self-pay | Admitting: Family Medicine

## 2018-04-13 VITALS — BP 99/78 | HR 95 | Resp 12 | Ht 65.0 in | Wt 249.0 lb

## 2018-04-13 DIAGNOSIS — I10 Essential (primary) hypertension: Secondary | ICD-10-CM | POA: Diagnosis not present

## 2018-04-13 DIAGNOSIS — G40909 Epilepsy, unspecified, not intractable, without status epilepticus: Secondary | ICD-10-CM

## 2018-04-13 DIAGNOSIS — N183 Chronic kidney disease, stage 3 unspecified: Secondary | ICD-10-CM

## 2018-04-13 NOTE — Progress Notes (Signed)
Chief Complaint  Patient presents with  . transition of care visit    HPI: Patient is a 63 y.o. female seen today for hospital follow-up s/p admission from [facility].  [name] went to the hospital because she was having a high fever, body aches, sore throat. Patient experienced 2 episodes of general tonic-clonic seizure with subsequent postictal status. Was found to have an elevated lactic acid and was positive for influenza A.  There was no other source of infection found.  Was admitted to the hospital.  Was found to have acute renal failure superimposed on her stage III kidney disease.  Blood pressure at admit was 106/43.  Normally in the 120s over the 70s to 80s.  [name] has a history of having hypertension, seizure disorder, mild mental retardation, morbid obesity, thyroid cancer, depression.   Hospital course of stay found sepsis had resolved at time of discharge was sent home on Tamiflu for the next 2 days.  And to continue fluid hydration.  Concern of focal absence seizure since 2/82020.  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  Neurology recommended Lamictal dose to be adjusted to continue Keppra 750 mg twice a day.  Follow-up outpatient neurology in 3 weeks.  While in the hospital setting she was found to be hypokalemic as well as having her kidney function decreased with GFR in the 20s.  Acute kidney injury CKD stage III.  Half the dose of HCTZ. Blood pressure had started to increase at time of discharge. They had held her benazepril while she was inpatient and restarted this at discharge.   Past Medical History:  Diagnosis Date  . Depression   . Fractures   . Hyperlipidemia   . Hypertension   . Mental retardation, mild (I.Q. 50-70)   . Obesity   . Seizure ([place])    11/17/2012 "still having them; "I believe their from a tumor in my head" ((11/17/2012  . Seizure disorder ([place])   . Thyroid cancer [facility])     Past Surgical History:  Procedure Laterality Date  .  ABDOMINAL HYSTERECTOMY  2001  . BREAST LUMPECTOMY  1996   right   . COLONOSCOPY N/A 05/16/2016   Procedure: COLONOSCOPY;  Surgeon: Danie Binder, MD;  [facility];  Service: Endoscopy;  Laterality: N/A;  830   . THYROIDECTOMY Bilateral 11/17/2012   Procedure: TOTAL THYROIDECTOMY;  Surgeon: Ascencion Dike, MD;  Location: [facility];  Service: ENT;  Laterality: Bilateral;  . TOTAL THYROIDECTOMY Bilateral 11/17/2012  . TUBAL LIGATION      Allergies  Allergen Reactions  . Simvastatin Other (See Comments)    Muscle aches    Outpatient Encounter Medications as of 04/13/2018  Medication Sig  . acetaminophen (TYLENOL) 500 MG tablet Take 1 tablet (500 mg total) by mouth 2 (two) times daily.  . benazepril (LOTENSIN) 40 MG tablet TAKE (1) TABLET BY MOUTH DAILY. (Patient taking differently: Take 40 mg by mouth daily. )  . calcitRIOL (ROCALTROL) 0.25 MCG capsule Take 0.25 mcg by mouth every Monday, Wednesday, and Friday. \  . hydrochlorothiazide (HYDRODIURIL) 25 MG tablet Take 0.5 tablets (12.5 mg total) by mouth daily.  Marland Kitchen lamoTRIgine (LAMICTAL) 100 MG tablet Take 1 tablet (100 mg total) by mouth 2 (two) times daily.  Marland Kitchen levETIRAcetam (KEPPRA) 750 MG tablet Two tablets twice daily (Patient taking differently: Take 1,500 mg by mouth 2 (two) times daily. )  . levothyroxine (SYNTHROID, LEVOTHROID) 137 MCG tablet Take 1 tablet (137 mcg total)  by mouth daily before breakfast.  . loratadine (CLARITIN) 10 MG tablet TAKE ONE TABLET BY MOUTH ONCE DAILY. (Patient taking differently: Take 10 mg by mouth daily. )  . omeprazole (PRILOSEC) 20 MG capsule TAKE 2 CAPSULES BY MOUTH ONCE DAILY. (Patient taking differently: Take 40 mg by mouth daily. )  . rosuvastatin (CRESTOR) 40 MG tablet TAKE 1 TABLET BY MOUTH ONCE A DAY. (Patient taking differently: Take 40 mg by mouth daily. )   No facility-administered encounter medications on file as of 04/13/2018.     Review of Systems:  Review of Systems  Constitutional:  Negative for activity change, appetite change, chills, fatigue and fever.  HENT: Negative.   Eyes: Negative.   Respiratory: Negative for cough, chest tightness and shortness of breath.   Cardiovascular: Negative for chest pain, palpitations and leg swelling.  Gastrointestinal: Negative.   Endocrine: Negative.   Genitourinary: Negative.   Musculoskeletal: Negative.   Skin: Negative.   Allergic/Immunologic: Negative.   Neurological: Positive for dizziness. Negative for tremors, seizures, weakness and headaches.       Reports having some dizziness at times.  Denies any falls.  Denies any headaches.  Psychiatric/Behavioral: Negative for confusion and sleep disturbance. The patient is not nervous/anxious.   All other systems reviewed and are negative.   Health Maintenance  Topic Date Due  . PAP SMEAR-Modifier  01/29/2019  . MAMMOGRAM  07/14/2019  . TETANUS/TDAP  04/02/2026  . COLONOSCOPY  05/17/2026  . INFLUENZA VACCINE  Completed  . Hepatitis C Screening  Completed  . HIV Screening  Completed    Physical Exam: Vitals:   04/13/18 1041  BP: 107/68  Pulse: 95  Resp: 12  SpO2: 98%  Weight: 249 lb (112.9 kg)  Height: 5\' 5"  (1.651 m)   Body mass index is 41.44 kg/m. Physical Exam Vitals signs and nursing note reviewed.  Constitutional:      Appearance: Normal appearance. She is obese.  HENT:     Head: Normocephalic.     Right Ear: Tympanic membrane, ear canal and external ear normal.     Left Ear: Tympanic membrane, ear canal and external ear normal.     Nose: Nose normal.     Mouth/Throat:     Mouth: Mucous membranes are moist.     Pharynx: Oropharynx is clear.  Eyes:     Conjunctiva/sclera: Conjunctivae normal.  Neck:     Musculoskeletal: Normal range of motion and neck supple.  Cardiovascular:     Rate and Rhythm: Normal rate and regular rhythm.  Extrasystoles are present.    Pulses: Normal pulses.     Heart sounds: Normal heart sounds.     Comments: Question  quadgeminy versus trigeminy while feeling pulse and auscultating heart rate and rhythm.  Performed EKG.  Normal sinus rhythm was found. Pulmonary:     Effort: Pulmonary effort is normal.     Breath sounds: Normal breath sounds.  Abdominal:     General: Abdomen is flat.     Palpations: Abdomen is soft.  Musculoskeletal: Normal range of motion.  Skin:    General: Skin is warm and dry.     Capillary Refill: Capillary refill takes less than 2 seconds.  Neurological:     Mental Status: She is alert and oriented to person, place, and time.     Motor: No weakness.     Gait: Gait normal.  Psychiatric:        Attention and Perception: Attention normal.  Mood and Affect: Mood normal.        Speech: Speech is delayed.        Behavior: Behavior normal. Behavior is cooperative.        Thought Content: Thought content normal.        Cognition and Memory: Cognition normal.        Judgment: Judgment normal.     Comments: Mild retardation.  Communication was pleasant.  Attention appeared normal.     Filed Weights   04/13/18 1041  Weight: 249 lb (112.9 kg)     Labs reviewed: Basic Metabolic Panel: Recent Labs    09/29/17 0926  03/31/18 0821  04/02/18 2250 04/03/18 0552 04/04/18 0413 04/05/18 0610  NA  --    < >  --   --  138 139 138 139  K  --    < >  --   --  3.2* 3.9 3.6 3.5  CL  --    < >  --   --  105 112* 109 111  CO2  --    < >  --   --  21* 21* 20* 21*  GLUCOSE  --    < >  --   --  121* 114* 88 92  BUN  --    < >  --   --  38* 36* 24* 19  CREATININE  --    < >  --    < > 2.17* 1.79* 1.79* 1.50*  CALCIUM  --    < >  --   --  9.4 8.5* 8.6* 8.7*  MG  --   --   --   --   --   --   --  1.6*  TSH 1.40  --  0.02*  --  0.018*  --   --   --    < > = values in this interval not displayed.   Liver Function Tests: Recent Labs    01/18/18 1124 04/02/18 2250 04/03/18 0552  AST 17 27 21   ALT 10 17 15   ALKPHOS  --  94 82  BILITOT 0.5 0.6 0.5  PROT 8.2* 8.0 7.1  ALBUMIN   --  3.9 3.2*   No results for input(s): LIPASE, AMYLASE in the last 8760 hours. No results for input(s): AMMONIA in the last 8760 hours. CBC: Recent Labs    04/02/18 2250 04/03/18 0552 04/04/18 0413 04/05/18 0610  WBC 6.3 5.5 3.7* 3.0*  NEUTROABS 4.9  --   --   --   HGB 10.2* 9.2* 9.0* 8.9*  HCT 32.8* 29.0* 29.9* 28.9*  MCV 85.0 85.0 87.9 86.8  PLT 188 154 129* 136*   Lipid Panel: Recent Labs    08/21/17 1004 01/18/18 1124  CHOL 199 196  HDL 50* 53  LDLCALC 132* 125*  TRIG 74 84  CHOLHDL 4.0 3.7   Lab Results  Component Value Date   HGBA1C 5.2 05/26/2017    Procedures since last visit: No results found.  Assessment/Plan  1. CKD (chronic kidney disease) stage 3, GFR 30-59 ml/min (HCC) While inpatient she was found to have acute renal failure on top of her CKD stage III.  Upon discharge her GFR had increased to 42.  BUN was back in baseline along with creatinine.  Will check CMP with GFR and CBC today.  She reports taking her hydrochlorothiazide as directed by having the tablet.  Does have a nurse that helps her with these things and she makes  sure that the medication changes are implemented.  Reports that she has been drinking fluids have a bottle of water with her today in the office.  Reports eating well.   - COMPLETE METABOLIC PANEL WITH GFR - CBC  2. Obesity, Class III, BMI 40-49.9 (morbid obesity) (HCC) Weight has been steady over the last 4 to 5 months.  She is reports not changing her diet.  Eats a lot of meat and processed foods.  Educated her on eating a better more well-rounded diet with fruits and vegetables.  Encouraged her to continue to drink of water to stay hydrated.  We will continue to follow this.   3. Seizure disorder ([place]) Focal seizure questioned while in the hospital EEG demonstrated acute epilepsy.  Medications were adjusted.  Needs to follow-up for outpatient neurology within 3 weeks.  Reports that she is going to be going to them tomorrow  actually.  Encouraged her to continue the medications as directed.  She reports no seizures.   4. Essential hypertension Blood pressure today in the office has ranged from the upper 39J systolically to the low 673A systolically over the 19F to low 79K diastolically.  While inpatient her hydrochlorothiazide was reduced secondary to having her CKD.  Her benazepril was held during the hospital stay as well.  Due to low volume status.  She was fluid resuscitated and lactic acid had returned to normal.  Sepsis was cleared.  Blood pressure was reportedly as rising at discharge.  Blood pressure on admission was 106/43.   Today in office she is not far off from what she was in the inpatient status.  Reports that she is taking her medication changes as were directed.  Gave the discharge paperwork to her nurse to help fix her medications.  Does have reports of having some dizziness at times.  Denies having any falls.  Denies having any fluid overload.  Denies having any chest pain, any fluttering sensations, any shortness of breath.    Check the blood pressure bilaterally several times during the office visit.  Blood pressure never responded to rest or walking.  Will reduce the amount of her benazepril today in office.  Currently on 40 mg daily.  Will decrease this to 20 mg daily.  Incongruence with her hydrochlorothiazide maintaining it at 12.5 mg daily.  We will have her come back in in 1 week for blood pressure check.  Catered her on having increased dizziness, lightheadedness, any falls related to this, or changes in her health, to contact the office immediately and/or go to the nearest emergency room.   Next appt:  04/20/2018               This Appt Provider:  Perlie Mayo, NP__02/18/20

## 2018-04-13 NOTE — Patient Instructions (Addendum)
    Thank you for coming into the office today. I appreciate the opportunity to provide you with the care for your transition of care from being in the hospital with the flu.  Please go next door to have your labs drawn today.  Today you were found to have LOW BLOOD PRESSURE.   I would like to reduce your dose of Benazepril from 40 mg to 20 mg daily. So, please if you have a pill cutter cut the pill in half and take half a tablet daily.  I would like to see you back in the office in 1 week to follow up on this.  Both Benazepril and Hydrochlorothiazide are half tablets.  If you continue to feel dizzy or fall please contact the office or go to your nearest ER to be seen.   It was a pleasure to see you and I look forward to continuing to work together on your health and well-being. Please do not hesitate to call the office if you need care or have questions about your care.  Have a wonderful day and week.  With Gratitude,  Cherly Beach, DNP, AGNP-BC

## 2018-04-14 ENCOUNTER — Telehealth: Payer: Self-pay

## 2018-04-14 DIAGNOSIS — N183 Chronic kidney disease, stage 3 unspecified: Secondary | ICD-10-CM

## 2018-04-14 LAB — COMPLETE METABOLIC PANEL WITHOUT GFR
AG Ratio: 1.1 (calc) (ref 1.0–2.5)
ALT: 26 U/L (ref 6–29)
AST: 35 U/L (ref 10–35)
Albumin: 4.1 g/dL (ref 3.6–5.1)
Alkaline phosphatase (APISO): 86 U/L (ref 37–153)
BUN/Creatinine Ratio: 17 (calc) (ref 6–22)
BUN: 33 mg/dL — ABNORMAL HIGH (ref 7–25)
CO2: 27 mmol/L (ref 20–32)
Calcium: 9.4 mg/dL (ref 8.6–10.4)
Chloride: 101 mmol/L (ref 98–110)
Creat: 1.99 mg/dL — ABNORMAL HIGH (ref 0.50–0.99)
GFR, Est African American: 30 mL/min/1.73m2 — ABNORMAL LOW
GFR, Est Non African American: 26 mL/min/1.73m2 — ABNORMAL LOW
Globulin: 3.8 g/dL — ABNORMAL HIGH (ref 1.9–3.7)
Glucose, Bld: 90 mg/dL (ref 65–139)
Potassium: 4.3 mmol/L (ref 3.5–5.3)
Sodium: 138 mmol/L (ref 135–146)
Total Bilirubin: 0.6 mg/dL (ref 0.2–1.2)
Total Protein: 7.9 g/dL (ref 6.1–8.1)

## 2018-04-14 LAB — CBC
HCT: 34.3 % — ABNORMAL LOW (ref 35.0–45.0)
Hemoglobin: 11.1 g/dL — ABNORMAL LOW (ref 11.7–15.5)
MCH: 27.3 pg (ref 27.0–33.0)
MCHC: 32.4 g/dL (ref 32.0–36.0)
MCV: 84.3 fL (ref 80.0–100.0)
MPV: 11.4 fL (ref 7.5–12.5)
Platelets: 299 10*3/uL (ref 140–400)
RBC: 4.07 10*6/uL (ref 3.80–5.10)
RDW: 14.2 % (ref 11.0–15.0)
WBC: 5.6 10*3/uL (ref 3.8–10.8)

## 2018-04-14 NOTE — Telephone Encounter (Signed)
Labs ordered, patient aware

## 2018-04-15 ENCOUNTER — Other Ambulatory Visit: Payer: Self-pay | Admitting: Family Medicine

## 2018-04-15 DIAGNOSIS — I10 Essential (primary) hypertension: Secondary | ICD-10-CM

## 2018-04-16 ENCOUNTER — Telehealth: Payer: Self-pay | Admitting: *Deleted

## 2018-04-16 NOTE — Telephone Encounter (Signed)
noted 

## 2018-04-16 NOTE — Telephone Encounter (Signed)
[name] called stated she went out to do [employer] pill box. Stated there were medication changes by Dr. Moshe Cipro and Dr. Merlene Laughter. She took care of those changes. When she went out [name]'s BP was 152/80 and she had not taken her blood pressure meds the day before. She just wanted to let Dr. Moshe Cipro know that she had gotten her medicines filled and started both of her bp meds yesterday.

## 2018-04-16 NOTE — Telephone Encounter (Signed)
FYI

## 2018-04-20 ENCOUNTER — Ambulatory Visit: Payer: Medicaid Other | Admitting: Family Medicine

## 2018-04-27 LAB — COMPLETE METABOLIC PANEL WITH GFR
AG Ratio: 1.1 (calc) (ref 1.0–2.5)
ALT: 9 U/L (ref 6–29)
AST: 15 U/L (ref 10–35)
Albumin: 4 g/dL (ref 3.6–5.1)
Alkaline phosphatase (APISO): 110 U/L (ref 37–153)
BUN/Creatinine Ratio: 19 (calc) (ref 6–22)
BUN: 36 mg/dL — ABNORMAL HIGH (ref 7–25)
CO2: 24 mmol/L (ref 20–32)
Calcium: 9.7 mg/dL (ref 8.6–10.4)
Chloride: 105 mmol/L (ref 98–110)
Creat: 1.85 mg/dL — ABNORMAL HIGH (ref 0.50–0.99)
GFR, Est African American: 33 mL/min/{1.73_m2} — ABNORMAL LOW (ref >=60)
GFR, Est Non African American: 28 mL/min/{1.73_m2} — ABNORMAL LOW (ref >=60)
Globulin: 3.6 g/dL (calc) (ref 1.9–3.7)
Glucose, Bld: 94 mg/dL (ref 65–99)
Potassium: 4.4 mmol/L (ref 3.5–5.3)
Sodium: 139 mmol/L (ref 135–146)
Total Bilirubin: 0.5 mg/dL (ref 0.2–1.2)
Total Protein: 7.6 g/dL (ref 6.1–8.1)

## 2018-04-29 ENCOUNTER — Ambulatory Visit (INDEPENDENT_AMBULATORY_CARE_PROVIDER_SITE_OTHER): Payer: Medicaid Other | Admitting: Family Medicine

## 2018-04-29 ENCOUNTER — Encounter: Payer: Self-pay | Admitting: Family Medicine

## 2018-04-29 VITALS — BP 130/90 | HR 94 | Resp 14 | Ht 65.0 in | Wt 247.0 lb

## 2018-04-29 DIAGNOSIS — I10 Essential (primary) hypertension: Secondary | ICD-10-CM

## 2018-04-29 DIAGNOSIS — I129 Hypertensive chronic kidney disease with stage 1 through stage 4 chronic kidney disease, or unspecified chronic kidney disease: Secondary | ICD-10-CM | POA: Diagnosis not present

## 2018-04-29 DIAGNOSIS — N183 Chronic kidney disease, stage 3 unspecified: Secondary | ICD-10-CM

## 2018-04-29 MED ORDER — AMLODIPINE BESYLATE 5 MG PO TABS: 5.0000 mg | ORAL_TABLET | Freq: Every day | ORAL | 1 refills | Status: DC

## 2018-04-29 MED ORDER — CHLORTHALIDONE 25 MG PO TABS: 12.5000 mg | ORAL_TABLET | Freq: Every day | ORAL | 1 refills | Status: DC

## 2018-04-29 NOTE — Progress Notes (Signed)
Established Patient Office Visit  Subjective:  Patient ID: [name], female    DOB: [birth year]  Age: 63 y.o. MRN: [id number]  CC:  Chief Complaint  Patient presents with  . Chronic Kidney Disease    follow up    HPI [name] is a 63 year old female patient in today for chronic kidney disease follow-up. [name] had been hospitalized and found to have acute renal failure on top of chronic kidney disease.  She had presented to the office back on 04/13/2018 for follow-up and transition of care.  At that time we drew labs and her BUN was 33 and creatinine was 1.99, with a GFR of 30.  Her benazepril was reduced to 20 mg daily.  With maintaining hydrochlorothiazide at 12.5 mg daily.  She was set to follow-up today.  Labs were drawn on March 3 of 2020.  BUN 36, creatinine 1.85, GFR 33.  There is been some increase in her function.  Still concerned for current medication regime.  Possible change of medication might be needed at this time.  This was discussed with her at the last visit as well.  Overall, [name] reports that she is feeling well.  Has no complaints today in the office.  Did not bring in her blood pressure readings that her home health care nurse takes for her.  Denies having any trouble taking her current medications.  Denies having any chest pain, shortness of breath, leg swelling, palpitations, blurred vision, headaches, dizziness, falls.  Past Medical, Surgical, Social History, Allergies, and Medications have been Reviewed.   Past Medical History:  Diagnosis Date  . Depression   . Fractures   . Hyperlipidemia   . Hypertension   . Mental retardation, mild (I.Q. 50-70)   . Obesity   . Seizure ([place])    11/17/2012 "still having them; "I believe their from a tumor in my head" ((11/17/2012  . Seizure disorder ([place])   . Thyroid cancer [facility])     Past Surgical History:  Procedure Laterality Date  . ABDOMINAL HYSTERECTOMY  2001  . BREAST LUMPECTOMY  1996   right   . COLONOSCOPY N/A 05/16/2016   Procedure: COLONOSCOPY;  Surgeon: Danie Binder, MD;  [facility];  Service: Endoscopy;  Laterality: N/A;  830   . THYROIDECTOMY Bilateral 11/17/2012   Procedure: TOTAL THYROIDECTOMY;  Surgeon: Ascencion Dike, MD;  Location: [place];  Service: ENT;  Laterality: Bilateral;  . TOTAL THYROIDECTOMY Bilateral 11/17/2012  . TUBAL LIGATION      Family History  Problem Relation Age of Onset  . Diabetes Mother     Social History   Socioeconomic History  . Marital status: Single    Spouse name: Not on file  . Number of children: 2  . Years of education: Not on file  . Highest education level: Not on file  Occupational History  . Occupation: disabled   Social Huffman  . Financial resource strain: Not on file  . Food insecurity:    Worry: Not on file    Inability: Not on file  . Transportation Huffman:    Medical: Not on file    Non-medical: Not on file  Tobacco Use  . Smoking status: Never Smoker  . Smokeless tobacco: Never Used  Substance and Sexual Activity  . Alcohol use: No  . Drug use: No  . Sexual activity: Never  Lifestyle  . Physical activity:    Days per week: Not on file  Minutes per session: Not on file  . Stress: Not on file  Relationships  . Social connections:    Talks on phone: Not on file    Gets together: Not on file    Attends religious service: Not on file    Active member of club or organization: Not on file    Attends meetings of clubs or organizations: Not on file    Relationship status: Not on file  . Intimate partner violence:    Fear of current or ex partner: Not on file    Emotionally abused: Not on file    Physically abused: Not on file    Forced sexual activity: Not on file  Other Topics Concern  . Not on file  Social History Narrative  . Not on file    Outpatient Medications Prior to Visit  Medication Sig Dispense Refill  . acetaminophen (TYLENOL) 500 MG tablet Take 1 tablet (500 mg total) by  mouth 2 (two) times daily. 60 tablet 5  . benazepril (LOTENSIN) 40 MG tablet Take 0.5 tablets (20 mg total) by mouth daily. 30 tablet 0  . calcitRIOL (ROCALTROL) 0.25 MCG capsule Take 0.25 mcg by mouth every Monday, Wednesday, and Friday. \    . hydrochlorothiazide (HYDRODIURIL) 25 MG tablet Take 0.5 tablets (12.5 mg total) by mouth daily. 30 tablet 2  . lamoTRIgine (LAMICTAL) 100 MG tablet Take 1 tablet (100 mg total) by mouth 2 (two) times daily. 30 tablet 3  . levETIRAcetam (KEPPRA) 750 MG tablet Two tablets twice daily (Patient taking differently: Take 1,500 mg by mouth 2 (two) times daily. ) 120 tablet 3  . levothyroxine (SYNTHROID, LEVOTHROID) 137 MCG tablet Take 1 tablet (137 mcg total) by mouth daily before breakfast. 30 tablet 6  . loratadine (CLARITIN) 10 MG tablet TAKE ONE TABLET BY MOUTH ONCE DAILY. (Patient taking differently: Take 10 mg by mouth daily. ) 30 tablet 5  . omeprazole (PRILOSEC) 20 MG capsule TAKE 2 CAPSULES BY MOUTH ONCE DAILY. (Patient taking differently: Take 40 mg by mouth daily. ) 60 capsule 5  . rosuvastatin (CRESTOR) 40 MG tablet TAKE 1 TABLET BY MOUTH ONCE A DAY. (Patient taking differently: Take 40 mg by mouth daily. ) 30 tablet 5   No facility-administered medications prior to visit.     Allergies  Allergen Reactions  . Simvastatin Other (See Comments)    Muscle aches    ROS Review of Systems  Constitutional: Negative for activity change, appetite change, chills, fatigue and fever.  HENT: Negative for congestion, ear pain, sinus pressure, sinus pain and sore throat.   Eyes: Negative.   Respiratory: Negative for cough, chest tightness and shortness of breath.   Cardiovascular: Negative for chest pain, palpitations and leg swelling.  Gastrointestinal: Negative.   Endocrine: Negative for polydipsia, polyphagia and polyuria.  Genitourinary: Negative.   Musculoskeletal: Negative.   Skin: Negative.   Allergic/Immunologic: Negative.   Neurological:  Negative for dizziness, tremors, weakness, light-headedness and headaches.  Hematological: Negative.   Psychiatric/Behavioral: Negative.        Denies falls.  All other systems reviewed and are negative.     Objective:    Physical Exam  Constitutional: She is oriented to person, place, and time. She appears well-developed and well-nourished.  Obese  HENT:  Head: Normocephalic.  Right Ear: External ear normal.  Left Ear: External ear normal.  Nose: Nose normal.  Mouth/Throat: Oropharynx is clear and moist.  Eyes: Conjunctivae are normal. Right eye exhibits no discharge. Left eye  exhibits no discharge.  Neck: Normal range of motion. Neck supple.  Cardiovascular: Normal rate, regular rhythm, normal heart sounds and intact distal pulses.  Pulmonary/Chest: Effort normal and breath sounds normal.  Musculoskeletal: Normal range of motion.  Neurological: She is alert and oriented to person, place, and time.  Skin: Skin is warm and dry.  Psychiatric: She has a normal mood and affect. Her behavior is normal. Judgment and thought content normal.  Impaired cognition.  Communication was pleasant.  Attention appeared normal.  Good eye contact.  Nursing note and vitals reviewed.   BP (!) 141/79   Pulse 94   Resp 14   Ht 5\' 5"  (1.651 m)   Wt 247 lb 0.6 oz (112.1 kg)   SpO2 97% Comment: room air  BMI 41.11 kg/m  Wt Readings from Last 3 Encounters:  04/29/18 247 lb 0.6 oz (112.1 kg)  04/13/18 249 lb (112.9 kg)  04/08/18 256 lb 3.2 oz (116.2 kg)    Lab Results  Component Value Date   TSH 0.018 (L) 04/02/2018   Lab Results  Component Value Date   WBC 5.6 04/13/2018   HGB 11.1 (L) 04/13/2018   HCT 34.3 (L) 04/13/2018   MCV 84.3 04/13/2018   PLT 299 04/13/2018   Lab Results  Component Value Date   NA 139 04/27/2018   K 4.4 04/27/2018   CO2 24 04/27/2018   GLUCOSE 94 04/27/2018   BUN 36 (H) 04/27/2018   CREATININE 1.85 (H) 04/27/2018   BILITOT 0.5 04/27/2018   ALKPHOS 82  04/03/2018   AST 15 04/27/2018   ALT 9 04/27/2018   PROT 7.6 04/27/2018   ALBUMIN 3.2 (L) 04/03/2018   CALCIUM 9.7 04/27/2018   ANIONGAP 7 04/05/2018   Lab Results  Component Value Date   CHOL 196 01/18/2018   Lab Results  Component Value Date   HDL 53 01/18/2018   Lab Results  Component Value Date   LDLCALC 125 (H) 01/18/2018   Lab Results  Component Value Date   TRIG 84 01/18/2018   Lab Results  Component Value Date   CHOLHDL 3.7 01/18/2018   Lab Results  Component Value Date   HGBA1C 5.2 05/26/2017      Assessment & Plan:   1. CKD (chronic kidney disease) stage 3, GFR 30-59 ml/min (HCC) There was some very mild improvement in kidney function from the last 2 weeks.  Secondary to the reduction of the benazepril.  However at this time due to current GFR staying in the 30s.  Would be more beneficial to transition her to more kidney friendly medications. Reports that she is staying hydrated.  The BUN continues to stay  Elevated.  Will be changing her from benazepril to amlodipine 5 mg daily.  And stopping her hydrochlorothiazide and starting her on chlorthalidone 12.5 mg daily.  We will see her back in 5 weeks for follow-up labs to assess kidney function at that time.    2. Hypertensive kidney disease with CKD stage III ([place]) As per nephrology, believe kidney disease is secondary to having ongoing uncontrolled high blood pressure.  Not being as compliant with medications at certain times.  Does have a home health care nurse that comes out every 2 weeks to help with refilling her medication holders.  Reports that she also takes her blood pressure.  But she is never brought in any blood pressure readings.  Even though it is been requested.  Please see above for medication changes regarding blood pressure and kidney  function.  3. Essential hypertension Blood pressure today in the office is ranging in the 130s over the 80s to the low 90s.  Reports that she is taking her  medications as prescribed.  Is able to verbalize which medication she is taking.  Due to the current GFR and creatinine levels will change her to a calcium channel blocker, and loop diuretic.  We will see if this helps maintain lowered blood pressure as well as preserved kidney function.  - amLODipine (NORVASC) 5 MG tablet; Take 1 tablet (5 mg total) by mouth daily.  Dispense: 30 tablet; Refill: 1 - chlorthalidone (HYGROTON) 25 MG tablet; Take 0.5 tablets (12.5 mg total) by mouth daily.  Dispense: 30 tablet; Refill: 1  Follow-up: 5 weeks, with CMP with GFR 1 week before.   Perlie Mayo, NP

## 2018-04-29 NOTE — Patient Instructions (Addendum)
    Thank you for coming into the office today. I appreciate the opportunity to provide you with the care for your health and wellness.  I will discuss medication changes with Dr. Moshe Cipro, if we decided to make changes to your current medications, we will call you and send to the pharmacy for them to deliver.   Please bring your blood pressure readings from your nurse to next visit.   Once it is warmer weather, work to get outside and walk.  It was a pleasure to see you and I look forward to continuing to work together on your health and well-being. Please do not hesitate to call the office if you need care or have questions about your care.  Have a wonderful day and week.  With Gratitude,  Cherly Beach, DNP, AGNP-BC

## 2018-05-12 ENCOUNTER — Other Ambulatory Visit: Payer: Self-pay | Admitting: Family Medicine

## 2018-05-19 ENCOUNTER — Other Ambulatory Visit: Payer: Self-pay | Admitting: Family Medicine

## 2018-05-20 ENCOUNTER — Ambulatory Visit: Payer: Medicaid Other | Admitting: Family Medicine

## 2018-06-08 ENCOUNTER — Other Ambulatory Visit: Payer: Self-pay

## 2018-06-08 ENCOUNTER — Ambulatory Visit (INDEPENDENT_AMBULATORY_CARE_PROVIDER_SITE_OTHER): Payer: Medicaid Other | Admitting: Family Medicine

## 2018-06-08 ENCOUNTER — Encounter: Payer: Self-pay | Admitting: Family Medicine

## 2018-06-08 DIAGNOSIS — G40909 Epilepsy, unspecified, not intractable, without status epilepticus: Secondary | ICD-10-CM | POA: Diagnosis not present

## 2018-06-08 DIAGNOSIS — I10 Essential (primary) hypertension: Secondary | ICD-10-CM

## 2018-06-08 DIAGNOSIS — N183 Chronic kidney disease, stage 3 unspecified: Secondary | ICD-10-CM

## 2018-06-08 DIAGNOSIS — K219 Gastro-esophageal reflux disease without esophagitis: Secondary | ICD-10-CM

## 2018-06-08 DIAGNOSIS — E785 Hyperlipidemia, unspecified: Secondary | ICD-10-CM

## 2018-06-08 MED ORDER — CHLORTHALIDONE 25 MG PO TABS: 12.5000 mg | ORAL_TABLET | Freq: Every day | ORAL | 5 refills | Status: DC

## 2018-06-08 MED ORDER — LAMOTRIGINE 100 MG PO TABS: 100.0000 mg | ORAL_TABLET | Freq: Two times a day (BID) | ORAL | 5 refills | Status: DC

## 2018-06-08 MED ORDER — ROSUVASTATIN CALCIUM 40 MG PO TABS: 40.0000 mg | ORAL_TABLET | Freq: Every day | ORAL | 0 refills | Status: DC

## 2018-06-08 MED ORDER — AMLODIPINE BESYLATE 5 MG PO TABS: 5.0000 mg | ORAL_TABLET | Freq: Every day | ORAL | 1 refills | Status: DC

## 2018-06-08 MED ORDER — OMEPRAZOLE 20 MG PO CPDR: 40.0000 mg | DELAYED_RELEASE_CAPSULE | Freq: Every day | ORAL | 5 refills | Status: DC

## 2018-06-08 MED ORDER — BENAZEPRIL HCL 20 MG PO TABS: 20.0000 mg | ORAL_TABLET | Freq: Every day | ORAL | 5 refills | Status: DC

## 2018-06-08 NOTE — Patient Instructions (Addendum)
    Thank you for allowing [place] to complete your visit via telephone today. I appreciate the opportunity to provide you with the care for your health and wellness. Today we discussed: Overall health, and kidneys.  Have an order for you to get your kidney function and labs drawn please do so in the next 2 weeks.  Prior to your next appointment with Dr. Moshe Cipro so that she can see how your kidneys are functioning.  These continue to take your medications as directed you are doing a good job with that.  Please see if you can get your nurse to write down your blood pressure so that Dr. Moshe Cipro can know at the next visit.  [place] YOUR HANDS WELL AND FREQUENTLY. AVOID TOUCHING YOUR FACE, UNLESS YOUR HANDS ARE FRESHLY WASHED.  GET FRESH AIR DAILY. STAY HYDRATED WITH WATER.   It was a pleasure to see you and I look forward to continuing to work together on your health and well-being.Please do not hesitate to call the office if you need care or have questions about your care.  Have a wonderful day and week. With Gratitude, Cherly Beach, DNP, AGNP-BC

## 2018-06-08 NOTE — Progress Notes (Signed)
Virtual Visit via Telephone Note  I connected with [name] on 06/08/18 at 10:40 AM EDT by telephone and verified that I am speaking with the correct person using two identifiers.   I discussed the limitations, risks, security and privacy concerns of performing an evaluation and management service by telephone and the availability of in person appointments. I also discussed with the patient that there may be a patient responsible charge related to this service. The patient expressed understanding and agreed to proceed.  Location of Patient: Home Location of Provider: Telehealth Consent was obtain for visit to be over via telehealth.  History of Present Illness:  [name] is a 63 year old female patient of Dr. Griffin Dakin that presented today for follow-up related to kidney function.  She was supposed to get labs a week before this visit.  But she has not been out secondary to the virus.  She has an upcoming appointment in a couple weeks with Dr. Moshe Cipro.  I really strongly encouraged her to see if she can get her labs drawn so that we can see if her function has improved some from the last visit.    She reports that she is staying hydrated.  Changed her at the last visit to Norvasc 5 mg daily.  Stopped her hydrochlorothiazide install it started her on chlorthalidone 12.5 mg daily.  Need to get her labs redrawn to see if these have improved her kidney function since stopping the benazepril and chlorothiazide.  Had asked her to get a record from the health care nurse that comes out to check her blood pressure to write it down for her so that she could provide it to [place] at her next visit.  However this was not done.  So unsure of how her blood pressure is in relation to the change in medications as well.  Overall [name] has no complaints today over the telephone.  Full ROS questioned with denial of any signs or symptoms secondary to any infections.  Reports she is taking her medications as  directed.  Her home health nurse helps her with regulation of these.  Does report being a little bit worried about the virus.  Unsure if she should get her labs or go outside secondary to the virus.  Past Medical, Surgical, Social History, Allergies, and Medications have been Reviewed.   Review of Systems  Constitutional: Negative for chills and fever.  HENT: Negative for congestion, sinus pain and sore throat.   Eyes: Negative for pain, discharge and redness.  Respiratory: Negative for cough, shortness of breath and wheezing.   Cardiovascular: Negative for chest pain, palpitations and leg swelling.  Gastrointestinal: Negative.   Genitourinary: Negative.   Musculoskeletal: Negative.   Neurological: Negative for dizziness and headaches.  Endo/Heme/Allergies: Negative.   Psychiatric/Behavioral: Negative.   All other systems reviewed and are negative.  Observations/Objective: Physical Exam  Neurological: She is alert.  Psychiatric: Mood and affect normal.  Impaired cognition.  Communication was pleasant, questionable attention given phone visit.       Assessment and Plan: 1. CKD (chronic kidney disease) stage 3, GFR 30-59 ml/min (HCC) Unsure of how her kidney function is because we have not been able to get labs on her.  Strongly encouraged that she could do this safely.  Advised her to try to get this done before she goes to see Dr. Moshe Cipro.  Educated her that they could put a mask on her and they wear a mask when they are drawing the labs.  She still appeared to be frightened with the fact of going out secondary to getting her labs drawn.  2. Essential hypertension Unsure if blood pressure is stable secondary to the changes that we made about a month ago.  Again advised her to have the home health nurse write down the blood pressures that she is getting so that we can get a measurement.  Hopefully at next visit we will have some measurements to report.  Otherwise might need to bring her  into the office to get a measurement done.  Also will need to look at getting her labs drawn so that we can make sure her kidney function has been stable since changing her medications.  - amLODipine (NORVASC) 5 MG tablet; Take 1 tablet (5 mg total) by mouth daily.  Dispense: 30 tablet; Refill: 1 - chlorthalidone (HYGROTON) 25 MG tablet; Take 0.5 tablets (12.5 mg total) by mouth daily.  Dispense: 30 tablet; Refill: 5  3. Seizure disorder (HCC) Stable, refill  - lamoTRIgine (LAMICTAL) 100 MG tablet; Take 1 tablet (100 mg total) by mouth 2 (two) times daily.  Dispense: 30 tablet; Refill: 5  4. Hyperlipidemia LDL goal <100 Unchanged, LDL still elevated in November of last year.  Refill  - rosuvastatin (CRESTOR) 40 MG tablet; Take 1 tablet (40 mg total) by mouth daily.  Dispense: 90 tablet; Refill: 0  5. Gastroesophageal reflux disease, esophagitis presence not specified Stable no complaints of GERD today, refill  - omeprazole (PRILOSEC) 20 MG capsule; Take 2 capsules (40 mg total) by mouth daily.  Dispense: 60 capsule; Refill: 5   Follow Up Instructions: AVS printed and mailed.   I discussed the assessment and treatment plan with the patient. The patient was provided an opportunity to ask questions and all were answered. The patient agreed with the plan and demonstrated an understanding of the instructions.   The patient was advised to call back or seek an in-person evaluation if the symptoms worsen or if the condition fails to improve as anticipated.  I provided 15 minutes of non-face-to-face time during this encounter.  Perlie Mayo, NP

## 2018-06-11 ENCOUNTER — Other Ambulatory Visit: Payer: Self-pay | Admitting: Family Medicine

## 2018-06-16 LAB — COMPLETE METABOLIC PANEL WITH GFR
AG Ratio: 1 (calc) (ref 1.0–2.5)
ALT: 10 U/L (ref 6–29)
AST: 15 U/L (ref 10–35)
Albumin: 3.9 g/dL (ref 3.6–5.1)
Alkaline phosphatase (APISO): 106 U/L (ref 37–153)
BUN/Creatinine Ratio: 20 (calc) (ref 6–22)
BUN: 39 mg/dL — ABNORMAL HIGH (ref 7–25)
CO2: 27 mmol/L (ref 20–32)
Calcium: 10 mg/dL (ref 8.6–10.4)
Chloride: 103 mmol/L (ref 98–110)
Creat: 1.94 mg/dL — ABNORMAL HIGH (ref 0.50–0.99)
GFR, Est African American: 31 mL/min/{1.73_m2} — ABNORMAL LOW (ref >=60)
GFR, Est Non African American: 27 mL/min/{1.73_m2} — ABNORMAL LOW (ref >=60)
Globulin: 3.9 g/dL (calc) — ABNORMAL HIGH (ref 1.9–3.7)
Glucose, Bld: 85 mg/dL (ref 65–139)
Potassium: 3.9 mmol/L (ref 3.5–5.3)
Sodium: 138 mmol/L (ref 135–146)
Total Bilirubin: 0.4 mg/dL (ref 0.2–1.2)
Total Protein: 7.8 g/dL (ref 6.1–8.1)

## 2018-06-22 ENCOUNTER — Encounter: Payer: Medicaid Other | Admitting: Family Medicine

## 2018-06-28 ENCOUNTER — Other Ambulatory Visit: Payer: Self-pay

## 2018-06-28 ENCOUNTER — Ambulatory Visit (INDEPENDENT_AMBULATORY_CARE_PROVIDER_SITE_OTHER): Payer: Medicaid Other | Admitting: Family Medicine

## 2018-06-28 ENCOUNTER — Other Ambulatory Visit (HOSPITAL_COMMUNITY)
Admission: RE | Admit: 2018-06-28 | Discharge: 2018-06-28 | Disposition: A | Discharge status: Home / Self Care | Payer: Medicaid Other | Source: Ambulatory Visit | Attending: Family Medicine | Admitting: Family Medicine

## 2018-06-28 VITALS — BP 90/60 | HR 110 | Resp 14 | Ht 65.0 in | Wt 247.0 lb

## 2018-06-28 DIAGNOSIS — Z0000 Encounter for general adult medical examination without abnormal findings: Secondary | ICD-10-CM | POA: Diagnosis not present

## 2018-06-28 DIAGNOSIS — R000 Tachycardia, unspecified: Secondary | ICD-10-CM | POA: Diagnosis not present

## 2018-06-28 DIAGNOSIS — K219 Gastro-esophageal reflux disease without esophagitis: Secondary | ICD-10-CM

## 2018-06-28 DIAGNOSIS — E785 Hyperlipidemia, unspecified: Secondary | ICD-10-CM | POA: Diagnosis not present

## 2018-06-28 DIAGNOSIS — I10 Essential (primary) hypertension: Secondary | ICD-10-CM | POA: Diagnosis not present

## 2018-06-28 MED ORDER — LORATADINE 10 MG PO TABS: 10.0000 mg | ORAL_TABLET | Freq: Every day | ORAL | 5 refills | Status: DC

## 2018-06-28 MED ORDER — BENAZEPRIL HCL 20 MG PO TABS: 20.0000 mg | ORAL_TABLET | Freq: Every day | ORAL | 5 refills | Status: DC

## 2018-06-28 MED ORDER — OMEPRAZOLE 20 MG PO CPDR: 40.0000 mg | DELAYED_RELEASE_CAPSULE | Freq: Every day | ORAL | 5 refills | Status: DC

## 2018-06-28 MED ORDER — ROSUVASTATIN CALCIUM 40 MG PO TABS: 40.0000 mg | ORAL_TABLET | Freq: Every day | ORAL | 1 refills | Status: DC

## 2018-06-28 NOTE — Patient Instructions (Addendum)
F/U in 8 weeks, call if you need me before  Please get fasting lipid, cmp and EGFr 5 days before visit  Ensure you drink at least 64 oz water daily  EKG is good  We will verify your blood pressure medication since does not correspond and get back to you  Stop HCTZ and  Continue benazepril

## 2018-06-29 ENCOUNTER — Telehealth: Payer: Self-pay | Admitting: *Deleted

## 2018-06-29 NOTE — Telephone Encounter (Signed)
Georgina Peer (home health) went out to see [name] and had some concerns about her medications. Would like a call back and there were some medication changes with other drs. Can be reached at [phone].

## 2018-06-30 ENCOUNTER — Other Ambulatory Visit: Payer: Self-pay

## 2018-06-30 LAB — CYTOLOGY - PAP
Diagnosis: NEGATIVE
HPV: NOT DETECTED

## 2018-06-30 NOTE — Telephone Encounter (Signed)
Spoke with [name] and will confirm with dr s what she needs to take

## 2018-06-30 NOTE — Telephone Encounter (Signed)
Med list updated and correct

## 2018-07-04 ENCOUNTER — Encounter: Payer: Self-pay | Admitting: Family Medicine

## 2018-07-04 DIAGNOSIS — R000 Tachycardia, unspecified: Secondary | ICD-10-CM | POA: Insufficient documentation

## 2018-07-04 NOTE — Assessment & Plan Note (Signed)
Overcorrected, d/c benazepril / hctz and start benazepril only, need to verify what pt is actually taking as medication on lis does not correspond with what she has brought to the visit

## 2018-07-04 NOTE — Assessment & Plan Note (Signed)
EKG shows sinus tachycardia rate 105, no ischemia or LVH

## 2018-07-04 NOTE — Progress Notes (Signed)
    [name]     MRN: [id number]      DOB: Jun 30, 1955  HPI: Patient is in for annual physical exam. Noted to be hypotensive and tachycardic Recent labs, if available are reviewed. Immunization is reviewed , and  updated if needed.   PE: BP 90/60   Pulse (!) 110   Resp 14   Ht 5\' 5"  (1.651 m)   Wt 247 lb (112 kg)   BMI 41.10 kg/m   Pleasant  female, alert and oriented x 3, in no cardio-pulmonary distress. Afebrile. HEENT No facial trauma or asymetry. Sinuses non tender.  Extra occullar muscles intact,External ears normal, tympanic membranes clear. Oropharynx moist, no exudate. Neck: supple, no adenopathy,JVD or thyromegaly.No bruits.  Chest: Clear to ascultation bilaterally.No crackles or wheezes. Non tender to palpation  Breast: No asymetry,no masses or lumps. No tenderness. No nipple discharge or inversion. No axillary or supraclavicular adenopathy  Cardiovascular system; Heart sounds normal,  S1 and  S2 ,no S3.  No murmur, or thrill. Apical beat not displaced Peripheral pulses normal.  Abdomen: Soft, non tender, no organomegaly or masses. No bruits. Bowel sounds normal. No guarding, tenderness or rebound.   GU: External genitalia normal female genitalia , normal female distribution of hair. No lesions. Urethral meatus normal in size, no  Prolapse, no lesions visibly  Present. Bladder non tender. Vagina pink and moist , with no visible lesions , discharge present . Adequate pelvic support no  cystocele or rectocele noted  Uterus absent, no adnexal masses, no  adnexal tenderness.   Musculoskeletal exam: Full ROM of spine, hips , shoulders and knees. No deformity ,swelling or crepitus noted. No muscle wasting or atrophy.   Neurologic: Cranial nerves 2 to 12 intact. Power, tone ,sensation and reflexes normal throughout. No disturbance in gait. No tremor.  Skin: Intact, no ulceration, erythema , scaling or rash noted. Pigmentation normal  throughout  Psych; Normal mood and affect. Judgement and concentration normal   Assessment & Plan:  Annual physical exam Annual exam as documented. Counseling done  re healthy lifestyle involving commitment to 150 minutes exercise per week, heart healthy diet, and attaining healthy weight.The importance of adequate sleep also discussed.  Immunization and cancer screening needs are specifically addressed at this visit.   Essential hypertension Overcorrected, d/c benazepril / hctz and start benazepril only, need to verify what pt is actually taking as medication on lis does not correspond with what she has brought to the visit  Tachycardia EKG shows sinus tachycardia rate 105, no ischemia or LVH

## 2018-07-04 NOTE — Assessment & Plan Note (Signed)
Annual exam as documented. Counseling done  re healthy lifestyle involving commitment to 150 minutes exercise per week, heart healthy diet, and attaining healthy weight.The importance of adequate sleep also discussed.  Immunization and cancer screening needs are specifically addressed at this visit.  

## 2018-08-11 ENCOUNTER — Encounter: Payer: Medicaid Other | Admitting: Family Medicine

## 2018-08-19 ENCOUNTER — Other Ambulatory Visit (HOSPITAL_COMMUNITY): Payer: Self-pay | Admitting: Family Medicine

## 2018-08-19 DIAGNOSIS — Z1231 Encounter for screening mammogram for malignant neoplasm of breast: Secondary | ICD-10-CM

## 2018-08-20 DIAGNOSIS — F78 Other intellectual disabilities: Secondary | ICD-10-CM | POA: Diagnosis not present

## 2018-08-20 DIAGNOSIS — R569 Unspecified convulsions: Secondary | ICD-10-CM | POA: Diagnosis not present

## 2018-08-20 DIAGNOSIS — I10 Essential (primary) hypertension: Secondary | ICD-10-CM | POA: Diagnosis not present

## 2018-08-25 ENCOUNTER — Ambulatory Visit: Payer: Medicaid Other | Admitting: Family Medicine

## 2018-08-25 LAB — COMPLETE METABOLIC PANEL WITH GFR
AG Ratio: 1 (calc) (ref 1.0–2.5)
ALT: 9 U/L (ref 6–29)
AST: 19 U/L (ref 10–35)
Albumin: 4 g/dL (ref 3.6–5.1)
Alkaline phosphatase (APISO): 110 U/L (ref 37–153)
BUN/Creatinine Ratio: 18 (calc) (ref 6–22)
BUN: 34 mg/dL — ABNORMAL HIGH (ref 7–25)
CO2: 24 mmol/L (ref 20–32)
Calcium: 9.7 mg/dL (ref 8.6–10.4)
Chloride: 106 mmol/L (ref 98–110)
Creat: 1.87 mg/dL — ABNORMAL HIGH (ref 0.50–0.99)
GFR, Est African American: 32 mL/min/{1.73_m2} — ABNORMAL LOW (ref >=60)
GFR, Est Non African American: 28 mL/min/{1.73_m2} — ABNORMAL LOW (ref >=60)
Globulin: 3.9 g/dL (calc) — ABNORMAL HIGH (ref 1.9–3.7)
Glucose, Bld: 92 mg/dL (ref 65–99)
Potassium: 4.3 mmol/L (ref 3.5–5.3)
Sodium: 141 mmol/L (ref 135–146)
Total Bilirubin: 0.6 mg/dL (ref 0.2–1.2)
Total Protein: 7.9 g/dL (ref 6.1–8.1)

## 2018-08-25 LAB — LIPID PANEL
Cholesterol: 219 mg/dL — ABNORMAL HIGH (ref <200)
HDL: 54 mg/dL (ref >=50)
LDL Cholesterol (Calc): 150 mg/dL (calc) — ABNORMAL HIGH
Non-HDL Cholesterol (Calc): 165 mg/dL (calc) — ABNORMAL HIGH (ref <130)
Total CHOL/HDL Ratio: 4.1 (calc) (ref <5.0)
Triglycerides: 59 mg/dL (ref <150)

## 2018-08-30 ENCOUNTER — Other Ambulatory Visit: Payer: Self-pay

## 2018-08-30 ENCOUNTER — Encounter: Payer: Self-pay | Admitting: Family Medicine

## 2018-08-30 ENCOUNTER — Ambulatory Visit (INDEPENDENT_AMBULATORY_CARE_PROVIDER_SITE_OTHER): Payer: Medicaid Other | Admitting: Family Medicine

## 2018-08-30 VITALS — BP 150/90 | HR 89 | Temp 99.2°F | Ht 65.0 in | Wt 248.1 lb

## 2018-08-30 DIAGNOSIS — E785 Hyperlipidemia, unspecified: Secondary | ICD-10-CM | POA: Diagnosis not present

## 2018-08-30 DIAGNOSIS — G40909 Epilepsy, unspecified, not intractable, without status epilepticus: Secondary | ICD-10-CM

## 2018-08-30 DIAGNOSIS — K219 Gastro-esophageal reflux disease without esophagitis: Secondary | ICD-10-CM

## 2018-08-30 DIAGNOSIS — I10 Essential (primary) hypertension: Secondary | ICD-10-CM

## 2018-08-30 MED ORDER — AMLODIPINE BESYLATE 5 MG PO TABS: 5.0000 mg | ORAL_TABLET | Freq: Every day | ORAL | 5 refills | Status: DC

## 2018-08-30 MED ORDER — OMEPRAZOLE 20 MG PO CPDR: 20.0000 mg | DELAYED_RELEASE_CAPSULE | Freq: Every day | ORAL | 5 refills | Status: DC

## 2018-08-30 MED ORDER — EZETIMIBE 10 MG PO TABS: 10.0000 mg | ORAL_TABLET | Freq: Every day | ORAL | 5 refills | Status: DC

## 2018-08-30 NOTE — Assessment & Plan Note (Signed)
Reduce PPI dose and encouraged to stop caffeine and eATING LATE AT NIGHT

## 2018-08-30 NOTE — Progress Notes (Signed)
[name]     MRN: [id number]      DOB: 1955-12-10   HPI [name] is here for follow up and re-evaluation of chronic medical conditions, medication management and review of any available recent lab and radiology data.  Preventive health is updated, specifically  Cancer screening and Immunization.   Has upcoming appointment with Neurology, and reports 2 seizures since starting lamictal The PT denies any adverse reactions to current medications since the last visit.    ROS Denies recent fever or chills. Denies sinus pressure, nasal congestion, ear pain or sore throat. Denies chest congestion, productive cough or wheezing. Denies chest pains, palpitations and leg swelling Denies abdominal pain, nausea, vomiting,diarrhea or constipation.   Denies dysuria, frequency, hesitancy or incontinence. Denies uncontrolled  joint pain, swelling and limitation in mobility. Denies headaches,  numbness, or tingling. Denies depression, anxiety or insomnia. Denies skin break down or rash.   PE  BP (!) 150/90   Pulse 89   Temp 99.2 F (37.3 C)   Ht 5\' 5"  (1.651 m)   Wt 248 lb 1.9 oz (112.5 kg)   SpO2 99%   BMI 41.29 kg/m   Patient alert and oriented and in no cardiopulmonary distress.  HEENT: No facial asymmetry, EOMI,   oropharynx pink and moist.  Neck supple no JVD, no mass.  Chest: Clear to auscultation bilaterally.  CVS: S1, S2 no murmurs, no S3.Regular rate.  ABD: Soft non tender.   Ext: No edema  MS: Adequate ROM spine, shoulders, hips and knees.  Skin: Intact, no ulcerations or rash noted.  Psych: Good eye contact, normal affect. Memory intact not anxious or depressed appearing.  CNS: CN 2-12 intact, power,  normal throughout.no focal deficits noted.   Assessment & Plan  Essential hypertension Uncontrolled asdd amlodipine 5  Mg DASH diet and commitment to daily physical activity for a minimum of 30 minutes discussed and encouraged, as a part of hypertension  management. The importance of attaining a healthy weight is also discussed.  BP/Weight 08/30/2018 06/28/2018 04/29/2018 04/13/2018 04/08/2018 04/06/2018 2/72/5366  Systolic BP 440 90 347 99 425 956 387  Diastolic BP 90 60 90 78 80 66 72  Wt. (Lbs) 248.12 247 247.04 249 256.2 263.23 249  BMI 41.29 41.1 41.11 41.44 42.63 43.8 41.44       Hyperlipidemia LDL goal <100 Hyperlipidemia:Low fat diet discussed and encouraged.   Lipid Panel  Lab Results  Component Value Date   CHOL 219 (H) 08/24/2018   HDL 54 08/24/2018   LDLCALC 150 (H) 08/24/2018   TRIG 59 08/24/2018   CHOLHDL 4.1 08/24/2018   Uncontrolled, lower fat intake and add zetia    Obesity, Class III, BMI 40-49.9 (morbid obesity) ([place])  Patient re-educated about  the importance of commitment to a  minimum of 150 minutes of exercise per week as able.  The importance of healthy food choices with portion control discussed, as well as eating regularly and within a 12 hour window most days. The need to choose "clean , green" food 50 to 75% of the time is discussed, as well as to make water the primary drink and set a goal of 64 ounces water daily.    Weight /BMI 08/30/2018 06/28/2018 04/29/2018  WEIGHT 248 lb 1.9 oz 247 lb 247 lb 0.6 oz  HEIGHT 5\' 5"  5\' 5"  5\' 5"   BMI 41.29 kg/m2 41.1 kg/m2 41.11 kg/m2      Seizure disorder (HCC) Uncontrolled ,has appt with Neurology upcoming, will  need med adjustment,  Current bruise x 1 day on posterior lower chest where she had a seizure in recent past, unsure when, unwitnessed and no incontinence  GERD (gastroesophageal reflux disease) Reduce PPI dose and encouraged to stop caffeine and eATING LATE AT NIGHT

## 2018-08-30 NOTE — Assessment & Plan Note (Signed)
Uncontrolled asdd amlodipine 5  Mg DASH diet and commitment to daily physical activity for a minimum of 30 minutes discussed and encouraged, as a part of hypertension management. The importance of attaining a healthy weight is also discussed.  BP/Weight 08/30/2018 06/28/2018 04/29/2018 04/13/2018 04/08/2018 04/06/2018 1/42/7670  Systolic BP 110 90 034 99 961 164 353  Diastolic BP 90 60 90 78 80 66 72  Wt. (Lbs) 248.12 247 247.04 249 256.2 263.23 249  BMI 41.29 41.1 41.11 41.44 42.63 43.8 41.44

## 2018-08-30 NOTE — Patient Instructions (Signed)
F/u with MD to re evaluate blood pressure and cholesterol in 13 weeks, call if you need me sooner  NEW additional medication for blood pressure , which is high, is amlodipine 5 mg one daily, continue benazepril as before  Increase vegetable and fruits and reduce canned foods. Continue to work on weight loss  New additional medication for cholesterol is zetia 10 mg , continue the crestor 40 mg as before, and please reduce fried and fatty foods  Fasting lipid, cmp and eGFR 5 days before follow up in October  Thanks for choosing [facility], we consider it a privelige to serve you. Social distancing. Frequent hand washing with soap and water Keeping your hands off of your face. These 3 practices will help to keep both you and your community healthy during this time. Please practice them faithfully!  

## 2018-08-30 NOTE — Assessment & Plan Note (Signed)
Uncontrolled ,has appt with Neurology upcoming, will need med adjustment,  Current bruise x 1 day on posterior lower chest where she had a seizure in recent past, unsure when, unwitnessed and no incontinence

## 2018-08-30 NOTE — Assessment & Plan Note (Signed)
Hyperlipidemia:Low fat diet discussed and encouraged.   Lipid Panel  Lab Results  Component Value Date   CHOL 219 (H) 08/24/2018   HDL 54 08/24/2018   LDLCALC 150 (H) 08/24/2018   TRIG 59 08/24/2018   CHOLHDL 4.1 08/24/2018   Uncontrolled, lower fat intake and add zetia

## 2018-08-30 NOTE — Assessment & Plan Note (Signed)
  Patient re-educated about  the importance of commitment to a  minimum of 150 minutes of exercise per week as able.  The importance of healthy food choices with portion control discussed, as well as eating regularly and within a 12 hour window most days. The need to choose "clean , green" food 50 to 75% of the time is discussed, as well as to make water the primary drink and set a goal of 64 ounces water daily.    Weight /BMI 08/30/2018 06/28/2018 04/29/2018  WEIGHT 248 lb 1.9 oz 247 lb 247 lb 0.6 oz  HEIGHT 5\' 5"  5\' 5"  5\' 5"   BMI 41.29 kg/m2 41.1 kg/m2 41.11 kg/m2

## 2018-09-01 ENCOUNTER — Encounter (HOSPITAL_COMMUNITY): Admission: RE | Admit: 2018-09-01 | Payer: Medicaid Other | Source: Ambulatory Visit

## 2018-09-02 ENCOUNTER — Encounter (HOSPITAL_COMMUNITY): Payer: Medicaid Other

## 2018-09-03 ENCOUNTER — Encounter (HOSPITAL_COMMUNITY): Payer: Medicaid Other

## 2018-09-06 ENCOUNTER — Encounter (HOSPITAL_COMMUNITY): Payer: Medicaid Other

## 2018-09-10 ENCOUNTER — Telehealth: Payer: Self-pay | Admitting: *Deleted

## 2018-09-10 NOTE — Telephone Encounter (Signed)
[name] with [facility] called just wanted to let Dr. Moshe Cipro know that she went to see [place] and her BP was 146/90 she did not get her amlodopine from pharmacy so she will start that on Monday. Also her calcitrol was changed by kidney dr to everyday instead of Monday Wednesday and Friday

## 2018-09-11 NOTE — Telephone Encounter (Signed)
noted 

## 2018-09-22 ENCOUNTER — Encounter (HOSPITAL_COMMUNITY): Payer: Self-pay

## 2018-09-22 ENCOUNTER — Other Ambulatory Visit: Payer: Self-pay

## 2018-09-22 ENCOUNTER — Encounter (HOSPITAL_COMMUNITY)
Admission: RE | Admit: 2018-09-22 | Discharge: 2018-09-22 | Disposition: A | Discharge status: Home / Self Care | Payer: Medicaid Other | Source: Ambulatory Visit | Attending: "Endocrinology | Admitting: "Endocrinology

## 2018-09-22 DIAGNOSIS — C73 Malignant neoplasm of thyroid gland: Secondary | ICD-10-CM | POA: Insufficient documentation

## 2018-09-22 MED ORDER — STERILE WATER FOR INJECTION IJ SOLN
INTRAMUSCULAR | Status: AC
  Administered 2018-09-22: 5 mL
  Filled 2018-09-22: qty 10

## 2018-09-22 MED ORDER — THYROTROPIN ALFA 1.1 MG IM SOLR
INTRAMUSCULAR | Status: AC
  Administered 2018-09-22: 0.9 mg via INTRAMUSCULAR
  Filled 2018-09-22: qty 0.9

## 2018-09-22 MED ORDER — THYROTROPIN ALFA 1.1 MG IM SOLR
0.9000 mg | INTRAMUSCULAR | Status: AC
  Administered 2018-09-22: 0.9 mg via INTRAMUSCULAR

## 2018-09-23 ENCOUNTER — Encounter (HOSPITAL_COMMUNITY)
Admission: RE | Admit: 2018-09-23 | Discharge: 2018-09-23 | Disposition: A | Discharge status: Home / Self Care | Payer: Medicaid Other | Source: Ambulatory Visit | Attending: "Endocrinology | Admitting: "Endocrinology

## 2018-09-23 DIAGNOSIS — C73 Malignant neoplasm of thyroid gland: Secondary | ICD-10-CM

## 2018-09-23 MED ORDER — THYROTROPIN ALFA 1.1 MG IM SOLR
0.9000 mg | INTRAMUSCULAR | Status: AC
  Administered 2018-09-23: 0.9 mg via INTRAMUSCULAR

## 2018-09-23 MED ORDER — STERILE WATER FOR INJECTION IJ SOLN
INTRAMUSCULAR | Status: AC
  Administered 2018-09-23: 5 mL
  Filled 2018-09-23: qty 10

## 2018-09-23 MED ORDER — THYROTROPIN ALFA 1.1 MG IM SOLR
INTRAMUSCULAR | Status: AC
  Filled 2018-09-23: qty 0.9

## 2018-09-24 ENCOUNTER — Encounter (HOSPITAL_COMMUNITY)
Admission: RE | Admit: 2018-09-24 | Discharge: 2018-09-24 | Disposition: A | Discharge status: Home / Self Care | Payer: Medicaid Other | Source: Ambulatory Visit | Attending: "Endocrinology | Admitting: "Endocrinology

## 2018-09-24 ENCOUNTER — Other Ambulatory Visit: Payer: Self-pay

## 2018-09-24 MED ORDER — SODIUM IODIDE I 131 CAPSULE
4.0000 | Freq: Once | INTRAVENOUS | Status: AC | PRN
  Administered 2018-09-24: 4.28 via ORAL

## 2018-09-27 ENCOUNTER — Encounter (HOSPITAL_COMMUNITY): Payer: Self-pay

## 2018-09-27 ENCOUNTER — Other Ambulatory Visit: Payer: Self-pay

## 2018-09-27 ENCOUNTER — Encounter (HOSPITAL_COMMUNITY)
Admission: RE | Admit: 2018-09-27 | Discharge: 2018-09-27 | Disposition: A | Discharge status: Home / Self Care | Payer: Medicaid Other | Source: Ambulatory Visit | Attending: "Endocrinology | Admitting: "Endocrinology

## 2018-10-04 ENCOUNTER — Ambulatory Visit (HOSPITAL_COMMUNITY): Payer: Medicaid Other

## 2018-10-05 ENCOUNTER — Ambulatory Visit: Payer: Medicaid Other | Admitting: "Endocrinology

## 2018-10-06 LAB — THYROGLOBULIN ANTIBODY: Thyroglobulin Ab: <1 IU/mL (ref <=1)

## 2018-10-06 LAB — TSH: TSH: 0.78 mIU/L (ref 0.40–4.50)

## 2018-10-06 LAB — T4, FREE: Free T4: 1.6 ng/dL (ref 0.8–1.8)

## 2018-10-06 LAB — THYROGLOBULIN LEVEL: Thyroglobulin: 1.7 ng/mL — ABNORMAL LOW

## 2018-10-19 ENCOUNTER — Ambulatory Visit (INDEPENDENT_AMBULATORY_CARE_PROVIDER_SITE_OTHER): Payer: Medicaid Other | Admitting: "Endocrinology

## 2018-10-19 ENCOUNTER — Encounter: Payer: Self-pay | Admitting: "Endocrinology

## 2018-10-19 ENCOUNTER — Other Ambulatory Visit: Payer: Self-pay

## 2018-10-19 DIAGNOSIS — E890 Postprocedural hypothyroidism: Secondary | ICD-10-CM

## 2018-10-19 DIAGNOSIS — C73 Malignant neoplasm of thyroid gland: Secondary | ICD-10-CM

## 2018-10-19 DIAGNOSIS — F78 Other intellectual disabilities: Secondary | ICD-10-CM | POA: Diagnosis not present

## 2018-10-19 DIAGNOSIS — I10 Essential (primary) hypertension: Secondary | ICD-10-CM | POA: Diagnosis not present

## 2018-10-19 DIAGNOSIS — R569 Unspecified convulsions: Secondary | ICD-10-CM | POA: Diagnosis not present

## 2018-10-19 NOTE — Progress Notes (Signed)
10/19/2018                                Endocrinology Telehealth Visit Follow up Note -During COVID -19 Pandemic  I connected with [name] on 10/19/2018   by telephone and verified that I am speaking with the correct person using two identifiers. Gabryel Talamo Ivancic, November 09, 1955. she has verbally consented to this visit. All issues noted in this document were discussed and addressed. The format was not optimal for physical exam.    Subjective:    Patient ID: [name], female    DOB: Nov 05, 1955,    Past Medical History:  Diagnosis Date  . Depression   . Fractures   . Hyperlipidemia   . Hypertension   . Mental retardation, mild (I.Q. 50-70)   . Obesity   . Seizure ([place])    11/17/2012 "still having them; "I believe their from a tumor in my head" ((11/17/2012  . Seizure disorder ([place])   . Thyroid cancer [facility])    Past Surgical History:  Procedure Laterality Date  . ABDOMINAL HYSTERECTOMY  2001  . BREAST LUMPECTOMY  1996   right   . COLONOSCOPY N/A 05/16/2016   Procedure: COLONOSCOPY;  Surgeon: Danie Binder, MD;  Location: [facility];  Service: Endoscopy;  Laterality: N/A;  830   . THYROIDECTOMY Bilateral 11/17/2012   Procedure: TOTAL THYROIDECTOMY;  Surgeon: Ascencion Dike, MD;  Location: [place];  Service: ENT;  Laterality: Bilateral;  . TOTAL THYROIDECTOMY Bilateral 11/17/2012  . TUBAL LIGATION     Social History   Socioeconomic History  . Marital status: Single    Spouse name: Not on file  . Number of children: 2  . Years of education: Not on file  . Highest education level: Not on file  Occupational History  . Occupation: disabled   Social Needs  . Financial resource strain: Not on file  . Food insecurity    Worry: Not on file    Inability: Not on file  . Transportation needs    Medical: Not on file    Non-medical: Not on file  Tobacco Use  . Smoking status: Never Smoker  . Smokeless tobacco: Never Used  Substance and Sexual Activity  . Alcohol use:  No  . Drug use: No  . Sexual activity: Never  Lifestyle  . Physical activity    Days per week: Not on file    Minutes per session: Not on file  . Stress: Not on file  Relationships  . Social Herbalist on phone: Not on file    Gets together: Not on file    Attends religious service: Not on file    Active member of club or organization: Not on file    Attends meetings of clubs or organizations: Not on file    Relationship status: Not on file  Other Topics Concern  . Not on file  Social History Narrative  . Not on file   Outpatient Encounter Medications as of 10/19/2018  Medication Sig  . acetaminophen (TYLENOL) 500 MG tablet Take 1 tablet (500 mg total) by mouth 2 (two) times daily.  Marland Kitchen amLODipine (NORVASC) 5 MG tablet Take 1 tablet (5 mg total) by mouth daily.  . benazepril (LOTENSIN) 20 MG tablet Take 1 tablet (20 mg total) by mouth daily.  . calcitRIOL (ROCALTROL) 0.25 MCG capsule Take 0.25 mcg by mouth every Monday,  Wednesday, and Friday. \  . ezetimibe (ZETIA) 10 MG tablet Take 1 tablet (10 mg total) by mouth daily.  Marland Kitchen lamoTRIgine (LAMICTAL) 150 MG tablet Take 1 tablet (150 mg total) by mouth 2 (two) times daily.  Marland Kitchen levETIRAcetam (KEPPRA) 750 MG tablet Two tablets twice daily (Patient taking differently: Take 1,500 mg by mouth 2 (two) times daily. )  . levothyroxine (SYNTHROID, LEVOTHROID) 137 MCG tablet Take 1 tablet (137 mcg total) by mouth daily before breakfast.  . loratadine (CLARITIN) 10 MG tablet Take 1 tablet (10 mg total) by mouth daily.  Marland Kitchen omeprazole (PRILOSEC) 20 MG capsule Take 1 capsule (20 mg total) by mouth daily.  . rosuvastatin (CRESTOR) 40 MG tablet Take 1 tablet (40 mg total) by mouth daily.   No facility-administered encounter medications on file as of 10/19/2018.    ALLERGIES: Allergies  Allergen Reactions  . Simvastatin Other (See Comments)    Muscle aches   VACCINATION STATUS: Immunization History  Administered Date(s) Administered  .  H1N1 12/16/2007  . Influenza Split 11/24/2011  . Influenza Whole 11/30/2006, 12/07/2008, 12/26/2009  . Influenza,inj,Quad PF,6+ Mos 11/18/2012, 12/06/2013, 05/23/2015, 01/29/2016, 12/01/2016, 01/18/2018  . Pneumococcal Conjugate-13 04/03/2014  . Td 08/22/2003  . Tdap 04/02/2016    HPI  63 yr old female with medical hx as above . She is here to follow-up for post surgical hypothyroidism and for follow-up of Papillary  thyroid cancer. 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 She has had  Thyrogen stimulated remnant ablation with negative post therapy WBS in 2014.  - Second Thyrogen Stimulated whole body scan for surveillance shows no scintigraphic evidence of iodine avid metastasis papillary thyroid cancer on 04/18/2016.   -Most recent surveillance imaging with Thyrogen stimulated whole-body scan on September 27, 2018 showed no evidence of tumor recurrence.  She is now on Synthroid 137 g by mouth every morning.   -She is compliant  to her thyroid hormone ,denies any new complaints.   she denies palpitations. she denies dysphagia, SOB, nor voice change. she denies family history of thyroid cancer.  Review of systems: Limited as above.  Objective:    There were no vitals taken for this visit.  Wt Readings from Last 3 Encounters:  08/30/18 248 lb 1.9 oz (112.5 kg)  07/04/18 247 lb (112 kg)  04/29/18 247 lb 0.6 oz (112.1 kg)      Results for orders placed or performed in visit on 06/28/18  Lipid panel  Result Value Ref Range   Cholesterol 219 (H) <200 mg/dL   HDL 54 > OR = 50 mg/dL   Triglycerides 59 <150 mg/dL   LDL Cholesterol (Calc) 150 (H) mg/dL (calc)   Total CHOL/HDL Ratio 4.1 <5.0 (calc)   Non-HDL Cholesterol (Calc) 165 (H) <130 mg/dL (calc)  COMPLETE METABOLIC PANEL WITH GFR  Result Value Ref Range   Glucose, Bld 92 65 - 99 mg/dL   BUN 34  (H) 7 - 25 mg/dL   Creat 1.87 (H) 0.50 - 0.99 mg/dL   GFR, Est Non African American 28 (L) > OR = 60 mL/min/1.68m2   GFR, Est African American 32 (L) > OR = 60 mL/min/1.28m2   BUN/Creatinine Ratio 18 6 - 22 (calc)   Sodium 141 135 - 146 mmol/L   Potassium 4.3 3.5 - 5.3 mmol/L   Chloride 106 98 - 110 mmol/L   CO2 24 20 - 32 mmol/L  Calcium 9.7 8.6 - 10.4 mg/dL   Total Protein 7.9 6.1 - 8.1 g/dL   Albumin 4.0 3.6 - 5.1 g/dL   Globulin 3.9 (H) 1.9 - 3.7 g/dL (calc)   AG Ratio 1.0 1.0 - 2.5 (calc)   Total Bilirubin 0.6 0.2 - 1.2 mg/dL   Alkaline phosphatase (APISO) 110 37 - 153 U/L   AST 19 10 - 35 U/L   ALT 9 6 - 29 U/L  Cytology - PAP  Result Value Ref Range   Adequacy Satisfactory for evaluation.    Diagnosis      NEGATIVE FOR INTRAEPITHELIAL LESIONS OR MALIGNANCY.   HPV NOT DETECTED    Material Submitted Vaginal Pap [ThinPrep Imaged]    Complete Blood Count (Most recent): Lab Results  Component Value Date   WBC 5.6 04/13/2018   HGB 11.1 (L) 04/13/2018   HCT 34.3 (L) 04/13/2018   MCV 84.3 04/13/2018   PLT 299 04/13/2018   Diabetic Labs (most recent): Lab Results  Component Value Date   HGBA1C 5.2 05/26/2017   HGBA1C 5.3 08/10/2012   Lipid Panel     Component Value Date/Time   CHOL 219 (H) 08/24/2018 0846   CHOL 231 (H) 06/18/2016 0954   TRIG 59 08/24/2018 0846   HDL 54 08/24/2018 0846   HDL 46 06/18/2016 0954   CHOLHDL 4.1 08/24/2018 0846   VLDL 14 01/29/2016 1109   LDLCALC 150 (H) 08/24/2018 0846    June 01, 2017  IMPRESSION:  Questioned approximately 2.2 cm soft tissue within the right lobectomy resection bed appears similar to the 07/2015 examination.  Note, while nonspecific, this tissue was apparently present at the time of the Thyrogen stimulated I 131 nuclear medicine whole body scan performed 03/2016 which was negative for the presence of iodine avid metastatic thyroid cancer or residual disease, and thus favored to be benign etiology. Clinical  correlation is advised.  Electronically Signed   By: Sandi Mariscal M.D.   On: 06/01/2017 11:38   Thyrogen stimulated whole-body scan September 27, 2018 fINDINGS:  Excreted tracer within colon and urinary bladder. Small amount of retained gastric activity. Pattern is similar to that seen on the previous exam.  No sites of iodine-avid metastatic thyroid cancer identified. No residual tracer accumulation in the neck.  IMPRESSION: No scintigraphic evidence of iodine-avid metastatic thyroid cancer.   Assessment & Plan:   1. Hypothyroidism, postsurgical -Her repeat thyroid function tests are consistent with appropriate replacement.   -She is advised to continue with her current dose of levothyroxine 137 mcg p.o. daily before breakfast.   - We discussed about the correct intake of her thyroid hormone, on empty stomach at fasting, with water, separated by at least 30 minutes from breakfast and other medications,  and separated by more than 4 hours from calcium, iron, multivitamins, acid reflux medications (PPIs). -Patient is made aware of the fact that thyroid hormone replacement is needed for life, dose to be adjusted by periodic monitoring of thyroid function tests.   - Target TSH for her would be between 0.1-0.5.  2. Papillary thyroid carcinoma ([place]) Her  surveillance neck /thyroid u/s from 11/17/2013 was c/w surgically absent thyroid, however there is a 0.9 cm left cervical lymph node. See below.  Pt is s/p thyrogen stimulated thyroid remnant ablation with WBS on 01/14/2013 with no evidence of iodine avid metastasis. She has had pT2,Nx,Mx FVPTC, s/p near total thyroidectomy on September 24,2014. She is counseled to maintain high degree of compliance with synthroid therapy with aim  of keeping TSH low normal ( 0.1-0.5).  -she is advised on the necessity of follow-ups and imaging studies at least one annually for the next 5 years. - She was sent for core biopsy of 0.9 cm left cervical  lymph node last visit. However, this procedure was abandoned due to " no evidence of adenopathy".  The previously noticed  left cervical lymph node was sent to decrease in size to 0.5 cm. Medicaid did not cover surveillance ultrasound. - Second Thyrogen estimated or body scan for surveillance shows no scintigraphic evidence of iodine avid metastasis papillary thyroid cancer on 04/18/2016. -June 01, 2017 surveillance thyroid/neck ultrasound : Unremarkable, see above.   -She underwent  Thyrogen stimulated whole body scan which is negative for tumor recurrence.  She will not need any further intervention at this time.    She is advised to keep close follow-up with Dr. Tula Nakayama for primary care needs.   Time for this visit: 15 minutes. Delrae Alfred Kenealy  participated in the discussions, expressed understanding, and voiced agreement with the above plans.  All questions were answered to her satisfaction. she is encouraged to contact clinic should she have any questions or concerns prior to her return visit.  Follow up plan: Return in about 6 months (around 04/21/2019) for Follow up with Pre-visit Labs.  Glade Lloyd, MD Phone: [phone]  Fax: [phone]  -  This note was partially dictated with voice recognition software. Similar sounding words can be transcribed inadequately or may not  be corrected upon review.   10/19/2018, 2:59 PM

## 2018-10-26 ENCOUNTER — Other Ambulatory Visit: Payer: Self-pay | Admitting: "Endocrinology

## 2018-11-27 LAB — COMPLETE METABOLIC PANEL WITH GFR
AG Ratio: 1.1 (calc) (ref 1.0–2.5)
ALT: 13 U/L (ref 6–29)
AST: 17 U/L (ref 10–35)
Albumin: 4.1 g/dL (ref 3.6–5.1)
Alkaline phosphatase (APISO): 107 U/L (ref 37–153)
BUN/Creatinine Ratio: 16 (calc) (ref 6–22)
BUN: 27 mg/dL — ABNORMAL HIGH (ref 7–25)
CO2: 24 mmol/L (ref 20–32)
Calcium: 10 mg/dL (ref 8.6–10.4)
Chloride: 105 mmol/L (ref 98–110)
Creat: 1.71 mg/dL — ABNORMAL HIGH (ref 0.50–0.99)
GFR, Est African American: 36 mL/min/{1.73_m2} — ABNORMAL LOW (ref >=60)
GFR, Est Non African American: 31 mL/min/{1.73_m2} — ABNORMAL LOW (ref >=60)
Globulin: 3.9 g/dL (calc) — ABNORMAL HIGH (ref 1.9–3.7)
Glucose, Bld: 90 mg/dL (ref 65–99)
Potassium: 4.2 mmol/L (ref 3.5–5.3)
Sodium: 140 mmol/L (ref 135–146)
Total Bilirubin: 0.5 mg/dL (ref 0.2–1.2)
Total Protein: 8 g/dL (ref 6.1–8.1)

## 2018-11-27 LAB — LIPID PANEL
Cholesterol: 150 mg/dL (ref <200)
HDL: 52 mg/dL (ref >=50)
LDL Cholesterol (Calc): 85 mg/dL (calc)
Non-HDL Cholesterol (Calc): 98 mg/dL (calc) (ref <130)
Total CHOL/HDL Ratio: 2.9 (calc) (ref <5.0)
Triglycerides: 58 mg/dL (ref <150)

## 2018-12-01 ENCOUNTER — Ambulatory Visit (INDEPENDENT_AMBULATORY_CARE_PROVIDER_SITE_OTHER): Payer: Medicaid Other | Admitting: Family Medicine

## 2018-12-01 ENCOUNTER — Other Ambulatory Visit: Payer: Self-pay

## 2018-12-01 ENCOUNTER — Encounter: Payer: Self-pay | Admitting: Family Medicine

## 2018-12-01 VITALS — BP 122/82 | HR 95 | Temp 97.7°F | Resp 15 | Ht 65.0 in | Wt 247.0 lb

## 2018-12-01 DIAGNOSIS — E785 Hyperlipidemia, unspecified: Secondary | ICD-10-CM | POA: Diagnosis not present

## 2018-12-01 DIAGNOSIS — Z23 Encounter for immunization: Secondary | ICD-10-CM

## 2018-12-01 DIAGNOSIS — G40909 Epilepsy, unspecified, not intractable, without status epilepticus: Secondary | ICD-10-CM

## 2018-12-01 DIAGNOSIS — F320 Major depressive disorder, single episode, mild: Secondary | ICD-10-CM

## 2018-12-01 DIAGNOSIS — I10 Essential (primary) hypertension: Secondary | ICD-10-CM | POA: Diagnosis not present

## 2018-12-01 NOTE — Patient Instructions (Addendum)
F/U in 4 months, with MD , call if you need me before  Please schedule mammogram at checkout, this is past due  Flu vaccine today  Excellent blood pressure continue same   I will reach out to social worker / caregiver to see how you  Can get into an assisted living facility/ family home , and get back to you or your daughter Guadalupe Dawn [phone]  Be safe ,  Wear mask and socially distant  Thanks for choosing [facility], we consider it a privelige to serve you.

## 2018-12-04 ENCOUNTER — Encounter: Payer: Self-pay | Admitting: Family Medicine

## 2018-12-04 NOTE — Progress Notes (Signed)
[name]     MRN: [id number]      DOB: 04/23/1955   HPI [name] is here for follow up and re-evaluation of chronic medical conditions, medication management and review of any available recent lab and radiology data.  Preventive health is updated, specifically  Cancer screening and Immunization.   Questions or concerns regarding consultations or procedures which the PT has had in the interim are  addressed. The PT denies any adverse reactions to current medications since the last visit.  C/o uncontrolled seizure activity, most recent 3 days ago, states she is incapable of caring for herself and living independently, wants to move to an assisted living facility or family home   ROS Denies recent fever or chills. Denies sinus pressure, nasal congestion, ear pain or sore throat. Denies chest congestion, productive cough or wheezing. Denies chest pains, palpitations and leg swelling Denies abdominal pain, nausea, vomiting,diarrhea or constipation.   Denies dysuria, frequency, hesitancy or incontinence. C/o chronic  joint pain, swelling and limitation in mobility. Denies headaches, , numbness, or tingling. Denies uncontrolled  depression, anxiety or insomnia. Denies skin break down or rash.   PE  BP 122/82   Pulse 95   Temp 97.7 F (36.5 C) (Temporal)   Resp 15   Ht 5\' 5"  (1.651 m)   Wt 247 lb (112 kg)   SpO2 98%   BMI 41.10 kg/m   Patient alert and oriented and in no cardiopulmonary distress.  HEENT: No facial asymmetry, EOMI,     Neck supple .  Chest: Clear to auscultation bilaterally.  CVS: S1, S2 no murmurs, no S3.Regular rate.  ABD: Soft non tender.   Ext: No edema  MS: Adequate though reduced ROM spine, shoulders, hips and knees.  Skin: Intact, no ulcerations or rash noted.  Psych: Good eye contact, normal affect. Memory impaired not anxious or depressed appearing.  CNS: CN 2-12 intact, power,  normal throughout.no focal deficits noted.    Assessment & Plan  Essential hypertension Controlled, no change in medication DASH diet and commitment to daily physical activity for a minimum of 30 minutes discussed and encouraged, as a part of hypertension management. The importance of attaining a healthy weight is also discussed.  BP/Weight 12/01/2018 08/30/2018 06/28/2018 04/29/2018 04/13/2018 04/08/2018 9/56/3875  Systolic BP 643 329 90 518 99 841 660  Diastolic BP 82 90 60 90 78 80 66  Wt. (Lbs) 247 248.12 247 247.04 249 256.2 263.23  BMI 41.1 41.29 41.1 41.11 41.44 42.63 43.8       Hyperlipidemia LDL goal <100 Hyperlipidemia:Low fat diet discussed and encouraged.   Lipid Panel  Lab Results  Component Value Date   CHOL 150 11/26/2018   HDL 52 11/26/2018   LDLCALC 85 11/26/2018   TRIG 58 11/26/2018   CHOLHDL 2.9 11/26/2018    Controlled, no change in medication    Morbid obesity ([place])  Patient re-educated about  the importance of commitment to a  minimum of 150 minutes of exercise per week as able.  The importance of healthy food choices with portion control discussed, as well as eating regularly and within a 12 hour window most days. The need to choose "clean , green" food 50 to 75% of the time is discussed, as well as to make water the primary drink and set a goal of 64 ounces water daily.    Weight /BMI 12/01/2018 08/30/2018 06/28/2018  WEIGHT 247 lb 248 lb 1.9 oz 247 lb  HEIGHT 5\' 5"  5'  5" 5\' 5"   BMI 41.1 kg/m2 41.29 kg/m2 41.1 kg/m2      Depression, major, single episode, mild (HCC) Resume fluoxetine , which has been beneficial in the past  Seizure disorder ([place]) Uncontrolled , with recent seizure activity, requesting placement , states no longer able to live independently, will rach out to facility

## 2018-12-05 MED ORDER — FLUOXETINE HCL 10 MG PO TABS: 10.0000 mg | ORAL_TABLET | Freq: Every day | ORAL | 5 refills | Status: DC

## 2018-12-05 NOTE — Assessment & Plan Note (Signed)
Controlled, no change in medication DASH diet and commitment to daily physical activity for a minimum of 30 minutes discussed and encouraged, as a part of hypertension management. The importance of attaining a healthy weight is also discussed.  BP/Weight 12/01/2018 08/30/2018 06/28/2018 04/29/2018 04/13/2018 04/08/2018 1/61/0960  Systolic BP 454 098 90 119 99 147 829  Diastolic BP 82 90 60 90 78 80 66  Wt. (Lbs) 247 248.12 247 247.04 249 256.2 263.23  BMI 41.1 41.29 41.1 41.11 41.44 42.63 43.8

## 2018-12-05 NOTE — Assessment & Plan Note (Signed)
Resume fluoxetine , which has been beneficial in the past

## 2018-12-05 NOTE — Assessment & Plan Note (Signed)
Hyperlipidemia:Low fat diet discussed and encouraged.   Lipid Panel  Lab Results  Component Value Date   CHOL 150 11/26/2018   HDL 52 11/26/2018   LDLCALC 85 11/26/2018   TRIG 58 11/26/2018   CHOLHDL 2.9 11/26/2018    Controlled, no change in medication

## 2018-12-05 NOTE — Assessment & Plan Note (Signed)
Uncontrolled , with recent seizure activity, requesting placement , states no longer able to live independently, will rach out to facility

## 2018-12-05 NOTE — Assessment & Plan Note (Signed)
  Patient re-educated about  the importance of commitment to a  minimum of 150 minutes of exercise per week as able.  The importance of healthy food choices with portion control discussed, as well as eating regularly and within a 12 hour window most days. The need to choose "clean , green" food 50 to 75% of the time is discussed, as well as to make water the primary drink and set a goal of 64 ounces water daily.    Weight /BMI 12/01/2018 08/30/2018 06/28/2018  WEIGHT 247 lb 248 lb 1.9 oz 247 lb  HEIGHT 5\' 5"  5\' 5"  5\' 5"   BMI 41.1 kg/m2 41.29 kg/m2 41.1 kg/m2

## 2018-12-15 DIAGNOSIS — I10 Essential (primary) hypertension: Secondary | ICD-10-CM | POA: Diagnosis not present

## 2018-12-15 DIAGNOSIS — F78 Other intellectual disabilities: Secondary | ICD-10-CM | POA: Diagnosis not present

## 2018-12-15 DIAGNOSIS — R569 Unspecified convulsions: Secondary | ICD-10-CM | POA: Diagnosis not present

## 2018-12-16 ENCOUNTER — Other Ambulatory Visit: Payer: Self-pay

## 2018-12-16 ENCOUNTER — Ambulatory Visit (HOSPITAL_COMMUNITY)
Admission: RE | Admit: 2018-12-16 | Discharge: 2018-12-16 | Disposition: A | Discharge status: Home / Self Care | Payer: Medicaid Other | Source: Ambulatory Visit | Attending: Family Medicine | Admitting: Family Medicine

## 2018-12-16 DIAGNOSIS — Z1231 Encounter for screening mammogram for malignant neoplasm of breast: Secondary | ICD-10-CM | POA: Diagnosis not present

## 2018-12-20 ENCOUNTER — Telehealth: Payer: Self-pay | Admitting: Family Medicine

## 2018-12-20 NOTE — Telephone Encounter (Signed)
Please call pt ask if interested in tele psych for her depression, I have a msg from [facility] offering her services.   let me know, thanks  ALSO she had mentioned wanting to live in a family home, stating unable to care for herself [facility] has a female bed, please contact pt let her know we have a facility to look into. She will need the help of her responsible party ( an adult  Child ) to assist with theprocess thanks

## 2018-12-21 NOTE — Telephone Encounter (Signed)
Not interested in tele psych for the depression.  Also aware of the info on family home and said she would think about it and call you back if she decides

## 2018-12-21 NOTE — Telephone Encounter (Signed)
noted 

## 2019-01-14 ENCOUNTER — Telehealth: Payer: Self-pay | Admitting: *Deleted

## 2019-01-14 NOTE — Telephone Encounter (Signed)
Pts daughter Elise Knobloch called wanting to know if it would be ok for the pt to take melatonin with all of her other meds as she is having trouble sleeping.

## 2019-01-14 NOTE — Telephone Encounter (Signed)
Yes she may 

## 2019-01-14 NOTE — Telephone Encounter (Signed)
Spoke with patients daughter and advised she could take Melatonin

## 2019-01-14 NOTE — Telephone Encounter (Signed)
Please advise 

## 2019-01-25 ENCOUNTER — Telehealth: Payer: Self-pay | Admitting: *Deleted

## 2019-01-25 NOTE — Telephone Encounter (Signed)
Apartment papers received copied noted sleeved

## 2019-01-26 ENCOUNTER — Other Ambulatory Visit: Payer: Self-pay | Admitting: "Endocrinology

## 2019-02-26 ENCOUNTER — Other Ambulatory Visit: Payer: Self-pay | Admitting: Family Medicine

## 2019-03-09 DIAGNOSIS — R569 Unspecified convulsions: Secondary | ICD-10-CM | POA: Diagnosis not present

## 2019-03-09 DIAGNOSIS — I10 Essential (primary) hypertension: Secondary | ICD-10-CM | POA: Diagnosis not present

## 2019-03-09 DIAGNOSIS — F78 Other intellectual disabilities: Secondary | ICD-10-CM | POA: Diagnosis not present

## 2019-03-15 ENCOUNTER — Other Ambulatory Visit: Payer: Self-pay | Admitting: Family Medicine

## 2019-03-15 DIAGNOSIS — E785 Hyperlipidemia, unspecified: Secondary | ICD-10-CM

## 2019-04-04 ENCOUNTER — Ambulatory Visit (INDEPENDENT_AMBULATORY_CARE_PROVIDER_SITE_OTHER): Payer: Medicare Other | Admitting: Family Medicine

## 2019-04-04 ENCOUNTER — Encounter: Payer: Self-pay | Admitting: Family Medicine

## 2019-04-04 ENCOUNTER — Other Ambulatory Visit: Payer: Self-pay

## 2019-04-04 VITALS — BP 138/80 | HR 88 | Temp 97.4°F | Resp 15 | Ht 65.0 in | Wt 238.0 lb

## 2019-04-04 DIAGNOSIS — G40909 Epilepsy, unspecified, not intractable, without status epilepticus: Secondary | ICD-10-CM

## 2019-04-04 DIAGNOSIS — E785 Hyperlipidemia, unspecified: Secondary | ICD-10-CM

## 2019-04-04 DIAGNOSIS — F320 Major depressive disorder, single episode, mild: Secondary | ICD-10-CM

## 2019-04-04 DIAGNOSIS — I10 Essential (primary) hypertension: Secondary | ICD-10-CM | POA: Diagnosis not present

## 2019-04-04 MED ORDER — FLUOXETINE HCL 10 MG PO CAPS: 10.0000 mg | ORAL_CAPSULE | Freq: Every day | ORAL | 5 refills | Status: DC

## 2019-04-04 MED ORDER — EZETIMIBE 10 MG PO TABS: 10.0000 mg | ORAL_TABLET | Freq: Every day | ORAL | 5 refills | Status: DC

## 2019-04-04 NOTE — Patient Instructions (Addendum)
Phone f/u with mD re sleep and anxiety in 5 weeks,  Thursday afternoon appt 3 or after, call if you  need me before  Please get lipid, cmp and EGFR,same day as you are getting lab for Dr Dorris Fetch, don't eat, start drinking 64 oz water every day  Resume fluoxetine so yoy remain calm and get more sleep

## 2019-04-06 ENCOUNTER — Encounter: Payer: Self-pay | Admitting: Family Medicine

## 2019-04-06 NOTE — Assessment & Plan Note (Signed)
Reports recent seizure activity despite medication, has neurology apppt today

## 2019-04-06 NOTE — Assessment & Plan Note (Signed)
Controlled, no change in medication DASH diet and commitment to daily physical activity for a minimum of 30 minutes discussed and encouraged, as a part of hypertension management. The importance of attaining a healthy weight is also discussed.  BP/Weight 04/04/2019 12/01/2018 08/30/2018 06/28/2018 04/29/2018 04/13/2018 8/61/6837  Systolic BP 290 211 155 90 208 99 022  Diastolic BP 80 82 90 60 90 78 80  Wt. (Lbs) 238 247 248.12 247 247.04 249 256.2  BMI 39.61 41.1 41.29 41.1 41.11 41.44 42.63

## 2019-04-06 NOTE — Assessment & Plan Note (Signed)
Untreated , and symptomatic, needs to resume fluoxetine, phone follow up

## 2019-04-06 NOTE — Assessment & Plan Note (Signed)
Obesity linked with hypertension and dewpression  Patient re-educated about  the importance of commitment to a  minimum of 150 minutes of exercise per week as able.  The importance of healthy food choices with portion control discussed, as well as eating regularly and within a 12 hour window most days. The need to choose "clean , green" food 50 to 75% of the time is discussed, as well as to make water the primary drink and set a goal of 64 ounces water daily.    Weight /BMI 04/04/2019 12/01/2018 08/30/2018  WEIGHT 238 lb 247 lb 248 lb 1.9 oz  HEIGHT 5\' 5"  5\' 5"  5\' 5"   BMI 39.61 kg/m2 41.1 kg/m2 41.29 kg/m2

## 2019-04-06 NOTE — Progress Notes (Signed)
[name]     MRN: [id number]      DOB: 1955-12-23   HPI [name] is here for follow up and re-evaluation of chronic medical conditions, medication management and review of any available recent lab and radiology data.  Preventive health is updated, specifically  Cancer screening and Immunization.   Questions or concerns regarding consultations or procedures which the PT has had in the interim are  addressed. The PT denies any adverse reactions to current medications since the last visit.  Had seizure during December despite being on her medication has appt later today with Neurology Daughter in with [name], feels as tho she is still able to live on her own , has 3 hrs supervised help daily and this is good. Concerned as to increased confusion/ expression of fear  esp at night, not sleeping and states she was doing better when she was taking her fluoxetine ROS Denies recent fever or chills. Denies sinus pressure, nasal congestion, ear pain or sore throat. Denies chest congestion, productive cough or wheezing. Denies chest pains, palpitations and leg swelling Denies abdominal pain, nausea, vomiting,diarrhea or constipation.   Denies dysuria, frequency, hesitancy or incontinence. Denies uncontrolled oint pain, swelling and limitation in mobility. Denies headaches, seizures, numbness, or tingling.  Denies skin break down or rash.   PE  BP 138/80   Pulse 88   Temp (!) 97.4 F (36.3 C) (Temporal)   Resp 15   Ht 5\' 5"  (1.651 m)   Wt 238 lb (108 kg)   SpO2 96%   BMI 39.61 kg/m   Patient alert and oriented and in no cardiopulmonary distress.  HEENT: No facial asymmetry, EOMI,     Neck supple .  Chest: Clear to auscultation bilaterally.  CVS: S1, S2 no murmurs, no S3.Regular rate.  ABD: Soft non tender.   Ext: No edema  MS: Adequate though reduced  ROM spine, shoulders, hips and knees.  Skin: Intact, no ulcerations or rash noted.  Psych: Good eye contact, normal  affect. Memory intact not anxious or depressed appearing.  CNS: CN 2-12 intact, power,  normal throughout.no focal deficits noted.   Assessment & Plan  Depression, major, single episode, mild (HCC) Untreated , and symptomatic, needs to resume fluoxetine, phone follow up  Essential hypertension Controlled, no change in medication DASH diet and commitment to daily physical activity for a minimum of 30 minutes discussed and encouraged, as a part of hypertension management. The importance of attaining a healthy weight is also discussed.  BP/Weight 04/04/2019 12/01/2018 08/30/2018 06/28/2018 04/29/2018 04/13/2018 7/71/1657  Systolic BP 903 833 383 90 291 99 916  Diastolic BP 80 82 90 60 90 78 80  Wt. (Lbs) 238 247 248.12 247 247.04 249 256.2  BMI 39.61 41.1 41.29 41.1 41.11 41.44 42.63       Morbid obesity (HCC) Obesity linked with hypertension and dewpression  Patient re-educated about  the importance of commitment to a  minimum of 150 minutes of exercise per week as able.  The importance of healthy food choices with portion control discussed, as well as eating regularly and within a 12 hour window most days. The need to choose "clean , green" food 50 to 75% of the time is discussed, as well as to make water the primary drink and set a goal of 64 ounces water daily.    Weight /BMI 04/04/2019 12/01/2018 08/30/2018  WEIGHT 238 lb 247 lb 248 lb 1.9 oz  HEIGHT 5\' 5"  5\' 5"  5\' 5"   BMI 39.61 kg/m2 41.1 kg/m2 41.29 kg/m2      Seizure disorder ([place]) Reports recent seizure activity despite medication, has neurology apppt today

## 2019-04-25 ENCOUNTER — Ambulatory Visit: Payer: Medicaid Other | Admitting: "Endocrinology

## 2019-05-02 ENCOUNTER — Emergency Department (HOSPITAL_COMMUNITY): Payer: Medicare Other

## 2019-05-02 ENCOUNTER — Encounter (HOSPITAL_COMMUNITY): Payer: Self-pay | Admitting: Emergency Medicine

## 2019-05-02 ENCOUNTER — Other Ambulatory Visit: Payer: Self-pay

## 2019-05-02 ENCOUNTER — Emergency Department (HOSPITAL_COMMUNITY)
Admission: EM | Admit: 2019-05-02 | Discharge: 2019-05-02 | Disposition: A | Discharge status: Home / Self Care | Payer: Medicare Other | Attending: Emergency Medicine | Admitting: Emergency Medicine

## 2019-05-02 DIAGNOSIS — N281 Cyst of kidney, acquired: Secondary | ICD-10-CM | POA: Diagnosis not present

## 2019-05-02 DIAGNOSIS — N390 Urinary tract infection, site not specified: Secondary | ICD-10-CM | POA: Insufficient documentation

## 2019-05-02 DIAGNOSIS — N183 Chronic kidney disease, stage 3 unspecified: Secondary | ICD-10-CM | POA: Insufficient documentation

## 2019-05-02 DIAGNOSIS — R350 Frequency of micturition: Secondary | ICD-10-CM

## 2019-05-02 DIAGNOSIS — E039 Hypothyroidism, unspecified: Secondary | ICD-10-CM | POA: Insufficient documentation

## 2019-05-02 DIAGNOSIS — I129 Hypertensive chronic kidney disease with stage 1 through stage 4 chronic kidney disease, or unspecified chronic kidney disease: Secondary | ICD-10-CM | POA: Diagnosis not present

## 2019-05-02 DIAGNOSIS — R1030 Lower abdominal pain, unspecified: Secondary | ICD-10-CM

## 2019-05-02 DIAGNOSIS — Z79899 Other long term (current) drug therapy: Secondary | ICD-10-CM | POA: Insufficient documentation

## 2019-05-02 LAB — URINALYSIS, ROUTINE W REFLEX MICROSCOPIC
Bacteria, UA: NONE SEEN
Bilirubin Urine: NEGATIVE
Glucose, UA: NEGATIVE mg/dL
Hgb urine dipstick: NEGATIVE
Ketones, ur: NEGATIVE mg/dL
Nitrite: NEGATIVE
Protein, ur: 30 mg/dL — AB
Specific Gravity, Urine: 1.028 (ref 1.005–1.030)
pH: 6 (ref 5.0–8.0)

## 2019-05-02 LAB — COMPREHENSIVE METABOLIC PANEL
ALT: 15 U/L (ref 0–44)
AST: 17 U/L (ref 15–41)
Albumin: 3.8 g/dL (ref 3.5–5.0)
Alkaline Phosphatase: 96 U/L (ref 38–126)
Anion gap: 10 (ref 5–15)
BUN: 27 mg/dL — ABNORMAL HIGH (ref 8–23)
CO2: 23 mmol/L (ref 22–32)
Calcium: 9.5 mg/dL (ref 8.9–10.3)
Chloride: 107 mmol/L (ref 98–111)
Creatinine, Ser: 1.49 mg/dL — ABNORMAL HIGH (ref 0.44–1.00)
GFR calc Af Amer: 42 mL/min — ABNORMAL LOW (ref >60)
GFR calc non Af Amer: 36 mL/min — ABNORMAL LOW (ref >60)
Glucose, Bld: 91 mg/dL (ref 70–99)
Potassium: 3.5 mmol/L (ref 3.5–5.1)
Sodium: 140 mmol/L (ref 135–145)
Total Bilirubin: 0.7 mg/dL (ref 0.3–1.2)
Total Protein: 7.8 g/dL (ref 6.5–8.1)

## 2019-05-02 LAB — CBC WITH DIFFERENTIAL/PLATELET
Abs Immature Granulocytes: 0.01 10*3/uL (ref 0.00–0.07)
Basophils Absolute: 0 10*3/uL (ref 0.0–0.1)
Basophils Relative: 1 %
Eosinophils Absolute: 0.1 10*3/uL (ref 0.0–0.5)
Eosinophils Relative: 2 %
HCT: 40.1 % (ref 36.0–46.0)
Hemoglobin: 12.1 g/dL (ref 12.0–15.0)
Immature Granulocytes: 0 %
Lymphocytes Relative: 26 %
Lymphs Abs: 1 10*3/uL (ref 0.7–4.0)
MCH: 27.9 pg (ref 26.0–34.0)
MCHC: 30.2 g/dL (ref 30.0–36.0)
MCV: 92.4 fL (ref 80.0–100.0)
Monocytes Absolute: 0.4 10*3/uL (ref 0.1–1.0)
Monocytes Relative: 9 %
Neutro Abs: 2.5 10*3/uL (ref 1.7–7.7)
Neutrophils Relative %: 62 %
Platelets: 206 10*3/uL (ref 150–400)
RBC: 4.34 MIL/uL (ref 3.87–5.11)
RDW: 14.4 % (ref 11.5–15.5)
WBC: 4 10*3/uL (ref 4.0–10.5)
nRBC: 0 % (ref 0.0–0.2)

## 2019-05-02 LAB — LIPASE, BLOOD: Lipase: 41 U/L (ref 11–51)

## 2019-05-02 MED ORDER — SODIUM CHLORIDE 0.9 % IV BOLUS
500.0000 mL | Freq: Once | INTRAVENOUS | Status: AC
  Administered 2019-05-02: 10:00:00 500 mL via INTRAVENOUS

## 2019-05-02 MED ORDER — CEPHALEXIN 250 MG PO CAPS: 250.0000 mg | ORAL_CAPSULE | Freq: Two times a day (BID) | ORAL | 0 refills | Status: AC

## 2019-05-02 MED ORDER — IOHEXOL 300 MG/ML  SOLN
75.0000 mL | Freq: Once | INTRAMUSCULAR | Status: AC | PRN
  Administered 2019-05-02: 75 mL via INTRAVENOUS

## 2019-05-02 NOTE — ED Provider Notes (Signed)
[facility] EMERGENCY DEPARTMENT Provider Note   CSN: [id number] Arrival date & time: 05/02/19  2979     History Chief Complaint  Patient presents with  . Abdominal Pain    [name] is a 64 y.o. female.  64 year old female with past medical history of hyperlipidemia, hypertension, seizure disorder, obesity presents with complaint of lower abdominal pain.  Patient states that she has had a little bit of discomfort in her lower abdominal area for an unknown amount of time however woke up at 6 AM today with worsening pain, unable to go back to sleep.  Patient states that she has had a bowel movement within the past week, unable to say when but did not have a bowel movement yesterday which is not normal for her.  She denies dark or bloody stools.  Patient states that she was up frequently throughout the night to go to the bathroom which is not normal for her, denies dysuria or hematuria.  No associated nausea or vomiting.  Pain is located across the lower abdomen described as aching, does not radiate, denies back pain.  No history of similar pain previously.  Patient reports prior abdominal hysterectomy, no other abdominal surgeries.  No other complaints or concerns.        Past Medical History:  Diagnosis Date  . Depression   . Fractures   . Hyperlipidemia   . Hypertension   . Mental retardation, mild (I.Q. 50-70)   . Obesity   . Seizure ([place])    11/17/2012 "still having them; "I believe their from a tumor in my head" ((11/17/2012  . Seizure disorder ([place])   . Thyroid cancer [facility])     Patient Active Problem List   Diagnosis Date Noted  . Tachycardia 07/04/2018  . Acute renal failure superimposed on stage 3 chronic kidney disease ([place])   . Depression, major, single episode, mild ([place]) 03/21/2018  . CKD (chronic kidney disease) stage 3, GFR 30-59 ml/min 04/03/2016  . GERD (gastroesophageal reflux disease) 08/23/2015  . Osteoarthritis of right knee 08/15/2014  .  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 Reduced vision 05/03/2013  . 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 Vitamin D deficiency 08/16/2009  . POLYNEUROPATHY 04/10/2009  . Hyperlipidemia LDL goal <100 07/15/2007  . Morbid obesity ([place]) 07/15/2007  . MENTAL RETARDATION, MILD 07/15/2007  . Essential hypertension 07/15/2007  . Seizure disorder ([place]) 07/15/2007    Past Surgical History:  Procedure Laterality Date  . ABDOMINAL HYSTERECTOMY  2001  . BREAST LUMPECTOMY  1996   right   . COLONOSCOPY N/A 05/16/2016   Procedure: COLONOSCOPY;  Surgeon: Danie Binder, MD;  [facility];  Service: Endoscopy;  Laterality: N/A;  830   . THYROIDECTOMY Bilateral 11/17/2012   Procedure: TOTAL THYROIDECTOMY;  Surgeon: Ascencion Dike, MD;  Location: [place];  Service: ENT;  Laterality: Bilateral;  . TOTAL THYROIDECTOMY Bilateral 11/17/2012  . TUBAL LIGATION       OB History   No obstetric history on file.     Family History  Problem Relation Age of Onset  . Diabetes Mother     Social History   Tobacco Use  . Smoking status: Never Smoker  . Smokeless tobacco: Never Used  Substance Use Topics  . Alcohol use: No  . Drug use: No    Home Medications Prior to Admission medications   Medication Sig Start Date End Date Taking? Authorizing Provider  acetaminophen (TYLENOL) 500 MG tablet Take 1 tablet (500 mg total)  by mouth 2 (two) times daily. Patient taking differently: Take 500 mg by mouth 2 (two) times daily as needed for mild pain or headache.  08/15/14  Yes [name], MD  amLODipine (NORVASC) 5 MG tablet Take 1 tablet (5 mg total) by mouth daily. 08/30/18  Yes [name], MD  benazepril (LOTENSIN) 20 MG tablet Take 1 tablet (20 mg total) by mouth daily. 06/28/18  Yes [name], MD  calcitRIOL (ROCALTROL) 0.25 MCG capsule Take 0.25 mcg by mouth daily. \   Yes [provider]  ezetimibe (ZETIA) 10 MG tablet Take 1 tablet (10 mg total) by  mouth daily. 04/04/19  Yes [name], MD  FLUoxetine (PROZAC) 10 MG capsule Take 1 capsule (10 mg total) by mouth daily. 04/04/19  Yes [name], MD  LAMICTAL 100 MG tablet Take 100 mg by mouth 2 (two) times daily. 12/15/18  Yes [provider]  levETIRAcetam (KEPPRA) 750 MG tablet Two tablets twice daily Patient taking differently: Take 1,500 mg by mouth 2 (two) times daily.  04/28/17  Yes [name], MD  levothyroxine (SYNTHROID) 137 MCG tablet TAKE 1 TABLET BY MOUTH BEFORE BREAKFAST DAILY. Patient taking differently: Take 137 mcg by mouth daily before breakfast.  01/27/19  Yes Nida, Marella Chimes, MD  loratadine (CLARITIN) 10 MG tablet Take 1 tablet (10 mg total) by mouth daily. 06/28/18  Yes [name], MD  omeprazole (PRILOSEC) 20 MG capsule Take 1 capsule (20 mg total) by mouth daily. 08/30/18  Yes [name], MD  rosuvastatin (CRESTOR) 40 MG tablet TAKE 1 TABLET BY MOUTH ONCE A DAY. Patient taking differently: Take 40 mg by mouth daily.  03/16/19  Yes [name], MD  Vitamin D, Ergocalciferol, (DRISDOL) 1.25 MG (50000 UNIT) CAPS capsule Take 50,000 Units by mouth once a week. 04/23/19  Yes [provider]  cephALEXin (KEFLEX) 250 MG capsule Take 1 capsule (250 mg total) by mouth 2 (two) times daily for 5 days. 05/02/19 05/07/19  Tacy Learn, PA-C    Allergies    Simvastatin  Review of Systems   Review of Systems  Constitutional: Negative for chills and fever.  Respiratory: Negative for shortness of breath.   Cardiovascular: Negative for chest pain.  Gastrointestinal: Positive for abdominal pain and constipation. Negative for blood in stool, diarrhea, nausea and vomiting.  Genitourinary: Positive for frequency and urgency. Negative for dysuria and flank pain.  Musculoskeletal: Negative for back pain.  Skin: Negative for wound.  Allergic/Immunologic: Negative for immunocompromised state.  Neurological: Negative for  weakness.  Hematological: Negative for adenopathy.  Psychiatric/Behavioral: Negative for confusion.  All other systems reviewed and are negative.   Physical Exam Updated Vital Signs BP (!) 148/78 (BP Location: Right Arm)   Pulse 66   Temp 98.3 F (36.8 C) (Oral)   Resp 16   Wt 113.4 kg   SpO2 99%   BMI 41.60 kg/m   Physical Exam Vitals and nursing note reviewed.  Constitutional:      General: She is not in acute distress.    Appearance: She is well-developed. She is not diaphoretic.  HENT:     Head: Normocephalic and atraumatic.  Cardiovascular:     Rate and Rhythm: Normal rate and regular rhythm.     Heart sounds: Normal heart sounds.  Pulmonary:     Effort: Pulmonary effort is normal.     Breath sounds: Normal breath sounds.  Abdominal:     General: A  surgical scar is present.     Palpations: Abdomen is soft.     Tenderness: There is abdominal tenderness in the right lower quadrant, suprapubic area and left lower quadrant. There is no right CVA tenderness or left CVA tenderness.     Comments: Very mild tenderness noted across lower abdominal area.  Skin:    General: Skin is warm and dry.     Findings: No erythema or rash.  Neurological:     Mental Status: She is alert and oriented to person, place, and time.  Psychiatric:        Behavior: Behavior normal.     ED Results / Procedures / Treatments   Labs (all labs ordered are listed, but only abnormal results are displayed) Labs Reviewed  COMPREHENSIVE METABOLIC PANEL - Abnormal; Notable for the following components:      Result Value   BUN 27 (*)    Creatinine, Ser 1.49 (*)    GFR calc non Af Amer 36 (*)    GFR calc Af Amer 42 (*)    All other components within normal limits  URINALYSIS, ROUTINE W REFLEX MICROSCOPIC - Abnormal; Notable for the following components:   Protein, ur 30 (*)    Leukocytes,Ua SMALL (*)    All other components within normal limits  CBC WITH DIFFERENTIAL/PLATELET  LIPASE, BLOOD      EKG None  Radiology CT Abdomen Pelvis W Contrast  Result Date: 05/02/2019 CLINICAL DATA:  Left lower quadrant pain, groin pain EXAM: CT ABDOMEN AND PELVIS WITH CONTRAST TECHNIQUE: Multidetector CT imaging of the abdomen and pelvis was performed using the standard protocol following bolus administration of intravenous contrast. CONTRAST:  37mL OMNIPAQUE IOHEXOL 300 MG/ML  SOLN COMPARISON:  04/06/2016 FINDINGS: Lower chest: No acute abnormality Hepatobiliary: Diffuse low-density throughout the liver, likely fatty infiltration. No focal abnormality. Gallbladder unremarkable. Pancreas: No focal abnormality or ductal dilatation. Spleen: No focal abnormality.  Normal size. Adrenals/Urinary Tract: Right renal cysts, the largest in the upper pole measuring 5 cm, enlarged since prior study when this measured 3.6 cm. Small lower pole cysts bilaterally have enlarged since prior study. There appears to be an enhancing mass in the midpole of the right kidney measuring 3.7 cm. This area was difficult to visualize previously, measuring 2 cm previously. No hydronephrosis bilaterally. Adrenal glands and urinary bladder unremarkable. Stomach/Bowel: Normal appendix. Stomach, large and small bowel grossly unremarkable. Vascular/Lymphatic: Aortic atherosclerosis. No enlarged abdominal or pelvic lymph nodes. Reproductive: Prior hysterectomy.  No adnexal masses. Other: No free fluid or free air. Musculoskeletal: No acute bony abnormality. IMPRESSION: Several bilateral enlarging renal cystic lesions. These appear to represent benign/simple cyst. However, there is an area concerning for enhancing solid mass in the midpole of the right kidney, enlarging since prior study concerning for renal malignancy. Recommend further evaluation with MRI and urologic consultation. Aortic atherosclerosis. No explanation for the patient's left lower quadrant/groin pain. Suspect early/mild diffuse fatty infiltration of the liver. Electronically  Signed   By: Rolm Baptise M.D.   On: 05/02/2019 11:11    Procedures Procedures (including critical care time)  Medications Ordered in ED Medications  sodium chloride 0.9 % bolus 500 mL (0 mLs Intravenous Stopped 05/02/19 1426)  iohexol (OMNIPAQUE) 300 MG/ML solution 75 mL (75 mLs Intravenous Contrast Given 05/02/19 1045)    ED Course  I have reviewed the triage vital signs and the nursing notes.  Pertinent labs & imaging results that were available during my care of the patient were reviewed by me  and considered in my medical decision making (see chart for details).  Clinical Course as of May 02 1518  Mon May 01, 2465  7375 64 year old female presents with complaint of lower abdominal pain for the past few days, woke her from her sleep last night and has been worse since that time.  Associated with urinary frequency without dysuria.  On exam patient has mild tenderness across the diffuse lower abdominal area.  CBC unremarkable, CMP with improvement in patient's renal function, creatinine currently 1.49.  Urinalysis with protein and leukocytes with 6-10 white cells, no bacteria seen.  CT shows multiple enlarging cystic lesions within the kidneys, concern for enhancing solid mass in the midpole of the right kidney enlarging since prior study concerning for renal malignancy.  Recommends MRI and urologic consultation.  Discussed findings with patient, given copies of results to take to her primary care follow-up for further evaluation.  Patient is lower abdominal pain and urinary frequency possibly related to urinary tract infection, will treat with Keflex.  Patient verbalizes understanding of discharge instructions and plan.   [LM]    Clinical Course User Index [LM] Tacy Learn, PA-C   MDM Rules/Calculators/A&P                      Final Clinical Impression(s) / ED Diagnoses Final diagnoses:  Lower abdominal pain  Urinary frequency  Lower urinary tract infectious disease  Renal cyst     Rx / DC Orders ED Discharge Orders         Ordered    cephALEXin (KEFLEX) 250 MG capsule  2 times daily     05/02/19 1426           Roque Lias 05/02/19 1520    Dorie Rank, MD 05/02/19 1538

## 2019-05-02 NOTE — Discharge Instructions (Signed)
Follow up with your doctor for further testing recommended based on your CT today. Please show this paperwork to your doctor.  Several bilateral enlarging renal cystic lesions. These appear to  represent benign/simple cyst. However, there is an area concerning  for enhancing solid mass in the midpole of the right kidney,  enlarging since prior study concerning for renal malignancy.  Recommend further evaluation with MRI and urologic consultation.

## 2019-05-02 NOTE — ED Notes (Addendum)
Attempted to call pt contact for transportation. No response at this time.

## 2019-05-02 NOTE — ED Triage Notes (Signed)
Pt c/o of lower abdominal pain since 6 am. Pt also states " my kidney doctor said my kidneys dont function right but I havent seen her in a few months"

## 2019-05-03 LAB — COMPLETE METABOLIC PANEL WITH GFR
AG Ratio: 1.2 (calc) (ref 1.0–2.5)
ALT: 11 U/L (ref 6–29)
AST: 15 U/L (ref 10–35)
Albumin: 4.1 g/dL (ref 3.6–5.1)
Alkaline phosphatase (APISO): 105 U/L (ref 37–153)
BUN/Creatinine Ratio: 14 (calc) (ref 6–22)
BUN: 21 mg/dL (ref 7–25)
CO2: 24 mmol/L (ref 20–32)
Calcium: 9.9 mg/dL (ref 8.6–10.4)
Chloride: 105 mmol/L (ref 98–110)
Creat: 1.48 mg/dL — ABNORMAL HIGH (ref 0.50–0.99)
GFR, Est African American: 43 mL/min/{1.73_m2} — ABNORMAL LOW (ref >=60)
GFR, Est Non African American: 37 mL/min/{1.73_m2} — ABNORMAL LOW (ref >=60)
Globulin: 3.5 g/dL (calc) (ref 1.9–3.7)
Glucose, Bld: 97 mg/dL (ref 65–99)
Potassium: 3.8 mmol/L (ref 3.5–5.3)
Sodium: 139 mmol/L (ref 135–146)
Total Bilirubin: 0.5 mg/dL (ref 0.2–1.2)
Total Protein: 7.6 g/dL (ref 6.1–8.1)

## 2019-05-03 LAB — LIPID PANEL
Cholesterol: 140 mg/dL (ref <200)
HDL: 51 mg/dL (ref >=50)
LDL Cholesterol (Calc): 76 mg/dL (calc)
Non-HDL Cholesterol (Calc): 89 mg/dL (calc) (ref <130)
Total CHOL/HDL Ratio: 2.7 (calc) (ref <5.0)
Triglycerides: 58 mg/dL (ref <150)

## 2019-05-04 ENCOUNTER — Other Ambulatory Visit: Payer: Self-pay | Admitting: "Endocrinology

## 2019-05-04 ENCOUNTER — Telehealth: Payer: Self-pay | Admitting: "Endocrinology

## 2019-05-04 DIAGNOSIS — E890 Postprocedural hypothyroidism: Secondary | ICD-10-CM

## 2019-05-04 NOTE — Telephone Encounter (Signed)
Order updated

## 2019-05-04 NOTE — Telephone Encounter (Signed)
Can you update lab order °

## 2019-05-06 LAB — THYROGLOBULIN ANTIBODY: Thyroglobulin Ab: <1 IU/mL (ref <=1)

## 2019-05-06 LAB — T4, FREE: Free T4: 1.7 ng/dL (ref 0.8–1.8)

## 2019-05-06 LAB — THYROGLOBULIN LEVEL: Thyroglobulin: 0.2 ng/mL — ABNORMAL LOW

## 2019-05-06 LAB — TSH: TSH: 0.16 mIU/L — ABNORMAL LOW (ref 0.40–4.50)

## 2019-05-09 ENCOUNTER — Encounter: Payer: Self-pay | Admitting: "Endocrinology

## 2019-05-09 ENCOUNTER — Ambulatory Visit (INDEPENDENT_AMBULATORY_CARE_PROVIDER_SITE_OTHER): Payer: Medicare Other | Admitting: "Endocrinology

## 2019-05-09 VITALS — BP 135/79 | HR 90 | Ht 65.0 in | Wt 237.8 lb

## 2019-05-09 DIAGNOSIS — E890 Postprocedural hypothyroidism: Secondary | ICD-10-CM

## 2019-05-09 DIAGNOSIS — C73 Malignant neoplasm of thyroid gland: Secondary | ICD-10-CM | POA: Diagnosis not present

## 2019-05-09 MED ORDER — LEVOTHYROXINE SODIUM 137 MCG PO TABS: 137.0000 ug | ORAL_TABLET | Freq: Every day | ORAL | 1 refills | Status: DC

## 2019-05-09 NOTE — Progress Notes (Signed)
05/09/2019                                Endocrinology Telehealth Visit Follow up Note -During COVID -19 Pandemic  I connected with [name] on 05/09/2019   by telephone and verified that I am speaking with the correct person using two identifiers. [name], 10/13/1955. she has verbally consented to this visit. All issues noted in this document were discussed and addressed. The format was not optimal for physical exam.    Subjective:    Patient ID: [name], female    DOB: 10-16-1955,    Past Medical History:  Diagnosis Date  . Depression   . Fractures   . Hyperlipidemia   . Hypertension   . Mental retardation, mild (I.Q. 50-70)   . Obesity   . Seizure ([place])    11/17/2012 "still having them; "I believe their from a tumor in my head" ((11/17/2012  . Seizure disorder ([place])   . Thyroid cancer [facility])    Past Surgical History:  Procedure Laterality Date  . ABDOMINAL HYSTERECTOMY  2001  . BREAST LUMPECTOMY  1996   right   . COLONOSCOPY N/A 05/16/2016   Procedure: COLONOSCOPY;  Surgeon: Danie Binder, MD;  Location: [facility];  Service: Endoscopy;  Laterality: N/A;  830   . THYROIDECTOMY Bilateral 11/17/2012   Procedure: TOTAL THYROIDECTOMY;  Surgeon: Ascencion Dike, MD;  Location: [facility];  Service: ENT;  Laterality: Bilateral;  . TOTAL THYROIDECTOMY Bilateral 11/17/2012  . TUBAL LIGATION     Social History   Socioeconomic History  . Marital status: Single    Spouse name: Not on file  . Number of children: 2  . Years of education: Not on file  . Highest education level: Not on file  Occupational History  . Occupation: disabled   Tobacco Use  . Smoking status: Never Smoker  . Smokeless tobacco: Never Used  Substance and Sexual Activity  . Alcohol use: No  . Drug use: No  . Sexual activity: Never  Other Topics Concern  . Not on file  Social History Narrative  . Not on file   Social Determinants of Health   Financial Resource Strain:   .  Difficulty of Paying Living Expenses:   Food Insecurity:   . Worried About Charity fundraiser in the Last Year:   . Arboriculturist in the Last Year:   Transportation Needs:   . Film/video editor (Medical):   Marland Kitchen Lack of Transportation (Non-Medical):   Physical Activity:   . Days of Exercise per Week:   . Minutes of Exercise per Session:   Stress:   . Feeling of Stress :   Social Connections:   . Frequency of Communication with Friends and Family:   . Frequency of Social Gatherings with Friends and Family:   . Attends Religious Services:   . Active Member of Clubs or Organizations:   . Attends Archivist Meetings:   Marland Kitchen Marital Status:    Outpatient Encounter Medications as of 05/09/2019  Medication Sig  . acetaminophen (TYLENOL) 500 MG tablet Take 1 tablet (500 mg total) by mouth 2 (two) times daily. (Patient taking differently: Take 500 mg by mouth 2 (two) times daily as needed for mild pain or headache. )  . amLODipine (NORVASC) 5 MG tablet Take 1 tablet (5 mg total) by mouth daily.  Marland Kitchen  benazepril (LOTENSIN) 20 MG tablet Take 1 tablet (20 mg total) by mouth daily.  . calcitRIOL (ROCALTROL) 0.25 MCG capsule Take 0.25 mcg by mouth daily. \  . ezetimibe (ZETIA) 10 MG tablet Take 1 tablet (10 mg total) by mouth daily.  Marland Kitchen FLUoxetine (PROZAC) 10 MG capsule Take 1 capsule (10 mg total) by mouth daily.  Marland Kitchen LAMICTAL 100 MG tablet Take 100 mg by mouth 2 (two) times daily.  Marland Kitchen levETIRAcetam (KEPPRA) 750 MG tablet Two tablets twice daily (Patient taking differently: Take 1,500 mg by mouth 2 (two) times daily. )  . levothyroxine (SYNTHROID) 137 MCG tablet Take 1 tablet (137 mcg total) by mouth daily before breakfast.  . loratadine (CLARITIN) 10 MG tablet Take 1 tablet (10 mg total) by mouth daily.  Marland Kitchen omeprazole (PRILOSEC) 20 MG capsule Take 1 capsule (20 mg total) by mouth daily.  . rosuvastatin (CRESTOR) 40 MG tablet TAKE 1 TABLET BY MOUTH ONCE A DAY. (Patient taking differently: Take  40 mg by mouth daily. )  . Vitamin D, Ergocalciferol, (DRISDOL) 1.25 MG (50000 UNIT) CAPS capsule Take 50,000 Units by mouth once a week.  . [DISCONTINUED] levothyroxine (SYNTHROID) 137 MCG tablet TAKE 1 TABLET BY MOUTH BEFORE BREAKFAST DAILY. (Patient taking differently: Take 137 mcg by mouth daily before breakfast. )   No facility-administered encounter medications on file as of 05/09/2019.   ALLERGIES: Allergies  Allergen Reactions  . Simvastatin Other (See Comments)    Muscle aches   VACCINATION STATUS: Immunization History  Administered Date(s) Administered  . H1N1 12/16/2007  . Influenza Split 11/24/2011  . Influenza Whole 11/30/2006, 12/07/2008, 12/26/2009  . Influenza,inj,Quad PF,6+ Mos 11/18/2012, 12/06/2013, 05/23/2015, 01/29/2016, 12/01/2016, 01/18/2018, 12/01/2018  . Pneumococcal Conjugate-13 04/03/2014  . Td 08/22/2003  . Tdap 04/02/2016    HPI  64 yr old female with medical hx as above . She is here to follow-up for post surgical hypothyroidism and for follow-up of Papillary  thyroid cancer. 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 She has had  Thyrogen stimulated remnant ablation with negative post therapy WBS in 2014.  - Second Thyrogen Stimulated whole body scan for surveillance shows no scintigraphic evidence of iodine avid metastasis papillary thyroid cancer on 04/18/2016.   -Most recent surveillance imaging with Thyrogen stimulated whole-body scan on September 27, 2018 showed no evidence of tumor recurrence.  She is now on Synthroid 137 g by mouth every morning.   -She is compliant  to her thyroid hormone.  She has no new complaints today.   she denies palpitations. she denies dysphagia, SOB, nor voice change. she denies family history of thyroid cancer.  Review of systems: Limited as above.  Objective:    BP 135/79   Pulse 90    Ht 5\' 5"  (1.651 m)   Wt 237 lb 12.8 oz (107.9 kg)   BMI 39.57 kg/m   Wt Readings from Last 3 Encounters:  05/09/19 237 lb 12.8 oz (107.9 kg)  05/02/19 250 lb (113.4 kg)  04/04/19 238 lb (108 kg)     Physical Exam- Limited  Constitutional:  Body mass index is 39.57 kg/m. , not in acute distress, normal state of mind Eyes:  EOMI, no exophthalmos Neck: supple Thyroid: + Post Thyroidectomy scar.   Respiratory: Adequate breathing efforts Musculoskeletal: no gross deformities, strength intact in all four extremities, no gross restriction of joint movements Skin:  no rashes, no hyperemia Neurological:  no tremor with outstretched hands,    Results for orders placed or performed in visit on 05/04/19  Thyroglobulin Level  Result Value Ref Range   Thyroglobulin 0.2 (L) ng/mL   Comment    Thyroglobulin antibody  Result Value Ref Range   Thyroglobulin Ab <1 < or = 1 IU/mL  T4, Free  Result Value Ref Range   Free T4 1.7 0.8 - 1.8 ng/dL  TSH  Result Value Ref Range   TSH 0.16 (L) 0.40 - 4.50 mIU/L   Complete Blood Count (Most recent): Lab Results  Component Value Date   WBC 4.0 05/02/2019   HGB 12.1 05/02/2019   HCT 40.1 05/02/2019   MCV 92.4 05/02/2019   PLT 206 05/02/2019   Diabetic Labs (most recent): Lab Results  Component Value Date   HGBA1C 5.2 05/26/2017   HGBA1C 5.3 08/10/2012   Lipid Panel     Component Value Date/Time   CHOL 140 05/03/2019 0832   CHOL 231 (H) 06/18/2016 0954   TRIG 58 05/03/2019 0832   HDL 51 05/03/2019 0832   HDL 46 06/18/2016 0954   CHOLHDL 2.7 05/03/2019 0832   VLDL 14 01/29/2016 1109   LDLCALC 76 05/03/2019 0832    June 01, 2017  IMPRESSION:  Questioned approximately 2.2 cm soft tissue within the right lobectomy resection bed appears similar to the 07/2015 examination.  Note, while nonspecific, this tissue was apparently present at the time of the Thyrogen stimulated I 131 nuclear medicine whole body scan performed 03/2016  which was negative for the presence of iodine avid metastatic thyroid cancer or residual disease, and thus favored to be benign etiology. Clinical correlation is advised.  Electronically Signed   By: Sandi Mariscal M.D.   On: 06/01/2017 11:38   Thyrogen stimulated whole-body scan September 27, 2018 fINDINGS:  Excreted tracer within colon and urinary bladder. Small amount of retained gastric activity. Pattern is similar to that seen on the previous exam.  No sites of iodine-avid metastatic thyroid cancer identified. No residual tracer accumulation in the neck.  IMPRESSION: No scintigraphic evidence of iodine-avid metastatic thyroid cancer.   Assessment & Plan:   1. Hypothyroidism, postsurgical -Her repeat thyroid function tests are consistent with appropriate replacement.  She is advised to continue levothyroxine 137 mcg p.o. daily before breakfast.    - We discussed about the correct intake of her thyroid hormone, on empty stomach at fasting, with water, separated by at least 30 minutes from breakfast and other medications,  and separated by more than 4 hours from calcium, iron, multivitamins, acid reflux medications (PPIs). -Patient is made aware of the fact that thyroid hormone replacement is needed for life, dose to be adjusted by periodic monitoring of thyroid function tests.   - Target TSH for her would be between 0.1-0.5.  2. Papillary thyroid carcinoma ([place]) Her  surveillance neck /thyroid u/s from 11/17/2013 was c/w surgically absent thyroid, however there is a 0.9 cm left cervical lymph node. See below.  Pt is s/p thyrogen stimulated thyroid remnant ablation with WBS on 01/14/2013 with no evidence of iodine avid metastasis. She has had pT2,Nx,Mx FVPTC, s/p near total thyroidectomy on September 24,2014. She is counseled to maintain high degree of compliance with synthroid therapy with aim of keeping TSH low normal ( 0.1-0.5).  -she is advised on the necessity of follow-ups  and imaging studies at least one annually for the next 5 years. - She was sent for core biopsy of 0.9 cm left cervical lymph node  last visit. However, this procedure was abandoned due to " no evidence of adenopathy".  The previously noticed  left cervical lymph node was sent to decrease in size to 0.5 cm. Medicaid did not cover surveillance ultrasound. - Second Thyrogen estimated or body scan for surveillance shows no scintigraphic evidence of iodine avid metastasis papillary thyroid cancer on 04/18/2016. -June 01, 2017 surveillance thyroid/neck ultrasound : Unremarkable, see above.   -She underwent  Thyrogen stimulated whole body scan which is negative for tumor recurrence.   She will be considered for surveillance thyroid/neck ultrasound before her next visit in 6 months.     She is advised to keep close follow-up with Dr. Tula Nakayama for primary care needs.     - Time spent on this patient care encounter:  25 minutes of which 50% was spent in  counseling and the rest reviewing  her current and  previous labs / studies and medications  doses and developing a plan for long term care. [name]  participated in the discussions, expressed understanding, and voiced agreement with the above plans.  All questions were answered to her satisfaction. she is encouraged to contact clinic should she have any questions or concerns prior to her return visit.   Follow up plan: Return in about 6 months (around 11/09/2019) for Follow up with Pre-visit Labs, Thyroid / Neck Ultrasound.  Glade Lloyd, MD Phone: [phone]  Fax: [phone]  -  This note was partially dictated with voice recognition software. Similar sounding words can be transcribed inadequately or may not  be corrected upon review.   05/09/2019, 12:54 PM

## 2019-05-10 ENCOUNTER — Ambulatory Visit: Payer: Medicare Other | Admitting: Family Medicine

## 2019-05-10 ENCOUNTER — Ambulatory Visit (INDEPENDENT_AMBULATORY_CARE_PROVIDER_SITE_OTHER): Payer: Medicare Other | Admitting: Family Medicine

## 2019-05-10 ENCOUNTER — Other Ambulatory Visit: Payer: Self-pay

## 2019-05-10 ENCOUNTER — Encounter: Payer: Self-pay | Admitting: Family Medicine

## 2019-05-10 VITALS — BP 140/82 | HR 90 | Temp 97.2°F | Resp 16 | Ht 65.0 in | Wt 236.0 lb

## 2019-05-10 DIAGNOSIS — F70 Mild intellectual disabilities: Secondary | ICD-10-CM

## 2019-05-10 DIAGNOSIS — N2889 Other specified disorders of kidney and ureter: Secondary | ICD-10-CM

## 2019-05-10 DIAGNOSIS — Z23 Encounter for immunization: Secondary | ICD-10-CM | POA: Diagnosis not present

## 2019-05-10 DIAGNOSIS — R300 Dysuria: Secondary | ICD-10-CM | POA: Diagnosis not present

## 2019-05-10 DIAGNOSIS — Z09 Encounter for follow-up examination after completed treatment for conditions other than malignant neoplasm: Secondary | ICD-10-CM

## 2019-05-10 LAB — POCT URINALYSIS DIP (CLINITEK)
Bilirubin, UA: NEGATIVE
Glucose, UA: NEGATIVE mg/dL
Ketones, POC UA: NEGATIVE mg/dL
Leukocytes, UA: NEGATIVE
Nitrite, UA: NEGATIVE
POC PROTEIN,UA: 100 — AB
Spec Grav, UA: 1.025 (ref 1.010–1.025)
Urobilinogen, UA: 0.2 E.U./dL
pH, UA: 5.5 (ref 5.0–8.0)

## 2019-05-10 NOTE — Progress Notes (Signed)
Subjective:  Patient ID: [name], female    DOB: 1955/03/26  Age: 64 y.o. MRN: [id number]  CC:  Chief Complaint  Patient presents with  . ER follow up      HPI  HPI  [name] is a 64 year old female with past medical history of hyperlipidemia, hypertension, seizure disorder, obesity presented to ED on 05/02/2019 with complaint of lower abdominal pain. ED workup included, finding UTI which she was treated, reports she finished meds. They also performed a CT scan and noted cysts on her kidneys, one on the Right have enlarged from previous study, which warned attention for concern of malignancy.  Today she reports still aching pain in abdomen area, pain with urination. She says eating makes the pain worse. Worse when she lays on either side, so she has to lay on back. Tender when pressed. She denies dark or bloody stools.  Patient states that she was up frequently throughout the night to go to the bathroom which is not normal for her, denies hematuria.  No associated nausea or vomiting.  Pain is located across the lower abdomen described as aching, does not radiate, denies back pain.  No history of similar pain previously.  Patient reports prior abdominal hysterectomy, no other abdominal surgeries.  No other complaints or concerns.  Sister is present and wants to discuss group home and or daycare options.  Today patient denies signs and symptoms of COVID 19 infection including fever, chills, cough, shortness of breath, and headache. Past Medical, Surgical, Social History, Allergies, and Medications have been Reviewed.   Past Medical History:  Diagnosis Date  . Depression   . Fractures   . Hyperlipidemia   . Hypertension   . Mental retardation, mild (I.Q. 50-70)   . Obesity   . Seizure ([place])    11/17/2012 "still having them; "I believe their from a tumor in my head" ((11/17/2012  . Seizure disorder ([place])   . Thyroid cancer ([place])     Current Meds  Medication Sig  .  acetaminophen (TYLENOL) 500 MG tablet Take 1 tablet (500 mg total) by mouth 2 (two) times daily. (Patient taking differently: Take 500 mg by mouth 2 (two) times daily as needed for mild pain or headache. )  . amLODipine (NORVASC) 5 MG tablet Take 1 tablet (5 mg total) by mouth daily.  . benazepril (LOTENSIN) 20 MG tablet Take 1 tablet (20 mg total) by mouth daily.  . calcitRIOL (ROCALTROL) 0.25 MCG capsule Take 0.25 mcg by mouth daily. \  . ezetimibe (ZETIA) 10 MG tablet Take 1 tablet (10 mg total) by mouth daily.  Marland Kitchen FLUoxetine (PROZAC) 10 MG capsule Take 1 capsule (10 mg total) by mouth daily.  Marland Kitchen LAMICTAL 100 MG tablet Take 100 mg by mouth 2 (two) times daily.  Marland Kitchen levETIRAcetam (KEPPRA) 750 MG tablet Two tablets twice daily (Patient taking differently: Take 1,500 mg by mouth 2 (two) times daily. )  . levothyroxine (SYNTHROID) 137 MCG tablet Take 1 tablet (137 mcg total) by mouth daily before breakfast.  . loratadine (CLARITIN) 10 MG tablet Take 1 tablet (10 mg total) by mouth daily.  Marland Kitchen omeprazole (PRILOSEC) 20 MG capsule Take 1 capsule (20 mg total) by mouth daily.  . rosuvastatin (CRESTOR) 40 MG tablet TAKE 1 TABLET BY MOUTH ONCE A DAY. (Patient taking differently: Take 40 mg by mouth daily. )  . Vitamin D, Ergocalciferol, (DRISDOL) 1.25 MG (50000 UNIT) CAPS capsule Take 50,000 Units by mouth once a week.  ROS:  Review of Systems  Constitutional: Negative.   HENT: Negative.   Eyes: Negative.   Respiratory: Negative.   Cardiovascular: Negative.   Gastrointestinal: Negative.   Genitourinary:       See HPI   Musculoskeletal: Negative.   Skin: Negative.   Neurological: Negative.   Endo/Heme/Allergies: Negative.   Psychiatric/Behavioral: Negative.   All other systems reviewed and are negative.    Objective:   Today's Vitals: BP 140/82   Pulse 90   Temp (!) 97.2 F (36.2 C) (Temporal)   Resp 16   Ht 5\' 5"  (1.651 m)   Wt 236 lb (107 kg)   SpO2 99%   BMI 39.27 kg/m  Vitals  with BMI 05/10/2019 05/09/2019 05/02/2019  Height 5\' 5"  5\' 5"  -  Weight 236 lbs 237 lbs 13 oz -  BMI 62.95 28.41 -  Systolic 324 401 027  Diastolic 82 79 78  Pulse 90 90 66     Physical Exam Vitals and nursing note reviewed.  Constitutional:      Appearance: Normal appearance. She is well-developed and well-groomed. She is obese.  HENT:     Head: Normocephalic and atraumatic.     Right Ear: External ear normal.     Left Ear: External ear normal.     Mouth/Throat:     Comments: Mask in place   Eyes:     General:        Right eye: No discharge.        Left eye: No discharge.     Conjunctiva/sclera: Conjunctivae normal.  Cardiovascular:     Rate and Rhythm: Normal rate and regular rhythm.     Pulses: Normal pulses.     Heart sounds: Normal heart sounds.  Pulmonary:     Effort: Pulmonary effort is normal.     Breath sounds: Normal breath sounds.  Abdominal:     Tenderness: There is abdominal tenderness in the right lower quadrant and left lower quadrant. There is no right CVA tenderness or left CVA tenderness.  Musculoskeletal:        General: Normal range of motion.     Cervical back: Normal range of motion and neck supple.  Skin:    General: Skin is warm.  Neurological:     General: No focal deficit present.     Mental Status: She is alert and oriented to person, place, and time.  Psychiatric:        Attention and Perception: Attention normal.        Mood and Affect: Mood normal. Affect is flat.        Speech: Speech normal.        Behavior: Behavior is slowed. Behavior is cooperative.        Thought Content: Thought content normal.        Cognition and Memory: Cognition is impaired.    Assessment   1. Renal mass, right   2. Other specified disorders of kidney and ureter   3. Dysuria   4. Encounter for examination following treatment at hospital   5. Mild intellectual disabilities   6. Encounter for vaccination     Tests ordered Orders Placed This Encounter    Procedures  . MR Abdomen W Wo Contrast  . Ambulatory referral to Urology     Plan: Please see assessment and plan per problem list above.   No orders of the defined types were placed in this encounter.   Patient to follow-up in 2 months  Jarrett Soho  Trude Mcburney, NP

## 2019-05-10 NOTE — Assessment & Plan Note (Signed)
Family request help with her care. Group home or Daycare for Adults. Provided with information today.

## 2019-05-10 NOTE — Assessment & Plan Note (Signed)
Due for PNA 23 Provided today.  Patient was educated on the recommendation for PNA 23 vaccine. After obtaining informed consent, the vaccine was administered no adverse effects noted at time of administration. Patient provided with education on arm soreness and use of tylenol or ibuprofen (if safe) for this. Encourage to use the arm vaccine was given in to help reduce the soreness. Patient educated on the signs of a reaction to the vaccine and advised to contact the office should these occur.

## 2019-05-10 NOTE — Assessment & Plan Note (Signed)
Urine check to see if UTI is cleared

## 2019-05-10 NOTE — Assessment & Plan Note (Signed)
CT scan finding from ER visit. Ordered follow up MRI and urology referral.

## 2019-05-10 NOTE — Assessment & Plan Note (Signed)
Please see note. I have personally reviewed labs, notes, and findings from the visit. Order for MRI and referral to urology was placed.

## 2019-05-10 NOTE — Patient Instructions (Addendum)
I appreciate the opportunity to provide you with care for your health and wellness. Today we discussed: recent ED visit   Follow up: Please move out her appt next week for 2 months   Referral to Urology  MRI for kidney check   Urine check today-send out for treatment needs  Please see list of available areas that can help with care and interaction.  Please continue to practice social distancing to keep you, your family, and our community safe.  If you must go out, please wear a mask and practice good handwashing.  It was a pleasure to see you and I look forward to continuing to work together on your health and well-being. Please do not hesitate to call the office if you need care or have questions about your care.  Have a wonderful day and week. With Gratitude, Cherly Beach, DNP, AGNP-BC

## 2019-05-11 ENCOUNTER — Other Ambulatory Visit (HOSPITAL_COMMUNITY)
Admission: RE | Admit: 2019-05-11 | Discharge: 2019-05-11 | Disposition: A | Discharge status: Home / Self Care | Payer: Medicare Other | Source: Other Acute Inpatient Hospital | Attending: Family Medicine | Admitting: Family Medicine

## 2019-05-11 DIAGNOSIS — R300 Dysuria: Secondary | ICD-10-CM | POA: Diagnosis present

## 2019-05-13 ENCOUNTER — Other Ambulatory Visit: Payer: Self-pay | Admitting: Family Medicine

## 2019-05-13 DIAGNOSIS — E785 Hyperlipidemia, unspecified: Secondary | ICD-10-CM

## 2019-05-13 LAB — URINE CULTURE

## 2019-05-17 ENCOUNTER — Ambulatory Visit: Payer: Medicare Other | Admitting: Family Medicine

## 2019-06-08 ENCOUNTER — Ambulatory Visit (HOSPITAL_COMMUNITY): Admission: RE | Admit: 2019-06-08 | Payer: Medicare Other | Source: Ambulatory Visit

## 2019-06-08 ENCOUNTER — Ambulatory Visit: Payer: Medicare Other | Admitting: Urology

## 2019-06-16 ENCOUNTER — Other Ambulatory Visit: Payer: Self-pay | Admitting: Family Medicine

## 2019-06-16 DIAGNOSIS — E785 Hyperlipidemia, unspecified: Secondary | ICD-10-CM

## 2019-07-08 ENCOUNTER — Ambulatory Visit (HOSPITAL_COMMUNITY)
Admission: RE | Admit: 2019-07-08 | Discharge: 2019-07-08 | Disposition: A | Discharge status: Home / Self Care | Payer: Medicare Other | Source: Ambulatory Visit | Attending: Family Medicine | Admitting: Family Medicine

## 2019-07-08 ENCOUNTER — Other Ambulatory Visit: Payer: Self-pay

## 2019-07-08 DIAGNOSIS — N2889 Other specified disorders of kidney and ureter: Secondary | ICD-10-CM | POA: Diagnosis not present

## 2019-07-08 MED ORDER — GADOBUTROL 1 MMOL/ML IV SOLN
10.0000 mL | Freq: Once | INTRAVENOUS | Status: AC | PRN
  Administered 2019-07-08: 10 mL via INTRAVENOUS

## 2019-07-11 ENCOUNTER — Telehealth: Payer: Self-pay | Admitting: *Deleted

## 2019-07-11 NOTE — Telephone Encounter (Signed)
[facility] called with results on MRI of the abdomen. 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 Recommended urology consult.

## 2019-07-12 ENCOUNTER — Ambulatory Visit: Payer: Medicare Other | Admitting: Family Medicine

## 2019-07-12 NOTE — Telephone Encounter (Signed)
Noted, please place urology as urgent.

## 2019-07-12 NOTE — Telephone Encounter (Signed)
Referral sent [facility]

## 2019-07-13 ENCOUNTER — Other Ambulatory Visit: Payer: Self-pay

## 2019-07-13 ENCOUNTER — Encounter: Payer: Self-pay | Admitting: Urology

## 2019-07-13 ENCOUNTER — Ambulatory Visit (INDEPENDENT_AMBULATORY_CARE_PROVIDER_SITE_OTHER): Payer: Medicare Other | Admitting: Urology

## 2019-07-13 ENCOUNTER — Ambulatory Visit: Payer: Medicare Other | Admitting: Urology

## 2019-07-13 VITALS — BP 122/79 | HR 78 | Temp 97.2°F | Ht 65.0 in | Wt 235.0 lb

## 2019-07-13 DIAGNOSIS — N2889 Other specified disorders of kidney and ureter: Secondary | ICD-10-CM | POA: Diagnosis not present

## 2019-07-13 LAB — POCT URINALYSIS DIPSTICK
Bilirubin, UA: NEGATIVE
Blood, UA: NEGATIVE
Glucose, UA: NEGATIVE
Leukocytes, UA: NEGATIVE
Nitrite, UA: NEGATIVE
Protein, UA: POSITIVE — AB
Spec Grav, UA: 1.02 (ref 1.010–1.025)
Urobilinogen, UA: 0.2 E.U./dL
pH, UA: 5 (ref 5.0–8.0)

## 2019-07-13 LAB — BLADDER SCAN AMB NON-IMAGING: Scan Result: 9.3

## 2019-07-13 NOTE — Progress Notes (Signed)
07/13/2019 3:14 PM   [name] 1955/03/14 [id number]  Referring provider: [name], MD [address] [place],  [place] [address]  Right renal mass  HPI: [name] is a 64yo here for evaluation of a right renal mass. She had an elevated Creatinine of 1.5-1.7. CT from 05/02/2019 showed a possible right endophytic renal mass. MRI from 5/124 shows a 3cm mid pole endophytic renal mass. No flank pain. No family hx of RCC. No LUTS. She has hypertension   PMH: Past Medical History:  Diagnosis Date  . Depression   . Fractures   . Hyperlipidemia   . Hypertension   . Mental retardation, mild (I.Q. 50-70)   . Obesity   . Seizure ([place])    11/17/2012 "still having them; "I believe their from a tumor in my head" ((11/17/2012  . Seizure disorder ([place])   . Thyroid cancer [facility])     Surgical History: Past Surgical History:  Procedure Laterality Date  . ABDOMINAL HYSTERECTOMY  2001  . BREAST LUMPECTOMY  1996   right   . COLONOSCOPY N/A 05/16/2016   Procedure: COLONOSCOPY;  Surgeon: Danie Binder, MD;  [facility];  Service: Endoscopy;  Laterality: N/A;  830   . THYROIDECTOMY Bilateral 11/17/2012   Procedure: TOTAL THYROIDECTOMY;  Surgeon: Ascencion Dike, MD;  Location: [facility];  Service: ENT;  Laterality: Bilateral;  . TOTAL THYROIDECTOMY Bilateral 11/17/2012  . TUBAL LIGATION      Home Medications:  Allergies as of 07/13/2019      Reactions   Simvastatin Other (See Comments)   Muscle aches      Medication List       Accurate as of Jul 13, 2019  3:14 PM. If you have any questions, ask your nurse or doctor.        acetaminophen 500 MG tablet Commonly known as: TYLENOL Take 1 tablet (500 mg total) by mouth 2 (two) times daily. What changed:   when to take this  reasons to take this   amLODipine 5 MG tablet Commonly known as: NORVASC Take 1 tablet (5 mg total) by mouth daily.   benazepril 20 MG tablet Commonly known as: LOTENSIN TAKE 1  TABLET BY MOUTH DAILY.   calcitRIOL 0.25 MCG capsule Commonly known as: ROCALTROL Take 0.25 mcg by mouth daily. \   ezetimibe 10 MG tablet Commonly known as: ZETIA Take 1 tablet (10 mg total) by mouth daily.   FLUoxetine 10 MG capsule Commonly known as: PROZAC Take 1 capsule (10 mg total) by mouth daily.   hydrochlorothiazide 25 MG tablet Commonly known as: HYDRODIURIL Take by mouth.   LaMICtal 150 MG tablet Generic drug: lamoTRIgine Take 75 mg by mouth 2 (two) times daily. What changed: Another medication with the same name was removed. Continue taking this medication, and follow the directions you see here. Changed by: Nicolette Bang, MD   levETIRAcetam 750 MG tablet Commonly known as: KEPPRA Two tablets twice daily What changed:   how much to take  how to take this  when to take this  additional instructions   levothyroxine 137 MCG tablet Commonly known as: SYNTHROID Take 1 tablet (137 mcg total) by mouth daily before breakfast.   loratadine 10 MG tablet Commonly known as: CLARITIN TAKE ONE TABLET BY MOUTH ONCE DAILY.   omeprazole 20 MG capsule Commonly known as: PRILOSEC TAKE 1 CAPSULE BY MOUTH ONCE DAILY.   rosuvastatin 40 MG tablet Commonly known as: CRESTOR TAKE  1 TABLET BY MOUTH ONCE A DAY.   Vitamin D (Ergocalciferol) 1.25 MG (50000 UNIT) Caps capsule Commonly known as: DRISDOL Take 50,000 Units by mouth once a week.       Allergies:  Allergies  Allergen Reactions  . Simvastatin Other (See Comments)    Muscle aches    Family History: Family History  Problem Relation Age of Onset  . Diabetes Mother     Social History:  reports that she has never smoked. She has never used smokeless tobacco. She reports that she does not drink alcohol or use drugs.  ROS: All other review of systems were reviewed and are negative except what is noted above in HPI  Physical Exam: BP 122/79   Pulse 78   Temp (!) 97.2 F (36.2 C)   Ht 5\' 5"   (1.651 m)   Wt 235 lb (106.6 kg)   BMI 39.11 kg/m   Constitutional:  Alert and oriented, No acute distress. HEENT:  AT, moist mucus membranes.  Trachea midline, no masses. Cardiovascular: No clubbing, cyanosis, or edema. Respiratory: Normal respiratory effort, no increased work of breathing. GI: Abdomen is soft, nontender, nondistended, no abdominal masses GU: No CVA tenderness.  Lymph: No cervical or inguinal lymphadenopathy. Skin: No rashes, bruises or suspicious lesions. Neurologic: Grossly intact, no focal deficits, moving all 4 extremities. Psychiatric: Normal mood and affect.  Laboratory Data: Lab Results  Component Value Date   WBC 4.0 05/02/2019   HGB 12.1 05/02/2019   HCT 40.1 05/02/2019   MCV 92.4 05/02/2019   PLT 206 05/02/2019    Lab Results  Component Value Date   CREATININE 1.48 (H) 05/03/2019    No results found for: PSA  No results found for: TESTOSTERONE  Lab Results  Component Value Date   HGBA1C 5.2 05/26/2017    Urinalysis    Component Value Date/Time   COLORURINE YELLOW 05/02/2019 0917   APPEARANCEUR CLEAR 05/02/2019 0917   LABSPEC 1.028 05/02/2019 0917   PHURINE 6.0 05/02/2019 0917   GLUCOSEU NEGATIVE 05/02/2019 0917   HGBUR NEGATIVE 05/02/2019 0917   HGBUR small 04/10/2009 0845   BILIRUBINUR neg 07/13/2019 1512   KETONESUR negative 05/10/2019 1011   KETONESUR NEGATIVE 05/02/2019 0917   PROTEINUR Positive (A) 07/13/2019 1512   PROTEINUR 30 (A) 05/02/2019 0917   UROBILINOGEN 0.2 07/13/2019 1512   UROBILINOGEN 0.2 04/10/2009 0845   NITRITE neg 07/13/2019 1512   NITRITE NEGATIVE 05/02/2019 0917   LEUKOCYTESUR Negative 07/13/2019 1512   LEUKOCYTESUR SMALL (A) 05/02/2019 0917    Lab Results  Component Value Date   BACTERIA NONE SEEN 05/02/2019    Pertinent Imaging: MRI 07/08/2019: Images reviewed with patient No results found for this or any previous visit. No results found for this or any previous visit. No results found for  this or any previous visit. No results found for this or any previous visit. Results for orders placed during the hospital encounter of 06/18/16  US Renal   Narrative CLINICAL DATA:  Chronic kidney disease stage III, hypertension, history thyroid cancer  EXAM: RENAL / URINARY TRACT ULTRASOUND COMPLETE  COMPARISON:  CT abdomen and pelvis 04/06/2016  FINDINGS: Right Kidney:  Length: 10.4 cm. Mild cortical thinning. Increased cortical echogenicity. Cyst at upper pole 3.4 x 3.0 x 3.8 cm, simple features. Additional hypoechoic nodule upper pole 2.2 x 1.9 x 2.2 cm containing low level internal echoes question complicated cyst versus solid mass was lowin attenuation on CT consistent with minimally complicated cyst. No hydronephrosis or shadowing calculi.  Additional mildly complicated cysts containing low level internal echoes are also seen in the mid RIGHT kidney 2.2 cm greatest diameter and at the lower pole 1.7 cm greatest diameter.  Left Kidney:  Length: 9.6 cm. Cortical thinning. Increased cortical echogenicity. No mass, hydronephrosis or shadowing calcification.  Bladder:  Contains minimal urine, grossly unremarkable.  IMPRESSION: Medical renal disease changes of both kidneys.  RIGHT renal cysts including a 3.8 cm simple cyst at the upper pole and multiple additional smaller complicated cyst containing low level internal echoes.   Electronically Signed   By: Lavonia Dana M.D.   On: 06/18/2016 09:51    No results found for this or any previous visit. No results found for this or any previous visit. No results found for this or any previous visit.  Assessment & Plan:    1. Renal mass -We discussed the natural hx of renal masses and the 80/20 malignant/benign likelihood. We disucssed the treatment options including active surveillance. Renal ablation, partial and radical nephrectomy. Due to the patients CKD she would benefits from partial nephrectomy versus ablation. I  will refer her to Dr. Tresa Moore at [place] for consideration of partial nephrectomy.   - POCT urinalysis dipstick - BLADDER SCAN AMB NON-IMAGING   No follow-ups on file.  Nicolette Bang, MD  [facility] [place]

## 2019-07-13 NOTE — Progress Notes (Signed)
Urological Symptom Review  Patient is experiencing the following symptoms: Frequent urination Get up at night to urinate   Review of Systems  Gastrointestinal (upper)  : Negative for upper GI symptoms  Gastrointestinal (lower) : Negative for lower GI symptoms  Constitutional : Negative for symptoms  Skin: Negative for skin symptoms  Eyes: Negative for eye symptoms  Ear/Nose/Throat : Negative for ear/nose/throat symptoms  Hematologic/Lymphatic: Negative for Hematologic/Lymphatic symptoms  Cardiovascular : Negative for cardiovascular symptoms  Respiratory : Cough  Endocrine: Excessive thirst  Musculoskeletal: Negative for musculoskeletal symptoms  Neurological: Negative for neurological symptoms  Psychologic: Negative for psychiatric symptoms

## 2019-07-13 NOTE — Patient Instructions (Signed)
Renal Mass  A renal mass is a growth in the kidney. A renal mass may be found while performing an MRI, CT scan, or ultrasound for other problems of the abdomen. Certain types of cancers, infections, or injuries can cause a renal mass. A renal mass that is cancerous (malignant) may grow or spread quickly. Others are harmless (benign). What are common types of renal masses? Renal masses include:  Tumors. These may be cancerous (malignant) or noncancerous (benign). ? The most common type of kidney cancer is renal cell carcinoma. ? The most common benign tumors of the kidney include renal adenomas, oncocytomas, and angiomyolipoma (AML).  Cysts. These are fluid-filled sacs that form on or in the kidney. ? It is not always known what causes a cyst to develop in or on the kidney. ? Most kidney cysts do not cause symptoms and do not need to be treated. What type of testing might I need? Your health care provider may recommend that you have tests to diagnose the cause of your renal mass. The following tests may be done if a renal mass is found:  Physical exam.  Blood tests.  Urine tests.  Imaging tests, such as ultrasound, CT scan, or MRI.  Biopsy. This is a small sample that is removed from the renal mass and tested in a lab. The exact tests and how often they are done will depend on:  The size and appearance of the renal mass.  Risk factors or medical conditions that increase your risk for problems.  Any symptoms associated with the renal mass, or concerns that you have about it. Tests and physical exams may be done once, or they may be done regularly for a period of time. Tests and exams that are done regularly will help monitor whether the mass is growing and beginning to cause problems. What are common treatments for renal masses? Treatment is not always needed for this condition. Your health care provider may recommend careful monitoring (watchful waiting) and regular tests and exams.  Treatment will depend on the cause of the mass. Follow these instructions at home: What you need to do at home will depend on the cause of the mass. Follow the instructions that your health care provider gives to you. In general:  Take over-the-counter and prescription medicines only as told by your health care provider.  If you are prescribed an antibiotic medicine, take it as told by your health care provider. Do not stop taking the antibiotic even if you start to feel better.  Follow any restrictions that are given to you by your health care provider.  Keep all follow-up visits as told by your health care provider. This is important. ? You may need to see your health care provider once or twice a year to have CT scans and ultrasounds done. These tests will show if your renal mass has changed or grown bigger. Contact a health care provider if you:  Have pain in the side or back (flank pain).  Have a fever.  Feel full soon after eating.  Have pain or swelling in the abdomen.  Lose weight. Get help right away if:  Your pain gets worse.  There is blood in your urine.  You cannot urinate.  You have chest pain.  You have trouble breathing. Summary  A renal mass is a growth in the kidney. It may be cancerous (malignant) and grow or spread quickly, or it may be harmless (benign).  Renal masses may be found while performing   an MRI, CT scan, or ultrasound for other problems of the abdomen.  Your health care provider may recommend that you have tests to diagnose the cause of your renal mass. This may include a physical exam, blood tests, urine tests, imaging, or a biopsy.  Treatment is not always needed for this condition. Careful monitoring (watchful waiting) may be recommended. This information is not intended to replace advice given to you by your health care provider. Make sure you discuss any questions you have with your health care provider. Document Revised: 03/19/2017  Document Reviewed: 03/19/2017 Elsevier Patient Education  2020 [employer]  

## 2019-07-22 ENCOUNTER — Other Ambulatory Visit: Payer: Self-pay | Admitting: Urology

## 2019-07-27 ENCOUNTER — Ambulatory Visit: Payer: Medicare Other | Admitting: Internal Medicine

## 2019-07-28 ENCOUNTER — Other Ambulatory Visit: Payer: Self-pay

## 2019-07-28 ENCOUNTER — Encounter: Payer: Self-pay | Admitting: Internal Medicine

## 2019-07-28 ENCOUNTER — Ambulatory Visit (INDEPENDENT_AMBULATORY_CARE_PROVIDER_SITE_OTHER): Payer: Medicare Other | Admitting: Internal Medicine

## 2019-07-28 VITALS — BP 146/88 | HR 92 | Temp 97.2°F | Resp 15 | Ht 65.0 in | Wt 238.1 lb

## 2019-07-28 DIAGNOSIS — G40909 Epilepsy, unspecified, not intractable, without status epilepticus: Secondary | ICD-10-CM | POA: Diagnosis not present

## 2019-07-28 DIAGNOSIS — N2889 Other specified disorders of kidney and ureter: Secondary | ICD-10-CM

## 2019-07-28 DIAGNOSIS — N1832 Chronic kidney disease, stage 3b: Secondary | ICD-10-CM

## 2019-07-28 DIAGNOSIS — K219 Gastro-esophageal reflux disease without esophagitis: Secondary | ICD-10-CM

## 2019-07-28 DIAGNOSIS — E785 Hyperlipidemia, unspecified: Secondary | ICD-10-CM

## 2019-07-28 DIAGNOSIS — I10 Essential (primary) hypertension: Secondary | ICD-10-CM

## 2019-07-28 DIAGNOSIS — E890 Postprocedural hypothyroidism: Secondary | ICD-10-CM

## 2019-07-28 NOTE — Patient Instructions (Addendum)
DUE TO COVID-19 ONLY ONE VISITOR IS ALLOWED TO COME WITH YOU AND STAY IN THE WAITING ROOM ONLY DURING PRE OP AND PROCEDURE DAY OF SURGERY. TWo VISITOR MAY VISIT WITH YOU AFTER SURGERY IN YOUR PRIVATE ROOM DURING VISITING HOURS ONLY!  10a-8p  YOU NEED TO HAVE A COVID 19 TEST ON___6-8-21____ @__0900  am_____, THIS TEST MUST BE DONE BEFORE SURGERY, COME  [address], [place] , [address].  ([place]) ONCE YOUR COVID TEST IS COMPLETED, PLEASE BEGIN THE QUARANTINE INSTRUCTIONS AS OUTLINED IN YOUR HANDOUT.                [name]  07/28/2019   Your procedure is scheduled on: 08-05-19   Report to [facility] Main  Entrance   Report to admitting at       0830  AM     Call this number if you have problems the morning of surgery [phone]    Remember: Follow a clear liquid diet the day before your surgery and drink one 8 oz bottle of magnesium citrate by noon the day before surgery    CLEAR LIQUID DIET   Foods Allowed                                                                                     Foods Excluded  Coffee and tea, regular and decaf   No creamer                           liquids that you cannot  Plain Jell-O any favor except red or purple                                           see through such as: Fruit ices (not with fruit pulp)                                                         milk, soups, orange juice  Iced Popsicles                                                           All solid food Carbonated beverages, regular and diet                                    Cranberry, grape and apple juices Sports drinks like Gatorade Lightly seasoned clear broth or consume(fat free) Sugar, honey syrup  Sample Menu Breakfast  Lunch                                     Supper Cranberry juice                    Beef broth                            Chicken broth Jell-O                                      Grape juice                           Apple juice Coffee or tea                        Jell-O                                      Popsicle                                                Coffee or tea                        Coffee or tea  _____________________________________________________________________     BRUSH YOUR TEETH MORNING OF SURGERY AND RINSE YOUR MOUTH OUT, NO CHEWING GUM CANDY OR MINTS.     Take these medicines the morning of surgery with A SIP OF WATER:amlodipine , synthyroid                                  You may not have any metal on your body including hair pins and              piercings  Do not wear jewelry, make-up, lotions, powders or perfumes, deodorant             Do not wear nail polish on your fingernails.  Do not shave  48 hours prior to surgery.     Do not bring valuables to the hospital. [place].  Contacts, dentures or bridgework may not be worn into surgery.      Patients discharged the day of surgery will not be allowed to drive home. IF YOU ARE HAVING SURGERY AND GOING HOME THE SAME DAY, YOU MUST HAVE AN ADULT TO DRIVE YOU HOME AND BE WITH YOU FOR 24 HOURS. YOU MAY GO HOME BY TAXI OR UBER OR ORTHERWISE, BUT AN ADULT MUST ACCOMPANY YOU HOME AND STAY WITH YOU FOR 24 HOURS.  Name and phone number of your driver:  Special Instructions: N/A              Please read over the following fact sheets you were given: _____________________________________________________________________  [facility] - Preparing for Surgery Before surgery, you can play an important role.  Because skin is not sterile, your skin needs to be as free of germs as possible.  You can reduce the number of germs on your skin by washing with CHG (chlorahexidine gluconate) soap before surgery.  CHG is an antiseptic cleaner which kills germs and bonds with the skin to continue killing germs even after washing. Please DO NOT  use if you have an allergy to CHG or antibacterial soaps.  If your skin becomes reddened/irritated stop using the CHG and inform your nurse when you arrive at Short Stay. Do not shave (including legs and underarms) for at least 48 hours prior to the first CHG shower.  You may shave your face/neck. Please follow these instructions carefully:  1.  Shower with CHG Soap the night before surgery and the  morning of Surgery.  2.  If you choose to wash your hair, wash your hair first as usual with your  normal  shampoo.  3.  After you shampoo, rinse your hair and body thoroughly to remove the  shampoo.                           4.  Use CHG as you would any other liquid soap.  You can apply chg directly  to the skin and wash                       Gently with a scrungie or clean washcloth.  5.  Apply the CHG Soap to your body ONLY FROM THE NECK DOWN.   Do not use on face/ open                           Wound or open sores. Avoid contact with eyes, ears mouth and genitals (private parts).                       Wash face,  Genitals (private parts) with your normal soap.             6.  Wash thoroughly, paying special attention to the area where your surgery  will be performed.  7.  Thoroughly rinse your body with warm water from the neck down.  8.  DO NOT shower/wash with your normal soap after using and rinsing off  the CHG Soap.                9.  Pat yourself dry with a clean towel.            10.  Wear clean pajamas.            11.  Place clean sheets on your bed the night of your first shower and do not  sleep with pets. Day of Surgery : Do not apply any lotions/deodorants the morning of surgery.  Please wear clean clothes to the hospital/surgery center.  FAILURE TO FOLLOW THESE INSTRUCTIONS MAY RESULT IN THE CANCELLATION OF YOUR SURGERY PATIENT SIGNATURE_________________________________  NURSE  SIGNATURE__________________________________  ________________________________________________________________________

## 2019-07-28 NOTE — Patient Instructions (Signed)
No changes in meds Follow up with endocrinology as per scheduled Follow up in 4 weeks/after the surgery Take BP meds everyday Advised DASH diet, exercise, weight loss GOOD LUCK for the surgery!!

## 2019-07-28 NOTE — Progress Notes (Signed)
Established Patient Office Visit  Subjective:  Patient ID: [name], female    DOB: 19-Mar-1955  Age: 64 y.o. MRN: [id number]  CC:  Chief Complaint  Patient presents with  . Hypertension    follow up  . Hyperlipidemia    HPI [name] is a pleasant 64 year old female with past medical history of hypertension, hyperlipidemia, morbid obesity with BMI of 39, right renal mass, CKD stage IIIb, papillary thyroid carcinoma status post thyroidectomy, presents today for follow-up on her chronic medical issues.  Today patient tells me that she is doing fine, denies any complaints.  She is scheduled for right nephrectomy on 08/05/2019 at [facility].   She denies abdominal pain, decreased appetite, weight loss, urinary symptoms such as dysuria, change in urinary frequency, hematuria, fever, chills, nausea, vomiting.  She also denies headache, blurry vision, chest pain, shortness of breath, palpitation, leg swelling, orthopnea, PND, weight changes, bowel changes.  She has history of papillary thyroid carcinoma status post thyroidectomy-followed by endocrinologist Dr. Dorris Fetch.  She tells me that she has an appointment in September.  She denies smoking, alcohol, illicit drug use.  She is up-to-date on immunization.  She received COVID-19 vaccine last month.  She is due for DEXA scan which we will discuss on next visit.  Past Medical History:  Diagnosis Date  . Depression   . Fractures   . Hyperlipidemia   . Hypertension   . Mental retardation, mild (I.Q. 50-70)   . Obesity   . Seizure ([place])    11/17/2012 "still having them; "I believe their from a tumor in my head" ((11/17/2012  . Seizure disorder ([place])   . Thyroid cancer [facility])     Past Surgical History:  Procedure Laterality Date  . ABDOMINAL HYSTERECTOMY  2001  . BREAST LUMPECTOMY  1996   right   . COLONOSCOPY N/A 05/16/2016   Procedure: COLONOSCOPY;  Surgeon: Danie Binder, MD;  [facility];  Service: Endoscopy;   Laterality: N/A;  830   . THYROIDECTOMY Bilateral 11/17/2012   Procedure: TOTAL THYROIDECTOMY;  Surgeon: Ascencion Dike, MD;  Location: [place];  Service: ENT;  Laterality: Bilateral;  . TOTAL THYROIDECTOMY Bilateral 11/17/2012  . TUBAL LIGATION      Family History  Problem Relation Age of Onset  . Diabetes Mother     Social History   Socioeconomic History  . Marital status: Single    Spouse name: Not on file  . Number of children: 2  . Years of education: Not on file  . Highest education level: Not on file  Occupational History  . Occupation: disabled   Tobacco Use  . Smoking status: Never Smoker  . Smokeless tobacco: Never Used  Substance and Sexual Activity  . Alcohol use: No  . Drug use: No  . Sexual activity: Never  Other Topics Concern  . Not on file  Social History Narrative  . Not on file   Social Determinants of Health   Financial Resource Strain:   . Difficulty of Paying Living Expenses:   Food Insecurity:   . Worried About Charity fundraiser in the Last Year:   . Arboriculturist in the Last Year:   Transportation Needs:   . Film/video editor (Medical):   Marland Kitchen Lack of Transportation (Non-Medical):   Physical Activity:   . Days of Exercise per Week:   . Minutes of Exercise per Session:   Stress:   . Feeling of Stress :  Social Connections:   . Frequency of Communication with Friends and Family:   . Frequency of Social Gatherings with Friends and Family:   . Attends Religious Services:   . Active Member of Clubs or Organizations:   . Attends Archivist Meetings:   Marland Kitchen Marital Status:   Intimate Partner Violence:   . Fear of Current or Ex-Partner:   . Emotionally Abused:   Marland Kitchen Physically Abused:   . Sexually Abused:     Outpatient Medications Prior to Visit  Medication Sig Dispense Refill  . acetaminophen (TYLENOL) 500 MG tablet Take 1 tablet (500 mg total) by mouth 2 (two) times daily. (Patient taking differently: Take 500 mg by mouth 2  (two) times daily as needed for mild pain or headache. ) 60 tablet 5  . amLODipine (NORVASC) 5 MG tablet Take 1 tablet (5 mg total) by mouth daily. 30 tablet 5  . benazepril (LOTENSIN) 20 MG tablet TAKE 1 TABLET BY MOUTH DAILY. 30 tablet 5  . calcitRIOL (ROCALTROL) 0.25 MCG capsule Take 0.25 mcg by mouth daily. \    . ezetimibe (ZETIA) 10 MG tablet Take 1 tablet (10 mg total) by mouth daily. 30 tablet 5  . FLUoxetine (PROZAC) 10 MG capsule Take 1 capsule (10 mg total) by mouth daily. 30 capsule 5  . hydrochlorothiazide (HYDRODIURIL) 25 MG tablet Take by mouth.    Marland Kitchen LAMICTAL 150 MG tablet Take 75 mg by mouth 2 (two) times daily.    Marland Kitchen levETIRAcetam (KEPPRA) 750 MG tablet Two tablets twice daily (Patient taking differently: Take 1,500 mg by mouth 2 (two) times daily. ) 120 tablet 3  . levothyroxine (SYNTHROID) 137 MCG tablet Take 1 tablet (137 mcg total) by mouth daily before breakfast. 90 tablet 1  . loratadine (CLARITIN) 10 MG tablet TAKE ONE TABLET BY MOUTH ONCE DAILY. 30 tablet 5  . omeprazole (PRILOSEC) 20 MG capsule TAKE 1 CAPSULE BY MOUTH ONCE DAILY. 30 capsule 5  . rosuvastatin (CRESTOR) 40 MG tablet TAKE 1 TABLET BY MOUTH ONCE A DAY. 90 tablet 1  . Vitamin D, Ergocalciferol, (DRISDOL) 1.25 MG (50000 UNIT) CAPS capsule Take 50,000 Units by mouth once a week.     No facility-administered medications prior to visit.    Allergies  Allergen Reactions  . Simvastatin Other (See Comments)    Muscle aches    ROS Review of Systems  Constitutional: Negative.   HENT: Negative.   Eyes: Negative.   Respiratory: Negative.   Cardiovascular: Negative.   Gastrointestinal: Negative.   Endocrine: Negative.   Genitourinary: Negative.   Musculoskeletal: Negative.   Allergic/Immunologic: Negative.   Neurological: Negative.   Hematological: Negative.   Psychiatric/Behavioral: Negative.       Objective:    Physical Exam  Constitutional: She is oriented to person, place, and time. She  appears well-developed and well-nourished.  obese  HENT:  Head: Normocephalic and atraumatic.  Eyes: Pupils are equal, round, and reactive to light. Conjunctivae and EOM are normal.  Cardiovascular: Normal rate and regular rhythm.  Murmur heard. Pulmonary/Chest: Effort normal and breath sounds normal.  Abdominal: Soft. Bowel sounds are normal.  Musculoskeletal:        General: Normal range of motion.     Cervical back: Normal range of motion and neck supple.  Neurological: She is alert and oriented to person, place, and time.  Psychiatric: She has a normal mood and affect. Her behavior is normal. Judgment normal.    BP (!) 146/88   Pulse 92  Temp (!) 97.2 F (36.2 C) (Temporal)   Resp 15   Ht 5\' 5"  (1.651 m)   Wt 238 lb 1.9 oz (108 kg)   SpO2 96%   BMI 39.63 kg/m  Wt Readings from Last 3 Encounters:  07/28/19 238 lb 1.9 oz (108 kg)  07/13/19 235 lb (106.6 kg)  05/10/19 236 lb (107 kg)     Health Maintenance Due  Topic Date Due  . DEXA SCAN  Never done  . COVID-19 Vaccine (2 - Inadvertent mRNA 2-dose series) 06/27/2019    There are no preventive care reminders to display for this patient.  Lab Results  Component Value Date   TSH 0.16 (L) 05/05/2019   Lab Results  Component Value Date   WBC 4.0 05/02/2019   HGB 12.1 05/02/2019   HCT 40.1 05/02/2019   MCV 92.4 05/02/2019   PLT 206 05/02/2019   Lab Results  Component Value Date   NA 139 05/03/2019   K 3.8 05/03/2019   CO2 24 05/03/2019   GLUCOSE 97 05/03/2019   BUN 21 05/03/2019   CREATININE 1.48 (H) 05/03/2019   BILITOT 0.5 05/03/2019   ALKPHOS 96 05/02/2019   AST 15 05/03/2019   ALT 11 05/03/2019   PROT 7.6 05/03/2019   ALBUMIN 3.8 05/02/2019   CALCIUM 9.9 05/03/2019   ANIONGAP 10 05/02/2019   Lab Results  Component Value Date   CHOL 140 05/03/2019   Lab Results  Component Value Date   HDL 51 05/03/2019   Lab Results  Component Value Date   LDLCALC 76 05/03/2019   Lab Results    Component Value Date   TRIG 58 05/03/2019   Lab Results  Component Value Date   CHOLHDL 2.7 05/03/2019   Lab Results  Component Value Date   HGBA1C 5.2 05/26/2017      Assessment & Plan:   Problem List Items Addressed This Visit      Cardiovascular and Mediastinum   Essential hypertension - Primary     Digestive   GERD (gastroesophageal reflux disease)     Endocrine   Hypothyroidism, postsurgical     Nervous and Auditory   Seizure disorder (HCC)     Genitourinary   CKD (chronic kidney disease) stage 3, GFR 30-59 ml/min     Other   Hyperlipidemia LDL goal <100   Morbid obesity ([place])   Renal mass, right     Right renal mass: -Concerning for papillary renal cell carcinoma.  She is scheduled for right nephrectomy at [facility] long on 08/05/2019. -She denies abdominal pain, urinary symptoms such as dysuria or hematuria, decreased appetite or weight loss.  Hypertension: Blood pressure is 146/88 -She tells me that she did not take her meds this morning. -Continue amlodipine, benazepril, HCTZ. -Advised DASH diet, exercise and weight loss.  Hyperlipidemia: Continue statin  GERD: Continue PPI  Seizure disorder: Continue Keppra and Lamictal  History of papillary thyroid carcinoma: Status post thyroidectomy -Continue levothyroxine.  Followed by endocrinology Dr. Dorris Fetch  Depression:: Stable -Continue Prozac  CKD stage IIIb: Baseline -Followed by nephrology.  No orders of the defined types were placed in this encounter.   Follow-up: Return in about 4 weeks (around 08/25/2019).    Mckinley Jewel, MD

## 2019-07-29 ENCOUNTER — Other Ambulatory Visit: Payer: Self-pay | Admitting: Urology

## 2019-07-29 ENCOUNTER — Encounter (HOSPITAL_COMMUNITY): Payer: Self-pay

## 2019-07-29 ENCOUNTER — Encounter (HOSPITAL_COMMUNITY)
Admission: RE | Admit: 2019-07-29 | Discharge: 2019-07-29 | Disposition: A | Discharge status: Home / Self Care | Payer: Medicare Other | Source: Ambulatory Visit | Attending: Urology | Admitting: Urology

## 2019-07-29 ENCOUNTER — Other Ambulatory Visit: Payer: Self-pay

## 2019-07-29 DIAGNOSIS — Z01818 Encounter for other preprocedural examination: Secondary | ICD-10-CM | POA: Insufficient documentation

## 2019-07-29 HISTORY — DX: Pneumonia, unspecified organism: J18.9

## 2019-08-01 ENCOUNTER — Encounter (HOSPITAL_COMMUNITY)
Admission: RE | Admit: 2019-08-01 | Discharge: 2019-08-01 | Disposition: A | Discharge status: Home / Self Care | Payer: Medicare Other | Source: Ambulatory Visit | Attending: Urology | Admitting: Urology

## 2019-08-01 ENCOUNTER — Other Ambulatory Visit: Payer: Self-pay

## 2019-08-01 DIAGNOSIS — Z01818 Encounter for other preprocedural examination: Secondary | ICD-10-CM | POA: Diagnosis not present

## 2019-08-01 LAB — CBC
HCT: 38.3 % (ref 36.0–46.0)
Hemoglobin: 11.8 g/dL — ABNORMAL LOW (ref 12.0–15.0)
MCH: 27.6 pg (ref 26.0–34.0)
MCHC: 30.8 g/dL (ref 30.0–36.0)
MCV: 89.7 fL (ref 80.0–100.0)
Platelets: 209 10*3/uL (ref 150–400)
RBC: 4.27 MIL/uL (ref 3.87–5.11)
RDW: 14.8 % (ref 11.5–15.5)
WBC: 3.7 10*3/uL — ABNORMAL LOW (ref 4.0–10.5)
nRBC: 0 % (ref 0.0–0.2)

## 2019-08-01 LAB — BASIC METABOLIC PANEL
Anion gap: 10 (ref 5–15)
BUN: 35 mg/dL — ABNORMAL HIGH (ref 8–23)
CO2: 22 mmol/L (ref 22–32)
Calcium: 9.7 mg/dL (ref 8.9–10.3)
Chloride: 109 mmol/L (ref 98–111)
Creatinine, Ser: 1.56 mg/dL — ABNORMAL HIGH (ref 0.44–1.00)
GFR calc Af Amer: 40 mL/min — ABNORMAL LOW (ref >60)
GFR calc non Af Amer: 35 mL/min — ABNORMAL LOW (ref >60)
Glucose, Bld: 88 mg/dL (ref 70–99)
Potassium: 4.2 mmol/L (ref 3.5–5.1)
Sodium: 141 mmol/L (ref 135–145)

## 2019-08-01 NOTE — Progress Notes (Signed)
PCP - [name] lov 07-28-19 epic Cardiologist -   PPM/ICD -  Device Orders -  Rep Notified -   Chest x-ray -  EKG - 08-01-19 epic Stress Test -  ECHO -  Cardiac Cath -   Sleep Study -  CPAP -   Fasting Blood Sugar -  Checks Blood Sugar _____ times a day  Blood Thinner Instructions: Aspirin Instructions:  ERAS Protcol - PRE-SURGERY Ensure or G2-   COVID TEST-    Anesthesia review:  Patient denies shortness of breath, fever, cough and chest pain at PAT appointment none   All instructions explained to the patient, with a verbal understanding of the material. Patient agrees to go over the instructions while at home for a better understanding. Patient also instructed to self quarantine after being tested for COVID-19. The opportunity to ask questions was provided.

## 2019-08-02 ENCOUNTER — Telehealth: Payer: Self-pay | Admitting: Family Medicine

## 2019-08-02 ENCOUNTER — Other Ambulatory Visit (HOSPITAL_COMMUNITY)
Admission: RE | Admit: 2019-08-02 | Discharge: 2019-08-02 | Disposition: A | Discharge status: Home / Self Care | Payer: Medicare Other | Source: Ambulatory Visit | Attending: Urology | Admitting: Urology

## 2019-08-02 DIAGNOSIS — Z01812 Encounter for preprocedural laboratory examination: Secondary | ICD-10-CM | POA: Diagnosis present

## 2019-08-02 DIAGNOSIS — Z20822 Contact with and (suspected) exposure to covid-19: Secondary | ICD-10-CM | POA: Insufficient documentation

## 2019-08-02 LAB — SARS CORONAVIRUS 2 (TAT 6-24 HRS): SARS Coronavirus 2: NEGATIVE

## 2019-08-05 ENCOUNTER — Encounter (HOSPITAL_COMMUNITY): Disposition: A | Discharge status: Home / Self Care | Payer: Self-pay | Source: Home / Self Care | Attending: Urology

## 2019-08-05 ENCOUNTER — Ambulatory Visit (HOSPITAL_COMMUNITY): Payer: Medicare Other | Admitting: Anesthesiology

## 2019-08-05 ENCOUNTER — Encounter (HOSPITAL_COMMUNITY): Payer: Self-pay | Admitting: Urology

## 2019-08-05 ENCOUNTER — Inpatient Hospital Stay (HOSPITAL_COMMUNITY)
Admit: 2019-08-05 | Discharge: 2019-08-08 | DRG: 657 | Disposition: A | Discharge status: Home / Self Care | Payer: Medicare Other | Attending: Urology | Admitting: Urology

## 2019-08-05 ENCOUNTER — Other Ambulatory Visit: Payer: Self-pay

## 2019-08-05 DIAGNOSIS — G40909 Epilepsy, unspecified, not intractable, without status epilepticus: Secondary | ICD-10-CM | POA: Diagnosis not present

## 2019-08-05 DIAGNOSIS — R6250 Unspecified lack of expected normal physiological development in childhood: Secondary | ICD-10-CM | POA: Diagnosis not present

## 2019-08-05 DIAGNOSIS — K219 Gastro-esophageal reflux disease without esophagitis: Secondary | ICD-10-CM | POA: Diagnosis present

## 2019-08-05 DIAGNOSIS — E890 Postprocedural hypothyroidism: Secondary | ICD-10-CM | POA: Diagnosis not present

## 2019-08-05 DIAGNOSIS — Z888 Allergy status to other drugs, medicaments and biological substances status: Secondary | ICD-10-CM | POA: Diagnosis not present

## 2019-08-05 DIAGNOSIS — Z6841 Body Mass Index (BMI) 40.0 and over, adult: Secondary | ICD-10-CM

## 2019-08-05 DIAGNOSIS — N281 Cyst of kidney, acquired: Secondary | ICD-10-CM | POA: Diagnosis present

## 2019-08-05 DIAGNOSIS — C641 Malignant neoplasm of right kidney, except renal pelvis: Principal | ICD-10-CM | POA: Diagnosis present

## 2019-08-05 DIAGNOSIS — Z85850 Personal history of malignant neoplasm of thyroid: Secondary | ICD-10-CM

## 2019-08-05 DIAGNOSIS — E785 Hyperlipidemia, unspecified: Secondary | ICD-10-CM | POA: Diagnosis present

## 2019-08-05 DIAGNOSIS — N2889 Other specified disorders of kidney and ureter: Secondary | ICD-10-CM | POA: Diagnosis present

## 2019-08-05 DIAGNOSIS — F329 Major depressive disorder, single episode, unspecified: Secondary | ICD-10-CM | POA: Diagnosis present

## 2019-08-05 DIAGNOSIS — Z20822 Contact with and (suspected) exposure to covid-19: Secondary | ICD-10-CM | POA: Diagnosis not present

## 2019-08-05 DIAGNOSIS — Z833 Family history of diabetes mellitus: Secondary | ICD-10-CM | POA: Diagnosis not present

## 2019-08-05 DIAGNOSIS — I10 Essential (primary) hypertension: Secondary | ICD-10-CM | POA: Diagnosis not present

## 2019-08-05 DIAGNOSIS — M1990 Unspecified osteoarthritis, unspecified site: Secondary | ICD-10-CM | POA: Diagnosis not present

## 2019-08-05 HISTORY — PX: ROBOTIC ASSITED PARTIAL NEPHRECTOMY: SHX6087

## 2019-08-05 LAB — TYPE AND SCREEN
ABO/RH(D): O POS
Antibody Screen: NEGATIVE

## 2019-08-05 LAB — ABO/RH: ABO/RH(D): O POS

## 2019-08-05 LAB — HEMOGLOBIN AND HEMATOCRIT, BLOOD
HCT: 37 % (ref 36.0–46.0)
Hemoglobin: 11.8 g/dL — ABNORMAL LOW (ref 12.0–15.0)

## 2019-08-05 SURGERY — NEPHRECTOMY, PARTIAL, ROBOT-ASSISTED
Anesthesia: General | Laterality: Right

## 2019-08-05 MED ORDER — CALCITRIOL 0.25 MCG PO CAPS
0.2500 ug | ORAL_CAPSULE | Freq: Every day | ORAL | Status: DC
  Administered 2019-08-05 – 2019-08-08 (×4): 0.25 ug via ORAL
  Filled 2019-08-05 (×4): qty 1

## 2019-08-05 MED ORDER — GLYCOPYRROLATE PF 0.2 MG/ML IJ SOSY
PREFILLED_SYRINGE | INTRAMUSCULAR | Status: AC
  Filled 2019-08-05: qty 1

## 2019-08-05 MED ORDER — ONDANSETRON HCL 4 MG/2ML IJ SOLN
4.0000 mg | INTRAMUSCULAR | Status: DC | PRN
  Filled 2019-08-05: qty 2

## 2019-08-05 MED ORDER — SCOPOLAMINE 1 MG/3DAYS TD PT72
1.0000 | MEDICATED_PATCH | Freq: Once | TRANSDERMAL | Status: DC
  Administered 2019-08-05: 1.5 mg via TRANSDERMAL

## 2019-08-05 MED ORDER — DIPHENHYDRAMINE HCL 50 MG/ML IJ SOLN: 12.5000 mg | Freq: Four times a day (QID) | INTRAMUSCULAR | Status: DC | PRN

## 2019-08-05 MED ORDER — PHENYLEPHRINE HCL (PRESSORS) 10 MG/ML IV SOLN
INTRAVENOUS | Status: AC
  Filled 2019-08-05: qty 1

## 2019-08-05 MED ORDER — ACETAMINOPHEN 500 MG PO TABS
1000.0000 mg | ORAL_TABLET | Freq: Four times a day (QID) | ORAL | Status: AC
  Administered 2019-08-05 – 2019-08-06 (×4): 1000 mg via ORAL
  Filled 2019-08-05 (×4): qty 2

## 2019-08-05 MED ORDER — FENTANYL CITRATE (PF) 100 MCG/2ML IJ SOLN
25.0000 ug | INTRAMUSCULAR | Status: DC | PRN
  Administered 2019-08-05 (×3): 50 ug via INTRAVENOUS

## 2019-08-05 MED ORDER — ORAL CARE MOUTH RINSE: 15.0000 mL | Freq: Once | OROMUCOSAL | Status: AC

## 2019-08-05 MED ORDER — ONDANSETRON HCL 4 MG/2ML IJ SOLN
INTRAMUSCULAR | Status: DC | PRN
  Administered 2019-08-05: 4 mg via INTRAVENOUS

## 2019-08-05 MED ORDER — AMLODIPINE BESYLATE 5 MG PO TABS
5.0000 mg | ORAL_TABLET | Freq: Every day | ORAL | Status: DC
  Administered 2019-08-05 – 2019-08-08 (×4): 5 mg via ORAL
  Filled 2019-08-05 (×4): qty 1

## 2019-08-05 MED ORDER — FLUOXETINE HCL 10 MG PO CAPS
10.0000 mg | ORAL_CAPSULE | Freq: Every day | ORAL | Status: DC
  Administered 2019-08-05 – 2019-08-07 (×3): 10 mg via ORAL
  Filled 2019-08-05 (×4): qty 1

## 2019-08-05 MED ORDER — ROSUVASTATIN CALCIUM 20 MG PO TABS
40.0000 mg | ORAL_TABLET | Freq: Every day | ORAL | Status: DC
  Administered 2019-08-05 – 2019-08-08 (×4): 40 mg via ORAL
  Filled 2019-08-05 (×4): qty 2

## 2019-08-05 MED ORDER — EPHEDRINE 5 MG/ML INJ
INTRAVENOUS | Status: AC
  Filled 2019-08-05: qty 10

## 2019-08-05 MED ORDER — PHENYLEPHRINE HCL (PRESSORS) 10 MG/ML IV SOLN
INTRAVENOUS | Status: DC | PRN
  Administered 2019-08-05 (×2): 80 ug via INTRAVENOUS

## 2019-08-05 MED ORDER — CEFAZOLIN SODIUM-DEXTROSE 2-4 GM/100ML-% IV SOLN
2.0000 g | INTRAVENOUS | Status: AC
  Administered 2019-08-05: 2 g via INTRAVENOUS
  Filled 2019-08-05: qty 100

## 2019-08-05 MED ORDER — FENTANYL CITRATE (PF) 100 MCG/2ML IJ SOLN: INTRAMUSCULAR | Status: DC | PRN

## 2019-08-05 MED ORDER — ROCURONIUM BROMIDE 100 MG/10ML IV SOLN
INTRAVENOUS | Status: DC | PRN
  Administered 2019-08-05: 20 mg via INTRAVENOUS
  Administered 2019-08-05: 10 mg via INTRAVENOUS
  Administered 2019-08-05: 60 mg via INTRAVENOUS

## 2019-08-05 MED ORDER — FENTANYL CITRATE (PF) 250 MCG/5ML IJ SOLN
INTRAMUSCULAR | Status: DC | PRN
  Administered 2019-08-05: 50 ug via INTRAVENOUS
  Administered 2019-08-05: 100 ug via INTRAVENOUS

## 2019-08-05 MED ORDER — PROMETHAZINE HCL 25 MG/ML IJ SOLN: 6.2500 mg | INTRAMUSCULAR | Status: DC | PRN

## 2019-08-05 MED ORDER — EPHEDRINE SULFATE 50 MG/ML IJ SOLN
INTRAMUSCULAR | Status: DC | PRN
  Administered 2019-08-05: 10 mg via INTRAVENOUS
  Administered 2019-08-05: 5 mg via INTRAVENOUS
  Administered 2019-08-05: 10 mg via INTRAVENOUS

## 2019-08-05 MED ORDER — PHENYLEPHRINE HCL-NACL 10-0.9 MG/250ML-% IV SOLN
INTRAVENOUS | Status: DC | PRN
Start: 2019-08-05 — End: 2019-08-05
  Administered 2019-08-05: 50 ug/min via INTRAVENOUS

## 2019-08-05 MED ORDER — FENTANYL CITRATE (PF) 100 MCG/2ML IJ SOLN
INTRAMUSCULAR | Status: AC
  Filled 2019-08-05: qty 4

## 2019-08-05 MED ORDER — LEVOTHYROXINE SODIUM 25 MCG PO TABS
137.0000 ug | ORAL_TABLET | Freq: Every day | ORAL | Status: DC
  Administered 2019-08-06 – 2019-08-08 (×3): 137 ug via ORAL
  Filled 2019-08-05 (×3): qty 1

## 2019-08-05 MED ORDER — CHLORHEXIDINE GLUCONATE CLOTH 2 % EX PADS
6.0000 | MEDICATED_PAD | Freq: Every day | CUTANEOUS | Status: DC
  Administered 2019-08-06: 6 via TOPICAL

## 2019-08-05 MED ORDER — PANTOPRAZOLE SODIUM 40 MG PO TBEC
40.0000 mg | DELAYED_RELEASE_TABLET | Freq: Every day | ORAL | Status: DC
  Administered 2019-08-05 – 2019-08-08 (×4): 40 mg via ORAL
  Filled 2019-08-05 (×4): qty 1

## 2019-08-05 MED ORDER — ACETAMINOPHEN 500 MG PO TABS
1000.0000 mg | ORAL_TABLET | Freq: Once | ORAL | Status: AC
  Administered 2019-08-05: 1000 mg via ORAL
  Filled 2019-08-05: qty 2

## 2019-08-05 MED ORDER — DOCUSATE SODIUM 100 MG PO CAPS
100.0000 mg | ORAL_CAPSULE | Freq: Two times a day (BID) | ORAL | Status: DC
  Administered 2019-08-05 – 2019-08-08 (×6): 100 mg via ORAL
  Filled 2019-08-05 (×7): qty 1

## 2019-08-05 MED ORDER — BELLADONNA ALKALOIDS-OPIUM 16.2-60 MG RE SUPP: 1.0000 | Freq: Four times a day (QID) | RECTAL | Status: DC | PRN

## 2019-08-05 MED ORDER — DIPHENHYDRAMINE HCL 12.5 MG/5ML PO ELIX: 12.5000 mg | ORAL_SOLUTION | Freq: Four times a day (QID) | ORAL | Status: DC | PRN

## 2019-08-05 MED ORDER — LIDOCAINE HCL (CARDIAC) PF 100 MG/5ML IV SOSY
PREFILLED_SYRINGE | INTRAVENOUS | Status: DC | PRN
  Administered 2019-08-05: 80 mg via INTRAVENOUS

## 2019-08-05 MED ORDER — EZETIMIBE 10 MG PO TABS
10.0000 mg | ORAL_TABLET | Freq: Every day | ORAL | Status: DC
  Administered 2019-08-05 – 2019-08-08 (×4): 10 mg via ORAL
  Filled 2019-08-05 (×4): qty 1

## 2019-08-05 MED ORDER — ONDANSETRON HCL 4 MG/2ML IJ SOLN
INTRAMUSCULAR | Status: AC
  Filled 2019-08-05: qty 2

## 2019-08-05 MED ORDER — MIDAZOLAM HCL 2 MG/2ML IJ SOLN
INTRAMUSCULAR | Status: AC
  Filled 2019-08-05: qty 2

## 2019-08-05 MED ORDER — DEXTROSE-NACL 5-0.45 % IV SOLN: INTRAVENOUS | Status: DC

## 2019-08-05 MED ORDER — KETAMINE HCL 10 MG/ML IJ SOLN
INTRAMUSCULAR | Status: DC | PRN
  Administered 2019-08-05: 30 mg via INTRAVENOUS

## 2019-08-05 MED ORDER — STERILE WATER FOR IRRIGATION IR SOLN
Status: DC | PRN
  Administered 2019-08-05: 1000 mL

## 2019-08-05 MED ORDER — LEVETIRACETAM 500 MG PO TABS
1500.0000 mg | ORAL_TABLET | Freq: Two times a day (BID) | ORAL | Status: DC
  Administered 2019-08-05 – 2019-08-08 (×6): 1500 mg via ORAL
  Filled 2019-08-05 (×8): qty 3

## 2019-08-05 MED ORDER — SCOPOLAMINE 1 MG/3DAYS TD PT72
MEDICATED_PATCH | TRANSDERMAL | Status: AC
  Filled 2019-08-05: qty 1

## 2019-08-05 MED ORDER — DEXAMETHASONE SODIUM PHOSPHATE 10 MG/ML IJ SOLN
INTRAMUSCULAR | Status: DC | PRN
Start: 2019-08-05 — End: 2019-08-05
  Administered 2019-08-05: 10 mg via INTRAVENOUS

## 2019-08-05 MED ORDER — HYDROMORPHONE HCL 1 MG/ML IJ SOLN
0.5000 mg | INTRAMUSCULAR | Status: DC | PRN
  Administered 2019-08-05 – 2019-08-06 (×3): 0.5 mg via INTRAVENOUS
  Administered 2019-08-06 – 2019-08-07 (×3): 1 mg via INTRAVENOUS
  Administered 2019-08-07 (×3): 0.5 mg via INTRAVENOUS
  Administered 2019-08-07 – 2019-08-08 (×2): 1 mg via INTRAVENOUS
  Filled 2019-08-05 (×11): qty 1

## 2019-08-05 MED ORDER — SODIUM CHLORIDE (PF) 0.9 % IJ SOLN
INTRAMUSCULAR | Status: AC
  Filled 2019-08-05: qty 20

## 2019-08-05 MED ORDER — OXYCODONE HCL 5 MG PO TABS
5.0000 mg | ORAL_TABLET | ORAL | Status: DC | PRN
  Administered 2019-08-05 – 2019-08-08 (×6): 5 mg via ORAL
  Filled 2019-08-05 (×6): qty 1

## 2019-08-05 MED ORDER — PROPOFOL 10 MG/ML IV BOLUS
INTRAVENOUS | Status: AC
  Filled 2019-08-05: qty 20

## 2019-08-05 MED ORDER — DIPHENHYDRAMINE HCL 50 MG/ML IJ SOLN
INTRAMUSCULAR | Status: DC | PRN
  Administered 2019-08-05: 12.5 mg via INTRAVENOUS

## 2019-08-05 MED ORDER — KETAMINE HCL 10 MG/ML IJ SOLN
INTRAMUSCULAR | Status: AC
  Filled 2019-08-05: qty 1

## 2019-08-05 MED ORDER — LACTATED RINGERS IR SOLN
Status: DC | PRN
  Administered 2019-08-05: 1000 mL

## 2019-08-05 MED ORDER — SUGAMMADEX SODIUM 500 MG/5ML IV SOLN
INTRAVENOUS | Status: AC
  Filled 2019-08-05: qty 5

## 2019-08-05 MED ORDER — CEFAZOLIN SODIUM-DEXTROSE 2-4 GM/100ML-% IV SOLN: 2.0000 g | INTRAVENOUS | Status: DC

## 2019-08-05 MED ORDER — FLUOXETINE HCL 10 MG PO CAPS
10.0000 mg | ORAL_CAPSULE | Freq: Every day | ORAL | Status: DC
  Filled 2019-08-05: qty 1

## 2019-08-05 MED ORDER — FENTANYL CITRATE (PF) 250 MCG/5ML IJ SOLN
INTRAMUSCULAR | Status: AC
  Filled 2019-08-05: qty 5

## 2019-08-05 MED ORDER — SODIUM CHLORIDE (PF) 0.9 % IJ SOLN
INTRAMUSCULAR | Status: DC | PRN
  Administered 2019-08-05: 20 mL

## 2019-08-05 MED ORDER — GLYCOPYRROLATE 0.2 MG/ML IJ SOLN
INTRAMUSCULAR | Status: DC | PRN
  Administered 2019-08-05: .2 mg via INTRAVENOUS

## 2019-08-05 MED ORDER — LACTATED RINGERS IV SOLN: INTRAVENOUS | Status: DC | PRN

## 2019-08-05 MED ORDER — LIDOCAINE 20MG/ML (2%) 15 ML SYRINGE OPTIME
INTRAMUSCULAR | Status: DC | PRN
Start: 2019-08-05 — End: 2019-08-05
  Administered 2019-08-05: 1.5 mg/kg/h via INTRAVENOUS

## 2019-08-05 MED ORDER — DIPHENHYDRAMINE HCL 50 MG/ML IJ SOLN
INTRAMUSCULAR | Status: AC
  Filled 2019-08-05: qty 1

## 2019-08-05 MED ORDER — SUGAMMADEX SODIUM 500 MG/5ML IV SOLN
INTRAVENOUS | Status: DC | PRN
  Administered 2019-08-05: 300 mg via INTRAVENOUS

## 2019-08-05 MED ORDER — CHLORHEXIDINE GLUCONATE 0.12 % MT SOLN
15.0000 mL | Freq: Once | OROMUCOSAL | Status: AC
  Administered 2019-08-05: 15 mL via OROMUCOSAL

## 2019-08-05 MED ORDER — LIDOCAINE HCL 2 % IJ SOLN
INTRAMUSCULAR | Status: AC
  Filled 2019-08-05: qty 20

## 2019-08-05 MED ORDER — MIDAZOLAM HCL 5 MG/5ML IJ SOLN
INTRAMUSCULAR | Status: DC | PRN
  Administered 2019-08-05: 2 mg via INTRAVENOUS

## 2019-08-05 MED ORDER — BUPIVACAINE LIPOSOME 1.3 % IJ SUSP
20.0000 mL | Freq: Once | INTRAMUSCULAR | Status: AC
  Administered 2019-08-05: 20 mL
  Filled 2019-08-05: qty 20

## 2019-08-05 MED ORDER — LAMOTRIGINE 25 MG PO TABS
75.0000 mg | ORAL_TABLET | Freq: Two times a day (BID) | ORAL | Status: DC
  Administered 2019-08-05 – 2019-08-08 (×6): 75 mg via ORAL
  Filled 2019-08-05 (×6): qty 3

## 2019-08-05 MED ORDER — PROPOFOL 10 MG/ML IV BOLUS
INTRAVENOUS | Status: DC | PRN
  Administered 2019-08-05: 100 mg via INTRAVENOUS
  Administered 2019-08-05: 60 mg via INTRAVENOUS

## 2019-08-05 SURGICAL SUPPLY — 70 items
ADH SKN CLS APL DERMABOND .7 (GAUZE/BANDAGES/DRESSINGS) ×1
APPLICATOR SURGIFLO ENDO (HEMOSTASIS) ×3 IMPLANT
CHLORAPREP W/TINT 26 (MISCELLANEOUS) ×3 IMPLANT
CLIP SUT LAPRA TY ABSORB (SUTURE) ×6 IMPLANT
CLIP VESOLOCK LG 6/CT PURPLE (CLIP) ×3 IMPLANT
CLIP VESOLOCK MED LG 6/CT (CLIP) ×9 IMPLANT
CLIP VESOLOCK XL 6/CT (CLIP) IMPLANT
COVER SURGICAL LIGHT HANDLE (MISCELLANEOUS) ×3 IMPLANT
COVER TIP SHEARS 8 DVNC (MISCELLANEOUS) ×1 IMPLANT
COVER TIP SHEARS 8MM DA VINCI (MISCELLANEOUS) ×3
COVER WAND RF STERILE (DRAPES) IMPLANT
DECANTER SPIKE VIAL GLASS SM (MISCELLANEOUS) ×3 IMPLANT
DERMABOND ADVANCED (GAUZE/BANDAGES/DRESSINGS) ×2
DERMABOND ADVANCED .7 DNX12 (GAUZE/BANDAGES/DRESSINGS) ×1 IMPLANT
DRAIN CHANNEL 15F RND FF 3/16 (WOUND CARE) ×3 IMPLANT
DRAPE ARM DVNC X/XI (DISPOSABLE) ×4 IMPLANT
DRAPE COLUMN DVNC XI (DISPOSABLE) ×1 IMPLANT
DRAPE DA VINCI XI ARM (DISPOSABLE) ×12
DRAPE DA VINCI XI COLUMN (DISPOSABLE) ×3
DRAPE INCISE IOBAN 66X45 STRL (DRAPES) ×3 IMPLANT
DRAPE SHEET LG 3/4 BI-LAMINATE (DRAPES) ×3 IMPLANT
ELECT PENCIL ROCKER SW 15FT (MISCELLANEOUS) ×3 IMPLANT
ELECT REM PT RETURN 15FT ADLT (MISCELLANEOUS) ×3 IMPLANT
EVACUATOR SILICONE 100CC (DRAIN) ×3 IMPLANT
GLOVE BIO SURGEON STRL SZ 6.5 (GLOVE) ×2 IMPLANT
GLOVE BIO SURGEONS STRL SZ 6.5 (GLOVE) ×1
GLOVE BIOGEL M STRL SZ7.5 (GLOVE) ×6 IMPLANT
GOWN STRL REUS W/TWL LRG LVL3 (GOWN DISPOSABLE) ×12 IMPLANT
HEMOSTAT SURGICEL 4X8 (HEMOSTASIS) ×6 IMPLANT
IRRIG SUCT STRYKERFLOW 2 WTIP (MISCELLANEOUS) ×3
IRRIGATION SUCT STRKRFLW 2 WTP (MISCELLANEOUS) ×1 IMPLANT
KIT BASIN (CUSTOM PROCEDURE TRAY) ×3 IMPLANT
KIT TURNOVER KIT A (KITS) IMPLANT
LOOP VESSEL MAXI BLUE (MISCELLANEOUS) ×3 IMPLANT
MARKER SKIN DUAL TIP RULER LAB (MISCELLANEOUS) ×3 IMPLANT
NEEDLE INSUFFLATION 14GA 120MM (NEEDLE) ×3 IMPLANT
NS IRRIG 1000ML POUR BTL (IV SOLUTION) ×3 IMPLANT
PENCIL SMOKE EVACUATOR (MISCELLANEOUS) IMPLANT
PORT ACCESS TROCAR AIRSEAL 12 (TROCAR) ×1 IMPLANT
PORT ACCESS TROCAR AIRSEAL 5M (TROCAR) ×2
POUCH SPECIMEN RETRIEVAL 10MM (ENDOMECHANICALS) ×3 IMPLANT
PROTECTOR NERVE ULNAR (MISCELLANEOUS) ×6 IMPLANT
RELOAD STAPLER WHITE 60MM (STAPLE) IMPLANT
SEAL CANN UNIV 5-8 DVNC XI (MISCELLANEOUS) ×4 IMPLANT
SEAL XI 5MM-8MM UNIVERSAL (MISCELLANEOUS) ×12
SET TRI-LUMEN FLTR TB AIRSEAL (TUBING) ×3 IMPLANT
SOLUTION ELECTROLUBE (MISCELLANEOUS) ×3 IMPLANT
SPONGE LAP 4X18 RFD (DISPOSABLE) ×3 IMPLANT
STAPLE ECHEON FLEX 60 POW ENDO (STAPLE) IMPLANT
STAPLER RELOAD WHITE 60MM (STAPLE)
SURGIFLO W/THROMBIN 8M KIT (HEMOSTASIS) ×3 IMPLANT
SUT ETHILON 3 0 PS 1 (SUTURE) ×3 IMPLANT
SUT MNCRL AB 4-0 PS2 18 (SUTURE) ×6 IMPLANT
SUT PDS AB 1 CT1 27 (SUTURE) ×6 IMPLANT
SUT V-LOC BARB 180 2/0GR6 GS22 (SUTURE) ×3
SUT VIC AB 0 CT1 27 (SUTURE) ×15
SUT VIC AB 0 CT1 27XBRD ANTBC (SUTURE) ×5 IMPLANT
SUT VIC AB 2-0 SH 27 (SUTURE) ×6
SUT VIC AB 2-0 SH 27X BRD (SUTURE) ×2 IMPLANT
SUT VICRYL 0 UR6 27IN ABS (SUTURE) ×3 IMPLANT
SUT VLOC BARB 180 ABS3/0GR12 (SUTURE) ×6
SUTURE V-LC BRB 180 2/0GR6GS22 (SUTURE) ×1 IMPLANT
SUTURE VLOC BRB 180 ABS3/0GR12 (SUTURE) ×2 IMPLANT
TOWEL OR 17X26 10 PK STRL BLUE (TOWEL DISPOSABLE) ×3 IMPLANT
TOWEL OR NON WOVEN STRL DISP B (DISPOSABLE) ×3 IMPLANT
TRAY FOLEY MTR SLVR 16FR STAT (SET/KITS/TRAYS/PACK) ×3 IMPLANT
TRAY LAPAROSCOPIC (CUSTOM PROCEDURE TRAY) ×3 IMPLANT
TROCAR BLADELESS OPT 5 100 (ENDOMECHANICALS) ×3 IMPLANT
TROCAR XCEL 12X100 BLDLESS (ENDOMECHANICALS) ×3 IMPLANT
WATER STERILE IRR 1000ML POUR (IV SOLUTION) ×6 IMPLANT

## 2019-08-05 NOTE — Brief Op Note (Signed)
08/05/2019  12:55 PM  PATIENT:  [name]  64 y.o. female  PRE-OPERATIVE DIAGNOSIS:  RIGHT RENAL MASS  POST-OPERATIVE DIAGNOSIS:  RIGHT RENAL MASS  PROCEDURE:  Procedure(s) with comments: XI ROBOTIC ASSITED PARTIAL NEPHRECTOMY WITH INTRAOPERATIVE ULTRASOUND (Right) - 3 HRS  SURGEON:  Surgeon(s) and Role:    [name], MD - Primary  PHYSICIAN ASSISTANT:   ASSISTANTS: Debbrah Alar PA   ANESTHESIA:   local and general  EBL:  150 mL   BLOOD ADMINISTERED:none  DRAINS: 1 - foley to gravity; 2 - JP to bulb   LOCAL MEDICATIONS USED:  MARCAINE     SPECIMEN:  Source of Specimen:  1 - upper cyst wall; 2 - medial cyst wall; 3 - inferior cyst wall; 4 - right renal mass; 5- fat around right renal mass; 6 - deep margin  DISPOSITION OF SPECIMEN:  PATHOLOGY  COUNTS:  YES  TOURNIQUET:  * No tourniquets in log *  DICTATION: .Other Dictation: Dictation Number  [id number]  PLAN OF CARE: Admit to inpatient   PATIENT DISPOSITION:  PACU - hemodynamically stable.   Delay start of Pharmacological VTE agent (>24hrs) due to surgical blood loss or risk of bleeding: yes

## 2019-08-05 NOTE — H&P (Signed)
[name] is an 64 y.o. female.    Chief Complaint: Pre-Op RIGHT robotic partial nephrectomy  HPI:   1 - Right Renal Mass - 3.7cm Rt mid mass by CT and MRI 2021. Mass in 90% endophytic, abuts but no overtly invade sinus fat. 1 artery (early branch, one follow each vein), 2 vein right renovascular anatomy.   2- Bilateral Minimally Complex Renal Cysts - 2cm Rt upper, 5cm Rt upper, 2cm Rt lower (some subtle wall thickening) and 2cm left upper + 2cm left lower cysts on CT, MR 2021. No mass effect.   3- Stage 3b Renal Insuficiency - CR 1.5-2 with GFR low 30s x many. NO hydro.   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 Her PCP is Tula Nakayama MD in [place].   Today " [name]" is seen to proceed with RIGHT partial nephrectomy for Rt mass in setting of renal insuficiency. Hgb 11.8, Cr 1.6, C19 screen negative, no interval fevers.   Past Medical History:  Diagnosis Date  . Depression   . Fractures   . Hyperlipidemia   . Hypertension   . Mental retardation, mild (I.Q. 50-70)    lives alone and cares for self has a nurse aide comes once a day   . Obesity   . Pneumonia   . Seizure ([place])    11/17/2012 "still having them; "I believe their from a tumor in my head" ((11/17/2012  . Seizure disorder ([place])   . Thyroid cancer [facility]     Past Surgical History:  Procedure Laterality Date  . ABDOMINAL HYSTERECTOMY  2001  . BREAST LUMPECTOMY  1996   right   . COLONOSCOPY N/A 05/16/2016   Procedure: COLONOSCOPY;  Surgeon: Danie Binder, MD;  [facility];  Service: Endoscopy;  Laterality: N/A;  830   . THYROIDECTOMY Bilateral 11/17/2012   Procedure: TOTAL THYROIDECTOMY;  Surgeon: Ascencion Dike, MD;  Location: [place];  Service: ENT;  Laterality: Bilateral;  . TOTAL THYROIDECTOMY Bilateral 11/17/2012  . TUBAL LIGATION      Family History   Problem Relation Age of Onset  . Diabetes Mother    Social History:  reports that she has never smoked. She has never used smokeless tobacco. She reports that she does not drink alcohol and does not use drugs.  Allergies:  Allergies  Allergen Reactions  . Simvastatin Other (See Comments)    Muscle aches    No medications prior to admission.    No results found for this or any previous visit (from the past 48 hour(s)). No results found.  Review of Systems  Constitutional: Negative for chills and fever.  All other systems reviewed and are negative.   There were no vitals taken for this visit. Physical Exam  HENT:  Head: Normocephalic.  Nose: Nose normal.  Eyes: Pupils are equal, round, and reactive to light.  Cardiovascular: Normal rate.  Respiratory: Effort normal.  GI:  Stable obesity  Musculoskeletal:        General: Normal range of motion.     Cervical back: Normal range of motion.  Neurological: She is alert.  Stable stigmata of mild delay, AOx 3.   Skin: Skin is warm. Capillary refill takes less than 2 seconds.  Psychiatric: Mood normal.     Assessment/Plan  Proceed as planned with RIGHT robotic partial v. Radical nephrectomy. Risks, benefits, alternatives, expected  peri-op course discussed previously and reiterated today.   [name], MD 08/05/2019, 7:20 AM

## 2019-08-05 NOTE — Transfer of Care (Signed)
Immediate Anesthesia Transfer of Care Note  Patient: [name]  Procedure(s) Performed: XI ROBOTIC ASSITED PARTIAL NEPHRECTOMY WITH INTRAOPERATIVE ULTRASOUND (Right )  Patient Location: PACU  Anesthesia Type:General  Level of Consciousness: drowsy, patient cooperative and responds to stimulation  Airway & Oxygen Therapy: Patient Spontanous Breathing and Patient connected to face mask oxygen  Post-op Assessment: Report given to RN and Post -op Vital signs reviewed and stable  Post vital signs: Reviewed and stable  Last Vitals:  Vitals Value Taken Time  BP 150/75 08/05/19 1301  Temp    Pulse 72 08/05/19 1304  Resp    SpO2 100 % 08/05/19 1304  Vitals shown include unvalidated device data.  Last Pain:  Vitals:   08/05/19 0920  TempSrc: Oral  PainSc:          Complications: No complications documented.

## 2019-08-05 NOTE — Discharge Instructions (Signed)

## 2019-08-05 NOTE — Op Note (Signed)
NAME: [name], [name] MEDICAL RECORD NI:[id number] ACCOUNT [id number] DATE OF BIRTH:1955/08/09 FACILITY: [facility] LOCATION: [id number] David Stall, MD  OPERATIVE REPORT  DATE OF PROCEDURE:  08/05/2019  SURGEON:  [name], MD  PREOPERATIVE DIAGNOSES:   1.  Right renal mass.   2.  Multiple right renal cysts.  PROCEDURES: 1.  Robotic-assisted laparoscopic right partial nephrectomy. 2.  Laparoscopic right cyst cortication. 3.  Intraoperative ultrasound interpretation.  ESTIMATED BLOOD LOSS:  150 mL.  COMPLICATIONS:  None.  SPECIMENS: 1.  Right upper cyst wall. 2.  Right lower cyst wall. 3.  Right medial cyst wall. 4.  Right renal mass. 5.  Deep margin right renal mass. 6.  Fat overlying right renal mass, all for pathology.  WARM ISCHEMIA TIME:  24 minutes.  ASSISTANT:  Amanda L. Dancy, PA  FINDINGS: 1.  A very early branching artery, 2 vein right renovascular anatomy as anticipated. 2.  Large upper pole renal cyst, noncomplex, small lower and mid renal cysts, noncomplex.  All cysts were decorticated and ablated. 3.  Approximately 50% endophytic solid right renal mass.  INDICATIONS:  The patient is a pleasant 64 year old lady who was found incidentally to have a right renal mass concerning for localized renal cell carcinoma on axial abdominal imaging, dedicated MRI corroborating  She also has some ipsilateral renal  cysts and somewhat complex renal vascular anatomy.  She has some baseline renal insufficiency with creatinines running in the 2 range.  Options discussed for management including surveillance protocols versus ablative therapy versus surgical extirpation  and she wished to proceed with nephron-sparing right partial nephrectomy with curative intent.  Informed consent was obtained and placed in the medical record.  DESCRIPTION OF PROCEDURE:  The patient being identified, the procedure being right robotic partial nephrectomy and cyst decortication was  confirmed.  Procedure timeout was performed.  Intravenous antibiotics were administered.  General endotracheal  anesthesia induced.  The patient was placed into the right side up full flank position, pulling 15 degrees of table flexion, superior arm elevator, axillary roll sequential devices, bottom leg bent, top leg straight.  She was further fastened to the  operative table using 3-inch tape over foam padding across the supraxiphoid chest and her pelvis.  Beanbag was also used.  Her superior arm was elevated with arm elevator, carefully padded.  Foley catheter was placed free to straight drain.  Sterile  field was created, prepping and draping the patient's entire right flank and abdomen using chlorhexidine gluconate and a high-flow, low-pressure pneumoperitoneum was obtained using Veress technique, right lower quadrant, having passed the aspiration and  drop test.  Next, an 8 mm robotic camera port was placed in position approximately 4 fingerbreadths inferior to the costal margin.  Laparoscopic examination of peritoneal cavity revealed no significant adhesions, no visceral injury.  Distal ports were  placed as follows:  Subcostal 5 mm liver retraction port, 12 mm midline AirSeal assist port approximately 2 fingerbreadths above the planned camera port, 12 mm assist port approximately 2 fingerbreadths below the planned camera port in the midline, right  subcostal 8 mm robotic port, right far lateral 8 mm robotic port approximately 4 fingerbreadths superior and medial to the anterior iliac spine and a right paramedian inferior robotic port approximately 1 handbreadth superior to the pubic ramus.  Robot  was docked and passed the electronic checks.  Attention was directed at development of the retroperitoneum.  Incision was made lateral to the ascending colon from the cecum towards the area of the hepatic flexure.  The colon was carefully swept medially.   This exposed the anterior surface of Gerota fascia,  as well as the duodenum, which was carefully kocherized such that it lay medial to the lateral surface of the inferior vena cava.  The lower pole kidney area was identified and placed on gentle  lateral traction.  Dissection proceeded medial to this.  Ureter was encountered and placed on gentle lateral traction.  The gonadal vessels were allowed to fall medially.  Dissection proceeded within this triangle towards the area of the renal hilum.   Renal hilum was somewhat complex as anticipated with 2-vein very early branching renal renovascular anatomy.  The tubings were separately looped with vessel loops and given the location of the common artery trunk, which was well behind the inferior vena  cava, decision was made to treat them as 2 separate arteries and the upper branch and lower branch were also independently marked with vessel loops.  Attention was directed to identification of the renal mass.  As expected, the patient had a dominant  upper pole cyst.  This was noncomplex.  It was decorticated with robotic scissors with wall set aside for permanent pathology and the base fulgurated.  Additionally, there was a small medial mid cyst and a lower pole cyst which were also decorticated,  all set aside for separate permanent pathology and the base fulgurated.  This allowed much easier appreciation of the renal mass as it lie just inferior to the inferior border of the large upper pole cyst.  Intraoperative ultrasound was then performed.   Intraoperative ultrasound revealed a solid right renal mass, approximately 50% endophytic.  Using a combination of gross visual cues and endoscopic ultrasound, the presumed margins of the partial nephrectomy were noted with scissor cautery.  Warm  ischemia was then achieved using 2 bulldog clamps on the superior artery, which was a dominant artery, 1 bulldog clamp on the inferior and partial nephrectomy was performed using cold scissors, keeping what appeared to be a rim of  normal parenchymal  tissue with the tumor area, as well as a large rim of fat.  These were set aside for permanent pathology.  There was 1 small entrance into the left collecting system as well as several small venous sinus entrances as anticipated.  First layer renorrhaphy  was performed using a running 3-0 V-Loc suture, over-sewing the collecting system entrance, as well as small venous sinuses and a second V-Loc used in a similar fashion.  Warm ischemia was then stopped by removing the bulldog clamps for a total warm  ischemia time of 24 minutes and a second layer renorrhaphy of a Surgicel bolster, followed by 4 parenchymal apposition sutures of 0 Vicryl sandwiched between Hem-o-loks and Lapra-Tys were then applied, which resulted in excellent hemostasis and  parenchymal apposition.  Gerota's was reapproximated using running 2-0 V-Loc.  The vessel loops were removed.  Sponge and needle counts were correct.  The peritumor fat and right renal mass specimens were placed in the EndoCatch bag.  A closed suction  drain was brought out the previous lateral most robotic port site near the peritoneal cavity.  Robot was then undocked.  The previous 12 mm superior assistant port site was closed at the level of the fascia using a Carter-Thomason suture passer and 0  Vicryl.  The inferior assistant port site was extended for a distance of approximately 2.5 cm and the partial nephrectomy specimen and perirenal fat specimens were removed, set aside for permanent pathology.  The extraction  site was closed at the level  of the fascia using figure-of-eight PDS x3, followed by reapproximation of Scarpa's with a running Vicryl.  All incision sites were infiltrated with dilute lipolyzed Marcaine and closed at the level of the skin using subcuticular Monocryl, followed by  Dermabond.  The procedure was then terminated.  The patient tolerated the procedure well.  No immediate perioperative complications.  The patient was taken  to the postanesthesia care unit in stable condition.  Please note, first assistant  Debbrah Alar was crucial for all portions of the surgery today.  She provided invaluable vascular clamping, vascular looping, specimen manipulation, suture passage,  retraction and general first assistance.  VN/NUANCE  D:08/05/2019 T:08/05/2019 JOB:[id number]

## 2019-08-05 NOTE — Anesthesia Procedure Notes (Signed)
Procedure Name: Intubation Date/Time: 08/05/2019 10:17 AM Performed by: Glory Buff, CRNA Pre-anesthesia Checklist: Patient identified, Emergency Drugs available, Suction available and Patient being monitored Patient Re-evaluated:Patient Re-evaluated prior to induction Oxygen Delivery Method: Circle system utilized Preoxygenation: Pre-oxygenation with 100% oxygen Induction Type: IV induction Ventilation: Mask ventilation without difficulty Laryngoscope Size: Miller and 3 Grade View: Grade I Tube type: Oral Tube size: 7.0 mm Number of attempts: 1 Airway Equipment and Method: Stylet and Oral airway Placement Confirmation: ETT inserted through vocal cords under direct vision,  positive ETCO2 and breath sounds checked- equal and bilateral Secured at: 21 cm Tube secured with: Tape Dental Injury: Teeth and Oropharynx as per pre-operative assessment

## 2019-08-05 NOTE — Anesthesia Postprocedure Evaluation (Signed)
Anesthesia Post Note  Patient: CANNA NICKELSON  Procedure(s) Performed: XI ROBOTIC ASSITED PARTIAL NEPHRECTOMY WITH INTRAOPERATIVE ULTRASOUND (Right )     Patient location during evaluation: PACU Anesthesia Type: General Level of consciousness: awake and alert Pain management: pain level controlled Vital Signs Assessment: post-procedure vital signs reviewed and stable Respiratory status: spontaneous breathing, nonlabored ventilation and respiratory function stable Cardiovascular status: blood pressure returned to baseline and stable Postop Assessment: no apparent nausea or vomiting Anesthetic complications: no   No complications documented.  Last Vitals:  Vitals:   08/05/19 1330 08/05/19 1345  BP: 133/70 125/73  Pulse: 65 64  Resp: 14 12  Temp:    SpO2: 100% 100%    Last Pain:  Vitals:   08/05/19 1345  TempSrc:   PainSc: Asleep                 Catalina Gravel

## 2019-08-05 NOTE — Anesthesia Preprocedure Evaluation (Addendum)
Anesthesia Evaluation  Patient identified by MRN, date of birth, ID band Patient awake    Reviewed: Allergy & Precautions, NPO status , Patient's Chart, lab work & pertinent test results  Airway Mallampati: II  TM Distance: >3 FB Neck ROM: Full    Dental  (+) Dental Advisory Given, Poor Dentition, Missing, Chipped,    Pulmonary neg pulmonary ROS,    Pulmonary exam normal breath sounds clear to auscultation       Cardiovascular hypertension, Pt. on medications Normal cardiovascular exam Rhythm:Regular Rate:Normal     Neuro/Psych Seizures -, Well Controlled,  PSYCHIATRIC DISORDERS Depression    GI/Hepatic Neg liver ROS, GERD  Medicated,  Endo/Other  Hypothyroidism Morbid obesity  Renal/GU Renal InsufficiencyRenal diseaseRIGHT RENAL MASS     Musculoskeletal  (+) Arthritis ,   Abdominal   Peds  Hematology negative hematology ROS (+)   Anesthesia Other Findings Day of surgery medications reviewed with the patient.  Reproductive/Obstetrics                            Anesthesia Physical Anesthesia Plan  ASA: III  Anesthesia Plan: General   Post-op Pain Management:    Induction: Intravenous  PONV Risk Score and Plan: 4 or greater and Diphenhydramine, Scopolamine patch - Pre-op, Midazolam, Dexamethasone and Ondansetron  Airway Management Planned: Oral ETT  Additional Equipment:   Intra-op Plan:   Post-operative Plan: Extubation in OR  Informed Consent: I have reviewed the patients History and Physical, chart, labs and discussed the procedure including the risks, benefits and alternatives for the proposed anesthesia with the patient or authorized representative who has indicated his/her understanding and acceptance.     Dental advisory given  Plan Discussed with: CRNA  Anesthesia Plan Comments: (2nd PIV )        Anesthesia Quick Evaluation

## 2019-08-06 ENCOUNTER — Encounter (HOSPITAL_COMMUNITY): Payer: Self-pay | Admitting: Urology

## 2019-08-06 DIAGNOSIS — Z85850 Personal history of malignant neoplasm of thyroid: Secondary | ICD-10-CM | POA: Diagnosis not present

## 2019-08-06 DIAGNOSIS — N2889 Other specified disorders of kidney and ureter: Secondary | ICD-10-CM | POA: Diagnosis present

## 2019-08-06 DIAGNOSIS — Z888 Allergy status to other drugs, medicaments and biological substances status: Secondary | ICD-10-CM | POA: Diagnosis not present

## 2019-08-06 DIAGNOSIS — G40909 Epilepsy, unspecified, not intractable, without status epilepticus: Secondary | ICD-10-CM | POA: Diagnosis not present

## 2019-08-06 DIAGNOSIS — C641 Malignant neoplasm of right kidney, except renal pelvis: Secondary | ICD-10-CM | POA: Diagnosis not present

## 2019-08-06 DIAGNOSIS — Z833 Family history of diabetes mellitus: Secondary | ICD-10-CM | POA: Diagnosis not present

## 2019-08-06 DIAGNOSIS — N281 Cyst of kidney, acquired: Secondary | ICD-10-CM | POA: Diagnosis not present

## 2019-08-06 DIAGNOSIS — F329 Major depressive disorder, single episode, unspecified: Secondary | ICD-10-CM | POA: Diagnosis not present

## 2019-08-06 DIAGNOSIS — Z6841 Body Mass Index (BMI) 40.0 and over, adult: Secondary | ICD-10-CM | POA: Diagnosis not present

## 2019-08-06 DIAGNOSIS — I10 Essential (primary) hypertension: Secondary | ICD-10-CM | POA: Diagnosis not present

## 2019-08-06 DIAGNOSIS — K219 Gastro-esophageal reflux disease without esophagitis: Secondary | ICD-10-CM | POA: Diagnosis not present

## 2019-08-06 DIAGNOSIS — E890 Postprocedural hypothyroidism: Secondary | ICD-10-CM | POA: Diagnosis not present

## 2019-08-06 DIAGNOSIS — E785 Hyperlipidemia, unspecified: Secondary | ICD-10-CM | POA: Diagnosis not present

## 2019-08-06 DIAGNOSIS — M1990 Unspecified osteoarthritis, unspecified site: Secondary | ICD-10-CM | POA: Diagnosis not present

## 2019-08-06 DIAGNOSIS — R6250 Unspecified lack of expected normal physiological development in childhood: Secondary | ICD-10-CM | POA: Diagnosis not present

## 2019-08-06 DIAGNOSIS — Z20822 Contact with and (suspected) exposure to covid-19: Secondary | ICD-10-CM | POA: Diagnosis not present

## 2019-08-06 LAB — BASIC METABOLIC PANEL
Anion gap: 7 (ref 5–15)
BUN: 20 mg/dL (ref 8–23)
CO2: 23 mmol/L (ref 22–32)
Calcium: 9.3 mg/dL (ref 8.9–10.3)
Chloride: 104 mmol/L (ref 98–111)
Creatinine, Ser: 1.6 mg/dL — ABNORMAL HIGH (ref 0.44–1.00)
GFR calc Af Amer: 39 mL/min — ABNORMAL LOW (ref >60)
GFR calc non Af Amer: 33 mL/min — ABNORMAL LOW (ref >60)
Glucose, Bld: 146 mg/dL — ABNORMAL HIGH (ref 70–99)
Potassium: 4.4 mmol/L (ref 3.5–5.1)
Sodium: 134 mmol/L — ABNORMAL LOW (ref 135–145)

## 2019-08-06 LAB — HEMOGLOBIN AND HEMATOCRIT, BLOOD
HCT: 30.6 % — ABNORMAL LOW (ref 36.0–46.0)
Hemoglobin: 10.1 g/dL — ABNORMAL LOW (ref 12.0–15.0)

## 2019-08-06 NOTE — Progress Notes (Signed)
1 Day Post-Op Subjective: Patient reports some abd pain on rt side. No flatus/BM yet  Objective: Vital signs in last 24 hours: Temp:  [97.4 F (36.3 C)-99.1 F (37.3 C)] 98.6 F (37 C) (06/12 0812) Pulse Rate:  [58-78] 78 (06/12 0812) Resp:  [12-21] 18 (06/12 0812) BP: (109-153)/(52-84) 127/69 (06/12 0812) SpO2:  [91 %-100 %] 91 % (06/12 0812) Weight:  [113.6 kg] 113.6 kg (06/11 1526)  Intake/Output from previous day: 06/11 0701 - 06/12 0700 In: 3618.3 [I.V.:3518.3; IV Piggyback:100] Out: 1465 [Urine:1225; Drains:90; Blood:150] Intake/Output this shift: No intake/output data recorded.  Physical Exam:  Constitutional: Vital signs reviewed. WD WN in NAD   Eyes: PERRL, No scleral icterus.   Cardiovascular: RRR Pulmonary/Chest: Normal effort Abdominal: Soft. Minimal tympany. Incisions C/D/I Extremities: No cyanosis or edema   Lab Results: Recent Labs    08/05/19 1309 08/06/19 0657  HGB 11.8* 10.1*  HCT 37.0 30.6*   BMET Recent Labs    08/06/19 0657  NA 134*  K 4.4  CL 104  CO2 23  GLUCOSE 146*  BUN 20  CREATININE 1.60*  CALCIUM 9.3   No results for input(s): LABPT, INR in the last 72 hours. No results for input(s): LABURIN in the last 72 hours. Results for orders placed or performed during the hospital encounter of 08/02/19  SARS CORONAVIRUS 2 (TAT 6-24 HRS) Nasopharyngeal Nasopharyngeal Swab     Status: None   Collection Time: 08/02/19  9:22 AM   Specimen: Nasopharyngeal Swab  Result Value Ref Range Status   SARS Coronavirus 2 NEGATIVE NEGATIVE Final    Comment: (NOTE) SARS-CoV-2 target nucleic acids are NOT DETECTED. The SARS-CoV-2 RNA is generally detectable in upper and lower respiratory specimens during the acute phase of infection. Negative results do not preclude SARS-CoV-2 infection, do not rule out co-infections with other pathogens, and should not be used as the sole basis for treatment or other patient management decisions. Negative results  must be combined with clinical observations, patient history, and epidemiological information. The expected result is Negative. Fact Sheet for Patients: SugarRoll.be Fact Sheet for Healthcare Providers: https://www.woods-mathews.com/ This test is not yet approved or cleared by the [place] FDA and  has been authorized for detection and/or diagnosis of SARS-CoV-2 by FDA under an Emergency Use Authorization (EUA). This EUA will remain  in effect (meaning this test can be used) for the duration of the COVID-19 declaration under Section 56 4(b)(1) of the Act, 21 U.S.C. section 360bbb-3(b)(1), unless the authorization is terminated or revoked sooner. Performed at [facility], [place] [address], [place], [place] [address]     Studies/Results: No results found.  Assessment/Plan:   Stable POD 1 Rt partial nephrectomy  Will ambulate, d/c foley  Advance diet as tolerated   LOS: 0 days   [name] 08/06/2019, 11:08 AM

## 2019-08-06 NOTE — Progress Notes (Signed)
Pt oxygen saturation desat down to low 80s on room air while ambulating and went back up to 90-93% at rest.

## 2019-08-07 DIAGNOSIS — G40909 Epilepsy, unspecified, not intractable, without status epilepticus: Secondary | ICD-10-CM | POA: Diagnosis not present

## 2019-08-07 DIAGNOSIS — Z6841 Body Mass Index (BMI) 40.0 and over, adult: Secondary | ICD-10-CM | POA: Diagnosis not present

## 2019-08-07 DIAGNOSIS — Z20822 Contact with and (suspected) exposure to covid-19: Secondary | ICD-10-CM | POA: Diagnosis not present

## 2019-08-07 DIAGNOSIS — C641 Malignant neoplasm of right kidney, except renal pelvis: Secondary | ICD-10-CM | POA: Diagnosis not present

## 2019-08-07 DIAGNOSIS — N2889 Other specified disorders of kidney and ureter: Secondary | ICD-10-CM | POA: Diagnosis not present

## 2019-08-07 LAB — BASIC METABOLIC PANEL
Anion gap: 9 (ref 5–15)
BUN: 16 mg/dL (ref 8–23)
CO2: 23 mmol/L (ref 22–32)
Calcium: 9.1 mg/dL (ref 8.9–10.3)
Chloride: 104 mmol/L (ref 98–111)
Creatinine, Ser: 1.75 mg/dL — ABNORMAL HIGH (ref 0.44–1.00)
GFR calc Af Amer: 35 mL/min — ABNORMAL LOW (ref >60)
GFR calc non Af Amer: 30 mL/min — ABNORMAL LOW (ref >60)
Glucose, Bld: 115 mg/dL — ABNORMAL HIGH (ref 70–99)
Potassium: 4.5 mmol/L (ref 3.5–5.1)
Sodium: 136 mmol/L (ref 135–145)

## 2019-08-07 LAB — GLUCOSE, CAPILLARY: Glucose-Capillary: 109 mg/dL — ABNORMAL HIGH (ref 70–99)

## 2019-08-07 MED ORDER — LORAZEPAM 2 MG/ML IJ SOLN: 0.5000 mg | Freq: Four times a day (QID) | INTRAMUSCULAR | Status: DC

## 2019-08-07 MED ORDER — BISACODYL 10 MG RE SUPP
10.0000 mg | Freq: Every day | RECTAL | Status: DC | PRN
  Administered 2019-08-07 – 2019-08-08 (×2): 10 mg via RECTAL
  Filled 2019-08-07 (×2): qty 1

## 2019-08-07 MED ORDER — LORAZEPAM 2 MG/ML IJ SOLN: 0.5000 mg | Freq: Four times a day (QID) | INTRAMUSCULAR | Status: DC | PRN

## 2019-08-07 NOTE — Progress Notes (Addendum)
Called d/t pt having seizure activity.  Pt found to be slow to response but answered some simple questions and follows commands.  Admitted to past seizure activity but unaware of knowing when she is about to have one.  CBG complete. EKG complete.  Pt MD made aware, new orders received and initiated.  See orders.  VS intact see flow sheet.  Pt seems very sleepy but no more seizure activity noted at this time.  Pt RN informed to call if any assistance is needed.

## 2019-08-07 NOTE — Progress Notes (Signed)
Attempted to ambulate patient, pt in pain at this time and did not want to walk. Pain meds given. Will attempt to ambulate after pain has decreased.   Pt did ambulate approximately 64ft to bathroom and then back to bed.

## 2019-08-07 NOTE — Progress Notes (Signed)
Patient called RN to room requesting for pain medication with pain scale of 10/10 stating "I'm hurting everywhere". RN administered pain medication to patient and within 30 seconds patient started convulsing and pulling on JP drainage. Patient's seizure lasted for 20-30 seconds.  Rapid Response called into room and on call Urologist notified of seizure incident. See new orders.Patient stated that  she doesn't remember the last time she had a seizure but is unaware of when she is about to have one. RN initiated seizure precaution, purewick in place, and bed at lowest position with call lights within reach. Will continue to monitor.

## 2019-08-07 NOTE — Progress Notes (Signed)
Pt reports passing gas.   JP leaking at site and saturated dressing. Dressing at [place] site changed.   Pt continues to ambulate well to and from bathroom.

## 2019-08-07 NOTE — Progress Notes (Signed)
2 Days Post-Op Subjective: She had 2 seizures last night. Currently, she is awake, alert.  Is complaining about abdominal cramping/pain.  No flatus or bowel movement yet.  Objective: Vital signs in last 24 hours: Temp:  [97.9 F (36.6 C)-99.8 F (37.7 C)] 99.4 F (37.4 C) (06/13 0300) Pulse Rate:  [71-126] 110 (06/13 0300) Resp:  [16-20] 18 (06/13 0241) BP: (134-164)/(62-108) 134/70 (06/13 0300) SpO2:  [91 %-94 %] 92 % (06/13 0241)  Intake/Output from previous day: 06/12 0701 - 06/13 0700 In: 3866 [P.O.:1440; I.V.:2426] Out: 2540 [Urine:2500; Drains:40] Intake/Output this shift: No intake/output data recorded.  Physical Exam:  Constitutional: Vital signs reviewed. WD WN in NAD   Eyes: PERRL, No scleral icterus.   Cardiovascular: RRR Pulmonary/Chest: Normal effort Abdominal: Appropriately tender.  Wounds all look fine.  She does have moderate tympany. Extremities: No cyanosis or edema   Lab Results: Recent Labs    08/05/19 1309 08/06/19 0657  HGB 11.8* 10.1*  HCT 37.0 30.6*   BMET Recent Labs    08/06/19 0657 08/07/19 0538  NA 134* 136  K 4.4 4.5  CL 104 104  CO2 23 23  GLUCOSE 146* 115*  BUN 20 16  CREATININE 1.60* 1.75*  CALCIUM 9.3 9.1   No results for input(s): LABPT, INR in the last 72 hours. No results for input(s): LABURIN in the last 72 hours. Results for orders placed or performed during the hospital encounter of 08/02/19  SARS CORONAVIRUS 2 (TAT 6-24 HRS) Nasopharyngeal Nasopharyngeal Swab     Status: None   Collection Time: 08/02/19  9:22 AM   Specimen: Nasopharyngeal Swab  Result Value Ref Range Status   SARS Coronavirus 2 NEGATIVE NEGATIVE Final    Comment: (NOTE) SARS-CoV-2 target nucleic acids are NOT DETECTED. The SARS-CoV-2 RNA is generally detectable in upper and lower respiratory specimens during the acute phase of infection. Negative results do not preclude SARS-CoV-2 infection, do not rule out co-infections with other pathogens,  and should not be used as the sole basis for treatment or other patient management decisions. Negative results must be combined with clinical observations, patient history, and epidemiological information. The expected result is Negative. Fact Sheet for Patients: SugarRoll.be Fact Sheet for Healthcare Providers: https://www.woods-mathews.com/ This test is not yet approved or cleared by the [place] FDA and  has been authorized for detection and/or diagnosis of SARS-CoV-2 by FDA under an Emergency Use Authorization (EUA). This EUA will remain  in effect (meaning this test can be used) for the duration of the COVID-19 declaration under Section 56 4(b)(1) of the Act, 21 U.S.C. section 360bbb-3(b)(1), unless the authorization is terminated or revoked sooner. Performed at [facility], [place] [address] [place], [place] [address]     Studies/Results: No results found.  Assessment/Plan:   Postoperative day #2.  She does have a history of seizure disorder and had 2 seizures last night.  Keppra level pending.  Electrolytes look fine, renal function basically normal for her.    I will give her a suppository.  If another seizure, I will have neurology see her.    Keep on a clear liquid diet for now   LOS: 1 day   Jorja Loa 08/07/2019, 8:36 AM

## 2019-08-08 LAB — SURGICAL PATHOLOGY

## 2019-08-08 NOTE — Discharge Summary (Signed)
Physician Discharge Summary  Patient ID: [name] MRN: [id number] DOB/AGE: Feb 12, 1956 64 y.o.  Admit date: 08/05/2019 Discharge date: 08/08/2019  Admission Diagnoses: Right Renal Mass  Discharge Diagnoses:  Active Problems:   Renal mass   Discharged Condition: fair  Hospital Course: Pt underwent RIGHT robotic partial nephrectomy on 6/11, the day of admission, withotu acute complication. Has baseline epilepsy and stage 3 renal dysfunction. 2 non-complex seizures POD 1 that were not recurrent with restarting home meds. BMP lytes normal, Cr 1.7, Hgb 10. By the AM of POD 3, she is ambulatory, pain controlled with PO meds, maintaining PO hydration / nutrition, and felt to be adequate for discharge. Foley remoec and voided. JP removed as output remained minimal. Final path pending at discharge. Overall GFR approx stable.   Consults: None  Significant Diagnostic Studies: labs: as per above  Treatments: surgery: as per above  Discharge Exam: Blood pressure (!) 143/67, pulse 85, temperature 99.5 F (37.5 C), temperature source Oral, resp. rate 18, height 5\' 5"  (1.651 m), weight 113.6 kg, SpO2 95 %. General appearance: alert and cooperative Eyes: negative Nose: Nares normal. Septum midline. Mucosa normal. No drainage or sinus tenderness. Throat: lips, mucosa, and tongue normal; teeth and gums normal Neck: supple, symmetrical, trachea midline Back: symmetric, no curvature. ROM normal. No CVA tenderness. Resp: non-labored on room air Cardio: Nl rate GI: soft, non-tender; bowel sounds normal; no masses,  no organomegaly and stable truncal obesity. JP removed (outptu scant and non=foul serous) and dry dressing applied. Recent surgical sites c/d/i.  Extremities: extremities normal, atraumatic, no cyanosis or edema Pulses: 2+ and symmetric Skin: Skin color, texture, turgor normal. No rashes or lesions Lymph nodes: Cervical, supraclavicular, and axillary nodes normal. Neurologic: Mental  status: stigmata of mild MR stable.   Disposition: Discharge disposition: 01-Home or Self Care        Allergies as of 08/08/2019      Reactions   Simvastatin Other (See Comments)   Muscle aches      Medication List    TAKE these medications   acetaminophen 500 MG tablet Commonly known as: TYLENOL Take 1 tablet (500 mg total) by mouth 2 (two) times daily. What changed:   when to take this  reasons to take this   amLODipine 5 MG tablet Commonly known as: NORVASC Take 1 tablet (5 mg total) by mouth daily.   benazepril 20 MG tablet Commonly known as: LOTENSIN TAKE 1 TABLET BY MOUTH DAILY.   calcitRIOL 0.25 MCG capsule Commonly known as: ROCALTROL Take 0.25 mcg by mouth daily. \   ezetimibe 10 MG tablet Commonly known as: ZETIA Take 1 tablet (10 mg total) by mouth daily.   FLUoxetine 10 MG capsule Commonly known as: PROZAC Take 1 capsule (10 mg total) by mouth daily.   LaMICtal 150 MG tablet Generic drug: lamoTRIgine Take 75 mg by mouth 2 (two) times daily.   levETIRAcetam 750 MG tablet Commonly known as: KEPPRA Two tablets twice daily What changed:   how much to take  how to take this  when to take this  additional instructions   levothyroxine 137 MCG tablet Commonly known as: SYNTHROID Take 1 tablet (137 mcg total) by mouth daily before breakfast.   loratadine 10 MG tablet Commonly known as: CLARITIN TAKE ONE TABLET BY MOUTH ONCE DAILY.   omeprazole 20 MG capsule Commonly known as: PRILOSEC TAKE 1 CAPSULE BY MOUTH ONCE DAILY.   rosuvastatin 40 MG tablet Commonly known as: CRESTOR TAKE 1 TABLET BY  MOUTH ONCE A DAY.       Follow-up Information    [name], MD.   Specialty: Urology Why: Office will call to arrange MD visit in abotu 2 weeks.  Contact information: [place] [address] [phone]               Signed: [name] 08/08/2019, 7:12 AM

## 2019-08-09 ENCOUNTER — Telehealth: Payer: Self-pay

## 2019-08-09 NOTE — Telephone Encounter (Signed)
Transition Care Management Follow-up Telephone Call   Date discharged?                How have you been since you were released from the hospital?    Do you understand why you were in the hospital?    Do you understand the discharge instructions?   Where were you discharged to?    Items Reviewed:  Medications reviewed:   Allergies reviewed:   Dietary changes reviewed:   Referrals reviewed:    Functional Questionnaire:   Activities of Daily Living (ADLs):      Any transportation issues/concerns?:    Any patient concerns?    Confirmed importance and date/time of follow-up visits scheduled      Confirmed with patient if condition begins to worsen call PCP or go to the ER.  Patient was given the office number and encouraged to call back with question or concerns.  :   

## 2019-08-10 LAB — LEVETIRACETAM LEVEL: Levetiracetam Lvl: 66.6 ug/mL — ABNORMAL HIGH (ref 10.0–40.0)

## 2019-08-10 NOTE — Telephone Encounter (Signed)
2nd Attempt TOC call NA NVM

## 2019-08-12 ENCOUNTER — Encounter (HOSPITAL_COMMUNITY)
Admission: EM | Disposition: A | Discharge status: Skilled Nursing Facility | Payer: Self-pay | Source: Home / Self Care | Attending: Urology

## 2019-08-12 ENCOUNTER — Emergency Department (HOSPITAL_COMMUNITY): Payer: Medicare Other

## 2019-08-12 ENCOUNTER — Inpatient Hospital Stay (HOSPITAL_COMMUNITY): Payer: Medicare Other | Admitting: Certified Registered Nurse Anesthetist

## 2019-08-12 ENCOUNTER — Inpatient Hospital Stay (HOSPITAL_COMMUNITY)
Admission: EM | Admit: 2019-08-12 | Discharge: 2019-08-26 | DRG: 353 | Disposition: A | Discharge status: Skilled Nursing Facility | Payer: Medicare Other | Attending: Internal Medicine | Admitting: Internal Medicine

## 2019-08-12 ENCOUNTER — Other Ambulatory Visit: Payer: Self-pay

## 2019-08-12 ENCOUNTER — Inpatient Hospital Stay (HOSPITAL_COMMUNITY): Payer: Medicare Other

## 2019-08-12 ENCOUNTER — Encounter (HOSPITAL_COMMUNITY): Payer: Self-pay

## 2019-08-12 DIAGNOSIS — D631 Anemia in chronic kidney disease: Secondary | ICD-10-CM | POA: Diagnosis present

## 2019-08-12 DIAGNOSIS — Z20822 Contact with and (suspected) exposure to covid-19: Secondary | ICD-10-CM | POA: Diagnosis present

## 2019-08-12 DIAGNOSIS — I4891 Unspecified atrial fibrillation: Secondary | ICD-10-CM | POA: Diagnosis not present

## 2019-08-12 DIAGNOSIS — Z6839 Body mass index (BMI) 39.0-39.9, adult: Secondary | ICD-10-CM

## 2019-08-12 DIAGNOSIS — E890 Postprocedural hypothyroidism: Secondary | ICD-10-CM | POA: Diagnosis present

## 2019-08-12 DIAGNOSIS — Z85850 Personal history of malignant neoplasm of thyroid: Secondary | ICD-10-CM

## 2019-08-12 DIAGNOSIS — Z87440 Personal history of urinary (tract) infections: Secondary | ICD-10-CM

## 2019-08-12 DIAGNOSIS — F70 Mild intellectual disabilities: Secondary | ICD-10-CM | POA: Diagnosis present

## 2019-08-12 DIAGNOSIS — C641 Malignant neoplasm of right kidney, except renal pelvis: Secondary | ICD-10-CM | POA: Diagnosis present

## 2019-08-12 DIAGNOSIS — I129 Hypertensive chronic kidney disease with stage 1 through stage 4 chronic kidney disease, or unspecified chronic kidney disease: Secondary | ICD-10-CM | POA: Diagnosis present

## 2019-08-12 DIAGNOSIS — R569 Unspecified convulsions: Secondary | ICD-10-CM

## 2019-08-12 DIAGNOSIS — E669 Obesity, unspecified: Secondary | ICD-10-CM | POA: Diagnosis present

## 2019-08-12 DIAGNOSIS — F78 Other intellectual disabilities: Secondary | ICD-10-CM | POA: Diagnosis not present

## 2019-08-12 DIAGNOSIS — E039 Hypothyroidism, unspecified: Secondary | ICD-10-CM

## 2019-08-12 DIAGNOSIS — Z79890 Hormone replacement therapy (postmenopausal): Secondary | ICD-10-CM

## 2019-08-12 DIAGNOSIS — K567 Ileus, unspecified: Secondary | ICD-10-CM | POA: Diagnosis not present

## 2019-08-12 DIAGNOSIS — G40909 Epilepsy, unspecified, not intractable, without status epilepticus: Secondary | ICD-10-CM | POA: Diagnosis present

## 2019-08-12 DIAGNOSIS — F329 Major depressive disorder, single episode, unspecified: Secondary | ICD-10-CM | POA: Diagnosis present

## 2019-08-12 DIAGNOSIS — E876 Hypokalemia: Secondary | ICD-10-CM | POA: Diagnosis not present

## 2019-08-12 DIAGNOSIS — N179 Acute kidney failure, unspecified: Secondary | ICD-10-CM | POA: Diagnosis not present

## 2019-08-12 DIAGNOSIS — N170 Acute kidney failure with tubular necrosis: Secondary | ICD-10-CM | POA: Diagnosis present

## 2019-08-12 DIAGNOSIS — Z833 Family history of diabetes mellitus: Secondary | ICD-10-CM

## 2019-08-12 DIAGNOSIS — I959 Hypotension, unspecified: Secondary | ICD-10-CM | POA: Diagnosis present

## 2019-08-12 DIAGNOSIS — K430 Incisional hernia with obstruction, without gangrene: Principal | ICD-10-CM | POA: Diagnosis present

## 2019-08-12 DIAGNOSIS — Z0189 Encounter for other specified special examinations: Secondary | ICD-10-CM

## 2019-08-12 DIAGNOSIS — E860 Dehydration: Secondary | ICD-10-CM | POA: Diagnosis present

## 2019-08-12 DIAGNOSIS — K56609 Unspecified intestinal obstruction, unspecified as to partial versus complete obstruction: Secondary | ICD-10-CM | POA: Diagnosis not present

## 2019-08-12 DIAGNOSIS — Z789 Other specified health status: Secondary | ICD-10-CM

## 2019-08-12 DIAGNOSIS — I350 Nonrheumatic aortic (valve) stenosis: Secondary | ICD-10-CM | POA: Diagnosis not present

## 2019-08-12 DIAGNOSIS — Z90710 Acquired absence of both cervix and uterus: Secondary | ICD-10-CM

## 2019-08-12 DIAGNOSIS — E785 Hyperlipidemia, unspecified: Secondary | ICD-10-CM | POA: Diagnosis present

## 2019-08-12 DIAGNOSIS — M7989 Other specified soft tissue disorders: Secondary | ICD-10-CM | POA: Diagnosis not present

## 2019-08-12 DIAGNOSIS — K431 Incisional hernia with gangrene: Secondary | ICD-10-CM | POA: Diagnosis not present

## 2019-08-12 DIAGNOSIS — N1832 Chronic kidney disease, stage 3b: Secondary | ICD-10-CM | POA: Diagnosis present

## 2019-08-12 DIAGNOSIS — D62 Acute posthemorrhagic anemia: Secondary | ICD-10-CM | POA: Diagnosis not present

## 2019-08-12 DIAGNOSIS — Z905 Acquired absence of kidney: Secondary | ICD-10-CM

## 2019-08-12 DIAGNOSIS — K219 Gastro-esophageal reflux disease without esophagitis: Secondary | ICD-10-CM | POA: Diagnosis present

## 2019-08-12 DIAGNOSIS — I272 Pulmonary hypertension, unspecified: Secondary | ICD-10-CM | POA: Diagnosis not present

## 2019-08-12 DIAGNOSIS — R0781 Pleurodynia: Secondary | ICD-10-CM | POA: Diagnosis not present

## 2019-08-12 DIAGNOSIS — E872 Acidosis: Secondary | ICD-10-CM | POA: Diagnosis not present

## 2019-08-12 DIAGNOSIS — I10 Essential (primary) hypertension: Secondary | ICD-10-CM | POA: Diagnosis not present

## 2019-08-12 DIAGNOSIS — Z79899 Other long term (current) drug therapy: Secondary | ICD-10-CM

## 2019-08-12 DIAGNOSIS — Z85528 Personal history of other malignant neoplasm of kidney: Secondary | ICD-10-CM

## 2019-08-12 DIAGNOSIS — I480 Paroxysmal atrial fibrillation: Secondary | ICD-10-CM | POA: Diagnosis not present

## 2019-08-12 HISTORY — DX: Nausea with vomiting, unspecified: Z98.890

## 2019-08-12 HISTORY — DX: Nausea with vomiting, unspecified: R11.2

## 2019-08-12 HISTORY — PX: INGUINAL HERNIA REPAIR: SHX194

## 2019-08-12 HISTORY — DX: Dyspnea, unspecified: R06.00

## 2019-08-12 LAB — CBC WITH DIFFERENTIAL/PLATELET
Abs Immature Granulocytes: 0.04 10*3/uL (ref 0.00–0.07)
Basophils Absolute: 0 10*3/uL (ref 0.0–0.1)
Basophils Relative: 1 %
Eosinophils Absolute: 0.1 10*3/uL (ref 0.0–0.5)
Eosinophils Relative: 1 %
HCT: 36 % (ref 36.0–46.0)
Hemoglobin: 11.6 g/dL — ABNORMAL LOW (ref 12.0–15.0)
Immature Granulocytes: 1 %
Lymphocytes Relative: 18 %
Lymphs Abs: 1.5 10*3/uL (ref 0.7–4.0)
MCH: 27.2 pg (ref 26.0–34.0)
MCHC: 32.2 g/dL (ref 30.0–36.0)
MCV: 84.5 fL (ref 80.0–100.0)
Monocytes Absolute: 1 10*3/uL (ref 0.1–1.0)
Monocytes Relative: 12 %
Neutro Abs: 5.6 10*3/uL (ref 1.7–7.7)
Neutrophils Relative %: 67 %
Platelets: 393 10*3/uL (ref 150–400)
RBC: 4.26 MIL/uL (ref 3.87–5.11)
RDW: 14.3 % (ref 11.5–15.5)
WBC: 8.3 10*3/uL (ref 4.0–10.5)
nRBC: 0 % (ref 0.0–0.2)

## 2019-08-12 LAB — COMPREHENSIVE METABOLIC PANEL
ALT: 194 U/L — ABNORMAL HIGH (ref 0–44)
AST: 121 U/L — ABNORMAL HIGH (ref 15–41)
Albumin: 3.3 g/dL — ABNORMAL LOW (ref 3.5–5.0)
Alkaline Phosphatase: 102 U/L (ref 38–126)
BUN: 45 mg/dL — ABNORMAL HIGH (ref 8–23)
CO2: 18 mmol/L — ABNORMAL LOW (ref 22–32)
Calcium: 9.2 mg/dL (ref 8.9–10.3)
Chloride: 94 mmol/L — ABNORMAL LOW (ref 98–111)
Creatinine, Ser: 5.89 mg/dL — ABNORMAL HIGH (ref 0.44–1.00)
GFR calc Af Amer: 8 mL/min — ABNORMAL LOW (ref >60)
GFR calc non Af Amer: 7 mL/min — ABNORMAL LOW (ref >60)
Glucose, Bld: 155 mg/dL — ABNORMAL HIGH (ref 70–99)
Potassium: 3.7 mmol/L (ref 3.5–5.1)
Sodium: 133 mmol/L — ABNORMAL LOW (ref 135–145)
Total Bilirubin: 1.1 mg/dL (ref 0.3–1.2)
Total Protein: 8.4 g/dL — ABNORMAL HIGH (ref 6.5–8.1)

## 2019-08-12 LAB — URINALYSIS, ROUTINE W REFLEX MICROSCOPIC
Bacteria, UA: NONE SEEN
Glucose, UA: NEGATIVE mg/dL
Ketones, ur: NEGATIVE mg/dL
Leukocytes,Ua: NEGATIVE
Nitrite: NEGATIVE
Protein, ur: >=300 mg/dL — AB
Specific Gravity, Urine: 1.03 (ref 1.005–1.030)
pH: 5 (ref 5.0–8.0)

## 2019-08-12 LAB — APTT: aPTT: 31 seconds (ref 24–36)

## 2019-08-12 LAB — PROTIME-INR
INR: 1.3 — ABNORMAL HIGH (ref 0.8–1.2)
Prothrombin Time: 15.3 seconds — ABNORMAL HIGH (ref 11.4–15.2)

## 2019-08-12 LAB — SARS CORONAVIRUS 2 BY RT PCR (HOSPITAL ORDER, PERFORMED IN ~~LOC~~ HOSPITAL LAB): SARS Coronavirus 2: NEGATIVE

## 2019-08-12 LAB — SURGICAL PCR SCREEN
MRSA, PCR: NEGATIVE
Staphylococcus aureus: NEGATIVE

## 2019-08-12 LAB — LACTIC ACID, PLASMA: Lactic Acid, Venous: 2.8 mmol/L (ref 0.5–1.9)

## 2019-08-12 SURGERY — REPAIR, HERNIA, INGUINAL, LAPAROSCOPIC
Anesthesia: General | Site: Abdomen

## 2019-08-12 MED ORDER — SODIUM CHLORIDE 0.9 % IV SOLN
1.0000 g | Freq: Once | INTRAVENOUS | Status: AC
  Administered 2019-08-12: 1 g via INTRAVENOUS
  Filled 2019-08-12: qty 10

## 2019-08-12 MED ORDER — ONDANSETRON HCL 4 MG/2ML IJ SOLN
INTRAMUSCULAR | Status: DC | PRN
  Administered 2019-08-12: 4 mg via INTRAVENOUS

## 2019-08-12 MED ORDER — SODIUM CHLORIDE 0.9 % IV BOLUS: 500.0000 mL | Freq: Once | INTRAVENOUS | Status: DC

## 2019-08-12 MED ORDER — PHENYLEPHRINE 40 MCG/ML (10ML) SYRINGE FOR IV PUSH (FOR BLOOD PRESSURE SUPPORT)
PREFILLED_SYRINGE | INTRAVENOUS | Status: DC | PRN
  Administered 2019-08-12 (×3): 120 ug via INTRAVENOUS

## 2019-08-12 MED ORDER — EZETIMIBE 10 MG PO TABS
10.0000 mg | ORAL_TABLET | Freq: Every day | ORAL | Status: DC
  Administered 2019-08-14 – 2019-08-26 (×13): 10 mg via ORAL
  Filled 2019-08-12 (×14): qty 1

## 2019-08-12 MED ORDER — LEVETIRACETAM IN NACL 1500 MG/100ML IV SOLN
1500.0000 mg | Freq: Two times a day (BID) | INTRAVENOUS | Status: DC
  Administered 2019-08-13 – 2019-08-17 (×9): 1500 mg via INTRAVENOUS
  Filled 2019-08-12 (×10): qty 100

## 2019-08-12 MED ORDER — FENTANYL CITRATE (PF) 100 MCG/2ML IJ SOLN
INTRAMUSCULAR | Status: DC | PRN
  Administered 2019-08-12: 100 ug via INTRAVENOUS
  Administered 2019-08-12 (×3): 50 ug via INTRAVENOUS

## 2019-08-12 MED ORDER — HYDROMORPHONE HCL 1 MG/ML IJ SOLN
INTRAMUSCULAR | Status: AC
  Filled 2019-08-12: qty 1

## 2019-08-12 MED ORDER — 0.9 % SODIUM CHLORIDE (POUR BTL) OPTIME
TOPICAL | Status: DC | PRN
  Administered 2019-08-12: 1000 mL

## 2019-08-12 MED ORDER — PHENYLEPHRINE HCL-NACL 10-0.9 MG/250ML-% IV SOLN
INTRAVENOUS | Status: DC | PRN
Start: 2019-08-12 — End: 2019-08-12
  Administered 2019-08-12: 100 ug/min via INTRAVENOUS

## 2019-08-12 MED ORDER — BUPIVACAINE HCL 0.25 % IJ SOLN
INTRAMUSCULAR | Status: AC
  Filled 2019-08-12: qty 1

## 2019-08-12 MED ORDER — SODIUM CHLORIDE 0.9 % IV SOLN: INTRAVENOUS | Status: DC

## 2019-08-12 MED ORDER — LACTATED RINGERS IV SOLN
INTRAVENOUS | Status: DC | PRN
Start: 2019-08-12 — End: 2019-08-12

## 2019-08-12 MED ORDER — LAMOTRIGINE 25 MG PO TABS: 75.0000 mg | ORAL_TABLET | Freq: Two times a day (BID) | ORAL | Status: DC

## 2019-08-12 MED ORDER — ONDANSETRON HCL 4 MG/2ML IJ SOLN
INTRAMUSCULAR | Status: AC
  Filled 2019-08-12: qty 2

## 2019-08-12 MED ORDER — DIPHENHYDRAMINE HCL 50 MG/ML IJ SOLN
12.5000 mg | Freq: Four times a day (QID) | INTRAMUSCULAR | Status: DC | PRN
  Administered 2019-08-17: 12.5 mg via INTRAVENOUS
  Filled 2019-08-12: qty 1

## 2019-08-12 MED ORDER — MUPIROCIN 2 % EX OINT: 1.0000 "application " | TOPICAL_OINTMENT | Freq: Two times a day (BID) | CUTANEOUS | Status: DC

## 2019-08-12 MED ORDER — ONDANSETRON HCL 4 MG/2ML IJ SOLN
4.0000 mg | Freq: Once | INTRAMUSCULAR | Status: AC
  Administered 2019-08-12: 4 mg via INTRAVENOUS
  Filled 2019-08-12: qty 2

## 2019-08-12 MED ORDER — HYDROMORPHONE HCL 1 MG/ML IJ SOLN
0.5000 mg | INTRAMUSCULAR | Status: DC | PRN
  Administered 2019-08-12: 1 mg via INTRAVENOUS
  Administered 2019-08-13: 0.5 mg via INTRAVENOUS
  Administered 2019-08-13 (×2): 1 mg via INTRAVENOUS
  Administered 2019-08-13: 0.5 mg via INTRAVENOUS
  Administered 2019-08-13: 1 mg via INTRAVENOUS
  Administered 2019-08-14: 0.5 mg via INTRAVENOUS
  Administered 2019-08-14 (×3): 1 mg via INTRAVENOUS
  Administered 2019-08-15: 0.5 mg via INTRAVENOUS
  Administered 2019-08-15 – 2019-08-18 (×6): 1 mg via INTRAVENOUS
  Filled 2019-08-12 (×16): qty 1

## 2019-08-12 MED ORDER — SUGAMMADEX SODIUM 200 MG/2ML IV SOLN
INTRAVENOUS | Status: DC | PRN
  Administered 2019-08-12: 225 mg via INTRAVENOUS

## 2019-08-12 MED ORDER — PANTOPRAZOLE SODIUM 40 MG IV SOLR
40.0000 mg | Freq: Two times a day (BID) | INTRAVENOUS | Status: DC
  Administered 2019-08-12 – 2019-08-17 (×10): 40 mg via INTRAVENOUS
  Filled 2019-08-12 (×10): qty 40

## 2019-08-12 MED ORDER — FENTANYL CITRATE (PF) 250 MCG/5ML IJ SOLN
INTRAMUSCULAR | Status: AC
  Filled 2019-08-12: qty 5

## 2019-08-12 MED ORDER — ONDANSETRON HCL 4 MG/2ML IJ SOLN
4.0000 mg | INTRAMUSCULAR | Status: DC | PRN
  Administered 2019-08-18 (×2): 4 mg via INTRAVENOUS
  Filled 2019-08-12 (×2): qty 2

## 2019-08-12 MED ORDER — CHLORHEXIDINE GLUCONATE CLOTH 2 % EX PADS
6.0000 | MEDICATED_PAD | Freq: Every day | CUTANEOUS | Status: DC
  Administered 2019-08-13 – 2019-08-25 (×12): 6 via TOPICAL

## 2019-08-12 MED ORDER — DIPHENHYDRAMINE HCL 12.5 MG/5ML PO ELIX
12.5000 mg | ORAL_SOLUTION | Freq: Four times a day (QID) | ORAL | Status: DC | PRN
  Filled 2019-08-12: qty 5

## 2019-08-12 MED ORDER — MEPERIDINE HCL 50 MG/ML IJ SOLN: 6.2500 mg | INTRAMUSCULAR | Status: DC | PRN

## 2019-08-12 MED ORDER — SODIUM CHLORIDE 0.9 % IV BOLUS
1000.0000 mL | Freq: Once | INTRAVENOUS | Status: AC
  Administered 2019-08-12: 1000 mL via INTRAVENOUS

## 2019-08-12 MED ORDER — ONDANSETRON HCL 4 MG/2ML IJ SOLN: 4.0000 mg | Freq: Once | INTRAMUSCULAR | Status: DC | PRN

## 2019-08-12 MED ORDER — PHENYLEPHRINE HCL (PRESSORS) 10 MG/ML IV SOLN
INTRAVENOUS | Status: AC
  Filled 2019-08-12: qty 1

## 2019-08-12 MED ORDER — PROPOFOL 10 MG/ML IV BOLUS
INTRAVENOUS | Status: AC
  Filled 2019-08-12: qty 20

## 2019-08-12 MED ORDER — ZOLPIDEM TARTRATE 5 MG PO TABS
5.0000 mg | ORAL_TABLET | Freq: Every evening | ORAL | Status: DC | PRN
  Administered 2019-08-18 – 2019-08-21 (×5): 5 mg via ORAL
  Filled 2019-08-12 (×5): qty 1

## 2019-08-12 MED ORDER — ROCURONIUM BROMIDE 10 MG/ML (PF) SYRINGE
PREFILLED_SYRINGE | INTRAVENOUS | Status: DC | PRN
  Administered 2019-08-12: 50 mg via INTRAVENOUS

## 2019-08-12 MED ORDER — LIDOCAINE 2% (20 MG/ML) 5 ML SYRINGE
INTRAMUSCULAR | Status: DC | PRN
  Administered 2019-08-12: 100 mg via INTRAVENOUS

## 2019-08-12 MED ORDER — LIDOCAINE 2% (20 MG/ML) 5 ML SYRINGE
INTRAMUSCULAR | Status: AC
  Filled 2019-08-12: qty 5

## 2019-08-12 MED ORDER — LACTATED RINGERS IV SOLN: INTRAVENOUS | Status: DC

## 2019-08-12 MED ORDER — HYDROMORPHONE HCL 1 MG/ML IJ SOLN
0.2500 mg | INTRAMUSCULAR | Status: DC | PRN
  Administered 2019-08-12: 0.25 mg via INTRAVENOUS

## 2019-08-12 MED ORDER — BUPIVACAINE HCL 0.25 % IJ SOLN
INTRAMUSCULAR | Status: DC | PRN
  Administered 2019-08-12: 40 mL

## 2019-08-12 MED ORDER — FENTANYL CITRATE (PF) 100 MCG/2ML IJ SOLN
INTRAMUSCULAR | Status: AC
  Filled 2019-08-12: qty 2

## 2019-08-12 MED ORDER — SODIUM CHLORIDE 0.9 % IV SOLN
2.0000 g | Freq: Every day | INTRAVENOUS | Status: DC
  Administered 2019-08-12 – 2019-08-20 (×9): 2 g via INTRAVENOUS
  Filled 2019-08-12: qty 2
  Filled 2019-08-12 (×2): qty 20
  Filled 2019-08-12 (×2): qty 2
  Filled 2019-08-12: qty 20
  Filled 2019-08-12 (×2): qty 2
  Filled 2019-08-12 (×2): qty 20
  Filled 2019-08-12: qty 2

## 2019-08-12 MED ORDER — ROSUVASTATIN CALCIUM 20 MG PO TABS
40.0000 mg | ORAL_TABLET | Freq: Every day | ORAL | Status: DC
  Administered 2019-08-14 – 2019-08-26 (×13): 40 mg via ORAL
  Filled 2019-08-12 (×13): qty 2

## 2019-08-12 MED ORDER — FENTANYL CITRATE (PF) 100 MCG/2ML IJ SOLN
25.0000 ug | Freq: Once | INTRAMUSCULAR | Status: AC
  Administered 2019-08-12: 25 ug via INTRAVENOUS
  Filled 2019-08-12: qty 2

## 2019-08-12 MED ORDER — OXYCODONE HCL 5 MG PO TABS
5.0000 mg | ORAL_TABLET | ORAL | Status: DC | PRN
  Administered 2019-08-14 – 2019-08-20 (×12): 5 mg via ORAL
  Filled 2019-08-12 (×12): qty 1

## 2019-08-12 MED ORDER — SUGAMMADEX SODIUM 500 MG/5ML IV SOLN
INTRAVENOUS | Status: AC
  Filled 2019-08-12: qty 5

## 2019-08-12 MED ORDER — LEVETIRACETAM IN NACL 1500 MG/100ML IV SOLN
1500.0000 mg | Freq: Once | INTRAVENOUS | Status: AC
  Administered 2019-08-12: 1500 mg via INTRAVENOUS
  Filled 2019-08-12: qty 100

## 2019-08-12 MED ORDER — FLUOXETINE HCL 10 MG PO CAPS
10.0000 mg | ORAL_CAPSULE | Freq: Every day | ORAL | Status: DC
  Filled 2019-08-12: qty 1

## 2019-08-12 MED ORDER — PROPOFOL 10 MG/ML IV BOLUS
INTRAVENOUS | Status: DC | PRN
  Administered 2019-08-12: 100 mg via INTRAVENOUS

## 2019-08-12 MED ORDER — SODIUM CHLORIDE 0.9 % IV SOLN
INTRAVENOUS | Status: DC | PRN
Start: 2019-08-12 — End: 2019-08-12

## 2019-08-12 MED ORDER — SUCCINYLCHOLINE CHLORIDE 200 MG/10ML IV SOSY
PREFILLED_SYRINGE | INTRAVENOUS | Status: DC | PRN
  Administered 2019-08-12: 120 mg via INTRAVENOUS

## 2019-08-12 MED ORDER — LEVOTHYROXINE SODIUM 25 MCG PO TABS
137.0000 ug | ORAL_TABLET | Freq: Every day | ORAL | Status: DC
  Administered 2019-08-14 – 2019-08-26 (×13): 137 ug via ORAL
  Filled 2019-08-12 (×16): qty 1

## 2019-08-12 SURGICAL SUPPLY — 17 items
ADH SKN CLS APL DERMABOND .7 (GAUZE/BANDAGES/DRESSINGS) ×1
APL PRP STRL LF DISP 70% ISPRP (MISCELLANEOUS) ×1
CABLE HIGH FREQUENCY MONO STRZ (ELECTRODE) ×2 IMPLANT
CHLORAPREP W/TINT 26 (MISCELLANEOUS) ×2 IMPLANT
COVER SURGICAL LIGHT HANDLE (MISCELLANEOUS) ×2 IMPLANT
DERMABOND ADVANCED (GAUZE/BANDAGES/DRESSINGS) ×1
DERMABOND ADVANCED .7 DNX12 (GAUZE/BANDAGES/DRESSINGS) ×1 IMPLANT
ELECT REM PT RETURN 15FT ADLT (MISCELLANEOUS) ×2 IMPLANT
KIT BASIN (CUSTOM PROCEDURE TRAY) ×2 IMPLANT
SET TUBE SMOKE EVAC HIGH FLOW (TUBING) ×2 IMPLANT
SLEEVE XCEL OPT CAN 5 100 (ENDOMECHANICALS) ×4 IMPLANT
SUT ETHIBOND NAB CT1 #1 30IN (SUTURE) ×4 IMPLANT
SUT VICRYL 0 UR6 27IN ABS (SUTURE) ×2 IMPLANT
TOWEL OR 17X26 10 PK STRL BLUE (TOWEL DISPOSABLE) ×2 IMPLANT
TRAY FOLEY MTR SLVR 14FR STAT (SET/KITS/TRAYS/PACK) ×2 IMPLANT
TRAY LAPAROSCOPIC (CUSTOM PROCEDURE TRAY) ×2 IMPLANT
TROCAR BLADELESS OPT 5 100 (ENDOMECHANICALS) ×2 IMPLANT

## 2019-08-12 NOTE — ED Notes (Signed)
Patient was transferred from [facility] due to small bowel obstruction.

## 2019-08-12 NOTE — ED Notes (Signed)
[facility] called to set transportation at this time.

## 2019-08-12 NOTE — ED Notes (Signed)
Unable to obtain 2nd IV access. Will contact another nurse to try. Awaiting phlebotomy to draw 2nd set of cultures.

## 2019-08-12 NOTE — ED Triage Notes (Signed)
EMS reports pt had a cyst removed from kidney over a week ago and hasn't had a bm since the surgery.  Has been taking medication to help her have a bm but hasn't had a bm yet.

## 2019-08-12 NOTE — ED Notes (Signed)
Date and time results received: 08/12/19 12:10 PM (use smartphrase ".now" to insert current time)  Test: lactic acid Critical Value: 2.8  Name of Provider Notified: Zammit MD  Orders Received? Or Actions Taken?: na

## 2019-08-12 NOTE — Transfer of Care (Signed)
Immediate Anesthesia Transfer of Care Note  Patient: [name]  Procedure(s) Performed: laparoscopic incisional hernia repair (N/A Abdomen)  Patient Location: PACU  Anesthesia Type:General  Level of Consciousness: drowsy and patient cooperative  Airway & Oxygen Therapy: Patient Spontanous Breathing and Patient connected to face mask oxygen  Post-op Assessment: Report given to RN and Post -op Vital signs reviewed and stable  Post vital signs: Reviewed and stable  Last Vitals:  Vitals Value Taken Time  BP    Temp    Pulse    Resp    SpO2      Last Pain:  Vitals:   08/12/19 1900  TempSrc: Oral  PainSc: 0-No pain      Patients Stated Pain Goal: 5 (61/60/73 7106)  Complications: No complications documented.

## 2019-08-12 NOTE — ED Notes (Signed)
Pt to CT. Will start fluids and Keppra on return

## 2019-08-12 NOTE — Progress Notes (Signed)
NG re-verified by xray due to not being properly secured.

## 2019-08-12 NOTE — ED Notes (Signed)
Christian, [employer], called and stated that the pt will be picked up by OR at approximately 1845 for surgery and then will be transferred to her ICU room after surgery. Primary RN notified

## 2019-08-12 NOTE — Anesthesia Procedure Notes (Signed)
Central Venous Catheter Insertion Performed by: Lillia Abed, MD, anesthesiologist Patient location: Pre-op. Preanesthetic checklist: patient identified, IV checked, risks and benefits discussed, surgical consent, monitors and equipment checked, pre-op evaluation, timeout performed and anesthesia consent Position: Trendelenburg Patient sedated Hand hygiene performed  and maximum sterile barriers used  Catheter size: 8 Fr Total catheter length 16. Central line was placed.Double lumen Procedure performed using ultrasound guided technique. Ultrasound Notes:anatomy identified, needle tip was noted to be adjacent to the nerve/plexus identified, no ultrasound evidence of intravascular and/or intraneural injection and image(s) printed for medical record Attempts: 1 Following insertion, dressing applied and line sutured. Post procedure assessment: blood return through all ports, free fluid flow and no air  Patient tolerated the procedure well with no immediate complications.

## 2019-08-12 NOTE — ED Notes (Signed)
IV infiltrated prior to completion of bolus. MD made aware.

## 2019-08-12 NOTE — ED Provider Notes (Signed)
Patient being transferred from [facility] to be seen by Dr. Gloriann Loan with the urology service.  Patient had partial nephrectomy 8 days ago and has been at home but reports has not had a bowel movement since leaving the hospital and has had worsening abdominal pain and vomiting.  At any pen patient was found to have a lactic acid of 2.8, normal white count and new acute kidney injury with a creatinine of 5.6 from a creatinine of 1.75 .. 5 days ago.  Patient's Covid is negative urine without significant findings.  CT today showed changes consistent with partial small bowel obstruction secondary to a herniated loop of small bowel in the right anterior abdominal wall laterally through one of the laparoscopic ports.  Changes otherwise consistent with recent partial nephrectomy.   Blanchie Dessert, MD 08/12/19 [phone]

## 2019-08-12 NOTE — ED Provider Notes (Signed)
[facility] EMERGENCY DEPARTMENT Provider Note   CSN: [id number] Arrival date & time: 08/12/19  1036     History Chief Complaint  Patient presents with  . Constipation    [name] is a 64 y.o. female.  Patient complains of vomiting for 2 to 3 days.  Patient had a urology surgery done about a week ago.  Patient with mild abdominal discomfort  The history is provided by the patient. No language interpreter was used.  Constipation Severity:  Moderate Timing:  Constant Progression:  Worsening Chronicity:  New Context: dehydration   Stool description:  None produced Relieved by:  Nothing Associated symptoms: abdominal pain and vomiting   Associated symptoms: no back pain and no diarrhea        Past Medical History:  Diagnosis Date  . Depression   . Fractures   . Hyperlipidemia   . Hypertension   . Mental retardation, mild (I.Q. 50-70)    lives alone and cares for self has a nurse aide comes once a day   . Obesity   . Pneumonia   . Seizure ([place])    11/17/2012 "still having them; "I believe their from a tumor in my head" ((11/17/2012  . Seizure disorder ([place])   . Thyroid cancer [facility])     Patient Active Problem List   Diagnosis Date Noted  . Renal mass 08/05/2019  . Dysuria 05/10/2019  . Other specified disorders of kidney and ureter 05/10/2019  . Renal mass, right 05/10/2019  . Encounter for examination following treatment at hospital 05/10/2019  . Encounter for vaccination 05/10/2019  . Tachycardia 07/04/2018  . Acute renal failure superimposed on stage 3 chronic kidney disease ([place])   . Depression, major, single episode, mild ([place]) 03/21/2018  . CKD (chronic kidney disease) stage 3, GFR 30-59 ml/min 04/03/2016  . GERD (gastroesophageal reflux disease) 08/23/2015  . Osteoarthritis of right knee 08/15/2014  . 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 Reduced vision 05/03/2013  . 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 Vitamin D deficiency  08/16/2009  . POLYNEUROPATHY 04/10/2009  . Hyperlipidemia LDL goal <100 07/15/2007  . Morbid obesity ([place]) 07/15/2007  . 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 Essential hypertension 07/15/2007  . Seizure disorder ([place]) 07/15/2007    Past Surgical History:  Procedure Laterality Date  . ABDOMINAL HYSTERECTOMY  2001  . BREAST LUMPECTOMY  1996   right   . COLONOSCOPY N/A 05/16/2016   Procedure: COLONOSCOPY;  Surgeon: Danie Binder, MD;  Location: [facility];  Service: Endoscopy;  Laterality: N/A;  830   . ROBOTIC ASSITED PARTIAL NEPHRECTOMY Right 08/05/2019   Procedure: XI ROBOTIC ASSITED PARTIAL NEPHRECTOMY WITH INTRAOPERATIVE ULTRASOUND;  Surgeon: [name], MD;  Location: [facility] ORS;  Service: Urology;  Laterality: Right;  3 HRS  . THYROIDECTOMY Bilateral 11/17/2012   Procedure: TOTAL THYROIDECTOMY;  Surgeon: Ascencion Dike, MD;  Location: [place];  Service: ENT;  Laterality: Bilateral;  . TOTAL THYROIDECTOMY Bilateral 11/17/2012  . TUBAL LIGATION       OB History   No obstetric history on file.     Family History  Problem Relation Age of Onset  . Diabetes Mother     Social History   Tobacco Use  . Smoking status: Never Smoker  . Smokeless tobacco: Never Used  Vaping Use  . Vaping Use: Never used  Substance Use Topics  . Alcohol use: No  . Drug use: No    Home Medications Prior to Admission medications   Medication  Sig Start Date End Date Taking? Authorizing Provider  acetaminophen (TYLENOL) 500 MG tablet Take 1 tablet (500 mg total) by mouth 2 (two) times daily. Patient taking differently: Take 500 mg by mouth 2 (two) times daily as needed for mild pain or headache.  08/15/14   [name], MD  amLODipine (NORVASC) 5 MG tablet Take 1 tablet (5 mg total) by mouth daily. 08/30/18   [name], MD  benazepril (LOTENSIN) 20 MG tablet TAKE 1 TABLET BY MOUTH DAILY. Patient taking differently: Take 20 mg by mouth daily.  05/16/19   Perlie Mayo, NP    calcitRIOL (ROCALTROL) 0.25 MCG capsule Take 0.25 mcg by mouth daily. \    [provider]  ezetimibe (ZETIA) 10 MG tablet Take 1 tablet (10 mg total) by mouth daily. 04/04/19   [name], MD  FLUoxetine (PROZAC) 10 MG capsule Take 1 capsule (10 mg total) by mouth daily. 04/04/19   [name], MD  LAMICTAL 150 MG tablet Take 75 mg by mouth 2 (two) times daily. 06/17/19   [provider]  levETIRAcetam (KEPPRA) 750 MG tablet Two tablets twice daily Patient taking differently: Take 1,500 mg by mouth 2 (two) times daily.  04/28/17   [name], MD  levothyroxine (SYNTHROID) 137 MCG tablet Take 1 tablet (137 mcg total) by mouth daily before breakfast. 05/09/19   Nida, Marella Chimes, MD  loratadine (CLARITIN) 10 MG tablet TAKE ONE TABLET BY MOUTH ONCE DAILY. Patient taking differently: Take 10 mg by mouth daily.  06/16/19   [name], MD  omeprazole (PRILOSEC) 20 MG capsule TAKE 1 CAPSULE BY MOUTH ONCE DAILY. Patient taking differently: Take 20 mg by mouth daily.  05/16/19   Perlie Mayo, NP  rosuvastatin (CRESTOR) 40 MG tablet TAKE 1 TABLET BY MOUTH ONCE A DAY. Patient taking differently: Take 40 mg by mouth daily.  06/16/19   [name], MD    Allergies    Simvastatin  Review of Systems   Review of Systems  Constitutional: Negative for appetite change and fatigue.  HENT: Negative for congestion, ear discharge and sinus pressure.   Eyes: Negative for discharge.  Respiratory: Negative for cough.   Cardiovascular: Negative for chest pain.  Gastrointestinal: Positive for abdominal pain, constipation and vomiting. Negative for diarrhea.  Genitourinary: Negative for frequency and hematuria.  Musculoskeletal: Negative for back pain.  Skin: Negative for rash.  Neurological: Negative for seizures and headaches.  Psychiatric/Behavioral: Negative for hallucinations.    Physical Exam Updated Vital Signs BP 109/63   Pulse 87   Temp  98.1 F (36.7 C) (Oral)   Resp 20   Ht 5\' 5"  (1.651 m)   Wt 106.1 kg   SpO2 98%   BMI 38.94 kg/m   Physical Exam Vitals and nursing note reviewed.  Constitutional:      Appearance: She is well-developed.  HENT:     Head: Normocephalic.     Nose: Nose normal.  Eyes:     General: No scleral icterus.    Conjunctiva/sclera: Conjunctivae normal.  Neck:     Thyroid: No thyromegaly.  Cardiovascular:     Rate and Rhythm: Normal rate and regular rhythm.     Heart sounds: No murmur heard.  No friction rub. No gallop.   Pulmonary:     Breath sounds: No stridor. No wheezing or rales.  Chest:     Chest wall: No tenderness.  Abdominal:     General: There is  no distension.     Tenderness: There is abdominal tenderness. There is no rebound.  Musculoskeletal:        General: Normal range of motion.     Cervical back: Neck supple.  Lymphadenopathy:     Cervical: No cervical adenopathy.  Skin:    Findings: No erythema or rash.  Neurological:     Mental Status: She is alert and oriented to person, place, and time.     Motor: No abnormal muscle tone.     Coordination: Coordination normal.  Psychiatric:        Behavior: Behavior normal.     ED Results / Procedures / Treatments   Labs (all labs ordered are listed, but only abnormal results are displayed) Labs Reviewed  CBC WITH DIFFERENTIAL/PLATELET - Abnormal; Notable for the following components:      Result Value   Hemoglobin 11.6 (*)    All other components within normal limits  COMPREHENSIVE METABOLIC PANEL - Abnormal; Notable for the following components:   Sodium 133 (*)    Chloride 94 (*)    CO2 18 (*)    Glucose, Bld 155 (*)    BUN 45 (*)    Creatinine, Ser 5.89 (*)    Total Protein 8.4 (*)    Albumin 3.3 (*)    AST 121 (*)    ALT 194 (*)    GFR calc non Af Amer 7 (*)    GFR calc Af Amer 8 (*)    All other components within normal limits  URINALYSIS, ROUTINE W REFLEX MICROSCOPIC - Abnormal; Notable for the  following components:   Color, Urine AMBER (*)    APPearance CLOUDY (*)    Hgb urine dipstick MODERATE (*)    Bilirubin Urine SMALL (*)    Protein, ur >=300 (*)    Non Squamous Epithelial 0-5 (*)    All other components within normal limits  LACTIC ACID, PLASMA - Abnormal; Notable for the following components:   Lactic Acid, Venous 2.8 (*)    All other components within normal limits  PROTIME-INR - Abnormal; Notable for the following components:   Prothrombin Time 15.3 (*)    INR 1.3 (*)    All other components within normal limits  CULTURE, BLOOD (ROUTINE X 2)  URINE CULTURE  SARS CORONAVIRUS 2 BY RT PCR (HOSPITAL ORDER, [place] [facility])  APTT  LACTIC ACID, PLASMA    EKG None  Radiology CT ABDOMEN PELVIS WO CONTRAST  Result Date: 08/12/2019 CLINICAL DATA:  Nausea and vomiting, history of partial nephrectomy on the right several days ago EXAM: CT ABDOMEN AND PELVIS WITHOUT CONTRAST TECHNIQUE: Multidetector CT imaging of the abdomen and pelvis was performed following the standard protocol without IV contrast. COMPARISON:  05/02/2019 FINDINGS: Lower chest: Lung bases demonstrate mild atelectatic changes. Hepatobiliary: No focal liver abnormality is seen. No gallstones, gallbladder wall thickening, or biliary dilatation. Pancreas: Unremarkable. No pancreatic ductal dilatation or surrounding inflammatory changes. Spleen: Normal in size without focal abnormality. Adrenals/Urinary Tract: Adrenal glands are within normal limits. Left kidney shows small cysts similar to that seen on prior CT examination. The right kidney demonstrates changes of prior partial nephrectomy as well as cyst decortication. Air is noted in the operative bed. No obstructive changes are seen. Bladder partially distended. Stomach/Bowel: Colon is decompressed. The appendix is within normal limits. Multiple dilated loops of small bowel are identified filled with fluid consistent with at least partial  small bowel obstruction. This involves the entirety of  the jejunum in the majority of the ileum. The distal ileum is within normal limits. The obstructive changes secondary to partial herniation of a small bowel loop via one of the laparoscopic ports best seen on image number 66 of series 2. More distal small bowel is within normal limits. Vascular/Lymphatic: Aortic atherosclerosis. No enlarged abdominal or pelvic lymph nodes. Reproductive: Status post hysterectomy. No adnexal masses. Other: Considerable subcutaneous air is identified within the anterior abdominal wall related to the recent laparoscopic surgery. No significant free air or free fluid is noted within the abdomen. Musculoskeletal: No acute or significant osseous findings. IMPRESSION: Changes consistent with partial small bowel obstruction secondary to a herniated loop of small bowel in the right anterior abdominal wall laterally through 1 of the laparoscopic ports. Changes consistent with the recent partial nephrectomy on the right and cyst decortication Electronically Signed   By: Inez Catalina M.D.   On: 08/12/2019 12:58   DG Chest Port 1 View  Result Date: 08/12/2019 CLINICAL DATA:  Shortness of breath.  Diaphoresis. EXAM: PORTABLE CHEST 1 VIEW COMPARISON:  One-view chest x-ray 04/02/2018 FINDINGS: The heart size is exaggerated by low lung volumes. Mild bibasilar atelectasis is evident, left greater than right. No significant airspace consolidation present. No effusion or edema is present. Atherosclerotic changes are present the aortic arch. Scarring in the right upper lobe is stable. IMPRESSION: 1. Low lung volumes and mild bibasilar atelectasis, left greater than right. 2. No acute cardiopulmonary disease. 3. Aortic atherosclerosis. Electronically Signed   By: San Morelle M.D.   On: 08/12/2019 11:50    Procedures Procedures (including critical care time)  Medications Ordered in ED Medications  levETIRAcetam (KEPPRA) IVPB 1500  mg/ 100 mL premix (1,500 mg Intravenous New Bag/Given 08/12/19 1326)  sodium chloride 0.9 % bolus 1,000 mL (0 mLs Intravenous Stopped 08/12/19 1229)  cefTRIAXone (ROCEPHIN) 1 g in sodium chloride 0.9 % 100 mL IVPB (0 g Intravenous Stopped 08/12/19 1320)  sodium chloride 0.9 % bolus 1,000 mL (1,000 mLs Intravenous New Bag/Given 08/12/19 1326)    ED Course  I have reviewed the triage vital signs and the nursing notes.  Pertinent labs & imaging results that were available during my care of the patient were reviewed by me and considered in my medical decision making (see chart for details). CRITICAL CARE Performed by: Milton Ferguson Total critical care time: 45 minutes Critical care time was exclusive of separately billable procedures and treating other patients. Critical care was necessary to treat or prevent imminent or life-threatening deterioration. Critical care was time spent personally by me on the following activities: development of treatment plan with patient and/or surrogate as well as nursing, discussions with consultants, evaluation of patient's response to treatment, examination of patient, obtaining history from patient or surrogate, ordering and performing treatments and interventions, ordering and review of laboratory studies, ordering and review of radiographic studies, pulse oximetry and re-evaluation of patient's condition.    MDM Rules/Calculators/A&P                          CT scan shows small bowel obstruction.  Patient will be sent to [facility] emergency department for Dr. Gloriann Loan urologist to see.  And eventually be admitted       ,jz  This patient presents to the ED for concern of vomiting, this involves an extensive number of treatment options, and is a complaint that carries with it a high risk of complications and morbidity.  The  differential diagnosis includes urinary tract infection bowel obstruction   Lab Tests:   I Ordered, reviewed, and interpreted labs,  which included CBC and chemistries which showed significant renal failure elevated lactic mild anemia and possible urinary tract infection  Medicines ordered:   I ordered medication Zofran for nausea and saline for dehydration  Imaging Studies ordered:   I ordered imaging studies which included CT abdomen and  I independently visualized and interpreted imaging which showed small bowel obstruction  Additional history obtained:   Additional history obtained from records  Previous records obtained and reviewed   Consultations Obtained:   I consulted urology and discussed lab and imaging findings  Reevaluation:  After the interventions stated above, I reevaluated the patient and found mild improvement  Critical Interventions:  .   Final Clinical Impression(s) / ED Diagnoses Final diagnoses:  None    Rx / DC Orders ED Discharge Orders    None       Milton Ferguson, MD 08/12/19 1338

## 2019-08-12 NOTE — Anesthesia Preprocedure Evaluation (Signed)
Anesthesia Evaluation  Patient identified by MRN, date of birth, ID band Patient awake    Reviewed: Allergy & Precautions, NPO status , Patient's Chart, lab work & pertinent test results  Airway Mallampati: II  TM Distance: >3 FB Neck ROM: Full    Dental   Pulmonary    Pulmonary exam normal        Cardiovascular hypertension, Pt. on medications Normal cardiovascular exam     Neuro/Psych Seizures -,  Depression    GI/Hepatic GERD  Medicated and Controlled,  Endo/Other    Renal/GU      Musculoskeletal   Abdominal   Peds  Hematology   Anesthesia Other Findings   Reproductive/Obstetrics                             Anesthesia Physical Anesthesia Plan  ASA: III  Anesthesia Plan: General   Post-op Pain Management:    Induction: Intravenous, Rapid sequence and Cricoid pressure planned  PONV Risk Score and Plan: 3 and Ondansetron and Midazolam  Airway Management Planned: Oral ETT  Additional Equipment:   Intra-op Plan:   Post-operative Plan: Extubation in OR  Informed Consent: I have reviewed the patients History and Physical, chart, labs and discussed the procedure including the risks, benefits and alternatives for the proposed anesthesia with the patient or authorized representative who has indicated his/her understanding and acceptance.       Plan Discussed with: CRNA and Surgeon  Anesthesia Plan Comments:         Anesthesia Quick Evaluation

## 2019-08-12 NOTE — Op Note (Signed)
Operative Note  Preoperative diagnosis:  1.  Port site incisional hernia  Postoperative diagnosis: 1.  Port site incisional hernia  Procedure(s): 1.  Diagnostic laparoscopy with port site incisional hernia repair  Surgeon: Link Snuffer, MD  Assistants: [name], MD, an assistant was necessary for the assisting with laparoscopic instruments and passing instruments, etc. as well as the complexity of the case.  Anesthesia: General  Complications: None immediate  EBL: Minimal  Specimens: 1.  None  Drains/Catheters: 1.  None  Intraoperative findings: Port site incisional hernia in the right inferior most port site.  After release of bowel, this was inspected and was viable.  This was inspected at the conclusion of the case prior to removal of the camera port and this was also viable.  Indication: 64 year old female status post right laparoscopic robotic assisted partial nephrectomy presented with several day history of nausea and vomiting.  Creatinine was 5.89 and abdomen was distended.  CT scan was performed that revealed a port site incisional hernia.  Decision was made to proceed urgently to the operating room for the above operation.  Description of procedure:  The patient was identified and consent was obtained.  The patient was taken to the operating room and placed in the supine position.  The patient was placed under general anesthesia.  Perioperative antibiotics were administered.  The patient was placed in right lateral position with all pressure points padded.  Patient was prepped and draped in a standard sterile fashion and a timeout was performed.  A 5 mm port was first inserted into the previous port that was in the middle of the abdomen after incising into the skin.  Inspection of the abdomen with the 30 degree camera revealed an obvious hernia in the area inferiormost right-sided port site.  A second port was inserted into the prior port site that was more superior  under direct visualization.  A bowel retractor was then used to gently sweep the bowel out of the port site.  The bowel appeared viable.  There was no evidence of necrosis.  There was no evidence of any compromise.  The remainder of the ports were also visualized.  The port site hernia was closed with a [place] and 0 Ethibond suture x2.  Close the other 2 port sites that we used under direct visualization with the [place] with 0 Ethibond suture.  All of these were secured down.  Of note, prior to removing the camera port and securing stitches down, the bowel was reinspected and appeared viable without necrosis.  We closed the subcutaneous tissue with interrupted 2-0 Vicryl sutures followed by closure of the skin with staples.  Exparel was used for anesthetic effect.  This concluded the operation.  Patient tolerated procedure well was stable postoperative.    Plan: Patient will be transferred to stepdown.  Continue NG tube.  Continue Foley catheter and n.p.o.  Nephrology has been consulted and will follow the patient in regards to her renal function.

## 2019-08-12 NOTE — H&P (Signed)
H&P  Chief Complaint: Partial small bowel obstruction, acute renal insufficiency  History of Present Illness: 64 year old female status post right robotic assisted laparoscopic partial nephrectomy on 08/05/2019.  She has multiple medical comorbidities including obesity, mild developmental delay, seizure disorder, and stage III renal insufficiency.  At the time of discharge, the patient was tolerating p.o.  The patient is a poor historian.  However, it sounds like she has been having problems tolerating p.o. since discharge and has been having a great deal of nausea and vomiting.  She is actively vomiting upon my assessment.  In the emergency department, she was noted to be hypotensive and tachycardic.  Labs were notable for acute renal insufficiency with a creatinine of 5.89 from a creatinine of 1.75 at discharge.  Lactic acid was mildly elevated at 2.8.  No leukocytosis.  She had 1 g of ceftriaxone in the emergency department.  She underwent a CT scan of the abdomen and pelvis without contrast which revealed multiple dilated loops of small bowel consistent with partial small bowel obstruction which was secondary to partial herniation of small bowel into the right-sided lateral laparoscopic port.  There was no hydronephrosis bilaterally.  Right kidney has evidence of recent partial nephrectomy as expected.  No evidence of obstruction however.  Past Medical History:  Diagnosis Date  . Depression   . Fractures   . Hyperlipidemia   . Hypertension   . Mental retardation, mild (I.Q. 50-70)    lives alone and cares for self has a nurse aide comes once a day   . Obesity   . Pneumonia   . Seizure ([place])    11/17/2012 "still having them; "I believe their from a tumor in my head" ((11/17/2012  . Seizure disorder ([place])   . Thyroid cancer [facility])    Past Surgical History:  Procedure Laterality Date  . ABDOMINAL HYSTERECTOMY  2001  . BREAST LUMPECTOMY  1996   right   . COLONOSCOPY N/A 05/16/2016   Procedure:  COLONOSCOPY;  Surgeon: Danie Binder, MD;  Location: [facility];  Service: Endoscopy;  Laterality: N/A;  830   . ROBOTIC ASSITED PARTIAL NEPHRECTOMY Right 08/05/2019   Procedure: XI ROBOTIC ASSITED PARTIAL NEPHRECTOMY WITH INTRAOPERATIVE ULTRASOUND;  Surgeon: [name], MD;  Location: [facility];  Service: Urology;  Laterality: Right;  3 HRS  . THYROIDECTOMY Bilateral 11/17/2012   Procedure: TOTAL THYROIDECTOMY;  Surgeon: Ascencion Dike, MD;  Location: [facility];  Service: ENT;  Laterality: Bilateral;  . TOTAL THYROIDECTOMY Bilateral 11/17/2012  . TUBAL LIGATION      Home Medications:  (Not in a hospital admission)  Allergies:  Allergies  Allergen Reactions  . Simvastatin Other (See Comments)    Muscle aches    Family History  Problem Relation Age of Onset  . Diabetes Mother    Social History:  reports that she has never smoked. She has never used smokeless tobacco. She reports that she does not drink alcohol and does not use drugs.  ROS: A complete review of systems was performed.  All systems are negative except for pertinent findings as noted. ROS   Physical Exam:  Vital signs in last 24 hours: Temp:  [98.1 F (36.7 C)-98.4 F (36.9 C)] 98.4 F (36.9 C) (06/18 1548) Pulse Rate:  [78-121] 101 (06/18 1548) Resp:  [17-24] 23 (06/18 1606) BP: (85-132)/(40-116) 94/40 (06/18 1606) SpO2:  [96 %-100 %] 100 % (06/18 1612) Weight:  [106.1 kg] 106.1 kg (06/18 1041) General:  Alert and oriented, No acute  distress HEENT: Normocephalic, atraumatic Neck: No JVD or lymphadenopathy Cardiovascular: Regular rate and rhythm Lungs: Regular rate and effort Abdomen: Soft, mildly tender, distended, incisions intact.  There was no peritoneal signs.  No abdominal masses Back: No CVA tenderness Extremities: No edema Neurologic: Mild MR  Laboratory Data:  Results for orders placed or performed during the hospital encounter of 08/12/19 (from the past 24 hour(s))  CBC with Differential/Platelet      Status: Abnormal   Collection Time: 08/12/19 11:12 AM  Result Value Ref Range   WBC 8.3 4.0 - 10.5 K/uL   RBC 4.26 3.87 - 5.11 MIL/uL   Hemoglobin 11.6 (L) 12.0 - 15.0 g/dL   HCT 36.0 36 - 46 %   MCV 84.5 80.0 - 100.0 fL   MCH 27.2 26.0 - 34.0 pg   MCHC 32.2 30.0 - 36.0 g/dL   RDW 14.3 11.5 - 15.5 %   Platelets 393 150 - 400 K/uL   nRBC 0.0 0.0 - 0.2 %   Neutrophils Relative % 67 %   Neutro Abs 5.6 1.7 - 7.7 K/uL   Lymphocytes Relative 18 %   Lymphs Abs 1.5 0.7 - 4.0 K/uL   Monocytes Relative 12 %   Monocytes Absolute 1.0 0 - 1 K/uL   Eosinophils Relative 1 %   Eosinophils Absolute 0.1 0 - 0 K/uL   Basophils Relative 1 %   Basophils Absolute 0.0 0 - 0 K/uL   WBC Morphology VACUOLATED NEUTROPHILS    Immature Granulocytes 1 %   Abs Immature Granulocytes 0.04 0.00 - 0.07 K/uL   Dohle Bodies PRESENT   Comprehensive metabolic panel     Status: Abnormal   Collection Time: 08/12/19 11:12 AM  Result Value Ref Range   Sodium 133 (L) 135 - 145 mmol/L   Potassium 3.7 3.5 - 5.1 mmol/L   Chloride 94 (L) 98 - 111 mmol/L   CO2 18 (L) 22 - 32 mmol/L   Glucose, Bld 155 (H) 70 - 99 mg/dL   BUN 45 (H) 8 - 23 mg/dL   Creatinine, Ser 5.89 (H) 0.44 - 1.00 mg/dL   Calcium 9.2 8.9 - 10.3 mg/dL   Total Protein 8.4 (H) 6.5 - 8.1 g/dL   Albumin 3.3 (L) 3.5 - 5.0 g/dL   AST 121 (H) 15 - 41 U/L   ALT 194 (H) 0 - 44 U/L   Alkaline Phosphatase 102 38 - 126 U/L   Total Bilirubin 1.1 0.3 - 1.2 mg/dL   GFR calc non Af Amer 7 (L) >60 mL/min   GFR calc Af Amer 8 (L) >60 mL/min  Urinalysis, Routine w reflex microscopic     Status: Abnormal   Collection Time: 08/12/19 11:30 AM  Result Value Ref Range   Color, Urine AMBER (A) YELLOW   APPearance CLOUDY (A) CLEAR   Specific Gravity, Urine 1.030 1.005 - 1.030   pH 5.0 5.0 - 8.0   Glucose, UA NEGATIVE NEGATIVE mg/dL   Hgb urine dipstick MODERATE (A) NEGATIVE   Bilirubin Urine SMALL (A) NEGATIVE   Ketones, ur NEGATIVE NEGATIVE mg/dL   Protein, ur  >=300 (A) NEGATIVE mg/dL   Nitrite NEGATIVE NEGATIVE   Leukocytes,Ua NEGATIVE NEGATIVE   RBC / HPF 21-50 0 - 5 RBC/hpf   WBC, UA 11-20 0 - 5 WBC/hpf   Bacteria, UA NONE SEEN NONE SEEN   Squamous Epithelial / LPF 6-10 0 - 5   WBC Clumps PRESENT    Mucus PRESENT    Budding Yeast PRESENT  Hyaline Casts, UA PRESENT    Uric Acid Crys, UA PRESENT    Non Squamous Epithelial 0-5 (A) NONE SEEN  Lactic acid, plasma     Status: Abnormal   Collection Time: 08/12/19 11:32 AM  Result Value Ref Range   Lactic Acid, Venous 2.8 (HH) 0.5 - 1.9 mmol/L  APTT     Status: None   Collection Time: 08/12/19 11:32 AM  Result Value Ref Range   aPTT 31 24 - 36 seconds  Protime-INR     Status: Abnormal   Collection Time: 08/12/19 11:32 AM  Result Value Ref Range   Prothrombin Time 15.3 (H) 11.4 - 15.2 seconds   INR 1.3 (H) 0.8 - 1.2  Blood Culture (routine x 2)     Status: None (Preliminary result)   Collection Time: 08/12/19 11:32 AM   Specimen: BLOOD LEFT ARM  Result Value Ref Range   Specimen Description BLOOD LEFT ARM    Special Requests      BOTTLES DRAWN AEROBIC AND ANAEROBIC Blood Culture results may not be optimal due to an excessive volume of blood received in culture bottles Performed at [facility], [address] [place], [place] [address]    Culture PENDING    Report Status PENDING   SARS Coronavirus 2 by RT PCR (hospital order, performed in [place] hospital lab) Nasopharyngeal Nasopharyngeal Swab     Status: None   Collection Time: 08/12/19  1:41 PM   Specimen: Nasopharyngeal Swab  Result Value Ref Range   SARS Coronavirus 2 NEGATIVE NEGATIVE   Recent Results (from the past 240 hour(s))  Blood Culture (routine x 2)     Status: None (Preliminary result)   Collection Time: 08/12/19 11:32 AM   Specimen: BLOOD LEFT ARM  Result Value Ref Range Status   Specimen Description BLOOD LEFT ARM  Final   Special Requests   Final    BOTTLES DRAWN AEROBIC AND ANAEROBIC Blood Culture  results may not be optimal due to an excessive volume of blood received in culture bottles Performed at [facility], [address], [place], [place] [address]    Culture PENDING  Incomplete   Report Status PENDING  Incomplete  SARS Coronavirus 2 by RT PCR (hospital order, performed in [place] hospital lab) Nasopharyngeal Nasopharyngeal Swab     Status: None   Collection Time: 08/12/19  1:41 PM   Specimen: Nasopharyngeal Swab  Result Value Ref Range Status   SARS Coronavirus 2 NEGATIVE NEGATIVE Final    Comment: (NOTE) SARS-CoV-2 target nucleic acids are NOT DETECTED.  The SARS-CoV-2 RNA is generally detectable in upper and lower respiratory specimens during the acute phase of infection. The lowest concentration of SARS-CoV-2 viral copies this assay can detect is 250 copies / mL. A negative result does not preclude SARS-CoV-2 infection and should not be used as the sole basis for treatment or other patient management decisions.  A negative result may occur with improper specimen collection / handling, submission of specimen other than nasopharyngeal swab, presence of viral mutation(s) within the areas targeted by this assay, and inadequate number of viral copies (<250 copies / mL). A negative result must be combined with clinical observations, patient history, and epidemiological information.  Fact Sheet for Patients:   StrictlyIdeas.no  Fact Sheet for Healthcare Providers: BankingDealers.co.za  This test is not yet approved or  cleared by the [place] FDA and has been authorized for detection and/or diagnosis of SARS-CoV-2 by FDA under an Emergency Use Authorization (EUA).  This EUA will remain in effect (meaning this test can be used) for the duration of the COVID-19 declaration under Section 564(b)(1) of the Act, 21 U.S.C. section 360bbb-3(b)(1), unless the authorization is terminated or revoked sooner.  Performed at  [facility], [address], [place], [place] [address]    Creatinine: Recent Labs    08/06/19 1696 08/07/19 0538 08/12/19 1112  CREATININE 1.60* 1.75* 5.89*   CT scan personally reviewed and is detailed in the history of present illness  Impression/Assessment:  Partial small bowel obstruction Acute on chronic stage III renal insufficiency  Plan:  Plan to start IV fluids.  Place NG tube.  She is n.p.o.  I will consult nephrology given her renal insufficiency.  For her partial small bowel obstruction, we will proceed to the operating room for laparoscopic port site hernia repair.  I discussed the possibility of small bowel resection with anastomosis.  She is in agreement.  Marton Redwood, III 08/12/2019, 5:06 PM

## 2019-08-12 NOTE — Discharge Instructions (Signed)

## 2019-08-12 NOTE — ED Notes (Signed)
Attempted to contact [facility] ED twice. No answer. Will try again to give report.

## 2019-08-12 NOTE — Anesthesia Postprocedure Evaluation (Signed)
Anesthesia Post Note  Patient: [name]  Procedure(s) Performed: laparoscopic incisional hernia repair (N/A Abdomen)     Patient location during evaluation: PACU Anesthesia Type: General Level of consciousness: awake and alert Pain management: pain level controlled Vital Signs Assessment: post-procedure vital signs reviewed and stable Respiratory status: spontaneous breathing, nonlabored ventilation, respiratory function stable and patient connected to nasal cannula oxygen Cardiovascular status: blood pressure returned to baseline and stable Postop Assessment: no apparent nausea or vomiting Anesthetic complications: no   No complications documented.  Last Vitals:  Vitals:   08/12/19 2200 08/12/19 2224  BP: 115/71   Pulse:    Resp: 19 20  Temp:  (!) 36.3 C  SpO2:      Last Pain:  Vitals:   08/12/19 2224  TempSrc: Axillary  PainSc:                  Ansen Sayegh DAVID

## 2019-08-12 NOTE — Anesthesia Procedure Notes (Signed)
Procedure Name: Intubation Date/Time: 08/12/2019 7:38 PM Performed by: Montel Clock, CRNA Pre-anesthesia Checklist: Patient identified, Emergency Drugs available, Suction available, Patient being monitored and Timeout performed Patient Re-evaluated:Patient Re-evaluated prior to induction Oxygen Delivery Method: Circle system utilized Preoxygenation: Pre-oxygenation with 100% oxygen Induction Type: IV induction, Rapid sequence and Cricoid Pressure applied Laryngoscope Size: Mac and 3 Grade View: Grade II Tube type: Oral Tube size: 7.0 mm Number of attempts: 1 Airway Equipment and Method: Stylet Placement Confirmation: ETT inserted through vocal cords under direct vision,  positive ETCO2 and breath sounds checked- equal and bilateral Secured at: 21 cm Tube secured with: Tape Dental Injury: Teeth and Oropharynx as per pre-operative assessment

## 2019-08-13 ENCOUNTER — Encounter (HOSPITAL_COMMUNITY): Payer: Self-pay | Admitting: Urology

## 2019-08-13 DIAGNOSIS — K431 Incisional hernia with gangrene: Secondary | ICD-10-CM | POA: Diagnosis present

## 2019-08-13 LAB — CBC
HCT: 29.7 % — ABNORMAL LOW (ref 36.0–46.0)
Hemoglobin: 9.3 g/dL — ABNORMAL LOW (ref 12.0–15.0)
MCH: 27.4 pg (ref 26.0–34.0)
MCHC: 31.3 g/dL (ref 30.0–36.0)
MCV: 87.6 fL (ref 80.0–100.0)
Platelets: 272 10*3/uL (ref 150–400)
RBC: 3.39 MIL/uL — ABNORMAL LOW (ref 3.87–5.11)
RDW: 14.7 % (ref 11.5–15.5)
WBC: 4.8 10*3/uL (ref 4.0–10.5)
nRBC: 0 % (ref 0.0–0.2)

## 2019-08-13 LAB — BASIC METABOLIC PANEL
Anion gap: 8 (ref 5–15)
BUN: 47 mg/dL — ABNORMAL HIGH (ref 8–23)
CO2: 23 mmol/L (ref 22–32)
Calcium: 7.3 mg/dL — ABNORMAL LOW (ref 8.9–10.3)
Chloride: 105 mmol/L (ref 98–111)
Creatinine, Ser: 5.62 mg/dL — ABNORMAL HIGH (ref 0.44–1.00)
GFR calc Af Amer: 8 mL/min — ABNORMAL LOW (ref >60)
GFR calc non Af Amer: 7 mL/min — ABNORMAL LOW (ref >60)
Glucose, Bld: 101 mg/dL — ABNORMAL HIGH (ref 70–99)
Potassium: 3.7 mmol/L (ref 3.5–5.1)
Sodium: 136 mmol/L (ref 135–145)

## 2019-08-13 LAB — LACTIC ACID, PLASMA: Lactic Acid, Venous: 0.8 mmol/L (ref 0.5–1.9)

## 2019-08-13 LAB — URINE CULTURE: Culture: NO GROWTH

## 2019-08-13 LAB — HIV ANTIBODY (ROUTINE TESTING W REFLEX): HIV Screen 4th Generation wRfx: NONREACTIVE

## 2019-08-13 MED ORDER — LAMOTRIGINE 25 MG PO TABS
75.0000 mg | ORAL_TABLET | Freq: Two times a day (BID) | ORAL | Status: DC
  Administered 2019-08-13 – 2019-08-17 (×9): 75 mg
  Filled 2019-08-13 (×9): qty 3

## 2019-08-13 MED ORDER — ORAL CARE MOUTH RINSE
15.0000 mL | Freq: Two times a day (BID) | OROMUCOSAL | Status: DC
  Administered 2019-08-13 – 2019-08-26 (×26): 15 mL via OROMUCOSAL

## 2019-08-13 MED ORDER — LAMOTRIGINE 25 MG PO TABS: 75.0000 mg | ORAL_TABLET | Freq: Two times a day (BID) | ORAL | Status: DC

## 2019-08-13 MED ORDER — FLUOXETINE HCL 10 MG PO CAPS
10.0000 mg | ORAL_CAPSULE | Freq: Every day | ORAL | Status: DC
  Administered 2019-08-13 – 2019-08-17 (×5): 10 mg
  Filled 2019-08-13 (×5): qty 1

## 2019-08-13 MED ORDER — SODIUM CHLORIDE 0.9% FLUSH: 10.0000 mL | INTRAVENOUS | Status: DC | PRN

## 2019-08-13 MED ORDER — FLUOXETINE HCL 10 MG PO CAPS: 10.0000 mg | ORAL_CAPSULE | Freq: Every day | ORAL | Status: DC

## 2019-08-13 MED ORDER — SODIUM CHLORIDE 0.9% FLUSH
10.0000 mL | Freq: Two times a day (BID) | INTRAVENOUS | Status: DC
  Administered 2019-08-13 – 2019-08-18 (×10): 10 mL
  Administered 2019-08-18: 20 mL
  Administered 2019-08-19: 10 mL
  Administered 2019-08-19: 20 mL
  Administered 2019-08-20 – 2019-08-21 (×2): 10 mL

## 2019-08-13 NOTE — Progress Notes (Signed)
Four new lap sites from procedure this evening, all approximated with staples

## 2019-08-13 NOTE — Progress Notes (Signed)
1 Day Post-Op Subjective: Patient reports right-sided abdominal pain.  No flatus.  Objective: Vital signs in last 24 hours: Temp:  [97.4 F (36.3 C)-98.4 F (36.9 C)] 97.6 F (36.4 C) (06/19 0816) Pulse Rate:  [56-147] 84 (06/19 1100) Resp:  [13-38] 15 (06/19 1100) BP: (66-132)/(26-116) 117/50 (06/19 1100) SpO2:  [92 %-100 %] 98 % (06/19 1100) Weight:  [108.9 kg] 108.9 kg (06/18 2224)  Intake/Output from previous day: 06/18 0701 - 06/19 0700 In: 4965.7 [I.V.:3805.7; NG/GT:60; IV Piggyback:1100] Out: 0258 [Urine:375; Emesis/NG output:1100; Blood:20] Intake/Output this shift: Total I/O In: 862.6 [I.V.:662.6; NG/GT:100; IV Piggyback:100] Out: -   Physical Exam:  No acute distress, watching TV Cardiovascular-regular rhythm Lungs-regular effort Abdomen-soft with some mild incisional tenderness over the right.  No rebound or guarding.  Good bowel sounds. Extremity-no calf pain or swelling Urine clear in tubing  Lab Results: Recent Labs    08/12/19 1112 08/13/19 0513  HGB 11.6* 9.3*  HCT 36.0 29.7*   BMET Recent Labs    08/12/19 1112 08/13/19 0513  NA 133* 136  K 3.7 3.7  CL 94* 105  CO2 18* 23  GLUCOSE 155* 101*  BUN 45* 47*  CREATININE 5.89* 5.62*  CALCIUM 9.2 7.3*   Recent Labs    08/12/19 1132  INR 1.3*   No results for input(s): LABURIN in the last 72 hours. Results for orders placed or performed during the hospital encounter of 08/12/19  Blood Culture (routine x 2)     Status: None (Preliminary result)   Collection Time: 08/12/19 11:32 AM   Specimen: BLOOD LEFT ARM  Result Value Ref Range Status   Specimen Description BLOOD LEFT ARM  Final   Special Requests   Final    BOTTLES DRAWN AEROBIC AND ANAEROBIC Blood Culture results may not be optimal due to an excessive volume of blood received in culture bottles   Culture   Final    NO GROWTH < 24 HOURS Performed at [facility], [address] [place], [place] [address]    Report Status PENDING   Incomplete  SARS Coronavirus 2 by RT PCR (hospital order, performed in [place] hospital lab) Nasopharyngeal Nasopharyngeal Swab     Status: None   Collection Time: 08/12/19  1:41 PM   Specimen: Nasopharyngeal Swab  Result Value Ref Range Status   SARS Coronavirus 2 NEGATIVE NEGATIVE Final    Comment: (NOTE) SARS-CoV-2 target nucleic acids are NOT DETECTED.  The SARS-CoV-2 RNA is generally detectable in upper and lower respiratory specimens during the acute phase of infection. The lowest concentration of SARS-CoV-2 viral copies this assay can detect is 250 copies / mL. A negative result does not preclude SARS-CoV-2 infection and should not be used as the sole basis for treatment or other patient management decisions.  A negative result may occur with improper specimen collection / handling, submission of specimen other than nasopharyngeal swab, presence of viral mutation(s) within the areas targeted by this assay, and inadequate number of viral copies (<250 copies / mL). A negative result must be combined with clinical observations, patient history, and epidemiological information.  Fact Sheet for Patients:   StrictlyIdeas.no  Fact Sheet for Healthcare Providers: BankingDealers.co.za  This test is not yet approved or  cleared by the [place] FDA and has been authorized for detection and/or diagnosis of SARS-CoV-2 by FDA under an Emergency Use Authorization (EUA).  This EUA will remain in effect (meaning this test can be used) for the duration of the COVID-19 declaration under  Section 564(b)(1) of the Act, 21 U.S.C. section 360bbb-3(b)(1), unless the authorization is terminated or revoked sooner.  Performed at [facility], [address], [place], [place] [address]   Surgical PCR screen     Status: None   Collection Time: 08/12/19  7:05 PM   Specimen: Nasal Mucosa; Nasal Swab  Result Value Ref Range Status   MRSA, PCR  NEGATIVE NEGATIVE Final   Staphylococcus aureus NEGATIVE NEGATIVE Final    Comment: (NOTE) The Xpert SA Assay (FDA approved for NASAL specimens in patients 47 years of age and older), is one component of a comprehensive surveillance program. It is not intended to diagnose infection nor to guide or monitor treatment. Performed at [facility], [place] [address], [place],  [address]     Studies/Results: CT ABDOMEN PELVIS WO CONTRAST  Result Date: 08/12/2019 CLINICAL DATA:  Nausea and vomiting, history of partial nephrectomy on the right several days ago EXAM: CT ABDOMEN AND PELVIS WITHOUT CONTRAST TECHNIQUE: Multidetector CT imaging of the abdomen and pelvis was performed following the standard protocol without IV contrast. COMPARISON:  05/02/2019 FINDINGS: Lower chest: Lung bases demonstrate mild atelectatic changes. Hepatobiliary: No focal liver abnormality is seen. No gallstones, gallbladder wall thickening, or biliary dilatation. Pancreas: Unremarkable. No pancreatic ductal dilatation or surrounding inflammatory changes. Spleen: Normal in size without focal abnormality. Adrenals/Urinary Tract: Adrenal glands are within normal limits. Left kidney shows small cysts similar to that seen on prior CT examination. The right kidney demonstrates changes of prior partial nephrectomy as well as cyst decortication. Air is noted in the operative bed. No obstructive changes are seen. Bladder partially distended. Stomach/Bowel: Colon is decompressed. The appendix is within normal limits. Multiple dilated loops of small bowel are identified filled with fluid consistent with at least partial small bowel obstruction. This involves the entirety of the jejunum in the majority of the ileum. The distal ileum is within normal limits. The obstructive changes secondary to partial herniation of a small bowel loop via one of the laparoscopic ports best seen on image number 66 of series 2. More distal  small bowel is within normal limits. Vascular/Lymphatic: Aortic atherosclerosis. No enlarged abdominal or pelvic lymph nodes. Reproductive: Status post hysterectomy. No adnexal masses. Other: Considerable subcutaneous air is identified within the anterior abdominal wall related to the recent laparoscopic surgery. No significant free air or free fluid is noted within the abdomen. Musculoskeletal: No acute or significant osseous findings. IMPRESSION: Changes consistent with partial small bowel obstruction secondary to a herniated loop of small bowel in the right anterior abdominal wall laterally through 1 of the laparoscopic ports. Changes consistent with the recent partial nephrectomy on the right and cyst decortication Electronically Signed   By: Inez Catalina M.D.   On: 08/12/2019 12:58   DG Chest Port 1 View  Result Date: 08/12/2019 CLINICAL DATA:  Shortness of breath.  Diaphoresis. EXAM: PORTABLE CHEST 1 VIEW COMPARISON:  One-view chest x-ray 04/02/2018 FINDINGS: The heart size is exaggerated by low lung volumes. Mild bibasilar atelectasis is evident, left greater than right. No significant airspace consolidation present. No effusion or edema is present. Atherosclerotic changes are present the aortic arch. Scarring in the right upper lobe is stable. IMPRESSION: 1. Low lung volumes and mild bibasilar atelectasis, left greater than right. 2. No acute cardiopulmonary disease. 3. Aortic atherosclerosis. Electronically Signed   By: San Morelle M.D.   On: 08/12/2019 11:50   DG Abd Portable 1V  Result Date: 08/12/2019 CLINICAL DATA:  Nasogastric tube  confirmation EXAM: PORTABLE ABDOMEN - 1 VIEW COMPARISON:  CT same day FINDINGS: The tip of the NG tube is seen within the proximal stomach. There is moderately dilated air-filled loops of bowel seen throughout the abdomen. Subcutaneous emphysema seen overlying the left upper abdomen. IMPRESSION: NG tube within the proximal stomach. Dilated loops of small  bowel within the mid abdomen. Electronically Signed   By: Prudencio Pair M.D.   On: 08/12/2019 23:32   DG Abd Portable 1 View  Result Date: 08/12/2019 CLINICAL DATA:  Nasogastric tube placement. EXAM: PORTABLE ABDOMEN - 1 VIEW COMPARISON:  June 01, 2004 FINDINGS: A nasogastric tube is seen with its distal tip noted within the body of the stomach. The bowel gas pattern is normal. No radio-opaque calculi or other significant radiographic abnormality are seen. IMPRESSION: Nasogastric tube positioning, as described above. Electronically Signed   By: Virgina Norfolk M.D.   On: 08/12/2019 18:43    Assessment/Plan: -Incisional hernia-postop day 1 Diagnostic laparoscopy with port site incisional hernia repair 08/12/2019.  -Ileus / SBO - likely resolving. Surgery was late last night. Leave NG one more day.  -seizures- cont IV and po seizure med (clamp NG for one hour after) -po Lamictal 75 mg twice daily and IV Keppra 1.5 g twice daily -tachycardia/blood pressure - improving. Lactate resolved.  -ARF - appreciate nephrology input - cont IVF at 150.  -right lap partial Nx - 08/05/2019    LOS: 1 day   Festus Aloe 08/13/2019, 11:40 AM

## 2019-08-13 NOTE — Consult Note (Signed)
Referring Provider: No ref. provider found Primary Care Physician:  [name], MD Primary Nephrologist:     Reason for Consultation: Acute kidney injury, maintenance of euvolemia, assessment and treatment of electrolyte abnormalities, treatment symptoms of acid-base abnormalities.  HPI: This is a 64 year old lady who is a history of obesity mild mental retardation, hypertension hyperlipidemia and thyroid cancer with a history of seizure disorders.  She is status post laparoscopic robotic assisted partial nephrectomy.  CT scan revealed evidence of port site incisional hernia and decision was made to proceed urgently to the operating room for diagnostic laparoscopy with port site incisional hernia repair.  Appreciate the assistance of Dr. Link Snuffer.  Baseline serum creatinine appears to be about 1.6 mg/dL.  On discharge from the hospital 08/08/2019 creatinine was 1.75.  Blood pressure was 100/62 pulse 79 temperature 97.6 O2 sats 97% room air.  Urine output 125 cc this a.m.  NG output 700 cc.  Home medications benazepril 20 mg daily calcitriol 0.25 mcg daily ezetimibe 10 mg daily Prozac 10 mg daily Synthroid 137 mg daily omeprazole 20 mg daily lovastatin 40 mg daily, amlodipine 5 mg daily Lamictal 75 mg twice daily and Keppra 1.5 g twice daily  Current medications Zetia 10 mg daily Prozac 10 mg daily Lamictal 75 mg twice daily levothyroxine 137 mg daily, Protonix 40 mg daily lovastatin 40 mg daily.  Sodium 137 potassium 3.7 chloride 105 CO2 23 BUN 47 creatinine 5.62 glucose 101 calcium 7.3 hemoglobin 9.3.  IV normal saline 150 cc/h 08/12/2019  Past Medical History:  Diagnosis Date  . Depression   . Dyspnea   . Fractures   . Hyperlipidemia   . Hypertension   . Mental retardation, mild (I.Q. 50-70)    lives alone and cares for self has a nurse aide comes once a day   . Obesity   . Pneumonia   . PONV (postoperative nausea and vomiting)   . Seizure ([place])    11/17/2012 "still having  them; "I believe their from a tumor in my head" ((11/17/2012  . Seizure disorder ([place])   . Thyroid cancer [facility])     Past Surgical History:  Procedure Laterality Date  . ABDOMINAL HYSTERECTOMY  2001  . BREAST LUMPECTOMY  1996   right   . COLONOSCOPY N/A 05/16/2016   Procedure: COLONOSCOPY;  Surgeon: Danie Binder, MD;  Location: [facility];  Service: Endoscopy;  Laterality: N/A;  830   . INGUINAL HERNIA REPAIR N/A 08/12/2019   Procedure: laparoscopic incisional hernia repair;  Surgeon: Lucas Mallow, MD;  Location: [facility];  Service: Urology;  Laterality: N/A;  . ROBOTIC ASSITED PARTIAL NEPHRECTOMY Right 08/05/2019   Procedure: XI ROBOTIC ASSITED PARTIAL NEPHRECTOMY WITH INTRAOPERATIVE ULTRASOUND;  Surgeon: [name], MD;  Location: [facility];  Service: Urology;  Laterality: Right;  3 HRS  . THYROIDECTOMY Bilateral 11/17/2012   Procedure: TOTAL THYROIDECTOMY;  Surgeon: Ascencion Dike, MD;  Location: [place];  Service: ENT;  Laterality: Bilateral;  . TOTAL THYROIDECTOMY Bilateral 11/17/2012  . TUBAL LIGATION      Prior to Admission medications   Medication Sig Start Date End Date Taking? Authorizing Provider  acetaminophen (TYLENOL) 500 MG tablet Take 1 tablet (500 mg total) by mouth 2 (two) times daily. Patient taking differently: Take 500 mg by mouth 2 (two) times daily as needed for mild pain or headache.  08/15/14  Yes [name], MD  amLODipine (NORVASC) 5 MG tablet Take 1 tablet (5 mg total) by  mouth daily. 08/30/18  Yes [name], MD  benazepril (LOTENSIN) 20 MG tablet TAKE 1 TABLET BY MOUTH DAILY. Patient taking differently: Take 20 mg by mouth daily.  05/16/19  Yes Perlie Mayo, NP  calcitRIOL (ROCALTROL) 0.25 MCG capsule Take 0.25 mcg by mouth daily. \   Yes [provider]  ezetimibe (ZETIA) 10 MG tablet Take 1 tablet (10 mg total) by mouth daily. 04/04/19  Yes [name], MD  FLUoxetine (PROZAC) 10 MG capsule Take 1 capsule (10 mg total) by  mouth daily. 04/04/19  Yes [name], MD  LAMICTAL 150 MG tablet Take 75 mg by mouth 2 (two) times daily. 06/17/19  Yes [provider]  levETIRAcetam (KEPPRA) 750 MG tablet Two tablets twice daily Patient taking differently: Take 1,500 mg by mouth 2 (two) times daily.  04/28/17  Yes [name], MD  levothyroxine (SYNTHROID) 137 MCG tablet Take 1 tablet (137 mcg total) by mouth daily before breakfast. 05/09/19  Yes Nida, Marella Chimes, MD  loratadine (CLARITIN) 10 MG tablet TAKE ONE TABLET BY MOUTH ONCE DAILY. Patient taking differently: Take 10 mg by mouth daily.  06/16/19  Yes [name], MD  omeprazole (PRILOSEC) 20 MG capsule TAKE 1 CAPSULE BY MOUTH ONCE DAILY. Patient taking differently: Take 20 mg by mouth daily.  05/16/19  Yes Perlie Mayo, NP  rosuvastatin (CRESTOR) 40 MG tablet TAKE 1 TABLET BY MOUTH ONCE A DAY. Patient taking differently: Take 40 mg by mouth daily.  06/16/19  Yes [name], MD    Current Facility-Administered Medications  Medication Dose Route Frequency Provider Last Rate Last Admin  . 0.9 %  sodium chloride infusion   Intravenous Continuous Marton Redwood III, MD 150 mL/hr at 08/13/19 0533 Rate Verify at 08/13/19 0533  . cefTRIAXone (ROCEPHIN) 2 g in sodium chloride 0.9 % 100 mL IVPB  2 g Intravenous QHS Lucas Mallow, MD   Stopped at 08/12/19 2335  . Chlorhexidine Gluconate Cloth 2 % PADS 6 each  6 each Topical Daily Marton Redwood III, MD   6 each at 08/13/19 0032  . diphenhydrAMINE (BENADRYL) injection 12.5 mg  12.5 mg Intravenous Q6H PRN Marton Redwood III, MD       Or  . diphenhydrAMINE (BENADRYL) 12.5 MG/5ML elixir 12.5 mg  12.5 mg Oral Q6H PRN Marton Redwood III, MD      . ezetimibe (ZETIA) tablet 10 mg  10 mg Oral Daily Marton Redwood III, MD      . FLUoxetine (PROZAC) capsule 10 mg  10 mg Oral Daily Marton Redwood III, MD      . HYDROmorphone (DILAUDID) 1 MG/ML injection           . HYDROmorphone (DILAUDID) 1  MG/ML injection           . HYDROmorphone (DILAUDID) injection 0.5-1 mg  0.5-1 mg Intravenous Q2H PRN Marton Redwood III, MD   1 mg at 08/13/19 0340  . lamoTRIgine (LAMICTAL) tablet 75 mg  75 mg Oral BID Marton Redwood III, MD      . levETIRAcetam (KEPPRA) IVPB 1500 mg/ 100 mL premix  1,500 mg Intravenous Q12H Marton Redwood III, MD      . levothyroxine (SYNTHROID) tablet 137 mcg  137 mcg Oral Q0600 Marton Redwood III, MD      . MEDLINE mouth rinse  15 mL Mouth Rinse BID Lucas Mallow, MD      .  ondansetron (ZOFRAN) injection 4 mg  4 mg Intravenous Q4H PRN Marton Redwood III, MD      . oxyCODONE (Oxy IR/ROXICODONE) immediate release tablet 5 mg  5 mg Oral Q4H PRN Marton Redwood III, MD      . pantoprazole (PROTONIX) injection 40 mg  40 mg Intravenous Q12H Marton Redwood III, MD   40 mg at 08/12/19 2315  . rosuvastatin (CRESTOR) tablet 40 mg  40 mg Oral Daily Marton Redwood III, MD      . zolpidem (AMBIEN) tablet 5 mg  5 mg Oral QHS PRN Lucas Mallow, MD        Allergies as of 08/12/2019 - Review Complete 08/12/2019  Allergen Reaction Noted  . Simvastatin Other (See Comments) 05/23/2015    Family History  Problem Relation Age of Onset  . Diabetes Mother     Social History   Socioeconomic History  . Marital status: Single    Spouse name: Not on file  . Number of children: 2  . Years of education: Not on file  . Highest education level: Not on file  Occupational History  . Occupation: disabled   Tobacco Use  . Smoking status: Never Smoker  . Smokeless tobacco: Never Used  Vaping Use  . Vaping Use: Never used  Substance and Sexual Activity  . Alcohol use: No  . Drug use: No  . Sexual activity: Never  Other Topics Concern  . Not on file  Social History Narrative  . Not on file   Social Determinants of Health   Financial Resource Strain:   . Difficulty of Paying Living Expenses:   Food Insecurity:   . Worried About Charity fundraiser in the Last Year:   . Arts development officer in the Last Year:   Transportation Needs:   . Film/video editor (Medical):   Marland Kitchen Lack of Transportation (Non-Medical):   Physical Activity:   . Days of Exercise per Week:   . Minutes of Exercise per Session:   Stress:   . Feeling of Stress :   Social Connections:   . Frequency of Communication with Friends and Family:   . Frequency of Social Gatherings with Friends and Family:   . Attends Religious Services:   . Active Member of Clubs or Organizations:   . Attends Archivist Meetings:   Marland Kitchen Marital Status:   Intimate Partner Violence:   . Fear of Current or Ex-Partner:   . Emotionally Abused:   Marland Kitchen Physically Abused:   . Sexually Abused:     Review of Systems: Gen: Denies any fever, chills, sweats, anorexia, fatigue, weakness, malaise, weight loss, and sleep disorder HEENT: No visual complaints, No history of Retinopathy. Normal external appearance No Epistaxis or Sore throat. No sinusitis.   CV: Denies chest pain, angina, palpitations, syncope, orthopnea, PND, peripheral edema, and claudication. Resp: Denies dyspnea at rest, dyspnea with exercise, cough, sputum, wheezing, coughing up blood, and pleurisy. GI: Status post partial small bowel obstruction following laparoscopic partial nephrectomy 08/05/2019.   GU : Denies urinary burning, blood in urine, urinary frequency, urinary hesitancy, nocturnal urination, and urinary incontinence.  No renal calculi.  Status post partial resection right renal cell cancer 08/05/2019 MS: Denies joint pain, limitation of movement, and swelling, stiffness, low back pain, extremity pain. Denies muscle weakness, cramps, atrophy.  No use of non steroidal antiinflammatory drugs. Derm: Denies rash, itching, dry skin, hives, moles, warts, or unhealing ulcers.  Psych: Denies  depression, anxiety, memory loss, suicidal ideation, hallucinations, paranoia, and confusion. Heme: Denies bruising, bleeding, and enlarged lymph nodes. Neuro:  Mental retardation seizure disorder currently on antiepileptic medications Endocrine No DM.  No Thyroid disease.  No Adrenal disease.  Physical Exam: Vital signs in last 24 hours: Temp:  [97.4 F (36.3 C)-98.4 F (36.9 C)] 97.6 F (36.4 C) (06/19 0816) Pulse Rate:  [56-147] 90 (06/18 2130) Resp:  [13-38] 13 (06/19 0830) BP: (66-132)/(26-116) 100/62 (06/19 0830) SpO2:  [92 %-100 %] 97 % (06/19 0830) Weight:  [106.1 kg-108.9 kg] 108.9 kg (06/18 2224)   General:   Alert,  Well-developed, well-nourished, pleasant and cooperative in NAD Head:  Normocephalic and atraumatic. Eyes:  Sclera clear, no icterus.   Conjunctiva pink. Ears:  Normal auditory acuity. Nose:  No deformity, discharge,  or lesions. Mouth:  No deformity or lesions, dentition normal. Neck:  Supple; no masses or thyromegaly. JVP not elevated Lungs:  Clear throughout to auscultation.   No wheezes, crackles, or rhonchi. No acute distress. Heart:  Regular rate and rhythm; no murmurs, clicks, rubs,  or gallops. Abdomen:  Soft, nontender and nondistended. No masses, hepatosplenomegaly or hernias noted. Normal bowel sounds, without guarding, and without rebound.   Msk:  Symmetrical without gross deformities. Normal posture. Pulses:  No carotid, renal, femoral bruits. DP and PT symmetrical and equal Extremities:  Without clubbing or edema. Neurologic:  Alert and  oriented x4;  grossly normal neurologically.   Intake/Output from previous day: 06/18 0701 - 06/19 0700 In: 4965.7 [I.V.:3805.7; NG/GT:60; IV Piggyback:1100] Out: 0109 [Urine:375; Emesis/NG output:1100; Blood:20] Intake/Output this shift: No intake/output data recorded.  Lab Results: Recent Labs    08/12/19 1112 08/13/19 0513  WBC 8.3 4.8  HGB 11.6* 9.3*  HCT 36.0 29.7*  PLT 393 272   BMET Recent Labs    08/12/19 1112 08/13/19 0513  NA 133* 136  K 3.7 3.7  CL 94* 105  CO2 18* 23  GLUCOSE 155* 101*  BUN 45* 47*  CREATININE 5.89* 5.62*  CALCIUM  9.2 7.3*   LFT Recent Labs    08/12/19 1112  PROT 8.4*  ALBUMIN 3.3*  AST 121*  ALT 194*  ALKPHOS 102  BILITOT 1.1   PT/INR Recent Labs    08/12/19 1132  LABPROT 15.3*  INR 1.3*   Hepatitis Panel No results for input(s): HEPBSAG, HCVAB, HEPAIGM, HEPBIGM in the last 72 hours.  Studies/Results: CT ABDOMEN PELVIS WO CONTRAST  Result Date: 08/12/2019 CLINICAL DATA:  Nausea and vomiting, history of partial nephrectomy on the right several days ago EXAM: CT ABDOMEN AND PELVIS WITHOUT CONTRAST TECHNIQUE: Multidetector CT imaging of the abdomen and pelvis was performed following the standard protocol without IV contrast. COMPARISON:  05/02/2019 FINDINGS: Lower chest: Lung bases demonstrate mild atelectatic changes. Hepatobiliary: No focal liver abnormality is seen. No gallstones, gallbladder wall thickening, or biliary dilatation. Pancreas: Unremarkable. No pancreatic ductal dilatation or surrounding inflammatory changes. Spleen: Normal in size without focal abnormality. Adrenals/Urinary Tract: Adrenal glands are within normal limits. Left kidney shows small cysts similar to that seen on prior CT examination. The right kidney demonstrates changes of prior partial nephrectomy as well as cyst decortication. Air is noted in the operative bed. No obstructive changes are seen. Bladder partially distended. Stomach/Bowel: Colon is decompressed. The appendix is within normal limits. Multiple dilated loops of small bowel are identified filled with fluid consistent with at least partial small bowel obstruction. This involves the entirety of the jejunum in the majority of the ileum.  The distal ileum is within normal limits. The obstructive changes secondary to partial herniation of a small bowel loop via one of the laparoscopic ports best seen on image number 66 of series 2. More distal small bowel is within normal limits. Vascular/Lymphatic: Aortic atherosclerosis. No enlarged abdominal or pelvic lymph  nodes. Reproductive: Status post hysterectomy. No adnexal masses. Other: Considerable subcutaneous air is identified within the anterior abdominal wall related to the recent laparoscopic surgery. No significant free air or free fluid is noted within the abdomen. Musculoskeletal: No acute or significant osseous findings. IMPRESSION: Changes consistent with partial small bowel obstruction secondary to a herniated loop of small bowel in the right anterior abdominal wall laterally through 1 of the laparoscopic ports. Changes consistent with the recent partial nephrectomy on the right and cyst decortication Electronically Signed   By: Inez Catalina M.D.   On: 08/12/2019 12:58   DG Chest Port 1 View  Result Date: 08/12/2019 CLINICAL DATA:  Shortness of breath.  Diaphoresis. EXAM: PORTABLE CHEST 1 VIEW COMPARISON:  One-view chest x-ray 04/02/2018 FINDINGS: The heart size is exaggerated by low lung volumes. Mild bibasilar atelectasis is evident, left greater than right. No significant airspace consolidation present. No effusion or edema is present. Atherosclerotic changes are present the aortic arch. Scarring in the right upper lobe is stable. IMPRESSION: 1. Low lung volumes and mild bibasilar atelectasis, left greater than right. 2. No acute cardiopulmonary disease. 3. Aortic atherosclerosis. Electronically Signed   By: San Morelle M.D.   On: 08/12/2019 11:50   DG Abd Portable 1V  Result Date: 08/12/2019 CLINICAL DATA:  Nasogastric tube confirmation EXAM: PORTABLE ABDOMEN - 1 VIEW COMPARISON:  CT same day FINDINGS: The tip of the NG tube is seen within the proximal stomach. There is moderately dilated air-filled loops of bowel seen throughout the abdomen. Subcutaneous emphysema seen overlying the left upper abdomen. IMPRESSION: NG tube within the proximal stomach. Dilated loops of small bowel within the mid abdomen. Electronically Signed   By: Prudencio Pair M.D.   On: 08/12/2019 23:32   DG Abd Portable 1  View  Result Date: 08/12/2019 CLINICAL DATA:  Nasogastric tube placement. EXAM: PORTABLE ABDOMEN - 1 VIEW COMPARISON:  June 01, 2004 FINDINGS: A nasogastric tube is seen with its distal tip noted within the body of the stomach. The bowel gas pattern is normal. No radio-opaque calculi or other significant radiographic abnormality are seen. IMPRESSION: Nasogastric tube positioning, as described above. Electronically Signed   By: Virgina Norfolk M.D.   On: 08/12/2019 18:43    Assessment/Plan:  Acute kidney injury.  No evidence of hydronephrosis on CT scan.  Patient underwent partial nephrectomy renal cell cancer right 08/05/2019.  Creatinine appears to be about 1.6 to 1.8 mg/dL at baseline.  Patient was admitted with partial small bowel obstruction 08/12/2019.  Underwent emergency incisional hernia repair.  Creatinine increased 5.4 mg/dL.  Creatinine stable this morning.  Suspect acute tubular necrosis.  Patient was prescribed ACE inhibitor lisinopril and was hypotensive on admission  Hypertension/volume blood pressure appears to be improved I would continue IV fluids at 150 cc/h.  Still having high output from NG tube.  Anemia.  Secondary to blood loss we will continue to follow with urology.  Renal cell cancer status post resection 08/05/2019.  Pathology pending  Seizure disorder continues on Keppra and Lamictal.   LOS: 1 Sherril Croon @TODAY @8 :37 AM

## 2019-08-14 ENCOUNTER — Inpatient Hospital Stay (HOSPITAL_COMMUNITY): Payer: Medicare Other

## 2019-08-14 DIAGNOSIS — E039 Hypothyroidism, unspecified: Secondary | ICD-10-CM

## 2019-08-14 DIAGNOSIS — N1832 Chronic kidney disease, stage 3b: Secondary | ICD-10-CM

## 2019-08-14 DIAGNOSIS — N179 Acute kidney failure, unspecified: Secondary | ICD-10-CM

## 2019-08-14 DIAGNOSIS — I4891 Unspecified atrial fibrillation: Secondary | ICD-10-CM | POA: Diagnosis not present

## 2019-08-14 LAB — BASIC METABOLIC PANEL
Anion gap: 14 (ref 5–15)
BUN: 51 mg/dL — ABNORMAL HIGH (ref 8–23)
CO2: 18 mmol/L — ABNORMAL LOW (ref 22–32)
Calcium: 7.8 mg/dL — ABNORMAL LOW (ref 8.9–10.3)
Chloride: 110 mmol/L (ref 98–111)
Creatinine, Ser: 4.67 mg/dL — ABNORMAL HIGH (ref 0.44–1.00)
GFR calc Af Amer: 11 mL/min — ABNORMAL LOW (ref >60)
GFR calc non Af Amer: 9 mL/min — ABNORMAL LOW (ref >60)
Glucose, Bld: 76 mg/dL (ref 70–99)
Potassium: 3.5 mmol/L (ref 3.5–5.1)
Sodium: 142 mmol/L (ref 135–145)

## 2019-08-14 LAB — CBC
HCT: 27.7 % — ABNORMAL LOW (ref 36.0–46.0)
Hemoglobin: 8.4 g/dL — ABNORMAL LOW (ref 12.0–15.0)
MCH: 26.7 pg (ref 26.0–34.0)
MCHC: 30.3 g/dL (ref 30.0–36.0)
MCV: 87.9 fL (ref 80.0–100.0)
Platelets: 268 10*3/uL (ref 150–400)
RBC: 3.15 MIL/uL — ABNORMAL LOW (ref 3.87–5.11)
RDW: 15.1 % (ref 11.5–15.5)
WBC: 5.3 10*3/uL (ref 4.0–10.5)
nRBC: 0 % (ref 0.0–0.2)

## 2019-08-14 MED ORDER — DILTIAZEM HCL-DEXTROSE 125-5 MG/125ML-% IV SOLN (PREMIX)
5.0000 mg/h | INTRAVENOUS | Status: AC
  Administered 2019-08-14: 5 mg/h via INTRAVENOUS
  Filled 2019-08-14: qty 125

## 2019-08-14 MED ORDER — DILTIAZEM LOAD VIA INFUSION
10.0000 mg | Freq: Once | INTRAVENOUS | Status: AC
  Administered 2019-08-14: 10 mg via INTRAVENOUS
  Filled 2019-08-14: qty 10

## 2019-08-14 MED ORDER — PHENOL 1.4 % MT LIQD
1.0000 | OROMUCOSAL | Status: DC | PRN
  Administered 2019-08-14 – 2019-08-18 (×2): 1 via OROMUCOSAL
  Filled 2019-08-14: qty 177

## 2019-08-14 NOTE — Progress Notes (Signed)
2 Days Post-Op Subjective: Patient reports sore throat from NG tube.  Objective: Vital signs in last 24 hours: Temp:  [97.9 F (36.6 C)-98.9 F (37.2 C)] 98.2 F (36.8 C) (06/20 0815) Pulse Rate:  [89-105] 94 (06/20 0400) Resp:  [14-22] 19 (06/20 0800) BP: (94-166)/(33-69) 166/63 (06/20 0800) SpO2:  [93 %-99 %] 96 % (06/20 0800)  Intake/Output from previous day: 06/19 0701 - 06/20 0700 In: 3660.2 [I.V.:3272.9; NG/GT:100; IV Piggyback:287.4] Out: 1385 [Urine:685; Emesis/NG output:700] Intake/Output this shift: Total I/O In: -  Out: 1200 [Urine:500; Emesis/NG output:700]  Physical Exam:  She looks well.  Alert and oriented.  She definitely looks brighter and more alert today. Cardiovascular-regular rate and rhythm Lungs clear to auscultation bilaterally Abdomen soft and nontender.  Slightly distended.  No rebound or guarding.  Decreased bowel sounds.  Incisions clean dry and intact. Urine clear in bag SCDs in place without calf pain or swelling  Lab Results: Recent Labs    08/12/19 1112 08/13/19 0513 08/14/19 0234  HGB 11.6* 9.3* 8.4*  HCT 36.0 29.7* 27.7*   BMET Recent Labs    08/13/19 0513 08/14/19 0234  NA 136 142  K 3.7 3.5  CL 105 110  CO2 23 18*  GLUCOSE 101* 76  BUN 47* 51*  CREATININE 5.62* 4.67*  CALCIUM 7.3* 7.8*   Recent Labs    08/12/19 1132  INR 1.3*   No results for input(s): LABURIN in the last 72 hours. Results for orders placed or performed during the hospital encounter of 08/12/19  Urine Culture     Status: None   Collection Time: 08/12/19 11:18 AM   Specimen: Urine, Clean Catch  Result Value Ref Range Status   Specimen Description   Final    URINE, CLEAN CATCH Performed at [facility], [address] [place], [place] [address]    Special Requests   Final    NONE Performed at [facility], [address], [place], [place] [address]    Culture   Final    NO GROWTH Performed at [facility], [place] [address]  [place], [place] [address]    Report Status 08/13/2019 FINAL  Final  Blood Culture (routine x 2)     Status: None (Preliminary result)   Collection Time: 08/12/19 11:32 AM   Specimen: BLOOD LEFT ARM  Result Value Ref Range Status   Specimen Description BLOOD LEFT ARM  Final   Special Requests   Final    BOTTLES DRAWN AEROBIC AND ANAEROBIC Blood Culture results may not be optimal due to an excessive volume of blood received in culture bottles   Culture   Final    NO GROWTH < 24 HOURS Performed at [facility], [address] [place], [place] [address]    Report Status PENDING  Incomplete  SARS Coronavirus 2 by RT PCR (hospital order, performed in [place] hospital lab) Nasopharyngeal Nasopharyngeal Swab     Status: None   Collection Time: 08/12/19  1:41 PM   Specimen: Nasopharyngeal Swab  Result Value Ref Range Status   SARS Coronavirus 2 NEGATIVE NEGATIVE Final    Comment: (NOTE) SARS-CoV-2 target nucleic acids are NOT DETECTED.  The SARS-CoV-2 RNA is generally detectable in upper and lower respiratory specimens during the acute phase of infection. The lowest concentration of SARS-CoV-2 viral copies this assay can detect is 250 copies / mL. A negative result does not preclude SARS-CoV-2 infection and should not be used as the sole basis for treatment or other patient  management decisions.  A negative result may occur with improper specimen collection / handling, submission of specimen other than nasopharyngeal swab, presence of viral mutation(s) within the areas targeted by this assay, and inadequate number of viral copies (<250 copies / mL). A negative result must be combined with clinical observations, patient history, and epidemiological information.  Fact Sheet for Patients:   StrictlyIdeas.no  Fact Sheet for Healthcare Providers: BankingDealers.co.za  This test is not yet approved or  cleared by the [place] FDA and has  been authorized for detection and/or diagnosis of SARS-CoV-2 by FDA under an Emergency Use Authorization (EUA).  This EUA will remain in effect (meaning this test can be used) for the duration of the COVID-19 declaration under Section 564(b)(1) of the Act, 21 U.S.C. section 360bbb-3(b)(1), unless the authorization is terminated or revoked sooner.  Performed at [facility], [address], [place], [place] [address]   Surgical PCR screen     Status: None   Collection Time: 08/12/19  7:05 PM   Specimen: Nasal Mucosa; Nasal Swab  Result Value Ref Range Status   MRSA, PCR NEGATIVE NEGATIVE Final   Staphylococcus aureus NEGATIVE NEGATIVE Final    Comment: (NOTE) The Xpert SA Assay (FDA approved for NASAL specimens in patients 69 years of age and older), is one component of a comprehensive surveillance program. It is not intended to diagnose infection nor to guide or monitor treatment. Performed at [facility], [place] [address] [place], [place] [address]     Studies/Results: CT ABDOMEN PELVIS WO CONTRAST  Result Date: 08/12/2019 CLINICAL DATA:  Nausea and vomiting, history of partial nephrectomy on the right several days ago EXAM: CT ABDOMEN AND PELVIS WITHOUT CONTRAST TECHNIQUE: Multidetector CT imaging of the abdomen and pelvis was performed following the standard protocol without IV contrast. COMPARISON:  05/02/2019 FINDINGS: Lower chest: Lung bases demonstrate mild atelectatic changes. Hepatobiliary: No focal liver abnormality is seen. No gallstones, gallbladder wall thickening, or biliary dilatation. Pancreas: Unremarkable. No pancreatic ductal dilatation or surrounding inflammatory changes. Spleen: Normal in size without focal abnormality. Adrenals/Urinary Tract: Adrenal glands are within normal limits. Left kidney shows small cysts similar to that seen on prior CT examination. The right kidney demonstrates changes of prior partial nephrectomy as well as cyst  decortication. Air is noted in the operative bed. No obstructive changes are seen. Bladder partially distended. Stomach/Bowel: Colon is decompressed. The appendix is within normal limits. Multiple dilated loops of small bowel are identified filled with fluid consistent with at least partial small bowel obstruction. This involves the entirety of the jejunum in the majority of the ileum. The distal ileum is within normal limits. The obstructive changes secondary to partial herniation of a small bowel loop via one of the laparoscopic ports best seen on image number 66 of series 2. More distal small bowel is within normal limits. Vascular/Lymphatic: Aortic atherosclerosis. No enlarged abdominal or pelvic lymph nodes. Reproductive: Status post hysterectomy. No adnexal masses. Other: Considerable subcutaneous air is identified within the anterior abdominal wall related to the recent laparoscopic surgery. No significant free air or free fluid is noted within the abdomen. Musculoskeletal: No acute or significant osseous findings. IMPRESSION: Changes consistent with partial small bowel obstruction secondary to a herniated loop of small bowel in the right anterior abdominal wall laterally through 1 of the laparoscopic ports. Changes consistent with the recent partial nephrectomy on the right and cyst decortication Electronically Signed   By: Inez Catalina M.D.   On: 08/12/2019 12:58  DG Abd Portable 1V  Result Date: 08/12/2019 CLINICAL DATA:  Nasogastric tube confirmation EXAM: PORTABLE ABDOMEN - 1 VIEW COMPARISON:  CT same day FINDINGS: The tip of the NG tube is seen within the proximal stomach. There is moderately dilated air-filled loops of bowel seen throughout the abdomen. Subcutaneous emphysema seen overlying the left upper abdomen. IMPRESSION: NG tube within the proximal stomach. Dilated loops of small bowel within the mid abdomen. Electronically Signed   By: Prudencio Pair M.D.   On: 08/12/2019 23:32   DG Abd  Portable 1 View  Result Date: 08/12/2019 CLINICAL DATA:  Nasogastric tube placement. EXAM: PORTABLE ABDOMEN - 1 VIEW COMPARISON:  June 01, 2004 FINDINGS: A nasogastric tube is seen with its distal tip noted within the body of the stomach. The bowel gas pattern is normal. No radio-opaque calculi or other significant radiographic abnormality are seen. IMPRESSION: Nasogastric tube positioning, as described above. Electronically Signed   By: Virgina Norfolk M.D.   On: 08/12/2019 18:43    Assessment/Plan: -Incisional hernia-postop day 2 Diagnostic laparoscopy with port site incisional hernia repair 08/12/2019.  -Ileus / SBO - resolving.  NG output still greater than 500 cc over 24 hours and green.  She denies flatus.  We will continue NG for 1 more day.   Read on portable x-ray pending but appears to continue to show some dilated loops of small bowel. -seizures- cont IV and po seizure med (clamp NG for one hour after) -po Lamictal 75 mg twice daily and IV Keppra 1.5 g twice daily.  She is tolerating NG tube clamping after her medications were given. -tachycardia/low blood pressure -resolved -ARF - appreciate nephrology input - cont IVF at 150.  -right lap partial Nx - 08/05/2019  -PT consult  -sore throat - ice chips (may increase NG outpt). If BS better and flatus returns could likely try NG tube clamp trial.    LOS: 2 days   Festus Aloe 08/14/2019, 11:31 AM

## 2019-08-14 NOTE — Consult Note (Signed)
Medical Consultation   [name]  TAV:[id number]  DOB: [birth year]  DOA: 08/12/2019  PCP: [name], MD   Outpatient Specialists: Dr. Theador Hawthorne (nephrology), Dr. Dorris Fetch (endocrinology), Dr. Leta Baptist (neurology)  Requesting physician: Dr. Claudia Desanctis (urology)   Reason for consultation: New atrial fibrillation with RVR    History of Present Illness: [name] is an 64 y.o. female with history of hypertension, seizure disorder, hyperlipidemia, chronic kidney disease stage IIIb, papillary thyroid carcinoma status post thyroidectomy, and and right renal mass status post laparoscopic partial nephrectomy on 08/05/2019 who returned to the emergency department on 08/12/2019 with 2 to 3 days of nausea and vomiting, was found to have an acute kidney injury and partial SBO secondary to incisional hernia.  Patient was admitted to the urology service and taken to the operating room that same day for port site incisional hernia repair.   This evening, POD #2, patient was noted to have a heart rate in the 130s to 140s and EKG confirmed atrial fibrillation with RVR.  Patient was asymptomatic with this and blood pressure is stable.  Patient denies any known history of atrial fibrillation or heart disease and prior EKGs demonstrate sinus rhythm and tachycardia with PACs.  She does not have an echocardiogram on file.   Review of Systems:  ROS As per HPI otherwise 10 point review of systems negative.   Past Medical History: Past Medical History:  Diagnosis Date  . Depression   . Dyspnea   . Fractures   . Hyperlipidemia   . Hypertension   . Mental retardation, mild (I.Q. 50-70)    lives alone and cares for self has a nurse aide comes once a day   . Obesity   . Pneumonia   . PONV (postoperative nausea and vomiting)   . Seizure ([place])    11/17/2012 "still having them; "I believe their from a tumor in my head" ((11/17/2012  . Seizure disorder ([place])   . Thyroid cancer [facility])      Past Surgical History: Past Surgical History:  Procedure Laterality Date  . ABDOMINAL HYSTERECTOMY  2001  . BREAST LUMPECTOMY  1996   right   . COLONOSCOPY N/A 05/16/2016   Procedure: COLONOSCOPY;  Surgeon: Danie Binder, MD;  [facility];  Service: Endoscopy;  Laterality: N/A;  830   . INGUINAL HERNIA REPAIR N/A 08/12/2019   Procedure: laparoscopic incisional hernia repair;  Surgeon: Lucas Mallow, MD;  Location: [facility];  Service: Urology;  Laterality: N/A;  . ROBOTIC ASSITED PARTIAL NEPHRECTOMY Right 08/05/2019   Procedure: XI ROBOTIC ASSITED PARTIAL NEPHRECTOMY WITH INTRAOPERATIVE ULTRASOUND;  Surgeon: [name], MD;  Location: [facility];  Service: Urology;  Laterality: Right;  3 HRS  . THYROIDECTOMY Bilateral 11/17/2012   Procedure: TOTAL THYROIDECTOMY;  Surgeon: Ascencion Dike, MD;  Location: [facility];  Service: ENT;  Laterality: Bilateral;  . TOTAL THYROIDECTOMY Bilateral 11/17/2012  . TUBAL LIGATION       Allergies:   Allergies  Allergen Reactions  . Simvastatin Other (See Comments)    Muscle aches     Social History:  reports that she has never smoked. She has never used smokeless tobacco. She reports that she does not drink alcohol and does not use drugs.   Family History: Family History  Problem Relation Age of Onset  . Diabetes Mother      Physical Exam: Vitals:   08/14/19  1600 08/14/19 1642 08/14/19 1725 08/14/19 1922  BP: (!) 175/59 (!) 141/54    Pulse:      Resp:  17  17  Temp:   98.4 F (36.9 C)   TempSrc:   Oral   SpO2: 96%     Weight:      Height:        Constitutional: Alert and awake, oriented x3, not in any acute distress. Eyes: PERLA, EOMI, irises appear normal, anicteric sclera,  ENMT: external ears and nose appear normal, lips appears normal, oropharynx mucosa, tongue, posterior pharynx appear normal  Neck: neck appears normal, no masses, normal ROM, no thyromegaly, no JVD  CVS: Rate ~145 and irregularly irregular, trace  LE edema   Respiratory:   no wheezing, rales or rhonchi. Respiratory effort normal. No accessory muscle use.  Abdomen: soft, nontender, nondistended, incisions without drainage or redness   Musculoskeletal: : no cyanosis, clubbing or edema noted bilaterally  Neuro: Cranial nerves II-XII grossly intact, moving all extremities, sensation to light touch intact  Psych: stable mood and affect, pleasant and cooperative Skin: no pallor or cyanosis, no diaphoresis     Data reviewed:  I have personally reviewed following labs and imaging studies Labs:  CBC: Recent Labs  Lab 08/12/19 1112 08/13/19 0513 08/14/19 0234  WBC 8.3 4.8 5.3  NEUTROABS 5.6  --   --   HGB 11.6* 9.3* 8.4*  HCT 36.0 29.7* 27.7*  MCV 84.5 87.6 87.9  PLT 393 272 701    Basic Metabolic Panel: Recent Labs  Lab 08/12/19 1112 08/12/19 1112 08/13/19 0513 08/14/19 0234  NA 133*  --  136 142  K 3.7   < > 3.7 3.5  CL 94*  --  105 110  CO2 18*  --  23 18*  GLUCOSE 155*  --  101* 76  BUN 45*  --  47* 51*  CREATININE 5.89*  --  5.62* 4.67*  CALCIUM 9.2  --  7.3* 7.8*   < > = values in this interval not displayed.   GFR Estimated Creatinine Clearance: 14.8 mL/min (A) (by C-G formula based on SCr of 4.67 mg/dL (H)). Liver Function Tests: Recent Labs  Lab 08/12/19 1112  AST 121*  ALT 194*  ALKPHOS 102  BILITOT 1.1  PROT 8.4*  ALBUMIN 3.3*   No results for input(s): LIPASE, AMYLASE in the last 168 hours. No results for input(s): AMMONIA in the last 168 hours. Coagulation profile Recent Labs  Lab 08/12/19 1132  INR 1.3*    Cardiac Enzymes: No results for input(s): CKTOTAL, CKMB, CKMBINDEX, TROPONINI in the last 168 hours. BNP: Invalid input(s): POCBNP CBG: No results for input(s): GLUCAP in the last 168 hours. D-Dimer No results for input(s): DDIMER in the last 72 hours. Hgb A1c No results for input(s): HGBA1C in the last 72 hours. Lipid Profile No results for input(s): CHOL, HDL, LDLCALC, TRIG,  CHOLHDL, LDLDIRECT in the last 72 hours. Thyroid function studies No results for input(s): TSH, T4TOTAL, T3FREE, THYROIDAB in the last 72 hours.  Invalid input(s): FREET3 Anemia work up No results for input(s): VITAMINB12, FOLATE, FERRITIN, TIBC, IRON, RETICCTPCT in the last 72 hours. Urinalysis    Component Value Date/Time   COLORURINE AMBER (A) 08/12/2019 1130   APPEARANCEUR CLOUDY (A) 08/12/2019 1130   LABSPEC 1.030 08/12/2019 1130   PHURINE 5.0 08/12/2019 1130   GLUCOSEU NEGATIVE 08/12/2019 1130   HGBUR MODERATE (A) 08/12/2019 1130   HGBUR small 04/10/2009 0845   BILIRUBINUR SMALL (A) 08/12/2019  [place] neg 07/13/2019 1512   KETONESUR NEGATIVE 08/12/2019 1130   PROTEINUR >=300 (A) 08/12/2019 1130   UROBILINOGEN 0.2 07/13/2019 1512   UROBILINOGEN 0.2 04/10/2009 0845   NITRITE NEGATIVE 08/12/2019 [place] 08/12/2019 1130     Microbiology Recent Results (from the past 240 hour(s))  Urine Culture     Status: None   Collection Time: 08/12/19 11:18 AM   Specimen: Urine, Clean Catch  Result Value Ref Range Status   Specimen Description   Final    URINE, CLEAN CATCH Performed at [facility], [address] [place], [place] [address]    Special Requests   Final    NONE Performed at [facility], [address] [place], [place] [address]    Culture   Final    NO GROWTH Performed at [facility], [place] [address], [place], [place] [address]    Report Status 08/13/2019 FINAL  Final  Blood Culture (routine x 2)     Status: None (Preliminary result)   Collection Time: 08/12/19 11:32 AM   Specimen: BLOOD LEFT ARM  Result Value Ref Range Status   Specimen Description BLOOD LEFT ARM  Final   Special Requests   Final    BOTTLES DRAWN AEROBIC AND ANAEROBIC Blood Culture results may not be optimal due to an excessive volume of blood received in culture bottles   Culture   Final    NO GROWTH 2 DAYS Performed at [facility], [address] [place], [place] [address]    Report Status PENDING  Incomplete  SARS Coronavirus 2 by RT PCR (hospital order, performed in [place] hospital lab) Nasopharyngeal Nasopharyngeal Swab     Status: None   Collection Time: 08/12/19  1:41 PM   Specimen: Nasopharyngeal Swab  Result Value Ref Range Status   SARS Coronavirus 2 NEGATIVE NEGATIVE Final    Comment: (NOTE) SARS-CoV-2 target nucleic acids are NOT DETECTED.  The SARS-CoV-2 RNA is generally detectable in upper and lower respiratory specimens during the acute phase of infection. The lowest concentration of SARS-CoV-2 viral copies this assay can detect is 250 copies / mL. A negative result does not preclude SARS-CoV-2 infection and should not be used as the sole basis for treatment or other patient management decisions.  A negative result may occur with improper specimen collection / handling, submission of specimen other than nasopharyngeal swab, presence of viral mutation(s) within the areas targeted by this assay, and inadequate number of viral copies (<250 copies / mL). A negative result must be combined with clinical observations, patient history, and epidemiological information.  Fact Sheet for Patients:   StrictlyIdeas.no  Fact Sheet for Healthcare Providers: BankingDealers.co.za  This test is not yet approved or  cleared by the [place] FDA and has been authorized for detection and/or diagnosis of SARS-CoV-2 by FDA under an Emergency Use Authorization (EUA).  This EUA will remain in effect (meaning this test can be used) for the duration of the COVID-19 declaration under Section 564(b)(1) of the Act, 21 U.S.C. section 360bbb-3(b)(1), unless the authorization is terminated or revoked sooner.  Performed at [facility], [address] [place],  [address]   Surgical PCR screen     Status: None   Collection Time: 08/12/19  7:05 PM   Specimen: Nasal Mucosa; Nasal  Swab  Result Value Ref Range Status   MRSA, PCR NEGATIVE NEGATIVE Final   Staphylococcus aureus NEGATIVE NEGATIVE Final    Comment: (NOTE) The  Xpert SA Assay (FDA approved for NASAL specimens in patients 18 years of age and older), is one component of a comprehensive surveillance program. It is not intended to diagnose infection nor to guide or monitor treatment. Performed at [facility], [place] [address], [place], [place] [address]        Inpatient Medications:   Scheduled Meds: . Chlorhexidine Gluconate Cloth  6 each Topical Daily  . diltiazem  10 mg Intravenous Once  . ezetimibe  10 mg Oral Daily  . FLUoxetine  10 mg Per Tube Daily  . lamoTRIgine  75 mg Per Tube BID  . levothyroxine  137 mcg Oral Q0600  . mouth rinse  15 mL Mouth Rinse BID  . pantoprazole (PROTONIX) IV  40 mg Intravenous Q12H  . rosuvastatin  40 mg Oral Daily  . sodium chloride flush  10-40 mL Intracatheter Q12H   Continuous Infusions: . sodium chloride 150 mL/hr at 08/14/19 1650  . cefTRIAXone (ROCEPHIN)  IV Stopped (08/13/19 2155)  . diltiazem (CARDIZEM) infusion    . levETIRAcetam Stopped (08/14/19 1121)     Radiological Exams on Admission: DG Abd Portable 1V  Result Date: 08/14/2019 CLINICAL DATA:  Abdominal distension EXAM: PORTABLE ABDOMEN - 1 VIEW COMPARISON:  08/12/2019 FINDINGS: Enteric tube terminates within the gastric body. Moderately dilated air-filled loops of small bowel are again seen throughout the abdomen, similar in caliber to previous study. Diffuse subcutaneous emphysema of the abdominal wall. IMPRESSION: 1. Moderately dilated air-filled loops of small bowel throughout the abdomen, similar in caliber to previous study. 2. Enteric tube terminates within the gastric body. Electronically Signed   By: Davina Poke D.O.   On: 08/14/2019 12:48   DG Abd Portable 1V  Result Date: 08/12/2019 CLINICAL DATA:  Nasogastric tube confirmation EXAM: PORTABLE ABDOMEN - 1 VIEW  COMPARISON:  CT same day FINDINGS: The tip of the NG tube is seen within the proximal stomach. There is moderately dilated air-filled loops of bowel seen throughout the abdomen. Subcutaneous emphysema seen overlying the left upper abdomen. IMPRESSION: NG tube within the proximal stomach. Dilated loops of small bowel within the mid abdomen. Electronically Signed   By: Prudencio Pair M.D.   On: 08/12/2019 23:32    Impression/Recommendations  1. Atrial fibrillation with RVR  - Patient noted to have HR in 140 range with EKG confirming AF with RVR  - Patient asymptomatic with stable BP, denies hx of heart disease or arrhythmia  - CHADS-VASc is 66 (age, gender, HTN), associated with 3.2% risk of ischemic CVA per year  - Hgb has been trending down from 11.6 preoperatively to 8.4 this am and after discussion with urology will hold off on anticoagulating tonight and reassess in am  - She was given 10 mg IV diltiazem and started on diltiazem infusion  - Check TSH and echocardiogram, continue diltiazem infusion with titration for goal rate of 65-110   2. SBO secondary to incisional hernia status-post repair 08/12/19  - Patient appears well this evening, denies abd pain, has some bilious output from NG, and had plain films today with moderately dilated air-filled loops of SB similar to prior study     3. Acute kidney injury superimposed on CKD stage IIIb  - Nephrology is following and much appreciated, baseline SCr appears to be ~1.6 and was 5.89 on admission in setting of N/V, low BP, and ACE-inhibition  - There was no obstruction on CT obtained  - Appears to be improving with IVF  4. Hypertension  - ACE-i and Norvasc are being held  - SBP 110-175 today  - Monitor BP on diltiazem infusion and if she remains in atrial fib, consider transitioning to oral diltiazem or metoprolol once rate is controlled   5. Hypothyroidism  - Check TSH given new atrial fibrillation, continue Synthroid   6. Seizures  -  Receiving Keppra IV and Lamictal per tube while NPO    Thank you for this consultation.  Our [facility] hospitalist team will follow the patient with you.   Time Spent: 62 minutes.   Ilene Qua Ryatt Corsino M.D. [facility] 08/14/2019, 7:44 PM

## 2019-08-14 NOTE — Progress Notes (Signed)
Called by RN after patient had HR sustained in 130s-140s. ECG confirmed A fib with RVR.  Medicine was consulted and will see her tonight.  Greatly appreciate their assistance.

## 2019-08-14 NOTE — Progress Notes (Signed)
[name]   Subjective:   This is a 64 year old lady who is a history of obesity mild mental retardation, hypertension hyperlipidemia and thyroid cancer with a history of seizure disorders.  She is status post laparoscopic robotic assisted partial nephrectomy.  CT scan revealed evidence of port site incisional hernia and decision was made to proceed urgently to the operating room for diagnostic laparoscopy with port site incisional hernia repair.  Appreciate the assistance of Dr. Link Snuffer.  Baseline serum creatinine appears to be about 1.6 mg/dL.  On discharge from the hospital 08/08/2019 creatinine was 1.75.  Blood pressure 116/56 pulse 92 temperature 98.2 O2 sats 96% room air  Sodium 142 potassium 3.5 chloride 110 CO2 18 BUN 56 creatinine 4.67 glucose 76 calcium 7.8 albumin 3.3 hemoglobin 8.4   Objective:  Vital signs in last 24 hours:  Temp:  [97.9 F (36.6 C)-98.9 F (37.2 C)] 98.2 F (36.8 C) (06/20 0815) Pulse Rate:  [84-105] 94 (06/20 0400) Resp:  [13-22] 16 (06/20 0500) BP: (72-137)/(33-69) 133/53 (06/20 0500) SpO2:  [93 %-100 %] 96 % (06/20 0400)  Weight change:  Filed Weights   08/12/19 1041 08/12/19 2224  Weight: 106.1 kg 108.9 kg    Intake/Output: I/O last 3 completed shifts: In: 7625.9 [I.V.:7078.5; NG/GT:160; IV Piggyback:387.4] Out: 2480 [Urine:1060; Emesis/NG output:1400; Blood:20]   Intake/Output this shift:  No intake/output data recorded.   Alert awake and comfortable CVS- RRR no murmurs rubs or gallops JVP not elevated RS- CTA no wheezes or rales ABD-appropriately tender EXT- no edema   Basic Metabolic Panel: Recent Labs  Lab 08/12/19 1112 08/13/19 0513 08/14/19 0234  NA 133* 136 142  K 3.7 3.7 3.5  CL 94* 105 110  CO2 18* 23 18*  GLUCOSE 155* 101* 76  BUN 45* 47* 51*  CREATININE 5.89* 5.62* 4.67*  CALCIUM 9.2 7.3* 7.8*    Liver Function Tests: Recent Labs  Lab 08/12/19 1112  AST 121*  ALT 194*   ALKPHOS 102  BILITOT 1.1  PROT 8.4*  ALBUMIN 3.3*   No results for input(s): LIPASE, AMYLASE in the last 168 hours. No results for input(s): AMMONIA in the last 168 hours.  CBC: Recent Labs  Lab 08/12/19 1112 08/13/19 0513 08/14/19 0234  WBC 8.3 4.8 5.3  NEUTROABS 5.6  --   --   HGB 11.6* 9.3* 8.4*  HCT 36.0 29.7* 27.7*  MCV 84.5 87.6 87.9  PLT 393 272 268    Cardiac Enzymes: No results for input(s): CKTOTAL, CKMB, CKMBINDEX, TROPONINI in the last 168 hours.  BNP: Invalid input(s): POCBNP  CBG: No results for input(s): GLUCAP in the last 168 hours.  Microbiology: Results for orders placed or performed during the hospital encounter of 08/12/19  Urine Culture     Status: None   Collection Time: 08/12/19 11:18 AM   Specimen: Urine, Clean Catch  Result Value Ref Range Status   Specimen Description   Final    URINE, CLEAN CATCH Performed at [facility], [address] [place], [place] [address]    Special Requests   Final    NONE Performed at [facility], [address] [place], [place] [address]    Culture   Final    NO GROWTH Performed at [facility], [place] [address] [place], [place] [address]    Report Status 08/13/2019 FINAL  Final  Blood Culture (routine x 2)     Status: None (Preliminary result)   Collection Time: 08/12/19 11:32 AM  Specimen: BLOOD LEFT ARM  Result Value Ref Range Status   Specimen Description BLOOD LEFT ARM  Final   Special Requests   Final    BOTTLES DRAWN AEROBIC AND ANAEROBIC Blood Culture results may not be optimal due to an excessive volume of blood received in culture bottles   Culture   Final    NO GROWTH < 24 HOURS Performed at [facility], [address] [place], [place] [address]    Report Status PENDING  Incomplete  SARS Coronavirus 2 by RT PCR (hospital order, performed in [facility] hospital lab) Nasopharyngeal Nasopharyngeal Swab     Status: None   Collection Time: 08/12/19  1:41 PM   Specimen:  Nasopharyngeal Swab  Result Value Ref Range Status   SARS Coronavirus 2 NEGATIVE NEGATIVE Final    Comment: (Huffman) SARS-CoV-2 target nucleic acids are NOT DETECTED.  The SARS-CoV-2 RNA is generally detectable in upper and lower respiratory specimens during the acute phase of infection. The lowest concentration of SARS-CoV-2 viral copies this assay can detect is 250 copies / mL. A negative result does not preclude SARS-CoV-2 infection and should not be used as the sole basis for treatment or other patient management decisions.  A negative result may occur with improper specimen collection / handling, submission of specimen other than nasopharyngeal swab, presence of viral mutation(s) within the areas targeted by this assay, and inadequate number of viral copies (<250 copies / mL). A negative result must be combined with clinical observations, patient history, and epidemiological information.  Fact Sheet for Patients:   StrictlyIdeas.no  Fact Sheet for Healthcare Providers: BankingDealers.co.za  This test is not yet approved or  cleared by the [place] FDA and has been authorized for detection and/or diagnosis of SARS-CoV-2 by FDA under an Emergency Use Authorization (EUA).  This EUA will remain in effect (meaning this test can be used) for the duration of the COVID-19 declaration under Section 564(b)(1) of the Act, 21 U.S.C. section 360bbb-3(b)(1), unless the authorization is terminated or revoked sooner.  Performed at [facility], [address], [place], [place] [address]   Surgical PCR screen     Status: None   Collection Time: 08/12/19  7:05 PM   Specimen: Nasal Mucosa; Nasal Swab  Result Value Ref Range Status   MRSA, PCR NEGATIVE NEGATIVE Final   Staphylococcus aureus NEGATIVE NEGATIVE Final    Comment: (Huffman) The Xpert SA Assay (FDA approved for NASAL specimens in patients 37 years of age and older), is one component  of a comprehensive surveillance program. It is not intended to diagnose infection nor to guide or monitor treatment. Performed at [facility], [place] [address], [place], [place] [address]     Coagulation Studies: Recent Labs    08/12/19 1132  LABPROT 15.3*  INR 1.3*    Urinalysis: Recent Labs    08/12/19 1130  COLORURINE AMBER*  LABSPEC 1.030  PHURINE 5.0  GLUCOSEU NEGATIVE  HGBUR MODERATE*  BILIRUBINUR SMALL*  KETONESUR NEGATIVE  PROTEINUR >=300*  NITRITE NEGATIVE  LEUKOCYTESUR NEGATIVE      Imaging: CT ABDOMEN PELVIS WO CONTRAST  Result Date: 08/12/2019 CLINICAL DATA:  Nausea and vomiting, history of partial nephrectomy on the right several days ago EXAM: CT ABDOMEN AND PELVIS WITHOUT CONTRAST TECHNIQUE: Multidetector CT imaging of the abdomen and pelvis was performed following the standard protocol without IV contrast. COMPARISON:  05/02/2019 FINDINGS: Lower chest: Lung bases demonstrate mild atelectatic changes. Hepatobiliary: No focal liver abnormality is seen. No gallstones,  gallbladder wall thickening, or biliary dilatation. Pancreas: Unremarkable. No pancreatic ductal dilatation or surrounding inflammatory changes. Spleen: Normal in size without focal abnormality. Adrenals/Urinary Tract: Adrenal glands are within normal limits. Left kidney shows small cysts similar to that seen on prior CT examination. The right kidney demonstrates changes of prior partial nephrectomy as well as cyst decortication. Air is noted in the operative bed. No obstructive changes are seen. Bladder partially distended. Stomach/Bowel: Colon is decompressed. The appendix is within normal limits. Multiple dilated loops of small bowel are identified filled with fluid consistent with at least partial small bowel obstruction. This involves the entirety of the jejunum in the majority of the ileum. The distal ileum is within normal limits. The obstructive changes secondary to partial  herniation of a small bowel loop via one of the laparoscopic ports best seen on image number 66 of series 2. More distal small bowel is within normal limits. Vascular/Lymphatic: Aortic atherosclerosis. No enlarged abdominal or pelvic lymph nodes. Reproductive: Status post hysterectomy. No adnexal masses. Other: Considerable subcutaneous air is identified within the anterior abdominal wall related to the recent laparoscopic surgery. No significant free air or free fluid is noted within the abdomen. Musculoskeletal: No acute or significant osseous findings. IMPRESSION: Changes consistent with partial small bowel obstruction secondary to a herniated loop of small bowel in the right anterior abdominal wall laterally through 1 of the laparoscopic ports. Changes consistent with the recent partial nephrectomy on the right and cyst decortication Electronically Signed   By: Inez Catalina M.D.   On: 08/12/2019 12:58   DG Chest Port 1 View  Result Date: 08/12/2019 CLINICAL DATA:  Shortness of breath.  Diaphoresis. EXAM: PORTABLE CHEST 1 VIEW COMPARISON:  One-view chest x-ray 04/02/2018 FINDINGS: The heart size is exaggerated by low lung volumes. Mild bibasilar atelectasis is evident, left greater than right. No significant airspace consolidation present. No effusion or edema is present. Atherosclerotic changes are present the aortic arch. Scarring in the right upper lobe is stable. IMPRESSION: 1. Low lung volumes and mild bibasilar atelectasis, left greater than right. 2. No acute cardiopulmonary disease. 3. Aortic atherosclerosis. Electronically Signed   By: San Morelle M.D.   On: 08/12/2019 11:50   DG Abd Portable 1V  Result Date: 08/12/2019 CLINICAL DATA:  Nasogastric tube confirmation EXAM: PORTABLE ABDOMEN - 1 VIEW COMPARISON:  CT same day FINDINGS: The tip of the NG tube is seen within the proximal stomach. There is moderately dilated air-filled loops of bowel seen throughout the abdomen. Subcutaneous  emphysema seen overlying the left upper abdomen. IMPRESSION: NG tube within the proximal stomach. Dilated loops of small bowel within the mid abdomen. Electronically Signed   By: Prudencio Pair M.D.   On: 08/12/2019 23:32   DG Abd Portable 1 View  Result Date: 08/12/2019 CLINICAL DATA:  Nasogastric tube placement. EXAM: PORTABLE ABDOMEN - 1 VIEW COMPARISON:  June 01, 2004 FINDINGS: A nasogastric tube is seen with its distal tip noted within the body of the stomach. The bowel gas pattern is normal. No radio-opaque calculi or other significant radiographic abnormality are seen. IMPRESSION: Nasogastric tube positioning, as described above. Electronically Signed   By: Virgina Norfolk M.D.   On: 08/12/2019 18:43     Medications:   . sodium chloride 150 mL/hr at 08/14/19 0400  . cefTRIAXone (ROCEPHIN)  IV Stopped (08/13/19 2155)  . levETIRAcetam Stopped (08/13/19 2142)   . Chlorhexidine Gluconate Cloth  6 each Topical Daily  . ezetimibe  10 mg Oral Daily  .  FLUoxetine  10 mg Per Tube Daily  . lamoTRIgine  75 mg Per Tube BID  . levothyroxine  137 mcg Oral Q0600  . mouth rinse  15 mL Mouth Rinse BID  . pantoprazole (PROTONIX) IV  40 mg Intravenous Q12H  . rosuvastatin  40 mg Oral Daily  . sodium chloride flush  10-40 mL Intracatheter Q12H   diphenhydrAMINE **OR** diphenhydrAMINE, HYDROmorphone (DILAUDID) injection, ondansetron, oxyCODONE, sodium chloride flush, zolpidem  Assessment/ Plan:   Acute kidney injury.  No evidence of hydronephrosis on CT scan.  Patient underwent partial nephrectomy renal cell cancer right 08/05/2019.  Creatinine appears to be about 1.6 to 1.8 mg/dL at baseline.  Patient was admitted with partial small bowel obstruction 08/12/2019.  Underwent emergency incisional hernia repair.    Creatinine slightly better.  Suspect acute tubular necrosis.  Patient was prescribed ACE inhibitor lisinopril and was hypotensive on admission.  Urine output 525 cc  08/14/2019.  Hypertension/volume blood pressure appears to be improved I would continue IV fluids at 150 cc/h.  Still having high output from NG tube.  Anemia.  Secondary to blood loss we will continue to follow with urology.  Have noticed slight decrease in hemoglobin from 11.6-8.4 over the 48 hours.  We will need to continue to follow closely.  Renal cell cancer status post resection 08/05/2019.  Pathology pending  Seizure disorder continues on Keppra and Lamictal.    LOS: 2 [name] @TODAY @8 :32 AM

## 2019-08-15 ENCOUNTER — Inpatient Hospital Stay (HOSPITAL_COMMUNITY): Payer: Medicare Other

## 2019-08-15 DIAGNOSIS — I350 Nonrheumatic aortic (valve) stenosis: Secondary | ICD-10-CM | POA: Diagnosis not present

## 2019-08-15 DIAGNOSIS — F78 Other intellectual disabilities: Secondary | ICD-10-CM | POA: Diagnosis not present

## 2019-08-15 DIAGNOSIS — R0781 Pleurodynia: Secondary | ICD-10-CM | POA: Diagnosis not present

## 2019-08-15 DIAGNOSIS — M7989 Other specified soft tissue disorders: Secondary | ICD-10-CM

## 2019-08-15 DIAGNOSIS — I4891 Unspecified atrial fibrillation: Secondary | ICD-10-CM | POA: Diagnosis not present

## 2019-08-15 DIAGNOSIS — E039 Hypothyroidism, unspecified: Secondary | ICD-10-CM

## 2019-08-15 DIAGNOSIS — R569 Unspecified convulsions: Secondary | ICD-10-CM

## 2019-08-15 DIAGNOSIS — I10 Essential (primary) hypertension: Secondary | ICD-10-CM | POA: Diagnosis not present

## 2019-08-15 LAB — GLUCOSE, CAPILLARY
Glucose-Capillary: 68 mg/dL — ABNORMAL LOW (ref 70–99)
Glucose-Capillary: 68 mg/dL — ABNORMAL LOW (ref 70–99)
Glucose-Capillary: 69 mg/dL — ABNORMAL LOW (ref 70–99)
Glucose-Capillary: 87 mg/dL (ref 70–99)
Glucose-Capillary: 99 mg/dL (ref 70–99)

## 2019-08-15 LAB — BASIC METABOLIC PANEL
Anion gap: 11 (ref 5–15)
BUN: 40 mg/dL — ABNORMAL HIGH (ref 8–23)
CO2: 17 mmol/L — ABNORMAL LOW (ref 22–32)
Calcium: 7.9 mg/dL — ABNORMAL LOW (ref 8.9–10.3)
Chloride: 117 mmol/L — ABNORMAL HIGH (ref 98–111)
Creatinine, Ser: 3.52 mg/dL — ABNORMAL HIGH (ref 0.44–1.00)
GFR calc Af Amer: 15 mL/min — ABNORMAL LOW (ref >60)
GFR calc non Af Amer: 13 mL/min — ABNORMAL LOW (ref >60)
Glucose, Bld: 69 mg/dL — ABNORMAL LOW (ref 70–99)
Potassium: 3.6 mmol/L (ref 3.5–5.1)
Sodium: 145 mmol/L (ref 135–145)

## 2019-08-15 LAB — ECHOCARDIOGRAM COMPLETE
Height: 65 in
Weight: 3841.3 oz

## 2019-08-15 LAB — HEMOGLOBIN AND HEMATOCRIT, BLOOD
HCT: 28.8 % — ABNORMAL LOW (ref 36.0–46.0)
Hemoglobin: 9 g/dL — ABNORMAL LOW (ref 12.0–15.0)

## 2019-08-15 LAB — D-DIMER, QUANTITATIVE: D-Dimer, Quant: 7.19 ug/mL-FEU — ABNORMAL HIGH (ref 0.00–0.50)

## 2019-08-15 LAB — MAGNESIUM: Magnesium: 1.7 mg/dL (ref 1.7–2.4)

## 2019-08-15 LAB — TSH: TSH: 0.819 u[IU]/mL (ref 0.350–4.500)

## 2019-08-15 MED ORDER — BISACODYL 10 MG RE SUPP
10.0000 mg | Freq: Once | RECTAL | Status: AC
  Administered 2019-08-15: 10 mg via RECTAL
  Filled 2019-08-15: qty 1

## 2019-08-15 MED ORDER — LABETALOL HCL 5 MG/ML IV SOLN
10.0000 mg | INTRAVENOUS | Status: DC | PRN
  Administered 2019-08-15 – 2019-08-16 (×2): 10 mg via INTRAVENOUS
  Filled 2019-08-15: qty 4

## 2019-08-15 MED ORDER — DILTIAZEM HCL 60 MG PO TABS
60.0000 mg | ORAL_TABLET | Freq: Four times a day (QID) | ORAL | Status: DC
  Administered 2019-08-15 – 2019-08-16 (×4): 60 mg via ORAL
  Filled 2019-08-15 (×4): qty 1

## 2019-08-15 MED ORDER — METOCLOPRAMIDE HCL 5 MG/ML IJ SOLN
5.0000 mg | Freq: Three times a day (TID) | INTRAMUSCULAR | Status: DC
  Administered 2019-08-15 – 2019-08-24 (×29): 5 mg via INTRAVENOUS
  Filled 2019-08-15 (×30): qty 2

## 2019-08-15 MED ORDER — DEXTROSE-NACL 5-0.9 % IV SOLN: INTRAVENOUS | Status: DC

## 2019-08-15 MED ORDER — TECHNETIUM TO 99M ALBUMIN AGGREGATED
4.0000 | Freq: Once | INTRAVENOUS | Status: AC | PRN
  Administered 2019-08-15: 4 via INTRAVENOUS

## 2019-08-15 NOTE — Progress Notes (Signed)
[facility] PROGRESS NOTE   [name] KGM:[id number] DOB: 05/27/1955 DOA: 08/12/2019  PCP: [name], MD  Brief History/Interval Summary: 64 y.o. female with history of hypertension, seizure disorder, hyperlipidemia, chronic kidney disease stage IIIb, papillary thyroid carcinoma status post thyroidectomy, and and right renal mass status post laparoscopic partial nephrectomy on 08/05/2019 who returned to the emergency department on 08/12/2019 with 2 to 3 days of nausea and vomiting, was found to have an acute kidney injury and partial SBO secondary to incisional hernia.  Patient was admitted to the urology service and taken to the operating room that same day for port site incisional hernia repair.  On the evening of 6/28 patient was found to have heart rate in the 1 30-1 40 range.  EKG showed atrial fibrillation with RVR.  Patient was transferred to the ICU.  Hospitalist was consulted.   Reason for Visit: Atrial fibrillation with RVR  Procedures:  Transthoracic echocardiogram is pending  Antibiotics: Anti-infectives (From admission, onward)   Start     Dose/Rate Route Frequency Ordered Stop   08/12/19 2300  cefTRIAXone (ROCEPHIN) 2 g in sodium chloride 0.9 % 100 mL IVPB     Discontinue     2 g 200 mL/hr over 30 Minutes Intravenous Daily at bedtime 08/12/19 2226     08/12/19 1145  cefTRIAXone (ROCEPHIN) 1 g in sodium chloride 0.9 % 100 mL IVPB        1 g 200 mL/hr over 30 Minutes Intravenous  Once 08/12/19 1140 08/12/19 1320      Subjective/Interval History: Patient complains of pain in her throat area from her NG tube as well as ET tube and it was placed for her surgery.  Denies any shortness of breath.  States that her chest hurts sometimes when she takes a deep breath.  ROS: Denies any nausea or vomiting.  Continues to have some abdominal pain.    Assessment/Plan:  Atrial fibrillation with RVR Telemetry this morning shows sinus rhythm.  Will do an EKG to  confirm.  Echocardiogram is pending.  TSH was normal.  Patient is currently on a diltiazem infusion which will be transitioned to oral.  Apparently she has been able to take oral medicine even though she has an NG tube.  The NG tube is clamped for absorption.  Magnesium is 1.7.  Potassium 3.6. This is a new diagnosis for her.  We will also need to consider anticoagulation but since this was a brief episode we can hold off for now till her other acute issues subside.   Patient complains of some pleuritic chest pain.  Etiology is unclear.  Chest x-ray only shows atelectasis and low lung volumes.  She is not hypoxic.  She did have atrial fibrillation with RVR.  Will order D-dimer.    Proceed with lower extremity Doppler studies.  Acute kidney injury superimposed on chronic kidney disease stage IIIb Nephrology is following.  Monitor urine output.  Renal function has been slowly improving.  Small bowel obstruction secondary to incisional hernia status post repair on 08/12/2019 Management deferred to the primary team.  Essential hypertension Her antihypertensives including ACE inhibitor and Norvasc are being held.  Monitor blood pressures closely.  She will be on Cardizem orally for A. fib as mentioned above.  Hypothyroidism TSH was normal.  She is on levothyroxine.  History of seizure disorder Receiving intravenous Keppra and also on Lamictal.  No seizure activity here in the hospital.  Morbid obesity Estimated body mass index is  39.95 kg/m as calculated from the following:   Height as of this encounter: 5\' 5"  (1.651 m).   Weight as of this encounter: 108.9 kg.   DVT Prophylaxis: On SCDs Code Status: Full code   Medications:  Scheduled: . Chlorhexidine Gluconate Cloth  6 each Topical Daily  . diltiazem  60 mg Oral Q6H  . ezetimibe  10 mg Oral Daily  . FLUoxetine  10 mg Per Tube Daily  . lamoTRIgine  75 mg Per Tube BID  . levothyroxine  137 mcg Oral Q0600  . mouth rinse  15 mL Mouth  Rinse BID  . metoCLOPramide (REGLAN) injection  5 mg Intravenous Q8H  . pantoprazole (PROTONIX) IV  40 mg Intravenous Q12H  . rosuvastatin  40 mg Oral Daily  . sodium chloride flush  10-40 mL Intracatheter Q12H   Continuous: . sodium chloride 75 mL/hr at 08/15/19 0408  . cefTRIAXone (ROCEPHIN)  IV Stopped (08/14/19 2218)  . diltiazem (CARDIZEM) infusion 7.5 mg/hr (08/14/19 2030)  . levETIRAcetam 1,500 mg (08/15/19 1029)   KXF:[id number] **OR** diphenhydrAMINE, HYDROmorphone (DILAUDID) injection, ondansetron, oxyCODONE, phenol, sodium chloride flush, zolpidem   Objective:  Vital Signs  Vitals:   08/15/19 0045 08/15/19 0400 08/15/19 0700 08/15/19 0800  BP:  (!) 136/51 (!) 146/52 (!) 124/36  Pulse:  80    Resp: 12 14 15 16   Temp:  98.8 F (37.1 C)  98.3 F (36.8 C)  TempSrc:  Axillary  Axillary  SpO2:      Weight:      Height:        Intake/Output Summary (Last 24 hours) at 08/15/2019 1037 Last data filed at 08/15/2019 0951 Gross per 24 hour  Intake 3220.6 ml  Output 3100 ml  Net 120.6 ml   Filed Weights   08/12/19 1041 08/12/19 2224  Weight: 106.1 kg 108.9 kg    General appearance: Awake alert.  In no distress.  Obese Resp: Normal effort at rest.  Diminished air entry at the bases without any crackles or wheezing.   Cardio: S1-S2 is normal regular.  No S3-S4.  No rubs murmurs or bruit GI: Abdomen is soft.  Nontender nondistended.  Bowel sounds are present normal.  No masses organomegaly Extremities: No edema.  Full range of motion of lower extremities. Neurologic: Alert and oriented x3.  No focal neurological deficits.    Lab Results:  Data Reviewed: I have personally reviewed following labs and imaging studies  CBC: Recent Labs  Lab 08/12/19 1112 08/13/19 0513 08/14/19 0234 08/15/19 0821  WBC 8.3 4.8 5.3  --   NEUTROABS 5.6  --   --   --   HGB 11.6* 9.3* 8.4* 9.0*  HCT 36.0 29.7* 27.7* 28.8*  MCV 84.5 87.6 87.9  --   PLT 393 272 268  --      Basic Metabolic Panel: Recent Labs  Lab 08/12/19 1112 08/13/19 0513 08/14/19 0234 08/15/19 0030 08/15/19 0821  NA 133* 136 142 145  --   K 3.7 3.7 3.5 3.6  --   CL 94* 105 110 117*  --   CO2 18* 23 18* 17*  --   GLUCOSE 155* 101* 76 69*  --   BUN 45* 47* 51* 40*  --   CREATININE 5.89* 5.62* 4.67* 3.52*  --   CALCIUM 9.2 7.3* 7.8* 7.9*  --   MG  --   --   --   --  1.7    GFR: Estimated Creatinine Clearance: 19.6 mL/min (A) (by C-G  formula based on SCr of 3.52 mg/dL (H)).  Liver Function Tests: Recent Labs  Lab 08/12/19 1112  AST 121*  ALT 194*  ALKPHOS 102  BILITOT 1.1  PROT 8.4*  ALBUMIN 3.3*    Coagulation Profile: Recent Labs  Lab 08/12/19 1132  INR 1.3*    CBG: Recent Labs  Lab 08/12/19 1851 08/15/19 0743  GLUCAP 69* 68*     Thyroid Function Tests: Recent Labs    08/15/19 0821  TSH 0.819      Recent Results (from the past 240 hour(s))  Urine Culture     Status: None   Collection Time: 08/12/19 11:18 AM   Specimen: Urine, Clean Catch  Result Value Ref Range Status   Specimen Description   Final    URINE, CLEAN CATCH Performed at [facility], [address] [place], [place] [address]    Special Requests   Final    NONE Performed at [facility], [address] [place], [place] [address]    Culture   Final    NO GROWTH Performed at [facility], [place] [address], [place], [place] [address]    Report Status 08/13/2019 FINAL  Final  Blood Culture (routine x 2)     Status: None (Preliminary result)   Collection Time: 08/12/19 11:32 AM   Specimen: BLOOD LEFT ARM  Result Value Ref Range Status   Specimen Description BLOOD LEFT ARM  Final   Special Requests   Final    BOTTLES DRAWN AEROBIC AND ANAEROBIC Blood Culture results may not be optimal due to an excessive volume of blood received in culture bottles   Culture   Final    NO GROWTH 3 DAYS Performed at [facility], [address], [place], [place] [address]    Report  Status PENDING  Incomplete  SARS Coronavirus 2 by RT PCR (hospital order, performed in [place] hospital lab) Nasopharyngeal Nasopharyngeal Swab     Status: None   Collection Time: 08/12/19  1:41 PM   Specimen: Nasopharyngeal Swab  Result Value Ref Range Status   SARS Coronavirus 2 NEGATIVE NEGATIVE Final    Comment: (NOTE) SARS-CoV-2 target nucleic acids are NOT DETECTED.  The SARS-CoV-2 RNA is generally detectable in upper and lower respiratory specimens during the acute phase of infection. The lowest concentration of SARS-CoV-2 viral copies this assay can detect is 250 copies / mL. A negative result does not preclude SARS-CoV-2 infection and should not be used as the sole basis for treatment or other patient management decisions.  A negative result may occur with improper specimen collection / handling, submission of specimen other than nasopharyngeal swab, presence of viral mutation(s) within the areas targeted by this assay, and inadequate number of viral copies (<250 copies / mL). A negative result must be combined with clinical observations, patient history, and epidemiological information.  Fact Sheet for Patients:   StrictlyIdeas.no  Fact Sheet for Healthcare Providers: BankingDealers.co.za  This test is not yet approved or  cleared by the [place] FDA and has been authorized for detection and/or diagnosis of SARS-CoV-2 by FDA under an Emergency Use Authorization (EUA).  This EUA will remain in effect (meaning this test can be used) for the duration of the COVID-19 declaration under Section 564(b)(1) of the Act, 21 U.S.C. section 360bbb-3(b)(1), unless the authorization is terminated or revoked sooner.  Performed at [facility], [address], [place], [place] [address]   Surgical PCR screen     Status: None   Collection  Time: 08/12/19  7:05 PM   Specimen: Nasal Mucosa; Nasal Swab  Result Value Ref Range Status    MRSA, PCR NEGATIVE NEGATIVE Final   Staphylococcus aureus NEGATIVE NEGATIVE Final    Comment: (NOTE) The Xpert SA Assay (FDA approved for NASAL specimens in patients 72 years of age and older), is one component of a comprehensive surveillance program. It is not intended to diagnose infection nor to guide or monitor treatment. Performed at [facility], [place] [address] [place], [place] [address]       Radiology Studies: DG Chest 1 View  Result Date: 08/15/2019 CLINICAL DATA:  Atrial fibrillation EXAM: CHEST  1 VIEW COMPARISON:  One-view chest x-ray 08/12/2019. FINDINGS: Heart is mildly enlarged, exaggerated by low lung volumes. NG tube courses off the inferior border the film. Right IJ line is stable. Linear atelectasis is again noted at the left base. Scarring is present in the right upper lobe. IMPRESSION: 1. Low lung volumes. 2. Stable linear atelectasis at the left base. Electronically Signed   By: San Morelle M.D.   On: 08/15/2019 08:14   DG Abd Portable 1V  Result Date: 08/14/2019 CLINICAL DATA:  Abdominal distension EXAM: PORTABLE ABDOMEN - 1 VIEW COMPARISON:  08/12/2019 FINDINGS: Enteric tube terminates within the gastric body. Moderately dilated air-filled loops of small bowel are again seen throughout the abdomen, similar in caliber to previous study. Diffuse subcutaneous emphysema of the abdominal wall. IMPRESSION: 1. Moderately dilated air-filled loops of small bowel throughout the abdomen, similar in caliber to previous study. 2. Enteric tube terminates within the gastric body. Electronically Signed   By: Davina Poke D.O.   On: 08/14/2019 12:48       LOS: 3 days   [facility] Pager on [email or web address]  08/15/2019, 10:37 AM

## 2019-08-15 NOTE — Progress Notes (Signed)
3 Days Post-Op   Subjective/Chief Complaint:  1 - Small Bowel Obstruction / Incisional Hernia - s/p emergent laparosopic hernia reduction / repair 08/12/19, day of admission, for high grade SBO. NPO with NGT post-op. Regaln started 6/21.  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 Ipsilateral cyst decortication benign.   3 - Acute on Chronic Renal Failure - Cr 6's with poor PO intake and ACEI on admit 6/18. CT no hydro / fluid collections. Baseline Cr 1.6's.   4 - Disposition / Rehab - independent at baseline, daughter [name] helps with errands.   Today "[name]"    Objective: Vital signs in last 24 hours: Temp:  [98.3 F (36.8 C)-98.8 F (37.1 C)] 98.4 F (36.9 C) (06/21 1200) Pulse Rate:  [70-91] 70 (06/21 1536) Resp:  [12-20] 19 (06/21 1536) BP: (91-198)/(36-106) 131/60 (06/21 1536) SpO2:  [92 %-99 %] 99 % (06/21 1536) Last BM Date:  (PTA)  Intake/Output from previous day: 06/20 0701 - 06/21 0700 In: 3120.6 [I.V.:2990.6; NG/GT:30; IV Piggyback:100] Out: 2600 [Urine:2400; Emesis/NG output:200] Intake/Output this shift: Total I/O In: 100 [NG/GT:100] Out: 1250 [Urine:650; Emesis/NG output:600]  General appearance: alert, cooperative and in better spirits today.  Eyes: negative Nose: Nares normal. Septum midline. Mucosa normal. No drainage or sinus tenderness., NGT in place with thinner bilious output.  Throat: lips, mucosa, and tongue normal; teeth and gums normal Neck: thyroid not enlarged, symmetric, no tenderness/mass/nodules and EJ IV in place w/o site reaction.  Back: symmetric, no curvature. ROM normal. No CVA tenderness. Resp: non-labored on [place] O2.  Cardio: sinus with HR 80s by monitor.  GI: stable obesity and some distention w/o r/g. Recent port sites c/d/i.  Pelvic: external genitalia normal and foley in place with non-foul urine.  Extremities: extremities normal,  atraumatic, no cyanosis or edema Lymph nodes: Cervical, supraclavicular, and axillary nodes normal. Neurologic: Grossly normal Incision/Wound: as per above.   Lab Results:  Recent Labs    08/13/19 0513 08/13/19 0513 08/14/19 0234 08/15/19 0821  WBC 4.8  --  5.3  --   HGB 9.3*   < > 8.4* 9.0*  HCT 29.7*   < > 27.7* 28.8*  PLT 272  --  268  --    < > = values in this interval not displayed.   BMET Recent Labs    08/14/19 0234 08/15/19 0030  NA 142 145  K 3.5 3.6  CL 110 117*  CO2 18* 17*  GLUCOSE 76 69*  BUN 51* 40*  CREATININE 4.67* 3.52*  CALCIUM 7.8* 7.9*   PT/INR No results for input(s): LABPROT, INR in the last 72 hours. ABG No results for input(s): PHART, HCO3 in the last 72 hours.  Invalid input(s): PCO2, PO2  Studies/Results: DG Chest 1 View  Result Date: 08/15/2019 CLINICAL DATA:  Atrial fibrillation EXAM: CHEST  1 VIEW COMPARISON:  One-view chest x-ray 08/12/2019. FINDINGS: Heart is mildly enlarged, exaggerated by low lung volumes. NG tube courses off the inferior border the film. Right IJ line is stable. Linear atelectasis is again noted at the left base. Scarring is present in the right upper lobe. IMPRESSION: 1. Low lung volumes. 2. Stable linear atelectasis at the left base. Electronically Signed   By: San Morelle M.D.   On: 08/15/2019 08:14   DG Abd Portable 1V  Result Date: 08/14/2019 CLINICAL DATA:  Abdominal distension EXAM: PORTABLE ABDOMEN - 1 VIEW COMPARISON:  08/12/2019  FINDINGS: Enteric tube terminates within the gastric body. Moderately dilated air-filled loops of small bowel are again seen throughout the abdomen, similar in caliber to previous study. Diffuse subcutaneous emphysema of the abdominal wall. IMPRESSION: 1. Moderately dilated air-filled loops of small bowel throughout the abdomen, similar in caliber to previous study. 2. Enteric tube terminates within the gastric body. Electronically Signed   By: Davina Poke D.O.   On:  08/14/2019 12:48   ECHOCARDIOGRAM COMPLETE  Result Date: 08/15/2019    ECHOCARDIOGRAM REPORT   Patient Name:   [name] [facility] Date of Exam: 08/15/2019 Medical Rec #:  [id number]       Height:       65.0 in Accession #:    [id number]      Weight:       240.1 lb Date of Birth:  [birth year]       BSA:          2.138 m Patient Age:    64 years        BP:           124/36 mmHg Patient Gender: F               HR:           94 bpm. Exam Location:  Inpatient Procedure: 2D Echo, Cardiac Doppler and Color Doppler Indications:    I48.0 Paroxysmal atrial fibrillation  History:        Patient has no prior history of Echocardiogram examinations.                 Signs/Symptoms:Dyspnea; Risk Factors:Hypertension and                 Dyslipidemia. Thyroid Cancer. Seizures.  Sonographer:    Jonelle Sidle Dance Referring Phys: [id number] [place]  1. Hyperdynamic LV systolic function; elevated mean gradient across aortic valve (19 mmHg) suggests mild AS; however hyperdynamic LV function at least partially contributing; trace AI; mild TR with severely elevated pulmonary pressure.  2. Left ventricular ejection fraction, by estimation, is >75%. The left ventricle has hyperdynamic function. The left ventricle has no regional wall motion abnormalities. Left ventricular diastolic parameters were normal.  3. Right ventricular systolic function is normal. The right ventricular size is normal. There is severely elevated pulmonary artery systolic pressure.  4. The mitral valve is normal in structure. Trivial mitral valve regurgitation. No evidence of mitral stenosis.  5. The aortic valve is tricuspid. Aortic valve regurgitation is trivial. Mild aortic valve stenosis.  6. The inferior vena cava is normal in size with greater than 50% respiratory variability, suggesting right atrial pressure of 3 mmHg. FINDINGS  Left Ventricle: Left ventricular ejection fraction, by estimation, is >75%. The left ventricle has hyperdynamic function. The  left ventricle has no regional wall motion abnormalities. The left ventricular internal cavity size was normal in size. There is no left ventricular hypertrophy. Left ventricular diastolic parameters were normal. Right Ventricle: The right ventricular size is normal. Right ventricular systolic function is normal. There is severely elevated pulmonary artery systolic pressure. The tricuspid regurgitant velocity is 3.89 m/s, and with an assumed right atrial pressure  of 3 mmHg, the estimated right ventricular systolic pressure is 30.8 mmHg. Left Atrium: Left atrial size was normal in size. Right Atrium: Right atrial size was normal in size. Pericardium: There is no evidence of pericardial effusion. Mitral Valve: The mitral valve is normal in structure. Normal mobility of the mitral valve leaflets. Trivial mitral valve  regurgitation. No evidence of mitral valve stenosis. Tricuspid Valve: The tricuspid valve is normal in structure. Tricuspid valve regurgitation is mild . No evidence of tricuspid stenosis. Aortic Valve: The aortic valve is tricuspid. Aortic valve regurgitation is trivial. Mild aortic stenosis is present. Aortic valve mean gradient measures 16.0 mmHg. Aortic valve peak gradient measures 32.6 mmHg. Aortic valve area, by VTI measures 1.84 cm. Pulmonic Valve: The pulmonic valve was not well visualized. Pulmonic valve regurgitation is not visualized. No evidence of pulmonic stenosis. Aorta: The aortic root is normal in size and structure. Venous: The inferior vena cava is normal in size with greater than 50% respiratory variability, suggesting right atrial pressure of 3 mmHg. IAS/Shunts: No atrial level shunt detected by color flow Doppler. Additional Comments: Hyperdynamic LV systolic function; elevated mean gradient across aortic valve (19 mmHg) suggests mild AS; however hyperdynamic LV function at least partially contributing; trace AI; mild TR with severely elevated pulmonary pressure.  LEFT VENTRICLE  PLAX 2D LVIDd:         3.70 cm  Diastology LVIDs:         2.20 cm  LV e' lateral:   14.10 cm/s LV PW:         1.10 cm  LV E/e' lateral: 5.6 LV IVS:        0.70 cm  LV e' medial:    10.00 cm/s LVOT diam:     1.80 cm  LV E/e' medial:  7.8 LV SV:         92 LV SV Index:   43 LVOT Area:     2.54 cm  RIGHT VENTRICLE             IVC RV Basal diam:  2.80 cm     IVC diam: 1.50 cm RV S prime:     23.30 cm/s TAPSE (M-mode): 2.0 cm LEFT ATRIUM             Index       RIGHT ATRIUM           Index LA diam:        2.70 cm 1.26 cm/m  RA Area:     12.00 cm LA Vol (A2C):   55.4 ml 25.91 ml/m RA Volume:   25.50 ml  11.93 ml/m LA Vol (A4C):   56.5 ml 26.43 ml/m LA Biplane Vol: 59.3 ml 27.74 ml/m  AORTIC VALVE AV Area (Vmax):    1.65 cm AV Area (Vmean):   1.68 cm AV Area (VTI):     1.84 cm AV Vmax:           285.33 cm/s AV Vmean:          185.000 cm/s AV VTI:            0.500 m AV Peak Grad:      32.6 mmHg AV Mean Grad:      16.0 mmHg LVOT Vmax:         185.00 cm/s LVOT Vmean:        122.000 cm/s LVOT VTI:          0.361 m LVOT/AV VTI ratio: 0.72  AORTA Ao Root diam: 3.00 cm Ao Asc diam:  3.40 cm MITRAL VALVE               TRICUSPID VALVE MV Area (PHT): 3.17 cm    TR Peak grad:   60.5 mmHg MV Decel Time: 239 msec    TR Vmax:  389.00 cm/s MV E velocity: 78.50 cm/s MV A velocity: 92.70 cm/s  SHUNTS MV E/A ratio:  0.85        Systemic VTI:  0.36 m                            Systemic Diam: 1.80 cm Kirk Ruths MD Electronically signed by Kirk Ruths MD Signature Date/Time: 08/15/2019/12:23:06 PM    Final    VAS [place] LOWER EXTREMITY VENOUS (DVT)  Result Date: 08/15/2019  Lower Venous DVTStudy Indications: Swelling, and Edema.  Limitations: Body habitus and poor ultrasound/tissue interface. Comparison Study: no prior Performing Technologist: Abram Sander RVS  Examination Guidelines: A complete evaluation includes B-mode imaging, spectral Doppler, color Doppler, and power Doppler as needed of all accessible portions of  each vessel. Bilateral testing is considered an integral part of a complete examination. Limited examinations for reoccurring indications may be performed as noted. The reflux portion of the exam is performed with the patient in reverse Trendelenburg.  +---------+---------------+---------+-----------+----------+--------------+ RIGHT    CompressibilityPhasicitySpontaneityPropertiesThrombus Aging +---------+---------------+---------+-----------+----------+--------------+ CFV      Full           Yes      Yes                                 +---------+---------------+---------+-----------+----------+--------------+ SFJ      Full                                                        +---------+---------------+---------+-----------+----------+--------------+ FV Prox  Full                                                        +---------+---------------+---------+-----------+----------+--------------+ FV Mid                  Yes      Yes                                 +---------+---------------+---------+-----------+----------+--------------+ FV Distal                                             Not visualized +---------+---------------+---------+-----------+----------+--------------+ PFV      Full                                                        +---------+---------------+---------+-----------+----------+--------------+ POP      Full           Yes      Yes                                 +---------+---------------+---------+-----------+----------+--------------+ PTV  Full                                                        +---------+---------------+---------+-----------+----------+--------------+ PERO                                                  Not visualized +---------+---------------+---------+-----------+----------+--------------+   +---------+---------------+---------+-----------+----------+--------------+ LEFT      CompressibilityPhasicitySpontaneityPropertiesThrombus Aging +---------+---------------+---------+-----------+----------+--------------+ CFV      Full           Yes      Yes                                 +---------+---------------+---------+-----------+----------+--------------+ SFJ      Full                                                        +---------+---------------+---------+-----------+----------+--------------+ FV Prox  Full                                                        +---------+---------------+---------+-----------+----------+--------------+ FV Mid   Full                                                        +---------+---------------+---------+-----------+----------+--------------+ FV Distal                                             Not visualized +---------+---------------+---------+-----------+----------+--------------+ PFV      Full                                                        +---------+---------------+---------+-----------+----------+--------------+ POP      Full           Yes      Yes                                 +---------+---------------+---------+-----------+----------+--------------+ PTV      Full                                                        +---------+---------------+---------+-----------+----------+--------------+ PERO  Not visualized +---------+---------------+---------+-----------+----------+--------------+     Summary: BILATERAL: - No evidence of deep vein thrombosis seen in the lower extremities, bilaterally. - RIGHT: - A cystic structure is found in the popliteal fossa.  LEFT: - No cystic structure found in the popliteal fossa.  *See table(s) above for measurements and observations. Electronically signed by Ruta Hinds MD on 08/15/2019 at 3:36:46 PM.    Final     Anti-infectives: Anti-infectives (From admission, onward)   Start      Dose/Rate Route Frequency Ordered Stop   08/12/19 2300  cefTRIAXone (ROCEPHIN) 2 g in sodium chloride 0.9 % 100 mL IVPB     Discontinue     2 g 200 mL/hr over 30 Minutes Intravenous Daily at bedtime 08/12/19 2226     08/12/19 1145  cefTRIAXone (ROCEPHIN) 1 g in sodium chloride 0.9 % 100 mL IVPB        1 g 200 mL/hr over 30 Minutes Intravenous  Once 08/12/19 1140 08/12/19 1320      Assessment/Plan:  1 - Small Bowel Obstruction / Incisional Hernia - doing satisfactory POD 3. Will try for NGT clamp tomorrow.  Continue reglan for now.   2 - Right Renal Cancer  - good prognosis. NO further cancer directed therapy this admission.   3 - Acute on Chronic Renal Failure - likely severe pre-renal. Improving with hydraiton. Appreciate nephrol co-managent.   4 - Disposition / Rehab - PT eval for help on DC  / DME planning.   [name] 08/15/2019

## 2019-08-15 NOTE — Progress Notes (Signed)
  Echocardiogram 2D Echocardiogram has been performed.  [name] 08/15/2019, 9:14 AM

## 2019-08-15 NOTE — TOC Initial Note (Signed)
Transition of Care [facility]) - Initial/Assessment Note    Patient Details  Name: [name] MRN: [id number] Date of Birth: [birth year]  Transition of Care [facility]) CM/SW Contact:    Leeroy Cha, RN Phone Number: 08/15/2019, 9:09 AM  Clinical Narrative:                 Remains in a.fib and on IV Cardizem drip, POD 2 repair of ventral hernia, ileus-ng tube to suction, Plan: will follow for hhc and dme needs.  Lives alone.  Expected Discharge Plan: Home/Self Care Barriers to Discharge: Continued Medical Work up   Patient Goals and CMS Choice Patient states their goals for this hospitalization and ongoing recovery are:: i want to go home CMS Medicare.gov Compare Post Acute Care list provided to:: Patient Choice offered to / list presented to : Patient  Expected Discharge Plan and Services Expected Discharge Plan: Home/Self Care       Living arrangements for the past 2 months: Apartment                                      Prior Living Arrangements/Services Living arrangements for the past 2 months: Apartment Lives with:: Self Patient language and need for interpreter reviewed:: No Do you feel safe going back to the place where you live?: Yes      Need for Family Participation in Patient Care: Yes (Comment) Care giver support system in place?: Yes (comment)   Criminal Activity/Legal Involvement Pertinent to Current Situation/Hospitalization: No - Comment as needed  Activities of Daily Living Home Assistive Devices/Equipment: None ADL Screening (condition at time of admission) Patient's cognitive ability adequate to safely complete daily activities?: Yes Is the patient deaf or have difficulty hearing?: No Does the patient have difficulty seeing, even when wearing glasses/contacts?: No Does the patient have difficulty concentrating, remembering, or making decisions?: Yes Patient able to express need for assistance with ADLs?: Yes Does the patient have  difficulty dressing or bathing?: No Independently performs ADLs?: Yes (appropriate for developmental age) Does the patient have difficulty walking or climbing stairs?: Yes Weakness of Legs: Left Weakness of Arms/Hands: Left  Permission Sought/Granted                  Emotional Assessment Appearance:: Appears stated age     Orientation: : Oriented to Self, Oriented to Place, Oriented to  Time, Oriented to Situation Alcohol / Substance Use: Not Applicable Psych Involvement: No (comment)  Admission diagnosis:  Small bowel obstruction ([place]) [K56.609] Patient Active Problem List   Diagnosis Date Noted  . Atrial fibrillation with RVR ([place]) 08/14/2019  . Hypothyroidism   . Incisional hernia with gangrene and obstruction 08/13/2019  . Renal mass 08/05/2019  . Dysuria 05/10/2019  . Other specified disorders of kidney and ureter 05/10/2019  . Renal mass, right 05/10/2019  . Encounter for examination following treatment at hospital 05/10/2019  . Encounter for vaccination 05/10/2019  . Tachycardia 07/04/2018  . Acute renal failure superimposed on stage 3 chronic kidney disease ([place])   . Depression, major, single episode, mild ([place]) 03/21/2018  . CKD (chronic kidney disease) stage 3, GFR 30-59 ml/min 04/03/2016  . GERD (gastroesophageal reflux disease) 08/23/2015  . Osteoarthritis of right knee 08/15/2014  . 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 Reduced vision 05/03/2013  . Papillary thyroid carcinoma ([place]) 04/12/2013  . Vitamin D deficiency 08/16/2009  . POLYNEUROPATHY 04/10/2009  . Hyperlipidemia LDL  goal <100 07/15/2007  . Morbid obesity ([place]) 07/15/2007  . 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 Essential hypertension 07/15/2007  . Seizures ([place]) 07/15/2007   PCP:  [name], MD Pharmacy:   [facility] [place] [address] Phone: [phone] Fax: [phone]     Social Determinants of  Health (SDOH) Interventions    Readmission Risk Interventions No flowsheet data found.

## 2019-08-15 NOTE — Progress Notes (Signed)
Lower extremity venous has been completed.   Preliminary results in CV Proc.   [name] 08/15/2019 11:35 AM

## 2019-08-15 NOTE — Progress Notes (Signed)
[facility] ROUNDING NOTE   Subjective:   This is a 64 year old lady who is a history of obesity,  mild MR, hypertension hyperlipidemia and thyroid cancer with a history of seizure disorder.  She is status post laparoscopic robotic assisted partial nephrectomy on 08/05/19. Taken urgently to the operating room for diagnostic laparoscopy with port site incisional hernia repair on 6/18.    Baseline serum creatinine appears to be about 1.6 mg/dL.  On discharge from the hospital 08/08/2019 creatinine was 1.75.  Found to have AKI on CRF in the setting of above-  crt peaked at 5.89 on 6/18 but has trended down- is at 3.52 this AM- good UOP - 2600 and hemodynamically stable     Objective:  Vital signs in last 24 hours:  Temp:  [98.3 F (36.8 C)-98.8 F (37.1 C)] 98.3 F (36.8 C) (06/21 0800) Pulse Rate:  [80-91] 80 (06/21 0400) Resp:  [12-18] 16 (06/21 0900) BP: (91-175)/(36-106) 155/53 (06/21 0900) SpO2:  [93 %-96 %] 93 % (06/21 0030)  Weight change:  Filed Weights   08/12/19 1041 08/12/19 2224  Weight: 106.1 kg 108.9 kg    Intake/Output: I/O last 3 completed shifts: In: 4724.1 [I.V.:4406.7; NG/GT:30; IV Piggyback:287.4] Out: 3525 [Urine:2925; Emesis/NG output:600]   Intake/Output this shift:  Total I/O In: 100 [NG/GT:100] Out: 1100 [Urine:500; Emesis/NG output:600]   Alert awake and comfortable- no flatus but NGT is clamped CVS- RRR no murmurs rubs or gallops JVP not elevated RS- CTA no wheezes or rales ABD-appropriately tender EXT- min dependent  edema   Basic Metabolic Panel: Recent Labs  Lab 08/12/19 1112 08/12/19 1112 08/13/19 0513 08/14/19 0234 08/15/19 0030 08/15/19 0821  NA 133*  --  136 142 145  --   K 3.7  --  3.7 3.5 3.6  --   CL 94*  --  105 110 117*  --   CO2 18*  --  23 18* 17*  --   GLUCOSE 155*  --  101* 76 69*  --   BUN 45*  --  47* 51* 40*  --   CREATININE 5.89*  --  5.62* 4.67* 3.52*  --   CALCIUM 9.2   < > 7.3* 7.8* 7.9*  --    MG  --   --   --   --   --  1.7   < > = values in this interval not displayed.    Liver Function Tests: Recent Labs  Lab 08/12/19 1112  AST 121*  ALT 194*  ALKPHOS 102  BILITOT 1.1  PROT 8.4*  ALBUMIN 3.3*   No results for input(s): LIPASE, AMYLASE in the last 168 hours. No results for input(s): AMMONIA in the last 168 hours.  CBC: Recent Labs  Lab 08/12/19 1112 08/13/19 0513 08/14/19 0234 08/15/19 0821  WBC 8.3 4.8 5.3  --   NEUTROABS 5.6  --   --   --   HGB 11.6* 9.3* 8.4* 9.0*  HCT 36.0 29.7* 27.7* 28.8*  MCV 84.5 87.6 87.9  --   PLT 393 272 268  --     Cardiac Enzymes: No results for input(s): CKTOTAL, CKMB, CKMBINDEX, TROPONINI in the last 168 hours.  BNP: Invalid input(s): POCBNP  CBG: Recent Labs  Lab 08/12/19 1851 08/15/19 0743  GLUCAP 69* 50*    Microbiology: Results for orders placed or performed during the hospital encounter of 08/12/19  Urine Culture     Status: None   Collection Time: 08/12/19 11:18 AM   Specimen:  Urine, Clean Catch  Result Value Ref Range Status   Specimen Description   Final    URINE, CLEAN CATCH Performed at [facility], [address], [place], [place] [address]    Special Requests   Final    NONE Performed at [facility], [address], [place], [place] [address]    Culture   Final    NO GROWTH Performed at [facility], [place] [address] [place], [place] [address]    Report Status 08/13/2019 FINAL  Final  Blood Culture (routine x 2)     Status: None (Preliminary result)   Collection Time: 08/12/19 11:32 AM   Specimen: BLOOD LEFT ARM  Result Value Ref Range Status   Specimen Description BLOOD LEFT ARM  Final   Special Requests   Final    BOTTLES DRAWN AEROBIC AND ANAEROBIC Blood Culture results may not be optimal due to an excessive volume of blood received in culture bottles   Culture   Final    NO GROWTH 3 DAYS Performed at [facility], [address], [place], [place] [address]    Report Status  PENDING  Incomplete  SARS Coronavirus 2 by RT PCR (hospital order, performed in [place] hospital lab) Nasopharyngeal Nasopharyngeal Swab     Status: None   Collection Time: 08/12/19  1:41 PM   Specimen: Nasopharyngeal Swab  Result Value Ref Range Status   SARS Coronavirus 2 NEGATIVE NEGATIVE Final    Comment: (NOTE) SARS-CoV-2 target nucleic acids are NOT DETECTED.  The SARS-CoV-2 RNA is generally detectable in upper and lower respiratory specimens during the acute phase of infection. The lowest concentration of SARS-CoV-2 viral copies this assay can detect is 250 copies / mL. A negative result does not preclude SARS-CoV-2 infection and should not be used as the sole basis for treatment or other patient management decisions.  A negative result may occur with improper specimen collection / handling, submission of specimen other than nasopharyngeal swab, presence of viral mutation(s) within the areas targeted by this assay, and inadequate number of viral copies (<250 copies / mL). A negative result must be combined with clinical observations, patient history, and epidemiological information.  Fact Sheet for Patients:   StrictlyIdeas.no  Fact Sheet for Healthcare Providers: BankingDealers.co.za  This test is not yet approved or  cleared by the [place] FDA and has been authorized for detection and/or diagnosis of SARS-CoV-2 by FDA under an Emergency Use Authorization (EUA).  This EUA will remain in effect (meaning this test can be used) for the duration of the COVID-19 declaration under Section 564(b)(1) of the Act, 21 U.S.C. section 360bbb-3(b)(1), unless the authorization is terminated or revoked sooner.  Performed at [facility], [address], [place], [place] [address]   Surgical PCR screen     Status: None   Collection Time: 08/12/19  7:05 PM   Specimen: Nasal Mucosa; Nasal Swab  Result Value Ref Range Status   MRSA,  PCR NEGATIVE NEGATIVE Final   Staphylococcus aureus NEGATIVE NEGATIVE Final    Comment: (NOTE) The Xpert SA Assay (FDA approved for NASAL specimens in patients 58 years of age and older), is one component of a comprehensive surveillance program. It is not intended to diagnose infection nor to guide or monitor treatment. Performed at [facility], [place] [address], [place], [place] [address]     Coagulation Studies: Recent Labs    08/12/19 1132  LABPROT 15.3*  INR 1.3*    Urinalysis: Recent Labs  08/12/19 1130  [place] 1.030  PHURINE 5.0  GLUCOSEU NEGATIVE  HGBUR MODERATE*  [place] NEGATIVE  PROTEINUR >=300*  NITRITE NEGATIVE  LEUKOCYTESUR NEGATIVE      Imaging: DG Chest 1 View  Result Date: 08/15/2019 CLINICAL DATA:  Atrial fibrillation EXAM: CHEST  1 VIEW COMPARISON:  One-view chest x-ray 08/12/2019. FINDINGS: Heart is mildly enlarged, exaggerated by low lung volumes. NG tube courses off the inferior border the film. Right IJ line is stable. Linear atelectasis is again noted at the left base. Scarring is present in the right upper lobe. IMPRESSION: 1. Low lung volumes. 2. Stable linear atelectasis at the left base. Electronically Signed   By: San Morelle M.D.   On: 08/15/2019 08:14   DG Abd Portable 1V  Result Date: 08/14/2019 CLINICAL DATA:  Abdominal distension EXAM: PORTABLE ABDOMEN - 1 VIEW COMPARISON:  08/12/2019 FINDINGS: Enteric tube terminates within the gastric body. Moderately dilated air-filled loops of small bowel are again seen throughout the abdomen, similar in caliber to previous study. Diffuse subcutaneous emphysema of the abdominal wall. IMPRESSION: 1. Moderately dilated air-filled loops of small bowel throughout the abdomen, similar in caliber to previous study. 2. Enteric tube terminates within the gastric body. Electronically Signed   By: Davina Poke D.O.   On: 08/14/2019 12:48      Medications:   . sodium chloride 75 mL/hr at 08/15/19 0408  . cefTRIAXone (ROCEPHIN)  IV Stopped (08/14/19 2218)  . diltiazem (CARDIZEM) infusion 7.5 mg/hr (08/14/19 2030)  . levETIRAcetam Stopped (08/15/19 1045)   . Chlorhexidine Gluconate Cloth  6 each Topical Daily  . diltiazem  60 mg Oral Q6H  . ezetimibe  10 mg Oral Daily  . FLUoxetine  10 mg Per Tube Daily  . lamoTRIgine  75 mg Per Tube BID  . levothyroxine  137 mcg Oral Q0600  . mouth rinse  15 mL Mouth Rinse BID  . metoCLOPramide (REGLAN) injection  5 mg Intravenous Q8H  . pantoprazole (PROTONIX) IV  40 mg Intravenous Q12H  . rosuvastatin  40 mg Oral Daily  . sodium chloride flush  10-40 mL Intracatheter Q12H   diphenhydrAMINE **OR** diphenhydrAMINE, HYDROmorphone (DILAUDID) injection, ondansetron, oxyCODONE, phenol, sodium chloride flush, zolpidem  Assessment/ Plan:   Acute kidney injury.  No hydronephrosis.  Patient underwent partial nephrectomy renal cell cancer right 08/05/2019.  Creatinine appears to be about 1.6 to 1.8 mg/dL at baseline.  Patient was admitted with partial small bowel obstruction and need for incisional hernia repair on 08/12/2019.   Suspect acute tubular necrosis.  Patient was prescribed ACE inhibitor lisinopril and was hypotensive on admission.  is now non oliguric and crt trending down nicely   Hypertension/volume blood pressure appears to be improved I would decrease IVF only to keep up with GI losses.    Anemia.  Secondary to blood loss  We will need to continue to follow closely- PRN transfusion. hgb 9 today   Renal cell cancer status post resection 08/05/2019.  Pathology pending  Seizure disorder continues on Keppra and Lamictal.  AKI seems to be recovering-  Renal will not see daily but follow at a distance-  Would anticipate continued recovery toward baseline of between 1.5 and 2- call with questions     LOS: 3 Amela Handley A Julicia Krieger @TODAY @10 :54 AM

## 2019-08-15 NOTE — Progress Notes (Addendum)
OT /PT Cancellation Note  Patient Details Name: [name] MRN: [id number] DOB: 07-11-1955   Cancelled Treatment:    Reason Eval/Treat Not Completed: Patient at procedure or test/ unavailable (Attempted x 2, pt in testing. Will follow.)  Malka So 08/15/2019, 3:23 PM  Nestor Lewandowsky, OTR/L Acute Rehabilitation Services Pager: [phone] Office: [phone]  Gatha Mayer, [place], MPT Acute Rehabilitation Services Office: [phone] Pager: [phone] 08/15/2019

## 2019-08-16 DIAGNOSIS — I10 Essential (primary) hypertension: Secondary | ICD-10-CM

## 2019-08-16 DIAGNOSIS — I480 Paroxysmal atrial fibrillation: Secondary | ICD-10-CM | POA: Diagnosis not present

## 2019-08-16 DIAGNOSIS — E039 Hypothyroidism, unspecified: Secondary | ICD-10-CM | POA: Diagnosis not present

## 2019-08-16 DIAGNOSIS — I272 Pulmonary hypertension, unspecified: Secondary | ICD-10-CM | POA: Diagnosis not present

## 2019-08-16 LAB — CBC WITH DIFFERENTIAL/PLATELET
Abs Immature Granulocytes: 0.05 10*3/uL (ref 0.00–0.07)
Basophils Absolute: 0 10*3/uL (ref 0.0–0.1)
Basophils Relative: 0 %
Eosinophils Absolute: 0.1 10*3/uL (ref 0.0–0.5)
Eosinophils Relative: 2 %
HCT: 26.3 % — ABNORMAL LOW (ref 36.0–46.0)
Hemoglobin: 8.2 g/dL — ABNORMAL LOW (ref 12.0–15.0)
Immature Granulocytes: 1 %
Lymphocytes Relative: 14 %
Lymphs Abs: 1.1 10*3/uL (ref 0.7–4.0)
MCH: 27.2 pg (ref 26.0–34.0)
MCHC: 31.2 g/dL (ref 30.0–36.0)
MCV: 87.1 fL (ref 80.0–100.0)
Monocytes Absolute: 0.9 10*3/uL (ref 0.1–1.0)
Monocytes Relative: 12 %
Neutro Abs: 5.3 10*3/uL (ref 1.7–7.7)
Neutrophils Relative %: 71 %
Platelets: 272 10*3/uL (ref 150–400)
RBC: 3.02 MIL/uL — ABNORMAL LOW (ref 3.87–5.11)
RDW: 15.5 % (ref 11.5–15.5)
WBC: 7.5 10*3/uL (ref 4.0–10.5)
nRBC: 0 % (ref 0.0–0.2)

## 2019-08-16 LAB — BASIC METABOLIC PANEL
Anion gap: 8 (ref 5–15)
BUN: 29 mg/dL — ABNORMAL HIGH (ref 8–23)
CO2: 22 mmol/L (ref 22–32)
Calcium: 8.4 mg/dL — ABNORMAL LOW (ref 8.9–10.3)
Chloride: 115 mmol/L — ABNORMAL HIGH (ref 98–111)
Creatinine, Ser: 3.01 mg/dL — ABNORMAL HIGH (ref 0.44–1.00)
GFR calc Af Amer: 18 mL/min — ABNORMAL LOW (ref >60)
GFR calc non Af Amer: 16 mL/min — ABNORMAL LOW (ref >60)
Glucose, Bld: 116 mg/dL — ABNORMAL HIGH (ref 70–99)
Potassium: 3 mmol/L — ABNORMAL LOW (ref 3.5–5.1)
Sodium: 145 mmol/L (ref 135–145)

## 2019-08-16 LAB — GLUCOSE, CAPILLARY
Glucose-Capillary: 106 mg/dL — ABNORMAL HIGH (ref 70–99)
Glucose-Capillary: 102 mg/dL — ABNORMAL HIGH (ref 70–99)
Glucose-Capillary: 110 mg/dL — ABNORMAL HIGH (ref 70–99)
Glucose-Capillary: 114 mg/dL — ABNORMAL HIGH (ref 70–99)

## 2019-08-16 MED ORDER — POTASSIUM CHLORIDE CRYS ER 20 MEQ PO TBCR
40.0000 meq | EXTENDED_RELEASE_TABLET | Freq: Once | ORAL | Status: AC
  Administered 2019-08-16: 40 meq via ORAL
  Filled 2019-08-16: qty 2

## 2019-08-16 MED ORDER — DILTIAZEM HCL 60 MG PO TABS
90.0000 mg | ORAL_TABLET | Freq: Four times a day (QID) | ORAL | Status: DC
  Administered 2019-08-16 – 2019-08-17 (×4): 90 mg via ORAL
  Filled 2019-08-16 (×4): qty 1

## 2019-08-16 NOTE — Evaluation (Signed)
Occupational Therapy Evaluation Patient Details Name: [name] MRN: [id number] DOB: 03-Nov-1955 Today's Date: 08/16/2019    History of Present Illness [name] is an 64 y.o. female with history of hypertension, seizure disorder, hyperlipidemia, chronic kidney disease stage IIIb, papillary thyroid carcinoma status post thyroidectomy, and and right renal mass status post laparoscopic partial nephrectomy on 08/05/2019 who returned to the emergency department on 08/12/2019 with 2 to 3 days of nausea and vomiting, was found to have an acute kidney injury and partial SBO secondary to incisional hernia, s/p emergent laparosopic hernia reduction / repair 08/12/19, day of admission, for high grade SBO. Onset afib/RVR. POd 2.   Clinical Impression   Pt admitted with the above. Pt currently with functional limitations due to the deficits listed below (see OT Problem List).  Pt will benefit from skilled OT to increase their safety and independence with ADL and functional mobility for ADL to facilitate discharge to venue listed below.   Pt is so sweet and willing to participate but may need  SNF prior to DC home due to lack of A at home.       Follow Up Recommendations  SNF;Home health OT;Supervision/Assistance - 24 hour    Equipment Recommendations  None recommended by OT    Recommendations for Other Services       Precautions / Restrictions Precautions Precautions: Fall      Mobility Bed Mobility Overal bed mobility: Needs Assistance Bed Mobility: Supine to Sit;Sit to Supine     Supine to sit: Min assist Sit to supine: Min assist   General bed mobility comments: extra time to push up to sit, no assistance  Transfers Overall transfer level: Needs assistance Equipment used: 1 person hand held assist Transfers: Sit to/from Stand Sit to Stand: Mod assist Stand pivot transfers: Mod assist       General transfer comment: unsteady in standing with side stepping to head of  bed    Balance Overall balance assessment: Needs assistance Sitting-balance support: Feet supported;No upper extremity supported Sitting balance-Leahy Scale: Fair     Standing balance support: During functional activity;Single extremity supported Standing balance-Leahy Scale: Poor Standing balance comment: requires support                           ADL either performed or assessed with clinical judgement   ADL Overall ADL's : Needs assistance/impaired Eating/Feeding: Cueing for safety   Grooming: Minimal assistance;Sitting   Upper Body Bathing: Minimal assistance;Sitting   Lower Body Bathing: Sit to/from stand;Cueing for safety;+2 for physical assistance;Maximal assistance   Upper Body Dressing : Minimal assistance;Sitting   Lower Body Dressing: Sit to/from stand;Cueing for safety;+2 for physical assistance;Maximal assistance                Pt sat EOB with OT for ADL activity.  Pt very motivated. Upon standing to side step to head of bed pt very unsteady.                    Pertinent Vitals/Pain Pain Assessment: No/denies pain     Hand Dominance     Extremity/Trunk Assessment Upper Extremity Assessment Upper Extremity Assessment: Generalized weakness           Communication     Cognition Arousal/Alertness: Awake/alert Behavior During Therapy: WFL for tasks assessed/performed Overall Cognitive Status: Within Functional Limits for tasks assessed  Home Living Family/patient expects to be discharged to:: Private residence Living Arrangements: Alone Available Help at Discharge: Personal care attendant;Family Type of Home: Apartment Home Access: Stairs to enter;Level entry     Home Layout: One level     Bathroom Shower/Tub: Tub/shower unit         Home Equipment: Environmental consultant - 2 wheels          Prior Functioning/Environment Level of Independence: Needs assistance   Gait / Transfers Assistance Needed: independnent, uses comm. transportation, ADL's / Homemaking Assistance Needed: self care ADL's, PCA assists with meals and  apt cleaning.            OT Problem List: Decreased strength;Decreased activity tolerance;Decreased safety awareness;Impaired balance (sitting and/or standing);Obesity      OT Treatment/Interventions: Self-care/ADL training;Patient/family education;Therapeutic activities;Therapeutic exercise;DME and/or AE instruction    OT Goals(Current goals can be found in the care plan section) Acute Rehab OT Goals Patient Stated Goal: to go home OT Goal Formulation: With patient Time For Goal Achievement: 08/23/19  OT Frequency: Min 2X/week    AM-PAC OT "6 Clicks" Daily Activity     Outcome Measure Help from another person eating meals?: Total Help from another person taking care of personal grooming?: A Little Help from another person toileting, which includes using toliet, bedpan, or urinal?: A Lot Help from another person bathing (including washing, rinsing, drying)?: A Lot Help from another person to put on and taking off regular upper body clothing?: A Lot Help from another person to put on and taking off regular lower body clothing?: A Lot 6 Click Score: 12   End of Session Equipment Utilized During Treatment: Rolling walker Nurse Communication: Mobility status  Activity Tolerance: Patient limited by fatigue Patient left: in bed;with bed alarm set;with call bell/phone within reach  OT Visit Diagnosis: Unsteadiness on feet (R26.81);Other abnormalities of gait and mobility (R26.89);Muscle weakness (generalized) (M62.81)                Time: 1342-1400 OT Time Calculation (min): 18 min Charges:  OT General Charges $OT Visit: 1 Visit OT Evaluation $OT Eval Moderate Complexity: 1 Mod  Kari Baars, [place] [phone] Office- [phone], Edwena Felty D 08/16/2019, 2:37  PM

## 2019-08-16 NOTE — Progress Notes (Signed)
4 Days Post-Op   Subjective/Chief Complaint:    1 - Small Bowel Obstruction / Incisional Hernia - s/p emergent laparosopic hernia reduction / repair 08/12/19, day of admission, for high grade SBO. NPO with NGT post-op. Regaln started 6/21.  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 Ipsilateral cyst decortication benign.   3 - Acute on Chronic Renal Failure - Cr 6's with poor PO intake and ACEI on admit 6/18. CT no hydro / fluid collections. Baseline Cr 1.6's.   4 - Disposition / Rehab - independent at baseline, daughter [name] helps with errands.   Today "[name]" is improving subjetively. Some bowel sounds and flatus this AM.     Objective: Vital signs in last 24 hours: Temp:  [98.3 F (36.8 C)-99 F (37.2 C)] 98.9 F (37.2 C) (06/22 0400) Pulse Rate:  [70-86] 86 (06/22 0100) Resp:  [12-20] 15 (06/22 0606) BP: (124-198)/(36-85) 160/64 (06/22 0606) SpO2:  [92 %-100 %] 100 % (06/22 0400) Last BM Date:  (PTA)  Intake/Output from previous day: 06/21 0701 - 06/22 0700 In: 1366.7 [I.V.:774; NG/GT:100; IV Piggyback:492.7] Out: 3250 [Urine:1850; Emesis/NG output:1400] Intake/Output this shift: No intake/output data recorded.  General appearance: alert, cooperative  Eyes: negative Nose: Nares normal. Septum midline. Mucosa normal. No drainage or sinus tenderness., NGT in place with thinner bilious output, decreased.  Throat: lips, mucosa, and tongue normal; teeth and gums normal Neck: thyroid not enlarged, symmetric, no tenderness/mass/nodules and EJ IV in place w/o site reaction.  Back: symmetric, no curvature. ROM normal. No CVA tenderness. Resp: non-labored on [place] O2.  Cardio: sinus with HR 80s by monitor.  GI: stable obesity and some distention w/o r/g. Recent port sites c/d/i.  Pelvic: external genitalia normal and foley in place with non-foul urine.  Extremities: extremities normal,  atraumatic, no cyanosis or edema Lymph nodes: Cervical, supraclavicular, and axillary nodes normal. Neurologic: Grossly normal Incision/Wound: as per above.   Lab Results:  Recent Labs    08/14/19 0234 08/14/19 0234 08/15/19 0821 08/16/19 0330  WBC 5.3  --   --  7.5  HGB 8.4*   < > 9.0* 8.2*  HCT 27.7*   < > 28.8* 26.3*  PLT 268  --   --  272   < > = values in this interval not displayed.   BMET Recent Labs    08/15/19 0030 08/16/19 0330  NA 145 145  K 3.6 3.0*  CL 117* 115*  CO2 17* 22  GLUCOSE 69* 116*  BUN 40* 29*  CREATININE 3.52* 3.01*  CALCIUM 7.9* 8.4*   PT/INR No results for input(s): LABPROT, INR in the last 72 hours. ABG No results for input(s): PHART, HCO3 in the last 72 hours.  Invalid input(s): PCO2, PO2  Studies/Results: DG Chest 1 View  Result Date: 08/15/2019 CLINICAL DATA:  Atrial fibrillation EXAM: CHEST  1 VIEW COMPARISON:  One-view chest x-ray 08/12/2019. FINDINGS: Heart is mildly enlarged, exaggerated by low lung volumes. NG tube courses off the inferior border the film. Right IJ line is stable. Linear atelectasis is again noted at the left base. Scarring is present in the right upper lobe. IMPRESSION: 1. Low lung volumes. 2. Stable linear atelectasis at the left base. Electronically Signed   By: San Morelle M.D.   On: 08/15/2019 08:14   NM Pulmonary Perfusion  Result Date: 08/15/2019 CLINICAL DATA:  Chest pain. EXAM: NUCLEAR MEDICINE PERFUSION LUNG SCAN TECHNIQUE:  Perfusion images were obtained in multiple projections after intravenous injection of radiopharmaceutical. Ventilation scans intentionally deferred if perfusion scan and chest x-ray adequate for interpretation during COVID 19 epidemic. RADIOPHARMACEUTICALS:  4.0 mCi Tc-20m MAA IV COMPARISON:  Radiograph of same day. FINDINGS: No definite perfusion defect is noted. IMPRESSION: No definite perfusion defect is noted. No definite evidence of pulmonary embolus is noted. Electronically  Signed   By: Marijo Conception M.D.   On: 08/15/2019 18:48   DG Abd Portable 1V  Result Date: 08/14/2019 CLINICAL DATA:  Abdominal distension EXAM: PORTABLE ABDOMEN - 1 VIEW COMPARISON:  08/12/2019 FINDINGS: Enteric tube terminates within the gastric body. Moderately dilated air-filled loops of small bowel are again seen throughout the abdomen, similar in caliber to previous study. Diffuse subcutaneous emphysema of the abdominal wall. IMPRESSION: 1. Moderately dilated air-filled loops of small bowel throughout the abdomen, similar in caliber to previous study. 2. Enteric tube terminates within the gastric body. Electronically Signed   By: Davina Poke D.O.   On: 08/14/2019 12:48   ECHOCARDIOGRAM COMPLETE  Result Date: 08/15/2019    ECHOCARDIOGRAM REPORT   Patient Name:   [name] [facility] Date of Exam: 08/15/2019 Medical Rec #:  [id number]       Height:       65.0 in Accession #:    [id number]      Weight:       240.1 lb Date of Birth:  [birth year]       BSA:          2.138 m Patient Age:    64 years        BP:           124/36 mmHg Patient Gender: F               HR:           94 bpm. Exam Location:  Inpatient Procedure: 2D Echo, Cardiac Doppler and Color Doppler Indications:    I48.0 Paroxysmal atrial fibrillation  History:        Patient has no prior history of Echocardiogram examinations.                 Signs/Symptoms:Dyspnea; Risk Factors:Hypertension and                 Dyslipidemia. Thyroid Cancer. Seizures.  Sonographer:    Jonelle Sidle Dance Referring Phys: [id number] [place]  1. Hyperdynamic LV systolic function; elevated mean gradient across aortic valve (19 mmHg) suggests mild AS; however hyperdynamic LV function at least partially contributing; trace AI; mild TR with severely elevated pulmonary pressure.  2. Left ventricular ejection fraction, by estimation, is >75%. The left ventricle has hyperdynamic function. The left ventricle has no regional wall motion abnormalities. Left  ventricular diastolic parameters were normal.  3. Right ventricular systolic function is normal. The right ventricular size is normal. There is severely elevated pulmonary artery systolic pressure.  4. The mitral valve is normal in structure. Trivial mitral valve regurgitation. No evidence of mitral stenosis.  5. The aortic valve is tricuspid. Aortic valve regurgitation is trivial. Mild aortic valve stenosis.  6. The inferior vena cava is normal in size with greater than 50% respiratory variability, suggesting right atrial pressure of 3 mmHg. FINDINGS  Left Ventricle: Left ventricular ejection fraction, by estimation, is >75%. The left ventricle has hyperdynamic function. The left ventricle has no regional wall motion abnormalities. The left ventricular internal cavity size was normal in size. There is no  left ventricular hypertrophy. Left ventricular diastolic parameters were normal. Right Ventricle: The right ventricular size is normal. Right ventricular systolic function is normal. There is severely elevated pulmonary artery systolic pressure. The tricuspid regurgitant velocity is 3.89 m/s, and with an assumed right atrial pressure  of 3 mmHg, the estimated right ventricular systolic pressure is 79.8 mmHg. Left Atrium: Left atrial size was normal in size. Right Atrium: Right atrial size was normal in size. Pericardium: There is no evidence of pericardial effusion. Mitral Valve: The mitral valve is normal in structure. Normal mobility of the mitral valve leaflets. Trivial mitral valve regurgitation. No evidence of mitral valve stenosis. Tricuspid Valve: The tricuspid valve is normal in structure. Tricuspid valve regurgitation is mild . No evidence of tricuspid stenosis. Aortic Valve: The aortic valve is tricuspid. Aortic valve regurgitation is trivial. Mild aortic stenosis is present. Aortic valve mean gradient measures 16.0 mmHg. Aortic valve peak gradient measures 32.6 mmHg. Aortic valve area, by VTI measures  1.84 cm. Pulmonic Valve: The pulmonic valve was not well visualized. Pulmonic valve regurgitation is not visualized. No evidence of pulmonic stenosis. Aorta: The aortic root is normal in size and structure. Venous: The inferior vena cava is normal in size with greater than 50% respiratory variability, suggesting right atrial pressure of 3 mmHg. IAS/Shunts: No atrial level shunt detected by color flow Doppler. Additional Comments: Hyperdynamic LV systolic function; elevated mean gradient across aortic valve (19 mmHg) suggests mild AS; however hyperdynamic LV function at least partially contributing; trace AI; mild TR with severely elevated pulmonary pressure.  LEFT VENTRICLE PLAX 2D LVIDd:         3.70 cm  Diastology LVIDs:         2.20 cm  LV e' lateral:   14.10 cm/s LV PW:         1.10 cm  LV E/e' lateral: 5.6 LV IVS:        0.70 cm  LV e' medial:    10.00 cm/s LVOT diam:     1.80 cm  LV E/e' medial:  7.8 LV SV:         92 LV SV Index:   43 LVOT Area:     2.54 cm  RIGHT VENTRICLE             IVC RV Basal diam:  2.80 cm     IVC diam: 1.50 cm RV S prime:     23.30 cm/s TAPSE (M-mode): 2.0 cm LEFT ATRIUM             Index       RIGHT ATRIUM           Index LA diam:        2.70 cm 1.26 cm/m  RA Area:     12.00 cm LA Vol (A2C):   55.4 ml 25.91 ml/m RA Volume:   25.50 ml  11.93 ml/m LA Vol (A4C):   56.5 ml 26.43 ml/m LA Biplane Vol: 59.3 ml 27.74 ml/m  AORTIC VALVE AV Area (Vmax):    1.65 cm AV Area (Vmean):   1.68 cm AV Area (VTI):     1.84 cm AV Vmax:           285.33 cm/s AV Vmean:          185.000 cm/s AV VTI:            0.500 m AV Peak Grad:      32.6 mmHg AV Mean Grad:      16.0  mmHg LVOT Vmax:         185.00 cm/s LVOT Vmean:        122.000 cm/s LVOT VTI:          0.361 m LVOT/AV VTI ratio: 0.72  AORTA Ao Root diam: 3.00 cm Ao Asc diam:  3.40 cm MITRAL VALVE               TRICUSPID VALVE MV Area (PHT): 3.17 cm    TR Peak grad:   60.5 mmHg MV Decel Time: 239 msec    TR Vmax:        389.00 cm/s MV E  velocity: 78.50 cm/s MV A velocity: 92.70 cm/s  SHUNTS MV E/A ratio:  0.85        Systemic VTI:  0.36 m                            Systemic Diam: 1.80 cm Kirk Ruths MD Electronically signed by Kirk Ruths MD Signature Date/Time: 08/15/2019/12:23:06 PM    Final    VAS [place] LOWER EXTREMITY VENOUS (DVT)  Result Date: 08/15/2019  Lower Venous DVTStudy Indications: Swelling, and Edema.  Limitations: Body habitus and poor ultrasound/tissue interface. Comparison Study: no prior Performing Technologist: Abram Sander RVS  Examination Guidelines: A complete evaluation includes B-mode imaging, spectral Doppler, color Doppler, and power Doppler as needed of all accessible portions of each vessel. Bilateral testing is considered an integral part of a complete examination. Limited examinations for reoccurring indications may be performed as noted. The reflux portion of the exam is performed with the patient in reverse Trendelenburg.  +---------+---------------+---------+-----------+----------+--------------+ RIGHT    CompressibilityPhasicitySpontaneityPropertiesThrombus Aging +---------+---------------+---------+-----------+----------+--------------+ CFV      Full           Yes      Yes                                 +---------+---------------+---------+-----------+----------+--------------+ SFJ      Full                                                        +---------+---------------+---------+-----------+----------+--------------+ FV Prox  Full                                                        +---------+---------------+---------+-----------+----------+--------------+ FV Mid                  Yes      Yes                                 +---------+---------------+---------+-----------+----------+--------------+ FV Distal                                             Not visualized +---------+---------------+---------+-----------+----------+--------------+ PFV      Full                                                         +---------+---------------+---------+-----------+----------+--------------+  POP      Full           Yes      Yes                                 +---------+---------------+---------+-----------+----------+--------------+ PTV      Full                                                        +---------+---------------+---------+-----------+----------+--------------+ PERO                                                  Not visualized +---------+---------------+---------+-----------+----------+--------------+   +---------+---------------+---------+-----------+----------+--------------+ LEFT     CompressibilityPhasicitySpontaneityPropertiesThrombus Aging +---------+---------------+---------+-----------+----------+--------------+ CFV      Full           Yes      Yes                                 +---------+---------------+---------+-----------+----------+--------------+ SFJ      Full                                                        +---------+---------------+---------+-----------+----------+--------------+ FV Prox  Full                                                        +---------+---------------+---------+-----------+----------+--------------+ FV Mid   Full                                                        +---------+---------------+---------+-----------+----------+--------------+ FV Distal                                             Not visualized +---------+---------------+---------+-----------+----------+--------------+ PFV      Full                                                        +---------+---------------+---------+-----------+----------+--------------+ POP      Full           Yes      Yes                                 +---------+---------------+---------+-----------+----------+--------------+ PTV  Full                                                         +---------+---------------+---------+-----------+----------+--------------+ PERO                                                  Not visualized +---------+---------------+---------+-----------+----------+--------------+     Summary: BILATERAL: - No evidence of deep vein thrombosis seen in the lower extremities, bilaterally. - RIGHT: - A cystic structure is found in the popliteal fossa.  LEFT: - No cystic structure found in the popliteal fossa.  *See table(s) above for measurements and observations. Electronically signed by Ruta Hinds MD on 08/15/2019 at 3:36:46 PM.    Final     Anti-infectives: Anti-infectives (From admission, onward)   Start     Dose/Rate Route Frequency Ordered Stop   08/12/19 2300  cefTRIAXone (ROCEPHIN) 2 g in sodium chloride 0.9 % 100 mL IVPB     Discontinue     2 g 200 mL/hr over 30 Minutes Intravenous Daily at bedtime 08/12/19 2226     08/12/19 1145  cefTRIAXone (ROCEPHIN) 1 g in sodium chloride 0.9 % 100 mL IVPB        1 g 200 mL/hr over 30 Minutes Intravenous  Once 08/12/19 1140 08/12/19 1320      Assessment/Plan:  1 - Small Bowel Obstruction / Incisional Hernia - doing satisfactory POD 4. Clamp NGT today. Likely DC in PM if tollerates. Continue reglan for now.   2 - Right Renal Cancer  - good prognosis. NO further cancer directed therapy this admission.   3 - Acute on Chronic Renal Failure - likely severe pre-renal. Improving with hydraiton. Appreciate nephrol co-managent.   4 - Disposition / Rehab - PT eval for help on DC  / DME planning.   Replace K, remain ICU.   [name] 08/16/2019

## 2019-08-16 NOTE — Progress Notes (Addendum)
[facility] PROGRESS NOTE   [name] YTK:[id number] DOB: 11-26-1955 DOA: 08/12/2019  PCP: [name], MD  Brief History/Interval Summary: 64 y.o. female with history of hypertension, seizure disorder, hyperlipidemia, chronic kidney disease stage IIIb, papillary thyroid carcinoma status post thyroidectomy, and and right renal mass status post laparoscopic partial nephrectomy on 08/05/2019 who returned to the emergency department on 08/12/2019 with 2 to 3 days of nausea and vomiting, was found to have an acute kidney injury and partial SBO secondary to incisional hernia.  Patient was admitted to the urology service and taken to the operating room that same day for port site incisional hernia repair.  On the evening of 6/28 patient was found to have heart rate in the 1 30-1 40 range.  EKG showed atrial fibrillation with RVR.  Patient was transferred to the ICU.  Hospitalist was consulted.   Reason for Visit: Atrial fibrillation with RVR  Procedures:  Transthoracic echocardiogram 6/21 1. Hyperdynamic LV systolic function; elevated mean gradient across  aortic valve (19 mmHg) suggests mild AS; however hyperdynamic LV function  at least partially contributing; trace AI; mild TR with severely elevated  pulmonary pressure.  2. Left ventricular ejection fraction, by estimation, is >75%. The left  ventricle has hyperdynamic function. The left ventricle has no regional  wall motion abnormalities. Left ventricular diastolic parameters were  normal.  3. Right ventricular systolic function is normal. The right ventricular  size is normal. There is severely elevated pulmonary artery systolic  pressure.  4. The mitral valve is normal in structure. Trivial mitral valve  regurgitation. No evidence of mitral stenosis.  5. The aortic valve is tricuspid. Aortic valve regurgitation is trivial.  Mild aortic valve stenosis.  6. The inferior vena cava is normal in size with greater  than 50%  respiratory variability, suggesting right atrial pressure of 3 mmHg.   Antibiotics: Anti-infectives (From admission, onward)   Start     Dose/Rate Route Frequency Ordered Stop   08/12/19 2300  cefTRIAXone (ROCEPHIN) 2 g in sodium chloride 0.9 % 100 mL IVPB     Discontinue     2 g 200 mL/hr over 30 Minutes Intravenous Daily at bedtime 08/12/19 2226     08/12/19 1145  cefTRIAXone (ROCEPHIN) 1 g in sodium chloride 0.9 % 100 mL IVPB        1 g 200 mL/hr over 30 Minutes Intravenous  Once 08/12/19 1140 08/12/19 1320      Subjective/Interval History: Patient states that she is feeling slightly better.  She has passed some gas.  Denies any chest pain shortness of breath.  She was told about her test results.    ROS: Denies any nausea or vomiting.    Assessment/Plan:  Paroxysmal atrial fibrillation Patient developed atrial fibrillation with RVR.  She was transferred to the ICU and placed on a Cardizem infusion.  She subsequently converted to sinus rhythm.  She remains in sinus rhythm this morning.  TSH was normal.  Echocardiogram shows normal systolic function.  Significantly elevated pulmonary pressure is noted.   She was also complaining of some pleuritic chest pain yesterday.  D-dimer was elevated.  VQ scan is low probability for PE.  Doppler studies negative for DVT.  No further work-up at this time. Chads 2 vascular score is 3.  Oral anticoagulation is recommended.  This was discussed with patient briefly.  Will need to also discuss with primary service and wait for her renal function to get better.  And once it  does we can put her on Eliquis if okay with urology. Continue oral Cardizem.  Replace potassium.  Outpatient follow-up with pulmonology and cardiology.  Acute kidney injury superimposed on chronic kidney disease stage IIIb Renal function has been slowly getting better.  Nephrology is following from a distance.  Creatinine down to 3.0 today.  Continue gentle IV hydration.   Replace potassium.    Small bowel obstruction secondary to incisional hernia status post repair on 08/12/2019 Management deferred to the primary team.  Essential hypertension Her antihypertensives including ACE inhibitor and Norvasc are being held.  She remains elevated.  She is on oral Cardizem.  Also on as needed agents.  May need additional agents for blood pressure control.  Hypothyroidism TSH was normal.  She is on levothyroxine.  Normocytic anemia Hemoglobin is low but stable.  No evidence of overt bleeding.  Continue to monitor.  History of seizure disorder Receiving intravenous Keppra and also on Lamictal.  No seizure activity here in the hospital.  Morbid obesity Estimated body mass index is 39.95 kg/m as calculated from the following:   Height as of this encounter: 5\' 5"  (1.651 m).   Weight as of this encounter: 108.9 kg.   DVT Prophylaxis: On SCDs Code Status: Full code   Medications:  Scheduled: . Chlorhexidine Gluconate Cloth  6 each Topical Daily  . diltiazem  60 mg Oral Q6H  . ezetimibe  10 mg Oral Daily  . FLUoxetine  10 mg Per Tube Daily  . lamoTRIgine  75 mg Per Tube BID  . levothyroxine  137 mcg Oral Q0600  . mouth rinse  15 mL Mouth Rinse BID  . metoCLOPramide (REGLAN) injection  5 mg Intravenous Q8H  . pantoprazole (PROTONIX) IV  40 mg Intravenous Q12H  . rosuvastatin  40 mg Oral Daily  . sodium chloride flush  10-40 mL Intracatheter Q12H   Continuous: . cefTRIAXone (ROCEPHIN)  IV Stopped (08/15/19 2213)  . dextrose 5 % and 0.9% NaCl 50 mL/hr at 08/16/19 0600  . levETIRAcetam Stopped (08/16/19 0953)   MVE:[id number] **OR** diphenhydrAMINE, HYDROmorphone (DILAUDID) injection, labetalol, ondansetron, oxyCODONE, phenol, sodium chloride flush, zolpidem   Objective:  Vital Signs  Vitals:   08/16/19 0400 08/16/19 0500 08/16/19 0606 08/16/19 0800  BP: (!) 159/59 (!) 161/64 (!) 160/64 (!) 167/68  Pulse:      Resp: 20 13 15 16   Temp: 98.9 F  (37.2 C)   98.7 F (37.1 C)  TempSrc: Axillary   Oral  SpO2: 100%     Weight:      Height:        Intake/Output Summary (Last 24 hours) at 08/16/2019 1015 Last data filed at 08/16/2019 0600 Gross per 24 hour  Intake 1266.72 ml  Output 2150 ml  Net -883.28 ml   Filed Weights   08/12/19 1041 08/12/19 2224  Weight: 106.1 kg 108.9 kg    General appearance: Awake alert.  In no distress Resp: Normal effort.  Diminished air entry at the bases.  No wheezing or rhonchi. Cardio: S1-S2 is normal regular.  No S3-S4.  No rubs murmurs or bruit GI: Abdomen is soft.  Sluggish bowel sounds heard today.  No masses organomegaly.  Mildly tender.   Extremities: No edema.  Full range of motion of lower extremities. Neurologic: Alert and oriented x3.  No focal neurological deficits.       Lab Results:  Data Reviewed: I have personally reviewed following labs and imaging studies  CBC: Recent Labs  Lab 08/12/19 1112  08/13/19 0513 08/14/19 0234 08/15/19 0821 08/16/19 0330  WBC 8.3 4.8 5.3  --  7.5  NEUTROABS 5.6  --   --   --  5.3  HGB 11.6* 9.3* 8.4* 9.0* 8.2*  HCT 36.0 29.7* 27.7* 28.8* 26.3*  MCV 84.5 87.6 87.9  --  87.1  PLT 393 272 268  --  378    Basic Metabolic Panel: Recent Labs  Lab 08/12/19 1112 08/13/19 0513 08/14/19 0234 08/15/19 0030 08/15/19 0821 08/16/19 0330  NA 133* 136 142 145  --  145  K 3.7 3.7 3.5 3.6  --  3.0*  CL 94* 105 110 117*  --  115*  CO2 18* 23 18* 17*  --  22  GLUCOSE 155* 101* 76 69*  --  116*  BUN 45* 47* 51* 40*  --  29*  CREATININE 5.89* 5.62* 4.67* 3.52*  --  3.01*  CALCIUM 9.2 7.3* 7.8* 7.9*  --  8.4*  MG  --   --   --   --  1.7  --     GFR: Estimated Creatinine Clearance: 22.9 mL/min (A) (by C-G formula based on SCr of 3.01 mg/dL (H)).  Liver Function Tests: Recent Labs  Lab 08/12/19 1112  AST 121*  ALT 194*  ALKPHOS 102  BILITOT 1.1  PROT 8.4*  ALBUMIN 3.3*    Coagulation Profile: Recent Labs  Lab 08/12/19 1132  INR  1.3*    CBG: Recent Labs  Lab 08/15/19 0743 08/15/19 1148 08/15/19 1757 08/15/19 2355 08/16/19 0544  GLUCAP 68* 68* 87 99 106*     Thyroid Function Tests: Recent Labs    08/15/19 0821  TSH 0.819      Recent Results (from the past 240 hour(s))  Urine Culture     Status: None   Collection Time: 08/12/19 11:18 AM   Specimen: Urine, Clean Catch  Result Value Ref Range Status   Specimen Description   Final    URINE, CLEAN CATCH Performed at [facility], [address] [place], [place] [address]    Special Requests   Final    NONE Performed at [facility], [address] [place], [place] [address]    Culture   Final    NO GROWTH Performed at [facility], [place] [address] [place], [place] [address]    Report Status 08/13/2019 FINAL  Final  Blood Culture (routine x 2)     Status: None (Preliminary result)   Collection Time: 08/12/19 11:32 AM   Specimen: BLOOD LEFT ARM  Result Value Ref Range Status   Specimen Description BLOOD LEFT ARM  Final   Special Requests   Final    BOTTLES DRAWN AEROBIC AND ANAEROBIC Blood Culture results may not be optimal due to an excessive volume of blood received in culture bottles   Culture   Final    NO GROWTH 4 DAYS Performed at [facility], [address] [place], [place] [address]    Report Status PENDING  Incomplete  SARS Coronavirus 2 by RT PCR (hospital order, performed in [facility] hospital lab) Nasopharyngeal Nasopharyngeal Swab     Status: None   Collection Time: 08/12/19  1:41 PM   Specimen: Nasopharyngeal Swab  Result Value Ref Range Status   SARS Coronavirus 2 NEGATIVE NEGATIVE Final    Comment: (NOTE) SARS-CoV-2 target nucleic acids are NOT DETECTED.  The SARS-CoV-2 RNA is generally detectable in upper and lower respiratory specimens during the acute phase of infection. The lowest concentration  of SARS-CoV-2 viral copies this assay can detect is 250 copies / mL. A negative result does not preclude  SARS-CoV-2 infection and should not be used as the sole basis for treatment or other patient management decisions.  A negative result may occur with improper specimen collection / handling, submission of specimen other than nasopharyngeal swab, presence of viral mutation(s) within the areas targeted by this assay, and inadequate number of viral copies (<250 copies / mL). A negative result must be combined with clinical observations, patient history, and epidemiological information.  Fact Sheet for Patients:   StrictlyIdeas.no  Fact Sheet for Healthcare Providers: BankingDealers.co.za  This test is not yet approved or  cleared by the [place] FDA and has been authorized for detection and/or diagnosis of SARS-CoV-2 by FDA under an Emergency Use Authorization (EUA).  This EUA will remain in effect (meaning this test can be used) for the duration of the COVID-19 declaration under Section 564(b)(1) of the Act, 21 U.S.C. section 360bbb-3(b)(1), unless the authorization is terminated or revoked sooner.  Performed at [facility], [address] [place], [place] [address]   Surgical PCR screen     Status: None   Collection Time: 08/12/19  7:05 PM   Specimen: Nasal Mucosa; Nasal Swab  Result Value Ref Range Status   MRSA, PCR NEGATIVE NEGATIVE Final   Staphylococcus aureus NEGATIVE NEGATIVE Final    Comment: (NOTE) The Xpert SA Assay (FDA approved for NASAL specimens in patients 57 years of age and older), is one component of a comprehensive surveillance program. It is not intended to diagnose infection nor to guide or monitor treatment. Performed at [facility], [place] [address] [place], [place] [address]       Radiology Studies: DG Chest 1 View  Result Date: 08/15/2019 CLINICAL DATA:  Atrial fibrillation EXAM: CHEST  1 VIEW COMPARISON:  One-view chest x-ray 08/12/2019. FINDINGS: Heart is mildly enlarged,  exaggerated by low lung volumes. NG tube courses off the inferior border the film. Right IJ line is stable. Linear atelectasis is again noted at the left base. Scarring is present in the right upper lobe. IMPRESSION: 1. Low lung volumes. 2. Stable linear atelectasis at the left base. Electronically Signed   By: San Morelle M.D.   On: 08/15/2019 08:14   NM Pulmonary Perfusion  Result Date: 08/15/2019 CLINICAL DATA:  Chest pain. EXAM: NUCLEAR MEDICINE PERFUSION LUNG SCAN TECHNIQUE: Perfusion images were obtained in multiple projections after intravenous injection of radiopharmaceutical. Ventilation scans intentionally deferred if perfusion scan and chest x-ray adequate for interpretation during COVID 19 epidemic. RADIOPHARMACEUTICALS:  4.0 mCi Tc-58m MAA IV COMPARISON:  Radiograph of same day. FINDINGS: No definite perfusion defect is noted. IMPRESSION: No definite perfusion defect is noted. No definite evidence of pulmonary embolus is noted. Electronically Signed   By: Marijo Conception M.D.   On: 08/15/2019 18:48   ECHOCARDIOGRAM COMPLETE  Result Date: 08/15/2019    ECHOCARDIOGRAM REPORT   Patient Name:   HAADIYA FROGGE [facility] Date of Exam: 08/15/2019 Medical Rec #:  [id number]       Height:       65.0 in Accession #:    [id number]      Weight:       240.1 lb Date of Birth:  11/20/1955       BSA:          2.138 m Patient Age:    9 years        BP:  124/36 mmHg Patient Gender: F               HR:           94 bpm. Exam Location:  Inpatient Procedure: 2D Echo, Cardiac Doppler and Color Doppler Indications:    I48.0 Paroxysmal atrial fibrillation  History:        Patient has no prior history of Echocardiogram examinations.                 Signs/Symptoms:Dyspnea; Risk Factors:Hypertension and                 Dyslipidemia. Thyroid Cancer. Seizures.  Sonographer:    Jonelle Sidle Dance Referring Phys: [id number] [place]  1. Hyperdynamic LV systolic function; elevated mean gradient across aortic  valve (19 mmHg) suggests mild AS; however hyperdynamic LV function at least partially contributing; trace AI; mild TR with severely elevated pulmonary pressure.  2. Left ventricular ejection fraction, by estimation, is >75%. The left ventricle has hyperdynamic function. The left ventricle has no regional wall motion abnormalities. Left ventricular diastolic parameters were normal.  3. Right ventricular systolic function is normal. The right ventricular size is normal. There is severely elevated pulmonary artery systolic pressure.  4. The mitral valve is normal in structure. Trivial mitral valve regurgitation. No evidence of mitral stenosis.  5. The aortic valve is tricuspid. Aortic valve regurgitation is trivial. Mild aortic valve stenosis.  6. The inferior vena cava is normal in size with greater than 50% respiratory variability, suggesting right atrial pressure of 3 mmHg. FINDINGS  Left Ventricle: Left ventricular ejection fraction, by estimation, is >75%. The left ventricle has hyperdynamic function. The left ventricle has no regional wall motion abnormalities. The left ventricular internal cavity size was normal in size. There is no left ventricular hypertrophy. Left ventricular diastolic parameters were normal. Right Ventricle: The right ventricular size is normal. Right ventricular systolic function is normal. There is severely elevated pulmonary artery systolic pressure. The tricuspid regurgitant velocity is 3.89 m/s, and with an assumed right atrial pressure  of 3 mmHg, the estimated right ventricular systolic pressure is 68.3 mmHg. Left Atrium: Left atrial size was normal in size. Right Atrium: Right atrial size was normal in size. Pericardium: There is no evidence of pericardial effusion. Mitral Valve: The mitral valve is normal in structure. Normal mobility of the mitral valve leaflets. Trivial mitral valve regurgitation. No evidence of mitral valve stenosis. Tricuspid Valve: The tricuspid valve is normal  in structure. Tricuspid valve regurgitation is mild . No evidence of tricuspid stenosis. Aortic Valve: The aortic valve is tricuspid. Aortic valve regurgitation is trivial. Mild aortic stenosis is present. Aortic valve mean gradient measures 16.0 mmHg. Aortic valve peak gradient measures 32.6 mmHg. Aortic valve area, by VTI measures 1.84 cm. Pulmonic Valve: The pulmonic valve was not well visualized. Pulmonic valve regurgitation is not visualized. No evidence of pulmonic stenosis. Aorta: The aortic root is normal in size and structure. Venous: The inferior vena cava is normal in size with greater than 50% respiratory variability, suggesting right atrial pressure of 3 mmHg. IAS/Shunts: No atrial level shunt detected by color flow Doppler. Additional Comments: Hyperdynamic LV systolic function; elevated mean gradient across aortic valve (19 mmHg) suggests mild AS; however hyperdynamic LV function at least partially contributing; trace AI; mild TR with severely elevated pulmonary pressure.  LEFT VENTRICLE PLAX 2D LVIDd:         3.70 cm  Diastology LVIDs:  2.20 cm  LV e' lateral:   14.10 cm/s LV PW:         1.10 cm  LV E/e' lateral: 5.6 LV IVS:        0.70 cm  LV e' medial:    10.00 cm/s LVOT diam:     1.80 cm  LV E/e' medial:  7.8 LV SV:         92 LV SV Index:   43 LVOT Area:     2.54 cm  RIGHT VENTRICLE             IVC RV Basal diam:  2.80 cm     IVC diam: 1.50 cm RV S prime:     23.30 cm/s TAPSE (M-mode): 2.0 cm LEFT ATRIUM             Index       RIGHT ATRIUM           Index LA diam:        2.70 cm 1.26 cm/m  RA Area:     12.00 cm LA Vol (A2C):   55.4 ml 25.91 ml/m RA Volume:   25.50 ml  11.93 ml/m LA Vol (A4C):   56.5 ml 26.43 ml/m LA Biplane Vol: 59.3 ml 27.74 ml/m  AORTIC VALVE AV Area (Vmax):    1.65 cm AV Area (Vmean):   1.68 cm AV Area (VTI):     1.84 cm AV Vmax:           285.33 cm/s AV Vmean:          185.000 cm/s AV VTI:            0.500 m AV Peak Grad:      32.6 mmHg AV Mean Grad:       16.0 mmHg LVOT Vmax:         185.00 cm/s LVOT Vmean:        122.000 cm/s LVOT VTI:          0.361 m LVOT/AV VTI ratio: 0.72  AORTA Ao Root diam: 3.00 cm Ao Asc diam:  3.40 cm MITRAL VALVE               TRICUSPID VALVE MV Area (PHT): 3.17 cm    TR Peak grad:   60.5 mmHg MV Decel Time: 239 msec    TR Vmax:        389.00 cm/s MV E velocity: 78.50 cm/s MV A velocity: 92.70 cm/s  SHUNTS MV E/A ratio:  0.85        Systemic VTI:  0.36 m                            Systemic Diam: 1.80 cm Kirk Ruths MD Electronically signed by Kirk Ruths MD Signature Date/Time: 08/15/2019/12:23:06 PM    Final    VAS [place] LOWER EXTREMITY VENOUS (DVT)  Result Date: 08/15/2019  Lower Venous DVTStudy Indications: Swelling, and Edema.  Limitations: Body habitus and poor ultrasound/tissue interface. Comparison Study: no prior Performing Technologist: Abram Sander RVS  Examination Guidelines: A complete evaluation includes B-mode imaging, spectral Doppler, color Doppler, and power Doppler as needed of all accessible portions of each vessel. Bilateral testing is considered an integral part of a complete examination. Limited examinations for reoccurring indications may be performed as noted. The reflux portion of the exam is performed with the patient in reverse Trendelenburg.  +---------+---------------+---------+-----------+----------+--------------+ RIGHT    CompressibilityPhasicitySpontaneityPropertiesThrombus Aging +---------+---------------+---------+-----------+----------+--------------+ CFV  Full           Yes      Yes                                 +---------+---------------+---------+-----------+----------+--------------+ SFJ      Full                                                        +---------+---------------+---------+-----------+----------+--------------+ FV Prox  Full                                                         +---------+---------------+---------+-----------+----------+--------------+ FV Mid                  Yes      Yes                                 +---------+---------------+---------+-----------+----------+--------------+ FV Distal                                             Not visualized +---------+---------------+---------+-----------+----------+--------------+ PFV      Full                                                        +---------+---------------+---------+-----------+----------+--------------+ POP      Full           Yes      Yes                                 +---------+---------------+---------+-----------+----------+--------------+ PTV      Full                                                        +---------+---------------+---------+-----------+----------+--------------+ PERO                                                  Not visualized +---------+---------------+---------+-----------+----------+--------------+   +---------+---------------+---------+-----------+----------+--------------+ LEFT     CompressibilityPhasicitySpontaneityPropertiesThrombus Aging +---------+---------------+---------+-----------+----------+--------------+ CFV      Full           Yes      Yes                                 +---------+---------------+---------+-----------+----------+--------------+ SFJ      Full                                                        +---------+---------------+---------+-----------+----------+--------------+  FV Prox  Full                                                        +---------+---------------+---------+-----------+----------+--------------+ FV Mid   Full                                                        +---------+---------------+---------+-----------+----------+--------------+ FV Distal                                             Not visualized  +---------+---------------+---------+-----------+----------+--------------+ PFV      Full                                                        +---------+---------------+---------+-----------+----------+--------------+ POP      Full           Yes      Yes                                 +---------+---------------+---------+-----------+----------+--------------+ PTV      Full                                                        +---------+---------------+---------+-----------+----------+--------------+ PERO                                                  Not visualized +---------+---------------+---------+-----------+----------+--------------+     Summary: BILATERAL: - No evidence of deep vein thrombosis seen in the lower extremities, bilaterally. - RIGHT: - A cystic structure is found in the popliteal fossa.  LEFT: - No cystic structure found in the popliteal fossa.  *See table(s) above for measurements and observations. Electronically signed by Ruta Hinds MD on 08/15/2019 at 3:36:46 PM.    Final        LOS: 4 days   [facility] Pager on [email or web address]  08/16/2019, 10:15 AM

## 2019-08-16 NOTE — Evaluation (Signed)
Physical Therapy Evaluation Patient Details Name: [name] MRN: [id number] DOB: 08/13/1955 Today's Date: 08/16/2019   History of Present Illness  [name] is an 64 y.o. female with history of hypertension, seizure disorder, hyperlipidemia, chronic kidney disease stage IIIb, papillary thyroid carcinoma status post thyroidectomy, and and right renal mass status post laparoscopic partial nephrectomy on 08/05/2019 who returned to the emergency department on 08/12/2019 with 2 to 3 days of nausea and vomiting, was found to have an acute kidney injury and partial SBO secondary to incisional hernia, s/p emergent laparosopic hernia reduction / repair 08/12/19, day of admission, for high grade SBO. Onset afib/RVR.  Clinical Impression  The patient participated in bed mobility, stand and pivot to [facility], then to recliner with mod assist of 1. Patient's balance noted with posterior lean and wide base.  RN reports daughter is very supportive, that  Patient  PCA services- Can PCA sevices  increase hours/frequency for 24.7.   HR 90-113, SPO2 RA > 95%. Pt admitted with above diagnosis.  Pt currently with functional limitations due to the deficits listed below (see PT Problem List). Pt will benefit from skilled PT to increase their independence and safety with mobility to allow discharge to the venue listed below.       Follow Up Recommendations Home health PT with increased services/caregiver  for 24/7 vs. SNF    Equipment Recommendations  3in1 (PT)    Recommendations for Other Services       Precautions / Restrictions Precautions Precautions: Fall Restrictions Weight Bearing Restrictions: No      Mobility  Bed Mobility Overal bed mobility: Needs Assistance Bed Mobility: Supine to Sit     Supine to sit: Min guard     General bed mobility comments: extra time to push up to sit, no assistance  Transfers Overall transfer level: Needs assistance Equipment used: 1 person hand held  assist Transfers: Sit to/from Stand;Stand Pivot Transfers Sit to Stand: Mod assist Stand pivot transfers: Mod assist       General transfer comment: wide base, trunk unsteady, posterior lean, Assisted to stand and pivot to [facility] then again to pivot to recliner mod assistance and HHA. HR 113, sats on RA 95%  Ambulation/Gait                Stairs            Wheelchair Mobility    Modified Rankin (Stroke Patients Only)       Balance Overall balance assessment: Needs assistance Sitting-balance support: Feet supported;No upper extremity supported Sitting balance-Leahy Scale: Fair     Standing balance support: During functional activity;Single extremity supported Standing balance-Leahy Scale: Poor Standing balance comment: requires support                             Pertinent Vitals/Pain      Home Living Family/patient expects to be discharged to:: Private residence Living Arrangements: Alone Available Help at Discharge: Personal care attendant;Family Type of Home: Apartment Home Access: Stairs to enter;Level entry     Home Layout: One level Home Equipment: Walker - 2 wheels      Prior Function Level of Independence: Needs assistance   Gait / Transfers Assistance Needed: independnent, uses comm. transportation,  ADL's / Homemaking Assistance Needed: self care ADL's, PCA 4-8 PM 5 days /week, assists with meals and  apt cleaning.        Hand Dominance  Extremity/Trunk Assessment   Upper Extremity Assessment Upper Extremity Assessment: Defer to OT evaluation    Lower Extremity Assessment Lower Extremity Assessment: Generalized weakness    Cervical / Trunk Assessment Cervical / Trunk Assessment: Normal  Communication      Cognition Arousal/Alertness: Awake/alert Behavior During Therapy: WFL for tasks assessed/performed Overall Cognitive Status: Within Functional Limits for tasks assessed                                         General Comments      Exercises     Assessment/Plan    PT Assessment Patient needs continued PT services  PT Problem List Decreased strength;Decreased balance;Decreased knowledge of precautions;Decreased mobility;Decreased activity tolerance;Decreased safety awareness;Cardiopulmonary status limiting activity       PT Treatment Interventions DME instruction;Therapeutic activities;Gait training;Therapeutic exercise;Patient/family education;Functional mobility training    PT Goals (Current goals can be found in the Care Plan section)  Acute Rehab PT Goals Patient Stated Goal: to go home PT Goal Formulation: With patient Time For Goal Achievement: 08/30/19 Potential to Achieve Goals: Good    Frequency Min 3X/week   Barriers to discharge        Co-evaluation               AM-PAC PT "6 Clicks" Mobility  Outcome Measure Help needed turning from your back to your side while in a flat bed without using bedrails?: A Little Help needed moving from lying on your back to sitting on the side of a flat bed without using bedrails?: A Little Help needed moving to and from a bed to a chair (including a wheelchair)?: A Lot Help needed standing up from a chair using your arms (e.g., wheelchair or bedside chair)?: A Lot Help needed to walk in hospital room?: Total Help needed climbing 3-5 steps with a railing? : Total 6 Click Score: 12    End of Session   Activity Tolerance: Patient tolerated treatment well Patient left: in chair;with call bell/phone within reach;with chair alarm set Nurse Communication: Mobility status (+ BM) PT Visit Diagnosis: Unsteadiness on feet (R26.81);Difficulty in walking, not elsewhere classified (R26.2);Muscle weakness (generalized) (M62.81)    Time: 7416-3845 PT Time Calculation (min) (ACUTE ONLY): 28 min   Charges:   PT Evaluation $PT Eval Low Complexity: 1 Low PT Treatments $Therapeutic Activity: 8-22 mins         Tresa Endo PT Acute Rehabilitation Services Pager [phone] Office [phone]   Claretha Cooper 08/16/2019, 9:04 AM

## 2019-08-17 DIAGNOSIS — I480 Paroxysmal atrial fibrillation: Secondary | ICD-10-CM | POA: Diagnosis not present

## 2019-08-17 DIAGNOSIS — I272 Pulmonary hypertension, unspecified: Secondary | ICD-10-CM | POA: Diagnosis not present

## 2019-08-17 DIAGNOSIS — E039 Hypothyroidism, unspecified: Secondary | ICD-10-CM | POA: Diagnosis not present

## 2019-08-17 DIAGNOSIS — I10 Essential (primary) hypertension: Secondary | ICD-10-CM | POA: Diagnosis not present

## 2019-08-17 LAB — BASIC METABOLIC PANEL
Anion gap: 8 (ref 5–15)
BUN: 17 mg/dL (ref 8–23)
CO2: 23 mmol/L (ref 22–32)
Calcium: 8.1 mg/dL — ABNORMAL LOW (ref 8.9–10.3)
Chloride: 112 mmol/L — ABNORMAL HIGH (ref 98–111)
Creatinine, Ser: 2.78 mg/dL — ABNORMAL HIGH (ref 0.44–1.00)
GFR calc Af Amer: 20 mL/min — ABNORMAL LOW (ref >60)
GFR calc non Af Amer: 17 mL/min — ABNORMAL LOW (ref >60)
Glucose, Bld: 122 mg/dL — ABNORMAL HIGH (ref 70–99)
Potassium: 3 mmol/L — ABNORMAL LOW (ref 3.5–5.1)
Sodium: 143 mmol/L (ref 135–145)

## 2019-08-17 LAB — GLUCOSE, CAPILLARY
Glucose-Capillary: 103 mg/dL — ABNORMAL HIGH (ref 70–99)
Glucose-Capillary: 108 mg/dL — ABNORMAL HIGH (ref 70–99)
Glucose-Capillary: 110 mg/dL — ABNORMAL HIGH (ref 70–99)
Glucose-Capillary: 90 mg/dL (ref 70–99)

## 2019-08-17 LAB — CBC WITH DIFFERENTIAL/PLATELET
Abs Immature Granulocytes: 0.05 10*3/uL (ref 0.00–0.07)
Basophils Absolute: 0 10*3/uL (ref 0.0–0.1)
Basophils Relative: 0 %
Eosinophils Absolute: 0.1 10*3/uL (ref 0.0–0.5)
Eosinophils Relative: 2 %
HCT: 27.8 % — ABNORMAL LOW (ref 36.0–46.0)
Hemoglobin: 8.5 g/dL — ABNORMAL LOW (ref 12.0–15.0)
Immature Granulocytes: 1 %
Lymphocytes Relative: 17 %
Lymphs Abs: 1.2 10*3/uL (ref 0.7–4.0)
MCH: 27.1 pg (ref 26.0–34.0)
MCHC: 30.6 g/dL (ref 30.0–36.0)
MCV: 88.5 fL (ref 80.0–100.0)
Monocytes Absolute: 0.8 10*3/uL (ref 0.1–1.0)
Monocytes Relative: 11 %
Neutro Abs: 4.9 10*3/uL (ref 1.7–7.7)
Neutrophils Relative %: 69 %
Platelets: 293 10*3/uL (ref 150–400)
RBC: 3.14 MIL/uL — ABNORMAL LOW (ref 3.87–5.11)
RDW: 15.6 % — ABNORMAL HIGH (ref 11.5–15.5)
WBC: 7.1 10*3/uL (ref 4.0–10.5)
nRBC: 0.3 % — ABNORMAL HIGH (ref 0.0–0.2)

## 2019-08-17 LAB — CULTURE, BLOOD (ROUTINE X 2): Culture: NO GROWTH

## 2019-08-17 MED ORDER — PANTOPRAZOLE SODIUM 40 MG PO TBEC
40.0000 mg | DELAYED_RELEASE_TABLET | Freq: Two times a day (BID) | ORAL | Status: DC
  Administered 2019-08-17 – 2019-08-26 (×18): 40 mg via ORAL
  Filled 2019-08-17 (×18): qty 1

## 2019-08-17 MED ORDER — DILTIAZEM HCL ER COATED BEADS 180 MG PO CP24
300.0000 mg | ORAL_CAPSULE | Freq: Every day | ORAL | Status: DC
  Administered 2019-08-17 – 2019-08-18 (×2): 300 mg via ORAL
  Filled 2019-08-17 (×2): qty 1

## 2019-08-17 MED ORDER — POTASSIUM CHLORIDE CRYS ER 20 MEQ PO TBCR
40.0000 meq | EXTENDED_RELEASE_TABLET | ORAL | Status: AC
  Administered 2019-08-17 (×2): 40 meq via ORAL
  Filled 2019-08-17 (×2): qty 2

## 2019-08-17 MED ORDER — LAMOTRIGINE 25 MG PO TABS
75.0000 mg | ORAL_TABLET | Freq: Two times a day (BID) | ORAL | Status: DC
  Administered 2019-08-17 – 2019-08-26 (×18): 75 mg via ORAL
  Filled 2019-08-17 (×18): qty 3

## 2019-08-17 MED ORDER — LEVETIRACETAM 500 MG PO TABS
1500.0000 mg | ORAL_TABLET | Freq: Two times a day (BID) | ORAL | Status: DC
  Administered 2019-08-17 – 2019-08-26 (×18): 1500 mg via ORAL
  Filled 2019-08-17 (×18): qty 3

## 2019-08-17 MED ORDER — FLUOXETINE HCL 10 MG PO CAPS
10.0000 mg | ORAL_CAPSULE | Freq: Every day | ORAL | Status: DC
  Administered 2019-08-18 – 2019-08-26 (×9): 10 mg via ORAL
  Filled 2019-08-17 (×9): qty 1

## 2019-08-17 NOTE — Progress Notes (Signed)
[facility] PROGRESS NOTE   [name] OZH:[id number] DOB: Sep 23, 1955 DOA: 08/12/2019  PCP: [name], MD  Brief History/Interval Summary: 64 y.o. female with history of hypertension, seizure disorder, hyperlipidemia, chronic kidney disease stage IIIb, papillary thyroid carcinoma status post thyroidectomy, and and right renal mass status post laparoscopic partial nephrectomy on 08/05/2019 who returned to the emergency department on 08/12/2019 with 2 to 3 days of nausea and vomiting, was found to have an acute kidney injury and partial SBO secondary to incisional hernia.  Patient was admitted to the urology service and taken to the operating room that same day for port site incisional hernia repair.  On the evening of 6/28 patient was found to have heart rate in the 1 30-1 40 range.  EKG showed atrial fibrillation with RVR.  Patient was transferred to the ICU.  Hospitalist was consulted.   Reason for Visit: Atrial fibrillation with RVR  Procedures:  Transthoracic echocardiogram 6/21 1. Hyperdynamic LV systolic function; elevated mean gradient across  aortic valve (19 mmHg) suggests mild AS; however hyperdynamic LV function  at least partially contributing; trace AI; mild TR with severely elevated  pulmonary pressure.  2. Left ventricular ejection fraction, by estimation, is >75%. The left  ventricle has hyperdynamic function. The left ventricle has no regional  wall motion abnormalities. Left ventricular diastolic parameters were  normal.  3. Right ventricular systolic function is normal. The right ventricular  size is normal. There is severely elevated pulmonary artery systolic  pressure.  4. The mitral valve is normal in structure. Trivial mitral valve  regurgitation. No evidence of mitral stenosis.  5. The aortic valve is tricuspid. Aortic valve regurgitation is trivial.  Mild aortic valve stenosis.  6. The inferior vena cava is normal in size with greater  than 50%  respiratory variability, suggesting right atrial pressure of 3 mmHg.   Antibiotics: Anti-infectives (From admission, onward)   Start     Dose/Rate Route Frequency Ordered Stop   08/12/19 2300  cefTRIAXone (ROCEPHIN) 2 g in sodium chloride 0.9 % 100 mL IVPB     Discontinue     2 g 200 mL/hr over 30 Minutes Intravenous Daily at bedtime 08/12/19 2226     08/12/19 1145  cefTRIAXone (ROCEPHIN) 1 g in sodium chloride 0.9 % 100 mL IVPB        1 g 200 mL/hr over 30 Minutes Intravenous  Once 08/12/19 1140 08/12/19 1320      Subjective/Interval History: Patient feels better.  Her NG tube was taken out yesterday.  She is passing gas.  She is tolerating her medications.  Denies any abdominal pain.  No shortness of breath.  Anticoagulation was discussed with her.  She is agreeable.  However she did asked me to talk to her daughter as well.      Assessment/Plan:  Paroxysmal atrial fibrillation Patient developed atrial fibrillation with RVR.  She was transferred to the ICU and placed on a Cardizem infusion.  She subsequently converted to sinus rhythm.  She remains in sinus rhythm this morning.  TSH was normal.  Echocardiogram shows normal systolic function.  Significantly elevated pulmonary pressure is noted.   She was also complaining of some pleuritic chest pain yesterday.  D-dimer was elevated.  VQ scan is low probability for PE.  Doppler studies negative for DVT.  No further work-up at this time. Chads 2 vascular score is 3.  Oral anticoagulation is recommended.  Discussed with the patient and her daughter in detail.  They are agreeable.  We will also need to discuss with urology before initiating Eliquis.  Renal function seems to be getting better.   She remains in sinus rhythm.  Continue oral Cardizem.  Change to long-acting.  Replace potassium.  She will need outpatient follow-up with cardiology and pulmonology.  She lives in the [place] area.  This can be arranged by her PCP.     Acute kidney injury superimposed on chronic kidney disease stage IIIb/hypokalemia Renal function improving.  Creatinine is down to 2.7.  Nephrology has been following.  Monitor urine output.  Remains on gentle IV hydration.  Replace potassium.  Small bowel obstruction secondary to incisional hernia status post repair on 08/12/2019 Management deferred to the primary team.  Looks like NG tube was removed yesterday.  Essential hypertension Her antihypertensives including ACE inhibitor and Norvasc are being held.  Poorly controlled blood pressure.  Seems to be better over the last 24 hours.  Continue Cardizem for now.    Hypothyroidism TSH was normal.  She is on levothyroxine.  Normocytic anemia Hemoglobin is low but stable.  No evidence of overt bleeding.  Continue to monitor.  History of seizure disorder Receiving intravenous Keppra and also on Lamictal.  No seizure activity here in the hospital.  Change Keppra to oral.  Morbid obesity Estimated body mass index is 39.95 kg/m as calculated from the following:   Height as of this encounter: 5\' 5"  (1.651 m).   Weight as of this encounter: 108.9 kg.   DVT Prophylaxis: On SCDs Code Status: Full code   Medications:  Scheduled: . Chlorhexidine Gluconate Cloth  6 each Topical Daily  . diltiazem  90 mg Oral Q6H  . ezetimibe  10 mg Oral Daily  . FLUoxetine  10 mg Per Tube Daily  . lamoTRIgine  75 mg Per Tube BID  . levothyroxine  137 mcg Oral Q0600  . mouth rinse  15 mL Mouth Rinse BID  . metoCLOPramide (REGLAN) injection  5 mg Intravenous Q8H  . pantoprazole (PROTONIX) IV  40 mg Intravenous Q12H  . potassium chloride  40 mEq Oral Q4H  . rosuvastatin  40 mg Oral Daily  . sodium chloride flush  10-40 mL Intracatheter Q12H   Continuous: . cefTRIAXone (ROCEPHIN)  IV Stopped (08/16/19 2259)  . dextrose 5 % and 0.9% NaCl 50 mL/hr at 08/17/19 0600  . levETIRAcetam Stopped (08/16/19 2208)   NGE:[id number] **OR** diphenhydrAMINE,  HYDROmorphone (DILAUDID) injection, labetalol, ondansetron, oxyCODONE, phenol, sodium chloride flush, zolpidem   Objective:  Vital Signs  Vitals:   08/17/19 0400 08/17/19 0500 08/17/19 0600 08/17/19 0800  BP: (!) 147/77 (!) 142/60 128/74   Pulse:      Resp: 19 18 15    Temp:   98.3 F (36.8 C) (!) 97.5 F (36.4 C)  TempSrc:   Oral Oral  SpO2: 96%     Weight:      Height:        Intake/Output Summary (Last 24 hours) at 08/17/2019 1029 Last data filed at 08/17/2019 0600 Gross per 24 hour  Intake 1446.57 ml  Output 1600 ml  Net -153.43 ml   Filed Weights   08/12/19 1041 08/12/19 2224  Weight: 106.1 kg 108.9 kg    General appearance: Awake alert.  In no distress Resp: Normal effort.  Diminished air entry at the bases.  No wheezing or rhonchi.   Cardio: S1-S2 is normal regular.  No S3-S4.  No rubs murmurs or bruit GI: Abdomen is soft.  Sluggish bowel  sounds.  Nontender.  No masses organomegaly.   Extremities: No edema.  Full range of motion of lower extremities. Neurologic: Alert and oriented x3.  No focal neurological deficits.       Lab Results:  Data Reviewed: I have personally reviewed following labs and imaging studies  CBC: Recent Labs  Lab 08/12/19 1112 08/12/19 1112 08/13/19 0513 08/14/19 0234 08/15/19 0821 08/16/19 0330 08/17/19 0743  WBC 8.3  --  4.8 5.3  --  7.5 7.1  NEUTROABS 5.6  --   --   --   --  5.3 4.9  HGB 11.6*   < > 9.3* 8.4* 9.0* 8.2* 8.5*  HCT 36.0   < > 29.7* 27.7* 28.8* 26.3* 27.8*  MCV 84.5  --  87.6 87.9  --  87.1 88.5  PLT 393  --  272 268  --  272 293   < > = values in this interval not displayed.    Basic Metabolic Panel: Recent Labs  Lab 08/13/19 0513 08/14/19 0234 08/15/19 0030 08/15/19 0821 08/16/19 0330 08/17/19 0743  NA 136 142 145  --  145 143  K 3.7 3.5 3.6  --  3.0* 3.0*  CL 105 110 117*  --  115* 112*  CO2 23 18* 17*  --  22 23  GLUCOSE 101* 76 69*  --  116* 122*  BUN 47* 51* 40*  --  29* 17  CREATININE  5.62* 4.67* 3.52*  --  3.01* 2.78*  CALCIUM 7.3* 7.8* 7.9*  --  8.4* 8.1*  MG  --   --   --  1.7  --   --     GFR: Estimated Creatinine Clearance: 24.8 mL/min (A) (by C-G formula based on SCr of 2.78 mg/dL (H)).  Liver Function Tests: Recent Labs  Lab 08/12/19 1112  AST 121*  ALT 194*  ALKPHOS 102  BILITOT 1.1  PROT 8.4*  ALBUMIN 3.3*    Coagulation Profile: Recent Labs  Lab 08/12/19 1132  INR 1.3*    CBG: Recent Labs  Lab 08/16/19 0544 08/16/19 1130 08/16/19 1732 08/16/19 2317 08/17/19 0618  GLUCAP 106* 102* 110* 114* 110*     Thyroid Function Tests: Recent Labs    08/15/19 0821  TSH 0.819      Recent Results (from the past 240 hour(s))  Urine Culture     Status: None   Collection Time: 08/12/19 11:18 AM   Specimen: Urine, Clean Catch  Result Value Ref Range Status   Specimen Description   Final    URINE, CLEAN CATCH Performed at [facility], [address], [place], [place] [address]    Special Requests   Final    NONE Performed at [facility], [address] [place], [place] [address]    Culture   Final    NO GROWTH Performed at [facility], [place] [address], [place], [place] [address]    Report Status 08/13/2019 FINAL  Final  Blood Culture (routine x 2)     Status: None   Collection Time: 08/12/19 11:32 AM   Specimen: BLOOD LEFT ARM  Result Value Ref Range Status   Specimen Description BLOOD LEFT ARM  Final   Special Requests   Final    BOTTLES DRAWN AEROBIC AND ANAEROBIC Blood Culture results may not be optimal due to an excessive volume of blood received in culture bottles   Culture   Final    NO GROWTH 5 DAYS Performed at [facility], [place]  [address] [place], [place] [address]    Report Status 08/17/2019 FINAL  Final  SARS Coronavirus 2 by RT PCR (hospital order, performed in [facility] hospital lab) Nasopharyngeal Nasopharyngeal Swab     Status: None   Collection Time: 08/12/19  1:41 PM   Specimen: Nasopharyngeal Swab    Result Value Ref Range Status   SARS Coronavirus 2 NEGATIVE NEGATIVE Final    Comment: (NOTE) SARS-CoV-2 target nucleic acids are NOT DETECTED.  The SARS-CoV-2 RNA is generally detectable in upper and lower respiratory specimens during the acute phase of infection. The lowest concentration of SARS-CoV-2 viral copies this assay can detect is 250 copies / mL. A negative result does not preclude SARS-CoV-2 infection and should not be used as the sole basis for treatment or other patient management decisions.  A negative result may occur with improper specimen collection / handling, submission of specimen other than nasopharyngeal swab, presence of viral mutation(s) within the areas targeted by this assay, and inadequate number of viral copies (<250 copies / mL). A negative result must be combined with clinical observations, patient history, and epidemiological information.  Fact Sheet for Patients:   StrictlyIdeas.no  Fact Sheet for Healthcare Providers: BankingDealers.co.za  This test is not yet approved or  cleared by the [place] FDA and has been authorized for detection and/or diagnosis of SARS-CoV-2 by FDA under an Emergency Use Authorization (EUA).  This EUA will remain in effect (meaning this test can be used) for the duration of the COVID-19 declaration under Section 564(b)(1) of the Act, 21 U.S.C. section 360bbb-3(b)(1), unless the authorization is terminated or revoked sooner.  Performed at [facility], [address], [place], [place] [address]   Surgical PCR screen     Status: None   Collection Time: 08/12/19  7:05 PM   Specimen: Nasal Mucosa; Nasal Swab  Result Value Ref Range Status   MRSA, PCR NEGATIVE NEGATIVE Final   Staphylococcus aureus NEGATIVE NEGATIVE Final    Comment: (NOTE) The Xpert SA Assay (FDA approved for NASAL specimens in patients 25 years of age and older), is one component of a  comprehensive surveillance program. It is not intended to diagnose infection nor to guide or monitor treatment. Performed at [facility], [place] [address] [place], [place] [address]       Radiology Studies: NM Pulmonary Perfusion  Result Date: 08/15/2019 CLINICAL DATA:  Chest pain. EXAM: NUCLEAR MEDICINE PERFUSION LUNG SCAN TECHNIQUE: Perfusion images were obtained in multiple projections after intravenous injection of radiopharmaceutical. Ventilation scans intentionally deferred if perfusion scan and chest x-ray adequate for interpretation during COVID 19 epidemic. RADIOPHARMACEUTICALS:  4.0 mCi Tc-30m MAA IV COMPARISON:  Radiograph of same day. FINDINGS: No definite perfusion defect is noted. IMPRESSION: No definite perfusion defect is noted. No definite evidence of pulmonary embolus is noted. Electronically Signed   By: Marijo Conception M.D.   On: 08/15/2019 18:48   VAS [place] LOWER EXTREMITY VENOUS (DVT)  Result Date: 08/15/2019  Lower Venous DVTStudy Indications: Swelling, and Edema.  Limitations: Body habitus and poor ultrasound/tissue interface. Comparison Study: no prior Performing Technologist: Abram Sander RVS  Examination Guidelines: A complete evaluation includes B-mode imaging, spectral Doppler, color Doppler, and power Doppler as needed of all accessible portions of each vessel. Bilateral testing is considered an integral part of a complete examination. Limited examinations for reoccurring indications may be performed as noted. The reflux portion of the exam is performed with the patient in reverse Trendelenburg.  +---------+---------------+---------+-----------+----------+--------------+ RIGHT  CompressibilityPhasicitySpontaneityPropertiesThrombus Aging +---------+---------------+---------+-----------+----------+--------------+ CFV      Full           Yes      Yes                                  +---------+---------------+---------+-----------+----------+--------------+ SFJ      Full                                                        +---------+---------------+---------+-----------+----------+--------------+ FV Prox  Full                                                        +---------+---------------+---------+-----------+----------+--------------+ FV Mid                  Yes      Yes                                 +---------+---------------+---------+-----------+----------+--------------+ FV Distal                                             Not visualized +---------+---------------+---------+-----------+----------+--------------+ PFV      Full                                                        +---------+---------------+---------+-----------+----------+--------------+ POP      Full           Yes      Yes                                 +---------+---------------+---------+-----------+----------+--------------+ PTV      Full                                                        +---------+---------------+---------+-----------+----------+--------------+ PERO                                                  Not visualized +---------+---------------+---------+-----------+----------+--------------+   +---------+---------------+---------+-----------+----------+--------------+ LEFT     CompressibilityPhasicitySpontaneityPropertiesThrombus Aging +---------+---------------+---------+-----------+----------+--------------+ CFV      Full           Yes      Yes                                 +---------+---------------+---------+-----------+----------+--------------+ SFJ  Full                                                        +---------+---------------+---------+-----------+----------+--------------+ FV Prox  Full                                                         +---------+---------------+---------+-----------+----------+--------------+ FV Mid   Full                                                        +---------+---------------+---------+-----------+----------+--------------+ FV Distal                                             Not visualized +---------+---------------+---------+-----------+----------+--------------+ PFV      Full                                                        +---------+---------------+---------+-----------+----------+--------------+ POP      Full           Yes      Yes                                 +---------+---------------+---------+-----------+----------+--------------+ PTV      Full                                                        +---------+---------------+---------+-----------+----------+--------------+ PERO                                                  Not visualized +---------+---------------+---------+-----------+----------+--------------+     Summary: BILATERAL: - No evidence of deep vein thrombosis seen in the lower extremities, bilaterally. - RIGHT: - A cystic structure is found in the popliteal fossa.  LEFT: - No cystic structure found in the popliteal fossa.  *See table(s) above for measurements and observations. Electronically signed by Ruta Hinds MD on 08/15/2019 at 3:36:46 PM.    Final        LOS: 5 days   [facility] Pager on [email or web address]  08/17/2019, 10:29 AM

## 2019-08-17 NOTE — Progress Notes (Signed)
The patient is receiving Protonix by the intravenous route.  Based on criteria approved by the Pharmacy and [place], the medication is being converted to the equivalent oral dose form.  These criteria include: -No active GI bleeding -Able to tolerate diet of full liquids (or better) or tube feeding -Able to tolerate other medications by the oral or enteral route  If you have any questions about this conversion, please contact the Pharmacy Department (phone [phone]).   Thank you.  Minda Ditto PharmD 08/17/2019, 2:15 PM

## 2019-08-17 NOTE — Progress Notes (Signed)
5 Days Post-Op   Subjective/Chief Complaint:   1 - Small Bowel Obstruction / Incisional Hernia - s/p emergent laparosopic hernia reduction / repair 08/12/19, day of admission, for high grade SBO. NPO with NGT post-op. Regaln started 6/21. Resumed bowel function 6/22 and clears started. Advanced to reg diet 6/23.   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 Ipsilateral cyst decortication benign.   3 - Acute on Chronic Renal Failure - Cr 6's with poor PO intake and ACEI on admit 6/18. CT no hydro / fluid collections. Baseline Cr 1.6's.   4 - Disposition / Rehab - independent at baseline, daughter [name] helps with errands. Will need HH at a minimum at DC.   Today "[name]" is continuing to make progress. Resmed BM yesterday and pain controlled. Tollerated clears yesterday afternnoon. GFR continues to trend better.    Objective: Vital signs in last 24 hours: Temp:  [97.5 F (36.4 C)-98.3 F (36.8 C)] 97.5 F (36.4 C) (06/23 0800) Pulse Rate:  [72-87] 87 (06/22 1600) Resp:  [12-26] 15 (06/23 0600) BP: (104-173)/(39-89) 128/74 (06/23 0600) SpO2:  [95 %-96 %] 96 % (06/23 0400) Last BM Date: 08/16/19  Intake/Output from previous day: 06/22 0701 - 06/23 0700 In: 1446.6 [I.V.:1147.4; IV Piggyback:299.2] Out: 1600 [Urine:1600] Intake/Output this shift: No intake/output data recorded.   General appearance: alert, cooperative  Eyes: negative Nose: Nares normal. Septum midline. Mucosa normal. No drainage or sinus tenderness. Throat: lips, mucosa, and tongue normal; teeth and gums normal Neck: thyroid not enlarged, symmetric, no tenderness/mass/nodules and EJ IV in place w/o site reaction.  Back: symmetric, no curvature. ROM normal. No CVA tenderness. Resp: non-labored on [place] O2.  Cardio: sinus with HR 80s by monitor.  GI: stable obesity and decreased distension. Recent port sites c/d/i.  Pelvic:  external genitalia normal and foley in place with non-foul urine.  Extremities: extremities normal, atraumatic, no cyanosis or edema Lymph nodes: Cervical, supraclavicular, and axillary nodes normal. Neurologic: Grossly normal  Incision/Wound: as per above.   Lab Results:  Recent Labs    08/16/19 0330 08/17/19 0743  WBC 7.5 7.1  HGB 8.2* 8.5*  HCT 26.3* 27.8*  PLT 272 293   BMET Recent Labs    08/16/19 0330 08/17/19 0743  NA 145 143  K 3.0* 3.0*  CL 115* 112*  CO2 22 23  GLUCOSE 116* 122*  BUN 29* 17  CREATININE 3.01* 2.78*  CALCIUM 8.4* 8.1*   PT/INR No results for input(s): LABPROT, INR in the last 72 hours. ABG No results for input(s): PHART, HCO3 in the last 72 hours.  Invalid input(s): PCO2, PO2  Studies/Results: NM Pulmonary Perfusion  Result Date: 08/15/2019 CLINICAL DATA:  Chest pain. EXAM: NUCLEAR MEDICINE PERFUSION LUNG SCAN TECHNIQUE: Perfusion images were obtained in multiple projections after intravenous injection of radiopharmaceutical. Ventilation scans intentionally deferred if perfusion scan and chest x-ray adequate for interpretation during COVID 19 epidemic. RADIOPHARMACEUTICALS:  4.0 mCi Tc-84m MAA IV COMPARISON:  Radiograph of same day. FINDINGS: No definite perfusion defect is noted. IMPRESSION: No definite perfusion defect is noted. No definite evidence of pulmonary embolus is noted. Electronically Signed   By: Marijo Conception M.D.   On: 08/15/2019 18:48   ECHOCARDIOGRAM COMPLETE  Result Date: 08/15/2019    ECHOCARDIOGRAM REPORT   Patient Name:   [name] [facility] Date of Exam: 08/15/2019 Medical Rec #:  [id number]  Height:       65.0 in Accession #:    [id number]      Weight:       240.1 lb Date of Birth:  12/14/1955       BSA:          2.138 m Patient Age:    64 years        BP:           124/36 mmHg Patient Gender: F               HR:           94 bpm. Exam Location:  Inpatient Procedure: 2D Echo, Cardiac Doppler and Color Doppler Indications:     I48.0 Paroxysmal atrial fibrillation  History:        Patient has no prior history of Echocardiogram examinations.                 Signs/Symptoms:Dyspnea; Risk Factors:Hypertension and                 Dyslipidemia. Thyroid Cancer. Seizures.  Sonographer:    Jonelle Sidle Dance Referring Phys: [id number] [place]  1. Hyperdynamic LV systolic function; elevated mean gradient across aortic valve (19 mmHg) suggests mild AS; however hyperdynamic LV function at least partially contributing; trace AI; mild TR with severely elevated pulmonary pressure.  2. Left ventricular ejection fraction, by estimation, is >75%. The left ventricle has hyperdynamic function. The left ventricle has no regional wall motion abnormalities. Left ventricular diastolic parameters were normal.  3. Right ventricular systolic function is normal. The right ventricular size is normal. There is severely elevated pulmonary artery systolic pressure.  4. The mitral valve is normal in structure. Trivial mitral valve regurgitation. No evidence of mitral stenosis.  5. The aortic valve is tricuspid. Aortic valve regurgitation is trivial. Mild aortic valve stenosis.  6. The inferior vena cava is normal in size with greater than 50% respiratory variability, suggesting right atrial pressure of 3 mmHg. FINDINGS  Left Ventricle: Left ventricular ejection fraction, by estimation, is >75%. The left ventricle has hyperdynamic function. The left ventricle has no regional wall motion abnormalities. The left ventricular internal cavity size was normal in size. There is no left ventricular hypertrophy. Left ventricular diastolic parameters were normal. Right Ventricle: The right ventricular size is normal. Right ventricular systolic function is normal. There is severely elevated pulmonary artery systolic pressure. The tricuspid regurgitant velocity is 3.89 m/s, and with an assumed right atrial pressure  of 3 mmHg, the estimated right ventricular systolic  pressure is 47.0 mmHg. Left Atrium: Left atrial size was normal in size. Right Atrium: Right atrial size was normal in size. Pericardium: There is no evidence of pericardial effusion. Mitral Valve: The mitral valve is normal in structure. Normal mobility of the mitral valve leaflets. Trivial mitral valve regurgitation. No evidence of mitral valve stenosis. Tricuspid Valve: The tricuspid valve is normal in structure. Tricuspid valve regurgitation is mild . No evidence of tricuspid stenosis. Aortic Valve: The aortic valve is tricuspid. Aortic valve regurgitation is trivial. Mild aortic stenosis is present. Aortic valve mean gradient measures 16.0 mmHg. Aortic valve peak gradient measures 32.6 mmHg. Aortic valve area, by VTI measures 1.84 cm. Pulmonic Valve: The pulmonic valve was not well visualized. Pulmonic valve regurgitation is not visualized. No evidence of pulmonic stenosis. Aorta: The aortic root is normal in size and structure. Venous: The inferior vena cava is normal in size with greater than 50% respiratory variability,  suggesting right atrial pressure of 3 mmHg. IAS/Shunts: No atrial level shunt detected by color flow Doppler. Additional Comments: Hyperdynamic LV systolic function; elevated mean gradient across aortic valve (19 mmHg) suggests mild AS; however hyperdynamic LV function at least partially contributing; trace AI; mild TR with severely elevated pulmonary pressure.  LEFT VENTRICLE PLAX 2D LVIDd:         3.70 cm  Diastology LVIDs:         2.20 cm  LV e' lateral:   14.10 cm/s LV PW:         1.10 cm  LV E/e' lateral: 5.6 LV IVS:        0.70 cm  LV e' medial:    10.00 cm/s LVOT diam:     1.80 cm  LV E/e' medial:  7.8 LV SV:         92 LV SV Index:   43 LVOT Area:     2.54 cm  RIGHT VENTRICLE             IVC RV Basal diam:  2.80 cm     IVC diam: 1.50 cm RV S prime:     23.30 cm/s TAPSE (M-mode): 2.0 cm LEFT ATRIUM             Index       RIGHT ATRIUM           Index LA diam:        2.70 cm 1.26  cm/m  RA Area:     12.00 cm LA Vol (A2C):   55.4 ml 25.91 ml/m RA Volume:   25.50 ml  11.93 ml/m LA Vol (A4C):   56.5 ml 26.43 ml/m LA Biplane Vol: 59.3 ml 27.74 ml/m  AORTIC VALVE AV Area (Vmax):    1.65 cm AV Area (Vmean):   1.68 cm AV Area (VTI):     1.84 cm AV Vmax:           285.33 cm/s AV Vmean:          185.000 cm/s AV VTI:            0.500 m AV Peak Grad:      32.6 mmHg AV Mean Grad:      16.0 mmHg LVOT Vmax:         185.00 cm/s LVOT Vmean:        122.000 cm/s LVOT VTI:          0.361 m LVOT/AV VTI ratio: 0.72  AORTA Ao Root diam: 3.00 cm Ao Asc diam:  3.40 cm MITRAL VALVE               TRICUSPID VALVE MV Area (PHT): 3.17 cm    TR Peak grad:   60.5 mmHg MV Decel Time: 239 msec    TR Vmax:        389.00 cm/s MV E velocity: 78.50 cm/s MV A velocity: 92.70 cm/s  SHUNTS MV E/A ratio:  0.85        Systemic VTI:  0.36 m                            Systemic Diam: 1.80 cm Kirk Ruths MD Electronically signed by Kirk Ruths MD Signature Date/Time: 08/15/2019/12:23:06 PM    Final    VAS [place] LOWER EXTREMITY VENOUS (DVT)  Result Date: 08/15/2019  Lower Venous DVTStudy Indications: Swelling, and Edema.  Limitations: Body habitus and poor ultrasound/tissue interface. Comparison Study:  no prior Performing Technologist: Abram Sander RVS  Examination Guidelines: A complete evaluation includes B-mode imaging, spectral Doppler, color Doppler, and power Doppler as needed of all accessible portions of each vessel. Bilateral testing is considered an integral part of a complete examination. Limited examinations for reoccurring indications may be performed as noted. The reflux portion of the exam is performed with the patient in reverse Trendelenburg.  +---------+---------------+---------+-----------+----------+--------------+ RIGHT    CompressibilityPhasicitySpontaneityPropertiesThrombus Aging +---------+---------------+---------+-----------+----------+--------------+ CFV      Full           Yes       Yes                                 +---------+---------------+---------+-----------+----------+--------------+ SFJ      Full                                                        +---------+---------------+---------+-----------+----------+--------------+ FV Prox  Full                                                        +---------+---------------+---------+-----------+----------+--------------+ FV Mid                  Yes      Yes                                 +---------+---------------+---------+-----------+----------+--------------+ FV Distal                                             Not visualized +---------+---------------+---------+-----------+----------+--------------+ PFV      Full                                                        +---------+---------------+---------+-----------+----------+--------------+ POP      Full           Yes      Yes                                 +---------+---------------+---------+-----------+----------+--------------+ PTV      Full                                                        +---------+---------------+---------+-----------+----------+--------------+ PERO                                                  Not visualized +---------+---------------+---------+-----------+----------+--------------+   +---------+---------------+---------+-----------+----------+--------------+  LEFT     CompressibilityPhasicitySpontaneityPropertiesThrombus Aging +---------+---------------+---------+-----------+----------+--------------+ CFV      Full           Yes      Yes                                 +---------+---------------+---------+-----------+----------+--------------+ SFJ      Full                                                        +---------+---------------+---------+-----------+----------+--------------+ FV Prox  Full                                                         +---------+---------------+---------+-----------+----------+--------------+ FV Mid   Full                                                        +---------+---------------+---------+-----------+----------+--------------+ FV Distal                                             Not visualized +---------+---------------+---------+-----------+----------+--------------+ PFV      Full                                                        +---------+---------------+---------+-----------+----------+--------------+ POP      Full           Yes      Yes                                 +---------+---------------+---------+-----------+----------+--------------+ PTV      Full                                                        +---------+---------------+---------+-----------+----------+--------------+ PERO                                                  Not visualized +---------+---------------+---------+-----------+----------+--------------+     Summary: BILATERAL: - No evidence of deep vein thrombosis seen in the lower extremities, bilaterally. - RIGHT: - A cystic structure is found in the popliteal fossa.  LEFT: - No cystic structure found in the popliteal fossa.  *See table(s) above for measurements and observations. Electronically signed by Ruta Hinds MD on 08/15/2019 at 3:36:46 PM.    Final  Anti-infectives: Anti-infectives (From admission, onward)   Start     Dose/Rate Route Frequency Ordered Stop   08/12/19 2300  cefTRIAXone (ROCEPHIN) 2 g in sodium chloride 0.9 % 100 mL IVPB     Discontinue     2 g 200 mL/hr over 30 Minutes Intravenous Daily at bedtime 08/12/19 2226     08/12/19 1145  cefTRIAXone (ROCEPHIN) 1 g in sodium chloride 0.9 % 100 mL IVPB        1 g 200 mL/hr over 30 Minutes Intravenous  Once 08/12/19 1140 08/12/19 1320      Assessment/Plan:  1 - Small Bowel Obstruction / Incisional Hernia - doing satisfactory POD 5. Adv to reg diet.   2 -  Right Renal Cancer  - good prognosis. NO further cancer directed therapy this admission.   3 - Acute on Chronic Renal Failure - likely severe pre-renal. Improving with hydraiton. Appreciate nephrol co-managent. DC foley today.   4 - Disposition / Rehab - PT eval for help on DC  / DME planning. Will need home with [facility] v. SNF.   Transfer med-surg floor.   [name] 08/17/2019

## 2019-08-18 DIAGNOSIS — I480 Paroxysmal atrial fibrillation: Secondary | ICD-10-CM | POA: Diagnosis not present

## 2019-08-18 DIAGNOSIS — I10 Essential (primary) hypertension: Secondary | ICD-10-CM | POA: Diagnosis not present

## 2019-08-18 DIAGNOSIS — I272 Pulmonary hypertension, unspecified: Secondary | ICD-10-CM | POA: Diagnosis not present

## 2019-08-18 DIAGNOSIS — E039 Hypothyroidism, unspecified: Secondary | ICD-10-CM | POA: Diagnosis not present

## 2019-08-18 LAB — CBC WITH DIFFERENTIAL/PLATELET
Abs Immature Granulocytes: 0.15 10*3/uL — ABNORMAL HIGH (ref 0.00–0.07)
Basophils Absolute: 0 10*3/uL (ref 0.0–0.1)
Basophils Relative: 1 %
Eosinophils Absolute: 0.2 10*3/uL (ref 0.0–0.5)
Eosinophils Relative: 2 %
HCT: 29.5 % — ABNORMAL LOW (ref 36.0–46.0)
Hemoglobin: 9.2 g/dL — ABNORMAL LOW (ref 12.0–15.0)
Immature Granulocytes: 2 %
Lymphocytes Relative: 20 %
Lymphs Abs: 1.7 10*3/uL (ref 0.7–4.0)
MCH: 27.2 pg (ref 26.0–34.0)
MCHC: 31.2 g/dL (ref 30.0–36.0)
MCV: 87.3 fL (ref 80.0–100.0)
Monocytes Absolute: 0.9 10*3/uL (ref 0.1–1.0)
Monocytes Relative: 11 %
Neutro Abs: 5.6 10*3/uL (ref 1.7–7.7)
Neutrophils Relative %: 64 %
Platelets: 321 10*3/uL (ref 150–400)
RBC: 3.38 MIL/uL — ABNORMAL LOW (ref 3.87–5.11)
RDW: 15.7 % — ABNORMAL HIGH (ref 11.5–15.5)
WBC: 8.6 10*3/uL (ref 4.0–10.5)
nRBC: 0 % (ref 0.0–0.2)

## 2019-08-18 LAB — GLUCOSE, CAPILLARY
Glucose-Capillary: 110 mg/dL — ABNORMAL HIGH (ref 70–99)
Glucose-Capillary: 112 mg/dL — ABNORMAL HIGH (ref 70–99)
Glucose-Capillary: 98 mg/dL (ref 70–99)
Glucose-Capillary: 99 mg/dL (ref 70–99)

## 2019-08-18 LAB — BASIC METABOLIC PANEL
Anion gap: 10 (ref 5–15)
BUN: 11 mg/dL (ref 8–23)
CO2: 20 mmol/L — ABNORMAL LOW (ref 22–32)
Calcium: 8.3 mg/dL — ABNORMAL LOW (ref 8.9–10.3)
Chloride: 113 mmol/L — ABNORMAL HIGH (ref 98–111)
Creatinine, Ser: 2.87 mg/dL — ABNORMAL HIGH (ref 0.44–1.00)
GFR calc Af Amer: 19 mL/min — ABNORMAL LOW (ref >60)
GFR calc non Af Amer: 17 mL/min — ABNORMAL LOW (ref >60)
Glucose, Bld: 112 mg/dL — ABNORMAL HIGH (ref 70–99)
Potassium: 3.5 mmol/L (ref 3.5–5.1)
Sodium: 143 mmol/L (ref 135–145)

## 2019-08-18 MED ORDER — APIXABAN 5 MG PO TABS
5.0000 mg | ORAL_TABLET | Freq: Two times a day (BID) | ORAL | Status: DC
  Administered 2019-08-18 – 2019-08-19 (×4): 5 mg via ORAL
  Filled 2019-08-18 (×4): qty 1

## 2019-08-18 MED ORDER — POTASSIUM CHLORIDE CRYS ER 20 MEQ PO TBCR
40.0000 meq | EXTENDED_RELEASE_TABLET | Freq: Once | ORAL | Status: AC
  Administered 2019-08-18: 40 meq via ORAL
  Filled 2019-08-18: qty 2

## 2019-08-18 NOTE — Progress Notes (Signed)
ANTICOAGULATION CONSULT NOTE - Initial Consult  Pharmacy Consult for Apixaban Indication: atrial fibrillation  Allergies  Allergen Reactions  . Simvastatin Other (See Comments)    Muscle aches    Patient Measurements: Height: 5\' 5"  (165.1 cm) Weight: 108.9 kg (240 lb 1.3 oz) IBW/kg (Calculated) : 57  Vital Signs: Temp: 98.5 F (36.9 C) (06/24 0800) Temp Source: Oral (06/24 0800) BP: 130/52 (06/24 0900) Pulse Rate: 77 (06/24 0900)  Labs: Recent Labs    08/16/19 0330 08/16/19 0330 08/17/19 0743 08/18/19 0216  HGB 8.2*   < > 8.5* 9.2*  HCT 26.3*  --  27.8* 29.5*  PLT 272  --  293 321  CREATININE 3.01*  --  2.78* 2.87*   < > = values in this interval not displayed.    Estimated Creatinine Clearance: 24 mL/min (A) (by C-G formula based on SCr of 2.87 mg/dL (H)).  33 ml/min using total body weight   Medical History: Past Medical History:  Diagnosis Date  . Depression   . Dyspnea   . Fractures   . Hyperlipidemia   . Hypertension   . Mental retardation, mild (I.Q. 50-70)    lives alone and cares for self has a nurse aide comes once a day   . Obesity   . Pneumonia   . PONV (postoperative nausea and vomiting)   . Seizure ([place])    11/17/2012 "still having them; "I believe their from a tumor in my head" ((11/17/2012  . Seizure disorder ([place])   . Thyroid cancer ([employer])     Medications:  Scheduled:  . Chlorhexidine Gluconate Cloth  6 each Topical Daily  . diltiazem  300 mg Oral Daily  . ezetimibe  10 mg Oral Daily  . FLUoxetine  10 mg Oral Daily  . lamoTRIgine  75 mg Oral BID  . levETIRAcetam  1,500 mg Oral BID  . levothyroxine  137 mcg Oral Q0600  . mouth rinse  15 mL Mouth Rinse BID  . metoCLOPramide (REGLAN) injection  5 mg Intravenous Q8H  . pantoprazole  40 mg Oral BID  . potassium chloride  40 mEq Oral Once  . rosuvastatin  40 mg Oral Daily  . sodium chloride flush  10-40 mL Intracatheter Q12H   Infusions:  . cefTRIAXone (ROCEPHIN)  IV Stopped (08/17/19  2142)   PRN: diphenhydrAMINE **OR** diphenhydrAMINE, HYDROmorphone (DILAUDID) injection, labetalol, ondansetron, oxyCODONE, phenol, sodium chloride flush, zolpidem  Assessment: 64 yo female with SBO secondary to incisional hernia s/p repair 6/18. Pharmacy consulted to begin apixaban for new afib with RVR. CHA2DS2VASc = 3  Hgb 9.2 Age < 80 Weight > 60k SCr > 1.5 with hx CKDIII, admit with AKI - SCr improving  Goal of Therapy:  Therapeutic anticoagulation Prevention of stroke   Plan:  Begin apixaban 5mg  BID Provide education and 30 day trial coupon prior to discharge Pharmacy will sign-off, please reconsult if questions  Peggyann Juba, PharmD, BCPS Pharmacy: [phone] 08/18/2019,9:55 AM

## 2019-08-18 NOTE — Progress Notes (Signed)
6 Days Post-Op   Subjective/Chief Complaint:   1 - Small Bowel Obstruction / Incisional Hernia - s/p emergent laparosopic hernia reduction / repair 08/12/19, day of admission, for high grade SBO. NPO with NGT post-op. Regaln started 6/21. Resumed bowel function 6/22 and clears started. Advanced to reg diet 6/23.   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 Ipsilateral cyst decortication benign.   3 - Acute on Chronic Renal Failure - Cr 6's with poor PO intake and ACEI on admit 6/18. CT no hydro / fluid collections. Baseline Cr 1.6's.   4 - Disposition / Rehab - independent at baseline, daughter [name] helps with errands. Will need HH at a minimum at DC.   Today "[name]" is stable. Continuing to tolerate PO w/o emesis. Main ongoing issue is DC planning.    Objective: Vital signs in last 24 hours: Temp:  [97.5 F (36.4 C)-98.4 F (36.9 C)] 98.2 F (36.8 C) (06/24 0600) Pulse Rate:  [62-130] 72 (06/24 0407) Resp:  [13-22] 15 (06/24 0407) BP: (117-173)/(58-146) 164/89 (06/24 0407) SpO2:  [89 %-100 %] 100 % (06/24 0407) Last BM Date: 08/16/19  Intake/Output from previous day: 06/23 0701 - 06/24 0700 In: 1051.5 [P.O.:480; I.V.:494.3; IV Piggyback:77.2] Out: 600 [Urine:600] Intake/Output this shift: No intake/output data recorded.   General appearance: alert, cooperative  Eyes: negative Nose: Nares normal. Septum midline. Mucosa normal. No drainage or sinus tenderness. Throat: lips, mucosa, and tongue normal; teeth and gums normal Neck: thyroid not enlarged, symmetric, no tenderness/mass/nodules and old Rt neck line dressing c/d/i, no hematomas.  Back: symmetric, no curvature. ROM normal. No CVA tenderness. Resp: non-labored on [place] O2.  Cardio: sinus with HR mid 60s by monitor.  GI: stable obesity and decreased distension. Recent port sites c/d/i.  Pelvic: external genitalia normal and  foley in place with non-foul urine.  Extremities: extremities normal, atraumatic, no cyanosis or edema Lymph nodes: Cervical, supraclavicular, and axillary nodes normal. Neurologic: Grossly normal  Incision/Wound: as per above.   Lab Results:  Recent Labs    08/17/19 0743 08/18/19 0216  WBC 7.1 8.6  HGB 8.5* 9.2*  HCT 27.8* 29.5*  PLT 293 321   BMET Recent Labs    08/17/19 0743 08/18/19 0216  NA 143 143  K 3.0* 3.5  CL 112* 113*  CO2 23 20*  GLUCOSE 122* 112*  BUN 17 11  CREATININE 2.78* 2.87*  CALCIUM 8.1* 8.3*   PT/INR No results for input(s): LABPROT, INR in the last 72 hours. ABG No results for input(s): PHART, HCO3 in the last 72 hours.  Invalid input(s): PCO2, PO2  Studies/Results: No results found.  Anti-infectives: Anti-infectives (From admission, onward)   Start     Dose/Rate Route Frequency Ordered Stop   08/12/19 2300  cefTRIAXone (ROCEPHIN) 2 g in sodium chloride 0.9 % 100 mL IVPB     Discontinue     2 g 200 mL/hr over 30 Minutes Intravenous Daily at bedtime 08/12/19 2226     08/12/19 1145  cefTRIAXone (ROCEPHIN) 1 g in sodium chloride 0.9 % 100 mL IVPB        1 g 200 mL/hr over 30 Minutes Intravenous  Once 08/12/19 1140 08/12/19 1320      Assessment/Plan:  1 - Small Bowel Obstruction / Incisional Hernia - doing satisfactory. NO signs of recurrent obstruction. Continue reg diet.   2 - Right Renal Cancer  - good prognosis.  NO further cancer directed therapy this admission.   3 - Acute on Chronic Renal Failure - likely severe pre-renal. Improving with hydraiton. Appreciate nephrol co-managent. I suspect she is nearing new baseline.   4 - Disposition / Rehab - Discussed importance of repeat PT opinion as pt likely DC as soon as tomorrow with [facility] or SNF.   [name] 08/18/2019

## 2019-08-18 NOTE — TOC Progression Note (Addendum)
Transition of Care [facility]) - Progression Note    Patient Details  Name: [name] MRN: [id number] Date of Birth: 11-11-1955  Transition of Care [facility]) CM/SW Contact  Leeroy Cha, RN Phone Number: 08/18/2019, 2:57 PM  Clinical Narrative:    TCF Dr. Tresa Moore will need to go to snf.  Medicaid is primary will start fl2 and passar for search. Pt is from the [place] area [place] faxed out to [employer] and [place] areas for review.  Expected Discharge Plan: Home/Self Care Barriers to Discharge: Continued Medical Work up  Expected Discharge Plan and Services Expected Discharge Plan: Home/Self Care       Living arrangements for the past 2 months: Apartment                                       Social Determinants of Health (SDOH) Interventions    Readmission Risk Interventions No flowsheet data found.

## 2019-08-18 NOTE — NC FL2 (Signed)
[place] LEVEL OF CARE SCREENING TOOL     IDENTIFICATION  Patient Name: [name] Birthdate: 12-08-1955 Sex: female Admission Date (Current Location): 08/12/2019  [employer] and [place] Number:  Herbalist and Address:  [facility],  [place] [address] [place]      Provider Number: [id number]  Attending Physician Name and Address:  [name], MD  Relative Name and Phone Number:       Current Level of Care: Hospital Recommended Level of Care: [place] Prior Approval Number:    Date Approved/Denied:   PASRR Number: [id number]  Discharge Plan: SNF    Current Diagnoses: Patient Active Problem List   Diagnosis Date Noted  . Atrial fibrillation with RVR ([place]) 08/14/2019  . Hypothyroidism   . Incisional hernia with gangrene and obstruction 08/13/2019  . Renal mass 08/05/2019  . Dysuria 05/10/2019  . Other specified disorders of kidney and ureter 05/10/2019  . Renal mass, right 05/10/2019  . Encounter for examination following treatment at hospital 05/10/2019  . Encounter for vaccination 05/10/2019  . Tachycardia 07/04/2018  . Acute renal failure superimposed on stage 3 chronic kidney disease ([place])   . Depression, major, single episode, mild ([place]) 03/21/2018  . CKD (chronic kidney disease) stage 3, GFR 30-59 ml/min 04/03/2016  . GERD (gastroesophageal reflux disease) 08/23/2015  . Osteoarthritis of right knee 08/15/2014  . 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 Reduced vision 05/03/2013  . Papillary thyroid carcinoma ([place]) 04/12/2013  . Vitamin D deficiency 08/16/2009  . POLYNEUROPATHY 04/10/2009  . Hyperlipidemia LDL goal <100 07/15/2007  . Morbid obesity ([place]) 07/15/2007  . 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 Essential hypertension 07/15/2007  . Seizures ([place]) 07/15/2007    Orientation RESPIRATION BLADDER Height & Weight     Self, Time, Situation, Place  Normal Continent  Weight: 108.9 kg Height:  5\' 5"  (165.1 cm)  BEHAVIORAL SYMPTOMS/MOOD NEUROLOGICAL BOWEL NUTRITION STATUS      Continent Diet (regular)  AMBULATORY STATUS COMMUNICATION OF NEEDS Skin   Extensive Assist Verbally Normal                       Personal Care Assistance Level of Assistance  Bathing, Feeding, Dressing Bathing Assistance: Limited assistance Feeding assistance: Limited assistance Dressing Assistance: Limited assistance     Functional Limitations Info  Sight, Speech, Hearing Sight Info: Adequate Hearing Info: Adequate Speech Info: Adequate    SPECIAL CARE FACTORS FREQUENCY  PT (By licensed PT), OT (By licensed OT)     PT Frequency: 5x weekly OT Frequency: 5 x weekly            Contractures Contractures Info: Not present    Additional Factors Info  Code Status Code Status Info: full             Current Medications (08/18/2019):  This is the current hospital active medication list Current Facility-Administered Medications  Medication Dose Route Frequency Provider Last Rate Last Admin  . apixaban (ELIQUIS) tablet 5 mg  5 mg Oral BID Emiliano Dyer, RPH   5 mg at 08/18/19 1026  . cefTRIAXone (ROCEPHIN) 2 g in sodium chloride 0.9 % 100 mL IVPB  2 g Intravenous QHS Lucas Mallow, MD   Stopped at 08/17/19 2138  . Chlorhexidine Gluconate Cloth 2 % PADS 6 each  6 each Topical Daily Marton Redwood III, MD   6 each at 08/17/19 2328  . diltiazem (CARDIZEM CD) 24 hr  capsule 300 mg  300 mg Oral Daily Bonnielee Haff, MD   300 mg at 08/18/19 1015  . diphenhydrAMINE (BENADRYL) injection 12.5 mg  12.5 mg Intravenous Q6H PRN Marton Redwood III, MD   12.5 mg at 08/17/19 2106   Or  . diphenhydrAMINE (BENADRYL) 12.5 MG/5ML elixir 12.5 mg  12.5 mg Oral Q6H PRN Marton Redwood III, MD      . ezetimibe (ZETIA) tablet 10 mg  10 mg Oral Daily Marton Redwood III, MD   10 mg at 08/18/19 1020  . FLUoxetine (PROZAC) capsule 10 mg  10 mg Oral Daily Bonnielee Haff, MD   10 mg  at 08/18/19 1019  . HYDROmorphone (DILAUDID) injection 0.5-1 mg  0.5-1 mg Intravenous Q2H PRN Marton Redwood III, MD   1 mg at 08/18/19 1511  . labetalol (NORMODYNE) injection 10 mg  10 mg Intravenous Q2H PRN Bonnielee Haff, MD   10 mg at 08/16/19 0104  . lamoTRIgine (LAMICTAL) tablet 75 mg  75 mg Oral BID Bonnielee Haff, MD   75 mg at 08/18/19 1016  . levETIRAcetam (KEPPRA) tablet 1,500 mg  1,500 mg Oral BID Bonnielee Haff, MD   1,500 mg at 08/18/19 1017  . levothyroxine (SYNTHROID) tablet 137 mcg  137 mcg Oral Q0600 Marton Redwood III, MD   137 mcg at 08/18/19 [phone]  . MEDLINE mouth rinse  15 mL Mouth Rinse BID Marton Redwood III, MD   15 mL at 08/18/19 1022  . metoCLOPramide (REGLAN) injection 5 mg  5 mg Intravenous Q8H [name], MD   5 mg at 08/18/19 1318  . ondansetron (ZOFRAN) injection 4 mg  4 mg Intravenous Q4H PRN Marton Redwood III, MD   4 mg at 08/18/19 0756  . oxyCODONE (Oxy IR/ROXICODONE) immediate release tablet 5 mg  5 mg Oral Q4H PRN Marton Redwood III, MD   5 mg at 08/17/19 2058  . pantoprazole (PROTONIX) EC tablet 40 mg  40 mg Oral BID Minda Ditto, RPH   40 mg at 08/18/19 1019  . phenol (CHLORASEPTIC) mouth spray 1 spray  1 spray Mouth/Throat PRN Festus Aloe, MD   1 spray at 08/18/19 1311  . rosuvastatin (CRESTOR) tablet 40 mg  40 mg Oral Daily Marton Redwood III, MD   40 mg at 08/18/19 1018  . sodium chloride flush (NS) 0.9 % injection 10-40 mL  10-40 mL Intracatheter Q12H Marton Redwood III, MD   10 mL at 08/18/19 1023  . sodium chloride flush (NS) 0.9 % injection 10-40 mL  10-40 mL Intracatheter PRN Marton Redwood III, MD      . zolpidem (AMBIEN) tablet 5 mg  5 mg Oral QHS PRN Marton Redwood III, MD   5 mg at 08/18/19 0012     Discharge Medications: Please see discharge summary for a list of discharge medications.  Relevant Imaging Results:  Relevant Lab Results:   Additional Information [id number]  Leeroy Cha, RN

## 2019-08-18 NOTE — Progress Notes (Signed)
Dr. Tresa Moore paged by this RN in regards to abdominal distention and c/o nausea. PRN zofran given, with little relief. No new orders at this time; continue PRN zofran and scheduled reglan. This RN will continue to carefully assess pt.

## 2019-08-18 NOTE — Progress Notes (Signed)
Physical Therapy Treatment Patient Details Name: [name] MRN: [id number] DOB: Jul 21, 1955 Today's Date: 08/18/2019    History of Present Illness [name] is an 64 y.o. female with history of hypertension, seizure disorder, hyperlipidemia, chronic kidney disease stage IIIb, papillary thyroid carcinoma status post thyroidectomy, and and right renal mass status post laparoscopic partial nephrectomy on 08/05/2019 who returned to the emergency department on 08/12/2019 with 2 to 3 days of nausea and vomiting, was found to have an acute kidney injury and partial SBO secondary to incisional hernia, s/p emergent laparosopic hernia reduction / repair 08/12/19, day of admission, for high grade SBO. Onset afib/RVR. POd 2.    PT Comments    Patient's progress is slow, tolerating OOB to recliner. Today reporting pain is "10" after medication. Patient is unsteady, tends to lean posteriorly when standing. Currently requiring 2 to assist for transfers. May benefit from post acute rehab. Continue PT for mobility.   Follow Up Recommendations  Home health PT; 24/7 vs.SNF     Equipment Recommendations  3in1 (PT)    Recommendations for Other Services       Precautions / Restrictions Precautions Precautions: Fall Precaution Comments: leans backward    Mobility  Bed Mobility   Bed Mobility: Supine to Sit     Supine to sit: Min guard     General bed mobility comments: extra time to push up to sit, no assistance  Transfers Overall transfer level: Needs assistance Equipment used: 2 person hand held assist Transfers: Sit to/from Stand Sit to Stand: Mod assist;+2 physical assistance;+2 safety/equipment Stand pivot transfers: Mod assist;+2 physical assistance;+2 safety/equipment       General transfer comment: patient with wide base, gradually placed feet closer, posterior lean and difficuly moving feet to  move to recliner, ataxic like/festinating like.   cues to reach back to  recliner  Ambulation/Gait                 Stairs             Wheelchair Mobility    Modified Rankin (Stroke Patients Only)       Balance Overall balance assessment: Needs assistance Sitting-balance support: Feet supported;No upper extremity supported Sitting balance-Leahy Scale: Fair     Standing balance support: During functional activity;Single extremity supported Standing balance-Leahy Scale: Poor Standing balance comment: requires support, posterior lean                            Cognition Arousal/Alertness: Lethargic;Suspect due to medications Behavior During Therapy: [facility] for tasks assessed/performed Overall Cognitive Status: Within Functional Limits for tasks assessed                                 General Comments: appears in more discomfort today.      Exercises General Exercises - Lower Extremity Ankle Circles/Pumps: AROM;10 reps;Both Long Arc Quad: AROM;10 reps;Seated;Both Hip ABduction/ADduction: AROM;10 reps;Seated;Both Hip Flexion/Marching: AROM;10 reps;Seated;Both    General Comments        Pertinent Vitals/Pain Pain Assessment: 0-10 Pain Score: 10-Worst pain ever Pain Descriptors / Indicators: Cramping;Discomfort Pain Intervention(s): Monitored during session;Premedicated before session;Limited activity within patient's tolerance    Home Living                      Prior Function            PT Goals (  current goals can now be found in the care plan section) Progress towards PT goals: Progressing toward goals    Frequency    Min 2X/week      PT Plan Discharge plan needs to be updated;Frequency needs to be updated    Co-evaluation              AM-PAC PT "6 Clicks" Mobility   Outcome Measure  Help needed turning from your back to your side while in a flat bed without using bedrails?: A Little Help needed moving from lying on your back to sitting on the side of a flat bed  without using bedrails?: A Little Help needed moving to and from a bed to a chair (including a wheelchair)?: A Lot Help needed standing up from a chair using your arms (e.g., wheelchair or bedside chair)?: A Lot Help needed to walk in hospital room?: Total Help needed climbing 3-5 steps with a railing? : Total 6 Click Score: 12    End of Session   Activity Tolerance: Patient limited by pain Patient left: in chair;with call bell/phone within reach;with chair alarm set Nurse Communication: Mobility status PT Visit Diagnosis: Unsteadiness on feet (R26.81);Difficulty in walking, not elsewhere classified (R26.2);Muscle weakness (generalized) (M62.81)     Time: 7989-2119 PT Time Calculation (min) (ACUTE ONLY): 15 min  Charges:  $Therapeutic Activity: 8-22 mins                     Tresa Endo PT Acute Rehabilitation Services Pager [phone] Office [phone]    Claretha Cooper 08/18/2019, 12:56 PM

## 2019-08-18 NOTE — Progress Notes (Signed)
[facility] PROGRESS NOTE   [name] VHQ:[id number] DOB: May 23, 1955 DOA: 08/12/2019  PCP: Christine Helper, MD  Brief History/Interval Summary: 64 y.o. female with history of hypertension, seizure disorder, hyperlipidemia, chronic kidney disease stage IIIb, papillary thyroid carcinoma status post thyroidectomy, and and right renal mass status post laparoscopic partial nephrectomy on 08/05/2019 who returned to the emergency department on 08/12/2019 with 2 to 3 days of nausea and vomiting, was found to have an acute kidney injury and partial SBO secondary to incisional hernia.  Patient was admitted to the urology service and taken to the operating room that same day for port site incisional hernia repair.  On the evening of 6/28 patient was found to have heart rate in the 1 30-1 40 range.  EKG showed atrial fibrillation with RVR.  Patient was transferred to the ICU.  Hospitalist was consulted.   Reason for Visit: Atrial fibrillation with RVR  Procedures:  Transthoracic echocardiogram 6/21 1. Hyperdynamic LV systolic function; elevated mean gradient across  aortic valve (19 mmHg) suggests mild AS; however hyperdynamic LV function  at least partially contributing; trace AI; mild TR with severely elevated  pulmonary pressure.  2. Left ventricular ejection fraction, by estimation, is >75%. The left  ventricle has hyperdynamic function. The left ventricle has no regional  wall motion abnormalities. Left ventricular diastolic parameters were  normal.  3. Right ventricular systolic function is normal. The right ventricular  size is normal. There is severely elevated pulmonary artery systolic  pressure.  4. The mitral valve is normal in structure. Trivial mitral valve  regurgitation. No evidence of mitral stenosis.  5. The aortic valve is tricuspid. Aortic valve regurgitation is trivial.  Mild aortic valve stenosis.  6. The inferior vena cava is normal in size with greater  than 50%  respiratory variability, suggesting right atrial pressure of 3 mmHg.   Antibiotics: Anti-infectives (From admission, onward)   Start     Dose/Rate Route Frequency Ordered Stop   08/12/19 2300  cefTRIAXone (ROCEPHIN) 2 g in sodium chloride 0.9 % 100 mL IVPB     Discontinue     2 g 200 mL/hr over 30 Minutes Intravenous Daily at bedtime 08/12/19 2226     08/12/19 1145  cefTRIAXone (ROCEPHIN) 1 g in sodium chloride 0.9 % 100 mL IVPB        1 g 200 mL/hr over 30 Minutes Intravenous  Once 08/12/19 1140 08/12/19 1320      Subjective/Interval History: Patient denies any chest pain shortness of breath.  Is passing gas from below.  Does mention some abdominal discomfort at times.  No other events noted.  Urinating well.    Assessment/Plan:  Paroxysmal atrial fibrillation Patient developed atrial fibrillation with RVR.  She was transferred to the ICU and placed on a Cardizem infusion.  She subsequently converted to sinus rhythm.  She remains in sinus rhythm this morning.  TSH was normal.  Echocardiogram shows normal systolic function.  Significantly elevated pulmonary pressure is noted.   She was also complaining of some pleuritic chest pain yesterday.  D-dimer was elevated.  VQ scan is low probability for PE.  Doppler studies negative for DVT.  No further work-up at this time. Chads 2 vascular score is 3.  Oral anticoagulation is recommended.  Discussed with the patient and her daughter in detail.  They are agreeable.  Discussed also with urology who are also okay with starting anticoagulation.  We will request pharmacy to dose Eliquis.    She  remains in sinus rhythm.  Continue long-acting Cardizem.  3.5 today.  Hemoglobin 9.2.   She will need outpatient referral to cardiology and pulmonology in the next 1 to 2 months.  She lives in the [place] area.  This can be arranged by her PCP.    Acute kidney injury superimposed on chronic kidney disease stage IIIb/hypokalemia Baseline  creatinine is around 1.5-1.7.  Came in with a creatinine of 5.89.  Nephrology was involved.  Patient's creatinine has improved.  Appears to have plateaued.  Lot of urine noted in the pure wick canister.  Bladder scan.  Stop IV hydration since she is now on a diet.  Small bowel obstruction secondary to incisional hernia status post repair on 08/12/2019 Management deferred to the primary team.  Essential hypertension Her antihypertensives including ACE inhibitor and Norvasc were held.  Blood pressure was quite poorly controlled a few days ago.  Now better over the last 48 hours.  Continue long-acting Cardizem for now.    Hypothyroidism TSH was normal.  She is on levothyroxine.  Normocytic anemia Hemoglobin is low but stable.  No evidence of overt bleeding.  Continue to monitor.  History of seizure disorder Continue Keppra and Lamictal.  No seizure activity here in the hospital.  Morbid obesity Estimated body mass index is 39.95 kg/m as calculated from the following:   Height as of this encounter: 5\' 5"  (1.651 m).   Weight as of this encounter: 108.9 kg.   DVT Prophylaxis: On SCDs Code Status: Full code   Medications:  Scheduled: . Chlorhexidine Gluconate Cloth  6 each Topical Daily  . diltiazem  300 mg Oral Daily  . ezetimibe  10 mg Oral Daily  . FLUoxetine  10 mg Oral Daily  . lamoTRIgine  75 mg Oral BID  . levETIRAcetam  1,500 mg Oral BID  . levothyroxine  137 mcg Oral Q0600  . mouth rinse  15 mL Mouth Rinse BID  . metoCLOPramide (REGLAN) injection  5 mg Intravenous Q8H  . pantoprazole  40 mg Oral BID  . rosuvastatin  40 mg Oral Daily  . sodium chloride flush  10-40 mL Intracatheter Q12H   Continuous: . cefTRIAXone (ROCEPHIN)  IV Stopped (08/17/19 2142)  . dextrose 5 % and 0.9% NaCl 50 mL/hr at 08/17/19 1800   IRC:[id number] **OR** diphenhydrAMINE, HYDROmorphone (DILAUDID) injection, labetalol, ondansetron, oxyCODONE, phenol, sodium chloride flush,  zolpidem   Objective:  Vital Signs  Vitals:   08/18/19 0407 08/18/19 0600 08/18/19 0800 08/18/19 0900  BP: (!) 164/89  (!) 163/98 (!) 130/52  Pulse: 72  83 77  Resp: 15  16 16   Temp:  98.2 F (36.8 C) 98.5 F (36.9 C)   TempSrc:  Axillary Oral   SpO2: 100%  98% 95%  Weight:      Height:        Intake/Output Summary (Last 24 hours) at 08/18/2019 0933 Last data filed at 08/18/2019 0904 Gross per 24 hour  Intake 1051.46 ml  Output 1000 ml  Net 51.46 ml   Filed Weights   08/12/19 1041 08/12/19 2224  Weight: 106.1 kg 108.9 kg    General appearance: Awake alert.  In no distress Resp: Clear to auscultation bilaterally.  Normal effort Cardio: S1-S2 is normal regular.  No S3-S4.  No rubs murmurs or bruit GI: Abdomen is mildly distended.  Soft.  Sluggish bowel sounds heard.  No masses organomegaly.   Extremities: No edema.  Full range of motion of lower extremities. Neurologic: Alert and oriented x3.  No focal neurological deficits.      Lab Results:  Data Reviewed: I have personally reviewed following labs and imaging studies  CBC: Recent Labs  Lab 08/12/19 1112 08/12/19 1112 08/13/19 0513 08/13/19 0513 08/14/19 0234 08/15/19 0821 08/16/19 0330 08/17/19 0743 08/18/19 0216  WBC 8.3   < > 4.8  --  5.3  --  7.5 7.1 8.6  NEUTROABS 5.6  --   --   --   --   --  5.3 4.9 5.6  HGB 11.6*   < > 9.3*   < > 8.4* 9.0* 8.2* 8.5* 9.2*  HCT 36.0   < > 29.7*   < > 27.7* 28.8* 26.3* 27.8* 29.5*  MCV 84.5   < > 87.6  --  87.9  --  87.1 88.5 87.3  PLT 393   < > 272  --  268  --  272 293 321   < > = values in this interval not displayed.    Basic Metabolic Panel: Recent Labs  Lab 08/14/19 0234 08/15/19 0030 08/15/19 0821 08/16/19 0330 08/17/19 0743 08/18/19 0216  NA 142 145  --  145 143 143  K 3.5 3.6  --  3.0* 3.0* 3.5  CL 110 117*  --  115* 112* 113*  CO2 18* 17*  --  22 23 20*  GLUCOSE 76 69*  --  116* 122* 112*  BUN 51* 40*  --  29* 17 11  CREATININE 4.67* 3.52*   --  3.01* 2.78* 2.87*  CALCIUM 7.8* 7.9*  --  8.4* 8.1* 8.3*  MG  --   --  1.7  --   --   --     GFR: Estimated Creatinine Clearance: 24 mL/min (A) (by C-G formula based on SCr of 2.87 mg/dL (H)).  Liver Function Tests: Recent Labs  Lab 08/12/19 1112  AST 121*  ALT 194*  ALKPHOS 102  BILITOT 1.1  PROT 8.4*  ALBUMIN 3.3*    Coagulation Profile: Recent Labs  Lab 08/12/19 1132  INR 1.3*    CBG: Recent Labs  Lab 08/17/19 0618 08/17/19 1238 08/17/19 1759 08/17/19 2355 08/18/19 0610  GLUCAP 110* 103* 108* 90 98      Recent Results (from the past 240 hour(s))  Urine Culture     Status: None   Collection Time: 08/12/19 11:18 AM   Specimen: Urine, Clean Catch  Result Value Ref Range Status   Specimen Description   Final    URINE, CLEAN CATCH Performed at [facility], [address] [place], [place] [address]    Special Requests   Final    NONE Performed at [facility], [address] [place], [place] [address]    Culture   Final    NO GROWTH Performed at [facility], [place] [address] [place], [place] [address]    Report Status 08/13/2019 FINAL  Final  Blood Culture (routine x 2)     Status: None   Collection Time: 08/12/19 11:32 AM   Specimen: BLOOD LEFT ARM  Result Value Ref Range Status   Specimen Description BLOOD LEFT ARM  Final   Special Requests   Final    BOTTLES DRAWN AEROBIC AND ANAEROBIC Blood Culture results may not be optimal due to an excessive volume of blood received in culture bottles   Culture   Final    NO GROWTH 5 DAYS Performed at [facility], [address], [place], [place] [address]    Report Status 08/17/2019 FINAL  Final  SARS Coronavirus 2 by RT PCR (hospital order, performed in [facility] hospital lab) Nasopharyngeal Nasopharyngeal Swab     Status: None   Collection Time: 08/12/19  1:41 PM   Specimen: Nasopharyngeal Swab  Result Value Ref Range Status   SARS Coronavirus 2 NEGATIVE NEGATIVE Final    Comment:  (NOTE) SARS-CoV-2 target nucleic acids are NOT DETECTED.  The SARS-CoV-2 RNA is generally detectable in upper and lower respiratory specimens during the acute phase of infection. The lowest concentration of SARS-CoV-2 viral copies this assay can detect is 250 copies / mL. A negative result does not preclude SARS-CoV-2 infection and should not be used as the sole basis for treatment or other patient management decisions.  A negative result may occur with improper specimen collection / handling, submission of specimen other than nasopharyngeal swab, presence of viral mutation(s) within the areas targeted by this assay, and inadequate number of viral copies (<250 copies / mL). A negative result must be combined with clinical observations, patient history, and epidemiological information.  Fact Sheet for Patients:   StrictlyIdeas.no  Fact Sheet for Healthcare Providers: BankingDealers.co.za  This test is not yet approved or  cleared by the [place] FDA and has been authorized for detection and/or diagnosis of SARS-CoV-2 by FDA under an Emergency Use Authorization (EUA).  This EUA will remain in effect (meaning this test can be used) for the duration of the COVID-19 declaration under Section 564(b)(1) of the Act, 21 U.S.C. section 360bbb-3(b)(1), unless the authorization is terminated or revoked sooner.  Performed at [facility], [address], [place], [place] [address]   Surgical PCR screen     Status: None   Collection Time: 08/12/19  7:05 PM   Specimen: Nasal Mucosa; Nasal Swab  Result Value Ref Range Status   MRSA, PCR NEGATIVE NEGATIVE Final   Staphylococcus aureus NEGATIVE NEGATIVE Final    Comment: (NOTE) The Xpert SA Assay (FDA approved for NASAL specimens in patients 61 years of age and older), is one component of a comprehensive surveillance program. It is not intended to diagnose infection nor to guide or monitor  treatment. Performed at [facility], [place] [address] [place], [place] [address]       Radiology Studies: No results found.     LOS: 6 days   [name] [employer] on [email or web address]  08/18/2019, 9:33 AM

## 2019-08-19 DIAGNOSIS — E039 Hypothyroidism, unspecified: Secondary | ICD-10-CM | POA: Diagnosis not present

## 2019-08-19 DIAGNOSIS — I10 Essential (primary) hypertension: Secondary | ICD-10-CM | POA: Diagnosis not present

## 2019-08-19 DIAGNOSIS — I272 Pulmonary hypertension, unspecified: Secondary | ICD-10-CM | POA: Diagnosis not present

## 2019-08-19 DIAGNOSIS — I480 Paroxysmal atrial fibrillation: Secondary | ICD-10-CM | POA: Diagnosis not present

## 2019-08-19 LAB — CBC WITH DIFFERENTIAL/PLATELET
Abs Immature Granulocytes: 0.58 10*3/uL — ABNORMAL HIGH (ref 0.00–0.07)
Basophils Absolute: 0.1 10*3/uL (ref 0.0–0.1)
Basophils Relative: 0 %
Eosinophils Absolute: 0.1 10*3/uL (ref 0.0–0.5)
Eosinophils Relative: 1 %
HCT: 29.3 % — ABNORMAL LOW (ref 36.0–46.0)
Hemoglobin: 9.1 g/dL — ABNORMAL LOW (ref 12.0–15.0)
Immature Granulocytes: 3 %
Lymphocytes Relative: 9 %
Lymphs Abs: 1.6 10*3/uL (ref 0.7–4.0)
MCH: 27.1 pg (ref 26.0–34.0)
MCHC: 31.1 g/dL (ref 30.0–36.0)
MCV: 87.2 fL (ref 80.0–100.0)
Monocytes Absolute: 2.2 10*3/uL — ABNORMAL HIGH (ref 0.1–1.0)
Monocytes Relative: 12 %
Neutro Abs: 13.5 10*3/uL — ABNORMAL HIGH (ref 1.7–7.7)
Neutrophils Relative %: 75 %
Platelets: 287 10*3/uL (ref 150–400)
RBC: 3.36 MIL/uL — ABNORMAL LOW (ref 3.87–5.11)
RDW: 16 % — ABNORMAL HIGH (ref 11.5–15.5)
WBC: 18.2 10*3/uL — ABNORMAL HIGH (ref 4.0–10.5)
nRBC: 0 % (ref 0.0–0.2)

## 2019-08-19 LAB — BASIC METABOLIC PANEL
Anion gap: 11 (ref 5–15)
BUN: 14 mg/dL (ref 8–23)
CO2: 14 mmol/L — ABNORMAL LOW (ref 22–32)
Calcium: 7.9 mg/dL — ABNORMAL LOW (ref 8.9–10.3)
Chloride: 115 mmol/L — ABNORMAL HIGH (ref 98–111)
Creatinine, Ser: 3.73 mg/dL — ABNORMAL HIGH (ref 0.44–1.00)
GFR calc Af Amer: 14 mL/min — ABNORMAL LOW (ref >60)
GFR calc non Af Amer: 12 mL/min — ABNORMAL LOW (ref >60)
Glucose, Bld: 98 mg/dL (ref 70–99)
Potassium: 4 mmol/L (ref 3.5–5.1)
Sodium: 140 mmol/L (ref 135–145)

## 2019-08-19 LAB — URINALYSIS, ROUTINE W REFLEX MICROSCOPIC
Bilirubin Urine: NEGATIVE
Glucose, UA: NEGATIVE mg/dL
Ketones, ur: NEGATIVE mg/dL
Leukocytes,Ua: NEGATIVE
Nitrite: NEGATIVE
Protein, ur: 100 mg/dL — AB
Specific Gravity, Urine: 1.017 (ref 1.005–1.030)
pH: 5 (ref 5.0–8.0)

## 2019-08-19 LAB — GLUCOSE, CAPILLARY
Glucose-Capillary: 107 mg/dL — ABNORMAL HIGH (ref 70–99)
Glucose-Capillary: 117 mg/dL — ABNORMAL HIGH (ref 70–99)

## 2019-08-19 MED ORDER — DILTIAZEM HCL ER COATED BEADS 240 MG PO CP24
240.0000 mg | ORAL_CAPSULE | Freq: Every day | ORAL | Status: DC
  Administered 2019-08-19: 240 mg via ORAL
  Filled 2019-08-19: qty 1

## 2019-08-19 MED ORDER — DILTIAZEM HCL ER COATED BEADS 180 MG PO CP24
180.0000 mg | ORAL_CAPSULE | Freq: Every day | ORAL | Status: DC
  Administered 2019-08-20 – 2019-08-26 (×7): 180 mg via ORAL
  Filled 2019-08-19 (×7): qty 1

## 2019-08-19 MED ORDER — SODIUM BICARBONATE 8.4 % IV SOLN: INTRAVENOUS | Status: DC

## 2019-08-19 MED ORDER — SENNOSIDES-DOCUSATE SODIUM 8.6-50 MG PO TABS: 1.0000 | ORAL_TABLET | Freq: Every evening | ORAL | Status: DC | PRN

## 2019-08-19 MED ORDER — SENNOSIDES-DOCUSATE SODIUM 8.6-50 MG PO TABS
1.0000 | ORAL_TABLET | Freq: Two times a day (BID) | ORAL | Status: DC
  Administered 2019-08-19 – 2019-08-25 (×10): 1 via ORAL
  Filled 2019-08-19 (×13): qty 1

## 2019-08-19 MED ORDER — BISACODYL 10 MG RE SUPP
10.0000 mg | Freq: Once | RECTAL | Status: AC
  Administered 2019-08-19: 10 mg via RECTAL
  Filled 2019-08-19: qty 1

## 2019-08-19 MED ORDER — SODIUM BICARBONATE-DEXTROSE 150-5 MEQ/L-% IV SOLN
150.0000 meq | INTRAVENOUS | Status: DC
  Administered 2019-08-19 – 2019-08-23 (×6): 150 meq via INTRAVENOUS
  Filled 2019-08-19 (×8): qty 1000

## 2019-08-19 MED ORDER — POLYETHYLENE GLYCOL 3350 17 G PO PACK
17.0000 g | PACK | Freq: Every day | ORAL | Status: DC
  Administered 2019-08-19 – 2019-08-25 (×6): 17 g via ORAL
  Filled 2019-08-19 (×7): qty 1

## 2019-08-19 NOTE — Progress Notes (Signed)
Received from ICU via bed to room 1426.  Patient transferred to floor bed and connected to telemetry 1426 NSR. Skin assessment completed by this nurse and Livia Snellen.  A&OX4, skin warm dry to touch with 7 surgical sites to abdomen, some with staples and some with dermabond.  No drainage noted. Abdomen distended and tender.  Purewick in place and connected to suction.  Allevyn to mid back, underneath dressing inspected with no open areas observed.  IV site to right forearm Intact and flushes without difficulty.  SCDs in place and on.  No c/o pain voiced at this time, will continue to monitor.

## 2019-08-19 NOTE — Progress Notes (Signed)
Occupational Therapy Treatment Patient Details Name: [name] MRN: [id number] DOB: 1955/12/01 Today's Date: 08/19/2019    History of present illness [name] is an 64 y.o. female with history of hypertension, seizure disorder, hyperlipidemia, chronic kidney disease stage IIIb, papillary thyroid carcinoma status post thyroidectomy, and and right renal mass status post laparoscopic partial nephrectomy on 08/05/2019 who returned to the emergency department on 08/12/2019 with 2 to 3 days of nausea and vomiting, was found to have an acute kidney injury and partial SBO secondary to incisional hernia, s/p emergent laparosopic hernia reduction / repair 08/12/19, day of admission, for high grade SBO. Onset afib/RVR. POd 2.   OT comments  Treatment focused on improving balance and functional mobility in order to progress to self care tasks. Patient able to perform supine to sit with supervision, standing with min assist and use of RW and transfer to recliner with one assist. Patient exhibits posterior lean and coming heavily back on her heels. With increased time in standing and verbal cues - balance improves - and able to ambulate. Patient reports balance deficits at baseline. Cont POC.   Follow Up Recommendations  SNF;Home health OT;Supervision/Assistance - 24 hour    Equipment Recommendations  None recommended by OT    Recommendations for Other Services      Precautions / Restrictions Precautions Precautions: Fall Precaution Comments: posterior lean       Mobility Bed Mobility Overal bed mobility: Needs Assistance Bed Mobility: Supine to Sit     Supine to sit: Supervision     General bed mobility comments: Increased time and use of bed rails to transfer to side of the bed.  Transfers Overall transfer level: Needs assistance Equipment used: Rolling walker (2 wheeled) Transfers: Sit to/from [facility] Sit to Stand: Min assist Stand pivot transfers: Min  assist       General transfer comment: Patient min assist to stand from bed and initally unstable and coming back on her heels resulting in posterior leaning. Cueing to come onto toes and assistance with stabilizing with walker. Improved with standing. Patient able to take steps forward and backwards with use of RW - though steps backward caused her to posteriorly lean again. Patient seated at side of bed. Then patient ambulated 5 feet to recliner with RW and min assist from therapist. Patient demonstrated controlled return to sitting.    Balance                                           ADL either performed or assessed with clinical judgement   ADL                                               Vision       Perception     Praxis      Cognition Arousal/Alertness: Awake/alert Behavior During Therapy: WFL for tasks assessed/performed Overall Cognitive Status: Within Functional Limits for tasks assessed                                          Exercises     Shoulder Instructions  General Comments      Pertinent Vitals/ Pain       Pain Assessment: Faces Faces Pain Scale: Hurts a little bit Pain Location: left side Pain Descriptors / Indicators: Sore  Home Living                                          Prior Functioning/Environment              Frequency  Min 2X/week        Progress Toward Goals  OT Goals(current goals can now be found in the care plan section)  Progress towards OT goals: Progressing toward goals  Acute Rehab OT Goals Patient Stated Goal: to go home OT Goal Formulation: With patient Time For Goal Achievement: 08/23/19  Plan Discharge plan remains appropriate    Co-evaluation                 AM-PAC OT "6 Clicks" Daily Activity     Outcome Measure   Help from another person eating meals?: None Help from another person taking care of personal  grooming?: A Little Help from another person toileting, which includes using toliet, bedpan, or urinal?: A Lot Help from another person bathing (including washing, rinsing, drying)?: A Lot Help from another person to put on and taking off regular upper body clothing?: A Little Help from another person to put on and taking off regular lower body clothing?: A Lot 6 Click Score: 16    End of Session Equipment Utilized During Treatment: Rolling walker  OT Visit Diagnosis: Unsteadiness on feet (R26.81);Other abnormalities of gait and mobility (R26.89);Muscle weakness (generalized) (M62.81)   Activity Tolerance Patient tolerated treatment well   Patient Left in bed;with bed alarm set;with call bell/phone within reach   Nurse Communication Mobility status        Time: 8333-8329 OT Time Calculation (min): 20 min  Charges: OT General Charges $OT Visit: 1 Visit OT Treatments $Therapeutic Activity: 8-22 mins  Derl Barrow, OTR/L [place]  Office [phone] Pager: [place] 08/19/2019, 3:24 PM

## 2019-08-19 NOTE — Plan of Care (Signed)

## 2019-08-19 NOTE — Progress Notes (Signed)
7 Days Post-Op   Subjective/Chief Complaint:   1 - Small Bowel Obstruction / Incisional Hernia - s/p emergent laparosopic hernia reduction / repair 08/12/19, day of admission, for high grade SBO. NPO with NGT post-op. Regaln started 6/21. Resumed bowel function 6/22 and clears started. Advanced to reg diet 6/23.   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 Ipsilateral cyst decortication benign.   3 - Acute on Chronic Renal Failure - Cr 6's with poor PO intake and ACEI on admit 6/18. CT no hydro / fluid collections. Baseline Cr 1.6's.  Some fluctuations this admission, nephrology kindly following.   4 - Disposition / Rehab - independent at baseline, daughter [name] helps with errands. Deconditioned with several back to back hospital stays. SNF placement pending.   Today "Zerah" is stable. Continuing to tolerate PO w/o emesis. Cr up a bit.   Objective: Vital signs in last 24 hours: Temp:  [98.2 F (36.8 C)-99.4 F (37.4 C)] 99.4 F (37.4 C) (06/25 1232) Pulse Rate:  [79-92] 89 (06/25 1232) Resp:  [13-21] 18 (06/25 1232) BP: (95-144)/(38-63) 136/60 (06/25 1232) SpO2:  [94 %-100 %] 98 % (06/25 1232) Last BM Date: (S)  (No stools have been charted)  Intake/Output from previous day: 06/24 0701 - 06/25 0700 In: 1433.3 [P.O.:480; I.V.:753.3; IV Piggyback:200] Out: 1100 [Urine:1100] Intake/Output this shift: Total I/O In: 227.7 [I.V.:227.7] Out: -   General appearance: alert, cooperative  Eyes: negative Nose: Nares normal. Septum midline. Mucosa normal. No drainage or sinus tenderness. Throat: lips, mucosa, and tongue normal; teeth and gums normal Neck: thyroid not enlarged, symmetric, no tenderness/mass/nodules and old Rt neck line dressing c/d/i, no hematomas.  Back: symmetric, no curvature. ROM normal. No CVA tenderness. Resp: non-labored on [place] O2.  Cardio: sinus with HR mid 80s by  monitor.  GI: stable obesity and decreased distension. Recent port sites c/d/i.  Pelvic: external genitalia normal with purewick in place.  Extremities: extremities normal, atraumatic, no cyanosis or edema Lymph nodes: Cervical, supraclavicular, and axillary nodes normal. Neurologic: Grossly normal  Incision/Wound: as per above.   Lab Results:  Recent Labs    08/18/19 0216 08/19/19 0459  WBC 8.6 18.2*  HGB 9.2* 9.1*  HCT 29.5* 29.3*  PLT 321 287   BMET Recent Labs    08/18/19 0216 08/19/19 0459  NA 143 140  K 3.5 4.0  CL 113* 115*  CO2 20* 14*  GLUCOSE 112* 98  BUN 11 14  CREATININE 2.87* 3.73*  CALCIUM 8.3* 7.9*   PT/INR No results for input(s): LABPROT, INR in the last 72 hours. ABG No results for input(s): PHART, HCO3 in the last 72 hours.  Invalid input(s): PCO2, PO2  Studies/Results: No results found.  Anti-infectives: Anti-infectives (From admission, onward)   Start     Dose/Rate Route Frequency Ordered Stop   08/12/19 2300  cefTRIAXone (ROCEPHIN) 2 g in sodium chloride 0.9 % 100 mL IVPB     Discontinue     2 g 200 mL/hr over 30 Minutes Intravenous Daily at bedtime 08/12/19 2226     08/12/19 1145  cefTRIAXone (ROCEPHIN) 1 g in sodium chloride 0.9 % 100 mL IVPB        1 g 200 mL/hr over 30 Minutes Intravenous  Once 08/12/19 1140 08/12/19 1320      Assessment/Plan:   1 - Small Bowel Obstruction / Incisional Hernia - doing satisfactory. NO objective signs of  recurrent obstruction. Continue reg diet.   2 - Right Renal Cancer  - good prognosis. NO further cancer directed therapy this admission.   3 - Acute on Chronic Renal Failure - likely severe pre-renal. Improved some with hydration. Appreciate nephrol co-managent.   4 - Disposition / Rehab - FL2 for SNF preferably in [place] submitted.   Teressa Lower 08/19/2019

## 2019-08-19 NOTE — Progress Notes (Signed)
Dr. Moshe Cipro gave orders to monitor urine output throughout the night. SRP, RN

## 2019-08-19 NOTE — Progress Notes (Signed)
Pt has decreased urine output , Dr. Moshe Cipro notified. Urine output for the shift 150cc from in and out cath for urine specimen. SRP, RN

## 2019-08-19 NOTE — Progress Notes (Signed)
Subjective:  Called back to see pt for worsening renal function again.  Nadir of 2.7- but then 2.87 yest and 3.7 today.  UOP seems adequate.  On review of chart-  BP does seem to be a little soft-  Also previous U/A appeared to show UTI- recheck U/A pending   Objective Vital signs in last 24 hours: Vitals:   08/18/19 2200 08/18/19 2300 08/19/19 0023 08/19/19 0506  BP: (!) 121/52 (!) 95/46 127/63 (!) 116/38  Pulse: 87 79 92 83  Resp: 13 15 20 18   Temp:   98.9 F (37.2 C) 98.8 F (37.1 C)  TempSrc:   Oral Oral  SpO2: 100% 100% 98% 98%  Weight:      Height:       Weight change:   Intake/Output Summary (Last 24 hours) at 08/19/2019 1202 Last data filed at 08/19/2019 7106 Gross per 24 hour  Intake 360 ml  Output 350 ml  Net 10 ml    Assessment/ Plan: Pt is a 64 y.o. yo female with HTN, obesity, sz d/o  who was admitted on 08/12/2019 with lap robotic assisted partial nephrect c/b re operation for hernia/SBO c/b AKI Assessment/Plan: 1. Renal-  crt in mid to high 1's pre partial nephrectomy. Then with AKI felt to be hemodynamically mediated-   Peaked at close to 6-  Then slow improvement to nadir 2.7-  But worsening the last 48 hours - nonoliguric-  The things that I notice are a soft BP and history of what looks like UTI-- have decreased dose of cardizem since now in NSR- to recheck U/A with culture and follow-  Is not uremic at present- no absolute indications for HD 2. Metabolic acidosis-  Will give some IV bicarb.  Does she need another image of her belly ?   Seems pretty distended and some painful even though she says is passing gas  3. Anemia- fairly stable- 8-9-   No action needed  4. HTN/volume- soft BP- have dec cardizem-  Since bicarb has dropped pretty dramatically will give bicarb drip thru today and check labs in AM- check lactate as well   Louis Meckel    Labs: Basic Metabolic Panel: Recent Labs  Lab 08/17/19 0743 08/18/19 0216 08/19/19 0459  NA 143 143 140   K 3.0* 3.5 4.0  CL 112* 113* 115*  CO2 23 20* 14*  GLUCOSE 122* 112* 98  BUN 17 11 14   CREATININE 2.78* 2.87* 3.73*  CALCIUM 8.1* 8.3* 7.9*   Liver Function Tests: No results for input(s): AST, ALT, ALKPHOS, BILITOT, PROT, ALBUMIN in the last 168 hours. No results for input(s): LIPASE, AMYLASE in the last 168 hours. No results for input(s): AMMONIA in the last 168 hours. CBC: Recent Labs  Lab 08/14/19 0234 08/15/19 0821 08/16/19 0330 08/16/19 0330 08/17/19 0743 08/18/19 0216 08/19/19 0459  WBC 5.3  --  7.5   < > 7.1 8.6 18.2*  NEUTROABS  --   --  5.3   < > 4.9 5.6 13.5*  HGB 8.4*   < > 8.2*   < > 8.5* 9.2* 9.1*  HCT 27.7*   < > 26.3*   < > 27.8* 29.5* 29.3*  MCV 87.9  --  87.1  --  88.5 87.3 87.2  PLT 268  --  272   < > 293 321 287   < > = values in this interval not displayed.   Cardiac Enzymes: No results for input(s): CKTOTAL, CKMB, CKMBINDEX, TROPONINI in the last  168 hours. CBG: Recent Labs  Lab 08/18/19 0610 08/18/19 1238 08/18/19 1743 08/18/19 2354 08/19/19 1149  GLUCAP 98 99 112* 110* 107*    Iron Studies: No results for input(s): IRON, TIBC, TRANSFERRIN, FERRITIN in the last 72 hours. Studies/Results: No results found. Medications: Infusions:  cefTRIAXone (ROCEPHIN)  IV Stopped (08/18/19 2156)    Scheduled Medications:  apixaban  5 mg Oral BID   bisacodyl  10 mg Rectal Once   Chlorhexidine Gluconate Cloth  6 each Topical Daily   [START ON 08/20/2019] diltiazem  180 mg Oral Daily   ezetimibe  10 mg Oral Daily   FLUoxetine  10 mg Oral Daily   lamoTRIgine  75 mg Oral BID   levETIRAcetam  1,500 mg Oral BID   levothyroxine  137 mcg Oral Q0600   mouth rinse  15 mL Mouth Rinse BID   metoCLOPramide (REGLAN) injection  5 mg Intravenous Q8H   pantoprazole  40 mg Oral BID   polyethylene glycol  17 g Oral Daily   rosuvastatin  40 mg Oral Daily   senna-docusate  1 tablet Oral BID   sodium chloride flush  10-40 mL Intracatheter Q12H     have reviewed scheduled and prn medications.  Physical Exam: General: alert, NAD Heart: RRR Lungs: mostly clear Abdomen: distended- some tenderness on right-  Incisions clean Extremities: min edema-  purewick-  Small amount of dark urine    08/19/2019,12:02 PM  LOS: 7 days

## 2019-08-19 NOTE — Progress Notes (Signed)
[facility] PROGRESS NOTE   [name] XBD:[id number] DOB: April 14, 1955 DOA: 08/12/2019  PCP: [name], MD  Brief History/Interval Summary: 64 y.o. female with history of hypertension, seizure disorder, hyperlipidemia, chronic kidney disease stage IIIb, papillary thyroid carcinoma status post thyroidectomy, and and right renal mass status post laparoscopic partial nephrectomy on 08/05/2019 who returned to the emergency department on 08/12/2019 with 2 to 3 days of nausea and vomiting, was found to have an acute kidney injury and partial SBO secondary to incisional hernia.  Patient was admitted to the urology service and taken to the operating room that same day for port site incisional hernia repair.  On the evening of 6/28 patient was found to have heart rate in the 1 30-1 40 range.  EKG showed atrial fibrillation with RVR.  Patient was transferred to the ICU.  Hospitalist was consulted.   Reason for Visit: Atrial fibrillation with RVR  Procedures:  Transthoracic echocardiogram 6/21 1. Hyperdynamic LV systolic function; elevated mean gradient across  aortic valve (19 mmHg) suggests mild AS; however hyperdynamic LV function  at least partially contributing; trace AI; mild TR with severely elevated  pulmonary pressure.  2. Left ventricular ejection fraction, by estimation, is >75%. The left  ventricle has hyperdynamic function. The left ventricle has no regional  wall motion abnormalities. Left ventricular diastolic parameters were  normal.  3. Right ventricular systolic function is normal. The right ventricular  size is normal. There is severely elevated pulmonary artery systolic  pressure.  4. The mitral valve is normal in structure. Trivial mitral valve  regurgitation. No evidence of mitral stenosis.  5. The aortic valve is tricuspid. Aortic valve regurgitation is trivial.  Mild aortic valve stenosis.  6. The inferior vena cava is normal in size with greater  than 50%  respiratory variability, suggesting right atrial pressure of 3 mmHg.   Antibiotics: Anti-infectives (From admission, onward)   Start     Dose/Rate Route Frequency Ordered Stop   08/12/19 2300  cefTRIAXone (ROCEPHIN) 2 g in sodium chloride 0.9 % 100 mL IVPB     Discontinue     2 g 200 mL/hr over 30 Minutes Intravenous Daily at bedtime 08/12/19 2226     08/12/19 1145  cefTRIAXone (ROCEPHIN) 1 g in sodium chloride 0.9 % 100 mL IVPB        1 g 200 mL/hr over 30 Minutes Intravenous  Once 08/12/19 1140 08/12/19 1320      Subjective/Interval History: Patient states that she has been passing gas from below.  Denies any significant abdominal pain today.  No nausea vomiting.      Assessment/Plan:  Paroxysmal atrial fibrillation Patient developed atrial fibrillation with RVR.  She was transferred to the ICU and placed on a Cardizem infusion.  She subsequently converted to sinus rhythm. TSH was normal.  Echocardiogram shows normal systolic function.  Significantly elevated pulmonary pressure is noted.   She was also complaining of some pleuritic chest pain yesterday.  D-dimer was elevated.  VQ scan is low probability for PE.  Doppler studies negative for DVT.  No further work-up at this time. Chads 2 vascular score is 3.  Oral anticoagulation is recommended.  Discussed with the patient and her daughter in detail.  They are agreeable.  Discussed also with urology who are also okay with starting anticoagulation.  Patient was started on Eliquis yesterday. She remains in sinus rhythm.  Continue long-acting Cardizem.  Blood pressure has been somewhat on the lower side over the  last 24 hours.  We will cut back on the dose of Cardizem.  Potassium is normal.  Hemoglobin is stable. She will need outpatient referral to cardiology and pulmonology in the next 1 to 2 months.  She lives in the [place] area.  This can be arranged by her PCP.    Acute kidney injury superimposed on chronic kidney  disease stage IIIb/hypokalemia Baseline creatinine is around 1.5-1.7.  Came in with a creatinine of 5.89.  Nephrology was involved.  Patient's creatinine had improved but over the last 48 hours it has started to climb again.  Now up to 3.73 from 2.87 yesterday.  Reason for this is not entirely clear.  Will request nephrology to reevaluate.  Her IV fluids were discontinued yesterday since she was placed on a diet.  And it does look like that she has been making urine.  Small bowel obstruction secondary to incisional hernia status post repair on 08/12/2019 Management deferred to the primary team.  Essential hypertension Her antihypertensives including ACE inhibitor and Norvasc were held.  Blood pressure was quite poorly controlled a few days ago.  Blood pressure noted to be on the lower side.  We will decrease the dose of Cardizem.    Hypothyroidism TSH was normal.  She is on levothyroxine.  Normocytic anemia Hemoglobin is low but stable.  No evidence of overt bleeding.  Continue to monitor.  History of seizure disorder Continue Keppra and Lamictal.  No seizure activity here in the hospital.  Morbid obesity Estimated body mass index is 39.95 kg/m as calculated from the following:   Height as of this encounter: 5\' 5"  (1.651 m).   Weight as of this encounter: 108.9 kg.   DVT Prophylaxis: On SCDs Code Status: Full code   Medications:  Scheduled: . apixaban  5 mg Oral BID  . Chlorhexidine Gluconate Cloth  6 each Topical Daily  . diltiazem  240 mg Oral Daily  . ezetimibe  10 mg Oral Daily  . FLUoxetine  10 mg Oral Daily  . lamoTRIgine  75 mg Oral BID  . levETIRAcetam  1,500 mg Oral BID  . levothyroxine  137 mcg Oral Q0600  . mouth rinse  15 mL Mouth Rinse BID  . metoCLOPramide (REGLAN) injection  5 mg Intravenous Q8H  . pantoprazole  40 mg Oral BID  . polyethylene glycol  17 g Oral Daily  . rosuvastatin  40 mg Oral Daily  . sodium chloride flush  10-40 mL Intracatheter Q12H    Continuous: . cefTRIAXone (ROCEPHIN)  IV Stopped (08/18/19 2156)   KVQ:QVZDGLOVFIEPPIR **OR** diphenhydrAMINE, HYDROmorphone (DILAUDID) injection, labetalol, ondansetron, oxyCODONE, phenol, senna-docusate, sodium chloride flush, zolpidem   Objective:  Vital Signs  Vitals:   08/18/19 2200 08/18/19 2300 08/19/19 0023 08/19/19 0506  BP: (!) 121/52 (!) 95/46 127/63 (!) 116/38  Pulse: 87 79 92 83  Resp: 13 15 20 18   Temp:   98.9 F (37.2 C) 98.8 F (37.1 C)  TempSrc:   Oral Oral  SpO2: 100% 100% 98% 98%  Weight:      Height:        Intake/Output Summary (Last 24 hours) at 08/19/2019 1032 Last data filed at 08/19/2019 0529 Gross per 24 hour  Intake 600 ml  Output 350 ml  Net 250 ml   Filed Weights   08/12/19 1041 08/12/19 2224  Weight: 106.1 kg 108.9 kg    General appearance: Awake alert.  In no distress Resp: Clear to auscultation bilaterally.  Normal effort Cardio: S1-S2 is  normal regular.  No S3-S4.  No rubs murmurs or bruit GI: Abdomen is soft.  Distended.  Bowel sounds present.  No masses organomegaly.   Extremities: No edema.  Full range of motion of lower extremities. Neurologic: Alert and oriented x3.  No focal neurological deficits.      Lab Results:  Data Reviewed: I have personally reviewed following labs and imaging studies  CBC: Recent Labs  Lab 08/12/19 1112 08/13/19 0513 08/14/19 0234 08/14/19 0234 08/15/19 5809 08/16/19 0330 08/17/19 0743 08/18/19 0216 08/19/19 0459  WBC 8.3   < > 5.3  --   --  7.5 7.1 8.6 18.2*  NEUTROABS 5.6  --   --   --   --  5.3 4.9 5.6 13.5*  HGB 11.6*   < > 8.4*   < > 9.0* 8.2* 8.5* 9.2* 9.1*  HCT 36.0   < > 27.7*   < > 28.8* 26.3* 27.8* 29.5* 29.3*  MCV 84.5   < > 87.9  --   --  87.1 88.5 87.3 87.2  PLT 393   < > 268  --   --  272 293 321 287   < > = values in this interval not displayed.    Basic Metabolic Panel: Recent Labs  Lab 08/15/19 0030 08/15/19 0821 08/16/19 0330 08/17/19 0743 08/18/19 0216  08/19/19 0459  NA 145  --  145 143 143 140  K 3.6  --  3.0* 3.0* 3.5 4.0  CL 117*  --  115* 112* 113* 115*  CO2 17*  --  22 23 20* 14*  GLUCOSE 69*  --  116* 122* 112* 98  BUN 40*  --  29* 17 11 14   CREATININE 3.52*  --  3.01* 2.78* 2.87* 3.73*  CALCIUM 7.9*  --  8.4* 8.1* 8.3* 7.9*  MG  --  1.7  --   --   --   --     GFR: Estimated Creatinine Clearance: 18.5 mL/min (A) (by C-G formula based on SCr of 3.73 mg/dL (H)).  Liver Function Tests: Recent Labs  Lab 08/12/19 1112  AST 121*  ALT 194*  ALKPHOS 102  BILITOT 1.1  PROT 8.4*  ALBUMIN 3.3*    Coagulation Profile: Recent Labs  Lab 08/12/19 1132  INR 1.3*    CBG: Recent Labs  Lab 08/17/19 2355 08/18/19 0610 08/18/19 1238 08/18/19 1743 08/18/19 2354  GLUCAP 90 98 99 112* 110*      Recent Results (from the past 240 hour(s))  Urine Culture     Status: None   Collection Time: 08/12/19 11:18 AM   Specimen: Urine, Clean Catch  Result Value Ref Range Status   Specimen Description   Final    URINE, CLEAN CATCH Performed at [facility], [address] [place], [place] [address]    Special Requests   Final    NONE Performed at [facility], [address] [place], [place] [address]    Culture   Final    NO GROWTH Performed at [facility], [place] [address] [place], [place] [address]    Report Status 08/13/2019 FINAL  Final  Blood Culture (routine x 2)     Status: None   Collection Time: 08/12/19 11:32 AM   Specimen: BLOOD LEFT ARM  Result Value Ref Range Status   Specimen Description BLOOD LEFT ARM  Final   Special Requests   Final    BOTTLES DRAWN AEROBIC AND ANAEROBIC Blood Culture results may not be optimal  due to an excessive volume of blood received in culture bottles   Culture   Final    NO GROWTH 5 DAYS Performed at [facility], [address], [place], [place] [address]    Report Status 08/17/2019 FINAL  Final  SARS Coronavirus 2 by RT PCR (hospital order, performed in [facility]  hospital lab) Nasopharyngeal Nasopharyngeal Swab     Status: None   Collection Time: 08/12/19  1:41 PM   Specimen: Nasopharyngeal Swab  Result Value Ref Range Status   SARS Coronavirus 2 NEGATIVE NEGATIVE Final    Comment: (NOTE) SARS-CoV-2 target nucleic acids are NOT DETECTED.  The SARS-CoV-2 RNA is generally detectable in upper and lower respiratory specimens during the acute phase of infection. The lowest concentration of SARS-CoV-2 viral copies this assay can detect is 250 copies / mL. A negative result does not preclude SARS-CoV-2 infection and should not be used as the sole basis for treatment or other patient management decisions.  A negative result may occur with improper specimen collection / handling, submission of specimen other than nasopharyngeal swab, presence of viral mutation(s) within the areas targeted by this assay, and inadequate number of viral copies (<250 copies / mL). A negative result must be combined with clinical observations, patient history, and epidemiological information.  Fact Sheet for Patients:   StrictlyIdeas.no  Fact Sheet for Healthcare Providers: BankingDealers.co.za  This test is not yet approved or  cleared by the [place] FDA and has been authorized for detection and/or diagnosis of SARS-CoV-2 by FDA under an Emergency Use Authorization (EUA).  This EUA will remain in effect (meaning this test can be used) for the duration of the COVID-19 declaration under Section 564(b)(1) of the Act, 21 U.S.C. section 360bbb-3(b)(1), unless the authorization is terminated or revoked sooner.  Performed at [facility], [address] [place], [place] [address]   Surgical PCR screen     Status: None   Collection Time: 08/12/19  7:05 PM   Specimen: Nasal Mucosa; Nasal Swab  Result Value Ref Range Status   MRSA, PCR NEGATIVE NEGATIVE Final   Staphylococcus aureus NEGATIVE NEGATIVE Final    Comment:  (NOTE) The Xpert SA Assay (FDA approved for NASAL specimens in patients 48 years of age and older), is one component of a comprehensive surveillance program. It is not intended to diagnose infection nor to guide or monitor treatment. Performed at [facility], [place] [address] [place], [place] [address]       Radiology Studies: No results found.     LOS: 7 days   Dastan Krider [employer] on [email or web address]  08/19/2019, 10:32 AM

## 2019-08-19 NOTE — Progress Notes (Addendum)
Obtained UA and culture by I & O cath urine specimen as ordered, 150 cc of dark cloudy amber urine with sediment in catheter tubing. Pt tolerated well, Dulcolax suppository administered, pt abd distended. Will cont to monitor. SRP, RN

## 2019-08-19 NOTE — Plan of Care (Signed)
  Problem: Education: Goal: Knowledge of General Education information will improve Description Including pain rating scale, medication(s)/side effects and non-pharmacologic comfort measures Outcome: Progressing   

## 2019-08-20 DIAGNOSIS — E039 Hypothyroidism, unspecified: Secondary | ICD-10-CM | POA: Diagnosis not present

## 2019-08-20 DIAGNOSIS — I480 Paroxysmal atrial fibrillation: Secondary | ICD-10-CM | POA: Diagnosis not present

## 2019-08-20 DIAGNOSIS — I10 Essential (primary) hypertension: Secondary | ICD-10-CM | POA: Diagnosis not present

## 2019-08-20 LAB — CBC WITH DIFFERENTIAL/PLATELET
Abs Immature Granulocytes: 0.41 10*3/uL — ABNORMAL HIGH (ref 0.00–0.07)
Basophils Absolute: 0 10*3/uL (ref 0.0–0.1)
Basophils Relative: 0 %
Eosinophils Absolute: 0.1 10*3/uL (ref 0.0–0.5)
Eosinophils Relative: 1 %
HCT: 24.6 % — ABNORMAL LOW (ref 36.0–46.0)
Hemoglobin: 7.8 g/dL — ABNORMAL LOW (ref 12.0–15.0)
Immature Granulocytes: 3 %
Lymphocytes Relative: 10 %
Lymphs Abs: 1.4 10*3/uL (ref 0.7–4.0)
MCH: 27.2 pg (ref 26.0–34.0)
MCHC: 31.7 g/dL (ref 30.0–36.0)
MCV: 85.7 fL (ref 80.0–100.0)
Monocytes Absolute: 1.7 10*3/uL — ABNORMAL HIGH (ref 0.1–1.0)
Monocytes Relative: 12 %
Neutro Abs: 10.7 10*3/uL — ABNORMAL HIGH (ref 1.7–7.7)
Neutrophils Relative %: 74 %
Platelets: 302 10*3/uL (ref 150–400)
RBC: 2.87 MIL/uL — ABNORMAL LOW (ref 3.87–5.11)
RDW: 16.1 % — ABNORMAL HIGH (ref 11.5–15.5)
WBC: 14.3 10*3/uL — ABNORMAL HIGH (ref 4.0–10.5)
nRBC: 0 % (ref 0.0–0.2)

## 2019-08-20 LAB — GLUCOSE, CAPILLARY
Glucose-Capillary: 104 mg/dL — ABNORMAL HIGH (ref 70–99)
Glucose-Capillary: 110 mg/dL — ABNORMAL HIGH (ref 70–99)
Glucose-Capillary: 111 mg/dL — ABNORMAL HIGH (ref 70–99)
Glucose-Capillary: 116 mg/dL — ABNORMAL HIGH (ref 70–99)

## 2019-08-20 LAB — BASIC METABOLIC PANEL
Anion gap: 10 (ref 5–15)
BUN: 19 mg/dL (ref 8–23)
CO2: 23 mmol/L (ref 22–32)
Calcium: 7.4 mg/dL — ABNORMAL LOW (ref 8.9–10.3)
Chloride: 103 mmol/L (ref 98–111)
Creatinine, Ser: 4.89 mg/dL — ABNORMAL HIGH (ref 0.44–1.00)
GFR calc Af Amer: 10 mL/min — ABNORMAL LOW (ref >60)
GFR calc non Af Amer: 9 mL/min — ABNORMAL LOW (ref >60)
Glucose, Bld: 141 mg/dL — ABNORMAL HIGH (ref 70–99)
Potassium: 2.9 mmol/L — ABNORMAL LOW (ref 3.5–5.1)
Sodium: 136 mmol/L (ref 135–145)

## 2019-08-20 LAB — URINE CULTURE: Culture: NO GROWTH

## 2019-08-20 LAB — LACTIC ACID, PLASMA: Lactic Acid, Venous: 0.9 mmol/L (ref 0.5–1.9)

## 2019-08-20 MED ORDER — APIXABAN 5 MG PO TABS
5.0000 mg | ORAL_TABLET | Freq: Two times a day (BID) | ORAL | Status: DC
  Administered 2019-08-20: 5 mg via ORAL
  Filled 2019-08-20: qty 1

## 2019-08-20 NOTE — Progress Notes (Addendum)
Urology Inpatient Progress Report  Small bowel obstruction ([place]) [K56.609]  Procedure(s): laparoscopic incisional hernia repair  8 Days Post-Op   Intv/Subj: No acute events overnight. Patient is without complaint. Creatinine continues to increase.  Creatinine is 4.89 from 3.73 yesterday.  She has had low urine output, 150/125.  Afebrile vital signs stable.  Blood pressure looks to be better.  She is on a regular diet and tolerating this.  Continues to have flatus.  Currently on 75 cc/h of sodium bicarbonate.  Hemoglobin is 7.8 from 9.1.  White blood cell count 14.3 from 18.2.   Active Problems:   Seizures ([place])   Incisional hernia with gangrene and obstruction   Atrial fibrillation with RVR ([facility])   Hypothyroidism  Current Facility-Administered Medications  Medication Dose Route Frequency Provider Last Rate Last Admin  . [START ON 08/21/2019] apixaban (ELIQUIS) tablet 5 mg  5 mg Oral BID Bonnielee Haff, MD      . cefTRIAXone (ROCEPHIN) 2 g in sodium chloride 0.9 % 100 mL IVPB  2 g Intravenous QHS Marton Redwood III, MD 200 mL/hr at 08/19/19 2319 2 g at 08/19/19 2319  . Chlorhexidine Gluconate Cloth 2 % PADS 6 each  6 each Topical Daily Marton Redwood III, MD   6 each at 08/18/19 1756  . diltiazem (CARDIZEM CD) 24 hr capsule 180 mg  180 mg Oral Daily Bonnielee Haff, MD      . diphenhydrAMINE (BENADRYL) injection 12.5 mg  12.5 mg Intravenous Q6H PRN Marton Redwood III, MD   12.5 mg at 08/17/19 2106   Or  . diphenhydrAMINE (BENADRYL) 12.5 MG/5ML elixir 12.5 mg  12.5 mg Oral Q6H PRN Marton Redwood III, MD      . ezetimibe (ZETIA) tablet 10 mg  10 mg Oral Daily Marton Redwood III, MD   10 mg at 08/19/19 1007  . FLUoxetine (PROZAC) capsule 10 mg  10 mg Oral Daily Bonnielee Haff, MD   10 mg at 08/19/19 1023  . HYDROmorphone (DILAUDID) injection 0.5-1 mg  0.5-1 mg Intravenous Q2H PRN Marton Redwood III, MD   1 mg at 08/18/19 2111  . labetalol (NORMODYNE) injection 10 mg  10 mg Intravenous  Q2H PRN Bonnielee Haff, MD   10 mg at 08/16/19 0104  . lamoTRIgine (LAMICTAL) tablet 75 mg  75 mg Oral BID Bonnielee Haff, MD   75 mg at 08/19/19 2041  . levETIRAcetam (KEPPRA) tablet 1,500 mg  1,500 mg Oral BID Bonnielee Haff, MD   1,500 mg at 08/19/19 2042  . levothyroxine (SYNTHROID) tablet 137 mcg  137 mcg Oral Q0600 Marton Redwood III, MD   137 mcg at 08/20/19 0514  . MEDLINE mouth rinse  15 mL Mouth Rinse BID Marton Redwood III, MD   15 mL at 08/19/19 2056  . metoCLOPramide (REGLAN) injection 5 mg  5 mg Intravenous Marella Chimes, MD   5 mg at 08/20/19 0514  . ondansetron (ZOFRAN) injection 4 mg  4 mg Intravenous Q4H PRN Marton Redwood III, MD   4 mg at 08/18/19 0756  . oxyCODONE (Oxy IR/ROXICODONE) immediate release tablet 5 mg  5 mg Oral Q4H PRN Marton Redwood III, MD   5 mg at 08/20/19 0524  . pantoprazole (PROTONIX) EC tablet 40 mg  40 mg Oral BID Minda Ditto, RPH   40 mg at 08/19/19 2041  . phenol (CHLORASEPTIC) mouth spray 1 spray  1 spray Mouth/Throat PRN Festus Aloe, MD   1  spray at 08/18/19 1311  . polyethylene glycol (MIRALAX / GLYCOLAX) packet 17 g  17 g Oral Daily Lang Snow, FNP   17 g at 08/19/19 1007  . rosuvastatin (CRESTOR) tablet 40 mg  40 mg Oral Daily Marton Redwood III, MD   40 mg at 08/19/19 1006  . senna-docusate (Senokot-S) tablet 1 tablet  1 tablet Oral BID [name], MD   1 tablet at 08/19/19 2046  . sodium bicarbonate 150 mEq in dextrose 5% 1000 mL infusion  150 mEq Intravenous Continuous [name], MD 75 mL/hr at 08/20/19 0255 150 mEq at 08/20/19 0255  . sodium chloride flush (NS) 0.9 % injection 10-40 mL  10-40 mL Intracatheter Q12H Marton Redwood III, MD   20 mL at 08/19/19 2322  . sodium chloride flush (NS) 0.9 % injection 10-40 mL  10-40 mL Intracatheter PRN Marton Redwood III, MD      . zolpidem [facility]) tablet 5 mg  5 mg Oral QHS PRN Marton Redwood III, MD   5 mg at 08/19/19 2046     Objective: Vital: Vitals:   08/19/19  1232 08/19/19 2033 08/20/19 0017 08/20/19 0440  BP: 136/60 140/63 (!) 122/52 (!) 114/59  Pulse: 89 87 82 83  Resp: 18 18 20 20   Temp: 99.4 F (37.4 C) 98.8 F (37.1 C) 98.8 F (37.1 C) 99 F (37.2 C)  TempSrc: Oral Oral Oral Oral  SpO2: 98% 97% 98% 97%  Weight:      Height:       I/Os: I/O last 3 completed shifts: In: 1373.4 [I.V.:1173.4; IV Piggyback:200] Out: 762 [Urine:475]  Physical Exam:  General: Patient is in no apparent distress Lungs: Normal respiratory effort, chest expands symmetrically. GI: Incisions are c/d/i. The abdomen is mild to moderately distended but soft and appropriately tender without mass.  Ext: lower extremities symmetric  Lab Results: Recent Labs    08/18/19 0216 08/19/19 0459 08/20/19 0454  WBC 8.6 18.2* 14.3*  HGB 9.2* 9.1* 7.8*  HCT 29.5* 29.3* 24.6*   Recent Labs    08/18/19 0216 08/19/19 0459 08/20/19 0454  NA 143 140 136  K 3.5 4.0 2.9*  CL 113* 115* 103  CO2 20* 14* 23  GLUCOSE 112* 98 141*  BUN 11 14 19   CREATININE 2.87* 3.73* 4.89*  CALCIUM 8.3* 7.9* 7.4*   No results for input(s): LABPT, INR in the last 72 hours. No results for input(s): LABURIN in the last 72 hours. Results for orders placed or performed during the hospital encounter of 08/12/19  Urine Culture     Status: None   Collection Time: 08/12/19 11:18 AM   Specimen: Urine, Clean Catch  Result Value Ref Range Status   Specimen Description   Final    URINE, CLEAN CATCH Performed at [facility], [address] [place], [place] [address]    Special Requests   Final    NONE Performed at [facility], [address], [place], [place] [address]    Culture   Final    NO GROWTH Performed at [facility], [place] [address] [place], [place] [address]    Report Status 08/13/2019 FINAL  Final  Blood Culture (routine x 2)     Status: None   Collection Time: 08/12/19 11:32 AM   Specimen: BLOOD LEFT ARM  Result Value Ref Range Status   Specimen Description  BLOOD LEFT ARM  Final   Special Requests   Final    BOTTLES DRAWN AEROBIC  AND ANAEROBIC Blood Culture results may not be optimal due to an excessive volume of blood received in culture bottles   Culture   Final    NO GROWTH 5 DAYS Performed at [facility], [address], [place], [place] [address]    Report Status 08/17/2019 FINAL  Final  SARS Coronavirus 2 by RT PCR (hospital order, performed in [facility] hospital lab) Nasopharyngeal Nasopharyngeal Swab     Status: None   Collection Time: 08/12/19  1:41 PM   Specimen: Nasopharyngeal Swab  Result Value Ref Range Status   SARS Coronavirus 2 NEGATIVE NEGATIVE Final    Comment: (NOTE) SARS-CoV-2 target nucleic acids are NOT DETECTED.  The SARS-CoV-2 RNA is generally detectable in upper and lower respiratory specimens during the acute phase of infection. The lowest concentration of SARS-CoV-2 viral copies this assay can detect is 250 copies / mL. A negative result does not preclude SARS-CoV-2 infection and should not be used as the sole basis for treatment or other patient management decisions.  A negative result may occur with improper specimen collection / handling, submission of specimen other than nasopharyngeal swab, presence of viral mutation(s) within the areas targeted by this assay, and inadequate number of viral copies (<250 copies / mL). A negative result must be combined with clinical observations, patient history, and epidemiological information.  Fact Sheet for Patients:   StrictlyIdeas.no  Fact Sheet for Healthcare Providers: BankingDealers.co.za  This test is not yet approved or  cleared by the [place] FDA and has been authorized for detection and/or diagnosis of SARS-CoV-2 by FDA under an Emergency Use Authorization (EUA).  This EUA will remain in effect (meaning this test can be used) for the duration of the COVID-19 declaration under Section 564(b)(1) of the  Act, 21 U.S.C. section 360bbb-3(b)(1), unless the authorization is terminated or revoked sooner.  Performed at [facility], [address] [place], [place] [address]   Surgical PCR screen     Status: None   Collection Time: 08/12/19  7:05 PM   Specimen: Nasal Mucosa; Nasal Swab  Result Value Ref Range Status   MRSA, PCR NEGATIVE NEGATIVE Final   Staphylococcus aureus NEGATIVE NEGATIVE Final    Comment: (NOTE) The Xpert SA Assay (FDA approved for NASAL specimens in patients 69 years of age and older), is one component of a comprehensive surveillance program. It is not intended to diagnose infection nor to guide or monitor treatment. Performed at [facility], [place] [address], [place], [place] [address]   Culture, Urine     Status: None   Collection Time: 08/19/19  9:40 AM   Specimen: Urine, Catheterized  Result Value Ref Range Status   Specimen Description   Final    URINE, CATHETERIZED Performed at [place] [address] [place], [place] [address]    Special Requests   Final    NONE Performed at [facility], [place] [address] [place], [place] [address]    Culture   Final    NO GROWTH Performed at [facility], [place] [address], [place],  [address]    Report Status 08/20/2019 FINAL  Final    Studies/Results: No results found.  Assessment: Port site hernia Acute renal insufficiency on top of CKD stage III, worsening Anemia Atrial fibrillation  Procedure(s): laparoscopic incisional hernia repair, 8 Days Post-Op  doing well.  Plan: A.m. labs Continue general diet Appreciate nephrology and hospitalist recommendations and management.  Nephrology following and managing fluids.  Hospitalist managing hypertension and atrial fibrillation.  Currently holding anticoagulation in the setting of hemoglobin of 7.8.  I will continue to follow hemoglobin.  Hospitalist noted that abdomen may be more distended  today.  It is still soft and benign on my exam.  I will hold off on repeat CT unless exam worsens, worsening leukocytosis.  Urine culture no growth.  May consider discontinuing ceftriaxone tomorrow.  I will look at her leukocytosis tomorrow.   Link Snuffer, MD Urology 08/20/2019, 11:16 AM

## 2019-08-20 NOTE — Progress Notes (Signed)
[facility] PROGRESS NOTE   [name] XNA:[id number] DOB: 1955-08-06 DOA: 08/12/2019  PCP: [name], MD  Brief History/Interval Summary: 64 y.o. female with history of hypertension, seizure disorder, hyperlipidemia, chronic kidney disease stage IIIb, papillary thyroid carcinoma status post thyroidectomy, and and right renal mass status post laparoscopic partial nephrectomy on 08/05/2019 who returned to the emergency department on 08/12/2019 with 2 to 3 days of nausea and vomiting, was found to have an acute kidney injury and partial SBO secondary to incisional hernia.  Patient was admitted to the urology service and taken to the operating room that same day for port site incisional hernia repair.  On the evening of 6/28 patient was found to have heart rate in the 1 30-1 40 range.  EKG showed atrial fibrillation with RVR.  Patient was transferred to the ICU.  Hospitalist was consulted.   Reason for Visit: Atrial fibrillation with RVR  Procedures:  Transthoracic echocardiogram 6/21 1. Hyperdynamic LV systolic function; elevated mean gradient across  aortic valve (19 mmHg) suggests mild AS; however hyperdynamic LV function  at least partially contributing; trace AI; mild TR with severely elevated  pulmonary pressure.  2. Left ventricular ejection fraction, by estimation, is >75%. The left  ventricle has hyperdynamic function. The left ventricle has no regional  wall motion abnormalities. Left ventricular diastolic parameters were  normal.  3. Right ventricular systolic function is normal. The right ventricular  size is normal. There is severely elevated pulmonary artery systolic  pressure.  4. The mitral valve is normal in structure. Trivial mitral valve  regurgitation. No evidence of mitral stenosis.  5. The aortic valve is tricuspid. Aortic valve regurgitation is trivial.  Mild aortic valve stenosis.  6. The inferior vena cava is normal in size with greater  than 50%  respiratory variability, suggesting right atrial pressure of 3 mmHg.   Antibiotics: Anti-infectives (From admission, onward)   Start     Dose/Rate Route Frequency Ordered Stop   08/12/19 2300  cefTRIAXone (ROCEPHIN) 2 g in sodium chloride 0.9 % 100 mL IVPB     Discontinue     2 g 200 mL/hr over 30 Minutes Intravenous Daily at bedtime 08/12/19 2226     08/12/19 1145  cefTRIAXone (ROCEPHIN) 1 g in sodium chloride 0.9 % 100 mL IVPB        1 g 200 mL/hr over 30 Minutes Intravenous  Once 08/12/19 1140 08/12/19 1320      Subjective/Interval History: Patient states that she has abdominal pain.  Has passed some gas yesterday and today but has not had any bowel movements.  Abdomen feels distended.  Also has not been making much urine.  Denies any shortness of breath.    Assessment/Plan:  Paroxysmal atrial fibrillation Patient developed atrial fibrillation with RVR.  She was transferred to the ICU and placed on a Cardizem infusion.  She subsequently converted to sinus rhythm. TSH was normal.  Echocardiogram shows normal systolic function.  Significantly elevated pulmonary pressure is noted.   She also had some pleuritic chest pain initially.  D-dimer was elevated.  VQ scan is low probability for PE.  Doppler studies negative for DVT.  No further work-up at this time. Chads 2 vascular score is 3.  Oral anticoagulation is recommended.  Discussed with the patient and her daughter in detail.  They were agreeable.  Patient was subsequently started on Eliquis after discussing with urology.  She remains in sinus rhythm.  Dose of Cardizem was decreased due to  borderline low blood pressures.  Continue to monitor electrolytes. Due to drop in hemoglobin we will hold her Eliquis today.  She likely needs abdominal imaging.  Discussed with Dr. Gloriann Loan with urology.   She will need outpatient referral to cardiology and pulmonology in the next 1 to 2 months.  She lives in the [place] area.  This can be  arranged by her PCP.    Acute kidney injury superimposed on chronic kidney disease stage IIIb/hypokalemia Baseline creatinine is around 1.5-1.7.  Came in with a creatinine of 5.89.  Nephrology was involved.  Patient's creatinine had improved but starting 6/24.   Nephrology was reconsulted.  She was started on a bicarbonate infusion.  Creatinine level has gone up to 4.89 today.  Wonder if increase intra-abdominal pressure is contributing.   Defer potassium to nephrology.  Small bowel obstruction secondary to incisional hernia status post repair on 08/12/2019 Management deferred to the primary team.  Discussed with Dr. Gloriann Loan who is covering urology service today.  He will evaluate patient and consider abdominal imaging studies as the patient continues to have distention and tenderness.  WBC also noted to be high although better today compared to yesterday.  Essential hypertension Her antihypertensives including ACE inhibitor and Norvasc were held.  Blood pressure was quite poorly controlled a few days ago.  She was on Cardizem for her atrial fibrillation.  Blood pressure noted to be somewhat lower over the last 48 hours.  The dose of Cardizem was decreased yesterday.   Hypothyroidism TSH was normal.  She is on levothyroxine.  Normocytic anemia Drop in hemoglobin noted this morning from 9.1-7.8.  Hold Eliquis for now.  No overt bleeding noted.    History of seizure disorder Continue Keppra and Lamictal.  No seizure activity here in the hospital.  May need to switch the Keppra back to intravenous depending on her SBO status.  Morbid obesity Estimated body mass index is 39.95 kg/m as calculated from the following:   Height as of this encounter: 5\' 5"  (1.651 m).   Weight as of this encounter: 108.9 kg.   DVT Prophylaxis: SCDs plus Eliquis.  Eliquis on hold today. Code Status: Full code   Medications:  Scheduled: . [START ON 08/21/2019] apixaban  5 mg Oral BID  . Chlorhexidine Gluconate  Cloth  6 each Topical Daily  . diltiazem  180 mg Oral Daily  . ezetimibe  10 mg Oral Daily  . FLUoxetine  10 mg Oral Daily  . lamoTRIgine  75 mg Oral BID  . levETIRAcetam  1,500 mg Oral BID  . levothyroxine  137 mcg Oral Q0600  . mouth rinse  15 mL Mouth Rinse BID  . metoCLOPramide (REGLAN) injection  5 mg Intravenous Q8H  . pantoprazole  40 mg Oral BID  . polyethylene glycol  17 g Oral Daily  . rosuvastatin  40 mg Oral Daily  . senna-docusate  1 tablet Oral BID  . sodium chloride flush  10-40 mL Intracatheter Q12H   Continuous: . cefTRIAXone (ROCEPHIN)  IV 2 g (08/19/19 2319)  . sodium bicarbonate 150 mEq in dextrose 5% 1000 mL 150 mEq (08/20/19 0255)   VHQ:[id number] **OR** diphenhydrAMINE, HYDROmorphone (DILAUDID) injection, labetalol, ondansetron, oxyCODONE, phenol, sodium chloride flush, zolpidem   Objective:  Vital Signs  Vitals:   08/19/19 1232 08/19/19 2033 08/20/19 0017 08/20/19 0440  BP: 136/60 140/63 (!) 122/52 (!) 114/59  Pulse: 89 87 82 83  Resp: 18 18 20 20   Temp: 99.4 F (37.4 C) 98.8 F (37.1  C) 98.8 F (37.1 C) 99 F (37.2 C)  TempSrc: Oral Oral Oral Oral  SpO2: 98% 97% 98% 97%  Weight:      Height:        Intake/Output Summary (Last 24 hours) at 08/20/2019 1115 Last data filed at 08/20/2019 0547 Gross per 24 hour  Intake 1253.35 ml  Output 275 ml  Net 978.35 ml   Filed Weights   08/12/19 1041 08/12/19 2224  Weight: 106.1 kg 108.9 kg   General appearance: Awake alert.  In no distress Resp: Diminished air entry at the bases.  No wheezing or rhonchi.  Normal effort noted.   Cardio: S1-S2 is normal regular.  No S3-S4.  No rubs murmurs or bruit GI: Abdomen is distended.  Bowel sounds present but sluggish.  No masses organomegaly.  Tender diffusely without any rebound rigidity or guarding. Extremities: Moving all her extremities. Neurologic: Alert and oriented x3.  No focal neurological deficits.     Lab Results:  Data Reviewed: I have  personally reviewed following labs and imaging studies  CBC: Recent Labs  Lab 08/16/19 0330 08/17/19 0743 08/18/19 0216 08/19/19 0459 08/20/19 0454  WBC 7.5 7.1 8.6 18.2* 14.3*  NEUTROABS 5.3 4.9 5.6 13.5* 10.7*  HGB 8.2* 8.5* 9.2* 9.1* 7.8*  HCT 26.3* 27.8* 29.5* 29.3* 24.6*  MCV 87.1 88.5 87.3 87.2 85.7  PLT 272 293 321 287 259    Basic Metabolic Panel: Recent Labs  Lab 08/15/19 0030 08/15/19 0821 08/16/19 0330 08/17/19 0743 08/18/19 0216 08/19/19 0459 08/20/19 0454  NA   < >  --  145 143 143 140 136  K   < >  --  3.0* 3.0* 3.5 4.0 2.9*  CL   < >  --  115* 112* 113* 115* 103  CO2   < >  --  22 23 20* 14* 23  GLUCOSE   < >  --  116* 122* 112* 98 141*  BUN   < >  --  29* 17 11 14 19   CREATININE   < >  --  3.01* 2.78* 2.87* 3.73* 4.89*  CALCIUM   < >  --  8.4* 8.1* 8.3* 7.9* 7.4*  MG  --  1.7  --   --   --   --   --    < > = values in this interval not displayed.    GFR: Estimated Creatinine Clearance: 14.1 mL/min (A) (by C-G formula based on SCr of 4.89 mg/dL (H)).   CBG: Recent Labs  Lab 08/18/19 2354 08/19/19 1149 08/19/19 1708 08/20/19 0013 08/20/19 0514  GLUCAP 110* 107* 117* 104* 111*      Recent Results (from the past 240 hour(s))  Urine Culture     Status: None   Collection Time: 08/12/19 11:18 AM   Specimen: Urine, Clean Catch  Result Value Ref Range Status   Specimen Description   Final    URINE, CLEAN CATCH Performed at [facility], [address] [place], [place] [address]    Special Requests   Final    NONE Performed at [facility], [address] [place], [place] [address]    Culture   Final    NO GROWTH Performed at [facility], [place] [address] [place], [place] [address]    Report Status 08/13/2019 FINAL  Final  Blood Culture (routine x 2)     Status: None   Collection Time: 08/12/19 11:32 AM   Specimen: BLOOD LEFT ARM  Result Value  Ref Range Status   Specimen Description BLOOD LEFT ARM  Final   Special Requests    Final    BOTTLES DRAWN AEROBIC AND ANAEROBIC Blood Culture results may not be optimal due to an excessive volume of blood received in culture bottles   Culture   Final    NO GROWTH 5 DAYS Performed at [facility], [address], [place], [place] [address]    Report Status 08/17/2019 FINAL  Final  SARS Coronavirus 2 by RT PCR (hospital order, performed in [facility] hospital lab) Nasopharyngeal Nasopharyngeal Swab     Status: None   Collection Time: 08/12/19  1:41 PM   Specimen: Nasopharyngeal Swab  Result Value Ref Range Status   SARS Coronavirus 2 NEGATIVE NEGATIVE Final    Comment: (NOTE) SARS-CoV-2 target nucleic acids are NOT DETECTED.  The SARS-CoV-2 RNA is generally detectable in upper and lower respiratory specimens during the acute phase of infection. The lowest concentration of SARS-CoV-2 viral copies this assay can detect is 250 copies / mL. A negative result does not preclude SARS-CoV-2 infection and should not be used as the sole basis for treatment or other patient management decisions.  A negative result may occur with improper specimen collection / handling, submission of specimen other than nasopharyngeal swab, presence of viral mutation(s) within the areas targeted by this assay, and inadequate number of viral copies (<250 copies / mL). A negative result must be combined with clinical observations, patient history, and epidemiological information.  Fact Sheet for Patients:   StrictlyIdeas.no  Fact Sheet for Healthcare Providers: BankingDealers.co.za  This test is not yet approved or  cleared by the [place] FDA and has been authorized for detection and/or diagnosis of SARS-CoV-2 by FDA under an Emergency Use Authorization (EUA).  This EUA will remain in effect (meaning this test can be used) for the duration of the COVID-19 declaration under Section 564(b)(1) of the Act, 21 U.S.C. section 360bbb-3(b)(1),  unless the authorization is terminated or revoked sooner.  Performed at [facility], [address] [place], [place] [address]   Surgical PCR screen     Status: None   Collection Time: 08/12/19  7:05 PM   Specimen: Nasal Mucosa; Nasal Swab  Result Value Ref Range Status   MRSA, PCR NEGATIVE NEGATIVE Final   Staphylococcus aureus NEGATIVE NEGATIVE Final    Comment: (NOTE) The Xpert SA Assay (FDA approved for NASAL specimens in patients 32 years of age and older), is one component of a comprehensive surveillance program. It is not intended to diagnose infection nor to guide or monitor treatment. Performed at [facility], [place] [address] [place], [place] [address]   Culture, Urine     Status: None   Collection Time: 08/19/19  9:40 AM   Specimen: Urine, Catheterized  Result Value Ref Range Status   Specimen Description   Final    URINE, CATHETERIZED Performed at [place] [address], [place], [place] [address]    Special Requests   Final    NONE Performed at [facility], [place] [address], [place], [place] [address]    Culture   Final    NO GROWTH Performed at [facility], [place] [address], [place], [place] [address]    Report Status 08/20/2019 FINAL  Final      Radiology Studies: No results found.     LOS: 8 days   Oanh Devivo [employer] on [email or web address]  08/20/2019, 11:15 AM

## 2019-08-20 NOTE — Progress Notes (Deleted)
   08/20/19 1443  What Happened  Was fall witnessed? Yes  Who witnessed fall?  (Chance, Alaja Goldinger RN and [place], [place] )  Patients activity before fall to/from bed, chair, or stretcher  Point of contact buttocks  Was patient injured? No

## 2019-08-20 NOTE — TOC Progression Note (Signed)
Transition of Care [facility]) - Progression Note    Patient Details  Name: [name] MRN: [id number] Date of Birth: April 04, 1955  Transition of Care [facility]) CM/SW Contact  Lennart Pall, LCSW Phone Number: 08/20/2019, 1:13 PM  Clinical Narrative:   Met with pt today to discuss/ confirm d/c plan when pt medically cleared.  Pt confirms that she does have a [facility] home health aide 5p-8p daily, however, she is alone all other times.  Only local family, a niece, has health problems herself and cannot assist.  Pt aware that therapies are recommending SNF for rehab and she is agreeable with this plan.  A FL2 was sent out Thursday afternoon with just a couple of offers.  Pt would prefer a facility in [place] so will resend to those facilities to see if they can offer.  Await medical clearance for SNF dc.     Expected Discharge Plan: Home/Self Care Barriers to Discharge: Continued Medical Work up  Expected Discharge Plan and Services Expected Discharge Plan: Home/Self Care       Living arrangements for the past 2 months: Apartment                                       Social Determinants of Health (SDOH) Interventions    Readmission Risk Interventions No flowsheet data found.

## 2019-08-20 NOTE — Progress Notes (Signed)
   08/20/19 1443  What Happened  Was fall witnessed? Yes  Who witnessed fall?  (Chance, Sherrill Buikema RN and [place], [place] )  Patients activity before fall to/from bed, chair, or stretcher  Point of contact buttocks  Was patient injured? No  Follow Up  MD notified  (Dr. Gloriann Loan )  Time MD notified [phone]  Family notified No - patient refusal  Additional tests No  Progress note created (see row info) Yes  Adult Fall Risk Assessment  Risk Factor Category (scoring not indicated) Not Applicable  Age 64  Fall History: Fall within 6 months prior to admission 0  Elimination; Bowel and/or Urine Incontinence 2  Elimination; Bowel and/or Urine Urgency/Frequency 0  Medications: includes PCA/Opiates, Anti-convulsants, Anti-hypertensives, Diuretics, Hypnotics, Laxatives, Sedatives, and Psychotropics 3  Patient Care Equipment 2  Mobility-Assistance 2  Mobility-Gait 2  Mobility-Sensory Deficit 0  Altered awareness of immediate physical environment 0  Impulsiveness 0  Lack of understanding of one's physical/cognitive limitations 0  Total Score 12  Patient Fall Risk Level Moderate fall risk  Adult Fall Risk Interventions  Required Bundle Interventions *See Row Information* Moderate fall risk - low and moderate requirements implemented  Additional Interventions Use of appropriate toileting equipment (bedpan, BSC, etc.)  Screening for Fall Injury Risk (To be completed on HIGH fall risk patients) - Assessing Need for Low Bed  Risk For Fall Injury- Low Bed Criteria None identified - Continue screening  Will Implement Low Bed and Floor Mats No - Criteria no longer met for low bed  Screening for Fall Injury Risk (To be completed on HIGH fall risk patients who do not meet crieteria for Low Bed) - Assessing Need for Floor Mats Only  Risk For Fall Injury- Criteria for Floor Mats None identified - No additional interventions needed  Vitals  Temp 98.1 F (36.7 C)  Temp Source Oral  BP 123/66  BP Location Left Arm   BP Method Automatic  Patient Position (if appropriate) Lying  Pulse Rate 88  Pulse Rate Source Dinamap  Resp 20  Oxygen Therapy  SpO2 96 %  O2 Device Room Air  Pain Assessment  Pain Scale 0-10  Pain Score 9  Pain Type Chronic pain  Pain Location Hip  Pain Orientation Right  Pain Descriptors / Indicators Aching  Pain Frequency Intermittent  Pain Onset Progressive  Patients Stated Pain Goal 2  Neurological  Neuro (WDL) WDL  Level of Consciousness Alert  Orientation Level Oriented X4  Cognition Appropriate at baseline  Speech Clear  Pupil Assessment  No  Musculoskeletal  Musculoskeletal (WDL) X  Assistive Device Front wheel walker  Generalized Weakness Yes  Weight Bearing Restrictions No

## 2019-08-20 NOTE — Progress Notes (Signed)
Subjective:  Called back 6/25 to see pt for worsening renal function again.   crt worsened again overnight- UOP now seems down.  BP is higher though- better- urine only 6-10 WBC- culture negative so far.  Sitting up at bedside chair eating bacon- no c/o's    Objective Vital signs in last 24 hours: Vitals:   08/19/19 1232 08/19/19 2033 08/20/19 0017 08/20/19 0440  BP: 136/60 140/63 (!) 122/52 (!) 114/59  Pulse: 89 87 82 83  Resp: 18 18 20 20   Temp: 99.4 F (37.4 C) 98.8 F (37.1 C) 98.8 F (37.1 C) 99 F (37.2 C)  TempSrc: Oral Oral Oral Oral  SpO2: 98% 97% 98% 97%  Weight:      Height:       Weight change:   Intake/Output Summary (Last 24 hours) at 08/20/2019 1218 Last data filed at 08/20/2019 0547 Gross per 24 hour  Intake 1253.35 ml  Output 275 ml  Net 978.35 ml    Assessment/ Plan: Pt is a 64 y.o. yo female with HTN, obesity, sz d/o  who was admitted on 08/12/2019 with lap robotic assisted partial nephrect c/b re operation for hernia/SBO c/b AKI Assessment/Plan: 1. Renal-  crt in mid to high 1's pre partial nephrectomy. Then with AKI felt to be hemodynamically mediated-   Peaked at close to 6-  Then slow improvement to nadir 2.7-  But worsening the last 72 hours - nonoliguric initially but now UOP down-  The things that I noticed were a soft BP and history of  UTI-- have decreased dose of cardizem since now in NSR- BP is better-  recheck U/A not that remarkable- no absolute indications for HD but dont love the trend- hope now that BP is better renal function will plateau again  2. Metabolic acidosis-  Will give some IV bicarb.  Lactate OK-  Surgery not concerned with exam   Seems pretty distended and some painful even though she says is passing gas  3. Anemia- fairly stable- 8-9-   Down to in the 7's today -  Give ESA dose  4. HTN/volume- soft BP- have dec cardizem-  Is better  [name]    Labs: Basic Metabolic Panel: Recent Labs  Lab 08/18/19 0216  08/19/19 0459 08/20/19 0454  NA 143 140 136  K 3.5 4.0 2.9*  CL 113* 115* 103  CO2 20* 14* 23  GLUCOSE 112* 98 141*  BUN 11 14 19   CREATININE 2.87* 3.73* 4.89*  CALCIUM 8.3* 7.9* 7.4*   Liver Function Tests: No results for input(s): AST, ALT, ALKPHOS, BILITOT, PROT, ALBUMIN in the last 168 hours. No results for input(s): LIPASE, AMYLASE in the last 168 hours. No results for input(s): AMMONIA in the last 168 hours. CBC: Recent Labs  Lab 08/16/19 0330 08/16/19 0330 08/17/19 0743 08/17/19 0743 08/18/19 0216 08/19/19 0459 08/20/19 0454  WBC 7.5   < > 7.1   < > 8.6 18.2* 14.3*  NEUTROABS 5.3   < > 4.9   < > 5.6 13.5* 10.7*  HGB 8.2*   < > 8.5*   < > 9.2* 9.1* 7.8*  HCT 26.3*   < > 27.8*   < > 29.5* 29.3* 24.6*  MCV 87.1  --  88.5  --  87.3 87.2 85.7  PLT 272   < > 293   < > 321 287 302   < > = values in this interval not displayed.   Cardiac Enzymes: No results for input(s): CKTOTAL, CKMB,  CKMBINDEX, TROPONINI in the last 168 hours. CBG: Recent Labs  Lab 08/19/19 1149 08/19/19 1708 08/20/19 0013 08/20/19 0514 08/20/19 1211  GLUCAP 107* 117* 104* 111* 110*    Iron Studies: No results for input(s): IRON, TIBC, TRANSFERRIN, FERRITIN in the last 72 hours. Studies/Results: No results found. Medications: Infusions:  cefTRIAXone (ROCEPHIN)  IV 2 g (08/19/19 2319)   sodium bicarbonate 150 mEq in dextrose 5% 1000 mL 150 mEq (08/20/19 0255)    Scheduled Medications:  [START ON 08/21/2019] apixaban  5 mg Oral BID   Chlorhexidine Gluconate Cloth  6 each Topical Daily   diltiazem  180 mg Oral Daily   ezetimibe  10 mg Oral Daily   FLUoxetine  10 mg Oral Daily   lamoTRIgine  75 mg Oral BID   levETIRAcetam  1,500 mg Oral BID   levothyroxine  137 mcg Oral Q0600   mouth rinse  15 mL Mouth Rinse BID   metoCLOPramide (REGLAN) injection  5 mg Intravenous Q8H   pantoprazole  40 mg Oral BID   polyethylene glycol  17 g Oral Daily   rosuvastatin  40 mg Oral Daily    senna-docusate  1 tablet Oral BID   sodium chloride flush  10-40 mL Intracatheter Q12H    have reviewed scheduled and prn medications.  Physical Exam: General: alert, NAD Heart: RRR Lungs: mostly clear Abdomen: distended- some tenderness on right-  Incisions clean Extremities: min edema-  purewick-  Small amount of dark urine    08/20/2019,12:18 PM  LOS: 8 days

## 2019-08-21 ENCOUNTER — Inpatient Hospital Stay (HOSPITAL_COMMUNITY): Payer: Medicare Other

## 2019-08-21 DIAGNOSIS — I480 Paroxysmal atrial fibrillation: Secondary | ICD-10-CM | POA: Diagnosis not present

## 2019-08-21 DIAGNOSIS — E039 Hypothyroidism, unspecified: Secondary | ICD-10-CM | POA: Diagnosis not present

## 2019-08-21 DIAGNOSIS — I10 Essential (primary) hypertension: Secondary | ICD-10-CM | POA: Diagnosis not present

## 2019-08-21 LAB — CBC
HCT: 26.4 % — ABNORMAL LOW (ref 36.0–46.0)
Hemoglobin: 8.7 g/dL — ABNORMAL LOW (ref 12.0–15.0)
MCH: 27.7 pg (ref 26.0–34.0)
MCHC: 33 g/dL (ref 30.0–36.0)
MCV: 84.1 fL (ref 80.0–100.0)
Platelets: 270 10*3/uL (ref 150–400)
RBC: 3.14 MIL/uL — ABNORMAL LOW (ref 3.87–5.11)
RDW: 16.1 % — ABNORMAL HIGH (ref 11.5–15.5)
WBC: 13.4 10*3/uL — ABNORMAL HIGH (ref 4.0–10.5)
nRBC: 0 % (ref 0.0–0.2)

## 2019-08-21 LAB — RENAL FUNCTION PANEL
Albumin: 2 g/dL — ABNORMAL LOW (ref 3.5–5.0)
Anion gap: 11 (ref 5–15)
BUN: 20 mg/dL (ref 8–23)
CO2: 27 mmol/L (ref 22–32)
Calcium: 7.4 mg/dL — ABNORMAL LOW (ref 8.9–10.3)
Chloride: 105 mmol/L (ref 98–111)
Creatinine, Ser: 5.83 mg/dL — ABNORMAL HIGH (ref 0.44–1.00)
GFR calc Af Amer: 8 mL/min — ABNORMAL LOW (ref >60)
GFR calc non Af Amer: 7 mL/min — ABNORMAL LOW (ref >60)
Glucose, Bld: 109 mg/dL — ABNORMAL HIGH (ref 70–99)
Phosphorus: 2.7 mg/dL (ref 2.5–4.6)
Potassium: 2.9 mmol/L — ABNORMAL LOW (ref 3.5–5.1)
Sodium: 143 mmol/L (ref 135–145)

## 2019-08-21 LAB — GLUCOSE, CAPILLARY
Glucose-Capillary: 108 mg/dL — ABNORMAL HIGH (ref 70–99)
Glucose-Capillary: 117 mg/dL — ABNORMAL HIGH (ref 70–99)
Glucose-Capillary: 99 mg/dL (ref 70–99)

## 2019-08-21 MED ORDER — APIXABAN 5 MG PO TABS
5.0000 mg | ORAL_TABLET | Freq: Two times a day (BID) | ORAL | Status: DC
  Filled 2019-08-21: qty 1

## 2019-08-21 MED ORDER — DARBEPOETIN ALFA 100 MCG/0.5ML IJ SOSY
100.0000 ug | PREFILLED_SYRINGE | INTRAMUSCULAR | Status: DC
  Administered 2019-08-21: 100 ug via SUBCUTANEOUS
  Filled 2019-08-21: qty 0.5

## 2019-08-21 MED ORDER — APIXABAN 5 MG PO TABS
5.0000 mg | ORAL_TABLET | Freq: Two times a day (BID) | ORAL | Status: DC
  Administered 2019-08-21 – 2019-08-25 (×8): 5 mg via ORAL
  Filled 2019-08-21 (×8): qty 1

## 2019-08-21 MED ORDER — POTASSIUM CHLORIDE CRYS ER 20 MEQ PO TBCR
40.0000 meq | EXTENDED_RELEASE_TABLET | Freq: Two times a day (BID) | ORAL | Status: AC
  Administered 2019-08-21 (×2): 40 meq via ORAL
  Filled 2019-08-21 (×2): qty 2

## 2019-08-21 NOTE — Progress Notes (Signed)
Urology Inpatient Progress Report  Small bowel obstruction ([place]) [K56.609]  Procedure(s): laparoscopic incisional hernia repair  9 Days Post-Op   Intv/Subj: Nursing reported a "fall" to me yesterday but explained that the patient started to fall and she was caught and slowly lowered to the ground.  No injuries noted.  Creatinine continues to worsen and is now 5.83.  Leukocytosis stable at 13.4.  Hemoglobin stable.  Abdominal exam stable to improved.  Urine output picked up a little bit, 650/300.  She has been afebrile vital signs stable.  She reports flatus and she reported a bowel movement last night.  Active Problems:   Seizures ([place])   Incisional hernia with gangrene and obstruction   Atrial fibrillation with RVR ([facility])   Hypothyroidism  Current Facility-Administered Medications  Medication Dose Route Frequency Provider Last Rate Last Admin  . [START ON 08/22/2019] apixaban (ELIQUIS) tablet 5 mg  5 mg Oral BID Bonnielee Haff, MD      . cefTRIAXone (ROCEPHIN) 2 g in sodium chloride 0.9 % 100 mL IVPB  2 g Intravenous QHS Marton Redwood III, MD 200 mL/hr at 08/20/19 2123 2 g at 08/20/19 2123  . Chlorhexidine Gluconate Cloth 2 % PADS 6 each  6 each Topical Daily Marton Redwood III, MD   6 each at 08/21/19 0602  . diltiazem (CARDIZEM CD) 24 hr capsule 180 mg  180 mg Oral Daily Bonnielee Haff, MD   180 mg at 08/20/19 1254  . diphenhydrAMINE (BENADRYL) injection 12.5 mg  12.5 mg Intravenous Q6H PRN Marton Redwood III, MD   12.5 mg at 08/17/19 2106   Or  . diphenhydrAMINE (BENADRYL) 12.5 MG/5ML elixir 12.5 mg  12.5 mg Oral Q6H PRN Marton Redwood III, MD      . ezetimibe (ZETIA) tablet 10 mg  10 mg Oral Daily Marton Redwood III, MD   10 mg at 08/20/19 1255  . FLUoxetine (PROZAC) capsule 10 mg  10 mg Oral Daily Bonnielee Haff, MD   10 mg at 08/20/19 1255  . HYDROmorphone (DILAUDID) injection 0.5-1 mg  0.5-1 mg Intravenous Q2H PRN Marton Redwood III, MD   1 mg at 08/18/19 2111  . labetalol  (NORMODYNE) injection 10 mg  10 mg Intravenous Q2H PRN Bonnielee Haff, MD   10 mg at 08/16/19 0104  . lamoTRIgine (LAMICTAL) tablet 75 mg  75 mg Oral BID Bonnielee Haff, MD   75 mg at 08/20/19 2115  . levETIRAcetam (KEPPRA) tablet 1,500 mg  1,500 mg Oral BID Bonnielee Haff, MD   1,500 mg at 08/20/19 2116  . levothyroxine (SYNTHROID) tablet 137 mcg  137 mcg Oral Q0600 Marton Redwood III, MD   137 mcg at 08/21/19 0602  . MEDLINE mouth rinse  15 mL Mouth Rinse BID Marton Redwood III, MD   15 mL at 08/20/19 2117  . metoCLOPramide (REGLAN) injection 5 mg  5 mg Intravenous Marella Chimes, MD   5 mg at 08/21/19 4765  . ondansetron (ZOFRAN) injection 4 mg  4 mg Intravenous Q4H PRN Marton Redwood III, MD   4 mg at 08/18/19 0756  . oxyCODONE (Oxy IR/ROXICODONE) immediate release tablet 5 mg  5 mg Oral Q4H PRN Marton Redwood III, MD   5 mg at 08/20/19 2116  . pantoprazole (PROTONIX) EC tablet 40 mg  40 mg Oral BID Minda Ditto, RPH   40 mg at 08/20/19 2116  . phenol (CHLORASEPTIC) mouth spray 1 spray  1 spray Mouth/Throat  PRN Festus Aloe, MD   1 spray at 08/18/19 1311  . polyethylene glycol (MIRALAX / GLYCOLAX) packet 17 g  17 g Oral Daily Lang Snow, FNP   17 g at 08/20/19 1252  . rosuvastatin (CRESTOR) tablet 40 mg  40 mg Oral Daily Marton Redwood III, MD   40 mg at 08/20/19 1253  . senna-docusate (Senokot-S) tablet 1 tablet  1 tablet Oral BID [name], MD   1 tablet at 08/20/19 2116  . sodium bicarbonate 150 mEq in dextrose 5% 1000 mL infusion  150 mEq Intravenous Continuous [name], MD 75 mL/hr at 08/20/19 1750 150 mEq at 08/20/19 1750  . sodium chloride flush (NS) 0.9 % injection 10-40 mL  10-40 mL Intracatheter Q12H Marton Redwood III, MD   10 mL at 08/20/19 2117  . sodium chloride flush (NS) 0.9 % injection 10-40 mL  10-40 mL Intracatheter PRN Marton Redwood III, MD      . zolpidem [facility]) tablet 5 mg  5 mg Oral QHS PRN Marton Redwood III, MD   5 mg at 08/20/19 2116      Objective: Vital: Vitals:   08/20/19 1220 08/20/19 1443 08/20/19 2011 08/21/19 0637  BP: (!) 137/57 123/66 (!) 151/72 (!) 140/118  Pulse: 81 88 94 78  Resp: 18 20 20 16   Temp: 99.3 F (37.4 C) 98.1 F (36.7 C) 99 F (37.2 C) 98.7 F (37.1 C)  TempSrc: Oral Oral Oral Oral  SpO2: 100% 96% 96% 98%  Weight:      Height:       I/Os: I/O last 3 completed shifts: In: 1345.7 [P.O.:320; I.V.:925.7; IV Piggyback:100] Out: 1075 [Urine:1075]  Physical Exam:  General: Patient is in no apparent distress Lungs: Normal respiratory effort, chest expands symmetrically. GI: Incisions are c/d/i. The abdomen is soft and mildly tender and mildly to moderately distended without mass.  Stable to improved exam Ext: lower extremities symmetric  Lab Results: Recent Labs    08/19/19 0459 08/20/19 0454 08/21/19 0512  WBC 18.2* 14.3* 13.4*  HGB 9.1* 7.8* 8.7*  HCT 29.3* 24.6* 26.4*   Recent Labs    08/19/19 0459 08/20/19 0454 08/21/19 0530  NA 140 136 143  K 4.0 2.9* 2.9*  CL 115* 103 105  CO2 14* 23 27  GLUCOSE 98 141* 109*  BUN 14 19 20   CREATININE 3.73* 4.89* 5.83*  CALCIUM 7.9* 7.4* 7.4*   No results for input(s): LABPT, INR in the last 72 hours. No results for input(s): LABURIN in the last 72 hours. Results for orders placed or performed during the hospital encounter of 08/12/19  Urine Culture     Status: None   Collection Time: 08/12/19 11:18 AM   Specimen: Urine, Clean Catch  Result Value Ref Range Status   Specimen Description   Final    URINE, CLEAN CATCH Performed at [facility], [address] [place], [place] [address]    Special Requests   Final    NONE Performed at [facility], [address], [place], [place] [address]    Culture   Final    NO GROWTH Performed at [facility], [place] [address], [place], [place] [address]    Report Status 08/13/2019 FINAL  Final  Blood Culture (routine x 2)     Status: None   Collection Time: 08/12/19 11:32 AM    Specimen: BLOOD LEFT ARM  Result Value Ref Range Status   Specimen Description BLOOD LEFT ARM  Final  Special Requests   Final    BOTTLES DRAWN AEROBIC AND ANAEROBIC Blood Culture results may not be optimal due to an excessive volume of blood received in culture bottles   Culture   Final    NO GROWTH 5 DAYS Performed at [facility], [address], [place], [place] [address]    Report Status 08/17/2019 FINAL  Final  SARS Coronavirus 2 by RT PCR (hospital order, performed in [facility] hospital lab) Nasopharyngeal Nasopharyngeal Swab     Status: None   Collection Time: 08/12/19  1:41 PM   Specimen: Nasopharyngeal Swab  Result Value Ref Range Status   SARS Coronavirus 2 NEGATIVE NEGATIVE Final    Comment: (NOTE) SARS-CoV-2 target nucleic acids are NOT DETECTED.  The SARS-CoV-2 RNA is generally detectable in upper and lower respiratory specimens during the acute phase of infection. The lowest concentration of SARS-CoV-2 viral copies this assay can detect is 250 copies / mL. A negative result does not preclude SARS-CoV-2 infection and should not be used as the sole basis for treatment or other patient management decisions.  A negative result may occur with improper specimen collection / handling, submission of specimen other than nasopharyngeal swab, presence of viral mutation(s) within the areas targeted by this assay, and inadequate number of viral copies (<250 copies / mL). A negative result must be combined with clinical observations, patient history, and epidemiological information.  Fact Sheet for Patients:   StrictlyIdeas.no  Fact Sheet for Healthcare Providers: BankingDealers.co.za  This test is not yet approved or  cleared by the [place] FDA and has been authorized for detection and/or diagnosis of SARS-CoV-2 by FDA under an Emergency Use Authorization (EUA).  This EUA will remain in effect (meaning this test can be  used) for the duration of the COVID-19 declaration under Section 564(b)(1) of the Act, 21 U.S.C. section 360bbb-3(b)(1), unless the authorization is terminated or revoked sooner.  Performed at [facility], [address] [place], [place] [address]   Surgical PCR screen     Status: None   Collection Time: 08/12/19  7:05 PM   Specimen: Nasal Mucosa; Nasal Swab  Result Value Ref Range Status   MRSA, PCR NEGATIVE NEGATIVE Final   Staphylococcus aureus NEGATIVE NEGATIVE Final    Comment: (NOTE) The Xpert SA Assay (FDA approved for NASAL specimens in patients 50 years of age and older), is one component of a comprehensive surveillance program. It is not intended to diagnose infection nor to guide or monitor treatment. Performed at [facility], [place] [address] [place], [place] [address]   Culture, Urine     Status: None   Collection Time: 08/19/19  9:40 AM   Specimen: Urine, Catheterized  Result Value Ref Range Status   Specimen Description   Final    URINE, CATHETERIZED Performed at [place] [address], [place], [place] [address]    Special Requests   Final    NONE Performed at [facility], [place] [address] [place], [place] [address]    Culture   Final    NO GROWTH Performed at [facility], [place] [address], [place], [place] [address]    Report Status 08/20/2019 FINAL  Final    Studies/Results: No results found.  Assessment: Port site hernia Acute renal insufficiency on top of CKD stage III, and worsening Anemia, stable Atrial fibrillation, improved  Procedure(s): laparoscopic incisional hernia repair, 9 Days Post-Op  doing well.  Plan: I will obtain a  renal ultrasound given her worsening creatinine.  Urine culture x2 with no growth.  Blood culture on 6/18 with no growth.  I will discontinue ceftriaxone.  Appreciate nephrology assistance with acute renal insufficiency.  Currently on sodium  bicarbonate 75 cc/h.  Appreciate hospitalist assistance with hypertension and atrial fibrillation.  Okay to start back anticoagulation for atrial fibrillation from my standpoint.   Link Snuffer, MD Urology 08/21/2019, 8:49 AM

## 2019-08-21 NOTE — Progress Notes (Signed)
Subjective:  Called back 6/25 to see pt for worsening renal function again.   crt worsened again overnight- now 5.8- UOP picking back up.  BP is not low- urine only 6-10 WBC- culture negative ate some breakfast-  Pretty weak but does not seem obviously uremic   Objective Vital signs in last 24 hours: Vitals:   08/20/19 1220 08/20/19 1443 08/20/19 2011 08/21/19 0637  BP: (!) 137/57 123/66 (!) 151/72 (!) 140/118  Pulse: 81 88 94 78  Resp: 18 20 20 16   Temp: 99.3 F (37.4 C) 98.1 F (36.7 C) 99 F (37.2 C) 98.7 F (37.1 C)  TempSrc: Oral Oral Oral Oral  SpO2: 100% 96% 96% 98%  Weight:      Height:       Weight change:   Intake/Output Summary (Last 24 hours) at 08/21/2019 0946 Last data filed at 08/21/2019 0000 Gross per 24 hour  Intake 320 ml  Output 950 ml  Net -630 ml    Assessment/ Plan: Pt is a 64 y.o. yo female with HTN, obesity, sz d/o  who was admitted on 08/12/2019 with lap robotic assisted partial nephrect c/b re operation for hernia/SBO c/b AKI Assessment/Plan: 1. Renal-  crt in mid to high 1's pre partial nephrectomy. Then with AKI felt to be hemodynamically mediated-   Peaked at close to 6-  Then slow improvement to nadir 2.7-  But worsening the last 4 days - nonoliguric initially , then UOP down, now back up-  The things that I noticed were a soft BP and history of  UTI-- have decreased dose of cardizem since now in NSR- BP is higher-  recheck U/A not that remarkable- no absolute indications for HD but dont love the trend- hope now that BP is better and UOP is up that renal function will plateau again- still waiting-  Urology has ordered renal u/s to rule out obstruction- will follow up   2. Metabolic acidosis-  giving some IV bicarb.  Lactate OK-  says is passing gas and had BP--- acidosis improved   3. Anemia- fairly had been stable- 8-9-   Down further but a little variable -  Give ESA dose  4. HTN/volume- soft BP- have dec cardizem-  Is better 5. Hypokalemia- give  supp today-  40 BID  [name]    Labs: Basic Metabolic Panel: Recent Labs  Lab 08/19/19 0459 08/20/19 0454 08/21/19 0530  NA 140 136 143  K 4.0 2.9* 2.9*  CL 115* 103 105  CO2 14* 23 27  GLUCOSE 98 141* 109*  BUN 14 19 20   CREATININE 3.73* 4.89* 5.83*  CALCIUM 7.9* 7.4* 7.4*  PHOS  --   --  2.7   Liver Function Tests: Recent Labs  Lab 08/21/19 0530  ALBUMIN 2.0*   No results for input(s): LIPASE, AMYLASE in the last 168 hours. No results for input(s): AMMONIA in the last 168 hours. CBC: Recent Labs  Lab 08/17/19 0743 08/17/19 0743 08/18/19 0216 08/18/19 0216 08/19/19 0459 08/20/19 0454 08/21/19 0512  WBC 7.1   < > 8.6   < > 18.2* 14.3* 13.4*  NEUTROABS 4.9   < > 5.6  --  13.5* 10.7*  --   HGB 8.5*   < > 9.2*   < > 9.1* 7.8* 8.7*  HCT 27.8*   < > 29.5*   < > 29.3* 24.6* 26.4*  MCV 88.5  --  87.3  --  87.2 85.7 84.1  PLT 293   < >  321   < > 287 302 270   < > = values in this interval not displayed.   Cardiac Enzymes: No results for input(s): CKTOTAL, CKMB, CKMBINDEX, TROPONINI in the last 168 hours. CBG: Recent Labs  Lab 08/20/19 0514 08/20/19 1211 08/20/19 1828 08/21/19 0001 08/21/19 0634  GLUCAP 111* 110* 116* 108* 117*    Iron Studies: No results for input(s): IRON, TIBC, TRANSFERRIN, FERRITIN in the last 72 hours. Studies/Results: No results found. Medications: Infusions: . cefTRIAXone (ROCEPHIN)  IV 2 g (08/20/19 2123)  . sodium bicarbonate 150 mEq in dextrose 5% 1000 mL 150 mEq (08/20/19 1750)    Scheduled Medications: . [START ON 08/22/2019] apixaban  5 mg Oral BID  . Chlorhexidine Gluconate Cloth  6 each Topical Daily  . diltiazem  180 mg Oral Daily  . ezetimibe  10 mg Oral Daily  . FLUoxetine  10 mg Oral Daily  . lamoTRIgine  75 mg Oral BID  . levETIRAcetam  1,500 mg Oral BID  . levothyroxine  137 mcg Oral Q0600  . mouth rinse  15 mL Mouth Rinse BID  . metoCLOPramide (REGLAN) injection  5 mg Intravenous Q8H  .  pantoprazole  40 mg Oral BID  . polyethylene glycol  17 g Oral Daily  . rosuvastatin  40 mg Oral Daily  . senna-docusate  1 tablet Oral BID  . sodium chloride flush  10-40 mL Intracatheter Q12H    have reviewed scheduled and prn medications.  Physical Exam: General: alert, NAD Heart: RRR Lungs: mostly clear Abdomen: distended- some tenderness on right-  Incisions clean Extremities: min edema-  purewick-  More urine and lighter     08/21/2019,9:46 AM  LOS: 9 days

## 2019-08-21 NOTE — Progress Notes (Addendum)
[facility] PROGRESS NOTE   [name] XFG:[id number] DOB: [birth year] DOA: 08/12/2019  PCP: [name], MD  Brief History/Interval Summary: 64 y.o. female with history of hypertension, seizure disorder, hyperlipidemia, chronic kidney disease stage IIIb, papillary thyroid carcinoma status post thyroidectomy, and and right renal mass status post laparoscopic partial nephrectomy on 08/05/2019 who returned to the emergency department on 08/12/2019 with 2 to 3 days of nausea and vomiting, was found to have an acute kidney injury and partial SBO secondary to incisional hernia.  Patient was admitted to the urology service and taken to the operating room that same day for port site incisional hernia repair.  On the evening of 6/28 patient was found to have heart rate in the 1 30-1 40 range.  EKG showed atrial fibrillation with RVR.  Patient was transferred to the ICU.  Hospitalist was consulted.   Procedures:  Transthoracic echocardiogram 6/21 1. Hyperdynamic LV systolic function; elevated mean gradient across  aortic valve (19 mmHg) suggests mild AS; however hyperdynamic LV function  at least partially contributing; trace AI; mild TR with severely elevated  pulmonary pressure.  2. Left ventricular ejection fraction, by estimation, is >75%. The left  ventricle has hyperdynamic function. The left ventricle has no regional  wall motion abnormalities. Left ventricular diastolic parameters were  normal.  3. Right ventricular systolic function is normal. The right ventricular  size is normal. There is severely elevated pulmonary artery systolic  pressure.  4. The mitral valve is normal in structure. Trivial mitral valve  regurgitation. No evidence of mitral stenosis.  5. The aortic valve is tricuspid. Aortic valve regurgitation is trivial.  Mild aortic valve stenosis.  6. The inferior vena cava is normal in size with greater than 50%  respiratory variability, suggesting  right atrial pressure of 3 mmHg.   Antibiotics: Anti-infectives (From admission, onward)   Start     Dose/Rate Route Frequency Ordered Stop   08/12/19 2300  cefTRIAXone (ROCEPHIN) 2 g in sodium chloride 0.9 % 100 mL IVPB  Status:  Discontinued        2 g 200 mL/hr over 30 Minutes Intravenous Daily at bedtime 08/12/19 2226 08/21/19 1112   08/12/19 1145  cefTRIAXone (ROCEPHIN) 1 g in sodium chloride 0.9 % 100 mL IVPB        1 g 200 mL/hr over 30 Minutes Intravenous  Once 08/12/19 1140 08/12/19 1320      Subjective/Interval History: Patient complains of some abdominal pain but similar to yesterday.  Passing gas from below.  Denies any nausea or vomiting.      Assessment/Plan:  Paroxysmal atrial fibrillation Patient developed atrial fibrillation with RVR.  She was transferred to the ICU and placed on a Cardizem infusion.  She subsequently converted to sinus rhythm. TSH was normal.  Echocardiogram shows normal systolic function.  Significantly elevated pulmonary pressure is noted.   She also had some pleuritic chest pain initially.  D-dimer was elevated.  VQ scan is low probability for PE.  Doppler studies negative for DVT.  No further work-up at this time. Chads 2 vascular score is 3.  Oral anticoagulation is recommended.  Discussed with the patient and her daughter in detail.  They were agreeable.  Patient was subsequently started on Eliquis after discussing with urology.  Patient remains in sinus rhythm.  Continue current dose of Cardizem.  Continue to monitor electrolytes.   Due to drop in hemoglobin her Eliquis was held yesterday.  Urology has cleared for Eliquis to be  resumed.  Will start from tonight. She will need outpatient referral to cardiology and pulmonology in the next 1 to 2 months.  She lives in the [place] area.  This can be arranged by her PCP.    Acute kidney injury superimposed on chronic kidney disease stage IIIb/hypokalemia Baseline creatinine is around 1.5-1.7.   Presented with a creatinine of 5.89.  Nephrology was involved.  Patient's creatinine had improved but worsened starting 6/24.   Nephrology was reconsulted.  Patient was started on a bicarbonate infusion.  Creatinine continues to climb.  However it does appear that her urine output has picked up.  Management per nephrology. Defer repletion of potassium to nephrology.  Small bowel obstruction secondary to incisional hernia status post repair on 08/12/2019 Management deferred to the primary team.    Essential hypertension Her antihypertensives including ACE inhibitor and Norvasc were held.  Blood pressure was quite poorly controlled a few days ago.  Was just on Cardizem.  Dose had to be reduced due to low blood pressures.  Stable now.  Hypothyroidism TSH was normal.  She is on levothyroxine.  Normocytic anemia Hemoglobin did not drop any further today.  No overt bleeding.  Continue to monitor.  Continue to hold Eliquis for now.     History of seizure disorder Continue Keppra and Lamictal.  No seizure activity here in the hospital.  May need to switch the Keppra back to intravenous depending on her SBO status.  Morbid obesity Estimated body mass index is 39.95 kg/m as calculated from the following:   Height as of this encounter: 5\' 5"  (1.651 m).   Weight as of this encounter: 108.9 kg.   DVT Prophylaxis: SCDs plus Eliquis.   Code Status: Full code   Medications:  Scheduled: . [START ON 08/22/2019] apixaban  5 mg Oral BID  . Chlorhexidine Gluconate Cloth  6 each Topical Daily  . darbepoetin (ARANESP) injection - NON-DIALYSIS  100 mcg Subcutaneous Q Sun-1800  . diltiazem  180 mg Oral Daily  . ezetimibe  10 mg Oral Daily  . FLUoxetine  10 mg Oral Daily  . lamoTRIgine  75 mg Oral BID  . levETIRAcetam  1,500 mg Oral BID  . levothyroxine  137 mcg Oral Q0600  . mouth rinse  15 mL Mouth Rinse BID  . metoCLOPramide (REGLAN) injection  5 mg Intravenous Q8H  . pantoprazole  40 mg Oral BID    . polyethylene glycol  17 g Oral Daily  . potassium chloride  40 mEq Oral BID  . rosuvastatin  40 mg Oral Daily  . senna-docusate  1 tablet Oral BID  . sodium chloride flush  10-40 mL Intracatheter Q12H   Continuous: . sodium bicarbonate 150 mEq in dextrose 5% 1000 mL 150 mEq (08/20/19 1750)   HKV:[id number] **OR** diphenhydrAMINE, HYDROmorphone (DILAUDID) injection, labetalol, ondansetron, oxyCODONE, phenol, sodium chloride flush, zolpidem   Objective:  Vital Signs  Vitals:   08/20/19 1220 08/20/19 1443 08/20/19 2011 08/21/19 0637  BP: (!) 137/57 123/66 (!) 151/72 (!) 140/118  Pulse: 81 88 94 78  Resp: 18 20 20 16   Temp: 99.3 F (37.4 C) 98.1 F (36.7 C) 99 F (37.2 C) 98.7 F (37.1 C)  TempSrc: Oral Oral Oral Oral  SpO2: 100% 96% 96% 98%  Weight:      Height:        Intake/Output Summary (Last 24 hours) at 08/21/2019 1117 Last data filed at 08/21/2019 0000 Gross per 24 hour  Intake 320 ml  Output 950 ml  Net -630 ml   Filed Weights   08/12/19 1041 08/12/19 2224  Weight: 106.1 kg 108.9 kg    General appearance: Awake alert.  In no distress Resp: Diminished air entry at the bases.  No wheezing or rhonchi.  Normal effort noted.   Cardio: S1-S2 is normal regular.  No S3-S4.  No rubs murmurs or bruit GI: Abdomen is distended.  Soft.  Mildly tender diffusely without any rebound rigidity or guarding.  Bowel sounds sluggish but present.   Neurologic:  No focal neurological deficits.    Lab Results:  Data Reviewed: I have personally reviewed following labs and imaging studies  CBC: Recent Labs  Lab 08/16/19 0330 08/16/19 0330 08/17/19 0743 08/18/19 0216 08/19/19 0459 08/20/19 0454 08/21/19 0512  WBC 7.5   < > 7.1 8.6 18.2* 14.3* 13.4*  NEUTROABS 5.3  --  4.9 5.6 13.5* 10.7*  --   HGB 8.2*   < > 8.5* 9.2* 9.1* 7.8* 8.7*  HCT 26.3*   < > 27.8* 29.5* 29.3* 24.6* 26.4*  MCV 87.1   < > 88.5 87.3 87.2 85.7 84.1  PLT 272   < > 293 321 287 302 270   < > =  values in this interval not displayed.    Basic Metabolic Panel: Recent Labs  Lab 08/15/19 0821 08/16/19 0330 08/17/19 0743 08/18/19 0216 08/19/19 0459 08/20/19 0454 08/21/19 0530  NA  --    < > 143 143 140 136 143  K  --    < > 3.0* 3.5 4.0 2.9* 2.9*  CL  --    < > 112* 113* 115* 103 105  CO2  --    < > 23 20* 14* 23 27  GLUCOSE  --    < > 122* 112* 98 141* 109*  BUN  --    < > 17 11 14 19 20   CREATININE  --    < > 2.78* 2.87* 3.73* 4.89* 5.83*  CALCIUM  --    < > 8.1* 8.3* 7.9* 7.4* 7.4*  MG 1.7  --   --   --   --   --   --   PHOS  --   --   --   --   --   --  2.7   < > = values in this interval not displayed.    GFR: Estimated Creatinine Clearance: 11.8 mL/min (A) (by C-G formula based on SCr of 5.83 mg/dL (H)).   CBG: Recent Labs  Lab 08/20/19 0514 08/20/19 1211 08/20/19 1828 08/21/19 0001 08/21/19 0634  GLUCAP 111* 110* 116* 108* 117*      Recent Results (from the past 240 hour(s))  Urine Culture     Status: None   Collection Time: 08/12/19 11:18 AM   Specimen: Urine, Clean Catch  Result Value Ref Range Status   Specimen Description   Final    URINE, CLEAN CATCH Performed at [facility], [address], [place], [place] [address]    Special Requests   Final    NONE Performed at [facility], [address] [place], [place] [address]    Culture   Final    NO GROWTH Performed at [facility], [place] [address], [place], [place] [address]    Report Status 08/13/2019 FINAL  Final  Blood Culture (routine x 2)     Status: None   Collection Time: 08/12/19 11:32 AM   Specimen: BLOOD LEFT ARM  Result Value Ref Range Status  Specimen Description BLOOD LEFT ARM  Final   Special Requests   Final    BOTTLES DRAWN AEROBIC AND ANAEROBIC Blood Culture results may not be optimal due to an excessive volume of blood received in culture bottles   Culture   Final    NO GROWTH 5 DAYS Performed at [facility], [address] [place], [place] [address]     Report Status 08/17/2019 FINAL  Final  SARS Coronavirus 2 by RT PCR (hospital order, performed in [facility] hospital lab) Nasopharyngeal Nasopharyngeal Swab     Status: None   Collection Time: 08/12/19  1:41 PM   Specimen: Nasopharyngeal Swab  Result Value Ref Range Status   SARS Coronavirus 2 NEGATIVE NEGATIVE Final    Comment: (NOTE) SARS-CoV-2 target nucleic acids are NOT DETECTED.  The SARS-CoV-2 RNA is generally detectable in upper and lower respiratory specimens during the acute phase of infection. The lowest concentration of SARS-CoV-2 viral copies this assay can detect is 250 copies / mL. A negative result does not preclude SARS-CoV-2 infection and should not be used as the sole basis for treatment or other patient management decisions.  A negative result may occur with improper specimen collection / handling, submission of specimen other than nasopharyngeal swab, presence of viral mutation(s) within the areas targeted by this assay, and inadequate number of viral copies (<250 copies / mL). A negative result must be combined with clinical observations, patient history, and epidemiological information.  Fact Sheet for Patients:   StrictlyIdeas.no  Fact Sheet for Healthcare Providers: BankingDealers.co.za  This test is not yet approved or  cleared by the [place] FDA and has been authorized for detection and/or diagnosis of SARS-CoV-2 by FDA under an Emergency Use Authorization (EUA).  This EUA will remain in effect (meaning this test can be used) for the duration of the COVID-19 declaration under Section 564(b)(1) of the Act, 21 U.S.C. section 360bbb-3(b)(1), unless the authorization is terminated or revoked sooner.  Performed at [facility], [address], [place], [place] [address]   Surgical PCR screen     Status: None   Collection Time: 08/12/19  7:05 PM   Specimen: Nasal Mucosa; Nasal Swab  Result Value Ref  Range Status   MRSA, PCR NEGATIVE NEGATIVE Final   Staphylococcus aureus NEGATIVE NEGATIVE Final    Comment: (NOTE) The Xpert SA Assay (FDA approved for NASAL specimens in patients 24 years of age and older), is one component of a comprehensive surveillance program. It is not intended to diagnose infection nor to guide or monitor treatment. Performed at [facility], [place] [address] [place], [place] [address]   Culture, Urine     Status: None   Collection Time: 08/19/19  9:40 AM   Specimen: Urine, Catheterized  Result Value Ref Range Status   Specimen Description   Final    URINE, CATHETERIZED Performed at [place] [address] [place], [place] [address]    Special Requests   Final    NONE Performed at [facility], [place] [address], [place], [place] [address]    Culture   Final    NO GROWTH Performed at [facility], [place] [address], [place],  [address]    Report Status 08/20/2019 FINAL  Final      Radiology Studies: US RENAL  Result Date: 08/21/2019 CLINICAL DATA:  Acute renal insufficiency. Had a partial right nephrectomy for a right renal mass on 08/05/2019. EXAM: RENAL / URINARY TRACT  ULTRASOUND COMPLETE COMPARISON:  CT, 08/12/2019 FINDINGS: Right Kidney: Renal measurements: 10.5 x 5.3 x 6.8 cm = volume: 196.7 mL. Increased and heterogeneous parenchymal echogenicity. Irregular cystic area noted in the midpole measuring 2.1 x 1.4 x 2.0 cm, which is likely a postoperative collection. No other renal mass small or lesion. 8 mm echogenic lesion projects in the mid pole which could reflect a stone. Focus of collecting system air is also possible. No hydronephrosis. Left Kidney: Renal measurements: 11.5 x 6.6 x 6.7 cm = volume: 267.9 mL. Increased parenchymal echogenicity. No mass, stone or hydronephrosis. Bladder: Appears normal for degree of bladder distention. Other: None. IMPRESSION: 1. Bilateral increased  renal parenchymal echogenicity consistent with medical renal disease. 2. No hydronephrosis. 3. 2.1 cm irregular cystic lesion in the midpole the right kidney that likely reflects postoperative fluid from the previous partial nephrectomy. A small focus of increased echogenicity in the midpole the right kidney is most likely due to air, which was evident within the kidney on the prior CT. A stone is possible, but no stone was noted in the right kidney on the prior CT. Electronically Signed   By: Lajean Manes M.D.   On: 08/21/2019 10:08       LOS: 9 days   Idell Hissong [employer] on [email or web address]  08/21/2019, 11:17 AM

## 2019-08-22 DIAGNOSIS — I10 Essential (primary) hypertension: Secondary | ICD-10-CM | POA: Diagnosis not present

## 2019-08-22 DIAGNOSIS — I480 Paroxysmal atrial fibrillation: Secondary | ICD-10-CM | POA: Diagnosis not present

## 2019-08-22 LAB — CBC
HCT: 23.8 % — ABNORMAL LOW (ref 36.0–46.0)
Hemoglobin: 7.9 g/dL — ABNORMAL LOW (ref 12.0–15.0)
MCH: 27.2 pg (ref 26.0–34.0)
MCHC: 33.2 g/dL (ref 30.0–36.0)
MCV: 82.1 fL (ref 80.0–100.0)
Platelets: 338 10*3/uL (ref 150–400)
RBC: 2.9 MIL/uL — ABNORMAL LOW (ref 3.87–5.11)
RDW: 15.6 % — ABNORMAL HIGH (ref 11.5–15.5)
WBC: 9.5 10*3/uL (ref 4.0–10.5)
nRBC: 0.2 % (ref 0.0–0.2)

## 2019-08-22 LAB — RENAL FUNCTION PANEL
Albumin: 1.7 g/dL — ABNORMAL LOW (ref 3.5–5.0)
Anion gap: 12 (ref 5–15)
BUN: 22 mg/dL (ref 8–23)
CO2: 28 mmol/L (ref 22–32)
Calcium: 7.5 mg/dL — ABNORMAL LOW (ref 8.9–10.3)
Chloride: 103 mmol/L (ref 98–111)
Creatinine, Ser: 6.39 mg/dL — ABNORMAL HIGH (ref 0.44–1.00)
GFR calc Af Amer: 7 mL/min — ABNORMAL LOW (ref >60)
GFR calc non Af Amer: 6 mL/min — ABNORMAL LOW (ref >60)
Glucose, Bld: 100 mg/dL — ABNORMAL HIGH (ref 70–99)
Phosphorus: 2.6 mg/dL (ref 2.5–4.6)
Potassium: 4.3 mmol/L (ref 3.5–5.1)
Sodium: 143 mmol/L (ref 135–145)

## 2019-08-22 LAB — GLUCOSE, CAPILLARY
Glucose-Capillary: 101 mg/dL — ABNORMAL HIGH (ref 70–99)
Glucose-Capillary: 124 mg/dL — ABNORMAL HIGH (ref 70–99)
Glucose-Capillary: 95 mg/dL (ref 70–99)

## 2019-08-22 NOTE — Progress Notes (Signed)
[facility] PROGRESS NOTE   [name] GNO:[id number] DOB: 1955/04/09 DOA: 08/12/2019  PCP: [name], MD  Brief History/Interval Summary: 64 y.o. female with history of hypertension, seizure disorder, hyperlipidemia, chronic kidney disease stage IIIb, papillary thyroid carcinoma status post thyroidectomy, and and right renal mass status post laparoscopic partial nephrectomy on 08/05/2019 who returned to the emergency department on 08/12/2019 with 2 to 3 days of nausea and vomiting, was found to have an acute kidney injury and partial SBO secondary to incisional hernia.  Patient was admitted to the urology service and taken to the operating room that same day for port site incisional hernia repair.  On the evening of 6/28 patient was found to have heart rate in the 1 30-1 40 range.  EKG showed atrial fibrillation with RVR.  Patient was transferred to the ICU.  Hospitalist was consulted.   Procedures:  Transthoracic echocardiogram 6/21 1. Hyperdynamic LV systolic function; elevated mean gradient across  aortic valve (19 mmHg) suggests mild AS; however hyperdynamic LV function  at least partially contributing; trace AI; mild TR with severely elevated  pulmonary pressure.  2. Left ventricular ejection fraction, by estimation, is >75%. The left  ventricle has hyperdynamic function. The left ventricle has no regional  wall motion abnormalities. Left ventricular diastolic parameters were  normal.  3. Right ventricular systolic function is normal. The right ventricular  size is normal. There is severely elevated pulmonary artery systolic  pressure.  4. The mitral valve is normal in structure. Trivial mitral valve  regurgitation. No evidence of mitral stenosis.  5. The aortic valve is tricuspid. Aortic valve regurgitation is trivial.  Mild aortic valve stenosis.  6. The inferior vena cava is normal in size with greater than 50%  respiratory variability, suggesting  right atrial pressure of 3 mmHg.   Antibiotics: Anti-infectives (From admission, onward)   Start     Dose/Rate Route Frequency Ordered Stop   08/12/19 2300  cefTRIAXone (ROCEPHIN) 2 g in sodium chloride 0.9 % 100 mL IVPB  Status:  Discontinued        2 g 200 mL/hr over 30 Minutes Intravenous Daily at bedtime 08/12/19 2226 08/21/19 1112   08/12/19 1145  cefTRIAXone (ROCEPHIN) 1 g in sodium chloride 0.9 % 100 mL IVPB        1 g 200 mL/hr over 30 Minutes Intravenous  Once 08/12/19 1140 08/12/19 1320      Subjective/Interval History: Patient denies any complaints this morning.  Denies any abdominal pain specifically.  No nausea vomiting.  Passing gas but has not had a bowel movement yet.  No chest pain or shortness of breath.    Assessment/Plan:  Paroxysmal atrial fibrillation Patient developed atrial fibrillation with RVR.  She was transferred to the ICU and placed on a Cardizem infusion.  She subsequently converted to sinus rhythm. TSH was normal.  Echocardiogram shows normal systolic function.  Significantly elevated pulmonary pressure is noted.   She also had some pleuritic chest pain initially.  D-dimer was elevated.  VQ scan is low probability for PE.  Doppler studies negative for DVT.  No further work-up at this time. Chads 2 vascular score is 3.  Oral anticoagulation is recommended.  Discussed with the patient and her daughter in detail.  They were agreeable.  Patient was subsequently started on Eliquis after discussing with urology.  Remains in sinus rhythm.  Continue current dose of Cardizem.  Eliquis was held due to drop in hemoglobin which was resumed last night  after urology cleared her to resume it. She will need outpatient referral to cardiology and pulmonology in the next 1 to 2 months.  She lives in the [place] area.  This can be arranged by her PCP.    Acute kidney injury superimposed on chronic kidney disease stage IIIb/hypokalemia Baseline creatinine is around 1.5-1.7.   Presented with a creatinine of 5.89.  Nephrology was involved.  Patient's creatinine had improved but worsened starting 6/24.   Nephrology was reconsulted.  Patient was started on a bicarbonate infusion.  Creatinine continues to climb but her urine output appears to have picked up.  Management per nephrology.  Potassium noted to be normal today.   Small bowel obstruction secondary to incisional hernia status post repair on 08/12/2019 Management deferred to the primary team.    Essential hypertension Her antihypertensives including ACE inhibitor and Norvasc were held.  Now only on Cardizem with which blood pressure seems to be adequately well controlled.  The dose had to be reduced a few days ago due to borderline low blood pressures.  Hypothyroidism TSH was normal.  She is on levothyroxine.  Normocytic anemia Hemoglobin is low but stable.  No overt bleeding noted.  Eliquis was resumed last night.    History of seizure disorder Continue Keppra and Lamictal.  No seizure activity here in the hospital.  Seems to be tolerating her oral medications.  Morbid obesity Estimated body mass index is 39.95 kg/m as calculated from the following:   Height as of this encounter: 5\' 5"  (1.651 m).   Weight as of this encounter: 108.9 kg.   DVT Prophylaxis: SCDs plus Eliquis.   Code Status: Full code   Medications:  Scheduled:  apixaban  5 mg Oral BID   Chlorhexidine Gluconate Cloth  6 each Topical Daily   darbepoetin (ARANESP) injection - NON-DIALYSIS  100 mcg Subcutaneous Q Sun-1800   diltiazem  180 mg Oral Daily   ezetimibe  10 mg Oral Daily   FLUoxetine  10 mg Oral Daily   lamoTRIgine  75 mg Oral BID   levETIRAcetam  1,500 mg Oral BID   levothyroxine  137 mcg Oral Q0600   mouth rinse  15 mL Mouth Rinse BID   metoCLOPramide (REGLAN) injection  5 mg Intravenous Q8H   pantoprazole  40 mg Oral BID   polyethylene glycol  17 g Oral Daily   rosuvastatin  40 mg Oral Daily    senna-docusate  1 tablet Oral BID   sodium chloride flush  10-40 mL Intracatheter Q12H   Continuous:  sodium bicarbonate 150 mEq in dextrose 5% 1000 mL 150 mEq (08/22/19 0204)   LOV:FIEPPIRJJOACZYS **OR** diphenhydrAMINE, HYDROmorphone (DILAUDID) injection, labetalol, ondansetron, oxyCODONE, phenol, sodium chloride flush, zolpidem   Objective:  Vital Signs  Vitals:   08/21/19 1322 08/21/19 2048 08/22/19 0511 08/22/19 1019  BP: 130/66 (!) 142/55 (!) 111/48 (!) 130/58  Pulse: 70 85 79 76  Resp:  16 16   Temp: 98 F (36.7 C) 99.1 F (37.3 C) 98.4 F (36.9 C)   TempSrc: Axillary Oral Oral   SpO2: 98% 96% 96%   Weight:      Height:        Intake/Output Summary (Last 24 hours) at 08/22/2019 1055 Last data filed at 08/22/2019 0956 Gross per 24 hour  Intake 3667.48 ml  Output 1800 ml  Net 1867.48 ml   Filed Weights   08/12/19 1041 08/12/19 2224  Weight: 106.1 kg 108.9 kg   General appearance: Awake alert.  In  no distress Resp: Diminished air entry at the bases without any definite crackles wheezing or rhonchi.  Normal effort at rest.   Cardio: S1-S2 is normal regular.  No S3-S4.  No rubs murmurs or bruit GI: Abdomen mildly distended.  Soft.  Mildly tender without any rebound rigidity or guarding.  Bowel sounds sluggish but present.   Neurologic:  No focal neurological deficits.    Lab Results:  Data Reviewed: I have personally reviewed following labs and imaging studies  CBC: Recent Labs  Lab 08/16/19 0330 08/16/19 0330 08/17/19 0743 08/17/19 0743 08/18/19 0216 08/19/19 0459 08/20/19 0454 08/21/19 0512 08/22/19 0500  WBC 7.5   < > 7.1   < > 8.6 18.2* 14.3* 13.4* 9.5  NEUTROABS 5.3  --  4.9  --  5.6 13.5* 10.7*  --   --   HGB 8.2*   < > 8.5*   < > 9.2* 9.1* 7.8* 8.7* 7.9*  HCT 26.3*   < > 27.8*   < > 29.5* 29.3* 24.6* 26.4* 23.8*  MCV 87.1   < > 88.5   < > 87.3 87.2 85.7 84.1 82.1  PLT 272   < > 293   < > 321 287 302 270 338   < > = values in this interval  not displayed.    Basic Metabolic Panel: Recent Labs  Lab 08/18/19 0216 08/19/19 0459 08/20/19 0454 08/21/19 0530 08/22/19 0507  NA 143 140 136 143 143  K 3.5 4.0 2.9* 2.9* 4.3  CL 113* 115* 103 105 103  CO2 20* 14* 23 27 28   GLUCOSE 112* 98 141* 109* 100*  BUN 11 14 19 20 22   CREATININE 2.87* 3.73* 4.89* 5.83* 6.39*  CALCIUM 8.3* 7.9* 7.4* 7.4* 7.5*  PHOS  --   --   --  2.7 2.6    GFR: Estimated Creatinine Clearance: 10.8 mL/min (A) (by C-G formula based on SCr of 6.39 mg/dL (H)).   CBG: Recent Labs  Lab 08/21/19 0001 08/21/19 0634 08/21/19 1613 08/22/19 0015 08/22/19 0513  GLUCAP 108* 117* 99 101* 95      Recent Results (from the past 240 hour(s))  Urine Culture     Status: None   Collection Time: 08/12/19 11:18 AM   Specimen: Urine, Clean Catch  Result Value Ref Range Status   Specimen Description   Final    URINE, CLEAN CATCH Performed at [facility], [address] [place], [place] [address]    Special Requests   Final    NONE Performed at [facility], [address] [place], [place] [address]    Culture   Final    NO GROWTH Performed at [facility], [place] [address], [place], [place] [address]    Report Status 08/13/2019 FINAL  Final  Blood Culture (routine x 2)     Status: None   Collection Time: 08/12/19 11:32 AM   Specimen: BLOOD LEFT ARM  Result Value Ref Range Status   Specimen Description BLOOD LEFT ARM  Final   Special Requests   Final    BOTTLES DRAWN AEROBIC AND ANAEROBIC Blood Culture results may not be optimal due to an excessive volume of blood received in culture bottles   Culture   Final    NO GROWTH 5 DAYS Performed at [facility], [address], [place], [place] [address]    Report Status 08/17/2019 FINAL  Final  SARS Coronavirus 2 by RT PCR (hospital order, performed in [facility] hospital lab) Nasopharyngeal Nasopharyngeal  Swab     Status: None   Collection Time: 08/12/19  1:41 PM   Specimen: Nasopharyngeal Swab    Result Value Ref Range Status   SARS Coronavirus 2 NEGATIVE NEGATIVE Final    Comment: (NOTE) SARS-CoV-2 target nucleic acids are NOT DETECTED.  The SARS-CoV-2 RNA is generally detectable in upper and lower respiratory specimens during the acute phase of infection. The lowest concentration of SARS-CoV-2 viral copies this assay can detect is 250 copies / mL. A negative result does not preclude SARS-CoV-2 infection and should not be used as the sole basis for treatment or other patient management decisions.  A negative result may occur with improper specimen collection / handling, submission of specimen other than nasopharyngeal swab, presence of viral mutation(s) within the areas targeted by this assay, and inadequate number of viral copies (<250 copies / mL). A negative result must be combined with clinical observations, patient history, and epidemiological information.  Fact Sheet for Patients:   StrictlyIdeas.no  Fact Sheet for Healthcare Providers: BankingDealers.co.za  This test is not yet approved or  cleared by the [place] FDA and has been authorized for detection and/or diagnosis of SARS-CoV-2 by FDA under an Emergency Use Authorization (EUA).  This EUA will remain in effect (meaning this test can be used) for the duration of the COVID-19 declaration under Section 564(b)(1) of the Act, 21 U.S.C. section 360bbb-3(b)(1), unless the authorization is terminated or revoked sooner.  Performed at [facility], [address], [place], [place] [address]   Surgical PCR screen     Status: None   Collection Time: 08/12/19  7:05 PM   Specimen: Nasal Mucosa; Nasal Swab  Result Value Ref Range Status   MRSA, PCR NEGATIVE NEGATIVE Final   Staphylococcus aureus NEGATIVE NEGATIVE Final    Comment: (NOTE) The Xpert SA Assay (FDA approved for NASAL specimens in patients 3 years of age and older), is one component of a  comprehensive surveillance program. It is not intended to diagnose infection nor to guide or monitor treatment. Performed at [facility], [place] [address] [place], [place] [address]   Culture, Urine     Status: None   Collection Time: 08/19/19  9:40 AM   Specimen: Urine, Catheterized  Result Value Ref Range Status   Specimen Description   Final    URINE, CATHETERIZED Performed at [place] [address] [place], [place] [address]    Special Requests   Final    NONE Performed at [facility], [place] [address] [place], [place] [address]    Culture   Final    NO GROWTH Performed at [facility], [place] [address], [place],  [address]    Report Status 08/20/2019 FINAL  Final      Radiology Studies: US RENAL  Result Date: 08/21/2019 CLINICAL DATA:  Acute renal insufficiency. Had a partial right nephrectomy for a right renal mass on 08/05/2019. EXAM: RENAL / URINARY TRACT ULTRASOUND COMPLETE COMPARISON:  CT, 08/12/2019 FINDINGS: Right Kidney: Renal measurements: 10.5 x 5.3 x 6.8 cm = volume: 196.7 mL. Increased and heterogeneous parenchymal echogenicity. Irregular cystic area noted in the midpole measuring 2.1 x 1.4 x 2.0 cm, which is likely a postoperative collection. No other renal mass small or lesion. 8 mm echogenic lesion projects in the mid pole which could reflect a stone. Focus of collecting system air is also possible. No hydronephrosis. Left Kidney: Renal measurements: 11.5 x 6.6 x 6.7 cm = volume:  267.9 mL. Increased parenchymal echogenicity. No mass, stone or hydronephrosis. Bladder: Appears normal for degree of bladder distention. Other: None. IMPRESSION: 1. Bilateral increased renal parenchymal echogenicity consistent with medical renal disease. 2. No hydronephrosis. 3. 2.1 cm irregular cystic lesion in the midpole the right kidney that likely reflects postoperative fluid from the previous partial  nephrectomy. A small focus of increased echogenicity in the midpole the right kidney is most likely due to air, which was evident within the kidney on the prior CT. A stone is possible, but no stone was noted in the right kidney on the prior CT. Electronically Signed   By: Lajean Manes M.D.   On: 08/21/2019 10:08       LOS: 10 days   [facility] Pager on [email or web address]  08/22/2019, 10:55 AM

## 2019-08-22 NOTE — Progress Notes (Signed)
Physical Therapy Treatment Patient Details Name: [name] MRN: [id number] DOB: September 28, 1955 Today's Date: 08/22/2019    History of Present Illness 64 y.o. female with history of hypertension, seizure disorder, hyperlipidemia, chronic kidney disease stage IIIb, papillary thyroid carcinoma status post thyroidectomy, and and right renal mass status post laparoscopic partial nephrectomy on 08/05/2019 who returned to the emergency department on 08/12/2019 with 2 to 3 days of nausea and vomiting, was found to have an acute kidney injury and partial SBO secondary to incisional hernia, s/p emergent laparosopic hernia reduction / repair 08/12/19. Onset afib/RVR. POd 2.    PT Comments    Pt continues to have a mild-mod posterior lean in standing. She also had some difficulty moving the L LE while mobilizing on today. She remains at risk for falls. (Per chart, she had a fall a couple of days ago). Pt demonstrates poor safety awareness. Multimodal cueing required during session. Recommend SNF at this time. Will continue to follow.     Follow Up Recommendations  SNF     Equipment Recommendations  3in1 (PT)    Recommendations for Other Services       Precautions / Restrictions Precautions Precautions: Fall Precaution Comments: can be a bit impulsive and unsafe    Mobility  Bed Mobility Overal bed mobility: Needs Assistance Bed Mobility: Supine to Sit     Supine to sit: Min guard;HOB elevated     General bed mobility comments: Increased time and use of bed rails to transfer to side of the bed. Cues for safety  Transfers Overall transfer level: Needs assistance Equipment used: Rolling walker (2 wheeled) Transfers: Sit to/from Stand Mod assist         General transfer comment: x 3 for safety, strengthening, technique. VCs safety, technique, hand/LE placement, proper use of RW. Mild-Mod posterior lean.  Ambulation/Gait Min assist; +2 for safety/equiment Gait Distance (Feet): 6  Feet Assistive device: Rolling walker (2 wheeled) Gait Pattern/deviations: Step-to pattern     General Gait Details: Assist to stabillize pt and manage RW. Unsteady/remains at risk for further falls. Cues for safety, technique, RW proximity   Stairs             Wheelchair Mobility    Modified Rankin (Stroke Patients Only)       Balance Overall balance assessment: Needs assistance         Standing balance support: Bilateral upper extremity supported Standing balance-Leahy Scale: Poor                              Cognition Arousal/Alertness: Awake/alert Behavior During Therapy: WFL for tasks assessed/performed;Impulsive Overall Cognitive Status: No family/caregiver present to determine baseline cognitive functioning Area of Impairment: Safety/judgement;Problem solving                         Safety/Judgement: Decreased awareness of safety;Decreased awareness of deficits   Problem Solving: Requires verbal cues        Exercises      General Comments        Pertinent Vitals/Pain Pain Assessment: Faces Faces Pain Scale: No hurt    Home Living                      Prior Function            PT Goals (current goals can now be found in the care plan section) Progress towards PT goals:  Progressing toward goals    Frequency    Min 2X/week      PT Plan Current plan remains appropriate    Co-evaluation              AM-PAC PT "6 Clicks" Mobility   Outcome Measure  Help needed turning from your back to your side while in a flat bed without using bedrails?: A Little Help needed moving from lying on your back to sitting on the side of a flat bed without using bedrails?: A Little Help needed moving to and from a bed to a chair (including a wheelchair)?: A Lot Help needed standing up from a chair using your arms (e.g., wheelchair or bedside chair)?: A Lot Help needed to walk in hospital room?: A Lot Help needed  climbing 3-5 steps with a railing? : Total 6 Click Score: 13    End of Session Equipment Utilized During Treatment: Gait belt Activity Tolerance: Patient tolerated treatment well Patient left: in chair;with call bell/phone within reach;with chair alarm set   PT Visit Diagnosis: Difficulty in walking, not elsewhere classified (R26.2);History of falling (Z91.81);Unsteadiness on feet (R26.81)     Time: 3735-7897 PT Time Calculation (min) (ACUTE ONLY): 23 min  Charges:  $Gait Training: 23-37 mins                        Doreatha Massed, PT Acute Rehabilitation  Office: [phone] Pager: [phone]

## 2019-08-22 NOTE — TOC Progression Note (Signed)
Transition of Care [facility]) - Progression Note    Patient Details  Name: [name] MRN: [id number] Date of Birth: [birth year]  [facility]) CM/SW Contact  Joaquin Courts, RN Phone Number: 08/22/2019, 3:00 PM  Clinical Narrative:   CM spoke with patient at bedside and presented bed offers.  At this time no offers from [facility] facilities.  Patient states she will review offers with her daughter prior to making a decision. CM will follow-up for facility choice.     Expected Discharge Plan: Home/Self Care Barriers to Discharge: Continued Medical Work up  Expected Discharge Plan and Services Expected Discharge Plan: Home/Self Care       Living arrangements for the past 2 months: Apartment                                       Social Determinants of Health (SDOH) Interventions    Readmission Risk Interventions No flowsheet data found.

## 2019-08-22 NOTE — Progress Notes (Signed)
10 Days Post-Op   Subjective/Chief Complaint:     1 - Small Bowel Obstruction / Incisional Hernia - s/p emergent laparosopic hernia reduction / repair 08/12/19, day of admission, for high grade SBO. NPO with NGT post-op. Regaln started 6/21. Resumed bowel function 6/22 and clears started. Advanced to reg diet 6/23.   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 Ipsilateral cyst decortication benign.   3 - Acute on Chronic Renal Failure - Cr 6's with poor PO intake and ACEI on admit 6/18. CT no hydro / fluid collections. Baseline Cr 1.6's.  Some fluctuations this admission, nephrology kindly following. Renal US this admission w/o hydro or large urinomas.   4 - Disposition / Rehab - independent at baseline, daughter [name] helps with errands. Deconditioned with several back to back hospital stays. SNF placement pending.   Today "Fani" is stable. Continuing to tolerate PO w/o emesis, BM yesterday. Cr continuing to rise. Renal US favorable, no hydro, urinomas.   Objective: Vital signs in last 24 hours: Temp:  [98.4 F (36.9 C)-99.1 F (37.3 C)] 98.7 F (37.1 C) (06/28 1227) Pulse Rate:  [76-92] 92 (06/28 1227) Resp:  [16] 16 (06/28 0511) BP: (111-142)/(48-62) 133/62 (06/28 1227) SpO2:  [96 %] 96 % (06/28 1227) Last BM Date: (S)  (No stools have been charted)  Intake/Output from previous day: 06/27 0701 - 06/28 0700 In: 3387.5 [P.O.:120; I.V.:3267.5] Out: 1800 [Urine:1800] Intake/Output this shift: Total I/O In: 380 [P.O.:380] Out: 800 [Urine:800]   General appearance: alert, cooperative  Eyes: negative Nose: Nares normal. Septum midline. Mucosa normal. No drainage or sinus tenderness. Throat: lips, mucosa, and tongue normal; teeth and gums normal Neck: thyroid not enlarged, symmetric, no tenderness/mass/nodules and old Rt neck line dressing c/d/i, no hematomas.  Back: symmetric, no  curvature. ROM normal. No CVA tenderness. Resp: non-labored on [place] O2.  Cardio: sinus with HR mid 80s by monitor.  GI: stable obesity and decreased distension. Recent port sites c/d/i.  Pelvic: external genitalia normal with purewick in place.  Extremities: extremities normal, atraumatic, no cyanosis or edema Lymph nodes: Cervical, supraclavicular, and axillary nodes normal. Neurologic: Grossly normal  Incision/Wound: as per above.   Lab Results:  Recent Labs    08/21/19 0512 08/22/19 0500  WBC 13.4* 9.5  HGB 8.7* 7.9*  HCT 26.4* 23.8*  PLT 270 338   BMET Recent Labs    08/21/19 0530 08/22/19 0507  NA 143 143  K 2.9* 4.3  CL 105 103  CO2 27 28  GLUCOSE 109* 100*  BUN 20 22  CREATININE 5.83* 6.39*  CALCIUM 7.4* 7.5*   PT/INR No results for input(s): LABPROT, INR in the last 72 hours. ABG No results for input(s): PHART, HCO3 in the last 72 hours.  Invalid input(s): PCO2, PO2  Studies/Results: US RENAL  Result Date: 08/21/2019 CLINICAL DATA:  Acute renal insufficiency. Had a partial right nephrectomy for a right renal mass on 08/05/2019. EXAM: RENAL / URINARY TRACT ULTRASOUND COMPLETE COMPARISON:  CT, 08/12/2019 FINDINGS: Right Kidney: Renal measurements: 10.5 x 5.3 x 6.8 cm = volume: 196.7 mL. Increased and heterogeneous parenchymal echogenicity. Irregular cystic area noted in the midpole measuring 2.1 x 1.4 x 2.0 cm, which is likely a postoperative collection. No other renal mass small or lesion. 8 mm echogenic lesion projects in the mid pole which could reflect a stone. Focus of collecting system air is also possible. No hydronephrosis.  Left Kidney: Renal measurements: 11.5 x 6.6 x 6.7 cm = volume: 267.9 mL. Increased parenchymal echogenicity. No mass, stone or hydronephrosis. Bladder: Appears normal for degree of bladder distention. Other: None. IMPRESSION: 1. Bilateral increased renal parenchymal echogenicity consistent with medical renal disease. 2. No hydronephrosis. 3.  2.1 cm irregular cystic lesion in the midpole the right kidney that likely reflects postoperative fluid from the previous partial nephrectomy. A small focus of increased echogenicity in the midpole the right kidney is most likely due to air, which was evident within the kidney on the prior CT. A stone is possible, but no stone was noted in the right kidney on the prior CT. Electronically Signed   By: Lajean Manes M.D.   On: 08/21/2019 10:08    Anti-infectives: Anti-infectives (From admission, onward)   Start     Dose/Rate Route Frequency Ordered Stop   08/12/19 2300  cefTRIAXone (ROCEPHIN) 2 g in sodium chloride 0.9 % 100 mL IVPB  Status:  Discontinued        2 g 200 mL/hr over 30 Minutes Intravenous Daily at bedtime 08/12/19 2226 08/21/19 1112   08/12/19 1145  cefTRIAXone (ROCEPHIN) 1 g in sodium chloride 0.9 % 100 mL IVPB        1 g 200 mL/hr over 30 Minutes Intravenous  Once 08/12/19 1140 08/12/19 1320      Assessment/Plan:  1 - Small Bowel Obstruction / Incisional Hernia - doing satisfactory. NO objective signs of recurrent obstruction. Continue reg diet.   2 - Right Renal Cancer  - good prognosis. NO further cancer directed therapy this admission.   3 - Acute on Chronic Renal Failure - likely severe pre-renal. Improved some with hydration. Appreciate nephrol co-managent. Will consider transfusion and advanced imaging if does not improve with conservative measures.   4 - Disposition / Rehab - FL2 for SNF preferably in [place] submitted.   [name] 08/22/2019

## 2019-08-22 NOTE — Plan of Care (Signed)
  Problem: Education: Goal: Knowledge of General Education information will improve Description: Including pain rating scale, medication(s)/side effects and non-pharmacologic comfort measures Outcome: Progressing   Problem: Clinical Measurements: Goal: Ability to maintain clinical measurements within normal limits will improve Outcome: Progressing Goal: Will remain free from infection Outcome: Progressing Goal: Diagnostic test results will improve Outcome: Progressing Goal: Respiratory complications will improve Outcome: Progressing Goal: Cardiovascular complication will be avoided Outcome: Progressing   Problem: Nutrition: Goal: Adequate nutrition will be maintained Outcome: Progressing   Problem: Coping: Goal: Level of anxiety will decrease Outcome: Progressing   Problem: Elimination: Goal: Will not experience complications related to bowel motility Outcome: Progressing   Problem: Pain Managment: Goal: General experience of comfort will improve Outcome: Progressing   Problem: Safety: Goal: Ability to remain free from injury will improve Outcome: Progressing   Problem: Skin Integrity: Goal: Risk for impaired skin integrity will decrease Outcome: Progressing   

## 2019-08-22 NOTE — Progress Notes (Signed)
Subjective:  Creat up 6.39, UOP 1000 , 2.8 L in. No N/V , good appetite.    Objective Vital signs in last 24 hours: Vitals:   08/21/19 1322 08/21/19 2048 08/22/19 0511 08/22/19 1019  BP: 130/66 (!) 142/55 (!) 111/48 (!) 130/58  Pulse: 70 85 79 76  Resp:  16 16   Temp: 98 F (36.7 C) 99.1 F (37.3 C) 98.4 F (36.9 C)   TempSrc: Axillary Oral Oral   SpO2: 98% 96% 96%   Weight:      Height:       Weight change:   Intake/Output Summary (Last 24 hours) at 08/22/2019 1228 Last data filed at 08/22/2019 7209 Gross per 24 hour  Intake 3667.48 ml  Output 1000 ml  Net 2667.48 ml    Summary: Pt is a 64 y.o. yo female with HTN, obesity, sz d/o  who was admitted on 08/12/2019 with lap robotic assisted partial nephrect c/b re operation for hernia/SBO c/b AKI  Assessment/Plan: 1. AoCKD 3b - b/l creat 1.4- 1.8. Here for R partial nephrectomy for renal mass done on 6/11 w/ about 50% of R kidney removed. Then had AKI felt to be hemodynamic > creat peaked at 5.6 on 6/19, then improved to 2.8 on 6/24. Now has AKI again. Nonoliguric initially w/ soft BP's and UTI. Lowered diltiazem dose. UA checked not remarkable. Renal US 6/27 showed no hydro bilat.  Creat up to 5.8 yest and 6.3 today. Will place foley to be sure there is no retention. No uremic signs, no indication for RRT yet. Will follow.  2. Metabolic acidosis-  giving some IV bicarb d#4 today, at 75 cc/hr.  Mild edema dependent area only. Ok to continue fluids.  3. Anemia- fairly had been stable- 8-9-   Down further but a little variable -  Give ESA dose 4. HTN/volume- soft BP- have dec'd cardizem, BP's normal now 5. Hypokalemia - resolved now  Kelly Splinter, MD 08/22/2019, 12:43 PM      Labs: Basic Metabolic Panel: Recent Labs  Lab 08/20/19 0454 08/21/19 0530 08/22/19 0507  NA 136 143 143  K 2.9* 2.9* 4.3  CL 103 105 103  CO2 23 27 28   GLUCOSE 141* 109* 100*  BUN 19 20 22   CREATININE 4.89* 5.83* 6.39*  CALCIUM 7.4* 7.4* 7.5*   PHOS  --  2.7 2.6   Liver Function Tests: Recent Labs  Lab 08/21/19 0530 08/22/19 0507  ALBUMIN 2.0* 1.7*   No results for input(s): LIPASE, AMYLASE in the last 168 hours. No results for input(s): AMMONIA in the last 168 hours. CBC: Recent Labs  Lab 08/18/19 0216 08/18/19 0216 08/19/19 0459 08/19/19 0459 08/20/19 0454 08/21/19 0512 08/22/19 0500  WBC 8.6   < > 18.2*   < > 14.3* 13.4* 9.5  NEUTROABS 5.6  --  13.5*  --  10.7*  --   --   HGB 9.2*   < > 9.1*   < > 7.8* 8.7* 7.9*  HCT 29.5*   < > 29.3*   < > 24.6* 26.4* 23.8*  MCV 87.3  --  87.2  --  85.7 84.1 82.1  PLT 321   < > 287   < > 302 270 338   < > = values in this interval not displayed.   Cardiac Enzymes: No results for input(s): CKTOTAL, CKMB, CKMBINDEX, TROPONINI in the last 168 hours. CBG: Recent Labs  Lab 08/21/19 0634 08/21/19 1613 08/22/19 0015 08/22/19 0513 08/22/19 1225  GLUCAP 117* 99  101* 95 124*    Iron Studies: No results for input(s): IRON, TIBC, TRANSFERRIN, FERRITIN in the last 72 hours. Studies/Results: US RENAL  Result Date: 08/21/2019 CLINICAL DATA:  Acute renal insufficiency. Had a partial right nephrectomy for a right renal mass on 08/05/2019. EXAM: RENAL / URINARY TRACT ULTRASOUND COMPLETE COMPARISON:  CT, 08/12/2019 FINDINGS: Right Kidney: Renal measurements: 10.5 x 5.3 x 6.8 cm = volume: 196.7 mL. Increased and heterogeneous parenchymal echogenicity. Irregular cystic area noted in the midpole measuring 2.1 x 1.4 x 2.0 cm, which is likely a postoperative collection. No other renal mass small or lesion. 8 mm echogenic lesion projects in the mid pole which could reflect a stone. Focus of collecting system air is also possible. No hydronephrosis. Left Kidney: Renal measurements: 11.5 x 6.6 x 6.7 cm = volume: 267.9 mL. Increased parenchymal echogenicity. No mass, stone or hydronephrosis. Bladder: Appears normal for degree of bladder distention. Other: None. IMPRESSION: 1. Bilateral increased  renal parenchymal echogenicity consistent with medical renal disease. 2. No hydronephrosis. 3. 2.1 cm irregular cystic lesion in the midpole the right kidney that likely reflects postoperative fluid from the previous partial nephrectomy. A small focus of increased echogenicity in the midpole the right kidney is most likely due to air, which was evident within the kidney on the prior CT. A stone is possible, but no stone was noted in the right kidney on the prior CT. Electronically Signed   By: Lajean Manes M.D.   On: 08/21/2019 10:08   Medications: Infusions: . sodium bicarbonate 150 mEq in dextrose 5% 1000 mL 150 mEq (08/22/19 0204)    Scheduled Medications: . apixaban  5 mg Oral BID  . Chlorhexidine Gluconate Cloth  6 each Topical Daily  . darbepoetin (ARANESP) injection - NON-DIALYSIS  100 mcg Subcutaneous Q Sun-1800  . diltiazem  180 mg Oral Daily  . ezetimibe  10 mg Oral Daily  . FLUoxetine  10 mg Oral Daily  . lamoTRIgine  75 mg Oral BID  . levETIRAcetam  1,500 mg Oral BID  . levothyroxine  137 mcg Oral Q0600  . mouth rinse  15 mL Mouth Rinse BID  . metoCLOPramide (REGLAN) injection  5 mg Intravenous Q8H  . pantoprazole  40 mg Oral BID  . polyethylene glycol  17 g Oral Daily  . rosuvastatin  40 mg Oral Daily  . senna-docusate  1 tablet Oral BID  . sodium chloride flush  10-40 mL Intracatheter Q12H    have reviewed scheduled and prn medications.  Physical Exam: General: alert, NAD Heart: RRR Lungs: mostly clear Abdomen: distended- some tenderness on right-  Incisions clean Extremities: min edema-  purewick-  More urine and lighter

## 2019-08-23 ENCOUNTER — Inpatient Hospital Stay (HOSPITAL_COMMUNITY): Payer: Medicare Other

## 2019-08-23 DIAGNOSIS — I10 Essential (primary) hypertension: Secondary | ICD-10-CM | POA: Diagnosis not present

## 2019-08-23 DIAGNOSIS — I480 Paroxysmal atrial fibrillation: Secondary | ICD-10-CM | POA: Diagnosis not present

## 2019-08-23 LAB — PREPARE RBC (CROSSMATCH)

## 2019-08-23 LAB — CBC
HCT: 23.1 % — ABNORMAL LOW (ref 36.0–46.0)
Hemoglobin: 7.6 g/dL — ABNORMAL LOW (ref 12.0–15.0)
MCH: 27.3 pg (ref 26.0–34.0)
MCHC: 32.9 g/dL (ref 30.0–36.0)
MCV: 83.1 fL (ref 80.0–100.0)
Platelets: 340 10*3/uL (ref 150–400)
RBC: 2.78 MIL/uL — ABNORMAL LOW (ref 3.87–5.11)
RDW: 15.6 % — ABNORMAL HIGH (ref 11.5–15.5)
WBC: 8.8 10*3/uL (ref 4.0–10.5)
nRBC: 0.2 % (ref 0.0–0.2)

## 2019-08-23 LAB — GLUCOSE, CAPILLARY
Glucose-Capillary: 91 mg/dL (ref 70–99)
Glucose-Capillary: 91 mg/dL (ref 70–99)
Glucose-Capillary: 92 mg/dL (ref 70–99)
Glucose-Capillary: 93 mg/dL (ref 70–99)

## 2019-08-23 LAB — RENAL FUNCTION PANEL
Albumin: 1.8 g/dL — ABNORMAL LOW (ref 3.5–5.0)
Anion gap: 13 (ref 5–15)
BUN: 22 mg/dL (ref 8–23)
CO2: 29 mmol/L (ref 22–32)
Calcium: 7.5 mg/dL — ABNORMAL LOW (ref 8.9–10.3)
Chloride: 98 mmol/L (ref 98–111)
Creatinine, Ser: 6.56 mg/dL — ABNORMAL HIGH (ref 0.44–1.00)
GFR calc Af Amer: 7 mL/min — ABNORMAL LOW (ref >60)
GFR calc non Af Amer: 6 mL/min — ABNORMAL LOW (ref >60)
Glucose, Bld: 101 mg/dL — ABNORMAL HIGH (ref 70–99)
Phosphorus: 2.8 mg/dL (ref 2.5–4.6)
Potassium: 3.2 mmol/L — ABNORMAL LOW (ref 3.5–5.1)
Sodium: 140 mmol/L (ref 135–145)

## 2019-08-23 MED ORDER — IOHEXOL 9 MG/ML PO SOLN
500.0000 mL | ORAL | Status: AC
  Administered 2019-08-23 (×2): 500 mL via ORAL

## 2019-08-23 MED ORDER — SODIUM CHLORIDE 0.9% IV SOLUTION: Freq: Once | INTRAVENOUS | Status: AC

## 2019-08-23 MED ORDER — IOHEXOL 9 MG/ML PO SOLN
ORAL | Status: AC
  Filled 2019-08-23: qty 1000

## 2019-08-23 MED ORDER — SODIUM CHLORIDE 0.45 % IV SOLN: INTRAVENOUS | Status: DC

## 2019-08-23 NOTE — TOC Progression Note (Signed)
Transition of Care [facility]) - Progression Note    Patient Details  Name: [name] MRN: [id number] Date of Birth: 30-Oct-1955  [facility] CM/SW Contact  Joaquin Courts, RN Phone Number: 08/23/2019, 1:07 PM  Clinical Narrative:  CM spoke with patient and daughter and updated on a bed offer from facility in [place].  Patient and daughter select [facility].  Facility rep notified of choice.  Patient will need an updated Covid test prior to dc to SNF.         Expected Discharge Plan: Home/Self Care Barriers to Discharge: Continued Medical Work up  Expected Discharge Plan and Services Expected Discharge Plan: Home/Self Care       Living arrangements for the past 2 months: Apartment                                       Social Determinants of Health (SDOH) Interventions    Readmission Risk Interventions No flowsheet data found.

## 2019-08-23 NOTE — Progress Notes (Signed)
11 Days Post-Op   Subjective/Chief Complaint:    1 - Small Bowel Obstruction / Incisional Hernia - s/p emergent laparosopic hernia reduction / repair 08/12/19, day of admission, for high grade SBO. NPO with NGT post-op. Regaln started 6/21. Resumed bowel function 6/22 and clears started. Advanced to reg diet 6/23.   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 Ipsilateral cyst decortication benign.   3 - Acute on Chronic Renal Failure - Cr 6's with poor PO intake and ACEI on admit 6/18. CT no hydro / fluid collections. Baseline Cr 1.6's.  Some fluctuations this admission, nephrology kindly following. Renal US this admission w/o hydro or large urinomas.   4 - Symptomatic Anemia - slow Hgb post-op now stabilized high 7s. GFR declien as per above.   5 - Disposition / Rehab - independent at baseline, daughter [name] helps with errands. Deconditioned with several back to back hospital stays. SNF placement pending.   Today "[name]" is stable, but making minimal forward progress. GFR remains very poor. Hgb high 7s. Tollerating PO and no emesis.     Objective: Vital signs in last 24 hours: Temp:  [98.2 F (36.8 C)-98.7 F (37.1 C)] 98.2 F (36.8 C) (06/29 0520) Pulse Rate:  [76-92] 78 (06/29 0520) Resp:  [18] 18 (06/29 0520) BP: (130-143)/(58-67) 135/64 (06/29 0520) SpO2:  [94 %-97 %] 94 % (06/29 0520) Weight:  [326.7 kg] 118.2 kg (06/29 0516) Last BM Date: (S)  (No stools have been charted)  Intake/Output from previous day: 06/28 0701 - 06/29 0700 In: 1944.6 [P.O.:380; I.V.:1564.6] Out: 2200 [Urine:2200] Intake/Output this shift: No intake/output data recorded.   General appearance: alert, cooperative  Eyes: negative Nose: Nares normal. Septum midline. Mucosa normal. No drainage or sinus tenderness. Throat: lips, mucosa, and tongue normal; teeth and gums normal Neck: thyroid not enlarged,  symmetric, no tenderness/mass/nodules and old Rt neck line dressing c/d/i, no hematomas.  Back: symmetric, no curvature. ROM normal. No CVA tenderness. Resp: non-labored on [place] O2.  Cardio: sinus with HR mid 80s by monitor.  GI: stable obesity and decreased distension. Recent port sites c/d/i.  Pelvic: external genitalia normal with purewick in place.  Extremities: extremities normal, atraumatic, no cyanosis or edema. RUE IV infiltrated. STopped and discussed with NSG>  Lymph nodes: Cervical, supraclavicular, and axillary nodes normal. Neurologic: Grossly normal  Incision/Wound: as per above.   Lab Results:  Recent Labs    08/22/19 0500 08/23/19 0433  WBC 9.5 8.8  HGB 7.9* 7.6*  HCT 23.8* 23.1*  PLT 338 340   BMET Recent Labs    08/22/19 0507 08/23/19 0433  NA 143 140  K 4.3 3.2*  CL 103 98  CO2 28 29  GLUCOSE 100* 101*  BUN 22 22  CREATININE 6.39* 6.56*  CALCIUM 7.5* 7.5*   PT/INR No results for input(s): LABPROT, INR in the last 72 hours. ABG No results for input(s): PHART, HCO3 in the last 72 hours.  Invalid input(s): PCO2, PO2  Studies/Results: US RENAL  Result Date: 08/21/2019 CLINICAL DATA:  Acute renal insufficiency. Had a partial right nephrectomy for a right renal mass on 08/05/2019. EXAM: RENAL / URINARY TRACT ULTRASOUND COMPLETE COMPARISON:  CT, 08/12/2019 FINDINGS: Right Kidney: Renal measurements: 10.5 x 5.3 x 6.8 cm = volume: 196.7 mL. Increased and heterogeneous parenchymal echogenicity. Irregular cystic area noted in the midpole measuring 2.1 x 1.4 x 2.0 cm, which is likely a  postoperative collection. No other renal mass small or lesion. 8 mm echogenic lesion projects in the mid pole which could reflect a stone. Focus of collecting system air is also possible. No hydronephrosis. Left Kidney: Renal measurements: 11.5 x 6.6 x 6.7 cm = volume: 267.9 mL. Increased parenchymal echogenicity. No mass, stone or hydronephrosis. Bladder: Appears normal for degree of  bladder distention. Other: None. IMPRESSION: 1. Bilateral increased renal parenchymal echogenicity consistent with medical renal disease. 2. No hydronephrosis. 3. 2.1 cm irregular cystic lesion in the midpole the right kidney that likely reflects postoperative fluid from the previous partial nephrectomy. A small focus of increased echogenicity in the midpole the right kidney is most likely due to air, which was evident within the kidney on the prior CT. A stone is possible, but no stone was noted in the right kidney on the prior CT. Electronically Signed   By: Lajean Manes M.D.   On: 08/21/2019 10:08    Anti-infectives: Anti-infectives (From admission, onward)   Start     Dose/Rate Route Frequency Ordered Stop   08/12/19 2300  cefTRIAXone (ROCEPHIN) 2 g in sodium chloride 0.9 % 100 mL IVPB  Status:  Discontinued        2 g 200 mL/hr over 30 Minutes Intravenous Daily at bedtime 08/12/19 2226 08/21/19 1112   08/12/19 1145  cefTRIAXone (ROCEPHIN) 1 g in sodium chloride 0.9 % 100 mL IVPB        1 g 200 mL/hr over 30 Minutes Intravenous  Once 08/12/19 1140 08/12/19 1320      Assessment/Plan:  1 - Small Bowel Obstruction / Incisional Hernia - doing satisfactory. NO objective signs of recurrent obstruction. Continue reg diet.   2 - Right Renal Cancer  - good prognosis. NO further cancer directed therapy this admission.   3 - Acute on Chronic Renal Failure - likely severe pre-renal. Improved some with hydration. Appreciate nephrol co-managent.  CT today and transfusion.   4 - Symptomatic Anemia - rec transfuse 2 units today. K acceptable. Cautionsly optimistic this will improve GFR and energy level some.    5 - Disposition / Rehab - FL2 for SNF preferably in [place] submitted.   [name] 08/23/2019

## 2019-08-23 NOTE — Progress Notes (Signed)
[facility] PROGRESS NOTE   [name] UXN:[id number] DOB: 10-Nov-1955 DOA: 08/12/2019  PCP: [name], MD  Brief History/Interval Summary: 64 y.o. female with history of hypertension, seizure disorder, hyperlipidemia, chronic kidney disease stage IIIb, papillary thyroid carcinoma status post thyroidectomy, and and right renal mass status post laparoscopic partial nephrectomy on 08/05/2019 who returned to the emergency department on 08/12/2019 with 2 to 3 days of nausea and vomiting, was found to have an acute kidney injury and partial SBO secondary to incisional hernia.  Patient was admitted to the urology service and taken to the operating room that same day for port site incisional hernia repair.  On the evening of 6/28 patient was found to have heart rate in the 1 30-1 40 range.  EKG showed atrial fibrillation with RVR.  Patient was transferred to the ICU.  Hospitalist was consulted.   Procedures:  Transthoracic echocardiogram 6/21 1. Hyperdynamic LV systolic function; elevated mean gradient across  aortic valve (19 mmHg) suggests mild AS; however hyperdynamic LV function  at least partially contributing; trace AI; mild TR with severely elevated  pulmonary pressure.  2. Left ventricular ejection fraction, by estimation, is >75%. The left  ventricle has hyperdynamic function. The left ventricle has no regional  wall motion abnormalities. Left ventricular diastolic parameters were  normal.  3. Right ventricular systolic function is normal. The right ventricular  size is normal. There is severely elevated pulmonary artery systolic  pressure.  4. The mitral valve is normal in structure. Trivial mitral valve  regurgitation. No evidence of mitral stenosis.  5. The aortic valve is tricuspid. Aortic valve regurgitation is trivial.  Mild aortic valve stenosis.  6. The inferior vena cava is normal in size with greater than 50%  respiratory variability, suggesting  right atrial pressure of 3 mmHg.   Antibiotics: Anti-infectives (From admission, onward)   Start     Dose/Rate Route Frequency Ordered Stop   08/12/19 2300  cefTRIAXone (ROCEPHIN) 2 g in sodium chloride 0.9 % 100 mL IVPB  Status:  Discontinued        2 g 200 mL/hr over 30 Minutes Intravenous Daily at bedtime 08/12/19 2226 08/21/19 1112   08/12/19 1145  cefTRIAXone (ROCEPHIN) 1 g in sodium chloride 0.9 % 100 mL IVPB        1 g 200 mL/hr over 30 Minutes Intravenous  Once 08/12/19 1140 08/12/19 1320      Subjective/Interval History: Patient seems to be much more awake and alert today.  She is noted to be eating a banana.  Informed by the nursing staff that the CT scan was ordered by urology this morning.  Patient denies any nausea vomiting.  Has been passing gas from below but denies any bowel movements.  She now has a Foley catheter.      Assessment/Plan:  Paroxysmal atrial fibrillation Patient developed atrial fibrillation with RVR.  She was transferred to the ICU and placed on a Cardizem infusion.  She subsequently converted to sinus rhythm. TSH was normal.  Echocardiogram shows normal systolic function.  Significantly elevated pulmonary pressure is noted.   She also had some pleuritic chest pain initially.  D-dimer was elevated.  VQ scan is low probability for PE.  Doppler studies negative for DVT.  No further work-up at this time. Chads 2 vascular score is 3.  Oral anticoagulation is recommended.  Discussed with the patient and her daughter in detail.  They were agreeable.  Patient was subsequently started on Eliquis after discussing  with urology.  Patient remains in sinus rhythm.  Continue current dose of Cardizem.  Eliquis had to be held due to drop in hemoglobin and then subsequently resumed after discussing with urology.  Continue to monitor hemoglobin closely. She will need outpatient referral to cardiology and pulmonology in the next 1 to 2 months.  She lives in the [place] area.   This can be arranged by her PCP.    Acute kidney injury superimposed on chronic kidney disease stage IIIb/hypokalemia Baseline creatinine is around 1.5-1.7.  Presented with a creatinine of 5.89.  Nephrology was involved.  Patient's creatinine had improved but worsened starting 6/24.   Nephrology was reconsulted.  Patient was started on a bicarbonate infusion.  Creatinine has been climbing but her urine output has picked.  Hopefully her creatinine will plateau and then start improving.   Management per nephrology.  Potassium repletion per nephrology.  Small bowel obstruction secondary to incisional hernia status post repair on 08/12/2019 Management deferred to the primary team.  It appears that the plan is for repeating a CT scan of the abdomen and pelvis today.  Essential hypertension Her antihypertensives including ACE inhibitor and Norvasc were held.   Now only on Cardizem with which blood pressure seems to be adequately well controlled.  The dose had to be reduced a few days ago due to borderline low blood pressures. Pressure remains stable.  Hypothyroidism TSH was normal.  She is on levothyroxine.  Normocytic anemia Hemoglobin is low but stable.  No overt bleeding noted.  Remains on Eliquis.  History of seizure disorder Continue Keppra and Lamictal.  No seizure activity here in the hospital.  Seems to be tolerating her oral medications.  Morbid obesity Estimated body mass index is 43.36 kg/m as calculated from the following:   Height as of this encounter: 5\' 5"  (1.651 m).   Weight as of this encounter: 118.2 kg.   DVT Prophylaxis: SCDs plus Eliquis.   Code Status: Full code   Medications:  Scheduled: . sodium chloride   Intravenous Once  . apixaban  5 mg Oral BID  . Chlorhexidine Gluconate Cloth  6 each Topical Daily  . darbepoetin (ARANESP) injection - NON-DIALYSIS  100 mcg Subcutaneous Q Sun-1800  . diltiazem  180 mg Oral Daily  . ezetimibe  10 mg Oral Daily  .  FLUoxetine  10 mg Oral Daily  . iohexol      . lamoTRIgine  75 mg Oral BID  . levETIRAcetam  1,500 mg Oral BID  . levothyroxine  137 mcg Oral Q0600  . mouth rinse  15 mL Mouth Rinse BID  . metoCLOPramide (REGLAN) injection  5 mg Intravenous Q8H  . pantoprazole  40 mg Oral BID  . polyethylene glycol  17 g Oral Daily  . rosuvastatin  40 mg Oral Daily  . senna-docusate  1 tablet Oral BID  . sodium chloride flush  10-40 mL Intracatheter Q12H   Continuous: . sodium bicarbonate 150 mEq in dextrose 5% 1000 mL 150 mEq (08/23/19 0340)   QVZ:DGLOVFIEPPIRJJO **OR** diphenhydrAMINE, HYDROmorphone (DILAUDID) injection, labetalol, ondansetron, oxyCODONE, phenol, sodium chloride flush, zolpidem   Objective:  Vital Signs  Vitals:   08/22/19 1956 08/23/19 0500 08/23/19 0516 08/23/19 0520  BP: (!) 143/67   135/64  Pulse: 86   78  Resp: 18   18  Temp: 98.7 F (37.1 C)   98.2 F (36.8 C)  TempSrc: Oral   Oral  SpO2: 97%   94%  Weight:  118.2 kg 118.2  kg   Height:        Intake/Output Summary (Last 24 hours) at 08/23/2019 1011 Last data filed at 08/23/2019 0600 Gross per 24 hour  Intake 1443.1 ml  Output 2200 ml  Net -756.9 ml   Filed Weights   08/12/19 2224 08/23/19 0500 08/23/19 0516  Weight: 108.9 kg 118.2 kg 118.2 kg   General appearance: Awake alert.  In no distress Resp: Diminished air entry at the bases but otherwise clear to auscultation.  No wheezing or rhonchi.  Normal effort.   Cardio: S1-S2 is normal regular.  No S3-S4.  No rubs murmurs or bruit GI: Abdomen is soft.  Nontender nondistended.  Bowel sounds are present normal.  No masses organomegaly Neurologic: No obvious focal neurological deficits.   Lab Results:  Data Reviewed: I have personally reviewed following labs and imaging studies  CBC: Recent Labs  Lab 08/17/19 0743 08/17/19 0743 08/18/19 0216 08/18/19 0216 08/19/19 0459 08/20/19 0454 08/21/19 0512 08/22/19 0500 08/23/19 0433  WBC 7.1   < > 8.6    < > 18.2* 14.3* 13.4* 9.5 8.8  NEUTROABS 4.9  --  5.6  --  13.5* 10.7*  --   --   --   HGB 8.5*   < > 9.2*   < > 9.1* 7.8* 8.7* 7.9* 7.6*  HCT 27.8*   < > 29.5*   < > 29.3* 24.6* 26.4* 23.8* 23.1*  MCV 88.5   < > 87.3   < > 87.2 85.7 84.1 82.1 83.1  PLT 293   < > 321   < > 287 302 270 338 340   < > = values in this interval not displayed.    Basic Metabolic Panel: Recent Labs  Lab 08/19/19 0459 08/20/19 0454 08/21/19 0530 08/22/19 0507 08/23/19 0433  NA 140 136 143 143 140  K 4.0 2.9* 2.9* 4.3 3.2*  CL 115* 103 105 103 98  CO2 14* 23 27 28 29   GLUCOSE 98 141* 109* 100* 101*  BUN 14 19 20 22 22   CREATININE 3.73* 4.89* 5.83* 6.39* 6.56*  CALCIUM 7.9* 7.4* 7.4* 7.5* 7.5*  PHOS  --   --  2.7 2.6 2.8    GFR: Estimated Creatinine Clearance: 11 mL/min (A) (by C-G formula based on SCr of 6.56 mg/dL (H)).   CBG: Recent Labs  Lab 08/22/19 0015 08/22/19 0513 08/22/19 1225 08/23/19 0004 08/23/19 0549  GLUCAP 101* 95 124* 91 93      Recent Results (from the past 240 hour(s))  Culture, Urine     Status: None   Collection Time: 08/19/19  9:40 AM   Specimen: Urine, Catheterized  Result Value Ref Range Status   Specimen Description   Final    URINE, CATHETERIZED Performed at [facility], [place] [address], [place], [place] [address]    Special Requests   Final    NONE Performed at [facility], [place] [address], [place], [place] [address]    Culture   Final    NO GROWTH Performed at [facility], [place] [address], [place], [place] [address]    Report Status 08/20/2019 FINAL  Final      Radiology Studies: No results found.     LOS: 11 days   Akira Perusse [employer] on [email or web address]  08/23/2019, 10:11 AM

## 2019-08-23 NOTE — Progress Notes (Addendum)
Subjective:  Creat up slighty at 6.5 today (6.4 yest), UOP continues to improve > 1 L yesterday. Pt denies any N/V or confusion.    Objective Vital signs in last 24 hours: Vitals:   08/23/19 0516 08/23/19 0520 08/23/19 1102 08/23/19 1132  BP:  135/64 (!) 138/56 137/64  Pulse:  78 99 81  Resp:  18 18 18   Temp:  98.2 F (36.8 C) 98.4 F (36.9 C) 98.5 F (36.9 C)  TempSrc:  Oral Oral Oral  SpO2:  94% 100% 98%  Weight: 118.2 kg     Height:       Weight change:   Intake/Output Summary (Last 24 hours) at 08/23/2019 1145 Last data filed at 08/23/2019 1102 Gross per 24 hour  Intake 1943.1 ml  Output 2750 ml  Net -806.9 ml   Physical Exam: General: alert, NAD Heart: RRR Lungs: mostly clear Abdomen: distended- some tenderness on right-  Incisions clean Extremities: min edema, foley in place draining clear yellow urine   Summary: Pt is a 64 y.o. yo female with HTN, obesity, sz d/o  who was admitted on 08/12/2019 with lap robotic assisted partial nephrect c/b re operation for hernia/SBO c/b AKI  Assessment/Plan: 1. AoCKD 3b - b/l creat 1.4- 1.8. Here for R partial nephrectomy for renal mass done on 6/11 w/ about 50% of R kidney removed. Then had AKI felt to be hemodynamic > creat peaked at 5.6 on 6/19, then improved to 2.8 on 6/24. Now has AKI again. Nonoliguric initially w/ soft BP's and UTI. Lowered diltiazem dose. UA checked not remarkable. Renal US 6/27 showed no hydro bilat.  Creat up to 5.8 > 6.3 yest and 6.5 today. Foley in place. May been starting to plateau. No uremic signs yet. Cont IVF at 75 cc/hr, cont supportive care.  2. Metabolic acidosis-  Resolved, CO2 29 today, will change IVF to 1/2 NS 3. Anemia- fairly had been stable- 8-9-   Down further but a little variable -  Give ESA dose 4. HTN/volume- soft BP- have dec'd cardizem, BP's normal now 5. Hypokalemia - resolved now  Kelly Splinter, MD 08/23/2019, 11:45 AM      Labs: Basic Metabolic Panel: Recent Labs  Lab  08/21/19 0530 08/22/19 0507 08/23/19 0433  NA 143 143 140  K 2.9* 4.3 3.2*  CL 105 103 98  CO2 27 28 29   GLUCOSE 109* 100* 101*  BUN 20 22 22   CREATININE 5.83* 6.39* 6.56*  CALCIUM 7.4* 7.5* 7.5*  PHOS 2.7 2.6 2.8   Liver Function Tests: Recent Labs  Lab 08/21/19 0530 08/22/19 0507 08/23/19 0433  ALBUMIN 2.0* 1.7* 1.8*   No results for input(s): LIPASE, AMYLASE in the last 168 hours. No results for input(s): AMMONIA in the last 168 hours. CBC: Recent Labs  Lab 08/18/19 0216 08/18/19 0216 08/19/19 0459 08/19/19 0459 08/20/19 0454 08/20/19 0454 08/21/19 0512 08/22/19 0500 08/23/19 0433  WBC 8.6   < > 18.2*   < > 14.3*   < > 13.4* 9.5 8.8  NEUTROABS 5.6  --  13.5*  --  10.7*  --   --   --   --   HGB 9.2*   < > 9.1*   < > 7.8*   < > 8.7* 7.9* 7.6*  HCT 29.5*   < > 29.3*   < > 24.6*   < > 26.4* 23.8* 23.1*  MCV 87.3   < > 87.2  --  85.7  --  84.1 82.1 83.1  PLT  321   < > 287   < > 302   < > 270 338 340   < > = values in this interval not displayed.   Cardiac Enzymes: No results for input(s): CKTOTAL, CKMB, CKMBINDEX, TROPONINI in the last 168 hours. CBG: Recent Labs  Lab 08/22/19 0513 08/22/19 1225 08/23/19 0004 08/23/19 0549 08/23/19 1137  GLUCAP 95 124* 91 93 91    Iron Studies: No results for input(s): IRON, TIBC, TRANSFERRIN, FERRITIN in the last 72 hours.   Medications: Infusions: . sodium bicarbonate 150 mEq in dextrose 5% 1000 mL 150 mEq (08/23/19 0340)    Scheduled Medications: . apixaban  5 mg Oral BID  . Chlorhexidine Gluconate Cloth  6 each Topical Daily  . darbepoetin (ARANESP) injection - NON-DIALYSIS  100 mcg Subcutaneous Q Sun-1800  . diltiazem  180 mg Oral Daily  . ezetimibe  10 mg Oral Daily  . FLUoxetine  10 mg Oral Daily  . iohexol      . lamoTRIgine  75 mg Oral BID  . levETIRAcetam  1,500 mg Oral BID  . levothyroxine  137 mcg Oral Q0600  . mouth rinse  15 mL Mouth Rinse BID  . metoCLOPramide (REGLAN) injection  5 mg  Intravenous Q8H  . pantoprazole  40 mg Oral BID  . polyethylene glycol  17 g Oral Daily  . rosuvastatin  40 mg Oral Daily  . senna-docusate  1 tablet Oral BID  . sodium chloride flush  10-40 mL Intracatheter Q12H    have reviewed scheduled and prn medications.

## 2019-08-24 DIAGNOSIS — I4891 Unspecified atrial fibrillation: Secondary | ICD-10-CM | POA: Diagnosis not present

## 2019-08-24 DIAGNOSIS — K431 Incisional hernia with gangrene: Secondary | ICD-10-CM | POA: Diagnosis not present

## 2019-08-24 DIAGNOSIS — R569 Unspecified convulsions: Secondary | ICD-10-CM | POA: Diagnosis not present

## 2019-08-24 LAB — CBC
HCT: 31.1 % — ABNORMAL LOW (ref 36.0–46.0)
Hemoglobin: 10.1 g/dL — ABNORMAL LOW (ref 12.0–15.0)
MCH: 27.7 pg (ref 26.0–34.0)
MCHC: 32.5 g/dL (ref 30.0–36.0)
MCV: 85.2 fL (ref 80.0–100.0)
Platelets: 319 10*3/uL (ref 150–400)
RBC: 3.65 MIL/uL — ABNORMAL LOW (ref 3.87–5.11)
RDW: 15.7 % — ABNORMAL HIGH (ref 11.5–15.5)
WBC: 10.2 10*3/uL (ref 4.0–10.5)
nRBC: 0 % (ref 0.0–0.2)

## 2019-08-24 LAB — BPAM RBC
Blood Product Expiration Date: 202107032359
Blood Product Expiration Date: 202107272359
ISSUE DATE / TIME: 202106291110
ISSUE DATE / TIME: 202106291348
Unit Type and Rh: 5100
Unit Type and Rh: 9500

## 2019-08-24 LAB — TYPE AND SCREEN
ABO/RH(D): O POS
Antibody Screen: NEGATIVE
Unit division: 0
Unit division: 0

## 2019-08-24 LAB — RENAL FUNCTION PANEL
Albumin: 2 g/dL — ABNORMAL LOW (ref 3.5–5.0)
Anion gap: 11 (ref 5–15)
BUN: 24 mg/dL — ABNORMAL HIGH (ref 8–23)
CO2: 29 mmol/L (ref 22–32)
Calcium: 7.8 mg/dL — ABNORMAL LOW (ref 8.9–10.3)
Chloride: 99 mmol/L (ref 98–111)
Creatinine, Ser: 6.49 mg/dL — ABNORMAL HIGH (ref 0.44–1.00)
GFR calc Af Amer: 7 mL/min — ABNORMAL LOW (ref >60)
GFR calc non Af Amer: 6 mL/min — ABNORMAL LOW (ref >60)
Glucose, Bld: 91 mg/dL (ref 70–99)
Phosphorus: 3.3 mg/dL (ref 2.5–4.6)
Potassium: 3.4 mmol/L — ABNORMAL LOW (ref 3.5–5.1)
Sodium: 139 mmol/L (ref 135–145)

## 2019-08-24 LAB — IRON AND TIBC
Iron: 92 ug/dL (ref 28–170)
Saturation Ratios: 51 % — ABNORMAL HIGH (ref 10.4–31.8)
TIBC: 181 ug/dL — ABNORMAL LOW (ref 250–450)
UIBC: 89 ug/dL

## 2019-08-24 LAB — SARS CORONAVIRUS 2 BY RT PCR (HOSPITAL ORDER, PERFORMED IN ~~LOC~~ HOSPITAL LAB): SARS Coronavirus 2: NEGATIVE

## 2019-08-24 LAB — GLUCOSE, CAPILLARY
Glucose-Capillary: 75 mg/dL (ref 70–99)
Glucose-Capillary: 91 mg/dL (ref 70–99)

## 2019-08-24 LAB — FERRITIN: Ferritin: 476 ng/mL — ABNORMAL HIGH (ref 11–307)

## 2019-08-24 MED ORDER — METOCLOPRAMIDE HCL 10 MG/10ML PO SOLN
5.0000 mg | Freq: Three times a day (TID) | ORAL | Status: AC
  Administered 2019-08-25 (×2): 5 mg via ORAL
  Filled 2019-08-24 (×2): qty 10

## 2019-08-24 NOTE — Progress Notes (Signed)
Subjective:  Creat down at 6.4 today.  UOP 3.1 L yest. VSS, RR 18, no SOB or N/V.    Objective Vital signs in last 24 hours: Vitals:   08/23/19 2105 08/24/19 0500 08/24/19 0712 08/24/19 1220  BP: (!) 143/76  128/61 132/66  Pulse: 84  83 84  Resp: 20  20   Temp: 98.1 F (36.7 C)  98.1 F (36.7 C) 98.6 F (37 C)  TempSrc: Oral  Oral Oral  SpO2: 100%  97% 96%  Weight:  112.4 kg    Height:       Weight change: -5.8 kg  Intake/Output Summary (Last 24 hours) at 08/24/2019 1308 Last data filed at 08/24/2019 1000 Gross per 24 hour  Intake 813.5 ml  Output 1550 ml  Net -736.5 ml   Physical Exam: General: alert, NAD Heart: RRR Lungs: mostly clear Abdomen: distended- some tenderness on right-  Incisions clean Extremities: min edema, foley in place draining clear yellow urine   Summary: Pt is a 64 y.o. yo female with HTN, obesity, sz d/o  who was admitted on 08/12/2019 with lap robotic assisted partial nephrect c/b re operation for hernia/SBO c/b AKI  Assessment/Plan: 1. AoCKD 3b - b/l creat 1.4- 1.8. Here for R partial nephrectomy for renal mass done on 6/11 w/ about 50% of R kidney removed. Then had AKI felt to be hemodynamic > creat peaked at 5.6 on 6/19, then improved to 2.8 on 6/24. Then developed AKI again, nonoliguric initially w/ soft BP's and UTI. Lowered diltiazem dose. UA checked not remarkable. Renal US 6/27 showed no hydro bilat.  Creat was up to 6.3 then 6.5 yest but today is down slightly to 6.4.  Foley in place. May be starting to recover.  NO uremic s/s to indicate need for RRT.  Cont IVF at 65 cc/hr for now.  Will follow.  2. Metabolic acidosis-  Resolved, CO2 29 today, will change IVF to 1/2 NS 3. Anemia- fairly had been stable- 8-9-   Down further but a little variable -  Give ESA dose 4. HTN/volume-  Lowered cardizem for soft BP's which are now better, no other BP meds. BP's normal and euvolemic on exam w/ stable wt at 112kg.    Kelly Splinter, MD 08/24/2019, 1:08  PM      Labs: Basic Metabolic Panel: Recent Labs  Lab 08/22/19 0507 08/23/19 0433 08/24/19 0432  NA 143 140 139  K 4.3 3.2* 3.4*  CL 103 98 99  CO2 28 29 29   GLUCOSE 100* 101* 91  BUN 22 22 24*  CREATININE 6.39* 6.56* 6.49*  CALCIUM 7.5* 7.5* 7.8*  PHOS 2.6 2.8 3.3   Liver Function Tests: Recent Labs  Lab 08/22/19 0507 08/23/19 0433 08/24/19 0432  ALBUMIN 1.7* 1.8* 2.0*   No results for input(s): LIPASE, AMYLASE in the last 168 hours. No results for input(s): AMMONIA in the last 168 hours. CBC: Recent Labs  Lab 08/18/19 0216 08/18/19 0216 08/19/19 0459 08/19/19 0459 08/20/19 0454 08/20/19 0454 08/21/19 0512 08/21/19 0512 08/22/19 0500 08/23/19 0433 08/24/19 0432  WBC 8.6   < > 18.2*   < > 14.3*   < > 13.4*   < > 9.5 8.8 10.2  NEUTROABS 5.6  --  13.5*  --  10.7*  --   --   --   --   --   --   HGB 9.2*   < > 9.1*   < > 7.8*   < > 8.7*   < >  7.9* 7.6* 10.1*  HCT 29.5*   < > 29.3*   < > 24.6*   < > 26.4*   < > 23.8* 23.1* 31.1*  MCV 87.3   < > 87.2   < > 85.7  --  84.1  --  82.1 83.1 85.2  PLT 321   < > 287   < > 302   < > 270   < > 338 340 319   < > = values in this interval not displayed.   Cardiac Enzymes: No results for input(s): CKTOTAL, CKMB, CKMBINDEX, TROPONINI in the last 168 hours. CBG: Recent Labs  Lab 08/23/19 0549 08/23/19 1137 08/23/19 1646 08/24/19 0053 08/24/19 1210  GLUCAP 93 91 92 91 75    Iron Studies:  Recent Labs    08/24/19 0432  IRON 92  TIBC 181*  FERRITIN 476*     Medications: Infusions: . sodium chloride 75 mL/hr at 08/23/19 1642    Scheduled Medications: . apixaban  5 mg Oral BID  . Chlorhexidine Gluconate Cloth  6 each Topical Daily  . darbepoetin (ARANESP) injection - NON-DIALYSIS  100 mcg Subcutaneous Q Sun-1800  . diltiazem  180 mg Oral Daily  . ezetimibe  10 mg Oral Daily  . FLUoxetine  10 mg Oral Daily  . lamoTRIgine  75 mg Oral BID  . levETIRAcetam  1,500 mg Oral BID  . levothyroxine  137 mcg Oral  Q0600  . mouth rinse  15 mL Mouth Rinse BID  . metoCLOPramide (REGLAN) injection  5 mg Intravenous Q8H  . pantoprazole  40 mg Oral BID  . polyethylene glycol  17 g Oral Daily  . rosuvastatin  40 mg Oral Daily  . senna-docusate  1 tablet Oral BID  . sodium chloride flush  10-40 mL Intracatheter Q12H    have reviewed scheduled and prn medications.

## 2019-08-24 NOTE — Progress Notes (Signed)
[facility] PROGRESS NOTE   [name] MLY:[id number] DOB: 1955/04/11 DOA: 08/12/2019  PCP: [name], MD  Brief History/Interval Summary:  64 y.o. female with history of hypertension, seizure disorder, hyperlipidemia, chronic kidney disease stage IIIb, papillary thyroid carcinoma status post thyroidectomy, and and right renal mass status post laparoscopic partial nephrectomy on 08/05/2019 who returned to the emergency department on 08/12/2019 with 2 to 3 days of nausea and vomiting and  was found to have an acute kidney injury and partial SBO secondary to incisional hernia.  Patient was admitted to the urology service and taken to the operating room that same day for port site incisional hernia repair. 6/18: Incisional hernia repair.  6/28: Heart rate elevated, EKG showed A. fib with RVR.  Hospitalist consulted.  Spontaneously resolved.   Patient also continued to have worsening renal functions, followed by nephrology and currently improving.   Antibiotics: Anti-infectives (From admission, onward)   Start     Dose/Rate Route Frequency Ordered Stop   08/12/19 2300  cefTRIAXone (ROCEPHIN) 2 g in sodium chloride 0.9 % 100 mL IVPB  Status:  Discontinued        2 g 200 mL/hr over 30 Minutes Intravenous Daily at bedtime 08/12/19 2226 08/21/19 1112   08/12/19 1145  cefTRIAXone (ROCEPHIN) 1 g in sodium chloride 0.9 % 100 mL IVPB        1 g 200 mL/hr over 30 Minutes Intravenous  Once 08/12/19 1140 08/12/19 1320      Subjective/Interval History: Patient seen and examined.  Denies any complaints.  Uneventful night.  Remains in sinus rhythm.   Eating well.  Had a bowel movement last night.  Minimum pain on mobility.      Assessment/Plan:  Paroxysmal atrial fibrillation:  Developed A. fib with RVR, treated with Cardizem drip with improvement, echocardiogram normal systolic function.  Elevated pulmonary pressure.  VQ scan with low probability of PE.  Doppler studies  negative. Changed to oral Cardizem.  Remains in sinus rhythm. Started on Eliquis.  Tolerating well.  Acute kidney injury superimposed on chronic kidney disease stage IIIb/hypokalemia: Baseline creatinine is around 1.5-1.7.  Presented with a creatinine of 5.89.  Followed by nephrology.  Urine output is picking up, no evidence of uremia, creatinine persistently elevated.  Remains on maintenance IV fluids with bicarbonate.  Nephrology is actively following, anticipating recovery.  CT scan without evidence of urinary obstruction or hydronephrosis.   Small bowel obstruction secondary to incisional hernia status post repair on 08/12/2019 Symptoms resolved.  Followed by urology.  Tolerating diet with normal bowel function.  Essential hypertension Her antihypertensives including ACE inhibitor and Norvasc were held.   Now only on Cardizem with which blood pressure seems to be adequately well controlled.  The dose had to be reduced a few days ago due to borderline low blood pressures. Pressure remains stable.  Hypothyroidism TSH was normal.  She is on levothyroxine.  Normocytic anemia Hemoglobin is low but stable.  No overt bleeding noted.  Remains on Eliquis.  History of seizure disorder Continue Keppra and Lamictal.  No seizure activity here in the hospital.  Seems to be tolerating her oral medications.  Morbid obesity Estimated body mass index is 41.24 kg/m as calculated from the following:   Height as of this encounter: 5\' 5"  (1.651 m).   Weight as of this encounter: 112.4 kg.   DVT Prophylaxis: SCDs plus Eliquis.   Code Status: Full code   Medications:  Scheduled: . apixaban  5 mg  Oral BID  . Chlorhexidine Gluconate Cloth  6 each Topical Daily  . darbepoetin (ARANESP) injection - NON-DIALYSIS  100 mcg Subcutaneous Q Sun-1800  . diltiazem  180 mg Oral Daily  . ezetimibe  10 mg Oral Daily  . FLUoxetine  10 mg Oral Daily  . lamoTRIgine  75 mg Oral BID  . levETIRAcetam  1,500 mg  Oral BID  . levothyroxine  137 mcg Oral Q0600  . mouth rinse  15 mL Mouth Rinse BID  . metoCLOPramide (REGLAN) injection  5 mg Intravenous Q8H  . pantoprazole  40 mg Oral BID  . polyethylene glycol  17 g Oral Daily  . rosuvastatin  40 mg Oral Daily  . senna-docusate  1 tablet Oral BID  . sodium chloride flush  10-40 mL Intracatheter Q12H   Continuous: . sodium chloride 75 mL/hr at 08/23/19 1642   QVZ:DGLOVFIEPPIRJJO **OR** diphenhydrAMINE, HYDROmorphone (DILAUDID) injection, labetalol, ondansetron, oxyCODONE, phenol, sodium chloride flush, zolpidem   Objective:  Vital Signs  Vitals:   08/23/19 2105 08/24/19 0500 08/24/19 0712 08/24/19 1220  BP: (!) 143/76  128/61 132/66  Pulse: 84  83 84  Resp: 20  20   Temp: 98.1 F (36.7 C)  98.1 F (36.7 C) 98.6 F (37 C)  TempSrc: Oral  Oral Oral  SpO2: 100%  97% 96%  Weight:  112.4 kg    Height:        Intake/Output Summary (Last 24 hours) at 08/24/2019 1531 Last data filed at 08/24/2019 1000 Gross per 24 hour  Intake 436 ml  Output 1200 ml  Net -764 ml   Filed Weights   08/23/19 0500 08/23/19 0516 08/24/19 0500  Weight: 118.2 kg 118.2 kg 112.4 kg   Physical Exam Constitutional:      Comments: Patient looks comfortable.  Not in any distress.  She is chronically sick looking.  HENT:     Head: Normocephalic.  Cardiovascular:     Rate and Rhythm: Normal rate and regular rhythm.  Pulmonary:     Breath sounds: Normal breath sounds.     Comments: Decreased air entry at bases. Abdominal:     General: Abdomen is flat.     Palpations: Abdomen is soft.     Comments: Bowel sounds present.  She has incisions that are clean and dry.  Genitourinary:    Comments: Foley catheter with clear urine.     Lab Results:  Data Reviewed: I have personally reviewed following labs and imaging studies  CBC: Recent Labs  Lab 08/18/19 0216 08/18/19 0216 08/19/19 0459 08/19/19 0459 08/20/19 0454 08/21/19 0512 08/22/19 0500  08/23/19 0433 08/24/19 0432  WBC 8.6   < > 18.2*   < > 14.3* 13.4* 9.5 8.8 10.2  NEUTROABS 5.6  --  13.5*  --  10.7*  --   --   --   --   HGB 9.2*   < > 9.1*   < > 7.8* 8.7* 7.9* 7.6* 10.1*  HCT 29.5*   < > 29.3*   < > 24.6* 26.4* 23.8* 23.1* 31.1*  MCV 87.3   < > 87.2   < > 85.7 84.1 82.1 83.1 85.2  PLT 321   < > 287   < > 302 270 338 340 319   < > = values in this interval not displayed.    Basic Metabolic Panel: Recent Labs  Lab 08/20/19 0454 08/21/19 0530 08/22/19 0507 08/23/19 0433 08/24/19 0432  NA 136 143 143 140 139  K 2.9* 2.9*  4.3 3.2* 3.4*  CL 103 105 103 98 99  CO2 23 27 28 29 29   GLUCOSE 141* 109* 100* 101* 91  BUN 19 20 22 22  24*  CREATININE 4.89* 5.83* 6.39* 6.56* 6.49*  CALCIUM 7.4* 7.4* 7.5* 7.5* 7.8*  PHOS  --  2.7 2.6 2.8 3.3    GFR: Estimated Creatinine Clearance: 10.8 mL/min (A) (by C-G formula based on SCr of 6.49 mg/dL (H)).   CBG: Recent Labs  Lab 08/23/19 0549 08/23/19 1137 08/23/19 1646 08/24/19 0053 08/24/19 1210  GLUCAP 93 91 92 91 75      Recent Results (from the past 240 hour(s))  Culture, Urine     Status: None   Collection Time: 08/19/19  9:40 AM   Specimen: Urine, Catheterized  Result Value Ref Range Status   Specimen Description   Final    URINE, CATHETERIZED Performed at [facility], [place] [address], [place], [place] [address]    Special Requests   Final    NONE Performed at [facility], [place] [address], [place], [place] [address]    Culture   Final    NO GROWTH Performed at [facility], [place] [address], [place], [place] [address]    Report Status 08/20/2019 FINAL  Final  SARS Coronavirus 2 by RT PCR (hospital order, performed in [facility] hospital lab) Nasopharyngeal Nasopharyngeal Swab     Status: None   Collection Time: 08/24/19 10:29 AM   Specimen: Nasopharyngeal Swab  Result Value Ref Range Status   SARS Coronavirus 2 NEGATIVE NEGATIVE Final    Comment:  (NOTE) SARS-CoV-2 target nucleic acids are NOT DETECTED.  The SARS-CoV-2 RNA is generally detectable in upper and lower respiratory specimens during the acute phase of infection. The lowest concentration of SARS-CoV-2 viral copies this assay can detect is 250 copies / mL. A negative result does not preclude SARS-CoV-2 infection and should not be used as the sole basis for treatment or other patient management decisions.  A negative result may occur with improper specimen collection / handling, submission of specimen other than nasopharyngeal swab, presence of viral mutation(s) within the areas targeted by this assay, and inadequate number of viral copies (<250 copies / mL). A negative result must be combined with clinical observations, patient history, and epidemiological information.  Fact Sheet for Patients:   StrictlyIdeas.no  Fact Sheet for Healthcare Providers: BankingDealers.co.za  This test is not yet approved or  cleared by the [place] FDA and has been authorized for detection and/or diagnosis of SARS-CoV-2 by FDA under an Emergency Use Authorization (EUA).  This EUA will remain in effect (meaning this test can be used) for the duration of the COVID-19 declaration under Section 564(b)(1) of the Act, 21 U.S.C. section 360bbb-3(b)(1), unless the authorization is terminated or revoked sooner.  Performed at [facility], [place] [address], [place], [place] [address]       Radiology Studies: CT ABDOMEN PELVIS WO CONTRAST  Result Date: 08/23/2019 CLINICAL DATA:  New onset renal failure, history of recent right renal biopsy and recent hernia surgery EXAM: CT ABDOMEN AND PELVIS WITHOUT CONTRAST TECHNIQUE: Multidetector CT imaging of the abdomen and pelvis was performed following the standard protocol without IV contrast. COMPARISON:  08/12/2019 FINDINGS: Lower chest: Mild bibasilar atelectatic changes are seen.  No sizable effusion or pneumothorax is noted. Decreased attenuation is noted in the cardiac blood pool suggestive of anemia. Hepatobiliary: Vicarious excretion of contrast is noted within the gallbladder which  is well distended. The liver is within normal limits as visualized. Pancreas: Unremarkable. No pancreatic ductal dilatation or surrounding inflammatory changes. Spleen: Normal in size without focal abnormality. Adrenals/Urinary Tract: Adrenal glands are stable. No obstructive changes are noted bilaterally. Postsurgical changes are noted in the right kidney similar to that noted on the prior exam. No sizable hematoma is noted. The bladder is decompressed by Foley catheter. Stomach/Bowel: Colon is well visualized and within normal limits. The appendix is not as well visualized as on the prior exam. No inflammatory changes to suggest appendicitis are noted however. Small bowel shows no obstructive changes. Previously seen incisional hernia has been repaired. Vascular/Lymphatic: Aortic atherosclerosis. No enlarged abdominal or pelvic lymph nodes. Reproductive: Status post hysterectomy. No adnexal masses. Other: Minimal free fluid is noted within the abdomen consistent with the recent surgical history. Subcutaneous air is noted consistent with prior laparoscopy is well. Musculoskeletal: No acute or significant osseous findings. IMPRESSION: Resolution of previously seen port site hernia and small-bowel obstruction. Postoperative changes are noted with a minimal amount of free fluid within the abdomen as well as significant subcutaneous emphysema. Changes suggestive of anemia as described above. No other focal abnormality is noted. Electronically Signed   By: Inez Catalina M.D.   On: 08/23/2019 11:07       LOS: 12 days    Total time spent: 25 minutes  Jaryn Hocutt  08/24/2019, 3:31 PM

## 2019-08-24 NOTE — Progress Notes (Signed)
Responded to consult for PIV. Both arms assessed with [place]. No appropriate veins for PIV due to size and depth. Not appropriate for midline due to renal status. Recommend considering changing IV medications to PO or considering CVAD if IV treatment remains necessary.

## 2019-08-24 NOTE — Progress Notes (Signed)
12 Days Post-Op   Subjective/Chief Complaint:    1 - Small Bowel Obstruction / Incisional Hernia - s/p emergent laparosopic hernia reduction / repair 08/12/19, day of admission, for high grade SBO. NPO with NGT post-op. Regaln started 6/21. Resumed bowel function 6/22 and clears started. Advanced to reg diet 6/23. CT 6/29 again confirmed resolved obstruction.  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 Ipsilateral cyst decortication benign.   3 - Acute on Chronic Renal Failure - Cr 6's with poor PO intake and ACEI on admit 6/18. CT no hydro / fluid collections. Baseline Cr 1.6's.  Some fluctuations this admission, nephrology kindly following. Renal US this admission w/o hydro or large urinomas. CT 6/29 again confirmed resolved obstruction and no urinomas / hydronephrosis.   4 - Symptomatic Anemia - slow Hgb post-op now stabilized high 7s. GFR declien as per above.   5 - Disposition / Rehab - independent at baseline, daughter [name] helps with errands. Deconditioned with several back to back hospital stays. SNF placement pending at [facility] in [place]. Repeat C19 negative 6/30 in preparation for transfer.    Today "Vesta" is stable, transufsion yesterday and appropriate response. CT yesterday too also reassuring with resolved GI obstruction and no urinomas / hydro.     Objective: Vital signs in last 24 hours: Temp:  [98.1 F (36.7 C)-98.7 F (37.1 C)] 98.6 F (37 C) (06/30 1220) Pulse Rate:  [82-84] 84 (06/30 1220) Resp:  [20] 20 (06/30 0712) BP: (128-143)/(61-76) 132/66 (06/30 1220) SpO2:  [96 %-100 %] 96 % (06/30 1220) Weight:  [112.4 kg] 112.4 kg (06/30 0500) Last BM Date: 08/23/19  Intake/Output from previous day: 06/29 0701 - 06/30 0700 In: 1373.5 [P.O.:180; I.V.:500; Blood:693.5] Out: 2100 [Urine:2100] Intake/Output this shift: Total I/O In: 89.7 [I.V.:89.7] Out: -    General appearance: alert, cooperative. More energetic today after transfusion. Eyes: negative Nose: Nares normal. Septum midline. Mucosa normal. No drainage or sinus tenderness. Throat: lips, mucosa, and tongue normal; teeth and gums normal Neck: thyroid not enlarged, symmetric, no tenderness/mass/nodules and old Rt neck line dressing c/d/i, no hematomas.  Back: symmetric, no curvature. ROM normal. No CVA tenderness. Resp: non-labored on [place] O2.  Cardio: sinus with HR mid 80s by monitor.  GI: stable obesity and decreased distension. Recent port sites c/d/i.  Pelvic: external genitalia normal with purewick in place.  Extremities: extremities normal, atraumatic, no cyanosis or edema. RUE edema resolved. Lymph nodes: Cervical, supraclavicular, and axillary nodes normal. Neurologic: Grossly normal  Incision/Wound: as per above.   Lab Results:  Recent Labs    08/23/19 0433 08/24/19 0432  WBC 8.8 10.2  HGB 7.6* 10.1*  HCT 23.1* 31.1*  PLT 340 319   BMET Recent Labs    08/23/19 0433 08/24/19 0432  NA 140 139  K 3.2* 3.4*  CL 98 99  CO2 29 29  GLUCOSE 101* 91  BUN 22 24*  CREATININE 6.56* 6.49*  CALCIUM 7.5* 7.8*   PT/INR No results for input(s): LABPROT, INR in the last 72 hours. ABG No results for input(s): PHART, HCO3 in the last 72 hours.  Invalid input(s): PCO2, PO2  Studies/Results: CT ABDOMEN PELVIS WO CONTRAST  Result Date: 08/23/2019 CLINICAL DATA:  New onset renal failure, history of recent right renal biopsy and recent hernia surgery EXAM: CT ABDOMEN AND PELVIS WITHOUT CONTRAST TECHNIQUE: Multidetector CT imaging of the abdomen and pelvis was performed following  the standard protocol without IV contrast. COMPARISON:  08/12/2019 FINDINGS: Lower chest: Mild bibasilar atelectatic changes are seen. No sizable effusion or pneumothorax is noted. Decreased attenuation is noted in the cardiac blood pool suggestive of anemia. Hepatobiliary: Vicarious excretion of  contrast is noted within the gallbladder which is well distended. The liver is within normal limits as visualized. Pancreas: Unremarkable. No pancreatic ductal dilatation or surrounding inflammatory changes. Spleen: Normal in size without focal abnormality. Adrenals/Urinary Tract: Adrenal glands are stable. No obstructive changes are noted bilaterally. Postsurgical changes are noted in the right kidney similar to that noted on the prior exam. No sizable hematoma is noted. The bladder is decompressed by Foley catheter. Stomach/Bowel: Colon is well visualized and within normal limits. The appendix is not as well visualized as on the prior exam. No inflammatory changes to suggest appendicitis are noted however. Small bowel shows no obstructive changes. Previously seen incisional hernia has been repaired. Vascular/Lymphatic: Aortic atherosclerosis. No enlarged abdominal or pelvic lymph nodes. Reproductive: Status post hysterectomy. No adnexal masses. Other: Minimal free fluid is noted within the abdomen consistent with the recent surgical history. Subcutaneous air is noted consistent with prior laparoscopy is well. Musculoskeletal: No acute or significant osseous findings. IMPRESSION: Resolution of previously seen port site hernia and small-bowel obstruction. Postoperative changes are noted with a minimal amount of free fluid within the abdomen as well as significant subcutaneous emphysema. Changes suggestive of anemia as described above. No other focal abnormality is noted. Electronically Signed   By: Inez Catalina M.D.   On: 08/23/2019 11:07    Anti-infectives: Anti-infectives (From admission, onward)   Start     Dose/Rate Route Frequency Ordered Stop   08/12/19 2300  cefTRIAXone (ROCEPHIN) 2 g in sodium chloride 0.9 % 100 mL IVPB  Status:  Discontinued        2 g 200 mL/hr over 30 Minutes Intravenous Daily at bedtime 08/12/19 2226 08/21/19 1112   08/12/19 1145  cefTRIAXone (ROCEPHIN) 1 g in sodium chloride  0.9 % 100 mL IVPB        1 g 200 mL/hr over 30 Minutes Intravenous  Once 08/12/19 1140 08/12/19 1320      Assessment/Plan:  1 - Small Bowel Obstruction / Incisional Hernia - Resolved clinically and radiographically. Continue reg diet.   2 - Right Renal Cancer  - good prognosis. NO further cancer directed therapy this admission.   3 - Acute on Chronic Renal Failure - likely severe pre-renal. Improved some with hydration. Volume status and K remain good. Appreciate nephrol co-managent.  This appears to be turning the corner.   4 - Symptomatic Anemia - rec transfuse 2 units today. K acceptable. Cautionsly optimistic this will improve GFR and energy level some.   5 - Disposition / Rehab - FL2 for SNF preferably in [place] submitted. C19 test negative. We are happy to provide whatever required transfer documentation.   [name] 08/24/2019

## 2019-08-24 NOTE — Progress Notes (Signed)
Occupational Therapy Treatment Patient Details Name: [name] MRN: [id number] DOB: May 10, 1955 Today's Date: 08/24/2019    History of present illness 64 y.o. female with history of hypertension, seizure disorder, hyperlipidemia, chronic kidney disease stage IIIb, papillary thyroid carcinoma status post thyroidectomy, and and right renal mass status post laparoscopic partial nephrectomy on 08/05/2019 who returned to the emergency department on 08/12/2019 with 2 to 3 days of nausea and vomiting, was found to have an acute kidney injury and partial SBO secondary to incisional hernia, s/p emergent laparosopic hernia reduction / repair 08/12/19. Onset afib/RVR. POd 2.   OT comments  Upon arrival, note pills next to patient in her bed, notified RN. Patient min A with bed mobility, min/mod A with sit to stand x3 at edge of bed with significant posterior lean and difficulty weight shifting to attempt transfer to recliner chair. Patient requires max cues for safety with body mechanics, including leaning too far forward/outside base of support reaching for objects on table. Ultimately returned to bed with assist from CNA for positioning and changing of bed linen due to small stool upon standing.   Follow Up Recommendations  SNF;Supervision/Assistance - 24 hour    Equipment Recommendations  None recommended by OT       Precautions / Restrictions Precautions Precautions: Fall Precaution Comments: impulsive  Restrictions Weight Bearing Restrictions: No       Mobility Bed Mobility Overal bed mobility: Needs Assistance Bed Mobility: Supine to Sit;Sit to Supine     Supine to sit: Min assist;HOB elevated Sit to supine: Min assist   General bed mobility comments: Increased time and use of bed rails to transfer to side of the bed. Cues for safety. assist for lifting LEs back onto edge of bed  Transfers Overall transfer level: Needs assistance Equipment used: Rolling walker (2  wheeled) Transfers: Sit to/from Stand Sit to Stand: Min assist;Mod assist         General transfer comment: performed x3 at edge of bed in attempt to side step towards recliner however due to decreased safety ultimately returned to bed    Balance Overall balance assessment: Needs assistance Sitting-balance support: Feet supported;No upper extremity supported Sitting balance-Leahy Scale: Fair   Postural control: Posterior lean Standing balance support: Bilateral upper extremity supported;During functional activity Standing balance-Leahy Scale: Poor Standing balance comment: requires support, posterior lean                           ADL either performed or assessed with clinical judgement   ADL Overall ADL's : Needs assistance/impaired                     Lower Body Dressing: Maximal assistance;Sitting/lateral leans Lower Body Dressing Details (indicate cue type and reason): patient attempt to lean foward to don R sock OT provide min A for safety with sitting balance. pt able to start sock but require max A to complete task Toilet Transfer: Moderate assistance;Minimal assistance;RW;Cueing for safety;Cueing for sequencing Toilet Transfer Details (indicate cue type and reason): attempt transfer to chair however patien with multiple posterior loss of balance and difficulty weight shifting to side step towards recliner therefore returned to bed with assist from Katherine and Hygiene: Total assistance;Sit to/from stand Toileting - Clothing Manipulation Details (indicate cue type and reason): upon standing note small spot of stool on draw sheet, total A in standing due to poor balance/safety to complete peri care while CNA replace  draw sheet. pt denied needing to have BM currently     Functional mobility during ADLs: Minimal assistance;Moderate assistance;Cueing for safety;Cueing for sequencing;Rolling walker General ADL Comments: patient is  high fall risk, recommend x2 for safety as patient has posterior loss of balance, difficulty weight shifting               Cognition Arousal/Alertness: Awake/alert Behavior During Therapy: Impulsive Overall Cognitive Status: No family/caregiver present to determine baseline cognitive functioning Area of Impairment: Safety/judgement;Problem solving                         Safety/Judgement: Decreased awareness of safety;Decreased awareness of deficits   Problem Solving: Requires verbal cues;Requires tactile cues General Comments: patient keeps asking to go downstairs "to walk and then come back" patient also leaning forward on bed to reach far outside her base of support OT redirect for safety                    Pertinent Vitals/ Pain       Pain Assessment: Faces Faces Pain Scale: No hurt         Frequency  Min 2X/week        Progress Toward Goals  OT Goals(current goals can now be found in the care plan section)  Progress towards OT goals: Progressing toward goals  Acute Rehab OT Goals Patient Stated Goal: to go home OT Goal Formulation: With patient Time For Goal Achievement: 09/07/19 ADL Goals Pt Will Perform Grooming: with supervision;standing Pt Will Perform Lower Body Dressing: with supervision;sit to/from stand Pt Will Transfer to Toilet: with supervision;regular height toilet;ambulating Pt Will Perform Toileting - Clothing Manipulation and hygiene: with supervision;sit to/from stand  Plan Discharge plan remains appropriate       AM-PAC OT "6 Clicks" Daily Activity     Outcome Measure   Help from another person eating meals?: None Help from another person taking care of personal grooming?: A Little Help from another person toileting, which includes using toliet, bedpan, or urinal?: A Lot Help from another person bathing (including washing, rinsing, drying)?: A Lot Help from another person to put on and taking off regular upper body  clothing?: A Little Help from another person to put on and taking off regular lower body clothing?: A Lot 6 Click Score: 16    End of Session Equipment Utilized During Treatment: Rolling walker  OT Visit Diagnosis: Unsteadiness on feet (R26.81);Other abnormalities of gait and mobility (R26.89);Muscle weakness (generalized) (M62.81)   Activity Tolerance Patient tolerated treatment well   Patient Left in bed;with call bell/phone within reach;with bed alarm set   Nurse Communication Mobility status;Other (comment) (found pills in bed with patient)        Time: 1326-1350 OT Time Calculation (min): 24 min  Charges: OT General Charges $OT Visit: 1 Visit OT Treatments $Self Care/Home Management : 23-37 mins  Delbert Phenix OT Pager: [place] 08/24/2019, 2:56 PM

## 2019-08-25 DIAGNOSIS — E039 Hypothyroidism, unspecified: Secondary | ICD-10-CM | POA: Diagnosis not present

## 2019-08-25 LAB — CBC
HCT: 31.5 % — ABNORMAL LOW (ref 36.0–46.0)
Hemoglobin: 10.3 g/dL — ABNORMAL LOW (ref 12.0–15.0)
MCH: 27.8 pg (ref 26.0–34.0)
MCHC: 32.7 g/dL (ref 30.0–36.0)
MCV: 84.9 fL (ref 80.0–100.0)
Platelets: 345 10*3/uL (ref 150–400)
RBC: 3.71 MIL/uL — ABNORMAL LOW (ref 3.87–5.11)
RDW: 15.9 % — ABNORMAL HIGH (ref 11.5–15.5)
WBC: 10.1 10*3/uL (ref 4.0–10.5)
nRBC: 0 % (ref 0.0–0.2)

## 2019-08-25 LAB — GLUCOSE, CAPILLARY
Glucose-Capillary: 79 mg/dL (ref 70–99)
Glucose-Capillary: 86 mg/dL (ref 70–99)
Glucose-Capillary: 98 mg/dL (ref 70–99)
Glucose-Capillary: 99 mg/dL (ref 70–99)

## 2019-08-25 LAB — RENAL FUNCTION PANEL
Albumin: 2.1 g/dL — ABNORMAL LOW (ref 3.5–5.0)
Anion gap: 13 (ref 5–15)
BUN: 24 mg/dL — ABNORMAL HIGH (ref 8–23)
CO2: 26 mmol/L (ref 22–32)
Calcium: 7.9 mg/dL — ABNORMAL LOW (ref 8.9–10.3)
Chloride: 99 mmol/L (ref 98–111)
Creatinine, Ser: 6.43 mg/dL — ABNORMAL HIGH (ref 0.44–1.00)
GFR calc Af Amer: 7 mL/min — ABNORMAL LOW (ref >60)
GFR calc non Af Amer: 6 mL/min — ABNORMAL LOW (ref >60)
Glucose, Bld: 90 mg/dL (ref 70–99)
Phosphorus: 3.5 mg/dL (ref 2.5–4.6)
Potassium: 3.6 mmol/L (ref 3.5–5.1)
Sodium: 138 mmol/L (ref 135–145)

## 2019-08-25 NOTE — Progress Notes (Signed)
Physical Therapy Treatment Patient Details Name: [name] MRN: [id number] DOB: [birth year] Today's Date: 08/25/2019    History of Present Illness 64 y.o. female with history of hypertension, seizure disorder, hyperlipidemia, chronic kidney disease stage IIIb, papillary thyroid carcinoma status post thyroidectomy, and and right renal mass status post laparoscopic partial nephrectomy on 08/05/2019 who returned to the emergency department on 08/12/2019 with 2 to 3 days of nausea and vomiting, was found to have an acute kidney injury and partial SBO secondary to incisional hernia, s/p emergent laparosopic hernia reduction / repair 08/12/19. Onset afib/RVR.    PT Comments    Pt assisted with standing and ambulating in hallway with recliner following for safety.  Pt requiring at least mod assist due to strong posterior lean upon standing requiring time to correct.  Continue to recommend SNF upon d/c.    Follow Up Recommendations  SNF     Equipment Recommendations  3in1 (PT)    Recommendations for Other Services       Precautions / Restrictions Precautions Precautions: Fall Restrictions Weight Bearing Restrictions: No    Mobility  Bed Mobility Overal bed mobility: Needs Assistance Bed Mobility: Supine to Sit     Supine to sit: Min assist     General bed mobility comments: pt initiated movement, assist for scooting to EOB, utilized bed pad  Transfers Overall transfer level: Needs assistance Equipment used: Rolling walker (2 wheeled) Transfers: Sit to/from Stand Sit to Stand: Mod assist;+2 safety/equipment         General transfer comment: cues for hand placement however pt attempting without following cues, initially min assist however mod assist after (x2), pt also with full extension pattern upon standing causing posterior weight shift and pt to lift up RW; pt with difficulty following cues to correct however able with time  Ambulation/Gait Ambulation/Gait assistance:  Min assist;+2 safety/equipment Gait Distance (Feet): 50 Feet Assistive device: Rolling walker (2 wheeled) Gait Pattern/deviations: Step-through pattern;Decreased stride length     General Gait Details: assist to maneuver RW and manage pace (pt tends to lean forward and fast pace), cues for RW positioning and posture, one seated rest break, assist increased with second ambulation attempt so had pt return to sitting for safety   Stairs             Wheelchair Mobility    Modified Rankin (Stroke Patients Only)       Balance Overall balance assessment: Needs assistance         Standing balance support: Bilateral upper extremity supported;During functional activity Standing balance-Leahy Scale: Poor Standing balance comment: requires support, posterior lean                            Cognition Arousal/Alertness: Awake/alert Behavior During Therapy: Flat affect Overall Cognitive Status: No family/caregiver present to determine baseline cognitive functioning Area of Impairment: Safety/judgement;Problem solving                         Safety/Judgement: Decreased awareness of safety;Decreased awareness of deficits   Problem Solving: Requires verbal cues;Requires tactile cues General Comments: pt responding appropriately to questions but remained quiet most of session      Exercises      General Comments        Pertinent Vitals/Pain Pain Assessment: Faces Faces Pain Scale: No hurt Pain Intervention(s): Repositioned    Home Living  Prior Function            PT Goals (current goals can now be found in the care plan section) Progress towards PT goals: Progressing toward goals    Frequency    Min 2X/week      PT Plan Current plan remains appropriate    Co-evaluation              AM-PAC PT "6 Clicks" Mobility   Outcome Measure  Help needed turning from your back to your side while in a flat bed  without using bedrails?: A Little Help needed moving from lying on your back to sitting on the side of a flat bed without using bedrails?: A Little Help needed moving to and from a bed to a chair (including a wheelchair)?: A Lot Help needed standing up from a chair using your arms (e.g., wheelchair or bedside chair)?: A Lot Help needed to walk in hospital room?: A Lot Help needed climbing 3-5 steps with a railing? : Total 6 Click Score: 13    End of Session Equipment Utilized During Treatment: Gait belt Activity Tolerance: Patient tolerated treatment well Patient left: in chair;with chair alarm set;with call bell/phone within reach Nurse Communication: Mobility status PT Visit Diagnosis: Difficulty in walking, not elsewhere classified (R26.2);History of falling (Z91.81);Unsteadiness on feet (R26.81)     Time: 1433-1450 PT Time Calculation (min) (ACUTE ONLY): 17 min  Charges:  $Gait Training: 8-22 mins                    Arlyce Dice, DPT Acute Rehabilitation Services Pager: [phone] Office: [phone]  York Ram E 08/25/2019, 3:52 PM

## 2019-08-25 NOTE — TOC Progression Note (Signed)
Transition of Care [facility]) - Progression Note    Patient Details  Name: [name] MRN: [id number] Date of Birth: Sep 20, 1955  [facility] CM/SW Contact  Purcell Mouton, RN Phone Number: 08/25/2019, 2:12 PM  Clinical Narrative:    Pt can go to [facility] SNF in AM.     Expected Discharge Plan: Home/Self Care Barriers to Discharge: Continued Medical Work up  Expected Discharge Plan and Services Expected Discharge Plan: Home/Self Care       Living arrangements for the past 2 months: Apartment                                       Social Determinants of Health (SDOH) Interventions    Readmission Risk Interventions No flowsheet data found.

## 2019-08-25 NOTE — Progress Notes (Signed)
Subjective:  I/O yest -1.3 L yest w/ 1.6 L UOP. Lost IV access. Eating well, no confusion or N/V. Tired.    Objective Vital signs in last 24 hours: Vitals:   08/24/19 1220 08/24/19 2158 08/25/19 0538 08/25/19 0901  BP: 132/66 (!) 153/72 (!) 152/71 (!) 154/74  Pulse: 84 78 71 81  Resp:  16 18   Temp: 98.6 F (37 C) 98.2 F (36.8 C) 98.5 F (36.9 C)   TempSrc: Oral Oral Oral   SpO2: 96% 100% 100%   Weight:   115.6 kg   Height:       Weight change: 3.2 kg  Intake/Output Summary (Last 24 hours) at 08/25/2019 1022 Last data filed at 08/25/2019 1000 Gross per 24 hour  Intake 571.41 ml  Output 1725 ml  Net -1153.59 ml   Physical Exam: General: alert, NAD Heart: RRR Lungs: clear bilat to bases, no rales / wheezing Abdomen: soft, nondistended, nontender Extremities: no sig edema GU: foley in place draining clear yellow urine   Summary: Pt is a 64 y.o. yo female with HTN, obesity, sz d/o  who was admitted on 08/12/2019 with lap robotic assisted partial nephrect c/b re operation for hernia/SBO c/b AKI  Assessment/Plan: 1. AoCKD 3b - b/l creat 1.4- 1.8. Here for R partial nephrectomy for renal mass done on 6/11 w/ about 50% of R kidney removed. Then had AKI felt to be hemodynamic > creat peaked at 5.6 on 6/19, then improved to 2.8 on 6/24. Then developed AKI again, nonoliguric initially w/ soft BP's and UTI. Lowered diltiazem dose. UA checked > not remarkable. Renal US 6/27 showed no hydro.  Creat was up to 6.3 then 6.5 yest but today is down slightly to 6.4.  Foley in place. IVF's stopped yest due to loss of IV access. Will ask IR to see for central IJ PICC.  May be starting to recover (again). No indication for RRT at this time, pt fully oriented and eating. Is fatigued. Will resume IVF"s once line is in. Will follow.  2. Metabolic acidosis-  Resolved after bicarb fluids 3. Anemia- fairly had been stable- 8-9-   Down further but a little variable -  Give ESA dose 4. HTN/volume-  Lowered  cardizem for soft BP's which are now better, no other BP meds. BP's normal and euvolemic on exam   5. Paroxysmal atrial fib: getting po diltiazem, eliquis. We should prob get her off eliquis on on IV heparin until she is more stable from renal standpoint because she may need other procedures 6. SBO due to incisional hernia, sp repair 6/18 7. H/o seizure d/o - getting keppra / lamictal per pmd 8.  R renal mass/ cancer - sp R robotic partial nephrectomy 08/05/19  Kelly Splinter, MD 08/25/2019, 10:22 AM      Labs: Basic Metabolic Panel: Recent Labs  Lab 08/23/19 0433 08/24/19 0432 08/25/19 0428  NA 140 139 138  K 3.2* 3.4* 3.6  CL 98 99 99  CO2 29 29 26   GLUCOSE 101* 91 90  BUN 22 24* 24*  CREATININE 6.56* 6.49* 6.43*  CALCIUM 7.5* 7.8* 7.9*  PHOS 2.8 3.3 3.5   Liver Function Tests: Recent Labs  Lab 08/23/19 0433 08/24/19 0432 08/25/19 0428  ALBUMIN 1.8* 2.0* 2.1*   No results for input(s): LIPASE, AMYLASE in the last 168 hours. No results for input(s): AMMONIA in the last 168 hours. CBC: Recent Labs  Lab 08/19/19 0459 08/19/19 0459 08/20/19 0454 08/20/19 0454 08/21/19 7408 08/21/19 1448  08/22/19 0500 08/22/19 0500 08/23/19 0433 08/24/19 0432 08/25/19 0428  WBC 18.2*   < > 14.3*   < > 13.4*   < > 9.5   < > 8.8 10.2 10.1  NEUTROABS 13.5*  --  10.7*  --   --   --   --   --   --   --   --   HGB 9.1*   < > 7.8*   < > 8.7*   < > 7.9*   < > 7.6* 10.1* 10.3*  HCT 29.3*   < > 24.6*   < > 26.4*   < > 23.8*   < > 23.1* 31.1* 31.5*  MCV 87.2   < > 85.7   < > 84.1  --  82.1  --  83.1 85.2 84.9  PLT 287   < > 302   < > 270   < > 338   < > 340 319 345   < > = values in this interval not displayed.   Cardiac Enzymes: No results for input(s): CKTOTAL, CKMB, CKMBINDEX, TROPONINI in the last 168 hours. CBG: Recent Labs  Lab 08/23/19 1646 08/24/19 0053 08/24/19 1210 08/25/19 0009 08/25/19 0533  GLUCAP 92 91 75 99 79    Iron Studies:  Recent Labs    08/24/19 0432   IRON 92  TIBC 181*  FERRITIN 476*     Medications: Infusions:  sodium chloride 75 mL/hr at 08/24/19 1829    Scheduled Medications:  apixaban  5 mg Oral BID   Chlorhexidine Gluconate Cloth  6 each Topical Daily   darbepoetin (ARANESP) injection - NON-DIALYSIS  100 mcg Subcutaneous Q Sun-1800   diltiazem  180 mg Oral Daily   ezetimibe  10 mg Oral Daily   FLUoxetine  10 mg Oral Daily   lamoTRIgine  75 mg Oral BID   levETIRAcetam  1,500 mg Oral BID   levothyroxine  137 mcg Oral Q0600   mouth rinse  15 mL Mouth Rinse BID   metoCLOPramide (REGLAN) injection  5 mg Intravenous Q8H   pantoprazole  40 mg Oral BID   polyethylene glycol  17 g Oral Daily   rosuvastatin  40 mg Oral Daily   senna-docusate  1 tablet Oral BID   sodium chloride flush  10-40 mL Intracatheter Q12H    have reviewed scheduled and prn medications.

## 2019-08-25 NOTE — Discharge Summary (Signed)
Physician Discharge Summary  Patient ID: [name] MRN: [id number] DOB/AGE: 1955/06/06 64 y.o.  Admit date: 08/12/2019 Discharge date: 08/25/2019  Admission Diagnoses: Small Bowel Obstruction due to [facility] Hernia  Discharge Diagnoses:  Active Problems:   Seizures ([place])   Incisional hernia with gangrene and obstruction   Atrial fibrillation with RVR ([place])   Hypothyroidism Physical Deconditioning  Discharged Condition: fair  Hospital Course:    1 - Small Bowel Obstruction / Incisional Hernia - s/p emergent laparosopic hernia reduction / repair 08/12/19, day of admission, for high grade SBO. NPO with NGT post-op. Regaln started 6/21. Resumed bowel function 6/22 and clears started. Advanced to reg diet 6/23. CT 6/29 again confirmed resolved obstruction.  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 Ipsilateral cyst decortication benign.   3 - Acute on Chronic Renal Failure - Cr 6's with poor PO intake and ACEI on admit 6/18. CT no hydro / fluid collections. Baseline Cr 1.6's.  Some fluctuations this admission, nephrology kindly following. Renal US this admission w/o hydro or large urinomas. CT 6/29 again confirmed resolved obstruction and no urinomas / hydronephrosis. Hold ACEI and NSAIDS at discharge.   4 - Symptomatic Anemia - slow Hgb post-op now stabilized high 7s, Given 2 units pRBC 6/29 and then stabilized in 11s in effort to maximize renal function.   5 - Disposition / Rehab - independent at baseline, daughter [name] helps with errands. Deconditioned with several back to back hospital stays. SNF placement pending at [facility] in [place]. Repeat C19 negative 6/30 in preparation for transfer.    By 7/2, pt is able to transfer, maintaining PO nutrition, resumed bowel function, and felt to be adequtae for discahrge.   She will need to stay on renal diet.    Consults: internal  medicine, case managment, nephrology  Significant Diagnostic Studies: labs: as per above  Treatments: surgery: as per above  Discharge Exam: Blood pressure 139/70, pulse 76, temperature (!) 97.5 F (36.4 C), temperature source Oral, resp. rate 18, height 5\' 5"  (1.651 m), weight 115.6 kg, SpO2 100 %.   General appearance: alert, cooperative. . Eyes: negative Nose: Nares normal. Septum midline. Mucosa normal. No drainage or sinus tenderness. Throat: lips, mucosa, and tongue normal; teeth and gums normal Neck: thyroid not enlarged, symmetric, no tenderness/mass/nodules and old Rt neck line dressing c/d/i, no hematomas.  Back: symmetric, no curvature. ROM normal. No CVA tenderness. Resp: non-labored on [place] O2.  Cardio: sinus with HR upper 70s by monitor.  GI: stable obesity and decreased distension. Recent port sites c/d/i.  Pelvic: external genitalia normal with purewick in place.  Extremities: extremities normal, atraumatic, no cyanosis or edema. RUE edema resolved. Lymph nodes: Cervical, supraclavicular, and axillary nodes normal. Neurologic: Grossly normal  Incision/Wound: as per above.   Disposition: Dealer.  There are no questions and answers to display.         Allergies as of 08/25/2019      Reactions   Simvastatin Other (See Comments)   Muscle aches      Medication List    STOP taking these medications   benazepril 20 MG tablet Commonly known as: LOTENSIN     TAKE these medications   acetaminophen 500 MG tablet Commonly known as: TYLENOL Take 1 tablet (500 mg total) by mouth 2 (two) times daily. What changed:   when to take this  reasons to  take this   amLODipine 5 MG tablet Commonly known as: NORVASC Take 1 tablet (5 mg total) by mouth daily.   calcitRIOL 0.25 MCG capsule Commonly known as: ROCALTROL Take 0.25 mcg by mouth daily. \   ezetimibe 10 MG tablet Commonly known as: ZETIA Take 1 tablet (10 mg total) by mouth  daily.   FLUoxetine 10 MG capsule Commonly known as: PROZAC Take 1 capsule (10 mg total) by mouth daily.   LaMICtal 150 MG tablet Generic drug: lamoTRIgine Take 75 mg by mouth 2 (two) times daily.   levETIRAcetam 750 MG tablet Commonly known as: KEPPRA Two tablets twice daily What changed:   how much to take  how to take this  when to take this  additional instructions   levothyroxine 137 MCG tablet Commonly known as: SYNTHROID Take 1 tablet (137 mcg total) by mouth daily before breakfast.   loratadine 10 MG tablet Commonly known as: CLARITIN TAKE ONE TABLET BY MOUTH ONCE DAILY.   omeprazole 20 MG capsule Commonly known as: PRILOSEC TAKE 1 CAPSULE BY MOUTH ONCE DAILY.   rosuvastatin 40 MG tablet Commonly known as: CRESTOR TAKE 1 TABLET BY MOUTH ONCE A DAY.       Contact information for follow-up providers    [name], MD Follow up.   Specialty: Urology Why: as previously scheduled for post-op check and staple removal. Feel free to call to verify apointment.  Contact information: [place] [address] [phone]            Contact information for after-discharge care    [facility] .   Service: Skilled Nursing Contact information: [address] [place] [phone]                  Signed: [name] 08/26/2019

## 2019-08-25 NOTE — Progress Notes (Signed)
PROGRESS NOTE    [name]  YPP:[id number] DOB: 10-Oct-1955 DOA: 08/12/2019 PCP: [name], MD   Chef Complaints: Abdominal pain  Brief Narrative: As per prior attending '64 y.o.femalewith history of hypertension,seizure disorder,hyperlipidemia, chronic kidney disease stage IIIb, papillary thyroid carcinoma status post thyroidectomy, andand right renal mass status post laparoscopic partial nephrectomy on 08/05/2019 who returned to the emergency department on 08/12/2019 with 2 to 3 days of nausea and vomiting and  was found to have anacute kidney injury and partial SBO secondary to incisional hernia. Patient was admitted to the urology service and taken to the operating room that same day for port site incisional hernia repair. 6/18: Incisional hernia repair.  6/28: Heart rate elevated, EKG showed A. fib with RVR.  Hospitalist consulted.  Spontaneously resolved.   Patient also continued to have worsening renal functions" Renal function is stabilized, followed by nephrology.  Subjective: No iv access despite multiple attempts not getting iVF  not been getting up much  no nausea or vomitting, foley with uop 1725 ml and had BM this am    Assessment & Plan:  Small bowel obstruction secondary to incisional hernia status post repair 08/12/2019, symptoms resolved, followed by urology tolerating diet with normal bowel function.  Cont Supportive care  AKI superimposed on CKD stage IIIb: Followed by nephrology baseline creatinine 1.5-1.7 presented with 5.8 creatinine, urine output 1.7l.  On IV fluids but lost IV access and attempts to obtain were unsuccessful multiple times throughout the night.  Nephrology has been notified for further recommendation for PICC line with IV fluids.  Nephrology recommending IR for central IJ PICC, no indication for RRT, continue on IV fluids as per nephrology. Recent Labs  Lab 08/21/19 0530 08/22/19 0507 08/23/19 0433 08/24/19 0432 08/25/19 0428   BUN 20 22 22  24* 24*  CREATININE 5.83* 6.39* 6.56* 6.49* 6.43*   PAF W/ RVR: Status post Cardizem infusion echo with normal systolic function elevated pulmonary pressures VQ scan low probably for PE Dopplers negative, changed to call Cardizem, now in sinus rhythm on Eliquis for anticoagulation.  Hypothyroidism: Euthyroid with normal TSH continue levothyroxine  Normocytic anemia symptomatic anemia needing 2 U PRBC blood transfusion on 6/29 hemoglobin is stable.  Monitor on Eliquis. Recent Labs  Lab 08/21/19 0512 08/22/19 0500 08/23/19 0433 08/24/19 0432 08/25/19 0428  HGB 8.7* 7.9* 7.6* 10.1* 10.3*  HCT 26.4* 23.8* 23.1* 31.1* 31.5*    Seizure disorder on home Keppra and Lamictal.  stable.  Hypertension Home ACE inhibitor Norvasc held, now on Cardizem for A. fib dose was decreased due to borderline low blood pressure.  BP fairly stable.  Monitor  Morbid obesity with BMI 42.  Will benefit with weight loss and healthy lifestyle.  DVT prophylaxis: Eliquis. Code Status: Full code Family Communication: plan of care discussed with patient at bedside.  Status is: Inpatient Remains inpatient appropriate because:IV treatments appropriate due to intensity of illness or inability to take PO and For ongoing management of acute kidney injury  Dispo: The patient is from: Home              Anticipated d/c is to: snf              Anticipated d/c date is: > 3 days              Patient currently is not medically stable to d/c.  Plan for skilled nursing facility once AKI resolves and cleared by nephrology, per primary urology team.  Nutrition: Diet Order  Diet regular Room service appropriate? Yes; Fluid consistency: Thin  Diet effective now                 Body mass index is 42.41 kg/m.  Consultants:see note  Procedures:see note Microbiology:see note  Medications: Scheduled Meds: . apixaban  5 mg Oral BID  . Chlorhexidine Gluconate Cloth  6 each Topical Daily  .  darbepoetin (ARANESP) injection - NON-DIALYSIS  100 mcg Subcutaneous Q Sun-1800  . diltiazem  180 mg Oral Daily  . ezetimibe  10 mg Oral Daily  . FLUoxetine  10 mg Oral Daily  . lamoTRIgine  75 mg Oral BID  . levETIRAcetam  1,500 mg Oral BID  . levothyroxine  137 mcg Oral Q0600  . mouth rinse  15 mL Mouth Rinse BID  . metoCLOPramide (REGLAN) injection  5 mg Intravenous Q8H  . pantoprazole  40 mg Oral BID  . polyethylene glycol  17 g Oral Daily  . rosuvastatin  40 mg Oral Daily  . senna-docusate  1 tablet Oral BID  . sodium chloride flush  10-40 mL Intracatheter Q12H   Continuous Infusions: . sodium chloride 75 mL/hr at 08/24/19 1829    Antimicrobials: Anti-infectives (From admission, onward)   Start     Dose/Rate Route Frequency Ordered Stop   08/12/19 2300  cefTRIAXone (ROCEPHIN) 2 g in sodium chloride 0.9 % 100 mL IVPB  Status:  Discontinued        2 g 200 mL/hr over 30 Minutes Intravenous Daily at bedtime 08/12/19 2226 08/21/19 1112   08/12/19 1145  cefTRIAXone (ROCEPHIN) 1 g in sodium chloride 0.9 % 100 mL IVPB        1 g 200 mL/hr over 30 Minutes Intravenous  Once 08/12/19 1140 08/12/19 1320       Objective: Vitals: Today's Vitals   08/24/19 2136 08/24/19 2158 08/25/19 0538 08/25/19 0901  BP:  (!) 153/72 (!) 152/71 (!) 154/74  Pulse:  78 71 81  Resp:  16 18   Temp:  98.2 F (36.8 C) 98.5 F (36.9 C)   TempSrc:  Oral Oral   SpO2:  100% 100%   Weight:   115.6 kg   Height:      PainSc: 0-No pain       Intake/Output Summary (Last 24 hours) at 08/25/2019 1008 Last data filed at 08/25/2019 0900 Gross per 24 hour  Intake 294.24 ml  Output 1725 ml  Net -1430.76 ml   Filed Weights   08/23/19 0516 08/24/19 0500 08/25/19 0538  Weight: 118.2 kg 112.4 kg 115.6 kg   Weight change: 3.2 kg   Intake/Output from previous day: 06/30 0701 - 07/01 0700 In: 229.4 [P.O.:110; I.V.:119.4] Out: 1725 [Urine:1725] Intake/Output this shift: Total I/O In: 120 [P.O.:120] Out: -    Examination:  General exam: AAOx3 ,NAD, weak appearing. HEENT:Oral mucosa moist, Ear/Nose WNL grossly,dentition normal. Respiratory system: bilaterally clear,no wheezing or crackles,no use of accessory muscle, non tender. Cardiovascular system: S1 & S2 +, regular, No JVD. Gastrointestinal system: Abdomen soft, NT,ND, BS+. Nervous System:Alert, awake, moving extremities and grossly nonfocal Extremities: No edema, distal peripheral pulses palpable.  Skin: No rashes,no icterus. MSK: Normal muscle bulk,tone, power Foley+  Data Reviewed: I have personally reviewed following labs and imaging studies CBC: Recent Labs  Lab 08/19/19 0459 08/19/19 0459 08/20/19 0454 08/20/19 0454 08/21/19 0512 08/22/19 0500 08/23/19 0433 08/24/19 0432 08/25/19 0428  WBC 18.2*   < > 14.3*   < > 13.4* 9.5 8.8 10.2 10.1  NEUTROABS  13.5*  --  10.7*  --   --   --   --   --   --   HGB 9.1*   < > 7.8*   < > 8.7* 7.9* 7.6* 10.1* 10.3*  HCT 29.3*   < > 24.6*   < > 26.4* 23.8* 23.1* 31.1* 31.5*  MCV 87.2   < > 85.7   < > 84.1 82.1 83.1 85.2 84.9  PLT 287   < > 302   < > 270 338 340 319 345   < > = values in this interval not displayed.   Basic Metabolic Panel: Recent Labs  Lab 08/21/19 0530 08/22/19 0507 08/23/19 0433 08/24/19 0432 08/25/19 0428  NA 143 143 140 139 138  K 2.9* 4.3 3.2* 3.4* 3.6  CL 105 103 98 99 99  CO2 27 28 29 29 26   GLUCOSE 109* 100* 101* 91 90  BUN 20 22 22  24* 24*  CREATININE 5.83* 6.39* 6.56* 6.49* 6.43*  CALCIUM 7.4* 7.5* 7.5* 7.8* 7.9*  PHOS 2.7 2.6 2.8 3.3 3.5   GFR: Estimated Creatinine Clearance: 11.1 mL/min (A) (by C-G formula based on SCr of 6.43 mg/dL (H)). Liver Function Tests: Recent Labs  Lab 08/21/19 0530 08/22/19 0507 08/23/19 0433 08/24/19 0432 08/25/19 0428  ALBUMIN 2.0* 1.7* 1.8* 2.0* 2.1*   No results for input(s): LIPASE, AMYLASE in the last 168 hours. No results for input(s): AMMONIA in the last 168 hours. Coagulation Profile: No results for  input(s): INR, PROTIME in the last 168 hours. Cardiac Enzymes: No results for input(s): CKTOTAL, CKMB, CKMBINDEX, TROPONINI in the last 168 hours. BNP (last 3 results) No results for input(s): PROBNP in the last 8760 hours. HbA1C: No results for input(s): HGBA1C in the last 72 hours. CBG: Recent Labs  Lab 08/23/19 1646 08/24/19 0053 08/24/19 1210 08/25/19 0009 08/25/19 0533  GLUCAP 92 91 75 99 79   Lipid Profile: No results for input(s): CHOL, HDL, LDLCALC, TRIG, CHOLHDL, LDLDIRECT in the last 72 hours. Thyroid Function Tests: No results for input(s): TSH, T4TOTAL, FREET4, T3FREE, THYROIDAB in the last 72 hours. Anemia Panel: Recent Labs    08/24/19 0432  FERRITIN 476*  TIBC 181*  IRON 92   Sepsis Labs: Recent Labs  Lab 08/20/19 0454  LATICACIDVEN 0.9    Recent Results (from the past 240 hour(s))  Culture, Urine     Status: None   Collection Time: 08/19/19  9:40 AM   Specimen: Urine, Catheterized  Result Value Ref Range Status   Specimen Description   Final    URINE, CATHETERIZED Performed at [facility] [address] [place], [place] [address]    Special Requests   Final    NONE Performed at [facility], [place] [address] [place], [place] [address]    Culture   Final    NO GROWTH Performed at [facility], [place] [address], [place], [place] [address]    Report Status 08/20/2019 FINAL  Final  SARS Coronavirus 2 by RT PCR (hospital order, performed in [facility] hospital lab) Nasopharyngeal Nasopharyngeal Swab     Status: None   Collection Time: 08/24/19 10:29 AM   Specimen: Nasopharyngeal Swab  Result Value Ref Range Status   SARS Coronavirus 2 NEGATIVE NEGATIVE Final    Comment: (NOTE) SARS-CoV-2 target nucleic acids are NOT DETECTED.  The SARS-CoV-2 RNA is generally detectable in upper and lower respiratory specimens during the acute phase of infection. The lowest concentration of  SARS-CoV-2 viral copies  this assay can detect is 250 copies / mL. A negative result does not preclude SARS-CoV-2 infection and should not be used as the sole basis for treatment or other patient management decisions.  A negative result may occur with improper specimen collection / handling, submission of specimen other than nasopharyngeal swab, presence of viral mutation(s) within the areas targeted by this assay, and inadequate number of viral copies (<250 copies / mL). A negative result must be combined with clinical observations, patient history, and epidemiological information.  Fact Sheet for Patients:   StrictlyIdeas.no  Fact Sheet for Healthcare Providers: BankingDealers.co.za  This test is not yet approved or  cleared by the [place] FDA and has been authorized for detection and/or diagnosis of SARS-CoV-2 by FDA under an Emergency Use Authorization (EUA).  This EUA will remain in effect (meaning this test can be used) for the duration of the COVID-19 declaration under Section 564(b)(1) of the Act, 21 U.S.C. section 360bbb-3(b)(1), unless the authorization is terminated or revoked sooner.  Performed at [facility], [place] [address] [place], [place] [address]       Radiology Studies: CT ABDOMEN PELVIS WO CONTRAST  Result Date: 08/23/2019 CLINICAL DATA:  New onset renal failure, history of recent right renal biopsy and recent hernia surgery EXAM: CT ABDOMEN AND PELVIS WITHOUT CONTRAST TECHNIQUE: Multidetector CT imaging of the abdomen and pelvis was performed following the standard protocol without IV contrast. COMPARISON:  08/12/2019 FINDINGS: Lower chest: Mild bibasilar atelectatic changes are seen. No sizable effusion or pneumothorax is noted. Decreased attenuation is noted in the cardiac blood pool suggestive of anemia. Hepatobiliary: Vicarious excretion of contrast is noted within the gallbladder which is well distended. The  liver is within normal limits as visualized. Pancreas: Unremarkable. No pancreatic ductal dilatation or surrounding inflammatory changes. Spleen: Normal in size without focal abnormality. Adrenals/Urinary Tract: Adrenal glands are stable. No obstructive changes are noted bilaterally. Postsurgical changes are noted in the right kidney similar to that noted on the prior exam. No sizable hematoma is noted. The bladder is decompressed by Foley catheter. Stomach/Bowel: Colon is well visualized and within normal limits. The appendix is not as well visualized as on the prior exam. No inflammatory changes to suggest appendicitis are noted however. Small bowel shows no obstructive changes. Previously seen incisional hernia has been repaired. Vascular/Lymphatic: Aortic atherosclerosis. No enlarged abdominal or pelvic lymph nodes. Reproductive: Status post hysterectomy. No adnexal masses. Other: Minimal free fluid is noted within the abdomen consistent with the recent surgical history. Subcutaneous air is noted consistent with prior laparoscopy is well. Musculoskeletal: No acute or significant osseous findings. IMPRESSION: Resolution of previously seen port site hernia and small-bowel obstruction. Postoperative changes are noted with a minimal amount of free fluid within the abdomen as well as significant subcutaneous emphysema. Changes suggestive of anemia as described above. No other focal abnormality is noted. Electronically Signed   By: Inez Catalina M.D.   On: 08/23/2019 11:07     LOS: 13 days   Antonieta Pert, MD [facility]  08/25/2019, 10:08 AM

## 2019-08-25 NOTE — Progress Notes (Signed)
13 Days Post-Op   Subjective/Chief Complaint:    1 - Small Bowel Obstruction / Incisional Hernia - s/p emergent laparosopic hernia reduction / repair 08/12/19, day of admission, for high grade SBO. NPO with NGT post-op. Regaln started 6/21. Resumed bowel function 6/22 and clears started. Advanced to reg diet 6/23. CT 6/29 again confirmed resolved obstruction.  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 Ipsilateral cyst decortication benign.   3 - Acute on Chronic Renal Failure - Cr 6's with poor PO intake and ACEI on admit 6/18. CT no hydro / fluid collections. Baseline Cr 1.6's.  Some fluctuations this admission, nephrology kindly following. Renal US this admission w/o hydro or large urinomas. CT 6/29 again confirmed resolved obstruction and no urinomas / hydronephrosis.   4 - Symptomatic Anemia - slow Hgb post-op now stabilized high 7s. GFR declien as per above.   5 - Disposition / Rehab - independent at baseline, daughter [name] helps with errands. Deconditioned with several back to back hospital stays. SNF placement pending at [facility] in [place]. Repeat C19 negative 6/30 in preparation for transfer.    Today "Rima" is stable. GFR without change. Remains tollerating PO with bowel function.     Objective: Vital signs in last 24 hours: Temp:  [97.5 F (36.4 C)-98.5 F (36.9 C)] 97.5 F (36.4 C) (07/01 1248) Pulse Rate:  [71-81] 76 (07/01 1248) Resp:  [16-18] 18 (07/01 1248) BP: (139-154)/(70-74) 139/70 (07/01 1248) SpO2:  [100 %] 100 % (07/01 1248) Weight:  [115.6 kg] 115.6 kg (07/01 0538) Last BM Date: 08/25/19  Intake/Output from previous day: 06/30 0701 - 07/01 0700 In: 229.4 [P.O.:110; I.V.:119.4] Out: 1725 [Urine:1725] Intake/Output this shift: Total I/O In: 397.2 [P.O.:220; I.V.:177.2] Out: -    General appearance: alert, cooperative. . Eyes: negative Nose: Nares  normal. Septum midline. Mucosa normal. No drainage or sinus tenderness. Throat: lips, mucosa, and tongue normal; teeth and gums normal Neck: thyroid not enlarged, symmetric, no tenderness/mass/nodules and old Rt neck line dressing c/d/i, no hematomas.  Back: symmetric, no curvature. ROM normal. No CVA tenderness. Resp: non-labored on [place] O2.  Cardio: sinus with HR upper 70s by monitor.  GI: stable obesity and decreased distension. Recent port sites c/d/i.  Pelvic: external genitalia normal with purewick in place.  Extremities: extremities normal, atraumatic, no cyanosis or edema. RUE edema resolved. Lymph nodes: Cervical, supraclavicular, and axillary nodes normal. Neurologic: Grossly normal  Incision/Wound: as per above.   Lab Results:  Recent Labs    08/24/19 0432 08/25/19 0428  WBC 10.2 10.1  HGB 10.1* 10.3*  HCT 31.1* 31.5*  PLT 319 345   BMET Recent Labs    08/24/19 0432 08/25/19 0428  NA 139 138  K 3.4* 3.6  CL 99 99  CO2 29 26  GLUCOSE 91 90  BUN 24* 24*  CREATININE 6.49* 6.43*  CALCIUM 7.8* 7.9*   PT/INR No results for input(s): LABPROT, INR in the last 72 hours. ABG No results for input(s): PHART, HCO3 in the last 72 hours.  Invalid input(s): PCO2, PO2  Studies/Results: No results found.  Anti-infectives: Anti-infectives (From admission, onward)   Start     Dose/Rate Route Frequency Ordered Stop   08/12/19 2300  cefTRIAXone (ROCEPHIN) 2 g in sodium chloride 0.9 % 100 mL IVPB  Status:  Discontinued        2 g 200 mL/hr over 30 Minutes Intravenous Daily at  bedtime 08/12/19 2226 08/21/19 1112   08/12/19 1145  cefTRIAXone (ROCEPHIN) 1 g in sodium chloride 0.9 % 100 mL IVPB        1 g 200 mL/hr over 30 Minutes Intravenous  Once 08/12/19 1140 08/12/19 1320      Assessment/Plan:  1 - Small Bowel Obstruction / Incisional Hernia - Resolved clinically and radiographically. Continue reg diet.   2 - Right Renal Cancer  - good prognosis. NO further cancer  directed therapy this admission.   3 - Acute on Chronic Renal Failure - likely severe pre-renal. Improved some with hydration. Volume status and K remain good. Appreciate nephrol co-managent.  This appears to be turning the corner.   4 - Symptomatic Anemia - rec transfuse 2 units today. K acceptable. Cautionsly optimistic this will improve GFR and energy level some.   5 - Disposition / Rehab - FL2 for SNF preferably in [place] submitted. C19 test negative. We are happy to provide whatever required transfer documentation. She is ready for DC/transfer from our perspective.   [name] 08/25/2019

## 2019-08-26 DIAGNOSIS — R569 Unspecified convulsions: Secondary | ICD-10-CM | POA: Diagnosis not present

## 2019-08-26 LAB — RENAL FUNCTION PANEL
Albumin: 2.2 g/dL — ABNORMAL LOW (ref 3.5–5.0)
Anion gap: 12 (ref 5–15)
BUN: 24 mg/dL — ABNORMAL HIGH (ref 8–23)
CO2: 25 mmol/L (ref 22–32)
Calcium: 8 mg/dL — ABNORMAL LOW (ref 8.9–10.3)
Chloride: 102 mmol/L (ref 98–111)
Creatinine, Ser: 6.14 mg/dL — ABNORMAL HIGH (ref 0.44–1.00)
GFR calc Af Amer: 8 mL/min — ABNORMAL LOW (ref >60)
GFR calc non Af Amer: 7 mL/min — ABNORMAL LOW (ref >60)
Glucose, Bld: 91 mg/dL (ref 70–99)
Phosphorus: 3.2 mg/dL (ref 2.5–4.6)
Potassium: 3.3 mmol/L — ABNORMAL LOW (ref 3.5–5.1)
Sodium: 139 mmol/L (ref 135–145)

## 2019-08-26 LAB — CBC
HCT: 31.5 % — ABNORMAL LOW (ref 36.0–46.0)
Hemoglobin: 10.1 g/dL — ABNORMAL LOW (ref 12.0–15.0)
MCH: 27.6 pg (ref 26.0–34.0)
MCHC: 32.1 g/dL (ref 30.0–36.0)
MCV: 86.1 fL (ref 80.0–100.0)
Platelets: 335 10*3/uL (ref 150–400)
RBC: 3.66 MIL/uL — ABNORMAL LOW (ref 3.87–5.11)
RDW: 16 % — ABNORMAL HIGH (ref 11.5–15.5)
WBC: 9.9 10*3/uL (ref 4.0–10.5)
nRBC: 0 % (ref 0.0–0.2)

## 2019-08-26 LAB — GLUCOSE, CAPILLARY
Glucose-Capillary: 76 mg/dL (ref 70–99)
Glucose-Capillary: 84 mg/dL (ref 70–99)
Glucose-Capillary: 90 mg/dL (ref 70–99)

## 2019-08-26 MED ORDER — PANTOPRAZOLE SODIUM 40 MG PO TBEC: 40.0000 mg | DELAYED_RELEASE_TABLET | Freq: Two times a day (BID) | ORAL | Status: DC

## 2019-08-26 MED ORDER — DILTIAZEM HCL ER COATED BEADS 180 MG PO CP24: 180.0000 mg | ORAL_CAPSULE | Freq: Every day | ORAL | Status: DC

## 2019-08-26 MED ORDER — APIXABAN 5 MG PO TABS: 5.0000 mg | ORAL_TABLET | Freq: Two times a day (BID) | ORAL | Status: DC

## 2019-08-26 NOTE — TOC Progression Note (Signed)
Transition of Care [facility]) - Progression Note    Patient Details  Name: [name] MRN: [id number] Date of Birth: 10-14-1955  Transition of Care [facility]) CM/SW Contact  Purcell Mouton, RN Phone Number: 08/26/2019, 12:39 PM  Clinical Narrative:    Pt's daughter [name] was called [phone] to inform her of pt transferring to [facility]. Telephone number and address was given to daughter. Diane states that pt has had both COVID vaccinations. Diane was instructed that she will need to show COVID card to SNF.    Expected Discharge Plan: Home/Self Care Barriers to Discharge: Continued Medical Work up  Expected Discharge Plan and Services Expected Discharge Plan: Home/Self Care       Living arrangements for the past 2 months: Apartment Expected Discharge Date: 08/26/19                                     Social Determinants of Health (SDOH) Interventions    Readmission Risk Interventions No flowsheet data found.

## 2019-08-26 NOTE — Progress Notes (Signed)
Subjective: creat down today 6.14   Objective Vital signs in last 24 hours: Vitals:   08/25/19 2008 08/25/19 2017 08/26/19 0500 08/26/19 0513  BP: (!) 146/71 (!) 148/83  (!) 144/66  Pulse: 89 86  79  Resp: 16 20  18   Temp: 98.1 F (36.7 C) 98.2 F (36.8 C)  97.8 F (36.6 C)  TempSrc: Oral Oral  Oral  SpO2: 100% 90%  100%  Weight:   115.5 kg   Height:       Weight change: -0.1 kg  Intake/Output Summary (Last 24 hours) at 08/26/2019 1028 Last data filed at 08/26/2019 0900 Gross per 24 hour  Intake 360 ml  Output 1550 ml  Net -1190 ml   Physical Exam: General: alert, NAD Heart: RRR Lungs: clear bilat to bases, no rales / wheezing Abdomen: soft, nondistended, nontender Extremities: no sig edema GU: foley in place draining clear yellow urine   Summary: Pt is a 64 y.o. yo female with HTN, obesity, sz d/o  who was admitted on 08/12/2019 with lap robotic assisted partial nephrect c/b re operation for hernia/SBO c/b AKI  Assessment/Plan: 1. AoCKD 3b - b/l creat 1.4- 1.8. Here for R partial nephrectomy for renal mass done on 6/11 w/ about 50% of R kidney removed. Then had AKI felt to be hemodynamic > creat peaked at 5.6 on 6/19, then improved to 2.8 on 6/24. Then developed AKI again, nonoliguric initially w/ soft BP's and UTI. Lowered diltiazem dose. UA checked > not remarkable. Renal US 6/27 showed no hydro.  Creat peaked at 6.5 and was down to 6.3 yest and 6.1 today. Pt is stable, not uremic with good UOP and stable vol status. OK for dc from renal standpoint.  Has f/u appt in our [place] office (see DC section) on 05/16/53.   2. Metabolic acidosis-  Resolved after bicarb fluids 3. Anemia- fairly had been stable- 8-9 4. HTN/volume-  Lowered cardizem for soft BP's which improved, no other BP meds. BP's normal and euvolemic on exam   5. Paroxysmal atrial fib: getting po diltiazem, eliquis 6. SBO due to incisional hernia, sp repair 6/18 7. H/o seizure d/o - getting keppra / lamictal  per pmd 8.  R renal mass/ cancer - sp R robotic partial nephrectomy 08/05/19  Kelly Splinter, MD 08/26/2019, 10:28 AM      Labs: Basic Metabolic Panel: Recent Labs  Lab 08/24/19 0432 08/25/19 0428 08/26/19 0440  NA 139 138 139  K 3.4* 3.6 3.3*  CL 99 99 102  CO2 29 26 25   GLUCOSE 91 90 91  BUN 24* 24* 24*  CREATININE 6.49* 6.43* 6.14*  CALCIUM 7.8* 7.9* 8.0*  PHOS 3.3 3.5 3.2   Liver Function Tests: Recent Labs  Lab 08/24/19 0432 08/25/19 0428 08/26/19 0440  ALBUMIN 2.0* 2.1* 2.2*   No results for input(s): LIPASE, AMYLASE in the last 168 hours. No results for input(s): AMMONIA in the last 168 hours. CBC: Recent Labs  Lab 08/20/19 0454 08/21/19 0512 08/22/19 0500 08/22/19 0500 08/23/19 0433 08/23/19 0433 08/24/19 0432 08/25/19 0428 08/26/19 0440  WBC 14.3*   < > 9.5   < > 8.8   < > 10.2 10.1 9.9  NEUTROABS 10.7*  --   --   --   --   --   --   --   --   HGB 7.8*   < > 7.9*   < > 7.6*   < > 10.1* 10.3* 10.1*  HCT 24.6*   < >  23.8*   < > 23.1*   < > 31.1* 31.5* 31.5*  MCV 85.7   < > 82.1  --  83.1  --  85.2 84.9 86.1  PLT 302   < > 338   < > 340   < > 319 345 335   < > = values in this interval not displayed.   Cardiac Enzymes: No results for input(s): CKTOTAL, CKMB, CKMBINDEX, TROPONINI in the last 168 hours. CBG: Recent Labs  Lab 08/25/19 0533 08/25/19 1125 08/25/19 1728 08/26/19 0004 08/26/19 0453  GLUCAP 79 98 86 84 76    Iron Studies:  Recent Labs    08/24/19 0432  IRON 92  TIBC 181*  FERRITIN 476*     Medications: Infusions:   Scheduled Medications: . Chlorhexidine Gluconate Cloth  6 each Topical Daily  . darbepoetin (ARANESP) injection - NON-DIALYSIS  100 mcg Subcutaneous Q Sun-1800  . diltiazem  180 mg Oral Daily  . ezetimibe  10 mg Oral Daily  . FLUoxetine  10 mg Oral Daily  . lamoTRIgine  75 mg Oral BID  . levETIRAcetam  1,500 mg Oral BID  . levothyroxine  137 mcg Oral Q0600  . mouth rinse  15 mL Mouth Rinse BID  .  pantoprazole  40 mg Oral BID  . polyethylene glycol  17 g Oral Daily  . rosuvastatin  40 mg Oral Daily  . senna-docusate  1 tablet Oral BID  . sodium chloride flush  10-40 mL Intracatheter Q12H    have reviewed scheduled and prn medications.

## 2019-08-26 NOTE — Progress Notes (Signed)
All discharge information including follow-up, prescription meds and medication etc  provided to patient's Nurse Sherlon Handing at [facility]. Foley catheter removed. All belonging placed in bag to be sent with patient. No further question ask. Patient currently waiting on PTAR to be transported to facility.

## 2019-08-26 NOTE — Progress Notes (Signed)
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 Patient was admitted to the urology service and taken to the operating room that same day for port site incisional hernia repair. 6/18: Incisional hernia repair.  6/28: Heart rate elevated, EKG showed A. fib with RVR.  Hospitalist consulted.  Spontaneously resolved.   Patient also continued to have worsening renal functions" Renal function is stabilized, followed by nephrology.  Subjective: Resting comfortably.  Alert awake.  Has not needed IV fluids creatinine is improving.      Assessment & Plan:  Small bowel obstruction secondary to incisional hernia status post repair 08/12/2019: Resolved.  Tolerating diet asymptomatic.  AKI superimposed on CKD stage IIIb: Followed by nephrology baseline creatinine 1.5-1.7 presented with 5.8 creatinine: UOP 1350.  Held off on central line and overnight creatinine is downtrending without IV fluids.  Nephrology on board no need for further fluids and patient without uremic symptom BUN/creatinine downtrending and okay for discharge from renal standpoint as per nephrology.  Diltiazem dose lowered.  Follow-up appointment in [place] office per nephro Recent Labs  Lab 08/22/19 0507 08/23/19 0433 08/24/19 0432 08/25/19 0428 08/26/19 0440  BUN 22 22 24* 24* 24*  CREATININE 6.39* 6.56* 6.49* 6.43* 6.14*   PAF W/ RVR: Status post Cardizem  infusion echo with normal systolic function elevated pulmonary pressures VQ scan low probably for PE Dopplers negative. Cont po Cardizem, eliquis.  Discussed with nephrology Dr Jonnie Finner, okay to resume Eliquis and discharge her to skilled nursing facility on the current dose of 5 mg twice daily.  Monitor closely given AKI.  Hypothyroidism: euthyroid,continue levothyroxine.  Normocytic anemia symptomatic anemia needing 2 U PRBC blood transfusion on 6/29 hemoglobin is stable.  Monitor on Eliquis. Recent Labs  Lab 08/22/19 0500 08/23/19 0433 08/24/19 0432 08/25/19 0428 08/26/19 0440  HGB 7.9* 7.6* 10.1* 10.3* 10.1*  HCT 23.8* 23.1* 31.1* 31.5* 31.5*   Seizure disorder on home Keppra and Lamictal.  Continue the same.  Hypertension Home ACE inhibitor Norvasc held, now on Cardizem for A. fib dose was decreased due to borderline low blood pressure.  BP is controlled continue the same.  Morbid obesity with BMI 42.  Will benefit with weight loss and healthy lifestyle.  DVT prophylaxis: Eliquis. Code Status: Full code Family Communication: plan of care discussed with patient at bedside.  Status is: Inpatient Remains inpatient appropriate because:IV treatments appropriate due to intensity of illness or inability to take PO and For ongoing management of acute kidney injury  Dispo: The patient is from: Home              Anticipated d/c is to: snf              Anticipated d/c date is: Today              Patient currently is not medically stable to d/c.  At this point patient has been cleared by nephrology for discharge.  She will need follow-up BMP in 5 to 7 days.    Nutrition:  Diet Order            Diet regular Room service appropriate? Yes; Fluid consistency: Thin  Diet effective now                 Body mass index is 42.37 kg/m.  Consultants:see note  Procedures:see note Microbiology:see note  Medications: Scheduled Meds: . Chlorhexidine Gluconate Cloth  6 each Topical Daily    . darbepoetin (ARANESP) injection - NON-DIALYSIS  100 mcg Subcutaneous Q Sun-1800  . diltiazem  180 mg Oral Daily  . ezetimibe  10 mg Oral Daily  . FLUoxetine  10 mg Oral Daily  . lamoTRIgine  75 mg Oral BID  . levETIRAcetam  1,500 mg Oral BID  . levothyroxine  137 mcg Oral Q0600  . mouth rinse  15 mL Mouth Rinse BID  . pantoprazole  40 mg Oral BID  . polyethylene glycol  17 g Oral Daily  . rosuvastatin  40 mg Oral Daily  . senna-docusate  1 tablet Oral BID  . sodium chloride flush  10-40 mL Intracatheter Q12H   Continuous Infusions:   Antimicrobials: Anti-infectives (From admission, onward)   Start     Dose/Rate Route Frequency Ordered Stop   08/12/19 2300  cefTRIAXone (ROCEPHIN) 2 g in sodium chloride 0.9 % 100 mL IVPB  Status:  Discontinued        2 g 200 mL/hr over 30 Minutes Intravenous Daily at bedtime 08/12/19 2226 08/21/19 1112   08/12/19 1145  cefTRIAXone (ROCEPHIN) 1 g in sodium chloride 0.9 % 100 mL IVPB        1 g 200 mL/hr over 30 Minutes Intravenous  Once 08/12/19 1140 08/12/19 1320       Objective: Vitals: Today's Vitals   08/26/19 0500 08/26/19 0513 08/26/19 0900 08/26/19 1000  BP:  (!) 144/66    Pulse:  79    Resp:  18    Temp:  97.8 F (36.6 C)    TempSrc:  Oral    SpO2:  100%    Weight: 115.5 kg     Height:      PainSc:   0-No pain 0-No pain    Intake/Output Summary (Last 24 hours) at 08/26/2019 1230 Last data filed at 08/26/2019 1000 Gross per 24 hour  Intake 360 ml  Output 1550 ml  Net -1190 ml   Filed Weights   08/24/19 0500 08/25/19 0538 08/26/19 0500  Weight: 112.4 kg 115.6 kg 115.5 kg   Weight change: -0.1 kg   Intake/Output from previous day: 07/01 0701 - 07/02 0700 In: 637.2 [P.O.:460; I.V.:177.2] Out: 1350 [Urine:1350] Intake/Output this shift: Total I/O In: 120 [P.O.:120] Out: 200 [Urine:200]  Examination:  General exam: AAO X3, obese, NAD, weak appearing. HEENT:Oral mucosa moist, Ear/Nose WNL grossly, dentition  normal. Respiratory system: bilaterally clear, no wheezing or crackles,no use of accessory muscle Cardiovascular system: S1 & S2 +, No JVD,. Gastrointestinal system: Abdomen soft, NT,ND, BS+ Nervous System:Alert, awake, moving extremities and grossly nonfocal Extremities: No edema, distal peripheral pulses palpable.  Skin: No rashes,no icterus. MSK: Normal muscle bulk,tone, power    Data Reviewed: I have personally reviewed following labs and imaging studies CBC: Recent Labs  Lab 08/20/19 0454 08/21/19 0512 08/22/19 0500 08/23/19 0433 08/24/19 0432 08/25/19 0428 08/26/19 0440  WBC 14.3*   < > 9.5 8.8 10.2 10.1 9.9  NEUTROABS 10.7*  --   --   --   --   --   --   HGB 7.8*   < >  7.9* 7.6* 10.1* 10.3* 10.1*  HCT 24.6*   < > 23.8* 23.1* 31.1* 31.5* 31.5*  MCV 85.7   < > 82.1 83.1 85.2 84.9 86.1  PLT 302   < > 338 340 319 345 335   < > = values in this interval not displayed.   Basic Metabolic Panel: Recent Labs  Lab 08/22/19 0507 08/23/19 0433 08/24/19 0432 08/25/19 0428 08/26/19 0440  NA 143 140 139 138 139  K 4.3 3.2* 3.4* 3.6 3.3*  CL 103 98 99 99 102  CO2 28 29 29 26 25   GLUCOSE 100* 101* 91 90 91  BUN 22 22 24* 24* 24*  CREATININE 6.39* 6.56* 6.49* 6.43* 6.14*  CALCIUM 7.5* 7.5* 7.8* 7.9* 8.0*  PHOS 2.6 2.8 3.3 3.5 3.2   GFR: Estimated Creatinine Clearance: 11.6 mL/min (A) (by C-G formula based on SCr of 6.14 mg/dL (H)). Liver Function Tests: Recent Labs  Lab 08/22/19 0507 08/23/19 0433 08/24/19 0432 08/25/19 0428 08/26/19 0440  ALBUMIN 1.7* 1.8* 2.0* 2.1* 2.2*   No results for input(s): LIPASE, AMYLASE in the last 168 hours. No results for input(s): AMMONIA in the last 168 hours. Coagulation Profile: No results for input(s): INR, PROTIME in the last 168 hours. Cardiac Enzymes: No results for input(s): CKTOTAL, CKMB, CKMBINDEX, TROPONINI in the last 168 hours. BNP (last 3 results) No results for input(s): PROBNP in the last 8760 hours. HbA1C: No  results for input(s): HGBA1C in the last 72 hours. CBG: Recent Labs  Lab 08/25/19 1125 08/25/19 1728 08/26/19 0004 08/26/19 0453 08/26/19 1151  GLUCAP 98 86 84 76 90   Lipid Profile: No results for input(s): CHOL, HDL, LDLCALC, TRIG, CHOLHDL, LDLDIRECT in the last 72 hours. Thyroid Function Tests: No results for input(s): TSH, T4TOTAL, FREET4, T3FREE, THYROIDAB in the last 72 hours. Anemia Panel: Recent Labs    08/24/19 0432  FERRITIN 476*  TIBC 181*  IRON 92   Sepsis Labs: Recent Labs  Lab 08/20/19 0454  LATICACIDVEN 0.9    Recent Results (from the past 240 hour(s))  Culture, Urine     Status: None   Collection Time: 08/19/19  9:40 AM   Specimen: Urine, Catheterized  Result Value Ref Range Status   Specimen Description   Final    URINE, CATHETERIZED Performed at [facility] [address], [place], [place] [address]    Special Requests   Final    NONE Performed at [facility], [place] [address] [place], [place] [address]    Culture   Final    NO GROWTH Performed at [facility], [place] [address], [place], [place] [address]    Report Status 08/20/2019 FINAL  Final  SARS Coronavirus 2 by RT PCR (hospital order, performed in [facility] hospital lab) Nasopharyngeal Nasopharyngeal Swab     Status: None   Collection Time: 08/24/19 10:29 AM   Specimen: Nasopharyngeal Swab  Result Value Ref Range Status   SARS Coronavirus 2 NEGATIVE NEGATIVE Final    Comment: (NOTE) SARS-CoV-2 target nucleic acids are NOT DETECTED.  The SARS-CoV-2 RNA is generally detectable in upper and lower respiratory specimens during the acute phase of infection. The lowest concentration of SARS-CoV-2 viral copies this assay can detect is 250 copies / mL. A negative result does not preclude SARS-CoV-2 infection and should not be used as the sole basis for treatment or other patient management decisions.  A negative result may occur with improper  specimen collection / handling, submission  of specimen other than nasopharyngeal swab, presence of viral mutation(s) within the areas targeted by this assay, and inadequate number of viral copies (<250 copies / mL). A negative result must be combined with clinical observations, patient history, and epidemiological information.  Fact Sheet for Patients:   StrictlyIdeas.no  Fact Sheet for Healthcare Providers: BankingDealers.co.za  This test is not yet approved or  cleared by the [place] FDA and has been authorized for detection and/or diagnosis of SARS-CoV-2 by FDA under an Emergency Use Authorization (EUA).  This EUA will remain in effect (meaning this test can be used) for the duration of the COVID-19 declaration under Section 564(b)(1) of the Act, 21 U.S.C. section 360bbb-3(b)(1), unless the authorization is terminated or revoked sooner.  Performed at [facility], [place] [address] [place], [place] [address]       Radiology Studies: No results found.   LOS: 14 days   Antonieta Pert, MD [facility]  08/26/2019, 12:30 PM

## 2019-09-01 ENCOUNTER — Telehealth: Payer: Self-pay | Admitting: *Deleted

## 2019-09-01 NOTE — Telephone Encounter (Addendum)
Attempted to contact pt to complete transition of care assessment; no answer; unable to leave message.  Lenor Coffin, RN, BSN, [place] [facility] [place] [phone]

## 2019-09-12 ENCOUNTER — Ambulatory Visit: Payer: Medicare Other | Admitting: Family Medicine

## 2019-09-12 ENCOUNTER — Other Ambulatory Visit: Payer: Self-pay | Admitting: Family Medicine

## 2019-09-29 ENCOUNTER — Telehealth: Payer: Self-pay

## 2019-09-29 NOTE — Telephone Encounter (Signed)
Transition Care Management Follow-up Telephone Call   Date discharged?  was hospitalized then went to a nursing home. Discharged from nursing home 09/28/19              How have you been since you were released from the hospital? been feeling alright   Do you understand why you were in the hospital? Hernia w/ gangrene and blockage   Do you understand the discharge instructions? yes   Where were you discharged to? Nursing home   Items Reviewed:  Medications reviewed: gave additional medications and has someone to help her manage those  Allergies reviewed: yes  Dietary changes reviewed: same as before  Referrals reviewed: no new referrals   Functional Questionnaire:   Activities of Daily Living (ADLs): yes. Has help if it is needed    Any transportation issues/concerns?: someone brings patient to her appointments   Any patient concerns? no   Confirmed importance and date/time of follow-up visits scheduled  Aug 9 at 1pm     Confirmed with patient if condition begins to worsen call PCP or go to the ER.  Patient was given the office number and encouraged to call back with question or concerns. Yes with verbal understanding

## 2019-10-03 ENCOUNTER — Inpatient Hospital Stay: Payer: Medicare Other | Admitting: Family Medicine

## 2019-10-06 ENCOUNTER — Other Ambulatory Visit: Payer: Self-pay

## 2019-10-06 ENCOUNTER — Telehealth: Payer: Self-pay

## 2019-10-06 ENCOUNTER — Ambulatory Visit (INDEPENDENT_AMBULATORY_CARE_PROVIDER_SITE_OTHER): Payer: Medicare Other | Admitting: Family Medicine

## 2019-10-06 ENCOUNTER — Encounter: Payer: Self-pay | Admitting: Family Medicine

## 2019-10-06 VITALS — BP 112/70 | HR 86 | Resp 16 | Ht 65.0 in | Wt 223.0 lb

## 2019-10-06 DIAGNOSIS — Z7689 Persons encountering health services in other specified circumstances: Secondary | ICD-10-CM

## 2019-10-06 DIAGNOSIS — R7989 Other specified abnormal findings of blood chemistry: Secondary | ICD-10-CM

## 2019-10-06 DIAGNOSIS — I10 Essential (primary) hypertension: Secondary | ICD-10-CM

## 2019-10-06 DIAGNOSIS — E785 Hyperlipidemia, unspecified: Secondary | ICD-10-CM | POA: Diagnosis not present

## 2019-10-06 MED ORDER — APIXABAN 5 MG PO TABS
5.0000 mg | ORAL_TABLET | Freq: Two times a day (BID) | ORAL | 3 refills | Status: DC
Start: 2019-10-06 — End: 2020-01-18

## 2019-10-06 MED ORDER — AMLODIPINE BESYLATE 2.5 MG PO TABS: 2.5000 mg | ORAL_TABLET | Freq: Every day | ORAL | 5 refills | Status: DC

## 2019-10-06 MED ORDER — FLUOXETINE HCL (PMDD) 10 MG PO CAPS: ORAL_CAPSULE | ORAL | 5 refills | Status: DC

## 2019-10-06 NOTE — Patient Instructions (Signed)
F/u in office re eval bP and flu vaccine in 5 weeks, call if you need me sooner  STOP loratidine  New lower dose of amlodipine is 2.5 mg one daily, stop amlodipine 5 mg tablet  Labs today, lipid, cmp and EGFR, CBC   Be careful not to fall  Thankful surgery has been successful  Think about what you will eat, plan ahead. Choose " clean, green, fresh or frozen" over canned, processed or packaged foods which are more sugary, salty and fatty. 70 to 75% of food eaten should be vegetables and fruit. Three meals at set times with snacks allowed between meals, but they must be fruit or vegetables. Aim to eat over a 12 hour period , example 7 am to 7 pm, and STOP after  your last meal of the day. Drink water,generally about 64 ounces per day, no other drink is as healthy. Fruit juice is best enjoyed in a healthy way, by EATING the fruit.   Thanks for choosing [facility], we consider it a privelige to serve you.

## 2019-10-06 NOTE — Telephone Encounter (Signed)
Pt needing a new referral for Home health aide to return to service pt in home.

## 2019-10-07 ENCOUNTER — Encounter (HOSPITAL_COMMUNITY): Payer: Self-pay

## 2019-10-07 ENCOUNTER — Other Ambulatory Visit: Payer: Self-pay

## 2019-10-07 ENCOUNTER — Other Ambulatory Visit: Payer: Self-pay | Admitting: Family Medicine

## 2019-10-07 ENCOUNTER — Encounter: Payer: Self-pay | Admitting: Internal Medicine

## 2019-10-07 ENCOUNTER — Encounter (HOSPITAL_COMMUNITY)
Admission: RE | Admit: 2019-10-07 | Discharge: 2019-10-07 | Disposition: A | Discharge status: Home / Self Care | Payer: Medicare Other | Source: Ambulatory Visit | Attending: Nephrology | Admitting: Nephrology

## 2019-10-07 DIAGNOSIS — R748 Abnormal levels of other serum enzymes: Secondary | ICD-10-CM

## 2019-10-07 DIAGNOSIS — F70 Mild intellectual disabilities: Secondary | ICD-10-CM

## 2019-10-07 DIAGNOSIS — Z780 Asymptomatic menopausal state: Secondary | ICD-10-CM

## 2019-10-07 DIAGNOSIS — D509 Iron deficiency anemia, unspecified: Secondary | ICD-10-CM | POA: Insufficient documentation

## 2019-10-07 DIAGNOSIS — N184 Chronic kidney disease, stage 4 (severe): Secondary | ICD-10-CM | POA: Insufficient documentation

## 2019-10-07 DIAGNOSIS — R7401 Elevation of levels of liver transaminase levels: Secondary | ICD-10-CM

## 2019-10-07 HISTORY — DX: Anemia, unspecified: D64.9

## 2019-10-07 MED ORDER — SODIUM CHLORIDE 0.9 % IV SOLN: Freq: Once | INTRAVENOUS | Status: AC

## 2019-10-07 MED ORDER — SODIUM CHLORIDE 0.9 % IV SOLN
510.0000 mg | Freq: Once | INTRAVENOUS | Status: AC
  Administered 2019-10-07: 510 mg via INTRAVENOUS
  Filled 2019-10-07: qty 510

## 2019-10-07 NOTE — Telephone Encounter (Signed)
Referral faxed to [facility]

## 2019-10-09 DIAGNOSIS — Z7689 Persons encountering health services in other specified circumstances: Secondary | ICD-10-CM | POA: Insufficient documentation

## 2019-10-09 NOTE — Progress Notes (Signed)
   [name]     MRN: [id number]      DOB: 04-27-1955   HPI [name] is here for follow up of recent hospitalization for nephrectomy followed by rehab placement at [facility], discharged on 09/29/2019. C/O feeling weak, unable to cook for herself but has in home help daily and also her  Children help her Denies fever , chills or uncontrolled pain, voiding urine No falls but feels light headed when she raises up from a seated position Hospital course is reviewed and discharge sheet from [place] Denies recent fever or chills. Denies sinus pressure, nasal congestion, ear pain or sore throat. Denies chest congestion, productive cough or wheezing. Denies chest pains, palpitations and leg swelling Denies abdominal pain, nausea, vomiting,diarrhea or constipation.   Denies dysuria, frequency, hesitancy or incontinence. C/o  joint pain, swelling and limitation in mobility. Denies headaches, seizures, numbness, or tingling. Denies depression, anxiety or insomnia. Denies skin break down or rash.   PE  BP 112/70   Pulse 86   Resp 16   Ht 5\' 5"  (1.651 m)   Wt 223 lb 0.6 oz (101.2 kg)   SpO2 95%   BMI 37.12 kg/m   Patient slightly sleepy and oriented and in no cardiopulmonary distress.  HEENT: No facial asymmetry, EOMI,     Neck supple .  Chest: Clear to auscultation bilaterally.  CVS: S1, S2 no murmurs, no S3.Regular rate.  ABD: Soft no localized tenderness or guarding.   Ext: No edema  MS: Adequate though reduced  ROM spine, shoulders, hips and knees.  Skin: Intact, no ulcerations or rash noted.  Psych: Good eye contact, normal affect.  not anxious or depressed appearing.  CNS: CN 2-12 intact, power,  normal throughout.no focal deficits noted.   Assessment & Plan  Encounter for support and coordination of transition of care Patient in for follow up of recent hospitalization. Discharge summary, and laboratory and radiology data are reviewed, and any questions or  concerns  are discussed. Specific issues requiring follow up are specifically addressed.   Essential hypertension Over corrected, reduce amlodipine dose and re assess

## 2019-10-09 NOTE — Assessment & Plan Note (Signed)
Patient in for follow up of recent hospitalization. Discharge summary, and laboratory and radiology data are reviewed, and any questions or concerns  are discussed. Specific issues requiring follow up are specifically addressed.  

## 2019-10-09 NOTE — Assessment & Plan Note (Signed)
Over corrected, reduce amlodipine dose and re assess

## 2019-10-10 LAB — COMPLETE METABOLIC PANEL WITH GFR
AG Ratio: 0.9 (calc) — ABNORMAL LOW (ref 1.0–2.5)
ALT: 139 U/L — ABNORMAL HIGH (ref 6–29)
AST: 185 U/L — ABNORMAL HIGH (ref 10–35)
Albumin: 3.7 g/dL (ref 3.6–5.1)
Alkaline phosphatase (APISO): 97 U/L (ref 37–153)
BUN/Creatinine Ratio: 10 (calc) (ref 6–22)
BUN: 29 mg/dL — ABNORMAL HIGH (ref 7–25)
CO2: 22 mmol/L (ref 20–32)
Calcium: 10.2 mg/dL (ref 8.6–10.4)
Chloride: 109 mmol/L (ref 98–110)
Creat: 2.96 mg/dL — ABNORMAL HIGH (ref 0.50–0.99)
GFR, Est African American: 18 mL/min/{1.73_m2} — ABNORMAL LOW (ref >=60)
GFR, Est Non African American: 16 mL/min/{1.73_m2} — ABNORMAL LOW (ref >=60)
Globulin: 4 g/dL (calc) — ABNORMAL HIGH (ref 1.9–3.7)
Glucose, Bld: 96 mg/dL (ref 65–99)
Potassium: 3.5 mmol/L (ref 3.5–5.3)
Sodium: 143 mmol/L (ref 135–146)
Total Bilirubin: 0.6 mg/dL (ref 0.2–1.2)
Total Protein: 7.7 g/dL (ref 6.1–8.1)

## 2019-10-10 LAB — CBC
HCT: 32.6 % — ABNORMAL LOW (ref 35.0–45.0)
Hemoglobin: 10.6 g/dL — ABNORMAL LOW (ref 11.7–15.5)
MCH: 28 pg (ref 27.0–33.0)
MCHC: 32.5 g/dL (ref 32.0–36.0)
MCV: 86 fL (ref 80.0–100.0)
MPV: 10.9 fL (ref 7.5–12.5)
Platelets: 233 10*3/uL (ref 140–400)
RBC: 3.79 10*6/uL — ABNORMAL LOW (ref 3.80–5.10)
RDW: 17.3 % — ABNORMAL HIGH (ref 11.0–15.0)
WBC: 6.1 10*3/uL (ref 3.8–10.8)

## 2019-10-10 LAB — ACUTE HEP PANEL AND HEP B SURFACE AB
HEPATITIS C ANTIBODY REFILL$(REFL): NONREACTIVE
Hep A IgM: NONREACTIVE
Hep B C IgM: NONREACTIVE
Hepatitis B Surface Ag: NONREACTIVE
SIGNAL TO CUT-OFF: 0.2 (ref <1.00)

## 2019-10-10 LAB — LIPID PANEL
Cholesterol: 140 mg/dL (ref <200)
HDL: 42 mg/dL — ABNORMAL LOW (ref >=50)
LDL Cholesterol (Calc): 80 mg/dL (calc)
Non-HDL Cholesterol (Calc): 98 mg/dL (calc) (ref <130)
Total CHOL/HDL Ratio: 3.3 (calc) (ref <5.0)
Triglycerides: 98 mg/dL (ref <150)

## 2019-10-10 LAB — TEST AUTHORIZATION

## 2019-10-10 LAB — REFLEX TIQ

## 2019-10-12 ENCOUNTER — Other Ambulatory Visit: Payer: Self-pay

## 2019-10-12 ENCOUNTER — Ambulatory Visit (HOSPITAL_COMMUNITY)
Admission: RE | Admit: 2019-10-12 | Discharge: 2019-10-12 | Disposition: A | Discharge status: Home / Self Care | Payer: Medicare Other | Source: Ambulatory Visit | Attending: Family Medicine | Admitting: Family Medicine

## 2019-10-12 DIAGNOSIS — R7401 Elevation of levels of liver transaminase levels: Secondary | ICD-10-CM | POA: Insufficient documentation

## 2019-10-13 ENCOUNTER — Telehealth: Payer: Self-pay

## 2019-10-13 ENCOUNTER — Other Ambulatory Visit: Payer: Self-pay | Admitting: Family Medicine

## 2019-10-13 DIAGNOSIS — R932 Abnormal findings on diagnostic imaging of liver and biliary tract: Secondary | ICD-10-CM

## 2019-10-13 NOTE — Telephone Encounter (Signed)
Returned call- no answer. Was questioning why pt was on eliquis twice a day. I see that it was sent at 10/06/19 visit. Please advise

## 2019-10-13 NOTE — Telephone Encounter (Signed)
Georgina Peer with [facility] called wanted to discuss why the pt is on Eliquis 2x a day and wanted PCP to be aware that another provider([facility]) has the pt on potassium.

## 2019-10-13 NOTE — Progress Notes (Signed)
amnb gi

## 2019-10-13 NOTE — Telephone Encounter (Signed)
Has new dx of a fib from 08/2019 discharge summary

## 2019-10-14 ENCOUNTER — Emergency Department (HOSPITAL_COMMUNITY): Payer: Medicare Other

## 2019-10-14 ENCOUNTER — Other Ambulatory Visit: Payer: Self-pay | Admitting: Family Medicine

## 2019-10-14 ENCOUNTER — Encounter (HOSPITAL_COMMUNITY): Payer: Self-pay

## 2019-10-14 ENCOUNTER — Emergency Department (HOSPITAL_COMMUNITY)
Admission: EM | Admit: 2019-10-14 | Discharge: 2019-10-15 | Disposition: A | Discharge status: Home / Self Care | Payer: Medicare Other | Attending: Emergency Medicine | Admitting: Emergency Medicine

## 2019-10-14 ENCOUNTER — Encounter (HOSPITAL_COMMUNITY)
Admission: RE | Admit: 2019-10-14 | Discharge: 2019-10-14 | Disposition: A | Discharge status: Home / Self Care | Payer: Medicare Other | Source: Ambulatory Visit | Attending: Nephrology | Admitting: Nephrology

## 2019-10-14 ENCOUNTER — Other Ambulatory Visit: Payer: Self-pay

## 2019-10-14 DIAGNOSIS — Y92002 Bathroom of unspecified non-institutional (private) residence single-family (private) house as the place of occurrence of the external cause: Secondary | ICD-10-CM | POA: Insufficient documentation

## 2019-10-14 DIAGNOSIS — N183 Chronic kidney disease, stage 3 unspecified: Secondary | ICD-10-CM | POA: Insufficient documentation

## 2019-10-14 DIAGNOSIS — F70 Mild intellectual disabilities: Secondary | ICD-10-CM

## 2019-10-14 DIAGNOSIS — I4891 Unspecified atrial fibrillation: Secondary | ICD-10-CM

## 2019-10-14 DIAGNOSIS — E039 Hypothyroidism, unspecified: Secondary | ICD-10-CM | POA: Insufficient documentation

## 2019-10-14 DIAGNOSIS — W133XXA Fall through floor, initial encounter: Secondary | ICD-10-CM | POA: Diagnosis not present

## 2019-10-14 DIAGNOSIS — Z79899 Other long term (current) drug therapy: Secondary | ICD-10-CM | POA: Insufficient documentation

## 2019-10-14 DIAGNOSIS — Z79890 Hormone replacement therapy (postmenopausal): Secondary | ICD-10-CM | POA: Diagnosis not present

## 2019-10-14 DIAGNOSIS — I129 Hypertensive chronic kidney disease with stage 1 through stage 4 chronic kidney disease, or unspecified chronic kidney disease: Secondary | ICD-10-CM | POA: Insufficient documentation

## 2019-10-14 DIAGNOSIS — Y999 Unspecified external cause status: Secondary | ICD-10-CM | POA: Diagnosis not present

## 2019-10-14 DIAGNOSIS — Y939 Activity, unspecified: Secondary | ICD-10-CM | POA: Insufficient documentation

## 2019-10-14 DIAGNOSIS — S93402A Sprain of unspecified ligament of left ankle, initial encounter: Secondary | ICD-10-CM | POA: Insufficient documentation

## 2019-10-14 DIAGNOSIS — S99912A Unspecified injury of left ankle, initial encounter: Secondary | ICD-10-CM | POA: Diagnosis present

## 2019-10-14 DIAGNOSIS — S93422A Sprain of deltoid ligament of left ankle, initial encounter: Secondary | ICD-10-CM

## 2019-10-14 DIAGNOSIS — D509 Iron deficiency anemia, unspecified: Secondary | ICD-10-CM | POA: Diagnosis not present

## 2019-10-14 MED ORDER — SODIUM CHLORIDE 0.9 % IV SOLN
510.0000 mg | Freq: Once | INTRAVENOUS | Status: AC
  Administered 2019-10-14: 510 mg via INTRAVENOUS
  Filled 2019-10-14: qty 510

## 2019-10-14 MED ORDER — SODIUM CHLORIDE 0.9 % IV SOLN: Freq: Once | INTRAVENOUS | Status: AC

## 2019-10-14 NOTE — ED Notes (Signed)
Report to Ginger, RN

## 2019-10-14 NOTE — Telephone Encounter (Signed)
Lydia aware

## 2019-10-14 NOTE — Progress Notes (Signed)
mb card

## 2019-10-14 NOTE — ED Notes (Signed)
Pt reports she has taken no OTC meds for her pain today

## 2019-10-14 NOTE — Discharge Instructions (Addendum)
Wear the ankle support until your ankle is totally healed.  Elevate your leg, use ice packs to get the swelling down and to help with pain.  You can take ibuprofen 400 mg plus acetaminophen 650 mg 3 times a day as needed for pain.  If you are improving over the next week, call Dr. Ruthe Mannan office to get a follow-up appointment to have him evaluate your ankle.  He is a orthopedist or a bone specialist.

## 2019-10-14 NOTE — Telephone Encounter (Signed)
Wants to know if you have referred her to cardiology? I don't see a referral.

## 2019-10-14 NOTE — ED Notes (Signed)
Pt felling getting up from couch today   Now with L ankle pain   Here for eval

## 2019-10-14 NOTE — ED Provider Notes (Signed)
[facility] EMERGENCY DEPARTMENT Provider Note   CSN: [id number] Arrival date & time: 10/14/19  1824   Time seen 11:11 PM  History Chief Complaint  Patient presents with  . Fall    [name] is a 64 y.o. female.  HPI   Patient states about 11 AM this morning she was getting out of bed to go to the bathroom and she slipped on her hard wood floors and twisted her left ankle.  She states she was able to get up and to walk on it however got worse during the day.  She did not feel or hear a pop or snap.  She states she has not had problems with her ankle before.  She denies any other injury.  PCP [name], MD Ortho none  Past Medical History:  Diagnosis Date  . Anemia   . Depression   . Dyspnea   . Fractures   . Hyperlipidemia   . Hypertension   . Mental retardation, mild (I.Q. 50-70)    lives alone and cares for self has a nurse aide comes once a day   . Obesity   . Pneumonia   . PONV (postoperative nausea and vomiting)   . Seizure ([place])    11/17/2012 "still having them; "I believe their from a tumor in my head" ((11/17/2012  . Seizure disorder ([place])   . Thyroid cancer [facility])     Patient Active Problem List   Diagnosis Date Noted  . Encounter for support and coordination of transition of care 10/09/2019  . Atrial fibrillation with RVR ([place]) 08/14/2019  . Hypothyroidism   . Incisional hernia with gangrene and obstruction 08/13/2019  . Renal mass 08/05/2019  . Dysuria 05/10/2019  . Other specified disorders of kidney and ureter 05/10/2019  . Renal mass, right 05/10/2019  . Encounter for examination following treatment at hospital 05/10/2019  . Encounter for vaccination 05/10/2019  . Tachycardia 07/04/2018  . Acute renal failure superimposed on stage 3 chronic kidney disease ([place])   . Depression, major, single episode, mild ([place]) 03/21/2018  . CKD (chronic kidney disease) stage 3, GFR 30-59 ml/min 04/03/2016  . GERD (gastroesophageal reflux disease)  08/23/2015  . Osteoarthritis of right knee 08/15/2014  . 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 Reduced vision 05/03/2013  . 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 Vitamin D deficiency 08/16/2009  . POLYNEUROPATHY 04/10/2009  . Hyperlipidemia LDL goal <100 07/15/2007  . Morbid obesity ([place]) 07/15/2007  . 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 Essential hypertension 07/15/2007  . Seizures ([place]) 07/15/2007    Past Surgical History:  Procedure Laterality Date  . ABDOMINAL HYSTERECTOMY  2001  . BREAST LUMPECTOMY  1996   right   . COLONOSCOPY N/A 05/16/2016   Procedure: COLONOSCOPY;  Surgeon: Danie Binder, MD;  [facility];  Service: Endoscopy;  Laterality: N/A;  830   . INGUINAL HERNIA REPAIR N/A 08/12/2019   Procedure: laparoscopic incisional hernia repair;  Surgeon: Lucas Mallow, MD;  Location: [facility];  Service: Urology;  Laterality: N/A;  . ROBOTIC ASSITED PARTIAL NEPHRECTOMY Right 08/05/2019   Procedure: XI ROBOTIC ASSITED PARTIAL NEPHRECTOMY WITH INTRAOPERATIVE ULTRASOUND;  Surgeon: [name], MD;  Location: [facility];  Service: Urology;  Laterality: Right;  3 HRS  . THYROIDECTOMY Bilateral 11/17/2012   Procedure: TOTAL THYROIDECTOMY;  Surgeon: Ascencion Dike, MD;  Location: [place];  Service: ENT;  Laterality: Bilateral;  . TOTAL THYROIDECTOMY Bilateral 11/17/2012  . TUBAL LIGATION  OB History   No obstetric history on file.     Family History  Problem Relation Age of Onset  . Diabetes Mother     Social History   Tobacco Use  . Smoking status: Never Smoker  . Smokeless tobacco: Never Used  Vaping Use  . Vaping Use: Never used  Substance Use Topics  . Alcohol use: No  . Drug use: No    Home Medications Prior to Admission medications   Medication Sig Start Date End Date Taking? Authorizing Provider  acetaminophen (TYLENOL) 500 MG tablet Take 1 tablet (500 mg total) by mouth 2 (two) times daily. Patient taking differently:  Take 500 mg by mouth 2 (two) times daily as needed for mild pain or headache.  08/15/14   [name], MD  amLODipine (NORVASC) 2.5 MG tablet Take 1 tablet (2.5 mg total) by mouth daily. 10/06/19   [name], MD  apixaban (ELIQUIS) 5 MG TABS tablet Take 1 tablet (5 mg total) by mouth 2 (two) times daily. 10/06/19   [name], MD  benazepril (LOTENSIN) 20 MG tablet Take 20 mg by mouth daily. 09/12/19   [provider]  calcitRIOL (ROCALTROL) 0.25 MCG capsule Take 0.25 mcg by mouth daily. \    [provider]  diltiazem (CARDIZEM CD) 180 MG 24 hr capsule Take 1 capsule (180 mg total) by mouth daily. 08/27/19   Antonieta Pert, MD  ezetimibe (ZETIA) 10 MG tablet Take 1 tablet (10 mg total) by mouth daily. 04/04/19   [name], MD  FLUoxetine (PROZAC) 10 MG capsule Take 1 capsule (10 mg total) by mouth daily. 04/04/19   [name], MD  Fluoxetine HCl, PMDD, 10 MG CAPS Take one capsule by mouth once daily 10/06/19   [name], MD  lamoTRIgine (LAMICTAL) 150 MG tablet Take 1 tablet (150 mg total) by mouth daily. 10/06/19   [name], MD  levETIRAcetam (KEPPRA) 750 MG tablet Two tablets twice daily Patient taking differently: Take 1,500 mg by mouth 2 (two) times daily.  04/28/17   [name], MD  levothyroxine (SYNTHROID) 137 MCG tablet Take 1 tablet (137 mcg total) by mouth daily before breakfast. 05/09/19   Nida, Marella Chimes, MD  pantoprazole (PROTONIX) 40 MG tablet Take 1 tablet (40 mg total) by mouth 2 (two) times daily. 08/26/19   Antonieta Pert, MD  potassium chloride SA (KLOR-CON) 20 MEQ tablet Take by mouth. One time daily 10/05/19 10/04/20  [provider]    Allergies    Simvastatin  Review of Systems   Review of Systems  All other systems reviewed and are negative.   Physical Exam Updated Vital Signs BP (!) 125/58 (BP Location: Right Arm)   Pulse 71   Temp (!) 97.5 F (36.4 C) (Oral)   Resp 16   Ht 5'  5" (1.651 m)   Wt 104.3 kg   SpO2 98%   BMI 38.27 kg/m   Physical Exam Vitals and nursing note reviewed.  Constitutional:      Appearance: Normal appearance. She is obese.  HENT:     Head: Normocephalic.  Eyes:     Extraocular Movements: Extraocular movements intact.     Conjunctiva/sclera: Conjunctivae normal.  Cardiovascular:     Rate and Rhythm: Normal rate.  Pulmonary:     Effort: Pulmonary effort is normal. No respiratory distress.  Musculoskeletal:     Cervical back: Normal range of motion.     Comments: Patient is  nontender to palpation in her left knee or her left lower leg.  She has mild tenderness over the medial malleolus but she is most tender around the lateral malleolus.  Her foot is nontender.  There is no gross deformity seen.  There may be some mild swelling.  Skin:    General: Skin is warm and dry.     Findings: No bruising or erythema.  Neurological:     General: No focal deficit present.     Mental Status: She is alert.     Cranial Nerves: No cranial nerve deficit.  Psychiatric:        Mood and Affect: Mood normal.        Behavior: Behavior normal.        Thought Content: Thought content normal.     ED Results / Procedures / Treatments   Labs (all labs ordered are listed, but only abnormal results are displayed) Labs Reviewed - No data to display  EKG None  Radiology DG Ankle Complete Left  Result Date: 10/14/2019 CLINICAL DATA:  [place], left ankle pain EXAM: LEFT ANKLE COMPLETE - 3+ VIEW COMPARISON:  05/20/2017 FINDINGS: Frontal, oblique, and lateral views of the left ankle are obtained. No fracture, subluxation, or dislocation. Joint spaces are well preserved. There is mild diffuse soft tissue edema. IMPRESSION: 1. Mild edema.  No acute fracture. Electronically Signed   By: Randa Ngo M.D.   On: 10/14/2019 21:07    Procedures Procedures (including critical care time)  Medications Ordered in ED Medications - No data to display  ED Course    I have reviewed the triage vital signs and the nursing notes.  Pertinent labs & imaging results that were available during my care of the patient were reviewed by me and considered in my medical decision making (see chart for details).    MDM Rules/Calculators/A&P                           Patient was placed in an ASO.  She did not feel like she needed crutches.  We discussed elevating her leg, ice packs, she can take ibuprofen and acetaminophen for pain.  She should follow-up with orthopedics if not improving over the next week to 10 days.   Final Clinical Impression(s) / ED Diagnoses Final diagnoses:  Sprain of left medial ankle joint, initial encounter    Rx / DC Orders ED Discharge Orders    None    OTC ibuprofen and acetaminophen  Plan discharge  Rolland Porter, MD, Barbette Or, MD 10/14/19 2337

## 2019-10-14 NOTE — ED Notes (Signed)
Requested pt be seen as she has resulted   Both providers are behind and cannot see at this time

## 2019-10-14 NOTE — Telephone Encounter (Signed)
This was addressed in an 8/13 msg with daughter, she was told to stop the crestor, you may review entire message with [name] also , thanks

## 2019-10-14 NOTE — Telephone Encounter (Signed)
I jut put in referral, thanks

## 2019-10-14 NOTE — ED Triage Notes (Signed)
Pt fell today around noon, was trying to get up from the couch and fell onto her left leg, having pain in left ankle

## 2019-10-14 NOTE — Telephone Encounter (Signed)
[name] also wants to know with her liver enz up if you want her to stay on the crestor?

## 2019-10-19 ENCOUNTER — Other Ambulatory Visit: Payer: Self-pay | Admitting: Family Medicine

## 2019-10-27 ENCOUNTER — Encounter: Payer: Self-pay | Admitting: Orthopaedic Surgery

## 2019-10-27 ENCOUNTER — Other Ambulatory Visit: Payer: Self-pay

## 2019-10-27 ENCOUNTER — Ambulatory Visit (INDEPENDENT_AMBULATORY_CARE_PROVIDER_SITE_OTHER): Payer: Medicare Other | Admitting: Orthopaedic Surgery

## 2019-10-27 VITALS — BP 116/74 | HR 90 | Ht 65.0 in | Wt 219.0 lb

## 2019-10-27 DIAGNOSIS — S96912A Strain of unspecified muscle and tendon at ankle and foot level, left foot, initial encounter: Secondary | ICD-10-CM | POA: Diagnosis not present

## 2019-10-27 NOTE — Progress Notes (Signed)
Subjective:    Patient ID: [name], female    DOB: Mar 17, 1955, 64 y.o.   MRN: [id number]  HPI She twisted her left foot and ankle on 10-14-2019 while getting out of bed. She had pain and swelling.  She went to the ER and was seen.  X-rays were negative.  I have reviewed the notes.  I have independently reviewed and interpreted x-rays of this patient done at another site by another physician or qualified health professional.  She has used ice and the brace given her.  She stopped the brace two days ago as she felt better.  She has some lateral pain now and slight swelling but she is walking well.  She has no redness.   Review of Systems  Constitutional: Positive for activity change.  Musculoskeletal: Positive for arthralgias, gait problem and joint swelling.  All other systems reviewed and are negative.  For Review of Systems, all other systems reviewed and are negative.  The following is a summary of the past history medically, past history surgically, known current medicines, social history and family history.  This information is gathered electronically by the computer from prior information and documentation.  I review this each visit and have found including this information at this point in the chart is beneficial and informative.   Past Medical History:  Diagnosis Date  . Anemia   . Depression   . Dyspnea   . Fractures   . Hyperlipidemia   . Hypertension   . Mental retardation, mild (I.Q. 50-70)    lives alone and cares for self has a nurse aide comes once a day   . Obesity   . Pneumonia   . PONV (postoperative nausea and vomiting)   . Seizure ([place])    11/17/2012 "still having them; "I believe their from a tumor in my head" ((11/17/2012  . Seizure disorder ([place])   . Thyroid cancer [facility])     Past Surgical History:  Procedure Laterality Date  . ABDOMINAL HYSTERECTOMY  2001  . BREAST LUMPECTOMY  1996   right   . COLONOSCOPY N/A 05/16/2016   Procedure:  COLONOSCOPY;  Surgeon: Danie Binder, MD;  [facility];  Service: Endoscopy;  Laterality: N/A;  830   . INGUINAL HERNIA REPAIR N/A 08/12/2019   Procedure: laparoscopic incisional hernia repair;  Surgeon: Lucas Mallow, MD;  [facility];  Service: Urology;  Laterality: N/A;  . ROBOTIC ASSITED PARTIAL NEPHRECTOMY Right 08/05/2019   Procedure: XI ROBOTIC ASSITED PARTIAL NEPHRECTOMY WITH INTRAOPERATIVE ULTRASOUND;  Surgeon: [name], MD;  [facility];  Service: Urology;  Laterality: Right;  3 HRS  . THYROIDECTOMY Bilateral 11/17/2012   Procedure: TOTAL THYROIDECTOMY;  Surgeon: Ascencion Dike, MD;  Location: [place];  Service: ENT;  Laterality: Bilateral;  . TOTAL THYROIDECTOMY Bilateral 11/17/2012  . TUBAL LIGATION      Current Outpatient Medications on File Prior to Visit  Medication Sig Dispense Refill  . apixaban (ELIQUIS) 5 MG TABS tablet Take 1 tablet (5 mg total) by mouth 2 (two) times daily. 60 tablet 3  . acetaminophen (TYLENOL) 500 MG tablet Take 1 tablet (500 mg total) by mouth 2 (two) times daily. (Patient taking differently: Take 500 mg by mouth 2 (two) times daily as needed for mild pain or headache. ) 60 tablet 5  . amLODipine (NORVASC) 2.5 MG tablet Take 1 tablet (2.5 mg total) by mouth daily. 30 tablet 5  . benazepril (LOTENSIN) 20 MG tablet  Take 20 mg by mouth daily.    . calcitRIOL (ROCALTROL) 0.25 MCG capsule Take 0.25 mcg by mouth daily. \    . diltiazem (CARDIZEM CD) 180 MG 24 hr capsule Take 1 capsule (180 mg total) by mouth daily.    Marland Kitchen diltiazem (TIAZAC) 180 MG 24 hr capsule Take by mouth.    . ezetimibe (ZETIA) 10 MG tablet Take 1 tablet (10 mg total) by mouth daily. 30 tablet 5  . FLUoxetine (PROZAC) 10 MG capsule Take 1 capsule (10 mg total) by mouth daily. 30 capsule 5  . Fluoxetine HCl, PMDD, 10 MG CAPS Take one capsule by mouth once daily 30 capsule 5  . lamoTRIgine (LAMICTAL) 150 MG tablet Take 1 tablet (150 mg total) by mouth daily. 30  tablet 3  . levETIRAcetam (KEPPRA) 750 MG tablet Two tablets twice daily (Patient taking differently: Take 1,500 mg by mouth 2 (two) times daily. ) 120 tablet 3  . levothyroxine (SYNTHROID) 137 MCG tablet Take 1 tablet (137 mcg total) by mouth daily before breakfast. 90 tablet 1  . omeprazole (PRILOSEC) 20 MG capsule Take 20 mg by mouth daily.    . pantoprazole (PROTONIX) 40 MG tablet Take 1 tablet (40 mg total) by mouth 2 (two) times daily.    . potassium chloride SA (KLOR-CON) 20 MEQ tablet Take by mouth. One time daily     No current facility-administered medications on file prior to visit.    Social History   Socioeconomic History  . Marital status: Single    Spouse name: Not on file  . Number of children: 2  . Years of education: Not on file  . Highest education level: Not on file  Occupational History  . Occupation: disabled   Tobacco Use  . Smoking status: Never Smoker  . Smokeless tobacco: Never Used  Vaping Use  . Vaping Use: Never used  Substance and Sexual Activity  . Alcohol use: No  . Drug use: No  . Sexual activity: Never  Other Topics Concern  . Not on file  Social History Narrative  . Not on file   Social Determinants of Health   Financial Resource Strain:   . Difficulty of Paying Living Expenses: Not on file  Food Insecurity:   . Worried About Charity fundraiser in the Last Year: Not on file  . Ran Out of Food in the Last Year: Not on file  Transportation Needs:   . Lack of Transportation (Medical): Not on file  . Lack of Transportation (Non-Medical): Not on file  Physical Activity:   . Days of Exercise per Week: Not on file  . Minutes of Exercise per Session: Not on file  Stress:   . Feeling of Stress : Not on file  Social Connections:   . Frequency of Communication with Friends and Family: Not on file  . Frequency of Social Gatherings with Friends and Family: Not on file  . Attends Religious Services: Not on file  . Active Member of Clubs or  Organizations: Not on file  . Attends Archivist Meetings: Not on file  . Marital Status: Not on file  Intimate Partner Violence:   . Fear of Current or Ex-Partner: Not on file  . Emotionally Abused: Not on file  . Physically Abused: Not on file  . Sexually Abused: Not on file    Family History  Problem Relation Age of Onset  . Diabetes Mother     BP 116/74   Pulse 90  Ht 5\' 5"  (1.651 m)   Wt 219 lb (99.3 kg)   BMI 36.44 kg/m   Body mass index is 36.44 kg/m.      Objective:   Physical Exam Vitals and nursing note reviewed.  Constitutional:      Appearance: She is well-developed.  HENT:     Head: Normocephalic and atraumatic.  Eyes:     Conjunctiva/sclera: Conjunctivae normal.     Pupils: Pupils are equal, round, and reactive to light.  Cardiovascular:     Rate and Rhythm: Normal rate and regular rhythm.  Pulmonary:     Effort: Pulmonary effort is normal.  Abdominal:     Palpations: Abdomen is soft.  Musculoskeletal:     Cervical back: Normal range of motion and neck supple.       Feet:  Skin:    General: Skin is warm and dry.  Neurological:     Mental Status: She is alert and oriented to person, place, and time.     Cranial Nerves: No cranial nerve deficit.     Motor: No abnormal muscle tone.     Coordination: Coordination normal.     Deep Tendon Reflexes: Reflexes are normal and symmetric. Reflexes normal.  Psychiatric:        Behavior: Behavior normal.        Thought Content: Thought content normal.        Judgment: Judgment normal.           Assessment & Plan:   Encounter Diagnosis  Name Primary?  . Strain of left ankle and foot, initial encounter Yes   She is improving.  I told her to continue the ice and brace as needed.  I will see her as needed.   This should continue to gradually improve.  Call if any problem.  Precautions discussed.   Electronically Signed Sanjuana Kava, MD 9/2/20218:34 AM

## 2019-11-01 ENCOUNTER — Other Ambulatory Visit: Payer: Self-pay | Admitting: Family Medicine

## 2019-11-04 LAB — T4, FREE: Free T4: 1.9 ng/dL — ABNORMAL HIGH (ref 0.8–1.8)

## 2019-11-04 LAB — THYROGLOBULIN LEVEL: Thyroglobulin: 0.3 ng/mL — ABNORMAL LOW

## 2019-11-04 LAB — THYROGLOBULIN ANTIBODY: Thyroglobulin Ab: <1 IU/mL (ref <=1)

## 2019-11-04 LAB — TSH: TSH: 0.19 mIU/L — ABNORMAL LOW (ref 0.40–4.50)

## 2019-11-07 ENCOUNTER — Ambulatory Visit (HOSPITAL_COMMUNITY)
Admission: RE | Admit: 2019-11-07 | Discharge: 2019-11-07 | Disposition: A | Discharge status: Home / Self Care | Payer: Medicare Other | Source: Ambulatory Visit | Attending: "Endocrinology | Admitting: "Endocrinology

## 2019-11-07 ENCOUNTER — Ambulatory Visit (HOSPITAL_COMMUNITY)
Admission: RE | Admit: 2019-11-07 | Discharge: 2019-11-07 | Disposition: A | Discharge status: Home / Self Care | Payer: Medicare Other | Source: Ambulatory Visit | Attending: Family Medicine | Admitting: Family Medicine

## 2019-11-07 ENCOUNTER — Other Ambulatory Visit: Payer: Self-pay

## 2019-11-07 DIAGNOSIS — Z780 Asymptomatic menopausal state: Secondary | ICD-10-CM | POA: Insufficient documentation

## 2019-11-07 DIAGNOSIS — C73 Malignant neoplasm of thyroid gland: Secondary | ICD-10-CM | POA: Insufficient documentation

## 2019-11-07 DIAGNOSIS — E890 Postprocedural hypothyroidism: Secondary | ICD-10-CM | POA: Diagnosis not present

## 2019-11-09 ENCOUNTER — Other Ambulatory Visit: Payer: Self-pay

## 2019-11-09 ENCOUNTER — Encounter: Payer: Self-pay | Admitting: "Endocrinology

## 2019-11-09 ENCOUNTER — Ambulatory Visit (INDEPENDENT_AMBULATORY_CARE_PROVIDER_SITE_OTHER): Payer: Medicare Other | Admitting: "Endocrinology

## 2019-11-09 VITALS — BP 110/76 | HR 80 | Ht 65.0 in | Wt 220.4 lb

## 2019-11-09 DIAGNOSIS — C73 Malignant neoplasm of thyroid gland: Secondary | ICD-10-CM | POA: Diagnosis not present

## 2019-11-09 DIAGNOSIS — E890 Postprocedural hypothyroidism: Secondary | ICD-10-CM | POA: Diagnosis not present

## 2019-11-09 MED ORDER — LEVOTHYROXINE SODIUM 125 MCG PO TABS
125.0000 ug | ORAL_TABLET | Freq: Every day | ORAL | 1 refills | Status: DC
Start: 2019-11-09 — End: 2020-04-16

## 2019-11-09 NOTE — Progress Notes (Signed)
11/09/2019                                Endocrinology Telehealth Visit Follow up Note -During COVID -19 Pandemic  I connected with [name] on 11/09/2019   by telephone and verified that I am speaking with the correct person using two identifiers. [name], Mar 31, 1955. she has verbally consented to this visit. All issues noted in this document were discussed and addressed. The format was not optimal for physical exam.    Subjective:    Patient ID: [name], female    DOB: 11-19-1955,    Past Medical History:  Diagnosis Date  . Anemia   . Depression   . Dyspnea   . Fractures   . Hyperlipidemia   . Hypertension   . Mental retardation, mild (I.Q. 50-70)    lives alone and cares for self has a nurse aide comes once a day   . Obesity   . Pneumonia   . PONV (postoperative nausea and vomiting)   . Seizure ([place])    11/17/2012 "still having them; "I believe their from a tumor in my head" ((11/17/2012  . Seizure disorder ([place])   . Thyroid cancer [facility])    Past Surgical History:  Procedure Laterality Date  . ABDOMINAL HYSTERECTOMY  2001  . BREAST LUMPECTOMY  1996   right   . COLONOSCOPY N/A 05/16/2016   Procedure: COLONOSCOPY;  Surgeon: Danie Binder, MD;  Location: [facility];  Service: Endoscopy;  Laterality: N/A;  830   . INGUINAL HERNIA REPAIR N/A 08/12/2019   Procedure: laparoscopic incisional hernia repair;  Surgeon: Lucas Mallow, MD;  Location: [facility];  Service: Urology;  Laterality: N/A;  . ROBOTIC ASSITED PARTIAL NEPHRECTOMY Right 08/05/2019   Procedure: XI ROBOTIC ASSITED PARTIAL NEPHRECTOMY WITH INTRAOPERATIVE ULTRASOUND;  Surgeon: [name], MD;  Location: [facility];  Service: Urology;  Laterality: Right;  3 HRS  . THYROIDECTOMY Bilateral 11/17/2012   Procedure: TOTAL THYROIDECTOMY;  Surgeon: Ascencion Dike, MD;  Location: [facility];  Service: ENT;  Laterality: Bilateral;  . TOTAL THYROIDECTOMY Bilateral 11/17/2012  . TUBAL LIGATION     Social  History   Socioeconomic History  . Marital status: Single    Spouse name: Not on file  . Number of children: 2  . Years of education: Not on file  . Highest education level: Not on file  Occupational History  . Occupation: disabled   Tobacco Use  . Smoking status: Never Smoker  . Smokeless tobacco: Never Used  Vaping Use  . Vaping Use: Never used  Substance and Sexual Activity  . Alcohol use: No  . Drug use: No  . Sexual activity: Never  Other Topics Concern  . Not on file  Social History Narrative  . Not on file   Social Determinants of Health   Financial Resource Strain:   . Difficulty of Paying Living Expenses: Not on file  Food Insecurity:   . Worried About Charity fundraiser in the Last Year: Not on file  . Ran Out of Food in the Last Year: Not on file  Transportation Needs:   . Lack of Transportation (Medical): Not on file  . Lack of Transportation (Non-Medical): Not on file  Physical Activity:   . Days of Exercise per Week: Not on file  . Minutes of Exercise per Session: Not on file  Stress:   .  Feeling of Stress : Not on file  Social Connections:   . Frequency of Communication with Friends and Family: Not on file  . Frequency of Social Gatherings with Friends and Family: Not on file  . Attends Religious Services: Not on file  . Active Member of Clubs or Organizations: Not on file  . Attends Archivist Meetings: Not on file  . Marital Status: Not on file   Outpatient Encounter Medications as of 11/09/2019  Medication Sig  . acetaminophen (TYLENOL) 500 MG tablet Take 1 tablet (500 mg total) by mouth 2 (two) times daily. (Patient taking differently: Take 500 mg by mouth 2 (two) times daily as needed for mild pain or headache. )  . amLODipine (NORVASC) 2.5 MG tablet Take 1 tablet (2.5 mg total) by mouth daily.  Marland Kitchen apixaban (ELIQUIS) 5 MG TABS tablet Take 1 tablet (5 mg total) by mouth 2 (two) times daily.  . benazepril (LOTENSIN) 20 MG tablet Take 20  mg by mouth daily.  . calcitRIOL (ROCALTROL) 0.25 MCG capsule Take 0.25 mcg by mouth daily. \  . diltiazem (CARDIZEM CD) 180 MG 24 hr capsule Take 1 capsule (180 mg total) by mouth daily.  Marland Kitchen diltiazem (TIAZAC) 180 MG 24 hr capsule Take by mouth.  . ezetimibe (ZETIA) 10 MG tablet Take 1 tablet (10 mg total) by mouth daily.  Marland Kitchen FLUoxetine (PROZAC) 10 MG capsule Take 1 capsule (10 mg total) by mouth daily.  . Fluoxetine HCl, PMDD, 10 MG CAPS Take one capsule by mouth once daily  . LAMICTAL 150 MG tablet TAKE 1 TABLET BY MOUTH ONCE DAILY.  Marland Kitchen levETIRAcetam (KEPPRA) 750 MG tablet Two tablets twice daily (Patient taking differently: Take 1,500 mg by mouth 2 (two) times daily. )  . levothyroxine (SYNTHROID) 125 MCG tablet Take 1 tablet (125 mcg total) by mouth daily before breakfast.  . omeprazole (PRILOSEC) 20 MG capsule Take 20 mg by mouth daily.  . pantoprazole (PROTONIX) 40 MG tablet Take 1 tablet (40 mg total) by mouth 2 (two) times daily.  . potassium chloride SA (KLOR-CON) 20 MEQ tablet Take by mouth. One time daily  . [DISCONTINUED] levothyroxine (SYNTHROID) 137 MCG tablet Take 1 tablet (137 mcg total) by mouth daily before breakfast.   No facility-administered encounter medications on file as of 11/09/2019.   ALLERGIES: Allergies  Allergen Reactions  . Simvastatin Other (See Comments)    Muscle aches   VACCINATION STATUS: Immunization History  Administered Date(s) Administered  . H1N1 12/16/2007  . Influenza Split 11/24/2011  . Influenza Whole 11/30/2006, 12/07/2008, 12/26/2009  . Influenza,inj,Quad PF,6+ Mos 11/18/2012, 12/06/2013, 05/23/2015, 01/29/2016, 12/01/2016, 01/18/2018, 12/01/2018  . Pneumococcal Conjugate-13 04/03/2014  . Pneumococcal Polysaccharide-23 05/10/2019  . Td 08/22/2003  . Tdap 04/02/2016  . Unspecified SARS-COV-2 Vaccination 05/30/2019    HPI  64 yr old female with medical hx as above . She is here to follow-up for post surgical hypothyroidism and for  follow-up of Papillary  thyroid cancer. 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 She has had  Thyrogen stimulated remnant ablation with negative post therapy WBS in 2014.  - Second Thyrogen Stimulated whole body scan for surveillance shows no scintigraphic evidence of iodine avid metastasis papillary thyroid cancer on 04/18/2016.   - Most recent surveillance imaging with Thyrogen stimulated whole-body scan on September 27, 2018 showed no evidence of tumor recurrence.  -Her ultrasound from November 07, 2019  was consistent with surgical changes with no  New sign of recurrence or residual tumor.   She is now on Synthroid 137 g by mouth every morning.   -She has lost about 10 pounds since last visit, does not have any new complaints.  Her previsit labs for thyroid function tests are consistent with slight over replacement.   she denies palpitations. she denies dysphagia, SOB, nor voice change. she denies family history of thyroid cancer.  Review of systems: Limited as above.  Objective:    BP 110/76   Pulse 80   Ht 5\' 5"  (1.651 m)   Wt 220 lb 6.4 oz (100 kg)   BMI 36.68 kg/m   Wt Readings from Last 3 Encounters:  11/09/19 220 lb 6.4 oz (100 kg)  10/27/19 219 lb (99.3 kg)  10/14/19 230 lb (104.3 kg)     Physical Exam- Limited  Constitutional:  Body mass index is 36.68 kg/m. , not in acute distress, normal state of mind Eyes:  EOMI, no exophthalmos Neck: supple Thyroid: + Post Thyroidectomy scar.   Respiratory: Adequate breathing efforts Musculoskeletal: no gross deformities, strength intact in all four extremities, no gross restriction of joint movements Skin:  no rashes, no hyperemia Neurological: no tremor with outstretched hands,    Results for orders placed or performed in visit on 10/06/19  Lipid panel  Result Value Ref Range   Cholesterol 140  <200 mg/dL   HDL 42 (L) > OR = 50 mg/dL   Triglycerides 98 <150 mg/dL   LDL Cholesterol (Calc) 80 mg/dL (calc)   Total CHOL/HDL Ratio 3.3 <5.0 (calc)   Non-HDL Cholesterol (Calc) 98 <130 mg/dL (calc)  CBC  Result Value Ref Range   WBC 6.1 3.8 - 10.8 Thousand/uL   RBC 3.79 (L) 3.80 - 5.10 Million/uL   Hemoglobin 10.6 (L) 11.7 - 15.5 g/dL   HCT 32.6 (L) 35 - 45 %   MCV 86.0 80.0 - 100.0 fL   MCH 28.0 27.0 - 33.0 pg   MCHC 32.5 32.0 - 36.0 g/dL   RDW 17.3 (H) 11.0 - 15.0 %   Platelets 233 140 - 400 Thousand/uL   MPV 10.9 7.5 - 12.5 fL  COMPLETE METABOLIC PANEL WITH GFR  Result Value Ref Range   Glucose, Bld 96 65 - 99 mg/dL   BUN 29 (H) 7 - 25 mg/dL   Creat 2.96 (H) 0.50 - 0.99 mg/dL   GFR, Est Non African American 16 (L) > OR = 60 mL/min/1.53m2   GFR, Est African American 18 (L) > OR = 60 mL/min/1.35m2   BUN/Creatinine Ratio 10 6 - 22 (calc)   Sodium 143 135 - 146 mmol/L   Potassium 3.5 3.5 - 5.3 mmol/L   Chloride 109 98 - 110 mmol/L   CO2 22 20 - 32 mmol/L   Calcium 10.2 8.6 - 10.4 mg/dL   Total Protein 7.7 6.1 - 8.1 g/dL   Albumin 3.7 3.6 - 5.1 g/dL   Globulin 4.0 (H) 1.9 - 3.7 g/dL (calc)   AG Ratio 0.9 (L) 1.0 - 2.5 (calc)   Total Bilirubin 0.6 0.2 - 1.2 mg/dL   Alkaline phosphatase (APISO) 97 37 - 153 U/L   AST 185 (H) 10 - 35 U/L   ALT 139 (H) 6 - 29 U/L  Acute Hep Panel & Hep B Surface Ab  Result Value Ref Range   Hep A IgM NON-REACTIVE NON-REACTI   Hepatitis B Surface Ag NON-REACTIVE NON-REACTI   Hep B C IgM NON-REACTIVE  NON-REACTI   HEPATITIS C ANTIBODY REFILL$(REFL) NON-REACTIVE NON-REACTI   SIGNAL TO CUT-OFF 0.20 <1.00  TEST AUTHORIZATION  Result Value Ref Range   TEST NAME: ACUTE HEPATITIS PANEL (REFL)    TEST CODE: [id number]    CLIENT CONTACT: ABBY WINGATE    REPORT ALWAYS MESSAGE SIGNATURE    REFLEX TIQ  Result Value Ref Range   REFLEX TIQ     Complete Blood Count (Most recent): Lab Results  Component Value Date   WBC 6.1 10/06/2019   HGB 10.6  (L) 10/06/2019   HCT 32.6 (L) 10/06/2019   MCV 86.0 10/06/2019   PLT 233 10/06/2019   Diabetic Labs (most recent): Lab Results  Component Value Date   HGBA1C 5.2 05/26/2017   HGBA1C 5.3 08/10/2012   Lipid Panel     Component Value Date/Time   CHOL 140 10/06/2019 0951   CHOL 231 (H) 06/18/2016 0954   TRIG 98 10/06/2019 0951   HDL 42 (L) 10/06/2019 0951   HDL 46 06/18/2016 0954   CHOLHDL 3.3 10/06/2019 0951   VLDL 14 01/29/2016 1109   LDLCALC 80 10/06/2019 0951    June 01, 2017  IMPRESSION:  Questioned approximately 2.2 cm soft tissue within the right lobectomy resection bed appears similar to the 07/2015 examination.  Note, while nonspecific, this tissue was apparently present at the time of the Thyrogen stimulated I 131 nuclear medicine whole body scan performed 03/2016 which was negative for the presence of iodine avid metastatic thyroid cancer or residual disease, and thus favored to be benign etiology. Clinical correlation is advised.  Electronically Signed   By: Sandi Mariscal M.D.   On: 06/01/2017 11:38   Thyrogen stimulated whole-body scan September 27, 2018 fINDINGS:  Excreted tracer within colon and urinary bladder. Small amount of retained gastric activity. Pattern is similar to that seen on the previous exam.  No sites of iodine-avid metastatic thyroid cancer identified. No residual tracer accumulation in the neck.  IMPRESSION: No scintigraphic evidence of iodine-avid metastatic thyroid cancer.    Ultrasound of thyroid on November 07, 2019 -Right lobe 2.5 x 1.2 x 0.9 cm, left lobe surgically absent. Stable atrophic heterogeneous residual right thyroid lobe as before. No hypervascularity. No left thyroid bed abnormality. No regional adenopathy.  IMPRESSION: Stable thyroid ultrasound compared 2019 as above.  Assessment & Plan:   1. Hypothyroidism, postsurgical -Her repeat thyroid function tests are consistent with over replacement.  She is  approached for lower dose of levothyroxine.  I discussed and prescribed levothyroxine 125 mcg p.o. daily before breakfast.     - We discussed about the correct intake of her thyroid hormone, on empty stomach at fasting, with water, separated by at least 30 minutes from breakfast and other medications,  and separated by more than 4 hours from calcium, iron, multivitamins, acid reflux medications (PPIs). -Patient is made aware of the fact that thyroid hormone replacement is needed for life, dose to be adjusted by periodic monitoring of thyroid function tests.   - Target TSH for her would be between 0.1-0.5.  2. Papillary thyroid carcinoma ([place]) Her  surveillance neck /thyroid u/s from 11/17/2013 was c/w surgically absent thyroid, however there is a 0.9 cm left cervical lymph node. See below.  Pt is s/p thyrogen stimulated thyroid remnant ablation with WBS on 01/14/2013 with no evidence of iodine avid metastasis. She has had pT2,Nx,Mx FVPTC, s/p near total thyroidectomy on September 24,2014. She is counseled to maintain high degree of compliance with synthroid therapy with aim of  keeping TSH low normal ( 0.1-0.5).  -she is advised on the necessity of follow-ups and imaging studies at least one annually for the next 5 years. - She was sent for core biopsy of 0.9 cm left cervical lymph node last visit. However, this procedure was abandoned due to " no evidence of adenopathy".  The previously noticed  left cervical lymph node was sent to decrease in size to 0.5 cm. Medicaid did not cover surveillance ultrasound. - Second Thyrogen estimated or body scan for surveillance shows no scintigraphic evidence of iodine avid metastasis papillary thyroid cancer on 04/18/2016. -June 01, 2017 surveillance thyroid/neck ultrasound : Unremarkable, see above.   -She underwent  Thyrogen stimulated whole body scan which is negative for tumor recurrence.   Her previsit surveillance thyroid/neck ultrasound is not  remarkable for any new findings.  She will not need any further intervention at this time.    She is advised to keep close follow-up with Dr. Tula Nakayama for primary care needs.     - Time spent on this patient care encounter:  20 minutes of which 50% was spent in  counseling and the rest reviewing  her current and  previous labs / studies and medications  doses and developing a plan for long term care. Delrae Alfred Mustin  participated in the discussions, expressed understanding, and voiced agreement with the above plans.  All questions were answered to her satisfaction. she is encouraged to contact clinic should she have any questions or concerns prior to her return visit.    Follow up plan: Return in about 4 months (around 03/10/2020) for F/U with Pre-visit Labs.  Glade Lloyd, MD Phone: [phone]  Fax: [phone]  -  This note was partially dictated with voice recognition software. Similar sounding words can be transcribed inadequately or may not  be corrected upon review.   11/09/2019, 12:21 PM

## 2019-11-10 ENCOUNTER — Ambulatory Visit: Payer: Medicare Other | Admitting: Internal Medicine

## 2019-11-10 ENCOUNTER — Telehealth: Payer: Medicare Other | Admitting: Family Medicine

## 2019-11-16 ENCOUNTER — Other Ambulatory Visit: Payer: Self-pay | Admitting: Family Medicine

## 2019-11-18 ENCOUNTER — Telehealth: Payer: Self-pay

## 2019-11-18 ENCOUNTER — Other Ambulatory Visit: Payer: Self-pay | Admitting: Family Medicine

## 2019-11-18 ENCOUNTER — Other Ambulatory Visit: Payer: Self-pay

## 2019-11-18 MED ORDER — DILTIAZEM HCL ER COATED BEADS 180 MG PO CP24: 180.0000 mg | ORAL_CAPSULE | Freq: Every day | ORAL | 5 refills | Status: DC

## 2019-11-18 NOTE — Telephone Encounter (Signed)
She has a fib and needs to be on the cardizem, pls re send

## 2019-11-18 NOTE — Telephone Encounter (Signed)
Does not need protonix, ince on omeprazole, I am discontinuing from med list.

## 2019-11-18 NOTE — Telephone Encounter (Signed)
Med sent and lydia aware

## 2019-11-18 NOTE — Telephone Encounter (Signed)
[name]-- I saw [name] on Tuesday this week and she is missing her diltiazem 180 mg and protonix 40 mg. I requested [facility] to send it out to her 2 weeks ago and as of Tuesday, she still doesn't have it. I reached out to the pharmacy again and we decided that we would check with Dr. Moshe Cipro to see if she needed to continue both since she hasn't taken either in 2 weeks. Can you ask Dr. Moshe Cipro if she need it since her BP & HR has been well controlled without it, (see [facility] visit) and home checks. Her BP is on the low end of normal around 110/70 & HR around 70-80.  Also, since she is on omeprazole, does she need the protonix too?

## 2019-11-24 ENCOUNTER — Telehealth: Payer: Self-pay | Admitting: Family Medicine

## 2019-11-24 NOTE — Telephone Encounter (Signed)
CAP FORMS RECEIVED  COPIED NOTED  SLEEVED

## 2019-11-28 DIAGNOSIS — E785 Hyperlipidemia, unspecified: Secondary | ICD-10-CM

## 2019-11-28 DIAGNOSIS — I4891 Unspecified atrial fibrillation: Secondary | ICD-10-CM | POA: Diagnosis not present

## 2019-11-28 DIAGNOSIS — I10 Essential (primary) hypertension: Secondary | ICD-10-CM | POA: Diagnosis not present

## 2019-11-28 DIAGNOSIS — R569 Unspecified convulsions: Secondary | ICD-10-CM | POA: Diagnosis not present

## 2019-11-28 DIAGNOSIS — F78A9 Other genetic related intellectual disability: Secondary | ICD-10-CM | POA: Diagnosis not present

## 2019-11-29 ENCOUNTER — Telehealth: Payer: Self-pay | Admitting: Family Medicine

## 2019-11-29 ENCOUNTER — Other Ambulatory Visit: Payer: Self-pay

## 2019-11-29 ENCOUNTER — Ambulatory Visit (INDEPENDENT_AMBULATORY_CARE_PROVIDER_SITE_OTHER): Payer: Medicare Other | Admitting: Family Medicine

## 2019-11-29 ENCOUNTER — Encounter: Payer: Self-pay | Admitting: Family Medicine

## 2019-11-29 VITALS — BP 133/81 | HR 91 | Resp 16 | Ht 65.0 in | Wt 203.0 lb

## 2019-11-29 DIAGNOSIS — E785 Hyperlipidemia, unspecified: Secondary | ICD-10-CM

## 2019-11-29 DIAGNOSIS — E038 Other specified hypothyroidism: Secondary | ICD-10-CM

## 2019-11-29 DIAGNOSIS — F320 Major depressive disorder, single episode, mild: Secondary | ICD-10-CM

## 2019-11-29 DIAGNOSIS — Z1231 Encounter for screening mammogram for malignant neoplasm of breast: Secondary | ICD-10-CM

## 2019-11-29 DIAGNOSIS — I4891 Unspecified atrial fibrillation: Secondary | ICD-10-CM

## 2019-11-29 DIAGNOSIS — Z23 Encounter for immunization: Secondary | ICD-10-CM

## 2019-11-29 DIAGNOSIS — I10 Essential (primary) hypertension: Secondary | ICD-10-CM

## 2019-11-29 MED ORDER — BENAZEPRIL HCL 20 MG PO TABS
20.0000 mg | ORAL_TABLET | Freq: Every day | ORAL | 5 refills | Status: DC
Start: 2019-11-29 — End: 2020-05-31

## 2019-11-29 MED ORDER — OMEPRAZOLE 20 MG PO CPDR: 20.0000 mg | DELAYED_RELEASE_CAPSULE | Freq: Every day | ORAL | 5 refills | Status: DC

## 2019-11-29 MED ORDER — DILTIAZEM HCL ER COATED BEADS 180 MG PO CP24: 180.0000 mg | ORAL_CAPSULE | Freq: Every day | ORAL | 5 refills | Status: DC

## 2019-11-29 NOTE — Telephone Encounter (Signed)
She takes diltiazem 180, benazepril 20, and amlodipine 2.5.  [name] sees her tomorrow and will check on covid vaccine

## 2019-11-29 NOTE — Telephone Encounter (Signed)
pls contact L[facility],  verify exactly what pt is taking for BP .  It is good so she needs to stay on it . I tHINK benazepril 20 mg one daily and diltiazem ER 180 mg daily. If so please send in 6 months total. Also I have sent in the omeprazole 20 mg one daily and stopped pantoprazole She needs to send to Dr Merlene Laughter for refill on Lamictal , though I have filled in the past best if from his office Can she please get information ov covid vaccne , what , where and when so we can follow through, pt says she went to [employer] with her sister and got a vaccine there. Thanks

## 2019-11-29 NOTE — Patient Instructions (Signed)
F/U  in office with MD  End January, call if you need me sooner  Flu vaccine at visit  Please schedule mammogram at checkout  Nurse will contact [name] to verify your current blood pressur4e medication, it is excellent, we will refill when certain.  [name] will be contacted to help to establish when you got the Covid vaccine(s) so we can complete and follow through, talk to family for help with this  Fasting lipid, cmp and EGFR 1 week before January follow up  It is important that you exercise regularly at least 30 minutes 5 times a week. If you develop chest pain, have severe difficulty breathing, or feel very tired, stop exercising immediately and seek medical attention  Think about what you will eat, plan ahead. Choose " clean, green, fresh or frozen" over canned, processed or packaged foods which are more sugary, salty and fatty. 70 to 75% of food eaten should be vegetables and fruit. Three meals at set times with snacks allowed between meals, but they must be fruit or vegetables. Aim to eat over a 12 hour period , example 7 am to 7 pm, and STOP after  your last meal of the day. Drink water,generally about 64 ounces per day, no other drink is as healthy. Fruit juice is best enjoyed in a healthy way, by EATING the fruit. Thanks for choosing [facility] [address] we consider it a privelige to serve you.

## 2019-11-30 ENCOUNTER — Other Ambulatory Visit: Payer: Self-pay | Admitting: Family Medicine

## 2019-12-02 ENCOUNTER — Encounter: Payer: Self-pay | Admitting: Gastroenterology

## 2019-12-02 ENCOUNTER — Encounter: Payer: Self-pay | Admitting: Family Medicine

## 2019-12-02 ENCOUNTER — Other Ambulatory Visit: Payer: Self-pay

## 2019-12-02 ENCOUNTER — Ambulatory Visit (INDEPENDENT_AMBULATORY_CARE_PROVIDER_SITE_OTHER): Payer: Medicare Other | Admitting: Gastroenterology

## 2019-12-02 VITALS — BP 111/71 | HR 105 | Temp 97.8°F | Ht 65.0 in | Wt 220.6 lb

## 2019-12-02 DIAGNOSIS — R945 Abnormal results of liver function studies: Secondary | ICD-10-CM | POA: Diagnosis not present

## 2019-12-02 DIAGNOSIS — E640 Sequelae of protein-calorie malnutrition: Secondary | ICD-10-CM

## 2019-12-02 DIAGNOSIS — D509 Iron deficiency anemia, unspecified: Secondary | ICD-10-CM

## 2019-12-02 DIAGNOSIS — R7989 Other specified abnormal findings of blood chemistry: Secondary | ICD-10-CM

## 2019-12-02 NOTE — Assessment & Plan Note (Signed)
Controlled, no change in medication DASH diet and commitment to daily physical activity for a minimum of 30 minutes discussed and encouraged, as a part of hypertension management. The importance of attaining a healthy weight is also discussed.  BP/Weight 12/02/2019 11/29/2019 11/09/2019 10/27/2019 10/14/2019 10/14/2019 9/79/4801  Systolic BP 655 374 827 078 675 449 201  Diastolic BP 71 81 76 74 58 72 78  Wt. (Lbs) 220.6 203 220.4 219 230 - -  BMI 36.71 33.78 36.68 36.44 38.27 - -

## 2019-12-02 NOTE — Progress Notes (Signed)
[name]     MRN: [id number]      DOB: 1955/08/10   HPI [name] is here for follow up and re-evaluation of chronic medical conditions, medication management and review of any available recent lab and radiology data.  Preventive health is updated, specifically  Cancer screening and Immunization.   Recent hospital record is reviewed and medications reconciled with her home health nurse The PT denies any adverse reactions to current medications since the last visit.  There are no new concerns.  There are no specific complaints   ROS Denies recent fever or chills. Denies sinus pressure, nasal congestion, ear pain or sore throat. Denies chest congestion, productive cough or wheezing. Denies chest pains, palpitations and leg swelling Denies abdominal pain, nausea, vomiting,diarrhea or constipation.   Denies dysuria, frequency, hesitancy or incontinence. Denies joint pain, swelling and limitation in mobility. Denies headaches, seizures, numbness, or tingling. Denies depression, anxiety or insomnia. Denies skin break down or rash.   PE  BP 133/81   Pulse 91   Resp 16   Ht 5\' 5"  (1.651 m)   Wt 203 lb (92.1 kg)   SpO2 97%   BMI 33.78 kg/m   Patient alert and oriented and in no cardiopulmonary distress.  HEENT: No facial asymmetry, EOMI,     Neck supple .  Chest: Clear to auscultation bilaterally.  CVS: S1, S2 no murmurs, no S3.Regular rate.  ABD: Soft non tender.   Ext: No edema  MS: Adequate ROM spine, shoulders, hips and knees.  Skin: Intact, no ulcerations or rash noted.  Psych: Good eye contact, normal affect. Memory intact not anxious or depressed appearing.  CNS: CN 2-12 intact, power,  normal throughout.no focal deficits noted.   Assessment & Plan  Hyperlipidemia LDL goal <100 Hyperlipidemia:Low fat diet discussed and encouraged.   Lipid Panel  Lab Results  Component Value Date   CHOL 140 10/06/2019   HDL 42 (L) 10/06/2019   LDLCALC 80  10/06/2019   TRIG 98 10/06/2019   CHOLHDL 3.3 10/06/2019     Needs to increase exercise, otherwise at goal  Hypothyroidism Managed by Endo and controlled  Atrial fibrillation with RVR ([name]) New diagnosis will establish with cardiology for f/u . On chronic anti coagulation. Rate currently controlled   Depression, major, single episode, mild ([facility]) Controlled, no change in medication   Essential hypertension Controlled, no change in medication DASH diet and commitment to daily physical activity for a minimum of 30 minutes discussed and encouraged, as a part of hypertension management. The importance of attaining a healthy weight is also discussed.  BP/Weight 12/02/2019 11/29/2019 11/09/2019 10/27/2019 10/14/2019 10/14/2019 0/93/8182  Systolic BP 993 716 967 893 810 175 102  Diastolic BP 71 81 76 74 58 72 78  Wt. (Lbs) 220.6 203 220.4 219 230 - -  BMI 36.71 33.78 36.68 36.44 38.27 - -       Morbid obesity ([place]) Obesity linked with hypertension , depression   Patient re-educated about  the importance of commitment to a  minimum of 150 minutes of exercise per week as able.  The importance of healthy food choices with portion control discussed, as well as eating regularly and within a 12 hour window most days. The need to choose "clean , green" food 50 to 75% of the time is discussed, as well as to make water the primary drink and set a goal of 64 ounces water daily.    Weight /BMI 12/02/2019 11/29/2019 11/09/2019  WEIGHT  220 lb 9.6 oz 203 lb 220 lb 6.4 oz  HEIGHT 5\' 5"  5\' 5"  5\' 5"   BMI 36.71 kg/m2 33.78 kg/m2 36.68 kg/m2

## 2019-12-02 NOTE — Progress Notes (Signed)
Primary Care Physician:  [name], MD  Primary Gastroenterologist:  Elon Alas. Abbey Chatters, DO   Chief Complaint  Patient presents with  . Anemia    no dark/bloody stools, no diarrhea, no constipation    HPI:  JONETTA DAGLEY is a 64 y.o. female here today for 2 separate issues.  Office visit was made at the request of Dr. Moshe Cipro for further evaluation and management of abnormal LFTs.  She is also being seen today at the request of Dr. Theador Hawthorne for iron deficiency anemia.  Patient has a history of stage IIIb renal insufficiency, obesity, mild cognitive delays although able to live alone with assistance from family, seizure disorder, thyroid cancer status post treatment with subsequent hypothyroidism, Afib on Eliquis.  We last saw the patient at time of colonoscopy in March 2018, she had a redundant colon, hemorrhoids but otherwise unremarkable.  Recommended 10-year follow-up colonoscopy.  Patient presents today with complaints of postprandial abdominal bloating/swelling.  Has been going on since June.  No nausea or vomiting.  No heartburn.  Vague swallowing issues at times but she relates this to her prior thyroid surgery.  Bowel movements are regular.  No blood in the stool or melena.     Back in June 2021 she underwent right partial nephrectomy for right mass (papillary renal cell carcinoma) in the setting of renal insufficiency.  Shortly after discharge from her kidney surgery, she presented back with partial small bowel obstruction secondary to partial herniation of small bowel into the right sided lateral laparoscopic port.  She underwent diagnostic laparoscopy with port site incisional hernia repair.  She did receive blood transfusions during her hospital stay, 2 units.Received 2 iron infusions back in August.  Back in March she had a normal hemoglobin of 12.1.  Hemoglobin was 10.1 on August 06, 2019 after her kidney surgery.  Hemoglobin dropped down in the 7.6 during her second  hospitalization for hernia/bowel obstruction.iron was 92, iron saturation 51%, ferritin 476.  Labs from August 12: Hemoglobin 10.6, hematocrit 32.6, MCV 86.  Hepatitis A IgM negative, hepatitis B surface antigen negative, hepatitis B core IgM negative, hepatitis C antibody negative.  Labs from nephrology dated November 25, 2019 in care everywhere: Hemoglobin normal at 11.8, hematocrit 37.1, MCV 89, creatinine 2.14, BUN 54.  Labs August 2021: Iron sat 7%, iron 16, TIBC 232.  Component     Latest Ref Rng & Units 05/03/2019  Total Bilirubin     0.2 - 1.2 mg/dL 0.5  Alkaline phosphatase (APISO)     37 - 153 U/L 105  AST     10 - 35 U/L 15  ALT     6 - 29 U/L 11   Component     Latest Ref Rng & Units 08/12/2019  Total Bilirubin     0.2 - 1.2 mg/dL 1.1  Alkaline phosphatase (APISO)     37 - 153 U/L   AST     10 - 35 U/L 121 (H)  ALT     6 - 29 U/L 194 (H)      Component     Latest Ref Rng & Units 10/06/2019  Total Bilirubin     0.2 - 1.2 mg/dL 0.6  Alkaline phosphatase (APISO)     37 - 153 U/L 97  AST     10 - 35 U/L 185 (H)  ALT     6 - 29 U/L 139 (H)   Right upper quadrant ultrasound August 2021 IMPRESSION: 1. Mildly heterogeneous  appearance of the liver, nonspecific but can be seen in hepatocellular disease state such as hepatitis. No focal liver lesion is identified. 2. Nonvascular masslike intraluminal structure within the gallbladder lumen most suggestive of tumefactive biliary sludge. An intraluminal neoplasm is felt to be unlikely given the sonographic features as well as the appearance of the gallbladder on recent MRI (06/2019) and CT (04/2019/07/2019/07/2019)      Current Outpatient Medications  Medication Sig Dispense Refill  . acetaminophen (TYLENOL) 500 MG tablet Take 1 tablet (500 mg total) by mouth 2 (two) times daily. (Patient taking differently: Take 500 mg by mouth 2 (two) times daily as needed for mild pain or headache. ) 60 tablet 5  . amLODipine (NORVASC)  2.5 MG tablet Take 1 tablet (2.5 mg total) by mouth daily. 30 tablet 5  . apixaban (ELIQUIS) 5 MG TABS tablet Take 1 tablet (5 mg total) by mouth 2 (two) times daily. 60 tablet 3  . benazepril (LOTENSIN) 20 MG tablet Take 1 tablet (20 mg total) by mouth daily. 30 tablet 5  . calcitRIOL (ROCALTROL) 0.25 MCG capsule Take 0.25 mcg by mouth daily. \    . diltiazem (CARDIZEM CD) 180 MG 24 hr capsule Take 1 capsule (180 mg total) by mouth daily. 30 capsule 5  . diltiazem (TIAZAC) 180 MG 24 hr capsule Take by mouth.    . ezetimibe (ZETIA) 10 MG tablet TAKE ONE TABLET BY MOUTH ONCE DAILY. 30 tablet 0  . Fluoxetine HCl, PMDD, 10 MG CAPS Take one capsule by mouth once daily 30 capsule 5  . LAMICTAL 150 MG tablet TAKE 1 TABLET BY MOUTH ONCE DAILY. 30 tablet 0  . levETIRAcetam (KEPPRA) 750 MG tablet Two tablets twice daily (Patient taking differently: Take 1,500 mg by mouth 2 (two) times daily. ) 120 tablet 3  . levothyroxine (SYNTHROID) 125 MCG tablet Take 1 tablet (125 mcg total) by mouth daily before breakfast. 90 tablet 1  . omeprazole (PRILOSEC) 20 MG capsule Take 1 capsule (20 mg total) by mouth daily. 30 capsule 5  . potassium chloride SA (KLOR-CON) 20 MEQ tablet Take by mouth. One time daily     No current facility-administered medications for this visit.    Allergies as of 12/02/2019 - Review Complete 12/02/2019  Allergen Reaction Noted  . Simvastatin Other (See Comments) 05/23/2015    Past Medical History:  Diagnosis Date  . A-fib ([place])   . Anemia   . Depression   . Dyspnea   . Fractures   . Hyperlipidemia   . Hypertension   . Mental retardation, mild (I.Q. 50-70)    lives alone and cares for self has a nurse aide comes once a day   . Obesity   . Pneumonia   . PONV (postoperative nausea and vomiting)   . Seizure ([place])    11/17/2012 "still having them; "I believe their from a tumor in my head" ((11/17/2012  . Seizure disorder ([place])   . Thyroid cancer [facility])     Past Surgical  History:  Procedure Laterality Date  . ABDOMINAL HYSTERECTOMY  2001  . BREAST LUMPECTOMY  1996   right   . COLONOSCOPY N/A 05/16/2016   Procedure: COLONOSCOPY;  Surgeon: Danie Binder, MD;  Location: [facility];  Service: Endoscopy;  Laterality: N/A;  830   . INGUINAL HERNIA REPAIR N/A 08/12/2019   Procedure: laparoscopic incisional hernia repair;  Surgeon: Lucas Mallow, MD;  Location: [facility];  Service: Urology;  Laterality: N/A;  .  ROBOTIC ASSITED PARTIAL NEPHRECTOMY Right 08/05/2019   Procedure: XI ROBOTIC ASSITED PARTIAL NEPHRECTOMY WITH INTRAOPERATIVE ULTRASOUND;  Surgeon: [name], MD;  Location: [facility];  Service: Urology;  Laterality: Right;  3 HRS  . THYROIDECTOMY Bilateral 11/17/2012   Procedure: TOTAL THYROIDECTOMY;  Surgeon: Ascencion Dike, MD;  Location: [place];  Service: ENT;  Laterality: Bilateral;  . TOTAL THYROIDECTOMY Bilateral 11/17/2012  . TUBAL LIGATION      Family History  Problem Relation Age of Onset  . Diabetes Mother   . Colon cancer Neg Hx     Social History   Socioeconomic History  . Marital status: Single    Spouse name: Not on file  . Number of children: 2  . Years of education: Not on file  . Highest education level: Not on file  Occupational History  . Occupation: disabled   Tobacco Use  . Smoking status: Never Smoker  . Smokeless tobacco: Never Used  Vaping Use  . Vaping Use: Never used  Substance and Sexual Activity  . Alcohol use: No  . Drug use: No  . Sexual activity: Never  Other Topics Concern  . Not on file  Social History Narrative  . Not on file   Social Determinants of Health   Financial Resource Strain:   . Difficulty of Paying Living Expenses: Not on file  Food Insecurity:   . Worried About Charity fundraiser in the Last Year: Not on file  . Ran Out of Food in the Last Year: Not on file  Transportation Needs:   . Lack of Transportation (Medical): Not on file  . Lack of Transportation (Non-Medical): Not on file   Physical Activity:   . Days of Exercise per Week: Not on file  . Minutes of Exercise per Session: Not on file  Stress:   . Feeling of Stress : Not on file  Social Connections:   . Frequency of Communication with Friends and Family: Not on file  . Frequency of Social Gatherings with Friends and Family: Not on file  . Attends Religious Services: Not on file  . Active Member of Clubs or Organizations: Not on file  . Attends Archivist Meetings: Not on file  . Marital Status: Not on file  Intimate Partner Violence:   . Fear of Current or Ex-Partner: Not on file  . Emotionally Abused: Not on file  . Physically Abused: Not on file  . Sexually Abused: Not on file      ROS:  General: Negative for anorexia, weight loss, fever, chills, fatigue, weakness. Eyes: Negative for vision changes.  ENT: Negative for hoarseness, difficulty swallowing , nasal congestion. CV: Negative for chest pain, angina, palpitations, dyspnea on exertion, peripheral edema.  Respiratory: Negative for dyspnea at rest, dyspnea on exertion, cough, sputum, wheezing.  GI: See history of present illness. GU:  Negative for dysuria, hematuria, urinary incontinence, urinary frequency, nocturnal urination.  MS: Negative for joint pain, low back pain.  Derm: Negative for rash or itching.  Neuro: Negative for weakness, abnormal sensation, seizure, frequent headaches, memory loss, confusion.  Psych: Negative for anxiety, depression, suicidal ideation, hallucinations.  Endo: Negative for unusual weight change.  Heme: Negative for bruising or bleeding. Allergy: Negative for rash or hives.    Physical Examination:  BP 111/71   Pulse (!) 105   Temp 97.8 F (36.6 C) (Oral)   Ht 5\' 5"  (1.651 m)   Wt 220 lb 9.6 oz (100.1 kg)   BMI 36.71 kg/m  General: Well-nourished, well-developed in no acute distress.  Head: Normocephalic, atraumatic.   Eyes: Conjunctiva pink, no icterus. Mouth: masked. Neck: Supple  without thyromegaly, masses, or lymphadenopathy.  Lungs: Clear to auscultation bilaterally.  Heart: Regular rate and rhythm, no murmurs rubs or gallops.  Abdomen: Bowel sounds are normal, nontender, nondistended, no hepatosplenomegaly or masses, no abdominal bruits or    hernia , no rebound or guarding.   Rectal: not performed Extremities: No lower extremity edema. No clubbing or deformities.  Neuro: Alert and oriented x 4 , grossly normal neurologically.  Skin: Warm and dry, no rash or jaundice.   Psych: Alert and cooperative, normal mood and affect.  Labs: Lab Results  Component Value Date   CREATININE 2.96 (H) 10/06/2019   BUN 29 (H) 10/06/2019   NA 143 10/06/2019   K 3.5 10/06/2019   CL 109 10/06/2019   CO2 22 10/06/2019   Lab Results  Component Value Date   ALT 139 (H) 10/06/2019   AST 185 (H) 10/06/2019   ALKPHOS 102 08/12/2019   BILITOT 0.6 10/06/2019   Lab Results  Component Value Date   WBC 6.1 10/06/2019   HGB 10.6 (L) 10/06/2019   HCT 32.6 (L) 10/06/2019   MCV 86.0 10/06/2019   PLT 233 10/06/2019   Lab Results  Component Value Date   IRON 92 08/24/2019   TIBC 181 (L) 08/24/2019   FERRITIN 476 (H) 08/24/2019     No results found for: FOLATE   Imaging Studies: DG Bone Density  Result Date: 11/07/2019 EXAM: DUAL X-RAY ABSORPTIOMETRY (DXA) FOR BONE MINERAL DENSITY IMPRESSION: Your patient Kateri Balch completed a BMD test on 11/07/2019 using the Violet (software version: 14.10) manufactured by [employer]. The following summarizes the results of our evaluation. Technologist::TNB PATIENT BIOGRAPHICAL: Name: Akela, Pocius Patient ID: [id number] Birth Date: 08/17/1955 Height: 65.0 in. Gender: Female Exam Date: 11/07/2019 Weight: 219.0 lbs. Indications: History of Fracture (Adult), Low Calcium Intake, Post Menopausal Fractures: Toe Treatments: DENSITOMETRY RESULTS: Site         Region      Measured Date Measured Age WHO  Classification Young Adult T-score BMD         %Change vs. Previous Significant Change (*) DualFemur Total Right 11/07/2019 65.5 Osteoporosis -2.5 0.694 g/cm2 Left Forearm Radius 33% 11/07/2019 65.5 Normal -0.7 0.663 g/cm2 ASSESSMENT: The BMD measured at Femur Total Right is 0.694 g/cm2 with a T-score of -2.5. This patient is considered osteoporotic according to [facility]) criteria. The scan quality is good. Lumbar spine was excluded due to advanced degenerative changes. Patient is not a candidate for FRAX assessment due to diagnosis of osteoporosis. World Pharmacologist [facility]) criteria for post-menopausal, Caucasian Women: Normal:       T-score at or above -1 SD Osteopenia:   T-score between -1 and -2.5 SD Osteoporosis: T-score at or below -2.5 SD RECOMMENDATIONS: 1. All patients should optimize calcium and vitamin D intake. 2. Consider FDA-approved medical therapies in postmenopausal women and med aged 74 years and older, based on the following: a. A hip or vertebral (clinical or morphometric) fracture b. T-score< -2.5 at the femoral neck or spine after appropriate evaluation to exclude secondary causes c. Low bone mass (T-score between -1.0 and -2.5 at the femoral neck or spine) and a 10-year probability of a hip fracture > 3% or a 10-year probability of a major osteoporosis-related fracture > 20% based on the [place]-adapted WHO algorithm d. Clinician judgment and/or patient  preferences may indicate treatment for people with 10-year fracture probabilities above or below these levels FOLLOW-UP: People with diagnosed cases of osteoporosis or at high risk for fracture should have regular bone mineral density tests. For patients eligible for Medicare, routine testing is allowed once every 2 years. The testing frequency can be increased to one year for patients who have rapidly progressing disease, those who are receiving or discontinuing medical therapy to restore bone mass, or have additional  risk factors. I have reviewed this report, and agree with the above findings. [facility] Electronically Signed   By: Lowella Grip III M.D.   On: 11/07/2019 11:52   US THYROID  Result Date: 11/07/2019 CLINICAL DATA:  Thyroidectomy 2014 EXAM: THYROID ULTRASOUND TECHNIQUE: Ultrasound examination of the thyroid gland and adjacent soft tissues was performed. COMPARISON:  06/01/2017 FINDINGS: Parenchymal Echotexture: Moderately heterogenous Isthmus: Surgically absent Right lobe: 2.5 x 1.2 x 0.9 cm Left lobe: Surgically absent _________________________________________________________ Estimated total number of nodules >/= 1 cm: 0 Number of spongiform nodules >/=  2 cm not described below (TR1): 0 Number of mixed cystic and solid nodules >/= 1.5 cm not described below (TR2): 0 _________________________________________________________ Stable atrophic heterogeneous residual right thyroid lobe as before. No hypervascularity. No left thyroid bed abnormality. No regional adenopathy. IMPRESSION: Stable thyroid ultrasound compared 2019 as above. The above is in keeping with the ACR TI-RADS recommendations - J Am Coll Radiol 2017;14:587-595. Electronically Signed   By: Jerilynn Mages.  Shick M.D.   On: 11/07/2019 11:22    Impression/plan:  Very pleasant 64 year old female with multiple comorbidities as outlined above presenting for further evaluation of iron deficiency anemia and abnormal LFTs.  Clinically patient is doing well.  She has some vague postprandial bloating.  IDA: Patient with normal hemoglobin back in March.  Underwent partial nephrectomy for renal cell carcinoma back in June.  Short interval hospitalization for bowel obstruction requiring surgery as well.  Noted to have drop in hemoglobin.  She got 2 units of packed red blood cells during her hospitalization.  Received 2 iron infusions back in August.  Nephrology noted iron deficiency with their work-up.  Patient is on Eliquis for A. fib.  She  denies any overt GI bleeding.  Patient's last colonoscopy was in March 2018, unremarkable.  Suspect anemia chronic disease along with iron deficiency.  Update labs including CBC, anemia labs.  Further recommendations to follow.  Abnormal LFTs: Normal in March 2021.  First noted to be abnormal in June and on repeat in August.  Multiple imaging studies along the way, total of 3 CTs and MRI abdomen since June.  Gallbladder unremarkable on these studies.  Last study was a right upper quadrant ultrasound in August which showed a nonvascular masslike intraluminal structure within the gallbladder lumen most suggestive of tumefactive biliary sludge. An intraluminal neoplasm is felt to be unlikely given the sonographic features as well as the appearance of the gallbladder on recent MRI (06/2019) and CT (04/2019/07/2019/07/2019).  Viral markers negative.  Recommend updated labs, ceruloplasmin, autoimmune markers, etc.  Further recommendations to follow.  Will discuss results with her daughter, [name], per patient's request.

## 2019-12-02 NOTE — Assessment & Plan Note (Signed)
Hyperlipidemia:Low fat diet discussed and encouraged.   Lipid Panel  Lab Results  Component Value Date   CHOL 140 10/06/2019   HDL 42 (L) 10/06/2019   LDLCALC 80 10/06/2019   TRIG 98 10/06/2019   CHOLHDL 3.3 10/06/2019     Needs to increase exercise, otherwise at goal

## 2019-12-02 NOTE — Patient Instructions (Signed)
1. Please have labs done at [place]. We will contact your daughter, [name], with results as available.  2. As discussed today, we will be investigating cause for abnormal liver blood work (as requested by Dr. Moshe Cipro) and your iron deficiency anemia (requested by Dr. Theador Hawthorne).

## 2019-12-02 NOTE — Assessment & Plan Note (Signed)
Controlled, no change in medication  

## 2019-12-02 NOTE — Assessment & Plan Note (Signed)
New diagnosis will establish with cardiology for f/u . On chronic anti coagulation. Rate currently controlled

## 2019-12-02 NOTE — Assessment & Plan Note (Signed)
Obesity linked with hypertension , depression   Patient re-educated about  the importance of commitment to a  minimum of 150 minutes of exercise per week as able.  The importance of healthy food choices with portion control discussed, as well as eating regularly and within a 12 hour window most days. The need to choose "clean , green" food 50 to 75% of the time is discussed, as well as to make water the primary drink and set a goal of 64 ounces water daily.    Weight /BMI 12/02/2019 11/29/2019 11/09/2019  WEIGHT 220 lb 9.6 oz 203 lb 220 lb 6.4 oz  HEIGHT 5\' 5"  5\' 5"  5\' 5"   BMI 36.71 kg/m2 33.78 kg/m2 36.68 kg/m2

## 2019-12-02 NOTE — Assessment & Plan Note (Signed)
Managed by Endo and controlled 

## 2019-12-04 ENCOUNTER — Encounter: Payer: Self-pay | Admitting: Gastroenterology

## 2019-12-05 NOTE — Progress Notes (Signed)
Cc'ed to pcp °

## 2019-12-20 ENCOUNTER — Other Ambulatory Visit: Payer: Self-pay | Admitting: Family Medicine

## 2019-12-22 ENCOUNTER — Encounter: Payer: Self-pay | Admitting: Family Medicine

## 2019-12-22 ENCOUNTER — Ambulatory Visit (INDEPENDENT_AMBULATORY_CARE_PROVIDER_SITE_OTHER): Payer: Medicare Other | Admitting: Family Medicine

## 2019-12-22 ENCOUNTER — Other Ambulatory Visit: Payer: Self-pay

## 2019-12-22 VITALS — BP 131/70 | HR 99 | Temp 98.5°F | Ht 65.0 in | Wt 225.0 lb

## 2019-12-22 DIAGNOSIS — R300 Dysuria: Secondary | ICD-10-CM

## 2019-12-22 LAB — POCT URINALYSIS DIPSTICK
Bilirubin, UA: NEGATIVE
Glucose, UA: NEGATIVE
Ketones, UA: NEGATIVE
Nitrite, UA: NEGATIVE
Protein, UA: POSITIVE — AB
Spec Grav, UA: >=1.03 — AB (ref 1.010–1.025)
Urobilinogen, UA: 0.2 E.U./dL
pH, UA: 5.5 (ref 5.0–8.0)

## 2019-12-22 MED ORDER — SULFAMETHOXAZOLE-TRIMETHOPRIM 800-160 MG PO TABS: 1.0000 | ORAL_TABLET | Freq: Two times a day (BID) | ORAL | 0 refills | Status: AC

## 2019-12-22 MED ORDER — PHENAZOPYRIDINE HCL 100 MG PO TABS: 100.0000 mg | ORAL_TABLET | Freq: Three times a day (TID) | ORAL | 0 refills | Status: DC | PRN

## 2019-12-22 NOTE — Progress Notes (Signed)
Subjective:  Patient ID: [name], female    DOB: Aug 24, 1955  Age: 64 y.o. MRN: [id number]  CC:  Chief Complaint  Patient presents with   Dysuria    x1-2 weeks      HPI  [name] is a 64 year old female patient that presents today for acute visit due to dysuria.  Dysuria  This is a new problem. The current episode started 1 to 4 weeks ago. The problem occurs every urination. The problem has been unchanged. The quality of the pain is described as aching and burning. The pain is at a severity of 8/10. The pain is severe. There has been no fever. She is not sexually active. There is no history of pyelonephritis. Associated symptoms include frequency and urgency. Pertinent negatives include no discharge, flank pain, hematuria, hesitancy, nausea, possible pregnancy, sweats or vomiting. She has tried increased fluids for the symptoms. The treatment provided no relief.    Today patient denies signs and symptoms of COVID 19 infection including fever, chills, cough, shortness of breath, and headache. Past Medical, Surgical, Social History, Allergies, and Medications have been Reviewed.   Past Medical History:  Diagnosis Date   A-fib ([place])    Anemia    Depression    Dyspnea    Fractures    Hyperlipidemia    Hypertension    Mental retardation, mild (I.Q. 50-70)    lives alone and cares for self has a nurse aide comes once a day    Obesity    Pneumonia    PONV (postoperative nausea and vomiting)    Seizure ([place])    11/17/2012 "still having them; "I believe their from a tumor in my head" ((11/17/2012   Seizure disorder ([place])    Thyroid cancer ([place])     Current Meds  Medication Sig   acetaminophen (TYLENOL) 500 MG tablet Take 1 tablet (500 mg total) by mouth 2 (two) times daily. (Patient taking differently: Take 500 mg by mouth 2 (two) times daily as needed for mild pain or headache. )   amLODipine (NORVASC) 2.5 MG tablet Take 1 tablet (2.5 mg total) by  mouth daily.   apixaban (ELIQUIS) 5 MG TABS tablet Take 1 tablet (5 mg total) by mouth 2 (two) times daily.   benazepril (LOTENSIN) 20 MG tablet Take 1 tablet (20 mg total) by mouth daily.   calcitRIOL (ROCALTROL) 0.25 MCG capsule Take 0.25 mcg by mouth daily. \   diltiazem (CARDIZEM CD) 180 MG 24 hr capsule Take 1 capsule (180 mg total) by mouth daily.   diltiazem (TIAZAC) 180 MG 24 hr capsule Take by mouth.   ezetimibe (ZETIA) 10 MG tablet TAKE ONE TABLET BY MOUTH ONCE DAILY.   Fluoxetine HCl, PMDD, 10 MG CAPS Take one capsule by mouth once daily   LAMICTAL 150 MG tablet TAKE 1 TABLET BY MOUTH ONCE DAILY.   levETIRAcetam (KEPPRA) 750 MG tablet Two tablets twice daily (Patient taking differently: Take 1,500 mg by mouth 2 (two) times daily. )   levothyroxine (SYNTHROID) 125 MCG tablet Take 1 tablet (125 mcg total) by mouth daily before breakfast.   omeprazole (PRILOSEC) 20 MG capsule Take 1 capsule (20 mg total) by mouth daily.   potassium chloride SA (KLOR-CON) 20 MEQ tablet Take by mouth. One time daily    ROS:  Review of Systems  Gastrointestinal: Negative for nausea and vomiting.  Genitourinary: Positive for dysuria, frequency and urgency. Negative for flank pain, hematuria and hesitancy.  Objective:   Today's Vitals: BP 131/70 (BP Location: Right Arm, Patient Position: Sitting, Cuff Size: Large)    Pulse 99    Temp 98.5 F (36.9 C) (Tympanic)    Ht 5\' 5"  (1.651 m)    Wt 225 lb (102.1 kg)    SpO2 98%    BMI 37.44 kg/m  Vitals with BMI 12/22/2019 12/02/2019 11/29/2019  Height 5\' 5"  5\' 5"  5\' 5"   Weight 225 lbs 220 lbs 10 oz 203 lbs  BMI 37.44 23.76 28.31  Systolic 517 616 073  Diastolic 70 71 81  Pulse 99 105 91     Physical Exam Vitals and nursing note reviewed.  Constitutional:      Appearance: Normal appearance. She is well-developed and well-groomed. She is obese.  HENT:     Head: Normocephalic and atraumatic.     Right Ear: External ear normal.     Left  Ear: External ear normal.     Mouth/Throat:     Comments: Mask in place Eyes:     General:        Right eye: No discharge.        Left eye: No discharge.     Conjunctiva/sclera: Conjunctivae normal.  Cardiovascular:     Rate and Rhythm: Normal rate and regular rhythm.     Pulses: Normal pulses.     Heart sounds: Normal heart sounds.  Pulmonary:     Effort: Pulmonary effort is normal.     Breath sounds: Normal breath sounds.  Abdominal:     Tenderness: There is no right CVA tenderness or left CVA tenderness.  Musculoskeletal:        General: Normal range of motion.     Cervical back: Normal range of motion and neck supple.  Skin:    General: Skin is warm.  Neurological:     General: No focal deficit present.     Mental Status: She is alert and oriented to person, place, and time.  Psychiatric:        Attention and Perception: Attention normal.        Mood and Affect: Mood normal.        Speech: Speech normal.        Behavior: Behavior normal. Behavior is cooperative.        Thought Content: Thought content normal.        Cognition and Memory: Cognition normal.        Judgment: Judgment normal.     Assessment   1. Dysuria     Tests ordered Orders Placed This Encounter  Procedures   Urine Culture   POCT Urinalysis Dipstick     Plan: Please see assessment and plan per problem list above.   Meds ordered this encounter  Medications   phenazopyridine (PYRIDIUM) 100 MG tablet    Sig: Take 1 tablet (100 mg total) by mouth 3 (three) times daily as needed for pain.    Dispense:  10 tablet    Refill:  0    Order Specific Question:   Supervising Provider    Answer:   Tula Nakayama E [[id number]]   sulfamethoxazole-trimethoprim (BACTRIM DS) 800-160 MG tablet    Sig: Take 1 tablet by mouth 2 (two) times daily for 3 days.    Dispense:  6 tablet    Refill:  0    Order Specific Question:   Supervising Provider    Answer:   [name] [[id number]]    Patient to  follow-up in as  scheduled  Note: This dictation was prepared with Dragon dictation along with smaller phrase technology. Similar sounding words can be transcribed inadequately or may not be corrected upon review. Any transcriptional errors that result from this process are unintentional.      Perlie Mayo, NP

## 2019-12-22 NOTE — Assessment & Plan Note (Signed)
UTI noted, Cx ordered Tx provided Will follow up if medication is resistant

## 2019-12-22 NOTE — Patient Instructions (Signed)
°  HAPPY FALL!  I appreciate the opportunity to provide you with care for your health and wellness. Today we discussed: UTI  Follow up: 03/20/2020  No labs or referrals today  Please take medications as directed We hope you feel better soon.  Please continue to practice social distancing to keep you, your family, and our community safe.  If you must go out, please wear a mask and practice good handwashing.  It was a pleasure to see you and I look forward to continuing to work together on your health and well-being. Please do not hesitate to call the office if you need care or have questions about your care.  Have a wonderful day and week. With Gratitude, Cherly Beach, DNP, AGNP-BC

## 2019-12-23 ENCOUNTER — Ambulatory Visit (HOSPITAL_COMMUNITY)
Admission: RE | Admit: 2019-12-23 | Discharge: 2019-12-23 | Disposition: A | Discharge status: Home / Self Care | Payer: Medicare Other | Source: Ambulatory Visit | Attending: Family Medicine | Admitting: Family Medicine

## 2019-12-23 ENCOUNTER — Other Ambulatory Visit (HOSPITAL_COMMUNITY)
Admission: RE | Admit: 2019-12-23 | Discharge: 2019-12-23 | Disposition: A | Discharge status: Home / Self Care | Payer: Medicare Other | Source: Other Acute Inpatient Hospital | Attending: Family Medicine | Admitting: Family Medicine

## 2019-12-23 DIAGNOSIS — Z1231 Encounter for screening mammogram for malignant neoplasm of breast: Secondary | ICD-10-CM | POA: Insufficient documentation

## 2019-12-23 DIAGNOSIS — R300 Dysuria: Secondary | ICD-10-CM | POA: Diagnosis present

## 2019-12-26 ENCOUNTER — Other Ambulatory Visit (HOSPITAL_COMMUNITY): Payer: Self-pay | Admitting: Family Medicine

## 2019-12-26 DIAGNOSIS — R928 Other abnormal and inconclusive findings on diagnostic imaging of breast: Secondary | ICD-10-CM

## 2019-12-26 LAB — URINE CULTURE: Culture: >=100000 — AB

## 2019-12-31 ENCOUNTER — Other Ambulatory Visit: Payer: Self-pay | Admitting: Family Medicine

## 2019-12-31 MED ORDER — LAMOTRIGINE 150 MG PO TABS: 150.0000 mg | ORAL_TABLET | Freq: Every day | ORAL | 1 refills | Status: DC

## 2019-12-31 MED ORDER — LEVETIRACETAM 750 MG PO TABS: ORAL_TABLET | ORAL | 1 refills | Status: DC

## 2020-01-02 ENCOUNTER — Other Ambulatory Visit: Payer: Self-pay

## 2020-01-02 DIAGNOSIS — G40909 Epilepsy, unspecified, not intractable, without status epilepticus: Secondary | ICD-10-CM

## 2020-01-04 DIAGNOSIS — E785 Hyperlipidemia, unspecified: Secondary | ICD-10-CM

## 2020-01-04 DIAGNOSIS — R569 Unspecified convulsions: Secondary | ICD-10-CM | POA: Diagnosis not present

## 2020-01-04 DIAGNOSIS — F78A9 Other genetic related intellectual disability: Secondary | ICD-10-CM | POA: Diagnosis not present

## 2020-01-04 DIAGNOSIS — I4891 Unspecified atrial fibrillation: Secondary | ICD-10-CM | POA: Diagnosis not present

## 2020-01-04 DIAGNOSIS — I10 Essential (primary) hypertension: Secondary | ICD-10-CM | POA: Diagnosis not present

## 2020-01-09 ENCOUNTER — Ambulatory Visit: Payer: Medicare Other | Admitting: Cardiology

## 2020-01-09 NOTE — Progress Notes (Deleted)
Clinical Summary Ms. Goffredo is a 64 y.o.female  1. Afib - new diagnosis during 07/2019 admissio. She had partial nephrectomy 08/05/19, 6/18 had surgery for SBO and hernia repair. Had afib in postop period  08/14/19: EKG with afib RVR Past Medical History:  Diagnosis Date  . A-fib ([place])   . Anemia   . Depression   . Dyspnea   . Fractures   . Hyperlipidemia   . Hypertension   . Mental retardation, mild (I.Q. 50-70)    lives alone and cares for self has a nurse aide comes once a day   . Obesity   . Pneumonia   . PONV (postoperative nausea and vomiting)   . Seizure ([place])    11/17/2012 "still having them; "I believe their from a tumor in my head" ((11/17/2012  . Seizure disorder ([place])   . Thyroid cancer ([facility])      Allergies  Allergen Reactions  . Simvastatin Other (See Comments)    Muscle aches     Current Outpatient Medications  Medication Sig Dispense Refill  . acetaminophen (TYLENOL) 500 MG tablet Take 1 tablet (500 mg total) by mouth 2 (two) times daily. (Patient taking differently: Take 500 mg by mouth 2 (two) times daily as needed for mild pain or headache. ) 60 tablet 5  . amLODipine (NORVASC) 2.5 MG tablet Take 1 tablet (2.5 mg total) by mouth daily. 30 tablet 5  . apixaban (ELIQUIS) 5 MG TABS tablet Take 1 tablet (5 mg total) by mouth 2 (two) times daily. 60 tablet 3  . benazepril (LOTENSIN) 20 MG tablet Take 1 tablet (20 mg total) by mouth daily. 30 tablet 5  . calcitRIOL (ROCALTROL) 0.25 MCG capsule Take 0.25 mcg by mouth daily. \    . diltiazem (CARDIZEM CD) 180 MG 24 hr capsule Take 1 capsule (180 mg total) by mouth daily. 30 capsule 5  . diltiazem (TIAZAC) 180 MG 24 hr capsule Take by mouth.    . ezetimibe (ZETIA) 10 MG tablet TAKE ONE TABLET BY MOUTH ONCE DAILY. 30 tablet 5  . Fluoxetine HCl, PMDD, 10 MG CAPS Take one capsule by mouth once daily 30 capsule 5  . LAMICTAL 150 MG tablet TAKE 1 TABLET BY MOUTH ONCE DAILY. 30 tablet 0  . lamoTRIgine (LAMICTAL)  150 MG tablet Take 1 tablet (150 mg total) by mouth daily. 30 tablet 1  . levETIRAcetam (KEPPRA) 750 MG tablet Two tablets twice daily (Patient taking differently: Take 1,500 mg by mouth 2 (two) times daily. ) 120 tablet 3  . levETIRAcetam (KEPPRA) 750 MG tablet Take two tablets by mouth two times daily 120 tablet 1  . levothyroxine (SYNTHROID) 125 MCG tablet Take 1 tablet (125 mcg total) by mouth daily before breakfast. 90 tablet 1  . omeprazole (PRILOSEC) 20 MG capsule Take 1 capsule (20 mg total) by mouth daily. 30 capsule 5  . phenazopyridine (PYRIDIUM) 100 MG tablet Take 1 tablet (100 mg total) by mouth 3 (three) times daily as needed for pain. 10 tablet 0  . potassium chloride SA (KLOR-CON) 20 MEQ tablet Take by mouth. One time daily     No current facility-administered medications for this visit.     Past Surgical History:  Procedure Laterality Date  . ABDOMINAL HYSTERECTOMY  2001  . BREAST LUMPECTOMY  1996   right   . COLONOSCOPY N/A 05/16/2016   Dr. Oneida Alar: Redundant colon, hemorrhoids, next colonoscopy 10 years  . INGUINAL HERNIA REPAIR N/A 08/12/2019   Procedure:  laparoscopic incisional hernia repair;  Surgeon: Lucas Mallow, MD;  Location: [facility];  Service: Urology;  Laterality: N/A;  . ROBOTIC ASSITED PARTIAL NEPHRECTOMY Right 08/05/2019   Procedure: XI ROBOTIC ASSITED PARTIAL NEPHRECTOMY WITH INTRAOPERATIVE ULTRASOUND;  Surgeon: [name], MD;  Location: [facility];  Service: Urology;  Laterality: Right;  3 HRS  . THYROIDECTOMY Bilateral 11/17/2012   Procedure: TOTAL THYROIDECTOMY;  Surgeon: Ascencion Dike, MD;  Location: [facility];  Service: ENT;  Laterality: Bilateral;  . TOTAL THYROIDECTOMY Bilateral 11/17/2012  . TUBAL LIGATION       Allergies  Allergen Reactions  . Simvastatin Other (See Comments)    Muscle aches      Family History  Problem Relation Age of Onset  . Diabetes Mother   . Colon cancer Neg Hx      Social History Ms. Akopyan reports that she has  never smoked. She has never used smokeless tobacco. Ms. Kreuser reports no history of alcohol use.   Review of Systems CONSTITUTIONAL: No weight loss, fever, chills, weakness or fatigue.  HEENT: Eyes: No visual loss, blurred vision, double vision or yellow sclerae.No hearing loss, sneezing, congestion, runny nose or sore throat.  SKIN: No rash or itching.  CARDIOVASCULAR:  RESPIRATORY: No shortness of breath, cough or sputum.  GASTROINTESTINAL: No anorexia, nausea, vomiting or diarrhea. No abdominal pain or blood.  GENITOURINARY: No burning on urination, no polyuria NEUROLOGICAL: No headache, dizziness, syncope, paralysis, ataxia, numbness or tingling in the extremities. No change in bowel or bladder control.  MUSCULOSKELETAL: No muscle, back pain, joint pain or stiffness.  LYMPHATICS: No enlarged nodes. No history of splenectomy.  PSYCHIATRIC: No history of depression or anxiety.  ENDOCRINOLOGIC: No reports of sweating, cold or heat intolerance. No polyuria or polydipsia.  Marland Kitchen   Physical Examination There were no vitals filed for this visit. There were no vitals filed for this visit.  Gen: resting comfortably, no acute distress HEENT: no scleral icterus, pupils equal round and reactive, no palptable cervical adenopathy,  CV Resp: Clear to auscultation bilaterally GI: abdomen is soft, non-tender, non-distended, normal bowel sounds, no hepatosplenomegaly MSK: extremities are warm, no edema.  Skin: warm, no rash Neuro:  no focal deficits Psych: appropriate affect   Diagnostic Studies     Assessment and Plan        Arnoldo Lenis, M.D., F.A.C.C.

## 2020-01-12 ENCOUNTER — Other Ambulatory Visit: Payer: Self-pay

## 2020-01-12 ENCOUNTER — Ambulatory Visit (HOSPITAL_COMMUNITY)
Admission: RE | Admit: 2020-01-12 | Discharge: 2020-01-12 | Disposition: A | Discharge status: Home / Self Care | Payer: Medicare Other | Source: Ambulatory Visit | Attending: Family Medicine | Admitting: Family Medicine

## 2020-01-12 DIAGNOSIS — R928 Other abnormal and inconclusive findings on diagnostic imaging of breast: Secondary | ICD-10-CM

## 2020-01-18 ENCOUNTER — Other Ambulatory Visit: Payer: Self-pay | Admitting: Family Medicine

## 2020-02-01 ENCOUNTER — Other Ambulatory Visit: Payer: Self-pay | Admitting: Family Medicine

## 2020-02-15 DIAGNOSIS — I10 Essential (primary) hypertension: Secondary | ICD-10-CM | POA: Diagnosis not present

## 2020-02-15 DIAGNOSIS — I4891 Unspecified atrial fibrillation: Secondary | ICD-10-CM | POA: Diagnosis not present

## 2020-02-15 DIAGNOSIS — E785 Hyperlipidemia, unspecified: Secondary | ICD-10-CM | POA: Diagnosis not present

## 2020-02-15 DIAGNOSIS — R569 Unspecified convulsions: Secondary | ICD-10-CM | POA: Diagnosis not present

## 2020-02-16 ENCOUNTER — Other Ambulatory Visit: Payer: Self-pay

## 2020-02-16 DIAGNOSIS — R928 Other abnormal and inconclusive findings on diagnostic imaging of breast: Secondary | ICD-10-CM

## 2020-02-24 ENCOUNTER — Telehealth: Payer: Self-pay | Admitting: Gastroenterology

## 2020-02-24 NOTE — Telephone Encounter (Signed)
Please let pt know we are waiting for her to complete the labs ordered in 11/2019 so we can decide next step in work up of anemia and abnormal liver blood work.  You may have to reach out to sister, [name], due to patient's cognitive deficits.

## 2020-02-27 NOTE — Telephone Encounter (Signed)
noted 

## 2020-02-27 NOTE — Telephone Encounter (Signed)
Phoned and spoke with the pt's daughter [name] and I advised her that her mother needs to complete lab work from her visit in October. These labs are needed to decide the next step in her treatment. Pt's daughter advised me to reprint the labs and please mail to the pt and she will make sure she has them done. Labs reprinted and mailed to the pt.

## 2020-03-09 LAB — LIPID PANEL
Chol/HDL Ratio: 4.7 ratio — ABNORMAL HIGH (ref 0.0–4.4)
Cholesterol, Total: 238 mg/dL — ABNORMAL HIGH (ref 100–199)
HDL: 51 mg/dL (ref >39)
LDL Chol Calc (NIH): 172 mg/dL — ABNORMAL HIGH (ref 0–99)
Triglycerides: 84 mg/dL (ref 0–149)
VLDL Cholesterol Cal: 15 mg/dL (ref 5–40)

## 2020-03-09 LAB — CMP14+EGFR
ALT: 9 IU/L (ref 0–32)
AST: 15 IU/L (ref 0–40)
Albumin/Globulin Ratio: 1.2 (ref 1.2–2.2)
Albumin: 4.3 g/dL (ref 3.8–4.8)
Alkaline Phosphatase: 120 IU/L (ref 44–121)
BUN/Creatinine Ratio: 17 (ref 12–28)
BUN: 33 mg/dL — ABNORMAL HIGH (ref 8–27)
Bilirubin Total: 0.5 mg/dL (ref 0.0–1.2)
CO2: 19 mmol/L — ABNORMAL LOW (ref 20–29)
Calcium: 10.4 mg/dL — ABNORMAL HIGH (ref 8.7–10.3)
Chloride: 105 mmol/L (ref 96–106)
Creatinine, Ser: 1.89 mg/dL — ABNORMAL HIGH (ref 0.57–1.00)
GFR calc Af Amer: 32 mL/min/{1.73_m2} — ABNORMAL LOW (ref >59)
GFR calc non Af Amer: 27 mL/min/{1.73_m2} — ABNORMAL LOW (ref >59)
Globulin, Total: 3.5 g/dL (ref 1.5–4.5)
Glucose: 100 mg/dL — ABNORMAL HIGH (ref 65–99)
Potassium: 4.2 mmol/L (ref 3.5–5.2)
Sodium: 141 mmol/L (ref 134–144)
Total Protein: 7.8 g/dL (ref 6.0–8.5)

## 2020-03-09 NOTE — Telephone Encounter (Signed)
Noted. I have reviewed chart again and she did have some labs with Dr. Dorris Fetch yesterday but our labs were not done. Her LFTs are now normal.  I reviewed care everywhere and her iron studies and cbc have been repeated and look good.  Really the only labs we still need are B12 and folate panel. We can hold off on all the other labs (INR, anti-smooth muscle, mitochondrial antibodies, ceruloplasmin, ANA, IgG, IgA, IgM, CBC, c-Met)  since her LFTs are now normal and the others were recently done by other providers.    Let's also bring her back in for follow up office visit, nonurgent, to complete work up. It's been three months since we saw her at this point.

## 2020-03-13 ENCOUNTER — Ambulatory Visit: Payer: Medicare Other | Admitting: "Endocrinology

## 2020-03-14 ENCOUNTER — Encounter: Payer: Self-pay | Admitting: Internal Medicine

## 2020-03-14 LAB — THYROGLOBULIN LEVEL: Thyroglobulin (TG-RIA): <2 ng/mL

## 2020-03-14 LAB — TSH: TSH: 0.538 u[IU]/mL (ref 0.450–4.500)

## 2020-03-14 LAB — T4, FREE: Free T4: 1.76 ng/dL (ref 0.82–1.77)

## 2020-03-15 ENCOUNTER — Other Ambulatory Visit: Payer: Self-pay

## 2020-03-15 ENCOUNTER — Encounter: Payer: Self-pay | Admitting: "Endocrinology

## 2020-03-15 ENCOUNTER — Ambulatory Visit (INDEPENDENT_AMBULATORY_CARE_PROVIDER_SITE_OTHER): Payer: Medicare Other | Admitting: "Endocrinology

## 2020-03-15 VITALS — BP 127/76 | HR 104 | Ht 65.0 in | Wt 223.4 lb

## 2020-03-15 DIAGNOSIS — E890 Postprocedural hypothyroidism: Secondary | ICD-10-CM | POA: Diagnosis not present

## 2020-03-15 DIAGNOSIS — C73 Malignant neoplasm of thyroid gland: Secondary | ICD-10-CM | POA: Diagnosis not present

## 2020-03-15 NOTE — Progress Notes (Signed)
03/15/2020      Endocrinology follow-up note     Subjective:    Patient ID: [name], female    DOB: 10-05-1955,    Past Medical History:  Diagnosis Date  . A-fib ([place])   . Anemia   . Depression   . Dyspnea   . Fractures   . Hyperlipidemia   . Hypertension   . Mental retardation, mild (I.Q. 50-70)    lives alone and cares for self has a nurse aide comes once a day   . Obesity   . Pneumonia   . PONV (postoperative nausea and vomiting)   . Seizure ([place])    11/17/2012 "still having them; "I believe their from a tumor in my head" ((11/17/2012  . Seizure disorder ([place])   . Thyroid cancer [facility])    Past Surgical History:  Procedure Laterality Date  . ABDOMINAL HYSTERECTOMY  2001  . BREAST LUMPECTOMY  1996   right   . COLONOSCOPY N/A 05/16/2016   Dr. Oneida Alar: Redundant colon, hemorrhoids, next colonoscopy 10 years  . INGUINAL HERNIA REPAIR N/A 08/12/2019   Procedure: laparoscopic incisional hernia repair;  Surgeon: Lucas Mallow, MD;  Location: [facility];  Service: Urology;  Laterality: N/A;  . ROBOTIC ASSITED PARTIAL NEPHRECTOMY Right 08/05/2019   Procedure: XI ROBOTIC ASSITED PARTIAL NEPHRECTOMY WITH INTRAOPERATIVE ULTRASOUND;  Surgeon: [name], MD;  Location: [facility];  Service: Urology;  Laterality: Right;  3 HRS  . THYROIDECTOMY Bilateral 11/17/2012   Procedure: TOTAL THYROIDECTOMY;  Surgeon: Ascencion Dike, MD;  Location: [facility];  Service: ENT;  Laterality: Bilateral;  . TOTAL THYROIDECTOMY Bilateral 11/17/2012  . TUBAL LIGATION     Social History   Socioeconomic History  . Marital status: Single    Spouse name: Not on file  . Number of children: 2  . Years of education: Not on file  . Highest education level: Not on file  Occupational History  . Occupation: disabled   Tobacco Use  . Smoking status: Never Smoker  . Smokeless tobacco: Never Used  Vaping Use  . Vaping Use: Never used  Substance and Sexual Activity  . Alcohol use: No  . Drug use: No  .  Sexual activity: Never  Other Topics Concern  . Not on file  Social History Narrative  . Not on file   Social Determinants of Health   Financial Resource Strain: Not on file  Food Insecurity: Not on file  Transportation Needs: Not on file  Physical Activity: Not on file  Stress: Not on file  Social Connections: Not on file   Outpatient Encounter Medications as of 03/15/2020  Medication Sig  . acetaminophen (TYLENOL) 500 MG tablet Take 1 tablet (500 mg total) by mouth 2 (two) times daily. (Patient taking differently: Take 500 mg by mouth 2 (two) times daily as needed for mild pain or headache. )  . amLODipine (NORVASC) 2.5 MG tablet Take 1 tablet (2.5 mg total) by mouth daily.  . benazepril (LOTENSIN) 20 MG tablet Take 1 tablet (20 mg total) by mouth daily.  . calcitRIOL (ROCALTROL) 0.25 MCG capsule Take 0.25 mcg by mouth daily. \  . diltiazem (CARDIZEM CD) 180 MG 24 hr capsule Take 1 capsule (180 mg total) by mouth daily.  Marland Kitchen diltiazem (TIAZAC) 180 MG 24 hr capsule Take by mouth.  Arne Cleveland 5 MG TABS tablet TAKE 1 TABLET BY MOUTH TWICE DAILY.  Marland Kitchen ezetimibe (ZETIA) 10 MG tablet TAKE ONE TABLET BY MOUTH ONCE DAILY.  Marland Kitchen  Fluoxetine HCl, PMDD, 10 MG CAPS Take one capsule by mouth once daily  . KEPPRA 750 MG tablet TAKE (2) TABLETS BY MOUTH TWICE DAILY.  Marland Kitchen LAMICTAL 150 MG tablet TAKE 1 TABLET BY MOUTH ONCE DAILY.  Marland Kitchen levETIRAcetam (KEPPRA) 750 MG tablet Two tablets twice daily (Patient taking differently: Take 1,500 mg by mouth 2 (two) times daily. )  . levothyroxine (SYNTHROID) 125 MCG tablet Take 1 tablet (125 mcg total) by mouth daily before breakfast.  . omeprazole (PRILOSEC) 20 MG capsule Take 1 capsule (20 mg total) by mouth daily.  . phenazopyridine (PYRIDIUM) 100 MG tablet Take 1 tablet (100 mg total) by mouth 3 (three) times daily as needed for pain.  . potassium chloride SA (KLOR-CON) 20 MEQ tablet Take by mouth. One time daily  . [DISCONTINUED] LAMICTAL 150 MG tablet TAKE 1 TABLET BY  MOUTH ONCE DAILY.  . [DISCONTINUED] LAMICTAL 150 MG tablet TAKE 1 TABLET BY MOUTH ONCE DAILY.   No facility-administered encounter medications on file as of 03/15/2020.   ALLERGIES: Allergies  Allergen Reactions  . Simvastatin Other (See Comments)    Muscle aches   VACCINATION STATUS: Immunization History  Administered Date(s) Administered  . Fluad Quad(high Dose 65+) 11/29/2019  . H1N1 12/16/2007  . Influenza Split 11/24/2011  . Influenza Whole 11/30/2006, 12/07/2008, 12/26/2009  . Influenza,inj,Quad PF,6+ Mos 11/18/2012, 12/06/2013, 05/23/2015, 01/29/2016, 12/01/2016, 01/18/2018, 12/01/2018  . Pneumococcal Conjugate-13 04/03/2014  . Pneumococcal Polysaccharide-23 05/10/2019  . Td 08/22/2003  . Tdap 04/02/2016  . Unspecified SARS-COV-2 Vaccination 05/30/2019    HPI  65 year old female patient with medical history as above.  She is here to follow-up for post surgical hypothyroidism and for follow-up of Papillary  thyroid cancer. 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 She has had  Thyrogen stimulated remnant ablation with negative post therapy WBS in 2014.  - Second Thyrogen Stimulated whole body scan for surveillance shows no scintigraphic evidence of iodine avid metastasis papillary thyroid cancer on 04/18/2016.   - Most recent surveillance imaging with Thyrogen stimulated whole-body scan on September 27, 2018 showed no evidence of tumor recurrence.  -Her ultrasound from November 07, 2019 was consistent with surgical changes with no  New sign of recurrence or residual tumor.  Her last thyroid imaging on November 07, 2019 also confirmed stable atrophic heterogenous residual right thyroid lobe measuring 2.5 cm x 1.2 cm.  Stability documented since 2019.     She is now on Synthroid 125 mcg p.o. daily before breakfast.  -She has steady weight since  last visit.  She has no new complaints.  Her previsit labs for thyroid function tests are consistent with appropriate replacement.    she denies palpitations. she denies dysphagia, SOB, nor voice change. she denies family history of thyroid cancer.  Review of systems: Limited as above.  Objective:    BP 127/76   Pulse (!) 104   Ht 5\' 5"  (1.651 m)   Wt 223 lb 6.4 oz (101.3 kg)   BMI 37.18 kg/m   Wt Readings from Last 3 Encounters:  03/15/20 223 lb 6.4 oz (101.3 kg)  12/22/19 225 lb (102.1 kg)  12/02/19 220 lb 9.6 oz (100.1 kg)     Physical Exam- Limited  Constitutional:  Body mass index is 37.18 kg/m. , not in acute distress, normal state of mind Eyes:  EOMI, no exophthalmos Neck: supple Thyroid: + Post Thyroidectomy scar.  Respiratory: Adequate breathing efforts Musculoskeletal: no gross deformities, strength intact in all four extremities, no gross restriction of joint movements Skin:  no rashes, no hyperemia Neurological: no tremor with outstretched hands,    Results for orders placed or performed during the hospital encounter of 12/23/19  Culture, Urine   Specimen: Urine, Clean Catch  Result Value Ref Range   Specimen Description      URINE, CLEAN CATCH Performed at [facility], [address], [place], [place] [address]    Special Requests      NONE Performed at [facility], [address], [place], [place] [address]    Culture >=100,000 COLONIES/mL ENTEROBACTER CLOACAE (A)    Report Status 12/26/2019 FINAL    Organism ID, Bacteria ENTEROBACTER CLOACAE (A)       Susceptibility   Enterobacter cloacae - MIC*    CEFAZOLIN >=64 RESISTANT Resistant     CIPROFLOXACIN <=0.25 SENSITIVE Sensitive     GENTAMICIN <=1 SENSITIVE Sensitive     IMIPENEM 0.5 SENSITIVE Sensitive     NITROFURANTOIN 64 INTERMEDIATE Intermediate     TRIMETH/SULFA <=20 SENSITIVE Sensitive     PIP/TAZO 32 INTERMEDIATE Intermediate     * >=100,000 COLONIES/mL ENTEROBACTER CLOACAE   Complete  Blood Count (Most recent): Lab Results  Component Value Date   WBC 6.1 10/06/2019   HGB 10.6 (L) 10/06/2019   HCT 32.6 (L) 10/06/2019   MCV 86.0 10/06/2019   PLT 233 10/06/2019   Diabetic Labs (most recent): Lab Results  Component Value Date   HGBA1C 5.2 05/26/2017   HGBA1C 5.3 08/10/2012   Lipid Panel     Component Value Date/Time   CHOL 238 (H) 03/08/2020 0903   TRIG 84 03/08/2020 0903   HDL 51 03/08/2020 0903   CHOLHDL 4.7 (H) 03/08/2020 0903   CHOLHDL 3.3 10/06/2019 0951   VLDL 14 01/29/2016 1109   LDLCALC 172 (H) 03/08/2020 0903   LDLCALC 80 10/06/2019 0951    June 01, 2017  IMPRESSION:  Questioned approximately 2.2 cm soft tissue within the right lobectomy resection bed appears similar to the 07/2015 examination.  Note, while nonspecific, this tissue was apparently present at the time of the Thyrogen stimulated I 131 nuclear medicine whole body scan performed 03/2016 which was negative for the presence of iodine avid metastatic thyroid cancer or residual disease, and thus favored to be benign etiology. Clinical correlation is advised.  Thyrogen stimulated whole-body scan September 27, 2018 fINDINGS:  Excreted tracer within colon and urinary bladder. Small amount of retained gastric activity. Pattern is similar to that seen on the previous exam.  No sites of iodine-avid metastatic thyroid cancer identified. No residual tracer accumulation in the neck.  IMPRESSION: No scintigraphic evidence of iodine-avid metastatic thyroid cancer.    Ultrasound of thyroid on November 07, 2019 -Right lobe 2.5 x 1.2 x 0.9 cm, left lobe surgically absent. Stable atrophic heterogeneous residual right thyroid lobe as before. No hypervascularity. No left thyroid bed abnormality. No regional adenopathy.  IMPRESSION: Stable thyroid ultrasound compared 2019 as above.    Assessment & Plan:   1. Hypothyroidism, postsurgical -Her repeat thyroid function tests are consistent  with appropriate replacement.  She is advised to continue levothyroxine 125 mcg p.o. daily before breakfast.     - We discussed about the correct intake of her thyroid hormone, on empty stomach at fasting, with water, separated by at least 30 minutes from breakfast and other medications,  and separated by more than 4 hours from calcium, iron, multivitamins,  acid reflux medications (PPIs). -Patient is made aware of the fact that thyroid hormone replacement is needed for life, dose to be adjusted by periodic monitoring of thyroid function tests.   - Target TSH for her would be between 0.1-0.5.  2. Papillary thyroid carcinoma ([place]) Her  surveillance neck /thyroid u/s from 11/17/2013 was c/w surgically absent thyroid, however there is a 0.9 cm left cervical lymph node. See below.  Pt is s/p thyrogen stimulated thyroid remnant ablation with WBS on 01/14/2013 with no evidence of iodine avid metastasis. She has had pT2,Nx,Mx FVPTC, s/p near total thyroidectomy on September 24,2014. She is counseled to maintain high degree of compliance with synthroid therapy with aim of keeping TSH low normal ( 0.1-0.5).  -she is advised on the necessity of follow-ups and imaging studies at least one annually for the next 5 years. - She was sent for core biopsy of 0.9 cm left cervical lymph node last visit. However, this procedure was abandoned due to " no evidence of adenopathy".  The previously noticed  left cervical lymph node was sent to decrease in size to 0.5 cm. Medicaid did not cover surveillance ultrasound. - Second Thyrogen estimated or body scan for surveillance shows no scintigraphic evidence of iodine avid metastasis papillary thyroid cancer on 04/18/2016. -June 01, 2017 surveillance thyroid/neck ultrasound : Unremarkable, see above.   -She underwent  Thyrogen stimulated whole body scan which is negative for tumor recurrence.   Her recent thyroid/neck ultrasound from September 2021 showed stable atrophic  heterogenous residual thyroid lobe measuring 2.5 x1.2 cm observed to have been stable since 2019.  She will not need any immediate intervention for this at this time.   She will be considered for another thyroid ultrasound after her next visit.    She is advised to keep close follow-up with Dr. Tula Nakayama for primary care needs.     - Time spent on this patient care encounter:  20 minutes of which 50% was spent in  counseling and the rest reviewing  her current and  previous labs / studies and medications  doses and developing a plan for long term care. Delrae Alfred Hawbaker  participated in the discussions, expressed understanding, and voiced agreement with the above plans.  All questions were answered to her satisfaction. she is encouraged to contact clinic should she have any questions or concerns prior to her return visit.   Follow up plan: Return in about 6 months (around 09/12/2020) for F/U with Pre-visit Labs.  Glade Lloyd, MD Phone: [phone]  Fax: [phone]  -  This note was partially dictated with voice recognition software. Similar sounding words can be transcribed inadequately or may not  be corrected upon review.   03/15/2020, 2:52 PM

## 2020-03-16 ENCOUNTER — Encounter: Payer: Self-pay | Admitting: Cardiology

## 2020-03-16 ENCOUNTER — Ambulatory Visit (INDEPENDENT_AMBULATORY_CARE_PROVIDER_SITE_OTHER): Payer: Medicare Other | Admitting: Cardiology

## 2020-03-16 VITALS — BP 118/84 | HR 100 | Ht 65.0 in | Wt 221.6 lb

## 2020-03-16 DIAGNOSIS — E782 Mixed hyperlipidemia: Secondary | ICD-10-CM

## 2020-03-16 DIAGNOSIS — I480 Paroxysmal atrial fibrillation: Secondary | ICD-10-CM | POA: Diagnosis not present

## 2020-03-16 DIAGNOSIS — I10 Essential (primary) hypertension: Secondary | ICD-10-CM

## 2020-03-16 NOTE — Patient Instructions (Signed)
Your physician wants you to follow-up in: [place] will receive a reminder letter in the mail two months in advance. If you don't receive a letter, please call our office to schedule the follow-up appointment.  Your physician has recommended you make the following change in your medication:   STOP AMLODIPINE   Thank you for choosing [facility]!!

## 2020-03-16 NOTE — Progress Notes (Signed)
Clinical Summary Ms. Weems is a 65 y.o.female seen today as a new consult, referred by Dr Moshe Cipro for the following medical problems.   1. PAF - appears to be new diagnosis 07/2019 during admission with SBO - she is on diltiazem, eliquis - no palpitations - compliant with meds  2. HTN - compliant with meds  3. Hyperlipidemia - Jan 2022 TC 238 TG 84 HDL 51 LDL 172 - muscle aches on simvastatin  Past Medical History:  Diagnosis Date  . A-fib ([place])   . Anemia   . Depression   . Dyspnea   . Fractures   . Hyperlipidemia   . Hypertension   . Mental retardation, mild (I.Q. 50-70)    lives alone and cares for self has a nurse aide comes once a day   . Obesity   . Pneumonia   . PONV (postoperative nausea and vomiting)   . Seizure ([place])    11/17/2012 "still having them; "I believe their from a tumor in my head" ((11/17/2012  . Seizure disorder ([place])   . Thyroid cancer ([facility])      Allergies  Allergen Reactions  . Simvastatin Other (See Comments)    Muscle aches     Current Outpatient Medications  Medication Sig Dispense Refill  . acetaminophen (TYLENOL) 500 MG tablet Take 1 tablet (500 mg total) by mouth 2 (two) times daily. (Patient taking differently: Take 500 mg by mouth 2 (two) times daily as needed for mild pain or headache. ) 60 tablet 5  . amLODipine (NORVASC) 2.5 MG tablet Take 1 tablet (2.5 mg total) by mouth daily. 30 tablet 5  . benazepril (LOTENSIN) 20 MG tablet Take 1 tablet (20 mg total) by mouth daily. 30 tablet 5  . calcitRIOL (ROCALTROL) 0.25 MCG capsule Take 0.25 mcg by mouth daily. \    . diltiazem (CARDIZEM CD) 180 MG 24 hr capsule Take 1 capsule (180 mg total) by mouth daily. 30 capsule 5  . diltiazem (TIAZAC) 180 MG 24 hr capsule Take by mouth.    Arne Cleveland 5 MG TABS tablet TAKE 1 TABLET BY MOUTH TWICE DAILY. 60 tablet 0  . ezetimibe (ZETIA) 10 MG tablet TAKE ONE TABLET BY MOUTH ONCE DAILY. 30 tablet 5  . Fluoxetine HCl, PMDD, 10 MG CAPS Take one  capsule by mouth once daily 30 capsule 5  . KEPPRA 750 MG tablet TAKE (2) TABLETS BY MOUTH TWICE DAILY. 120 tablet 0  . LAMICTAL 150 MG tablet TAKE 1 TABLET BY MOUTH ONCE DAILY. 30 tablet 0  . levETIRAcetam (KEPPRA) 750 MG tablet Two tablets twice daily (Patient taking differently: Take 1,500 mg by mouth 2 (two) times daily. ) 120 tablet 3  . levothyroxine (SYNTHROID) 125 MCG tablet Take 1 tablet (125 mcg total) by mouth daily before breakfast. 90 tablet 1  . omeprazole (PRILOSEC) 20 MG capsule Take 1 capsule (20 mg total) by mouth daily. 30 capsule 5  . phenazopyridine (PYRIDIUM) 100 MG tablet Take 1 tablet (100 mg total) by mouth 3 (three) times daily as needed for pain. 10 tablet 0  . potassium chloride SA (KLOR-CON) 20 MEQ tablet Take by mouth. One time daily     No current facility-administered medications for this visit.     Past Surgical History:  Procedure Laterality Date  . ABDOMINAL HYSTERECTOMY  2001  . BREAST LUMPECTOMY  1996   right   . COLONOSCOPY N/A 05/16/2016   Dr. Oneida Alar: Redundant colon, hemorrhoids, next colonoscopy 10 years  .  INGUINAL HERNIA REPAIR N/A 08/12/2019   Procedure: laparoscopic incisional hernia repair;  Surgeon: Lucas Mallow, MD;  [facility];  Service: Urology;  Laterality: N/A;  . ROBOTIC ASSITED PARTIAL NEPHRECTOMY Right 08/05/2019   Procedure: XI ROBOTIC ASSITED PARTIAL NEPHRECTOMY WITH INTRAOPERATIVE ULTRASOUND;  Surgeon: [name], MD;  [facility];  Service: Urology;  Laterality: Right;  3 HRS  . THYROIDECTOMY Bilateral 11/17/2012   Procedure: TOTAL THYROIDECTOMY;  Surgeon: Ascencion Dike, MD;  Location: [facility];  Service: ENT;  Laterality: Bilateral;  . TOTAL THYROIDECTOMY Bilateral 11/17/2012  . TUBAL LIGATION       Allergies  Allergen Reactions  . Simvastatin Other (See Comments)    Muscle aches      Family History  Problem Relation Age of Onset  . Diabetes Mother   . Colon cancer Neg Hx      Social History Ms.  Levett reports that she has never smoked. She has never used smokeless tobacco. Ms. Achey reports no history of alcohol use.   Review of Systems CONSTITUTIONAL: No weight loss, fever, chills, weakness or fatigue.  HEENT: Eyes: No visual loss, blurred vision, double vision or yellow sclerae.No hearing loss, sneezing, congestion, runny nose or sore throat.  SKIN: No rash or itching.  CARDIOVASCULAR: per hpi RESPIRATORY: No shortness of breath, cough or sputum.  GASTROINTESTINAL: No anorexia, nausea, vomiting or diarrhea. No abdominal pain or blood.  GENITOURINARY: No burning on urination, no polyuria NEUROLOGICAL: No headache, dizziness, syncope, paralysis, ataxia, numbness or tingling in the extremities. No change in bowel or bladder control.  MUSCULOSKELETAL: No muscle, back pain, joint pain or stiffness.  LYMPHATICS: No enlarged nodes. No history of splenectomy.  PSYCHIATRIC: No history of depression or anxiety.  ENDOCRINOLOGIC: No reports of sweating, cold or heat intolerance. No polyuria or polydipsia.  Marland Kitchen   Physical Examination Today's Vitals   03/16/20 0929  BP: 118/84  Pulse: 100  SpO2: 98%  Weight: 221 lb 9.6 oz (100.5 kg)  Height: 5\' 5"  (1.651 m)   Body mass index is 36.88 kg/m.  Gen: resting comfortably, no acute distress HEENT: no scleral icterus, pupils equal round and reactive, no palptable cervical adenopathy,  CV: RRR, 2/6 systolic murmur rusb, no jvd Resp: Clear to auscultation bilaterally GI: abdomen is soft, non-tender, non-distended, normal bowel sounds, no hepatosplenomegaly MSK: extremities are warm, no edema.  Skin: warm, no rash Neuro:  no focal deficits Psych: appropriate affect   Diagnostic Studies 07/2019 echo IMPRESSIONS    1. Hyperdynamic LV systolic function; elevated mean gradient across  aortic valve (19 mmHg) suggests mild AS; however hyperdynamic LV function  at least partially contributing; trace AI; mild TR with severely elevated   pulmonary pressure.  2. Left ventricular ejection fraction, by estimation, is >75%. The left  ventricle has hyperdynamic function. The left ventricle has no regional  wall motion abnormalities. Left ventricular diastolic parameters were  normal.  3. Right ventricular systolic function is normal. The right ventricular  size is normal. There is severely elevated pulmonary artery systolic  pressure.  4. The mitral valve is normal in structure. Trivial mitral valve  regurgitation. No evidence of mitral stenosis.  5. The aortic valve is tricuspid. Aortic valve regurgitation is trivial.  Mild aortic valve stenosis.  6. The inferior vena cava is normal in size with greater than 50%  respiratory variability, suggesting right atrial pressure of 3 mmHg.    Assessment and Plan  1. PAF - appears episodes mainly during prior  admission with SBO - denies any symptoms, EKG today shows NSR - continue current meds, if not clear recurrence at follow ups could consider outpatient monitor to see if isolated events during her prior hospital admission. Would continue dilt and eliquis for now  2. HTN - would d/c norvasc since on diltiazem  3. Hyperlipidemia - defer to pcp - LDL elevated, muscle aches on simvastatin in the past, now on zetia 10mg  daily. Could consider trial of crestor 5mg  daily     Arnoldo Lenis, M.D.

## 2020-03-19 ENCOUNTER — Other Ambulatory Visit: Payer: Self-pay | Admitting: Family Medicine

## 2020-03-19 NOTE — Telephone Encounter (Signed)
FYI: Phoned and spoke with the pt's daughter and advised of the lab results we have from Dr. Dorris Fetch, but pt is still needing the B12 and Folate panel. Advised the paperwork had already been put in the mail to the pt. Diane (pt's daughter ) advises she will call her mother's nurse and advise them of taking her for her labs.

## 2020-03-19 NOTE — Telephone Encounter (Signed)
noted 

## 2020-03-20 ENCOUNTER — Encounter: Payer: Self-pay | Admitting: Family Medicine

## 2020-03-20 ENCOUNTER — Other Ambulatory Visit: Payer: Self-pay

## 2020-03-20 ENCOUNTER — Other Ambulatory Visit: Payer: Self-pay | Admitting: Family Medicine

## 2020-03-20 ENCOUNTER — Telehealth (INDEPENDENT_AMBULATORY_CARE_PROVIDER_SITE_OTHER): Payer: Medicare Other | Admitting: Family Medicine

## 2020-03-20 ENCOUNTER — Other Ambulatory Visit: Payer: Self-pay | Admitting: Physician Assistant

## 2020-03-20 DIAGNOSIS — I10 Essential (primary) hypertension: Secondary | ICD-10-CM

## 2020-03-20 DIAGNOSIS — R928 Other abnormal and inconclusive findings on diagnostic imaging of breast: Secondary | ICD-10-CM

## 2020-03-20 NOTE — Progress Notes (Signed)
Unable to contact pt. 

## 2020-03-30 DIAGNOSIS — I4891 Unspecified atrial fibrillation: Secondary | ICD-10-CM

## 2020-03-30 DIAGNOSIS — E785 Hyperlipidemia, unspecified: Secondary | ICD-10-CM

## 2020-03-30 DIAGNOSIS — R569 Unspecified convulsions: Secondary | ICD-10-CM | POA: Diagnosis not present

## 2020-03-30 DIAGNOSIS — I10 Essential (primary) hypertension: Secondary | ICD-10-CM

## 2020-04-13 ENCOUNTER — Other Ambulatory Visit: Payer: Self-pay | Admitting: Family Medicine

## 2020-04-13 ENCOUNTER — Other Ambulatory Visit: Payer: Self-pay | Admitting: "Endocrinology

## 2020-04-16 ENCOUNTER — Ambulatory Visit
Admission: RE | Admit: 2020-04-16 | Discharge: 2020-04-16 | Disposition: A | Discharge status: Home / Self Care | Payer: Medicare Other | Source: Ambulatory Visit | Attending: Family Medicine | Admitting: Family Medicine

## 2020-04-16 ENCOUNTER — Other Ambulatory Visit (HOSPITAL_COMMUNITY): Payer: Self-pay | Admitting: Diagnostic Radiology

## 2020-04-16 ENCOUNTER — Other Ambulatory Visit: Payer: Self-pay

## 2020-04-16 DIAGNOSIS — R928 Other abnormal and inconclusive findings on diagnostic imaging of breast: Secondary | ICD-10-CM

## 2020-04-16 HISTORY — PX: BREAST BIOPSY: SHX20

## 2020-04-19 ENCOUNTER — Other Ambulatory Visit: Payer: Self-pay | Admitting: Family Medicine

## 2020-04-25 ENCOUNTER — Encounter: Payer: Self-pay | Admitting: Internal Medicine

## 2020-04-25 ENCOUNTER — Ambulatory Visit: Payer: Medicare Other | Admitting: Gastroenterology

## 2020-05-17 ENCOUNTER — Other Ambulatory Visit: Payer: Self-pay | Admitting: Family Medicine

## 2020-05-28 DIAGNOSIS — R569 Unspecified convulsions: Secondary | ICD-10-CM | POA: Diagnosis not present

## 2020-05-28 DIAGNOSIS — I4891 Unspecified atrial fibrillation: Secondary | ICD-10-CM | POA: Diagnosis not present

## 2020-05-28 DIAGNOSIS — I10 Essential (primary) hypertension: Secondary | ICD-10-CM | POA: Diagnosis not present

## 2020-05-28 DIAGNOSIS — E785 Hyperlipidemia, unspecified: Secondary | ICD-10-CM | POA: Diagnosis not present

## 2020-05-31 ENCOUNTER — Other Ambulatory Visit: Payer: Self-pay | Admitting: Family Medicine

## 2020-06-12 NOTE — Progress Notes (Signed)
Subjective:   EMUNAH TEXIDOR is a 65 y.o. female who presents for an Initial Medicare Annual Wellness Visit.  I connected with Yanisa Goodgame today by telephone and verified that I am speaking with the correct person using two identifiers. Location patient: home Location provider: work Persons participating in the virtual visit: patient, provider.   I discussed the limitations, risks, security and privacy concerns of performing an evaluation and management service by telephone and the availability of in person appointments. I also discussed with the patient that there may be a patient responsible charge related to this service. The patient expressed understanding and verbally consented to this telephonic visit.    Interactive audio and video telecommunications were attempted between this provider and patient, however failed, due to patient having technical difficulties OR patient did not have access to video capability.  We continued and completed visit with audio only.      Review of Systems    N/A  Cardiac Risk Factors include: advanced age (>10men, >52 women);hypertension;dyslipidemia     Objective:    Today's Vitals   There is no height or weight on file to calculate BMI.  Advanced Directives 06/13/2020 08/13/2019 08/05/2019 07/29/2019 05/02/2019 04/03/2018 04/02/2018  Does Patient Have a Medical Advance Directive? No No No No No No No  Would patient like information on creating a medical advance directive? No - Patient declined No - Patient declined No - Patient declined No - Patient declined - No - Patient declined No - Patient declined    Current Medications (verified) Outpatient Encounter Medications as of 06/13/2020  Medication Sig  . acetaminophen (TYLENOL) 500 MG tablet Take 1 tablet (500 mg total) by mouth 2 (two) times daily. (Patient taking differently: Take 500 mg by mouth 2 (two) times daily as needed for mild pain or headache.)  . amLODipine (NORVASC) 2.5 MG tablet TAKE 1  TABLET BY MOUTH ONCE DAILY.  . benazepril (LOTENSIN) 20 MG tablet TAKE (1) TABLET BY MOUTH ONCE DAILY.  Marland Kitchen diltiazem (CARDIZEM CD) 180 MG 24 hr capsule TAKE 1 CAPSULE BY MOUTH ONCE DAILY.  Marland Kitchen ELIQUIS 5 MG TABS tablet TAKE 1 TABLET BY MOUTH TWICE DAILY.  Marland Kitchen ezetimibe (ZETIA) 10 MG tablet TAKE ONE TABLET BY MOUTH ONCE DAILY.  Marland Kitchen FLUoxetine (PROZAC) 10 MG capsule TAKE 1 CAPSULE BY MOUTH ONCE A DAY.  Marland Kitchen KEPPRA 750 MG tablet TAKE (2) TABLETS BY MOUTH TWICE DAILY.  Marland Kitchen LAMICTAL 150 MG tablet TAKE 1 TABLET BY MOUTH ONCE DAILY.  Marland Kitchen levothyroxine (SYNTHROID) 125 MCG tablet TAKE ONE TABLET BY MOUTH ONCE DAILY.  Marland Kitchen omeprazole (PRILOSEC) 20 MG capsule TAKE ONE CAPSULE BY MOUTH ONCE DAILY.  Marland Kitchen potassium chloride SA (KLOR-CON) 20 MEQ tablet Take by mouth. One time daily  . rosuvastatin (CRESTOR) 40 MG tablet TAKE 1 TABLET BY MOUTH ONCE A DAY.  Marland Kitchen phenazopyridine (PYRIDIUM) 100 MG tablet Take 1 tablet (100 mg total) by mouth 3 (three) times daily as needed for pain. (Patient not taking: No sig reported)   No facility-administered encounter medications on file as of 06/13/2020.    Allergies (verified) Simvastatin   History: Past Medical History:  Diagnosis Date  . A-fib ([place])   . Anemia   . Depression   . Dyspnea   . Fractures   . Hyperlipidemia   . Hypertension   . Mental retardation, mild (I.Q. 50-70)    lives alone and cares for self has a nurse aide comes once a day   . Obesity   .  Pneumonia   . PONV (postoperative nausea and vomiting)   . Seizure ([place])    11/17/2012 "still having them; "I believe their from a tumor in my head" ((11/17/2012  . Seizure disorder ([place])   . Thyroid cancer [facility])    Past Surgical History:  Procedure Laterality Date  . ABDOMINAL HYSTERECTOMY  2001  . BREAST LUMPECTOMY  1996   right   . COLONOSCOPY N/A 05/16/2016   Dr. Oneida Alar: Redundant colon, hemorrhoids, next colonoscopy 10 years  . INGUINAL HERNIA REPAIR N/A 08/12/2019   Procedure: laparoscopic incisional hernia  repair;  Surgeon: Lucas Mallow, MD;  Location: [facility];  Service: Urology;  Laterality: N/A;  . ROBOTIC ASSITED PARTIAL NEPHRECTOMY Right 08/05/2019   Procedure: XI ROBOTIC ASSITED PARTIAL NEPHRECTOMY WITH INTRAOPERATIVE ULTRASOUND;  Surgeon: [name], MD;  Location: [facility];  Service: Urology;  Laterality: Right;  3 HRS  . THYROIDECTOMY Bilateral 11/17/2012   Procedure: TOTAL THYROIDECTOMY;  Surgeon: Ascencion Dike, MD;  Location: [facility];  Service: ENT;  Laterality: Bilateral;  . TOTAL THYROIDECTOMY Bilateral 11/17/2012  . TUBAL LIGATION     Family History  Problem Relation Age of Onset  . Diabetes Mother   . Colon cancer Neg Hx    Social History   Socioeconomic History  . Marital status: Single    Spouse name: Not on file  . Number of children: 2  . Years of education: Not on file  . Highest education level: Not on file  Occupational History  . Occupation: disabled   Tobacco Use  . Smoking status: Never Smoker  . Smokeless tobacco: Never Used  Vaping Use  . Vaping Use: Never used  Substance and Sexual Activity  . Alcohol use: No  . Drug use: No  . Sexual activity: Never  Other Topics Concern  . Not on file  Social History Narrative  . Not on file   Social Determinants of Health   Financial Resource Strain: Low Risk   . Difficulty of Paying Living Expenses: Not hard at all  Food Insecurity: No Food Insecurity  . Worried About Charity fundraiser in the Last Year: Never true  . Ran Out of Food in the Last Year: Never true  Transportation Needs: No Transportation Needs  . Lack of Transportation (Medical): No  . Lack of Transportation (Non-Medical): No  Physical Activity: Sufficiently Active  . Days of Exercise per Week: 7 days  . Minutes of Exercise per Session: 60 min  Stress: No Stress Concern Present  . Feeling of Stress : Not at all  Social Connections: Socially Isolated  . Frequency of Communication with Friends and Family: More than three times a week  .  Frequency of Social Gatherings with Friends and Family: Twice a week  . Attends Religious Services: Never  . Active Member of Clubs or Organizations: No  . Attends Archivist Meetings: Never  . Marital Status: Never married    Tobacco Counseling Counseling given: Not Answered   Clinical Intake:  Pre-visit preparation completed: Yes  Pain : No/denies pain     Nutritional Risks: None Diabetes: No  How often do you need to have someone help you when you read instructions, pamphlets, or other written materials from your doctor or pharmacy?: 5 - Always What is the last grade level you completed in school?: 9th Grade  Diabetic?No  Interpreter Needed?: No  Information entered by :: [place] of Daily Living In your present state of health,  do you have any difficulty performing the following activities: 06/13/2020 08/13/2019  Hearing? N N  Vision? N N  Difficulty concentrating or making decisions? N Y  Walking or climbing stairs? N Y  Dressing or bathing? N N  Doing errands, shopping? N Y  Conservation officer, nature and eating ? N -  Using the Toilet? N -  In the past six months, have you accidently leaked urine? N -  Do you have problems with loss of bowel control? N -  Managing your Medications? N -  Managing your Finances? N -  Housekeeping or managing your Housekeeping? N -  Some recent data might be hidden    Patient Care Team: [name], MD as PCP - [place], Alphonse Guild, MD as PCP - Cardiology (Cardiology) Eloise Harman, DO as Consulting Physician (Internal Medicine) Phillips Odor, MD as Consulting Physician (Neurology)  Indicate any recent Medical Services you may have received from other than [facility] providers in the past year (date may be approximate).     Assessment:   This is a routine wellness examination for [place].  Hearing/Vision screen  Hearing Screening   125Hz  250Hz  500Hz  1000Hz  2000Hz  3000Hz  4000Hz  6000Hz  8000Hz    Right ear:           Left ear:           Vision Screening Comments: Patient states has not had eyes examined in several years.   Dietary issues and exercise activities discussed: Current Exercise Habits: Home exercise routine, Type of exercise: walking, Time (Minutes): 60, Frequency (Times/Week): 7, Weekly Exercise (Minutes/Week): 420, Intensity: Moderate, Exercise limited by: None identified  Goals    . Weight (lb) < 200 lb (90.7 kg)      Depression Screen PHQ 2/9 Scores 06/13/2020 12/22/2019 11/29/2019 10/06/2019 07/28/2019 04/04/2019 12/01/2018  PHQ - 2 Score 0 0 0 0 0 0 6  PHQ- 9 Score - 1 0 - - 6 13  Exception Documentation - - - - - - -  Not completed - - - - - - -    Fall Risk Fall Risk  06/13/2020 12/22/2019 11/29/2019 10/06/2019 07/28/2019  Falls in the past year? 0 0 0 1 0  Comment - - - - -  Number falls in past yr: 0 0 0 0 0  Injury with Fall? 0 0 0 0 0  Risk for fall due to : No Fall Risks No Fall Risks - - -  Follow up Falls evaluation completed;Falls prevention discussed Falls evaluation completed - - -    FALL RISK PREVENTION PERTAINING TO THE HOME:  Any stairs in or around the home? No  If so, are there any without handrails? No  Home free of loose throw rugs in walkways, pet beds, electrical cords, etc? Yes  Adequate lighting in your home to reduce risk of falls? Yes   ASSISTIVE DEVICES UTILIZED TO PREVENT FALLS:  Life alert? No  Use of a cane, walker or w/c? No  Grab bars in the bathroom? No  Shower chair or bench in shower? No  Elevated toilet seat or a handicapped toilet? No    Cognitive Function:        Immunizations Immunization History  Administered Date(s) Administered  . Fluad Quad(high Dose 65+) 11/29/2019  . H1N1 12/16/2007  . Influenza Split 11/24/2011  . Influenza Whole 11/30/2006, 12/07/2008, 12/26/2009  . Influenza,inj,Quad PF,6+ Mos 11/18/2012, 12/06/2013, 05/23/2015, 01/29/2016, 12/01/2016, 01/18/2018, 12/01/2018  . Pneumococcal  Conjugate-13 04/03/2014  . Pneumococcal Polysaccharide-23 05/10/2019  .  Td 08/22/2003  . Tdap 04/02/2016  . Unspecified SARS-COV-2 Vaccination 05/30/2019    TDAP status: Up to date  Flu Vaccine status: Up to date  Pneumococcal vaccine status: Up to date  Covid-19 vaccine status: Completed vaccines  Qualifies for Shingles Vaccine? Yes   Zostavax completed No   Shingrix Completed?: No.    Education has been provided regarding the importance of this vaccine. Patient has been advised to call insurance company to determine out of pocket expense if they have not yet received this vaccine. Advised may also receive vaccine at local pharmacy or Health Dept. Verbalized acceptance and understanding.  Screening Tests Health Maintenance  Topic Date Due  . COVID-19 Vaccine (2 - Inadvertent risk 4-dose series) 06/27/2019  . INFLUENZA VACCINE  09/24/2020  . MAMMOGRAM  12/22/2020  . TETANUS/TDAP  04/02/2026  . COLONOSCOPY (Pts 45-34yrs Insurance coverage will need to be confirmed)  05/17/2026  . DEXA SCAN  Completed  . Hepatitis C Screening  Completed  . PNA vac Low Risk Adult  Completed  . HPV VACCINES  Aged Out    Health Maintenance  Health Maintenance Due  Topic Date Due  . COVID-19 Vaccine (2 - Inadvertent risk 4-dose series) 06/27/2019    Colorectal cancer screening: Type of screening: Colonoscopy. Completed 05/16/2016. Repeat every 10 years  Mammogram status: Completed 12/23/2019. Repeat every year  Bone Density status: Completed 11/07/2019. Results reflect: Bone density results: OSTEOPOROSIS. Repeat every 2 years.  Lung Cancer Screening: (Low Dose CT Chest recommended if Age 41-80 years, 30 pack-year currently smoking OR have quit w/in 15years.) does not qualify.   Lung Cancer Screening Referral: N/A   Additional Screening:  Hepatitis C Screening: does qualify; Completed 05/23/2015  Vision Screening: Recommended annual ophthalmology exams for early detection of glaucoma  and other disorders of the eye. Is the patient up to date with their annual eye exam?  No  Who is the provider or what is the name of the office in which the patient attends annual eye exams? Does not have an eye doctor at this time  If pt is not established with a provider, would they like to be referred to a provider to establish care? Yes .   Dental Screening: Recommended annual dental exams for proper oral hygiene  Community Resource Referral / Chronic Care Management: CRR required this visit?  No   CCM required this visit?  No      Plan:     I have personally reviewed and noted the following in the patient's chart:   . Medical and social history . Use of alcohol, tobacco or illicit drugs  . Current medications and supplements . Functional ability and status . Nutritional status . Physical activity . Advanced directives . List of other physicians . Hospitalizations, surgeries, and ER visits in previous 12 months . Vitals . Screenings to include cognitive, depression, and falls . Referrals and appointments  In addition, I have reviewed and discussed with patient certain preventive protocols, quality metrics, and best practice recommendations. A written personalized care plan for preventive services as well as general preventive health recommendations were provided to patient.     Ofilia Neas, LPN   06/02/8117   Nurse Notes: None

## 2020-06-13 ENCOUNTER — Other Ambulatory Visit: Payer: Self-pay

## 2020-06-13 ENCOUNTER — Ambulatory Visit (INDEPENDENT_AMBULATORY_CARE_PROVIDER_SITE_OTHER): Payer: Medicare Other

## 2020-06-13 DIAGNOSIS — Z0000 Encounter for general adult medical examination without abnormal findings: Secondary | ICD-10-CM | POA: Diagnosis not present

## 2020-06-13 DIAGNOSIS — Z0100 Encounter for examination of eyes and vision without abnormal findings: Secondary | ICD-10-CM | POA: Diagnosis not present

## 2020-06-13 NOTE — Patient Instructions (Signed)
[name] , Thank you for taking time to come for your Medicare Wellness Visit. I appreciate your ongoing commitment to your health goals. Please review the following plan we discussed and let me know if I can assist you in the future.   Screening recommendations/referrals: Colonoscopy: Up to date, next due 05/17/2026 Mammogram: Up to date, next due 12/22/2020 Bone Density: Up to date, next due 11/06/2021 Recommended yearly ophthalmology/optometry visit for glaucoma screening and checkup Recommended yearly dental visit for hygiene and checkup  Vaccinations: Influenza vaccine: Up to date, next due fall 2022  Pneumococcal vaccine: Completed series Tdap vaccine: Up to date, next due 04/02/2026 Shingles vaccine: Currently due for Shingrix, If you would like to receive we recommend that you do so at your local pharmacy.    Advanced directives: Advance directive discussed with you today. Even though you declined this today please call our office should you change your mind and we can give you the proper paperwork for you to fill out.   Conditions/risks identified: None  Next appointment: 06/19/2021 @ 11:00 am with [name] 66 Years and Older, Female Preventive care refers to lifestyle choices and visits with your health care provider that can promote health and wellness. What does preventive care include?  A yearly physical exam. This is also called an annual well check.  Dental exams once or twice a year.  Routine eye exams. Ask your health care provider how often you should have your eyes checked.  Personal lifestyle choices, including:  Daily care of your teeth and gums.  Regular physical activity.  Eating a healthy diet.  Avoiding tobacco and drug use.  Limiting alcohol use.  Practicing safe sex.  Taking low-dose aspirin every day.  Taking vitamin and mineral supplements as recommended by your health care provider. What happens during  an annual well check? The services and screenings done by your health care provider during your annual well check will depend on your age, overall health, lifestyle risk factors, and family history of disease. Counseling  Your health care provider may ask you questions about your:  Alcohol use.  Tobacco use.  Drug use.  Emotional well-being.  Home and relationship well-being.  Sexual activity.  Eating habits.  History of falls.  Memory and ability to understand (cognition).  Work and work Statistician.  Reproductive health. Screening  You may have the following tests or measurements:  Height, weight, and BMI.  Blood pressure.  Lipid and cholesterol levels. These may be checked every 5 years, or more frequently if you are over 69 years old.  Skin check.  Lung cancer screening. You may have this screening every year starting at age 66 if you have a 30-pack-year history of smoking and currently smoke or have quit within the past 15 years.  Fecal occult blood test (FOBT) of the stool. You may have this test every year starting at age 66.  Flexible sigmoidoscopy or colonoscopy. You may have a sigmoidoscopy every 5 years or a colonoscopy every 10 years starting at age 66.  Hepatitis C blood test.  Hepatitis B blood test.  Sexually transmitted disease (STD) testing.  Diabetes screening. This is done by checking your blood sugar (glucose) after you have not eaten for a while (fasting). You may have this done every 1-3 years.  Bone density scan. This is done to screen for osteoporosis. You may have this done starting at age 66.  Mammogram. This may be done every 1-2 years.  Talk to your health care provider about how often you should have regular mammograms. Talk with your health care provider about your test results, treatment options, and if necessary, the need for more tests. Vaccines  Your health care provider may recommend certain vaccines, such as:  Influenza  vaccine. This is recommended every year.  Tetanus, diphtheria, and acellular pertussis (Tdap, Td) vaccine. You may need a Td booster every 10 years.  Zoster vaccine. You may need this after age 66.  Pneumococcal 13-valent conjugate (PCV13) vaccine. One dose is recommended after age 66.  Pneumococcal polysaccharide (PPSV23) vaccine. One dose is recommended after age 66. Talk to your health care provider about which screenings and vaccines you need and how often you need them. This information is not intended to replace advice given to you by your health care provider. Make sure you discuss any questions you have with your health care provider. Document Released: 03/09/2015 Document Revised: 10/31/2015 Document Reviewed: 12/12/2014 Elsevier Interactive Patient Education  2017 [place] Prevention in the Home Falls can cause injuries. They can happen to people of all ages. There are many things you can do to make your home safe and to help prevent falls. What can I do on the outside of my home?  Regularly fix the edges of walkways and driveways and fix any cracks.  Remove anything that might make you trip as you walk through a door, such as a raised step or threshold.  Trim any bushes or trees on the path to your home.  Use bright outdoor lighting.  Clear any walking paths of anything that might make someone trip, such as rocks or tools.  Regularly check to see if handrails are loose or broken. Make sure that both sides of any steps have handrails.  Any raised decks and porches should have guardrails on the edges.  Have any leaves, snow, or ice cleared regularly.  Use sand or salt on walking paths during winter.  Clean up any spills in your garage right away. This includes oil or grease spills. What can I do in the bathroom?  Use night lights.  Install grab bars by the toilet and in the tub and shower. Do not use towel bars as grab bars.  Use non-skid mats or decals  in the tub or shower.  If you need to sit down in the shower, use a plastic, non-slip stool.  Keep the floor dry. Clean up any water that spills on the floor as soon as it happens.  Remove soap buildup in the tub or shower regularly.  Attach bath mats securely with double-sided non-slip rug tape.  Do not have throw rugs and other things on the floor that can make you trip. What can I do in the bedroom?  Use night lights.  Make sure that you have a light by your bed that is easy to reach.  Do not use any sheets or blankets that are too big for your bed. They should not hang down onto the floor.  Have a firm chair that has side arms. You can use this for support while you get dressed.  Do not have throw rugs and other things on the floor that can make you trip. What can I do in the kitchen?  Clean up any spills right away.  Avoid walking on wet floors.  Keep items that you use a lot in easy-to-reach places.  If you need to reach something above you, use a strong step stool  that has a grab bar.  Keep electrical cords out of the way.  Do not use floor polish or wax that makes floors slippery. If you must use wax, use non-skid floor wax.  Do not have throw rugs and other things on the floor that can make you trip. What can I do with my stairs?  Do not leave any items on the stairs.  Make sure that there are handrails on both sides of the stairs and use them. Fix handrails that are broken or loose. Make sure that handrails are as long as the stairways.  Check any carpeting to make sure that it is firmly attached to the stairs. Fix any carpet that is loose or worn.  Avoid having throw rugs at the top or bottom of the stairs. If you do have throw rugs, attach them to the floor with carpet tape.  Make sure that you have a light switch at the top of the stairs and the bottom of the stairs. If you do not have them, ask someone to add them for you. What else can I do to help  prevent falls?  Wear shoes that:  Do not have high heels.  Have rubber bottoms.  Are comfortable and fit you well.  Are closed at the toe. Do not wear sandals.  If you use a stepladder:  Make sure that it is fully opened. Do not climb a closed stepladder.  Make sure that both sides of the stepladder are locked into place.  Ask someone to hold it for you, if possible.  Clearly mark and make sure that you can see:  Any grab bars or handrails.  First and last steps.  Where the edge of each step is.  Use tools that help you move around (mobility aids) if they are needed. These include:  Canes.  Walkers.  Scooters.  Crutches.  Turn on the lights when you go into a dark area. Replace any light bulbs as soon as they burn out.  Set up your furniture so you have a clear path. Avoid moving your furniture around.  If any of your floors are uneven, fix them.  If there are any pets around you, be aware of where they are.  Review your medicines with your doctor. Some medicines can make you feel dizzy. This can increase your chance of falling. Ask your doctor what other things that you can do to help prevent falls. This information is not intended to replace advice given to you by your health care provider. Make sure you discuss any questions you have with your health care provider. Document Released: 12/07/2008 Document Revised: 07/19/2015 Document Reviewed: 03/17/2014 Elsevier Interactive Patient Education  2017 [employer].

## 2020-06-15 ENCOUNTER — Other Ambulatory Visit: Payer: Self-pay | Admitting: Family Medicine

## 2020-06-20 ENCOUNTER — Other Ambulatory Visit: Payer: Self-pay

## 2020-06-20 DIAGNOSIS — R569 Unspecified convulsions: Secondary | ICD-10-CM

## 2020-06-20 MED ORDER — UNABLE TO FIND: 0 refills | Status: AC

## 2020-06-26 ENCOUNTER — Ambulatory Visit (INDEPENDENT_AMBULATORY_CARE_PROVIDER_SITE_OTHER): Payer: Medicare Other | Admitting: Gastroenterology

## 2020-06-26 ENCOUNTER — Other Ambulatory Visit: Payer: Self-pay

## 2020-06-26 ENCOUNTER — Encounter: Payer: Self-pay | Admitting: Gastroenterology

## 2020-06-26 VITALS — BP 123/73 | HR 74 | Temp 96.8°F | Ht 65.0 in | Wt 229.8 lb

## 2020-06-26 DIAGNOSIS — R945 Abnormal results of liver function studies: Secondary | ICD-10-CM

## 2020-06-26 DIAGNOSIS — R7989 Other specified abnormal findings of blood chemistry: Secondary | ICD-10-CM

## 2020-06-26 NOTE — Progress Notes (Signed)
Primary Care Physician: [name], MD  Primary Gastroenterologist:  Elon Alas. Abbey Chatters, DO   Chief Complaint  Patient presents with  . Anemia    Doing ok    HPI: [name] is a 65 y.o. female here for follow-up of IDA and abnormal LFTs.  Patient was last seen in October 2021.  She has a history of stage IIIb renal insufficiency, obesity, mild cognitive delays although he lives alone with assistance from family, seizure disorder, thyroid cancer status post treatment with subsequent hypothyroidism, A. fib on Eliquis.  Today patient states that she is feeling well.  Denies any abdominal pain.  No appetite concerns.  Bowel movements are regular.  No blood in the stool or melena.  No dysphagia.  No vomiting.  Heartburn well controlled.   Previous work-up:  Last colonoscopy March 2018, redundant colon, hemorrhoids but otherwise unremarkable.  Follow-up colonoscopy recommended in 10 years.  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  He developed partial small bowel obstruction secondary to partial herniation of small bowel into the right sided lateral laparoscopic port.  Underwent diagnostic laparoscopy with port site incisional hernia repair.  Labs from April 2022: White blood cell count 3200, hemoglobin 11.5, hematocrit 35.9, platelet 209,000, BUN 31, creatinine 1.88.  Labs from December 2021: Iron saturations 57%, TIBC 261, iron 149.  History of transaminitis: June 2021 AST 121/ALT 194.  August 2021: AST 185/ALT 139.  January 2022: AST 15/ALT 9.  Hepatitis B surface antigen nonreactive.  Hepatitis C antibody nonreactive.  Right upper quadrant ultrasound August 2021: IMPRESSION: 1. Mildly heterogeneous appearance of the liver, nonspecific but can be seen in hepatocellular disease state such as hepatitis. No focal liver lesion is identified. 2. Nonvascular masslike intraluminal structure within  the gallbladder lumen most suggestive of tumefactive biliary sludge. An intraluminal neoplasm is felt to be unlikely given the sonographic features as well as the appearance of the gallbladder on recent MRI and CT.   Current Outpatient Medications  Medication Sig Dispense Refill  . acetaminophen (TYLENOL) 500 MG tablet Take 1 tablet (500 mg total) by mouth 2 (two) times daily. (Patient taking differently: Take 500 mg by mouth 2 (two) times daily as needed for mild pain or headache.) 60 tablet 5  . amLODipine (NORVASC) 2.5 MG tablet TAKE 1 TABLET BY MOUTH ONCE DAILY. 30 tablet 0  . benazepril (LOTENSIN) 20 MG tablet TAKE (1) TABLET BY MOUTH ONCE DAILY. 30 tablet 0  . diltiazem (CARDIZEM CD) 180 MG 24 hr capsule TAKE 1 CAPSULE BY MOUTH ONCE DAILY. 30 capsule 0  . ELIQUIS 5 MG TABS tablet TAKE 1 TABLET BY MOUTH TWICE DAILY. 60 tablet 0  . ezetimibe (ZETIA) 10 MG tablet TAKE ONE TABLET BY MOUTH ONCE DAILY. 30 tablet 0  . FLUoxetine (PROZAC) 10 MG capsule TAKE 1 CAPSULE BY MOUTH ONCE A DAY. 30 capsule 0  . KEPPRA 750 MG tablet TAKE (2) TABLETS BY MOUTH TWICE DAILY. 120 tablet 0  . LAMICTAL 150 MG tablet TAKE 1 TABLET BY MOUTH ONCE DAILY. 30 tablet 0  . levothyroxine (SYNTHROID) 125 MCG tablet TAKE ONE TABLET BY MOUTH ONCE DAILY. 90 tablet 0  . omeprazole (PRILOSEC) 20 MG capsule TAKE ONE CAPSULE BY MOUTH ONCE DAILY. 30 capsule 0  . phenazopyridine (PYRIDIUM) 100 MG tablet Take 1 tablet (100 mg total) by mouth 3 (three) times daily as needed for pain. 10 tablet 0  .  potassium chloride SA (KLOR-CON) 20 MEQ tablet Take by mouth. One time daily    . rosuvastatin (CRESTOR) 40 MG tablet TAKE 1 TABLET BY MOUTH ONCE A DAY. 90 tablet 0  . UNABLE TO FIND HHS Bath chair Bath mat 1 each 0   No current facility-administered medications for this visit.    Allergies as of 06/26/2020 - Review Complete 06/26/2020  Allergen Reaction Noted  . Simvastatin Other (See Comments) 05/23/2015     ROS:  General: Negative for anorexia, weight loss, fever, chills, fatigue, weakness. ENT: Negative for hoarseness, difficulty swallowing , nasal congestion. CV: Negative for chest pain, angina, palpitations, dyspnea on exertion, peripheral edema.  Respiratory: Negative for dyspnea at rest, dyspnea on exertion, cough, sputum, wheezing.  GI: See history of present illness. GU:  Negative for dysuria, hematuria, urinary incontinence, urinary frequency, nocturnal urination.  Endo: Negative for unusual weight change.    Physical Examination:   BP 123/73   Pulse 74   Temp (!) 96.8 F (36 C) (Temporal)   Ht 5\' 5"  (1.651 m)   Wt 229 lb 12.8 oz (104.2 kg)   BMI 38.24 kg/m   General: Well-nourished, well-developed in no acute distress.  Eyes: No icterus. Mouth: masked Abdomen: Bowel sounds are normal, nontender, nondistended, no hepatosplenomegaly or masses, no abdominal bruits or hernia , no rebound or guarding.   Extremities: No lower extremity edema. No clubbing or deformities. Neuro: Alert and oriented x 4   Skin: Warm and dry, no jaundice.   Psych: Alert and cooperative, normal mood and affect.  Labs:  Lab Results  Component Value Date   CREATININE 1.89 (H) 03/08/2020   BUN 33 (H) 03/08/2020   NA 141 03/08/2020   K 4.2 03/08/2020   CL 105 03/08/2020   CO2 19 (L) 03/08/2020   Lab Results  Component Value Date   ALT 9 03/08/2020   AST 15 03/08/2020   ALKPHOS 120 03/08/2020   BILITOT 0.5 03/08/2020     Imaging Studies: No results found.   Assessment/plan:  Pleasant 65 year old female with multiple comorbidities presenting for follow-up of IDA and abnormal LFTs.  IDA: Patient underwent partial nephrectomy for renal cell carcinoma in June 2021.  She has stage IIIb renal insufficiency.  Patient is on Eliquis for A. fib.  She denies overt GI bleeding.  Last colonoscopy 2018, unremarkable.  Suspect anemia of chronic disease playing a role as well.  Hemoglobin is  remained stable in the 11 range.  Serum iron and iron saturations unremarkable.  We will continue to follow.  Normal LFTs: First noted abnormality in June and on repeat labs in August 2021.  She had a total of 3 CTs and MRI abdomen since January 2021.  Gallbladder unremarkable on the studies.  Right upper quadrant ultrasound in August 2021 showed nonvascular masslike intraluminal structure within the gallbladder lumen most suggestive of tumefactive sludge.  An intraluminal neoplasm felt to be unlikely given scintigraphic features as well as appearance of the gallbladder on recent MRI and CTs.  Viral markers negative.  She did not complete other labs as requested however on follow-up LFTs in January 2022, LFTs were normal.  We will continue to follow.  1. Continue to follow anemia labs as performed by nephrologist. 2. Recheck LFTs next week.

## 2020-06-26 NOTE — Patient Instructions (Signed)
1. Please go for lab work within the next one week to let us follow up on your liver. We will be in touch with results.   At [facility] we value your feedback. You may receive a survey about your visit today. Please share your experience as we strive to create trusting relationships with our patients to provide genuine, compassionate, quality care.   We appreciate your understanding and patience as we review any laboratory studies, imaging, and other diagnostic tests that are ordered as we care for you. Our office policy is 5 business days for review of these results, and any emergent or urgent results are addressed in a timely manner for your best interest. If you do not hear from our office in 1 week, please contact us.    We also encourage the use of MyChart, which contains your medical information for your review as well. If you are not enrolled in this feature, an access code is on this after visit summary for your convenience. Thank you for allowing [place] to be involved in your care.

## 2020-06-26 NOTE — Progress Notes (Signed)
Cc'ed to pcp °

## 2020-07-18 ENCOUNTER — Other Ambulatory Visit: Payer: Self-pay | Admitting: Family Medicine

## 2020-07-19 ENCOUNTER — Other Ambulatory Visit: Payer: Self-pay | Admitting: Family Medicine

## 2020-07-24 ENCOUNTER — Other Ambulatory Visit: Payer: Self-pay | Admitting: "Endocrinology

## 2020-08-07 ENCOUNTER — Other Ambulatory Visit: Payer: Self-pay | Admitting: Family Medicine

## 2020-08-14 ENCOUNTER — Other Ambulatory Visit: Payer: Self-pay | Admitting: Family Medicine

## 2020-09-10 ENCOUNTER — Other Ambulatory Visit: Payer: Self-pay | Admitting: Family Medicine

## 2020-09-12 ENCOUNTER — Ambulatory Visit: Payer: Medicare Other | Admitting: Nurse Practitioner

## 2020-09-13 LAB — TSH: TSH: 0.356 u[IU]/mL — ABNORMAL LOW (ref 0.450–4.500)

## 2020-09-13 LAB — T4, FREE: Free T4: 1.49 ng/dL (ref 0.82–1.77)

## 2020-09-19 ENCOUNTER — Ambulatory Visit: Payer: Medicare Other | Admitting: Nurse Practitioner

## 2020-09-19 DIAGNOSIS — E890 Postprocedural hypothyroidism: Secondary | ICD-10-CM

## 2020-09-19 DIAGNOSIS — C73 Malignant neoplasm of thyroid gland: Secondary | ICD-10-CM

## 2020-09-21 NOTE — Patient Instructions (Signed)

## 2020-09-24 ENCOUNTER — Encounter: Payer: Self-pay | Admitting: Nurse Practitioner

## 2020-09-24 ENCOUNTER — Ambulatory Visit (INDEPENDENT_AMBULATORY_CARE_PROVIDER_SITE_OTHER): Payer: Medicare Other | Admitting: Nurse Practitioner

## 2020-09-24 ENCOUNTER — Other Ambulatory Visit: Payer: Self-pay

## 2020-09-24 VITALS — BP 128/82 | HR 86 | Ht 65.0 in | Wt 236.8 lb

## 2020-09-24 DIAGNOSIS — C73 Malignant neoplasm of thyroid gland: Secondary | ICD-10-CM | POA: Diagnosis not present

## 2020-09-24 DIAGNOSIS — E890 Postprocedural hypothyroidism: Secondary | ICD-10-CM

## 2020-09-24 NOTE — Progress Notes (Signed)
09/24/2020      Endocrinology follow-up note     Subjective:    Patient ID: [name], female    DOB: [birth year],    Past Medical History:  Diagnosis Date   A-fib ([place])    Anemia    Depression    Dyspnea    Fractures    Hyperlipidemia    Hypertension    Mental retardation, mild (I.Q. 50-70)    lives alone and cares for self has a nurse aide comes once a day    Obesity    Pneumonia    PONV (postoperative nausea and vomiting)    Seizure ([place])    11/17/2012 "still having them; "I believe their from a tumor in my head" ((11/17/2012   Seizure disorder ([place])    Thyroid cancer [facility])    Past Surgical History:  Procedure Laterality Date   ABDOMINAL HYSTERECTOMY  2001   BREAST LUMPECTOMY  1996   right    COLONOSCOPY N/A 05/16/2016   Dr. Oneida Alar: Redundant colon, hemorrhoids, next colonoscopy 10 years   INGUINAL HERNIA REPAIR N/A 08/12/2019   Procedure: laparoscopic incisional hernia repair;  Surgeon: Lucas Mallow, MD;  Location: [facility];  Service: Urology;  Laterality: N/A;   ROBOTIC ASSITED PARTIAL NEPHRECTOMY Right 08/05/2019   Procedure: XI ROBOTIC ASSITED PARTIAL NEPHRECTOMY WITH INTRAOPERATIVE ULTRASOUND;  Surgeon: [name], MD;  Location: [facility];  Service: Urology;  Laterality: Right;  3 HRS   THYROIDECTOMY Bilateral 11/17/2012   Procedure: TOTAL THYROIDECTOMY;  Surgeon: Ascencion Dike, MD;  Location: [facility] OR;  Service: ENT;  Laterality: Bilateral;   TOTAL THYROIDECTOMY Bilateral 11/17/2012   TUBAL LIGATION     Social History   Socioeconomic History   Marital status: Single    Spouse name: Not on file   Number of children: 2   Years of education: Not on file   Highest education level: Not on file  Occupational History   Occupation: disabled   Tobacco Use   Smoking status: Never   Smokeless tobacco: Never  Vaping Use   Vaping Use: Never used  Substance and Sexual Activity   Alcohol use: No   Drug use: No   Sexual activity: Never  Other Topics Concern    Not on file  Social History Narrative   Not on file   Social Determinants of Health   Financial Resource Strain: Low Risk    Difficulty of Paying Living Expenses: Not hard at all  Food Insecurity: No Food Insecurity   Worried About Charity fundraiser in the Last Year: Never true   [place] in the Last Year: Never true  Transportation Needs: No Transportation Needs   Lack of Transportation (Medical): No   Lack of Transportation (Non-Medical): No  Physical Activity: Sufficiently Active   Days of Exercise per Week: 7 days   Minutes of Exercise per Session: 60 min  Stress: No Stress Concern Present   Feeling of Stress : Not at all  Social Connections: Socially Isolated   Frequency of Communication with Friends and Family: More than three times a week   Frequency of Social Gatherings with Friends and Family: Twice a week   Attends Religious Services: Never   Marine scientist or Organizations: No   Attends Archivist Meetings: Never   Marital Status: Never married   Outpatient Encounter Medications as of 09/24/2020  Medication Sig   acetaminophen (TYLENOL) 500 MG tablet Take 1 tablet (500 mg  total) by mouth 2 (two) times daily. (Patient taking differently: Take 500 mg by mouth 2 (two) times daily as needed for mild pain or headache.)   amLODipine (NORVASC) 2.5 MG tablet TAKE 1 TABLET BY MOUTH ONCE DAILY.   benazepril (LOTENSIN) 20 MG tablet TAKE (1) TABLET BY MOUTH ONCE DAILY.   diltiazem (CARDIZEM CD) 180 MG 24 hr capsule TAKE 1 CAPSULE BY MOUTH ONCE DAILY.   ELIQUIS 5 MG TABS tablet TAKE 1 TABLET BY MOUTH TWICE DAILY.   ezetimibe (ZETIA) 10 MG tablet TAKE ONE TABLET BY MOUTH ONCE DAILY.   FLUoxetine (PROZAC) 10 MG capsule TAKE 1 CAPSULE BY MOUTH ONCE A DAY.   KEPPRA 750 MG tablet TAKE (2) TABLETS BY MOUTH TWICE DAILY.   LAMICTAL 150 MG tablet TAKE 1 TABLET BY MOUTH ONCE DAILY.   levothyroxine (SYNTHROID) 125 MCG tablet TAKE ONE TABLET BY MOUTH ONCE DAILY.    omeprazole (PRILOSEC) 20 MG capsule TAKE ONE CAPSULE BY MOUTH ONCE DAILY.   phenazopyridine (PYRIDIUM) 100 MG tablet Take 1 tablet (100 mg total) by mouth 3 (three) times daily as needed for pain.   potassium chloride SA (KLOR-CON) 20 MEQ tablet Take by mouth. One time daily   rosuvastatin (CRESTOR) 40 MG tablet TAKE 1 TABLET BY MOUTH ONCE A DAY.   UNABLE TO FIND HHS Bath chair Bath mat   No facility-administered encounter medications on file as of 09/24/2020.   ALLERGIES: Allergies  Allergen Reactions   Simvastatin Other (See Comments)    Muscle aches   VACCINATION STATUS: Immunization History  Administered Date(s) Administered   Fluad Quad(high Dose 65+) 11/29/2019   H1N1 12/16/2007   Influenza Split 11/24/2011   Influenza Whole 11/30/2006, 12/07/2008, 12/26/2009   Influenza,inj,Quad PF,6+ Mos 11/18/2012, 12/06/2013, 05/23/2015, 01/29/2016, 12/01/2016, 01/18/2018, 12/01/2018   Pneumococcal Conjugate-13 04/03/2014   Pneumococcal Polysaccharide-23 05/10/2019   Td 08/22/2003   Tdap 04/02/2016   Unspecified SARS-COV-2 Vaccination 05/30/2019    HPI  65 year old female patient with medical history as above.  She is here to follow-up for post surgical hypothyroidism and for follow-up of Papillary thyroid cancer.  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 She has had thyrogen stimulated remnant ablation with negative post therapy WBS in 2014.  - Second Thyrogen Stimulated whole body scan for surveillance shows no scintigraphic evidence of iodine avid metastasis papillary thyroid cancer on 04/18/2016.  - Most recent surveillance imaging with Thyrogen stimulated whole-body scan on September 27, 2018 showed no evidence of tumor recurrence.  -Her ultrasound from November 07, 2019 was consistent with surgical changes with no new sign of recurrence or residual  tumor.  Her last thyroid imaging on November 07, 2019 also confirmed stable atrophic heterogenous residual right thyroid lobe measuring 2.5 cm x 1.2 cm.  Stability documented since 2019.    She is now on Synthroid 125 mcg p.o. daily before breakfast.  -She has steady weight since last visit.  She has no new complaints.   Review of systems  Constitutional: + Minimally fluctuating body weight,  current Body mass index is 39.41 kg/m. , no fatigue, no subjective hyperthermia, no subjective hypothermia Eyes: no blurry vision, no xerophthalmia ENT: no sore throat, no nodules palpated in throat, no dysphagia/odynophagia, no hoarseness Cardiovascular: no chest pain, no shortness of breath, no palpitations, no leg swelling Respiratory: no cough, no shortness of breath Gastrointestinal: no nausea/vomiting/diarrhea Musculoskeletal: no muscle/joint aches Skin: no rashes,  no hyperemia Neurological: no tremors, no numbness, no tingling, no dizziness Psychiatric: no depression, no anxiety  Objective:    BP 128/82   Pulse 86   Ht 5\' 5"  (1.651 m)   Wt 236 lb 12.8 oz (107.4 kg)   BMI 39.41 kg/m   Wt Readings from Last 3 Encounters:  09/24/20 236 lb 12.8 oz (107.4 kg)  06/26/20 229 lb 12.8 oz (104.2 kg)  03/16/20 221 lb 9.6 oz (100.5 kg)    BP Readings from Last 3 Encounters:  09/24/20 128/82  06/26/20 123/73  03/16/20 118/84    Physical Exam- Limited  Constitutional:  Body mass index is 39.41 kg/m. , not in acute distress, normal state of mind Eyes:  EOMI, no exophthalmos Neck: Supple Thyroid: scar from previous thyroidectomy Cardiovascular: RRR, no murmurs, rubs, or gallops, no edema Respiratory: Adequate breathing efforts, no crackles, rales, rhonchi, or wheezing Musculoskeletal: no gross deformities, strength intact in all four extremities, no gross restriction of joint movements Skin:  no rashes, no hyperemia Neurological: no tremor with outstretched hands   Results for  orders placed or performed in visit on 03/15/20  TSH  Result Value Ref Range   TSH 0.356 (L) 0.450 - 4.500 uIU/mL  T4, free  Result Value Ref Range   Free T4 1.49 0.82 - 1.77 ng/dL   Complete Blood Count (Most recent): Lab Results  Component Value Date   WBC 6.1 10/06/2019   HGB 10.6 (L) 10/06/2019   HCT 32.6 (L) 10/06/2019   MCV 86.0 10/06/2019   PLT 233 10/06/2019   Diabetic Labs (most recent): Lab Results  Component Value Date   HGBA1C 5.2 05/26/2017   HGBA1C 5.3 08/10/2012   Lipid Panel     Component Value Date/Time   CHOL 238 (H) 03/08/2020 0903   TRIG 84 03/08/2020 0903   HDL 51 03/08/2020 0903   CHOLHDL 4.7 (H) 03/08/2020 0903   CHOLHDL 3.3 10/06/2019 0951   VLDL 14 01/29/2016 1109   LDLCALC 172 (H) 03/08/2020 0903   LDLCALC 80 10/06/2019 0951    June 01, 2017  IMPRESSION:  Questioned approximately 2.2 cm soft tissue within the right lobectomy resection bed appears similar to the 07/2015 examination.   Note, while nonspecific, this tissue was apparently present at the time of the Thyrogen stimulated I 131 nuclear medicine whole body scan performed 03/2016 which was negative for the presence of iodine avid metastatic thyroid cancer or residual disease, and thus favored to be benign etiology. Clinical correlation is advised.  Thyrogen stimulated whole-body scan September 27, 2018 fINDINGS:  Excreted tracer within colon and urinary bladder. Small amount of retained gastric activity. Pattern is similar to that seen on the previous exam.   No sites of iodine-avid metastatic thyroid cancer identified. No residual tracer accumulation in the neck.   IMPRESSION: No scintigraphic evidence of iodine-avid metastatic thyroid cancer.     Ultrasound of thyroid on November 07, 2019 -Right lobe 2.5 x 1.2 x 0.9 cm, left lobe surgically absent. Stable atrophic heterogeneous residual right thyroid lobe as before. No hypervascularity. No left thyroid bed abnormality. No  regional adenopathy.   IMPRESSION: Stable thyroid ultrasound compared 2019 as above.  Results for [name], [name] (MRN [id number]) as of 09/24/2020 09:20  Ref. Range 09/12/2020 10:02  TSH Latest Ref Range: 0.450 - 4.500 uIU/mL 0.356 (L)  T4,Free(Direct) Latest Ref Range: 0.82 - 1.77 ng/dL 1.49    Assessment & Plan:   1. Hypothyroidism, postsurgical -Her previsit thyroid function tests are consistent  with appropriate hormone replacement.  She is advised to continue her Levothyroxine 125 mcg po daily before breakfast.    - We discussed about the correct intake of her thyroid hormone, on empty stomach at fasting, with water, separated by at least 30 minutes from breakfast and other medications,  and separated by more than 4 hours from calcium, iron, multivitamins, acid reflux medications (PPIs). -Patient is made aware of the fact that thyroid hormone replacement is needed for life, dose to be adjusted by periodic monitoring of thyroid function tests.  - Target TSH for her would be between 0.1-0.5.  2. Papillary thyroid carcinoma ([place]) Her surveillance neck /thyroid u/s from 11/17/2013 was c/w surgically absent thyroid, however there is a 0.9 cm left cervical lymph node. See below.  Pt is s/p thyrogen stimulated thyroid remnant ablation with WBS on 01/14/2013 with no evidence of iodine avid metastasis. She has had pT2,Nx,Mx FVPTC, s/p near total thyroidectomy on September 24,2014. She is counseled to maintain high degree of compliance with synthroid therapy with aim of keeping TSH low normal ( 0.1-0.5).  -she is advised on the necessity of follow-ups and imaging studies at least one annually for the next 5 years. - She was sent for core biopsy of 0.9 cm left cervical lymph node last visit. However, this procedure was abandoned due to " no evidence of adenopathy".  The previously noticed  left cervical lymph node was sent to decrease in size to 0.5 cm. Medicaid did not cover surveillance  ultrasound. - Second Thyrogen estimated or body scan for surveillance shows no scintigraphic evidence of iodine avid metastasis papillary thyroid cancer on 04/18/2016. -June 01, 2017 surveillance thyroid/neck ultrasound : Unremarkable, see above.   -She underwent  Thyrogen stimulated whole body scan which is negative for tumor recurrence.   Her recent thyroid/neck ultrasound from September 2021 showed stable atrophic heterogenous residual thyroid lobe measuring 2.5 x1.2 cm observed to have been stable since 2019.  She will not need any immediate intervention for this at this time.   She will be considered for another thyroid ultrasound after her next visit.    She is advised to keep close follow-up with Dr. Tula Nakayama for primary care needs.    I spent 20 minutes in the care of the patient today including review of labs from Thyroid Function, CMP, and other relevant labs ; imaging/biopsy records (current and previous including abstractions from other facilities); face-to-face time discussing  her lab results and symptoms, medications doses, her options of short and long term treatment based on the latest standards of care / guidelines;   and documenting the encounter.  Delrae Alfred Duignan  participated in the discussions, expressed understanding, and voiced agreement with the above plans.  All questions were answered to her satisfaction. she is encouraged to contact clinic should she have any questions or concerns prior to her return visit.   Follow up plan: Return in about 6 months (around 03/27/2021) for Thyroid follow up, Previsit labs.  Rayetta Pigg, [facility] [address] [place], [place] [address] Phone: [phone] Fax: [phone]   09/24/2020, 9:21 AM

## 2020-10-05 ENCOUNTER — Telehealth: Payer: Self-pay | Admitting: Gastroenterology

## 2020-10-05 NOTE — Telephone Encounter (Signed)
Please remind patient to complete lfts ordered back in 06/2020. I did review labs done by nephrology, anemia is stable.   Recommend ov in 06/2021. Please NIC.

## 2020-10-12 ENCOUNTER — Ambulatory Visit: Payer: Medicare Other | Admitting: Family Medicine

## 2020-10-15 ENCOUNTER — Other Ambulatory Visit: Payer: Self-pay | Admitting: Family Medicine

## 2020-10-22 ENCOUNTER — Telehealth: Payer: Self-pay

## 2020-10-22 MED ORDER — LEVOTHYROXINE SODIUM 125 MCG PO TABS: 125.0000 ug | ORAL_TABLET | Freq: Every day | ORAL | 0 refills | Status: DC

## 2020-10-22 NOTE — Telephone Encounter (Signed)
Rx sent 

## 2020-10-22 NOTE — Telephone Encounter (Signed)
Pt requesting a refill on her thyroid medication to [facility]

## 2020-10-24 ENCOUNTER — Other Ambulatory Visit: Payer: Self-pay | Admitting: Nurse Practitioner

## 2020-10-30 NOTE — Telephone Encounter (Signed)
Noted  

## 2020-11-14 ENCOUNTER — Other Ambulatory Visit: Payer: Self-pay | Admitting: Family Medicine

## 2020-11-29 ENCOUNTER — Other Ambulatory Visit: Payer: Self-pay

## 2020-11-29 ENCOUNTER — Ambulatory Visit (INDEPENDENT_AMBULATORY_CARE_PROVIDER_SITE_OTHER): Payer: Medicare Other | Admitting: Family Medicine

## 2020-11-29 VITALS — BP 112/67 | HR 98 | Resp 14 | Ht 65.0 in | Wt 237.8 lb

## 2020-11-29 DIAGNOSIS — I4891 Unspecified atrial fibrillation: Secondary | ICD-10-CM

## 2020-11-29 DIAGNOSIS — Z23 Encounter for immunization: Secondary | ICD-10-CM

## 2020-11-29 DIAGNOSIS — I10 Essential (primary) hypertension: Secondary | ICD-10-CM

## 2020-11-29 DIAGNOSIS — E559 Vitamin D deficiency, unspecified: Secondary | ICD-10-CM

## 2020-11-29 DIAGNOSIS — R569 Unspecified convulsions: Secondary | ICD-10-CM

## 2020-11-29 DIAGNOSIS — Z1231 Encounter for screening mammogram for malignant neoplasm of breast: Secondary | ICD-10-CM

## 2020-11-29 DIAGNOSIS — E785 Hyperlipidemia, unspecified: Secondary | ICD-10-CM | POA: Diagnosis not present

## 2020-11-29 DIAGNOSIS — E890 Postprocedural hypothyroidism: Secondary | ICD-10-CM

## 2020-11-29 NOTE — Patient Instructions (Addendum)
F/U in 5 months. Call if you need me before  Flu vaccine today  Need shingrix ( 2) and covid vaccine at your pharmacy    Please schedule mammogram at checkout  Lipid, cmp and EGFr, CBC, Vit D Fasting in the morning   Thanks for choosing [facility], we consider it a privelige to serve you.

## 2020-11-29 NOTE — Progress Notes (Signed)
[name]     MRN: [id number]      DOB: 07/23/1955   HPI [name] is here for follow up and re-evaluation of chronic medical conditions, medication management and review of any available recent lab and radiology data.  Preventive health is updated, specifically  Cancer screening and Immunization.   Questions or concerns regarding consultations or procedures which the PT has had in the interim are  addressed. The PT denies any adverse reactions to current medications since the last visit.  There are no new concerns.  There are no specific complaints   ROS Denies recent fever or chills. Denies sinus pressure, nasal congestion, ear pain or sore throat. Denies chest congestion, productive cough or wheezing. Denies chest pains, palpitations and leg swelling Denies abdominal pain, nausea, vomiting,diarrhea or constipation.   Denies dysuria, frequency, hesitancy or incontinence. Denies joint pain, swelling and limitation in mobility. Denies headaches, seizures, numbness, or tingling. Denies uncontrolled  depression, anxiety or insomnia. Denies skin break down or rash.   PE  BP 112/67   Pulse 98   Resp 14   Ht 5\' 5"  (1.651 m)   Wt 237 lb 12.8 oz (107.9 kg)   SpO2 96%   BMI 39.57 kg/m    Patient alert and oriented and in no cardiopulmonary distress.  HEENT: No facial asymmetry, EOMI,     Neck supple .  Chest: Clear to auscultation bilaterally.  CVS: S1, S2 no murmurs, no S3.Regular rate.  ABD: Soft non tender.   Ext: No edema  MS: Adequate though reduced  ROM spine, shoulders, hips and knees.  Skin: Intact, no ulcerations or rash noted.  Psych: Good eye contact, normal affect. not anxious or depressed appearing.  CNS: CN 2-12 intact, power,  normal throughout.no focal deficits noted.   Assessment & Plan  Essential hypertension Controlled, no change in medication DASH diet and commitment to daily physical activity for a minimum of 30 minutes discussed and  encouraged, as a part of hypertension management. The importance of attaining a healthy weight is also discussed.  BP/Weight 11/29/2020 09/24/2020 06/26/2020 06/13/2020 03/16/2020 03/15/2020 37/90/2409  Systolic BP 735 329 924 - 268 341 962  Diastolic BP 67 82 73 - 84 76 70  Wt. (Lbs) 237.8 236.8 229.8 - 221.6 223.4 225  BMI 39.57 39.41 38.24 - 36.88 37.18 37.44       Atrial fibrillation with RVR (HCC) Rate currently controlled, continue eliquis, no c/o hematuria,  Rectal bleeding pr hemoptysis  Hyperlipidemia LDL goal <100 Hyperlipidemia:Low fat diet discussed and encouraged.   Lipid Panel  Lab Results  Component Value Date   CHOL 147 11/30/2020   HDL 48 11/30/2020   LDLCALC 87 11/30/2020   TRIG 60 11/30/2020   CHOLHDL 3.1 11/30/2020   Controlled, no change in medication     Hypothyroidism, postsurgical Managed by Endo  Morbid obesity ([place])  Patient re-educated about  the importance of commitment to a  minimum of 150 minutes of exercise per week as able.  The importance of healthy food choices with portion control discussed, as well as eating regularly and within a 12 hour window most days. The need to choose "clean , green" food 50 to 75% of the time is discussed, as well as to make water the primary drink and set a goal of 64 ounces water daily.    Weight /BMI 11/29/2020 09/24/2020 06/26/2020  WEIGHT 237 lb 12.8 oz 236 lb 12.8 oz 229 lb 12.8 oz  HEIGHT 5\' 5"   5\' 5"  5\' 5"   BMI 39.57 kg/m2 39.41 kg/m2 38.24 kg/m2      Seizures ([place]) Managed by Neurology, reports possible recent seizure with lower lip trauma 1 week ago

## 2020-12-01 LAB — LIPID PANEL
Chol/HDL Ratio: 3.1 ratio (ref 0.0–4.4)
Cholesterol, Total: 147 mg/dL (ref 100–199)
HDL: 48 mg/dL (ref >39)
LDL Chol Calc (NIH): 87 mg/dL (ref 0–99)
Triglycerides: 60 mg/dL (ref 0–149)
VLDL Cholesterol Cal: 12 mg/dL (ref 5–40)

## 2020-12-01 LAB — CMP14+EGFR
ALT: 10 IU/L (ref 0–32)
AST: 17 IU/L (ref 0–40)
Albumin/Globulin Ratio: 1.1 — ABNORMAL LOW (ref 1.2–2.2)
Albumin: 4.1 g/dL (ref 3.8–4.8)
Alkaline Phosphatase: 156 IU/L — ABNORMAL HIGH (ref 44–121)
BUN/Creatinine Ratio: 18 (ref 12–28)
BUN: 38 mg/dL — ABNORMAL HIGH (ref 8–27)
Bilirubin Total: 0.4 mg/dL (ref 0.0–1.2)
CO2: 19 mmol/L — ABNORMAL LOW (ref 20–29)
Calcium: 10.6 mg/dL — ABNORMAL HIGH (ref 8.7–10.3)
Chloride: 105 mmol/L (ref 96–106)
Creatinine, Ser: 2.06 mg/dL — ABNORMAL HIGH (ref 0.57–1.00)
Globulin, Total: 3.6 g/dL (ref 1.5–4.5)
Glucose: 94 mg/dL (ref 70–99)
Potassium: 5.1 mmol/L (ref 3.5–5.2)
Sodium: 141 mmol/L (ref 134–144)
Total Protein: 7.7 g/dL (ref 6.0–8.5)
eGFR: 26 mL/min/{1.73_m2} — ABNORMAL LOW (ref >59)

## 2020-12-01 LAB — CBC
Hematocrit: 36.4 % (ref 34.0–46.6)
Hemoglobin: 11.6 g/dL (ref 11.1–15.9)
MCH: 27.5 pg (ref 26.6–33.0)
MCHC: 31.9 g/dL (ref 31.5–35.7)
MCV: 86 fL (ref 79–97)
Platelets: 179 10*3/uL (ref 150–450)
RBC: 4.22 x10E6/uL (ref 3.77–5.28)
RDW: 13.6 % (ref 11.7–15.4)
WBC: 4 10*3/uL (ref 3.4–10.8)

## 2020-12-01 LAB — VITAMIN D 25 HYDROXY (VIT D DEFICIENCY, FRACTURES): Vit D, 25-Hydroxy: 42.9 ng/mL (ref 30.0–100.0)

## 2020-12-02 ENCOUNTER — Encounter: Payer: Self-pay | Admitting: Family Medicine

## 2020-12-02 MED ORDER — FLUOXETINE HCL 10 MG PO CAPS: 10.0000 mg | ORAL_CAPSULE | Freq: Every day | ORAL | 5 refills | Status: DC

## 2020-12-02 NOTE — Assessment & Plan Note (Addendum)
Rate currently controlled, continue eliquis, no c/o hematuria,  Rectal bleeding pr hemoptysis

## 2020-12-02 NOTE — Assessment & Plan Note (Signed)
  Patient re-educated about  the importance of commitment to a  minimum of 150 minutes of exercise per week as able.  The importance of healthy food choices with portion control discussed, as well as eating regularly and within a 12 hour window most days. The need to choose "clean , green" food 50 to 75% of the time is discussed, as well as to make water the primary drink and set a goal of 64 ounces water daily.    Weight /BMI 11/29/2020 09/24/2020 06/26/2020  WEIGHT 237 lb 12.8 oz 236 lb 12.8 oz 229 lb 12.8 oz  HEIGHT 5\' 5"  5\' 5"  5\' 5"   BMI 39.57 kg/m2 39.41 kg/m2 38.24 kg/m2

## 2020-12-02 NOTE — Assessment & Plan Note (Signed)
Managed by Neurology, reports possible recent seizure with lower lip trauma 1 week ago

## 2020-12-02 NOTE — Assessment & Plan Note (Signed)
Hyperlipidemia:Low fat diet discussed and encouraged.   Lipid Panel  Lab Results  Component Value Date   CHOL 147 11/30/2020   HDL 48 11/30/2020   LDLCALC 87 11/30/2020   TRIG 60 11/30/2020   CHOLHDL 3.1 11/30/2020   Controlled, no change in medication

## 2020-12-02 NOTE — Assessment & Plan Note (Signed)
Managed by Endo 

## 2020-12-02 NOTE — Assessment & Plan Note (Signed)
Controlled, no change in medication DASH diet and commitment to daily physical activity for a minimum of 30 minutes discussed and encouraged, as a part of hypertension management. The importance of attaining a healthy weight is also discussed.  BP/Weight 11/29/2020 09/24/2020 06/26/2020 06/13/2020 03/16/2020 03/15/2020 01/41/5973  Systolic BP 312 508 719 - 941 290 475  Diastolic BP 67 82 73 - 84 76 70  Wt. (Lbs) 237.8 236.8 229.8 - 221.6 223.4 225  BMI 39.57 39.41 38.24 - 36.88 37.18 37.44

## 2020-12-12 ENCOUNTER — Other Ambulatory Visit: Payer: Self-pay | Admitting: Family Medicine

## 2020-12-13 ENCOUNTER — Other Ambulatory Visit: Payer: Self-pay | Admitting: Family Medicine

## 2020-12-18 ENCOUNTER — Other Ambulatory Visit: Payer: Self-pay | Admitting: Family Medicine

## 2020-12-24 ENCOUNTER — Other Ambulatory Visit: Payer: Self-pay

## 2020-12-24 ENCOUNTER — Ambulatory Visit (HOSPITAL_COMMUNITY)
Admission: RE | Admit: 2020-12-24 | Discharge: 2020-12-24 | Disposition: A | Discharge status: Home / Self Care | Payer: Medicare Other | Source: Ambulatory Visit | Attending: Family Medicine | Admitting: Family Medicine

## 2020-12-24 DIAGNOSIS — Z1231 Encounter for screening mammogram for malignant neoplasm of breast: Secondary | ICD-10-CM | POA: Diagnosis present

## 2021-01-14 ENCOUNTER — Other Ambulatory Visit: Payer: Self-pay | Admitting: Family Medicine

## 2021-01-25 ENCOUNTER — Encounter (HOSPITAL_COMMUNITY): Payer: Self-pay | Admitting: Emergency Medicine

## 2021-01-25 ENCOUNTER — Other Ambulatory Visit: Payer: Self-pay

## 2021-01-25 ENCOUNTER — Emergency Department (HOSPITAL_COMMUNITY)
Admission: EM | Admit: 2021-01-25 | Discharge: 2021-01-26 | Disposition: A | Discharge status: Home / Self Care | Payer: Medicare Other | Attending: Emergency Medicine | Admitting: Emergency Medicine

## 2021-01-25 DIAGNOSIS — G40909 Epilepsy, unspecified, not intractable, without status epilepticus: Secondary | ICD-10-CM | POA: Diagnosis not present

## 2021-01-25 DIAGNOSIS — Z79899 Other long term (current) drug therapy: Secondary | ICD-10-CM | POA: Diagnosis not present

## 2021-01-25 DIAGNOSIS — I129 Hypertensive chronic kidney disease with stage 1 through stage 4 chronic kidney disease, or unspecified chronic kidney disease: Secondary | ICD-10-CM | POA: Insufficient documentation

## 2021-01-25 DIAGNOSIS — N183 Chronic kidney disease, stage 3 unspecified: Secondary | ICD-10-CM | POA: Diagnosis not present

## 2021-01-25 DIAGNOSIS — I4891 Unspecified atrial fibrillation: Secondary | ICD-10-CM | POA: Insufficient documentation

## 2021-01-25 DIAGNOSIS — Z7901 Long term (current) use of anticoagulants: Secondary | ICD-10-CM | POA: Diagnosis not present

## 2021-01-25 DIAGNOSIS — E039 Hypothyroidism, unspecified: Secondary | ICD-10-CM | POA: Insufficient documentation

## 2021-01-25 DIAGNOSIS — R569 Unspecified convulsions: Secondary | ICD-10-CM

## 2021-01-25 DIAGNOSIS — Z20822 Contact with and (suspected) exposure to covid-19: Secondary | ICD-10-CM | POA: Insufficient documentation

## 2021-01-25 LAB — BASIC METABOLIC PANEL
Anion gap: 9 (ref 5–15)
BUN: 49 mg/dL — ABNORMAL HIGH (ref 8–23)
CO2: 21 mmol/L — ABNORMAL LOW (ref 22–32)
Calcium: 9.6 mg/dL (ref 8.9–10.3)
Chloride: 108 mmol/L (ref 98–111)
Creatinine, Ser: 2.28 mg/dL — ABNORMAL HIGH (ref 0.44–1.00)
GFR, Estimated: 23 mL/min — ABNORMAL LOW (ref >60)
Glucose, Bld: 103 mg/dL — ABNORMAL HIGH (ref 70–99)
Potassium: 5.6 mmol/L — ABNORMAL HIGH (ref 3.5–5.1)
Sodium: 138 mmol/L (ref 135–145)

## 2021-01-25 LAB — CBC WITH DIFFERENTIAL/PLATELET
Abs Immature Granulocytes: 0.02 10*3/uL (ref 0.00–0.07)
Basophils Absolute: 0 10*3/uL (ref 0.0–0.1)
Basophils Relative: 1 %
Eosinophils Absolute: 0 10*3/uL (ref 0.0–0.5)
Eosinophils Relative: 1 %
HCT: 36.1 % (ref 36.0–46.0)
Hemoglobin: 11.3 g/dL — ABNORMAL LOW (ref 12.0–15.0)
Immature Granulocytes: 0 %
Lymphocytes Relative: 29 %
Lymphs Abs: 1.6 10*3/uL (ref 0.7–4.0)
MCH: 28 pg (ref 26.0–34.0)
MCHC: 31.3 g/dL (ref 30.0–36.0)
MCV: 89.4 fL (ref 80.0–100.0)
Monocytes Absolute: 0.5 10*3/uL (ref 0.1–1.0)
Monocytes Relative: 9 %
Neutro Abs: 3.4 10*3/uL (ref 1.7–7.7)
Neutrophils Relative %: 60 %
Platelets: 184 10*3/uL (ref 150–400)
RBC: 4.04 MIL/uL (ref 3.87–5.11)
RDW: 13.9 % (ref 11.5–15.5)
WBC: 5.6 10*3/uL (ref 4.0–10.5)
nRBC: 0 % (ref 0.0–0.2)

## 2021-01-25 LAB — RESP PANEL BY RT-PCR (FLU A&B, COVID) ARPGX2
Influenza A by PCR: NEGATIVE
Influenza B by PCR: NEGATIVE
SARS Coronavirus 2 by RT PCR: NEGATIVE

## 2021-01-25 LAB — CBG MONITORING, ED: Glucose-Capillary: 93 mg/dL (ref 70–99)

## 2021-01-25 MED ORDER — SODIUM ZIRCONIUM CYCLOSILICATE 5 G PO PACK
5.0000 g | PACK | Freq: Once | ORAL | Status: AC
  Administered 2021-01-26: 5 g via ORAL
  Filled 2021-01-25: qty 1

## 2021-01-25 NOTE — ED Triage Notes (Signed)
Pt here from home with c/o a possible seizure approx 2 hrs ago , pt does have a hx of seizures , takes keppra and has not missed any doses , pt arrives to the ED alert and oriented with c/o "burning " in her head

## 2021-01-25 NOTE — ED Provider Notes (Signed)
  Face-to-face evaluation   History: Patient presents after having a seizure.  She has not had one in a long time.  She is apparently taking her usual seizure medicines.  She has been having fever and some sniffling recently.  She has not been vomiting.  She is here with a family friend who gives most of the history.  Physical exam: Alert obese elderly female.  No respiratory distress.  No dysarthria or aphasia.  Medical screening examination/treatment/procedure(s) were conducted as a shared visit with non-physician practitioner(s) and myself.  I personally evaluated the patient during the encounter    Daleen Bo, MD 01/26/21 1431

## 2021-01-25 NOTE — ED Provider Notes (Signed)
[facility] EMERGENCY DEPARTMENT Provider Note   CSN: [id number] Arrival date & time: 01/25/21  1729     History Chief Complaint  Patient presents with   Seizures    [name] is a 65 y.o. female with known seizure disorder who presents the emergency department after having a witnessed seizure that occurred prior to arrival.  Patient states that she was at her neighbor's house sitting on the couch when she had a seizure according to the neighbor.  Family states that her seizures typically lasts a few seconds and is postictal around a few minutes.  She has had a seizure disorder since she was a young child.  Unknown when her last seizure was prior to today.  She has been taking her Keppra religiously and has not missed any doses.  She does mention that she is been feeling under the weather over the last 5 days with general malaise and myalgias.  She denies any nausea, vomiting, diarrhea, chest pain, shortness of breath.   Seizures     Past Medical History:  Diagnosis Date   A-fib ([place])    Anemia    Depression    Dyspnea    Fractures    Hyperlipidemia    Hypertension    Mental retardation, mild (I.Q. 50-70)    lives alone and cares for self has a nurse aide comes once a day    Obesity    Pneumonia    PONV (postoperative nausea and vomiting)    Seizure ([place])    11/17/2012 "still having them; "I believe their from a tumor in my head" ((11/17/2012   Seizure disorder ([place])    Thyroid cancer [facility])     Patient Active Problem List   Diagnosis Date Noted   Atrial fibrillation with RVR ([place]) 08/14/2019   Hypothyroidism    Other specified disorders of kidney and ureter 05/10/2019   Renal mass, right 05/10/2019   Tachycardia 07/04/2018   Acute renal failure superimposed on stage 3 chronic kidney disease (HCC)    Depression, major, single episode, mild ([place]) 03/21/2018   CKD (chronic kidney disease) stage 3, GFR 30-59 ml/min (HCC) 04/03/2016   GERD (gastroesophageal reflux  disease) 08/23/2015   Osteoarthritis of right knee 08/15/2014   Hypothyroidism, postsurgical 08/05/2013   Reduced vision 05/03/2013   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 Oneida Alar: Redundant colon, hemorrhoids, next colonoscopy 10 years   INGUINAL HERNIA REPAIR N/A 08/12/2019   Procedure: laparoscopic incisional hernia repair;  Surgeon: Lucas Mallow, MD;  Location: [facility] ORS;  Service: Urology;  Laterality: N/A;   ROBOTIC ASSITED PARTIAL NEPHRECTOMY Right 08/05/2019   Procedure: XI ROBOTIC ASSITED PARTIAL NEPHRECTOMY WITH INTRAOPERATIVE ULTRASOUND;  Surgeon: [name], MD;  Location: [facility] ORS;  Service: Urology;  Laterality: Right;  3 HRS   THYROIDECTOMY Bilateral 11/17/2012   Procedure: TOTAL THYROIDECTOMY;  Surgeon: Ascencion Dike, MD;  Location: [facility];  Service: ENT;  Laterality: Bilateral;   TOTAL THYROIDECTOMY Bilateral 11/17/2012   TUBAL LIGATION       OB History   No obstetric history on  file.     Family History  Problem Relation Age of Onset   Diabetes Mother    Colon cancer Neg Hx     Social History   Tobacco Use   Smoking status: Never   Smokeless tobacco: Never  Vaping Use   Vaping Use: Never used  Substance Use Topics   Alcohol use: No   Drug use: No    Home Medications Prior to Admission medications   Medication Sig Start Date End Date Taking? Authorizing Provider  acetaminophen (TYLENOL) 500 MG tablet Take 1 tablet (500 mg total) by mouth 2 (two) times daily. Patient taking differently: Take 500 mg by mouth 2 (two) times daily as needed for mild  pain or headache. 08/15/14  Yes [name], MD  benazepril (LOTENSIN) 20 MG tablet TAKE (1) TABLET BY MOUTH ONCE DAILY. 01/14/21  Yes [name], MD  diltiazem (CARDIZEM CD) 180 MG 24 hr capsule TAKE 1 CAPSULE BY MOUTH ONCE DAILY. 01/14/21  Yes [name], MD  ELIQUIS 5 MG TABS tablet TAKE 1 TABLET BY MOUTH TWICE DAILY. 01/14/21  Yes [name], MD  ezetimibe (ZETIA) 10 MG tablet TAKE ONE TABLET BY MOUTH ONCE DAILY. 01/14/21  Yes [name], MD  FLUoxetine (PROZAC) 10 MG capsule Take 1 capsule (10 mg total) by mouth daily. 12/02/20  Yes [name], MD  KEPPRA 1000 MG tablet Take 1,000 mg by mouth 2 (two) times daily. 11/14/20  Yes [provider]  LAMICTAL 150 MG tablet TAKE 1 TABLET BY MOUTH ONCE DAILY. 03/07/20  Yes Perlie Mayo, NP  levothyroxine (SYNTHROID) 125 MCG tablet TAKE ONE TABLET BY MOUTH ONCE DAILY. 10/24/20  Yes Reardon, Juanetta Beets, NP  omeprazole (PRILOSEC) 20 MG capsule TAKE ONE CAPSULE BY MOUTH ONCE DAILY. 01/14/21  Yes [name], MD  potassium chloride SA (KLOR-CON M) 20 MEQ tablet Take 20 mEq by mouth daily. 10/18/20  Yes [provider]  rosuvastatin (CRESTOR) 40 MG tablet TAKE 1 TABLET BY MOUTH ONCE A DAY. Patient taking differently: Take 40 mg by mouth daily. 11/15/20  Yes [name], MD  potassium chloride (KLOR-CON) 10 MEQ tablet Take 10 mEq by mouth daily. Patient not taking: Reported on 01/25/2021 11/14/20   [provider]  Simms chair Bath mat 06/20/20   [name], MD    Allergies    Simvastatin  Review of Systems   Review of Systems  Neurological:  Positive for seizures.  All other systems reviewed and are negative.  Physical Exam Updated Vital Signs BP (!) 143/73   Pulse 83   Temp 100 F (37.8 C) (Oral)   Resp 19   Ht 5\' 5"  (1.651 m)   Wt 107.5 kg   SpO2 100%   BMI 39.44 kg/m   Physical Exam Vitals and nursing note reviewed.   Constitutional:      General: She is not in acute distress.    Appearance: Normal appearance.  HENT:     Head: Normocephalic and atraumatic.  Eyes:     General:        Right eye: No discharge.        Left eye: No discharge.  Cardiovascular:     Comments: Regular rate and rhythm.  S1/S2 are distinct without any evidence of murmur, rubs, or gallops.  Radial pulses are 2+ bilaterally.  Dorsalis pedis pulses are 2+ bilaterally.  No evidence of pedal edema. Pulmonary:  Comments: Clear to auscultation bilaterally.  Normal effort.  No respiratory distress.  No evidence of wheezes, rales, or rhonchi heard throughout. Abdominal:     General: Abdomen is flat. Bowel sounds are normal. There is no distension.     Tenderness: There is no abdominal tenderness. There is no guarding or rebound.  Musculoskeletal:        General: Normal range of motion.     Cervical back: Neck supple.  Skin:    General: Skin is warm and dry.     Findings: No rash.  Neurological:     General: No focal deficit present.     Mental Status: She is alert.  Psychiatric:        Mood and Affect: Mood normal.        Behavior: Behavior normal.    ED Results / Procedures / Treatments   Labs (all labs ordered are listed, but only abnormal results are displayed) Labs Reviewed  CBC WITH DIFFERENTIAL/PLATELET - Abnormal; Notable for the following components:      Result Value   Hemoglobin 11.3 (*)    All other components within normal limits  BASIC METABOLIC PANEL - Abnormal; Notable for the following components:   Potassium 5.6 (*)    CO2 21 (*)    Glucose, Bld 103 (*)    BUN 49 (*)    Creatinine, Ser 2.28 (*)    GFR, Estimated 23 (*)    All other components within normal limits  RESP PANEL BY RT-PCR (FLU A&B, COVID) ARPGX2  LEVETIRACETAM LEVEL  CBG MONITORING, ED    EKG None  Radiology No results found.  Procedures Procedures   Medications Ordered in ED Medications  sodium zirconium cyclosilicate  (LOKELMA) packet 5 g (has no administration in time range)    ED Course  I have reviewed the triage vital signs and the nursing notes.  Pertinent labs & imaging results that were available during my care of the patient were reviewed by me and considered in my medical decision making (see chart for details).    MDM Rules/Calculators/A&P                          [name] is a 65 y.o. female who presents to the emergency department after having a witnessed seizure.  Per the family, the patient is completely back to her baseline.  Patient answered all my questions appropriately and does not have any neurological deficits.  Concerned that she is been feeling under the weather I will swab her for COVID and get a point-of-care glucose.  In addition, I will get a Keppra level for her neurologist follow-up.   BC without any significant abnormality.  Mild chronic ongoing anemia.  Point-of-care glucose did not reveal any hyper or hypoglycemia.  Respiratory panel was negative.  BMP did show hyperkalemia.  Give the patient a dose of Lokelma in the department and will have her hold her benazepril over the weekend.  We will also have her follow-up with her PCP on Monday for repeat potassium level.  Creatinine and BUN were also elevated which shows chronic kidney disease has been ongoing for some time.  Ultimately, the patient feels improved and is safe for discharge.  Strict return precautions were given.  Final Clinical Impression(s) / ED Diagnoses Final diagnoses:  Seizure [facility])    Rx / [place] Orders ED Discharge Orders     None        Raul Del,  Leanora Cover, PA-C 01/25/21 2359    Daleen Bo, MD 01/26/21 [phone]

## 2021-01-25 NOTE — Discharge Instructions (Addendum)
Your potassium was elevated today.  I would like for you to hold your potassium supplementation and your benazepril over the weekend.  Please follow-up with your primary care doctor on Monday for repeat potassium level.  The rest of your labs were reassuring.  Please continue Keppra as prescribed.  Would like for you to follow-up with your neurologist at some point for possible medication adjustments.  Please return to the emergency room if you experience another seizure, trouble with your heart including chest pain or palpitations, trouble breathing, weakness/numbness to your upper or lower extremities, or any other concerns you might have.

## 2021-01-28 LAB — LEVETIRACETAM LEVEL: Levetiracetam Lvl: 40.9 ug/mL — ABNORMAL HIGH (ref 10.0–40.0)

## 2021-02-01 ENCOUNTER — Other Ambulatory Visit: Payer: Self-pay

## 2021-02-01 ENCOUNTER — Encounter: Payer: Self-pay | Admitting: Nurse Practitioner

## 2021-02-01 ENCOUNTER — Ambulatory Visit (INDEPENDENT_AMBULATORY_CARE_PROVIDER_SITE_OTHER): Payer: Medicare Other | Admitting: Nurse Practitioner

## 2021-02-01 VITALS — BP 131/78 | HR 82 | Resp 17 | Ht 65.0 in | Wt 240.0 lb

## 2021-02-01 DIAGNOSIS — R569 Unspecified convulsions: Secondary | ICD-10-CM

## 2021-02-01 DIAGNOSIS — Z7689 Persons encountering health services in other specified circumstances: Secondary | ICD-10-CM

## 2021-02-01 DIAGNOSIS — E875 Hyperkalemia: Secondary | ICD-10-CM | POA: Diagnosis not present

## 2021-02-01 DIAGNOSIS — I10 Essential (primary) hypertension: Secondary | ICD-10-CM

## 2021-02-01 NOTE — Assessment & Plan Note (Signed)
BP Readings from Last 3 Encounters:  02/01/21 131/78  01/26/21 (!) 148/90  11/29/20 112/67  continue current meds.

## 2021-02-01 NOTE — Progress Notes (Signed)
   [name]     MRN: [id number]      DOB: July 28, 1955   HPI [name] is here for follow up post ER visit for seizure. There are no specific complaints today.   Pt denies any seizure activity since her last visit to the ER, She had a follow up with her neurologist, no change was made to her Jodi Marble. She has resumed blood taking her benazepril today.   ROS Denies recent fever or chills. Denies sinus pressure, nasal congestion, ear pain or sore throat. Denies chest congestion, productive cough or wheezing. Denies chest pains, palpitations and leg swelling Denies abdominal pain, nausea, vomiting,diarrhea or constipation.   Denies dysuria, frequency, hesitancy or incontinence. Denies joint pain, swelling and limitation in mobility. Denies headaches, seizures, numbness, or tingling. Denies depression, anxiety or insomnia. Denies skin break down or rash.   PE  BP 131/78   Pulse 82   Resp 17   Ht 5\' 5"  (1.651 m)   Wt 240 lb (108.9 kg)   SpO2 95%   BMI 39.94 kg/m   Patient alert and oriented and in no cardiopulmonary distress.  HEENT: No facial asymmetry, EOMI,     Neck supple .  Chest: Clear to auscultation bilaterally.  CVS: S1, S2 no murmurs, no S3.Regular rate.  ABD: Soft non tender.   Ext: No edema  MS: Adequate ROM spine, shoulders, hips and knees.  Skin: Intact, no ulcerations or rash noted.  Psych: Good eye contact, normal affect. Memory intact not anxious or depressed appearing.  CNS: CN 2-12 intact, power,  normal throughout.no focal deficits noted.   Assessment & Plan

## 2021-02-01 NOTE — Assessment & Plan Note (Signed)
Managed by neurology . Takes keprra 1000mg  BID. Pt stated that had a visit with her neurology after her visit to the ED, no change was made to her keppra.

## 2021-02-01 NOTE — Assessment & Plan Note (Signed)
Recently seen in the ER for seizure, was hyperkalemic , recheck labs today. She had a follow up with her neurologist.

## 2021-02-01 NOTE — Addendum Note (Signed)
Addended by: Renee Rival on: 02/01/2021 02:29 PM   Modules accepted: Orders

## 2021-02-01 NOTE — Patient Instructions (Signed)
Please get your lab done today we will get back to you about your lab result.   It is important that you exercise regularly at least 30 minutes 5 times a week.  Think about what you will eat, plan ahead. Choose " clean, green, fresh or frozen" over canned, processed or packaged foods which are more sugary, salty and fatty. 70 to 75% of food eaten should be vegetables and fruit. Three meals at set times with snacks allowed between meals, but they must be fruit or vegetables. Aim to eat over a 12 hour period , example 7 am to 7 pm, and STOP after  your last meal of the day. Drink water,generally about 64 ounces per day, no other drink is as healthy. Fruit juice is best enjoyed in a healthy way, by EATING the fruit.  Thanks for choosing [facility], we consider it a privelige to serve you.

## 2021-02-02 ENCOUNTER — Other Ambulatory Visit: Payer: Self-pay | Admitting: Nurse Practitioner

## 2021-02-02 DIAGNOSIS — E875 Hyperkalemia: Secondary | ICD-10-CM

## 2021-02-02 LAB — CMP14+EGFR
ALT: 12 IU/L (ref 0–32)
AST: 18 IU/L (ref 0–40)
Albumin/Globulin Ratio: 1.3 (ref 1.2–2.2)
Albumin: 4.2 g/dL (ref 3.8–4.8)
Alkaline Phosphatase: 151 IU/L — ABNORMAL HIGH (ref 44–121)
BUN/Creatinine Ratio: 16 (ref 12–28)
BUN: 34 mg/dL — ABNORMAL HIGH (ref 8–27)
Bilirubin Total: 0.4 mg/dL (ref 0.0–1.2)
CO2: 21 mmol/L (ref 20–29)
Calcium: 9.9 mg/dL (ref 8.7–10.3)
Chloride: 109 mmol/L — ABNORMAL HIGH (ref 96–106)
Creatinine, Ser: 2.15 mg/dL — ABNORMAL HIGH (ref 0.57–1.00)
Globulin, Total: 3.2 g/dL (ref 1.5–4.5)
Glucose: 95 mg/dL (ref 70–99)
Potassium: 5.3 mmol/L — ABNORMAL HIGH (ref 3.5–5.2)
Sodium: 143 mmol/L (ref 134–144)
Total Protein: 7.4 g/dL (ref 6.0–8.5)
eGFR: 25 mL/min/{1.73_m2} — ABNORMAL LOW (ref >59)

## 2021-02-02 MED ORDER — LOKELMA 5 G PO PACK: 5.0000 g | PACK | Freq: Once | ORAL | 0 refills | Status: AC

## 2021-02-02 NOTE — Progress Notes (Signed)
Potassium is still slightly high. I sent Lokelmia 5mg  to her pharmacy, one time dose only. Take med 2 hours before or after taking other medications. Avoid drinking orange juice or drinks fortified with potassium. Thanks.

## 2021-02-07 NOTE — Progress Notes (Addendum)
Cardiology Office Note    Date:  02/08/2021   ID:  [name], [name] [birth year], MRN [id number]  PCP:  [name], MD  Cardiologist: Carlyle Dolly, MD    Chief Complaint  Patient presents with   Follow-up    Overdue Visit    History of Present Illness:    [name] is a 65 y.o. female with past medical history of paroxysmal atrial fibrillation (diagnosed in 07/2019 while admitted for SBO), HTN, HLD, hypothyroidism, seizure disorder and Stage 3-4 CKD who presents to the office today for overdue follow-up.  She was examined by Dr. Harl Bowie in 02/2020 as a new patient referral for prior diagnosis of atrial fibrillation and denied any recent symptoms at that time. It was recommended that if she did not have clear recurrence at follow-up visits, could consider an outpatient monitor to see if recurrent atrial fibrillation and if no recurrence, could possibly discontinue Eliquis. Amlodipine was discontinued since she was on Cardizem.  In talking with the patient today, she reports overall doing well from a cardiac perspective since her last visit. Does not exercise routinely but walks down the street on occasion and denies any chest pain or dyspnea on exertion with this. No recent orthopnea, PND, pitting edema or palpitations. Reports she does have pain along her right knee and takes Tylenol with improvement in her symptoms. Was in the ER recently with a seizure but denies any recurrence since and reports good compliance with her current medications.    Past Medical History:  Diagnosis Date   A-fib ([place])    Anemia    Depression    Dyspnea    Fractures    Hyperlipidemia    Hypertension    Mental retardation, mild (I.Q. 50-70)    lives alone and cares for self has a nurse aide comes once a day    Obesity    Pneumonia    PONV (postoperative nausea and vomiting)    Seizure ([place])    11/17/2012 "still having them; "I believe their from a tumor in my head" ((11/17/2012    Seizure disorder ([place])    Thyroid cancer [facility])     Past Surgical History:  Procedure Laterality Date   ABDOMINAL HYSTERECTOMY  2001   BREAST LUMPECTOMY  1996   right    COLONOSCOPY N/A 05/16/2016   Dr. Oneida Alar: Redundant colon, hemorrhoids, next colonoscopy 10 years   INGUINAL HERNIA REPAIR N/A 08/12/2019   Procedure: laparoscopic incisional hernia repair;  Surgeon: Lucas Mallow, MD;  Location: [facility] ORS;  Service: Urology;  Laterality: N/A;   ROBOTIC ASSITED PARTIAL NEPHRECTOMY Right 08/05/2019   Procedure: XI ROBOTIC ASSITED PARTIAL NEPHRECTOMY WITH INTRAOPERATIVE ULTRASOUND;  Surgeon: [name], MD;  Location: [facility];  Service: Urology;  Laterality: Right;  3 HRS   THYROIDECTOMY Bilateral 11/17/2012   Procedure: TOTAL THYROIDECTOMY;  Surgeon: Ascencion Dike, MD;  Location: [facility] OR;  Service: ENT;  Laterality: Bilateral;   TOTAL THYROIDECTOMY Bilateral 11/17/2012   TUBAL LIGATION      Current Medications: Outpatient Medications Prior to Visit  Medication Sig Dispense Refill   acetaminophen (TYLENOL) 500 MG tablet Take 1 tablet (500 mg total) by mouth 2 (two) times daily. (Patient taking differently: Take 500 mg by mouth 2 (two) times daily as needed for mild pain or headache.) 60 tablet 5   benazepril (LOTENSIN) 20 MG tablet TAKE (1) TABLET BY MOUTH ONCE DAILY. 30 tablet 3   diltiazem (CARDIZEM CD) 180 MG 24  hr capsule TAKE 1 CAPSULE BY MOUTH ONCE DAILY. 30 capsule 3   ELIQUIS 5 MG TABS tablet TAKE 1 TABLET BY MOUTH TWICE DAILY. 60 tablet 3   ezetimibe (ZETIA) 10 MG tablet TAKE ONE TABLET BY MOUTH ONCE DAILY. 30 tablet 3   FLUoxetine (PROZAC) 10 MG capsule Take 1 capsule (10 mg total) by mouth daily. 30 capsule 5   KEPPRA 1000 MG tablet Take 1,000 mg by mouth 2 (two) times daily.     LAMICTAL 150 MG tablet TAKE 1 TABLET BY MOUTH ONCE DAILY. 30 tablet 0   levothyroxine (SYNTHROID) 125 MCG tablet TAKE ONE TABLET BY MOUTH ONCE DAILY. 90 tablet 0   omeprazole (PRILOSEC) 20 MG capsule TAKE  ONE CAPSULE BY MOUTH ONCE DAILY. 30 capsule 3   rosuvastatin (CRESTOR) 40 MG tablet TAKE 1 TABLET BY MOUTH ONCE A DAY. (Patient taking differently: Take 40 mg by mouth daily.) 30 tablet 0   UNABLE TO FIND HHS Bath chair Bath mat 1 each 0   No facility-administered medications prior to visit.     Allergies:   Simvastatin   Social History   Socioeconomic History   Marital status: Single    Spouse name: Not on file   Number of children: 2   Years of education: Not on file   Highest education level: Not on file  Occupational History   Occupation: disabled   Tobacco Use   Smoking status: Never   Smokeless tobacco: Never  Vaping Use   Vaping Use: Never used  Substance and Sexual Activity   Alcohol use: No   Drug use: No   Sexual activity: Never  Other Topics Concern   Not on file  Social History Narrative   Not on file   Social Determinants of Health   Financial Resource Strain: Low Risk    Difficulty of Paying Living Expenses: Not hard at all  Food Insecurity: No Food Insecurity   Worried About Charity fundraiser in the Last Year: Never true   [place] in the Last Year: Never true  Transportation Needs: No Transportation Needs   Lack of Transportation (Medical): No   Lack of Transportation (Non-Medical): No  Physical Activity: Sufficiently Active   Days of Exercise per Week: 7 days   Minutes of Exercise per Session: 60 min  Stress: No Stress Concern Present   Feeling of Stress : Not at all  Social Connections: Socially Isolated   Frequency of Communication with Friends and Family: More than three times a week   Frequency of Social Gatherings with Friends and Family: Twice a week   Attends Religious Services: Never   Marine scientist or Organizations: No   Attends Music therapist: Never   Marital Status: Never married     Family History:  The patient's family history includes Diabetes in her mother.   Review of Systems:    Please  see the history of present illness.     All other systems reviewed and are otherwise negative except as noted above.   Physical Exam:    VS:  BP 134/82    Pulse 86    Ht 5\' 5"  (1.651 m)    Wt 238 lb (108 kg)    SpO2 98%    BMI 39.61 kg/m    General: Well developed, well nourished,female appearing in no acute distress. Head: Normocephalic, atraumatic. Neck: No carotid bruits. JVD not elevated.  Lungs: Respirations regular and unlabored, without wheezes or  rales.  Heart: Regular rate and rhythm. No S3 or S4.  No murmur, no rubs, or gallops appreciated. Abdomen: Appears non-distended. No obvious abdominal masses. Msk:  Strength and tone appear normal for age. No obvious joint deformities or effusions. Extremities: No clubbing or cyanosis. Trace ankle edema bilaterally.  Distal pedal pulses are 2+ bilaterally. Neuro: Alert and oriented X 3. Moves all extremities spontaneously. No focal deficits noted. Psych:  Responds to questions appropriately with a normal affect. Skin: No rashes or lesions noted  Wt Readings from Last 3 Encounters:  02/08/21 238 lb (108 kg)  02/01/21 240 lb (108.9 kg)  01/25/21 237 lb (107.5 kg)     Studies/Labs Reviewed:   EKG:  EKG is not ordered today.   Recent Labs: 09/12/2020: TSH 0.356 01/25/2021: Hemoglobin 11.3; Platelets 184 02/01/2021: ALT 12; BUN 34; Creatinine, Ser 2.15; Potassium 5.3; Sodium 143   Lipid Panel    Component Value Date/Time   CHOL 147 11/30/2020 0831   TRIG 60 11/30/2020 0831   HDL 48 11/30/2020 0831   CHOLHDL 3.1 11/30/2020 0831   CHOLHDL 3.3 10/06/2019 0951   VLDL 14 01/29/2016 1109   LDLCALC 87 11/30/2020 0831   LDLCALC 80 10/06/2019 0951    Additional studies/ records that were reviewed today include:   Echocardiogram: 07/2019 IMPRESSIONS     1. Hyperdynamic LV systolic function; elevated mean gradient across  aortic valve (19 mmHg) suggests mild AS; however hyperdynamic LV function  at least partially contributing;  trace AI; mild TR with severely elevated  pulmonary pressure.   2. Left ventricular ejection fraction, by estimation, is >75%. The left  ventricle has hyperdynamic function. The left ventricle has no regional  wall motion abnormalities. Left ventricular diastolic parameters were  normal.   3. Right ventricular systolic function is normal. The right ventricular  size is normal. There is severely elevated pulmonary artery systolic  pressure.   4. The mitral valve is normal in structure. Trivial mitral valve  regurgitation. No evidence of mitral stenosis.   5. The aortic valve is tricuspid. Aortic valve regurgitation is trivial.  Mild aortic valve stenosis.   6. The inferior vena cava is normal in size with greater than 50%  respiratory variability, suggesting right atrial pressure of 3 mmHg.   Assessment:    1. PAF (paroxysmal atrial fibrillation) ([place])   2. Essential hypertension   3. Mixed hyperlipidemia   4. CKD (chronic kidney disease) stage 4, GFR 15-29 ml/min (HCC)   5. Hyperkalemia      Plan:   In order of problems listed above:  1. Paroxysmal Atrial Fibrillation - This occurred during her admission in 07/2019 for an SBO and no known recurrence since. Reviewed recommendations from Dr. Harl Bowie with the patient in regards to a follow-up monitor prior to stopping Eliquis but she does not wish to pursue a monitor at this time and feels well with her current medications and wishes to continue her current regimen at this time. I encouraged her to make [place] aware if she changes her mind and wishes to pursue a monitor in the future. Will continue Cardizem CD 180mg  daily.  - No reports of active bleeding. Continue Eliquis 5mg  BID for anticoagulation. Labs earlier this month showed Hgb was stable at 11.3 and platelets at 184 K.   2. HTN - BP initially elevated, rechecked and improved to 134/82. Continue current medication regimen with Cardizem CD 180mg  daily and Benazepril 20mg  daily. Will  defer continuation of Benazepril to Nephrology in  the setting of her CKD.   3. HLD - Followed by her PCP. FLP in 11/2020 showed total cholesterol of 147, triglycerides 147, HDL 48 and LDL 87. Continue current medication regimen with Crestor 40mg  daily and Zetia 10mg  daily.   4. Stage 3-4 CKD/Hyperkalemia - Baseline creatinine 2.1 - 2.2. Followed by Dr. Theador Hawthorne. K+ was at 5.3 on 02/01/2021 and she was prescribed Lokelma by her PCP. She does not have any follow-up labs scheduled, therefore will plan for a BMET next week.    Medication Adjustments/Labs and Tests Ordered: Current medicines are reviewed at length with the patient today.  Concerns regarding medicines are outlined above.  Medication changes, Labs and Tests ordered today are listed in the Patient Instructions below. Patient Instructions  Medication Instructions:  Your physician recommends that you continue on your current medications as directed. Please refer to the Current Medication list given to you today.   Labwork:  BMET in 1 week at [facility]   Testing/Procedures: None today   Follow-Up: 1 year  Any Other Special Instructions Will Be Listed Below (If Applicable).  If you need a refill on your cardiac medications before your next appointment, please call your pharmacy.   Signed, Erma Heritage, PA-C  02/08/2021 1:24 PM    [place] [address] [place], [place] [address] Phone: [phone] Fax: [phone]

## 2021-02-08 ENCOUNTER — Ambulatory Visit (INDEPENDENT_AMBULATORY_CARE_PROVIDER_SITE_OTHER): Payer: Medicare Other | Admitting: Student

## 2021-02-08 ENCOUNTER — Encounter: Payer: Self-pay | Admitting: Student

## 2021-02-08 ENCOUNTER — Other Ambulatory Visit: Payer: Self-pay

## 2021-02-08 VITALS — BP 134/82 | HR 86 | Ht 65.0 in | Wt 238.0 lb

## 2021-02-08 DIAGNOSIS — E782 Mixed hyperlipidemia: Secondary | ICD-10-CM

## 2021-02-08 DIAGNOSIS — I480 Paroxysmal atrial fibrillation: Secondary | ICD-10-CM | POA: Diagnosis not present

## 2021-02-08 DIAGNOSIS — N184 Chronic kidney disease, stage 4 (severe): Secondary | ICD-10-CM | POA: Diagnosis not present

## 2021-02-08 DIAGNOSIS — E875 Hyperkalemia: Secondary | ICD-10-CM

## 2021-02-08 DIAGNOSIS — I10 Essential (primary) hypertension: Secondary | ICD-10-CM | POA: Diagnosis not present

## 2021-02-08 NOTE — Patient Instructions (Signed)
Medication Instructions:  Your physician recommends that you continue on your current medications as directed. Please refer to the Current Medication list given to you today.   Labwork:  BMET in 1 week at [facility]   Testing/Procedures: None today   Follow-Up: 1 year  Any Other Special Instructions Will Be Listed Below (If Applicable).  If you need a refill on your cardiac medications before your next appointment, please call your pharmacy.

## 2021-02-11 ENCOUNTER — Other Ambulatory Visit: Payer: Self-pay | Admitting: *Deleted

## 2021-02-11 DIAGNOSIS — E875 Hyperkalemia: Secondary | ICD-10-CM

## 2021-02-12 ENCOUNTER — Telehealth: Payer: Self-pay

## 2021-02-12 LAB — BASIC METABOLIC PANEL
BUN/Creatinine Ratio: 19 (calc) (ref 6–22)
BUN: 41 mg/dL — ABNORMAL HIGH (ref 7–25)
CO2: 19 mmol/L — ABNORMAL LOW (ref 20–32)
Calcium: 9.7 mg/dL (ref 8.6–10.4)
Chloride: 109 mmol/L (ref 98–110)
Creat: 2.21 mg/dL — ABNORMAL HIGH (ref 0.50–1.05)
Glucose, Bld: 71 mg/dL (ref 65–99)
Potassium: 4.8 mmol/L (ref 3.5–5.3)
Sodium: 140 mmol/L (ref 135–146)

## 2021-02-12 NOTE — Telephone Encounter (Signed)
Patient notified and verbalized understanding. Patient had no questions or concerns at this time.  

## 2021-02-12 NOTE — Telephone Encounter (Signed)
-----   Message from Erma Heritage, [place] sent at 02/12/2021  8:24 AM EST ----- Please let the patient know her kidney function remains stable with compared to prior labs (CKD followed by Dr. Theador Hawthorne). Potassium is now within a normal range. Continue current medication regimen.

## 2021-02-13 ENCOUNTER — Other Ambulatory Visit: Payer: Self-pay | Admitting: Nurse Practitioner

## 2021-02-13 ENCOUNTER — Other Ambulatory Visit: Payer: Self-pay | Admitting: Family Medicine

## 2021-03-05 ENCOUNTER — Encounter: Payer: Self-pay | Admitting: Family Medicine

## 2021-03-05 ENCOUNTER — Other Ambulatory Visit: Payer: Self-pay

## 2021-03-05 ENCOUNTER — Encounter (INDEPENDENT_AMBULATORY_CARE_PROVIDER_SITE_OTHER): Payer: Self-pay

## 2021-03-05 ENCOUNTER — Ambulatory Visit (INDEPENDENT_AMBULATORY_CARE_PROVIDER_SITE_OTHER): Payer: Medicare Other | Admitting: Family Medicine

## 2021-03-05 DIAGNOSIS — J309 Allergic rhinitis, unspecified: Secondary | ICD-10-CM

## 2021-03-05 DIAGNOSIS — I10 Essential (primary) hypertension: Secondary | ICD-10-CM

## 2021-03-05 MED ORDER — MONTELUKAST SODIUM 10 MG PO TABS: 10.0000 mg | ORAL_TABLET | Freq: Every day | ORAL | 3 refills | Status: DC

## 2021-03-05 MED ORDER — CHLORPHENIRAMINE MALEATE 4 MG PO TABS: ORAL_TABLET | ORAL | 0 refills | Status: DC

## 2021-03-05 NOTE — Assessment & Plan Note (Signed)
DASH diet and commitment to daily physical activity for a minimum of 30 minutes discussed and encouraged, as a part of hypertension management. The importance of attaining a healthy weight is also discussed.  BP/Weight 02/08/2021 02/01/2021 01/26/2021 01/25/2021 11/29/2020 11/01/9478 03/02/5535  Systolic BP 482 707 867 - 544 920 100  Diastolic BP 82 78 90 - 67 82 73  Wt. (Lbs) 238 240 - 237 237.8 236.8 229.8  BMI 39.61 39.94 - 39.44 39.57 39.41 38.24

## 2021-03-05 NOTE — Progress Notes (Signed)
Virtual Visit via Telephone Note  I connected with [name] on 03/05/21 at  8:20 AM EST by telephone and verified that I am speaking with the correct person using two identifiers.  Location: Patient: home Provider: office   I discussed the limitations, risks, security and privacy concerns of performing an evaluation and management service by telephone and the availability of in person appointments. I also discussed with the patient that there may be a patient responsible charge related to this service. The patient expressed understanding and agreed to proceed.   History of Present Illness: Burning eyes and runny nose x 6 days, chills yesterday, bac k is burning, no known fever, sneezing all the time  Denies ear pain, sore through or productive cough, on  no medication for symptoms Observations/Objective: There were no vitals taken for this visit. Good communication with no confusion and intact memory. Alert and oriented x 3 No signs of respiratory distress during speech   Assessment and Plan:  Allergic rhinitis Uncontrolled start daily singulair and limited chlorpheiramine as needed  Essential hypertension DASH diet and commitment to daily physical activity for a minimum of 30 minutes discussed and encouraged, as a part of hypertension management. The importance of attaining a healthy weight is also discussed.  BP/Weight 02/08/2021 02/01/2021 01/26/2021 01/25/2021 11/29/2020 09/25/6413 09/27/938  Systolic BP 768 088 110 - 315 945 859  Diastolic BP 82 78 90 - 67 82 73  Wt. (Lbs) 238 240 - 237 237.8 236.8 229.8  BMI 39.61 39.94 - 39.44 39.57 39.41 38.24       Follow Up Instructions:    I discussed the assessment and treatment plan with the patient. The patient was provided an opportunity to ask questions and all were answered. The patient agreed with the plan and demonstrated an understanding of the instructions.   The patient was advised to call back or seek an in-person  evaluation if the symptoms worsen or if the condition fails to improve as anticipated.  I provided 7 minutes of non-face-to-face time during this encounter.   Tula Nakayama, MD

## 2021-03-05 NOTE — Patient Instructions (Signed)
F/U in march as before, call if you need me sooner  Two medications, montelukast and chlorpheniramine are prescribed fo uncontrolled allergy symptoms  Please ensure that you drink 64 ounces water daily  Thanks for choosing [facility], we consider it a privelige to serve you.

## 2021-03-05 NOTE — Assessment & Plan Note (Signed)
Uncontrolled start daily singulair and limited chlorpheiramine as needed

## 2021-03-18 ENCOUNTER — Other Ambulatory Visit: Payer: Self-pay | Admitting: Family Medicine

## 2021-03-23 LAB — TSH: TSH: 0.2 u[IU]/mL — ABNORMAL LOW (ref 0.450–4.500)

## 2021-03-23 LAB — T4, FREE: Free T4: 1.87 ng/dL — ABNORMAL HIGH (ref 0.82–1.77)

## 2021-03-27 ENCOUNTER — Encounter: Payer: Self-pay | Admitting: Nurse Practitioner

## 2021-03-27 ENCOUNTER — Other Ambulatory Visit: Payer: Self-pay

## 2021-03-27 ENCOUNTER — Ambulatory Visit (INDEPENDENT_AMBULATORY_CARE_PROVIDER_SITE_OTHER): Payer: Medicare Other | Admitting: Nurse Practitioner

## 2021-03-27 VITALS — BP 138/81 | HR 92 | Ht 65.0 in | Wt 241.4 lb

## 2021-03-27 DIAGNOSIS — C73 Malignant neoplasm of thyroid gland: Secondary | ICD-10-CM

## 2021-03-27 DIAGNOSIS — E890 Postprocedural hypothyroidism: Secondary | ICD-10-CM | POA: Diagnosis not present

## 2021-03-27 MED ORDER — LEVOTHYROXINE SODIUM 125 MCG PO TABS: 125.0000 ug | ORAL_TABLET | Freq: Every day | ORAL | 1 refills | Status: DC

## 2021-03-27 NOTE — Progress Notes (Signed)
03/27/2021      Endocrinology follow-up note     Subjective:    Patient ID: [name], female    DOB: Jan 13, 1956,    Past Medical History:  Diagnosis Date   A-fib ([place])    Anemia    Depression    Dyspnea    Fractures    Hyperlipidemia    Hypertension    Mental retardation, mild (I.Q. 50-70)    lives alone and cares for self has a nurse aide comes once a day    Obesity    Pneumonia    PONV (postoperative nausea and vomiting)    Seizure ([place])    11/17/2012 "still having them; "I believe their from a tumor in my head" ((11/17/2012   Seizure disorder ([place])    Thyroid cancer [facility])    Past Surgical History:  Procedure Laterality Date   ABDOMINAL HYSTERECTOMY  2001   BREAST LUMPECTOMY  1996   right    COLONOSCOPY N/A 05/16/2016   Dr. Oneida Alar: Redundant colon, hemorrhoids, next colonoscopy 10 years   INGUINAL HERNIA REPAIR N/A 08/12/2019   Procedure: laparoscopic incisional hernia repair;  Surgeon: Lucas Mallow, MD;  Location: [facility];  Service: Urology;  Laterality: N/A;   ROBOTIC ASSITED PARTIAL NEPHRECTOMY Right 08/05/2019   Procedure: XI ROBOTIC ASSITED PARTIAL NEPHRECTOMY WITH INTRAOPERATIVE ULTRASOUND;  Surgeon: [name], MD;  Location: [facility];  Service: Urology;  Laterality: Right;  3 HRS   THYROIDECTOMY Bilateral 11/17/2012   Procedure: TOTAL THYROIDECTOMY;  Surgeon: Ascencion Dike, MD;  Location: [facility] OR;  Service: ENT;  Laterality: Bilateral;   TOTAL THYROIDECTOMY Bilateral 11/17/2012   TUBAL LIGATION     Social History   Socioeconomic History   Marital status: Single    Spouse name: Not on file   Number of children: 2   Years of education: Not on file   Highest education level: Not on file  Occupational History   Occupation: disabled   Tobacco Use   Smoking status: Never   Smokeless tobacco: Never  Vaping Use   Vaping Use: Never used  Substance and Sexual Activity   Alcohol use: No   Drug use: No   Sexual activity: Never  Other Topics Concern    Not on file  Social History Narrative   Not on file   Social Determinants of Health   Financial Resource Strain: Low Risk    Difficulty of Paying Living Expenses: Not hard at all  Food Insecurity: No Food Insecurity   Worried About Charity fundraiser in the Last Year: Never true   [place] in the Last Year: Never true  Transportation Needs: No Transportation Needs   Lack of Transportation (Medical): No   Lack of Transportation (Non-Medical): No  Physical Activity: Sufficiently Active   Days of Exercise per Week: 7 days   Minutes of Exercise per Session: 60 min  Stress: No Stress Concern Present   Feeling of Stress : Not at all  Social Connections: Socially Isolated   Frequency of Communication with Friends and Family: More than three times a week   Frequency of Social Gatherings with Friends and Family: Twice a week   Attends Religious Services: Never   Marine scientist or Organizations: No   Attends Archivist Meetings: Never   Marital Status: Never married   Outpatient Encounter Medications as of 03/27/2021  Medication Sig   acetaminophen (TYLENOL) 500 MG tablet Take 1 tablet (500 mg  total) by mouth 2 (two) times daily. (Patient taking differently: Take 500 mg by mouth 2 (two) times daily as needed for mild pain or headache.)   benazepril (LOTENSIN) 20 MG tablet TAKE (1) TABLET BY MOUTH ONCE DAILY.   chlorpheniramine (CHLOR-TRIMETON) 4 MG tablet Take one tablet once daily for 3 days , for nasal congestion, then take one tablet once daily, as needed, for excess nasal drainage   cinacalcet (SENSIPAR) 30 MG tablet Take by mouth.   diltiazem (CARDIZEM CD) 180 MG 24 hr capsule TAKE 1 CAPSULE BY MOUTH ONCE DAILY.   ELIQUIS 5 MG TABS tablet TAKE 1 TABLET BY MOUTH TWICE DAILY.   ezetimibe (ZETIA) 10 MG tablet TAKE ONE TABLET BY MOUTH ONCE DAILY.   FLUoxetine (PROZAC) 10 MG capsule Take 1 capsule (10 mg total) by mouth daily.   KEPPRA 1000 MG tablet Take 1,000  mg by mouth 2 (two) times daily.   LAMICTAL 150 MG tablet TAKE 1 TABLET BY MOUTH ONCE DAILY.   montelukast (SINGULAIR) 10 MG tablet Take 1 tablet (10 mg total) by mouth at bedtime.   omeprazole (PRILOSEC) 20 MG capsule TAKE ONE CAPSULE BY MOUTH ONCE DAILY.   potassium chloride (KLOR-CON) 10 MEQ tablet Take 10 mEq by mouth daily.   rosuvastatin (CRESTOR) 40 MG tablet TAKE 1 TABLET BY MOUTH ONCE A DAY.   UNABLE TO FIND HHS Bath chair Bath mat   [DISCONTINUED] levothyroxine (SYNTHROID) 125 MCG tablet TAKE ONE TABLET BY MOUTH ONCE DAILY.   levothyroxine (SYNTHROID) 125 MCG tablet Take 1 tablet (125 mcg total) by mouth daily.   No facility-administered encounter medications on file as of 03/27/2021.   ALLERGIES: Allergies  Allergen Reactions   Simvastatin Other (See Comments)    Muscle aches   VACCINATION STATUS: Immunization History  Administered Date(s) Administered   Fluad Quad(high Dose 65+) 11/29/2019, 11/29/2020   H1N1 12/16/2007   Influenza Split 11/24/2011   Influenza Whole 11/30/2006, 12/07/2008, 12/26/2009   Influenza,inj,Quad PF,6+ Mos 11/18/2012, 12/06/2013, 05/23/2015, 01/29/2016, 12/01/2016, 01/18/2018, 12/01/2018   Pneumococcal Conjugate-13 04/03/2014   Pneumococcal Polysaccharide-23 05/10/2019   Td 08/22/2003   Tdap 04/02/2016   Unspecified SARS-COV-2 Vaccination 05/30/2019, 04/20/2020    HPI  66 year old female patient with medical history as above.  She is here to follow-up for post surgical hypothyroidism and for follow-up of Papillary thyroid cancer.  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 She has had thyrogen stimulated remnant ablation with negative post therapy WBS in 2014.  - Second Thyrogen Stimulated whole body scan for surveillance shows no scintigraphic evidence of iodine avid metastasis papillary thyroid cancer on  04/18/2016.  - Most recent surveillance imaging with Thyrogen stimulated whole-body scan on September 27, 2018 showed no evidence of tumor recurrence.  -Her ultrasound from November 07, 2019 was consistent with surgical changes with no new sign of recurrence or residual tumor.  Her last thyroid imaging on November 07, 2019 also confirmed stable atrophic heterogenous residual right thyroid lobe measuring 2.5 cm x 1.2 cm.  Stability documented since 2019.    She is now on Synthroid 125 mcg p.o. daily before breakfast.  -She has steady weight since last visit.  She has no new complaints.   Review of systems  Constitutional: + Minimally fluctuating body weight,  current Body mass index is 40.17 kg/m. , no fatigue, no subjective hyperthermia, no subjective hypothermia Eyes: no blurry vision, no xerophthalmia ENT:  no sore throat, no nodules palpated in throat, no dysphagia/odynophagia, no hoarseness Cardiovascular: no chest pain, no shortness of breath, no palpitations, no leg swelling Respiratory: no cough, no shortness of breath Gastrointestinal: no nausea/vomiting/diarrhea Musculoskeletal: no muscle/joint aches Skin: no rashes, no hyperemia Neurological: no tremors, no numbness, no tingling, no dizziness Psychiatric: no depression, no anxiety  Objective:    BP 138/81    Pulse 92    Ht 5\' 5"  (1.651 m)    Wt 241 lb 6.4 oz (109.5 kg)    SpO2 99%    BMI 40.17 kg/m   Wt Readings from Last 3 Encounters:  03/27/21 241 lb 6.4 oz (109.5 kg)  02/08/21 238 lb (108 kg)  02/01/21 240 lb (108.9 kg)    BP Readings from Last 3 Encounters:  03/27/21 138/81  02/08/21 134/82  02/01/21 131/78    Physical Exam- Limited  Constitutional:  Body mass index is 40.17 kg/m. , not in acute distress, normal state of mind Eyes:  EOMI, no exophthalmos Neck: Supple Thyroid: scar from previous thyroidectomy Cardiovascular: RRR, no murmurs, rubs, or gallops, no edema Respiratory: Adequate breathing efforts,  no crackles, rales, rhonchi, or wheezing Musculoskeletal: no gross deformities, strength intact in all four extremities, no gross restriction of joint movements Skin:  no rashes, no hyperemia Neurological: no tremor with outstretched hands   Results for orders placed or performed in visit on 41/28/78  Basic metabolic panel  Result Value Ref Range   Glucose, Bld 71 65 - 99 mg/dL   BUN 41 (H) 7 - 25 mg/dL   Creat 2.21 (H) 0.50 - 1.05 mg/dL   BUN/Creatinine Ratio 19 6 - 22 (calc)   Sodium 140 135 - 146 mmol/L   Potassium 4.8 3.5 - 5.3 mmol/L   Chloride 109 98 - 110 mmol/L   CO2 19 (L) 20 - 32 mmol/L   Calcium 9.7 8.6 - 10.4 mg/dL   Complete Blood Count (Most recent): Lab Results  Component Value Date   WBC 5.6 01/25/2021   HGB 11.3 (L) 01/25/2021   HCT 36.1 01/25/2021   MCV 89.4 01/25/2021   PLT 184 01/25/2021   Diabetic Labs (most recent): Lab Results  Component Value Date   HGBA1C 5.2 05/26/2017   HGBA1C 5.3 08/10/2012   Lipid Panel     Component Value Date/Time   CHOL 147 11/30/2020 0831   TRIG 60 11/30/2020 0831   HDL 48 11/30/2020 0831   CHOLHDL 3.1 11/30/2020 0831   CHOLHDL 3.3 10/06/2019 0951   VLDL 14 01/29/2016 1109   LDLCALC 87 11/30/2020 0831   LDLCALC 80 10/06/2019 0951    June 01, 2017  IMPRESSION:  Questioned approximately 2.2 cm soft tissue within the right lobectomy resection bed appears similar to the 07/2015 examination.   Note, while nonspecific, this tissue was apparently present at the time of the Thyrogen stimulated I 131 nuclear medicine whole body scan performed 03/2016 which was negative for the presence of iodine avid metastatic thyroid cancer or residual disease, and thus favored to be benign etiology. Clinical correlation is advised.  Thyrogen stimulated whole-body scan September 27, 2018 fINDINGS:  Excreted tracer within colon and urinary bladder. Small amount of retained gastric activity. Pattern is similar to that seen on the  previous exam.   No sites of iodine-avid metastatic thyroid cancer identified. No residual tracer accumulation in the neck.   IMPRESSION: No scintigraphic evidence of iodine-avid metastatic thyroid cancer.     Ultrasound of thyroid on November 07, 2019 -Right  lobe 2.5 x 1.2 x 0.9 cm, left lobe surgically absent. Stable atrophic heterogeneous residual right thyroid lobe as before. No hypervascularity. No left thyroid bed abnormality. No regional adenopathy.   IMPRESSION: Stable thyroid ultrasound compared 2019 as above.  Results for EARLYN, SYLVAN (MRN [id number]) as of 09/24/2020 09:20  Ref. Range 09/12/2020 10:02  TSH Latest Ref Range: 0.450 - 4.500 uIU/mL 0.356 (L)  T4,Free(Direct) Latest Ref Range: 0.82 - 1.77 ng/dL 1.49    Latest Reference Range & Units 08/15/19 08:21 11/03/19 09:49 03/08/20 09:04 09/12/20 10:02 03/22/21 08:30  TSH 0.450 - 4.500 uIU/mL 0.819 0.19 (L) 0.538 0.356 (L) 0.200 (L)  T4,Free(Direct) 0.82 - 1.77 ng/dL  1.9 (H) 1.76 1.49 1.87 (H)  Thyroglobulin ng/mL  0.3 (L)     Thyroglobulin Ab < or = 1 IU/mL  <1     THYROGLOBULIN (TG-RIA) ng/mL   <2.0    (L): Data is abnormally low (H): Data is abnormally high Assessment & Plan:   1. Hypothyroidism, postsurgical -Her previsit thyroid function tests are consistent with slight over-replacement, however given her lack of symptoms, she will be kept on same dose of thyroid medication for now (appropriate for this season).  She is advised to continue her Levothyroxine 125 mcg po daily before breakfast.    - We discussed about the correct intake of her thyroid hormone, on empty stomach at fasting, with water, separated by at least 30 minutes from breakfast and other medications,  and separated by more than 4 hours from calcium, iron, multivitamins, acid reflux medications (PPIs). -Patient is made aware of the fact that thyroid hormone replacement is needed for life, dose to be adjusted by periodic monitoring of thyroid  function tests.  - Target TSH for her would be between 0.1-0.5.  2. Papillary thyroid carcinoma ([place]) Her surveillance neck /thyroid u/s from 11/17/2013 was c/w surgically absent thyroid, however there is a 0.9 cm left cervical lymph node. See below.  Pt is s/p thyrogen stimulated thyroid remnant ablation with WBS on 01/14/2013 with no evidence of iodine avid metastasis. She has had pT2,Nx,Mx FVPTC, s/p near total thyroidectomy on September 24,2014. She is counseled to maintain high degree of compliance with synthroid therapy with aim of keeping TSH low normal ( 0.1-0.5).  -she is advised on the necessity of follow-ups and imaging studies at least one annually for the next 5 years. - She was sent for core biopsy of 0.9 cm left cervical lymph node last visit. However, this procedure was abandoned due to " no evidence of adenopathy".  The previously noticed  left cervical lymph node was sent to decrease in size to 0.5 cm. Medicaid did not cover surveillance ultrasound. - Second Thyrogen estimated or body scan for surveillance shows no scintigraphic evidence of iodine avid metastasis papillary thyroid cancer on 04/18/2016. -June 01, 2017 surveillance thyroid/neck ultrasound : Unremarkable, see above.   -She underwent  Thyrogen stimulated whole body scan which is negative for tumor recurrence.   Her recent thyroid/neck ultrasound from September 2021 showed stable atrophic heterogenous residual thyroid lobe measuring 2.5 x1.2 cm observed to have been stable since 2019.  She will not need any immediate intervention for this at this time.   She is due for surveillance thyroid ultrasound prior to next visit.  She is advised to keep close follow-up with Dr. Tula Nakayama for primary care needs.     I spent 30 minutes in the care of the patient today including review of labs from Thyroid Function, CMP,  and other relevant labs ; imaging/biopsy records (current and previous including abstractions  from other facilities); face-to-face time discussing  her lab results and symptoms, medications doses, her options of short and long term treatment based on the latest standards of care / guidelines;   and documenting the encounter.  Delrae Alfred Proffit  participated in the discussions, expressed understanding, and voiced agreement with the above plans.  All questions were answered to her satisfaction. she is encouraged to contact clinic should she have any questions or concerns prior to her return visit.   Follow up plan: Return in about 6 months (around 09/24/2021) for Thyroid follow up, Previsit labs, thyroid ultrasound.  Rayetta Pigg, [facility] [address] [place], [place] [address] Phone: [phone] Fax: [phone]   03/27/2021, 10:16 AM

## 2021-03-27 NOTE — Patient Instructions (Signed)

## 2021-04-15 ENCOUNTER — Other Ambulatory Visit: Payer: Self-pay | Admitting: Family Medicine

## 2021-04-26 ENCOUNTER — Other Ambulatory Visit: Payer: Self-pay

## 2021-04-26 ENCOUNTER — Encounter: Payer: Self-pay | Admitting: Family Medicine

## 2021-04-26 ENCOUNTER — Ambulatory Visit (INDEPENDENT_AMBULATORY_CARE_PROVIDER_SITE_OTHER): Payer: Medicare Other | Admitting: Family Medicine

## 2021-04-26 VITALS — BP 137/76 | HR 111 | Ht 65.0 in | Wt 244.1 lb

## 2021-04-26 DIAGNOSIS — I10 Essential (primary) hypertension: Secondary | ICD-10-CM | POA: Diagnosis not present

## 2021-04-26 DIAGNOSIS — H6120 Impacted cerumen, unspecified ear: Secondary | ICD-10-CM | POA: Insufficient documentation

## 2021-04-26 DIAGNOSIS — H6121 Impacted cerumen, right ear: Secondary | ICD-10-CM

## 2021-04-26 NOTE — Patient Instructions (Addendum)
F/u as before, call if you need me sooner ? ?Nurse please flush right ear, prior to discharge ? ?Thanks for choosing [facility], we consider it a privelige to serve you. ? ?

## 2021-04-29 ENCOUNTER — Encounter: Payer: Self-pay | Admitting: Family Medicine

## 2021-04-29 NOTE — Assessment & Plan Note (Signed)
?  Patient re-educated about  the importance of commitment to a  minimum of 150 minutes of exercise per week as able. ? ?The importance of healthy food choices with portion control discussed, as well as eating regularly and within a 12 hour window most days. ?The need to choose "clean , green" food 50 to 75% of the time is discussed, as well as to make water the primary drink and set a goal of 64 ounces water daily. ? ?  ?Weight /BMI 04/26/2021 03/27/2021 02/08/2021  ?WEIGHT 244 lb 1.9 oz 241 lb 6.4 oz 238 lb  ?HEIGHT '5\' 5"'$  '5\' 5"'$  '5\' 5"'$   ?BMI 40.62 kg/m2 40.17 kg/m2 39.61 kg/m2  ? ? ? ?

## 2021-04-29 NOTE — Assessment & Plan Note (Signed)
Ear flush by nursing, successful ?

## 2021-04-29 NOTE — Assessment & Plan Note (Signed)
Controlled, no change in medication ?DASH diet and commitment to daily physical activity for a minimum of 30 minutes discussed and encouraged, as a part of hypertension management. ?The importance of attaining a healthy weight is also discussed. ? ?BP/Weight 04/26/2021 03/27/2021 02/08/2021 02/01/2021 01/26/2021 01/25/2021 11/29/2020  ?Systolic BP 938 101 751 025 148 - 112  ?Diastolic BP 76 81 82 78 90 - 67  ?Wt. (Lbs) 244.12 241.4 238 240 - 237 237.8  ?BMI 40.62 40.17 39.61 39.94 - 39.44 39.57  ? ? ? ? ?

## 2021-04-29 NOTE — Progress Notes (Signed)
? ?  [name]     MRN: [id number]      DOB: 1955/09/26 ? ? ?HPI ?[name] is here with c/o right ear stopped up x 2 weeks ?Denies sinus pressure, excess drainage from ears or nose, no fever or chills  ? ?ROS ?Denies recent fever or chills. ?Denies sinus pressure, nasal congestion, or sore throat. ?Denies chest congestion, productive cough or wheezing. ?Denies chest pains, palpitations and leg swelling ?Denies abdominal pain, nausea, vomiting,diarrhea or constipation.   ? ?Denies skin break down or rash. ? ? ?PE ? ?BP 137/76   Pulse (!) 111   Ht '5\' 5"'$  (1.651 m)   Wt 244 lb 1.9 oz (110.7 kg)   SpO2 96%   BMI 40.62 kg/m?  ? ?Patient alert and oriented and in no cardiopulmonary distress. ? ?HEENT: No facial asymmetry, EOMI,     Neck supple .Right ear impacted with cerumen ? ?Chest: Clear to auscultation bilaterally. ? ?CVS: S1, S2 no murmurs, no S3.Regular rate. ? ?  ? ?Ext: No edema ? ?MS: Adequate ROM spine, shoulders, hips and knees. ? ? ? ?Assessment & Plan ? ?Cerumen impaction ?Ear flush by nursing, successful ? ?Essential hypertension ?Controlled, no change in medication ?DASH diet and commitment to daily physical activity for a minimum of 30 minutes discussed and encouraged, as a part of hypertension management. ?The importance of attaining a healthy weight is also discussed. ? ?BP/Weight 04/26/2021 03/27/2021 02/08/2021 02/01/2021 01/26/2021 01/25/2021 11/29/2020  ?Systolic BP 062 694 854 627 148 - 112  ?Diastolic BP 76 81 82 78 90 - 67  ?Wt. (Lbs) 244.12 241.4 238 240 - 237 237.8  ?BMI 40.62 40.17 39.61 39.94 - 39.44 39.57  ? ? ? ? ? ?Morbid obesity ([place]) ? ?Patient re-educated about  the importance of commitment to a  minimum of 150 minutes of exercise per week as able. ? ?The importance of healthy food choices with portion control discussed, as well as eating regularly and within a 12 hour window most days. ?The need to choose "clean , green" food 50 to 75% of the time is discussed, as well as to make  water the primary drink and set a goal of 64 ounces water daily. ? ?  ?Weight /BMI 04/26/2021 03/27/2021 02/08/2021  ?WEIGHT 244 lb 1.9 oz 241 lb 6.4 oz 238 lb  ?HEIGHT '5\' 5"'$  '5\' 5"'$  '5\' 5"'$   ?BMI 40.62 kg/m2 40.17 kg/m2 39.61 kg/m2  ? ? ? ? ?

## 2021-04-30 ENCOUNTER — Other Ambulatory Visit: Payer: Self-pay

## 2021-04-30 ENCOUNTER — Encounter: Payer: Self-pay | Admitting: Family Medicine

## 2021-04-30 ENCOUNTER — Ambulatory Visit (INDEPENDENT_AMBULATORY_CARE_PROVIDER_SITE_OTHER): Payer: Medicare Other | Admitting: Family Medicine

## 2021-04-30 VITALS — BP 130/78 | HR 90 | Ht 65.0 in | Wt 245.1 lb

## 2021-04-30 DIAGNOSIS — K219 Gastro-esophageal reflux disease without esophagitis: Secondary | ICD-10-CM

## 2021-04-30 DIAGNOSIS — E785 Hyperlipidemia, unspecified: Secondary | ICD-10-CM

## 2021-04-30 DIAGNOSIS — E890 Postprocedural hypothyroidism: Secondary | ICD-10-CM

## 2021-04-30 DIAGNOSIS — I10 Essential (primary) hypertension: Secondary | ICD-10-CM

## 2021-04-30 DIAGNOSIS — I4891 Unspecified atrial fibrillation: Secondary | ICD-10-CM

## 2021-04-30 DIAGNOSIS — R569 Unspecified convulsions: Secondary | ICD-10-CM

## 2021-04-30 NOTE — Progress Notes (Signed)
? ?[name]     MRN: [id number]      DOB: 02/20/1956 ? ? ?HPI ?[name] is here for follow up and re-evaluation of chronic medical conditions, medication management and review of any available recent lab and radiology data.  ?Preventive health is updated, specifically  Cancer screening and Immunization.   ?Questions or concerns regarding consultations or procedures which the PT has had in the interim are  addressed. ?The PT denies any adverse reactions to current medications since the last visit.  ?There are no new concerns.  ?There are no specific complaints  ? ?ROS ?Denies recent fever or chills. ?Denies sinus pressure, nasal congestion, ear pain or sore throat. ?Denies chest congestion, productive cough or wheezing. ?Denies chest pains, palpitations and leg swelling ?Denies abdominal pain, nausea, vomiting,diarrhea or constipation.   ?Denies dysuria, frequency, hesitancy or incontinence. ?Denies joint pain, swelling and limitation in mobility. ?Denies headaches, seizures, numbness, or tingling. ?Denies depression, anxiety or insomnia. ?Denies skin break down or rash. ? ? ?PE ? ?BP 130/78   Pulse 90   Ht '5\' 5"'$  (1.651 m)   Wt 245 lb 1.3 oz (111.2 kg)   SpO2 96%   BMI 40.78 kg/m?  ? ?Patient alert and oriented and in no cardiopulmonary distress. ? ?HEENT: No facial asymmetry, EOMI,     Neck supple . ? ?Chest: Clear to auscultation bilaterally. ? ?CVS: S1, S2 no murmurs, no S3.Regular rate. ? ?ABD: Soft non tender.  ? ?Ext: No edema ? ?MS: Adequate ROM spine, shoulders, hips and knees. ? ?Skin: Intact, no ulcerations or rash noted. ? ?Psych: Good eye contact, normal affect. Memory intact not anxious or depressed appearing. ? ?CNS: CN 2-12 intact, power,  normal throughout.no focal deficits noted. ? ? ?Assessment & Plan ? ?Hyperlipidemia LDL goal <100 ?Hyperlipidemia:Low fat diet discussed and encouraged. ? ? ?Lipid Panel  ?Lab Results  ?Component Value Date  ? CHOL 139 04/30/2021  ? HDL 50 04/30/2021  ?  [place] 74 04/30/2021  ? TRIG 77 04/30/2021  ? CHOLHDL 2.8 04/30/2021  ? ? ? ?Controlled, no change in medication ? ? ?Essential hypertension ?Controlled, no change in medication ?DASH diet and commitment to daily physical activity for a minimum of 30 minutes discussed and encouraged, as a part of hypertension management. ?The importance of attaining a healthy weight is also discussed. ? ?BP/Weight 04/30/2021 04/26/2021 03/27/2021 02/08/2021 02/01/2021 01/26/2021 01/25/2021  ?Systolic BP 124 580 998 338 131 148 -  ?Diastolic BP 78 76 81 82 78 90 -  ?Wt. (Lbs) 245.08 244.12 241.4 238 240 - 237  ?BMI 40.78 40.62 40.17 39.61 39.94 - 39.44  ? ? ? ? ? ?Morbid obesity ([place]) ? ?Patient re-educated about  the importance of commitment to a  minimum of 150 minutes of exercise per week as able. ? ?The importance of healthy food choices with portion control discussed, as well as eating regularly and within a 12 hour window most days. ?The need to choose "clean , green" food 50 to 75% of the time is discussed, as well as to make water the primary drink and set a goal of 64 ounces water daily. ? ?  ?Weight /BMI 04/30/2021 04/26/2021 03/27/2021  ?WEIGHT 245 lb 1.3 oz 244 lb 1.9 oz 241 lb 6.4 oz  ?HEIGHT '5\' 5"'$  '5\' 5"'$  '5\' 5"'$   ?BMI 40.78 kg/m2 40.62 kg/m2 40.17 kg/m2  ? ? ? ? ?Hypothyroidism, postsurgical ?manged by endo an controlled on current med ? ?GERD (gastroesophageal reflux disease) ?Controlled,  no change in medication ? ? ?Atrial fibrillation with RVR ([place]) ?Lifelong anticoag, eliquis ? ?Seizures ([place]) ?managed  By neurology , controlled on current med, denies any recent seizure activity ? ?

## 2021-04-30 NOTE — Patient Instructions (Signed)
F/U in 6 months, call if you need me sooner ? ?Lipid, cmp and eGFR today ? ? ?Nurse pls update shingrix vaccine status, if due and needs one , then she should go today for it , states she got it at [facility] about 3 months ago ? ?It is important that you exercise regularly at least 30 minutes 5 times a week. If you develop chest pain, have severe difficulty breathing, or feel very tired, stop exercising immediately and seek medical attention  ? ?Work on weight loss please as we discussed ? ?Thanks for choosing [facility], we consider it a privelige to serve you. ? ?

## 2021-05-01 LAB — BMP8+EGFR
BUN/Creatinine Ratio: 17 (ref 12–28)
BUN: 36 mg/dL — ABNORMAL HIGH (ref 8–27)
CO2: 20 mmol/L (ref 20–29)
Calcium: 9.3 mg/dL (ref 8.7–10.3)
Chloride: 105 mmol/L (ref 96–106)
Creatinine, Ser: 2.16 mg/dL — ABNORMAL HIGH (ref 0.57–1.00)
Glucose: 95 mg/dL (ref 70–99)
Potassium: 4.4 mmol/L (ref 3.5–5.2)
Sodium: 139 mmol/L (ref 134–144)
eGFR: 25 mL/min/{1.73_m2} — ABNORMAL LOW (ref >59)

## 2021-05-01 LAB — LIPID PANEL
Chol/HDL Ratio: 2.8 ratio (ref 0.0–4.4)
Cholesterol, Total: 139 mg/dL (ref 100–199)
HDL: 50 mg/dL (ref >39)
LDL Chol Calc (NIH): 74 mg/dL (ref 0–99)
Triglycerides: 77 mg/dL (ref 0–149)
VLDL Cholesterol Cal: 15 mg/dL (ref 5–40)

## 2021-05-05 ENCOUNTER — Encounter: Payer: Self-pay | Admitting: Family Medicine

## 2021-05-05 NOTE — Assessment & Plan Note (Signed)
Lifelong anticoag, eliquis ?

## 2021-05-05 NOTE — Assessment & Plan Note (Signed)
Controlled, no change in medication ?DASH diet and commitment to daily physical activity for a minimum of 30 minutes discussed and encouraged, as a part of hypertension management. ?The importance of attaining a healthy weight is also discussed. ? ?BP/Weight 04/30/2021 04/26/2021 03/27/2021 02/08/2021 02/01/2021 01/26/2021 01/25/2021  ?Systolic BP 278 004 471 580 131 148 -  ?Diastolic BP 78 76 81 82 78 90 -  ?Wt. (Lbs) 245.08 244.12 241.4 238 240 - 237  ?BMI 40.78 40.62 40.17 39.61 39.94 - 39.44  ? ? ? ? ?

## 2021-05-05 NOTE — Assessment & Plan Note (Signed)
Controlled, no change in medication  

## 2021-05-05 NOTE — Assessment & Plan Note (Signed)
Hyperlipidemia:Low fat diet discussed and encouraged. ? ? ?Lipid Panel  ?Lab Results  ?Component Value Date  ? CHOL 139 04/30/2021  ? HDL 50 04/30/2021  ? [place] 74 04/30/2021  ? TRIG 77 04/30/2021  ? CHOLHDL 2.8 04/30/2021  ? ? ? ?Controlled, no change in medication ? ?

## 2021-05-05 NOTE — Assessment & Plan Note (Signed)
?  Patient re-educated about  the importance of commitment to a  minimum of 150 minutes of exercise per week as able. ? ?The importance of healthy food choices with portion control discussed, as well as eating regularly and within a 12 hour window most days. ?The need to choose "clean , green" food 50 to 75% of the time is discussed, as well as to make water the primary drink and set a goal of 64 ounces water daily. ? ?  ?Weight /BMI 04/30/2021 04/26/2021 03/27/2021  ?WEIGHT 245 lb 1.3 oz 244 lb 1.9 oz 241 lb 6.4 oz  ?HEIGHT '5\' 5"'$  '5\' 5"'$  '5\' 5"'$   ?BMI 40.78 kg/m2 40.62 kg/m2 40.17 kg/m2  ? ? ? ?

## 2021-05-05 NOTE — Assessment & Plan Note (Signed)
manged by endo an controlled on current med ?

## 2021-05-05 NOTE — Assessment & Plan Note (Signed)
managed  By neurology , controlled on current med, denies any recent seizure activity ?

## 2021-05-11 ENCOUNTER — Other Ambulatory Visit: Payer: Self-pay | Admitting: Family Medicine

## 2021-05-13 ENCOUNTER — Other Ambulatory Visit: Payer: Self-pay | Admitting: Family Medicine

## 2021-05-14 IMAGING — CT CT ABD-PELV W/O CM
2 of 4 series · 15 of 46 positions shown, 17 images · non-contrast
Comparison: 05/02/2019

CLINICAL DATA: Nausea and vomiting, history of partial nephrectomy
on the right several days ago

EXAM:
CT ABDOMEN AND PELVIS WITHOUT CONTRAST
TECHNIQUE: Multidetector CT imaging of the abdomen and pelvis was performed
following the standard protocol without IV contrast.

[Series 2: axial st · axial · 0.85mm/px · z∈[-484,-34]mm · 12 of 102 slices shown, 14 images]
[im 6/102  soft-tissue]
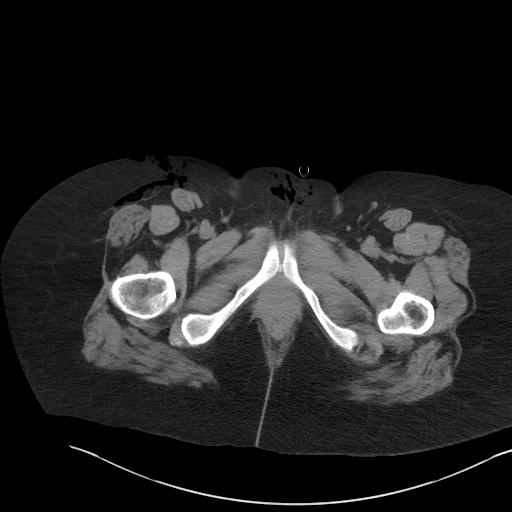
[im 6/102  bone]
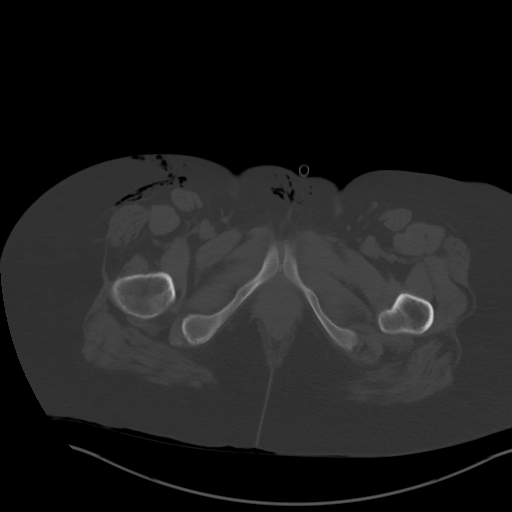
[im 16/102  soft-tissue]
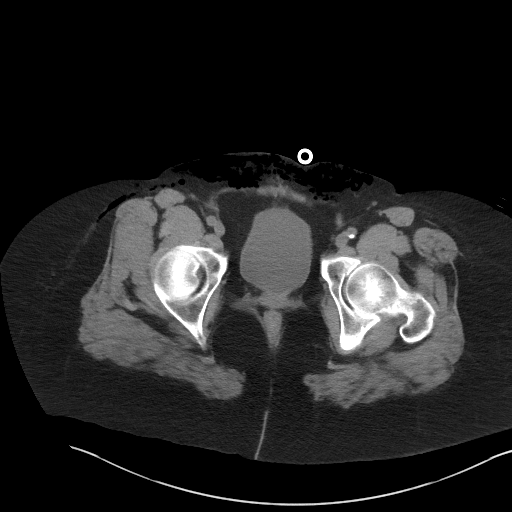
[im 22/102  soft-tissue]
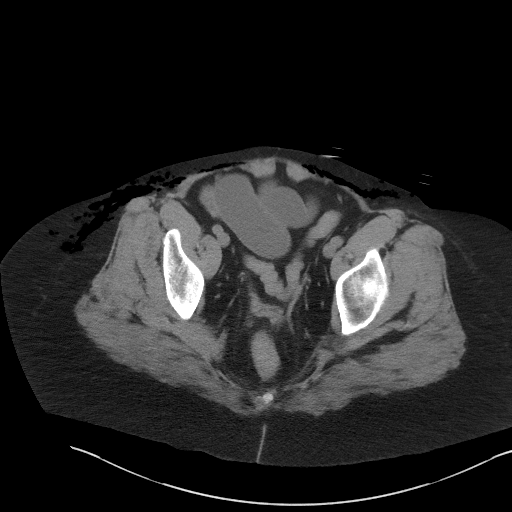
[im 32/102  soft-tissue]
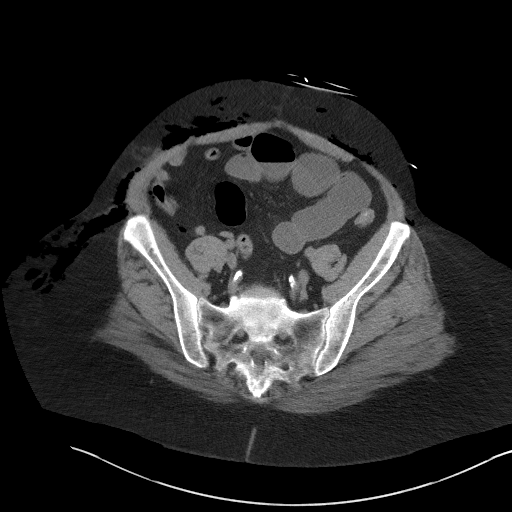
[im 38/102  soft-tissue]
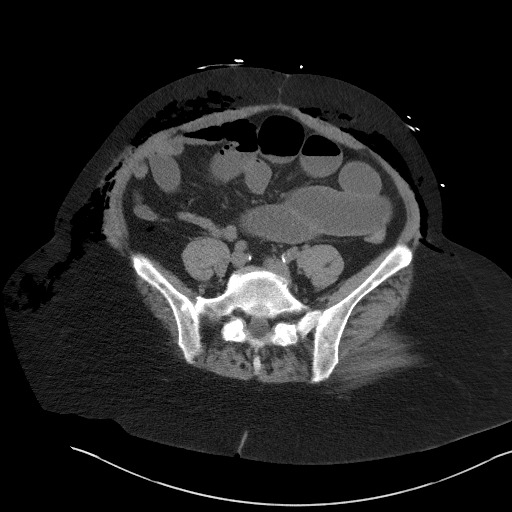
[im 48/102  soft-tissue]
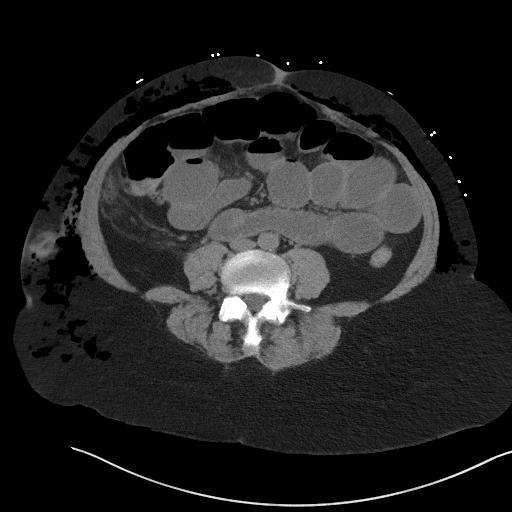
[im 54/102  soft-tissue]
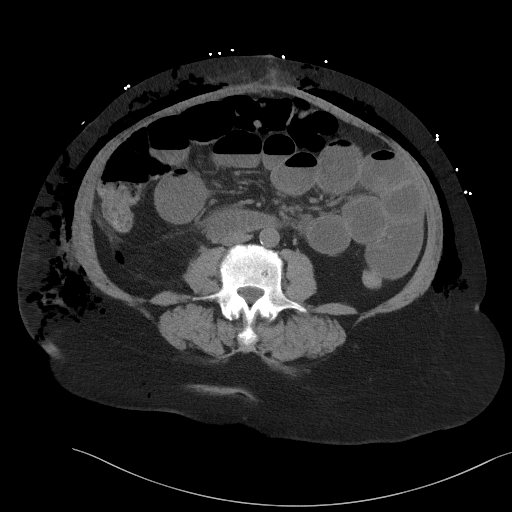
[im 64/102  soft-tissue]
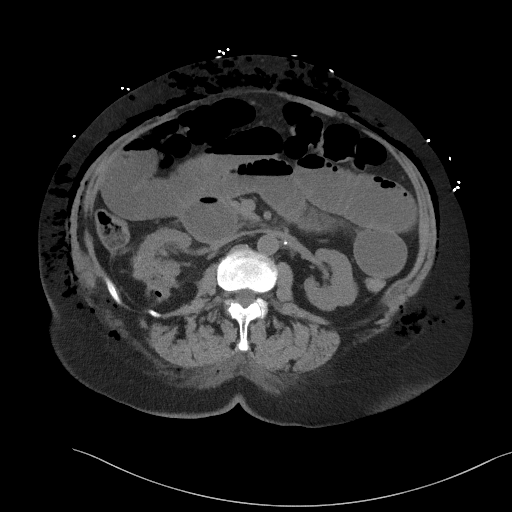
[im 70/102  soft-tissue]
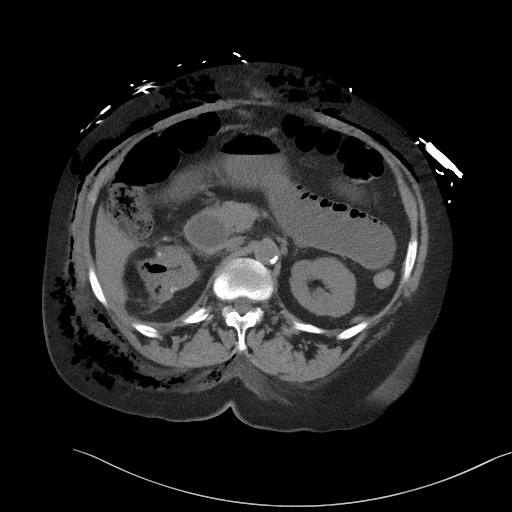
[im 70/102  bone]
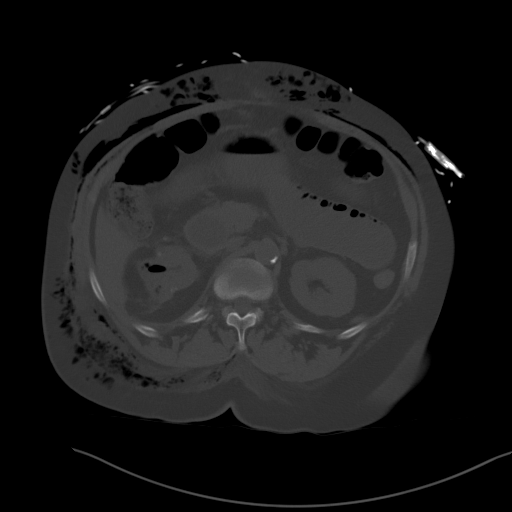
[im 80/102  soft-tissue]
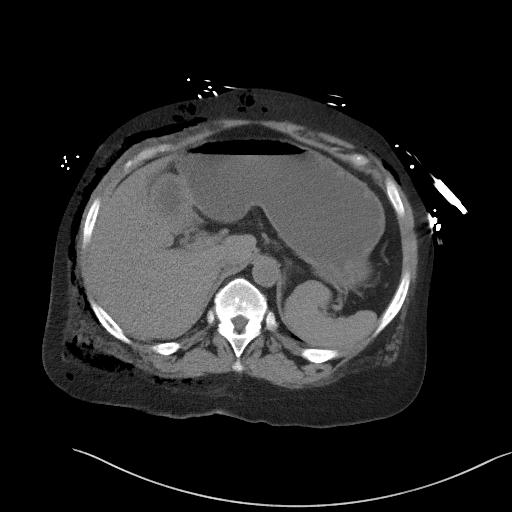
[im 86/102  soft-tissue]
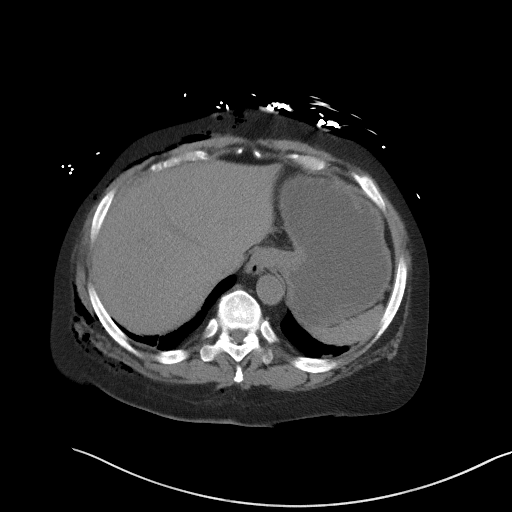
[im 96/102  soft-tissue]
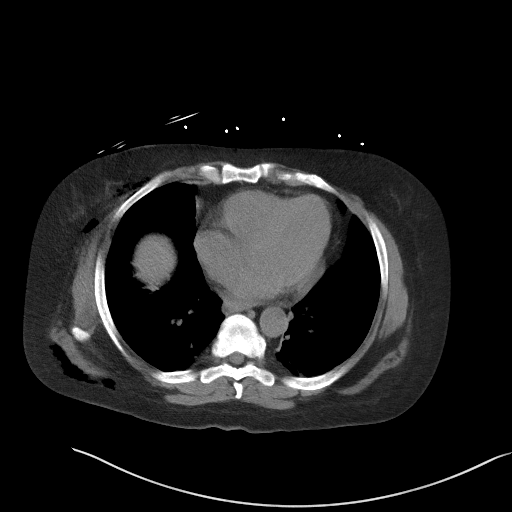

[Series 5: coronal st · coronal · 0.82mm/px · 3 of 119 slices shown]
[im 40/119  soft-tissue]
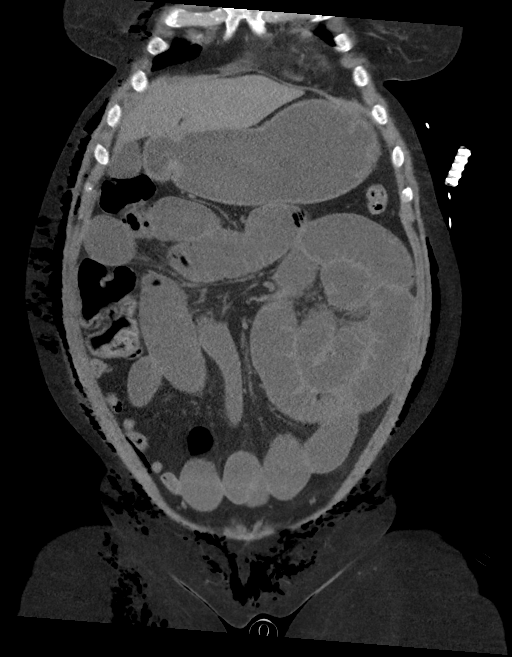
[im 53/119  soft-tissue]
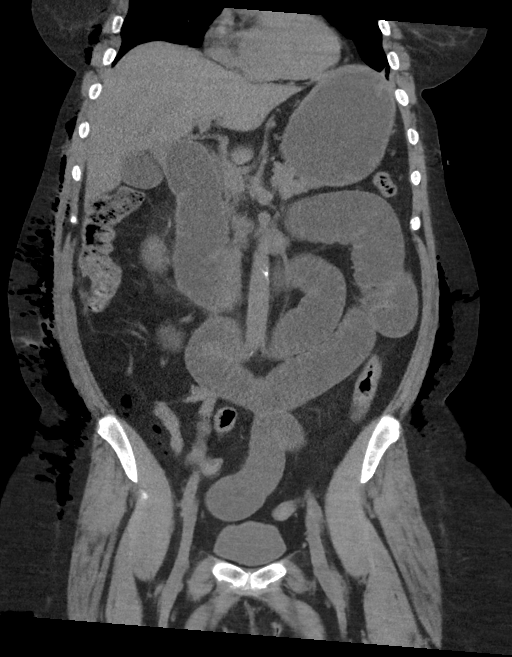
[im 66/119  soft-tissue]
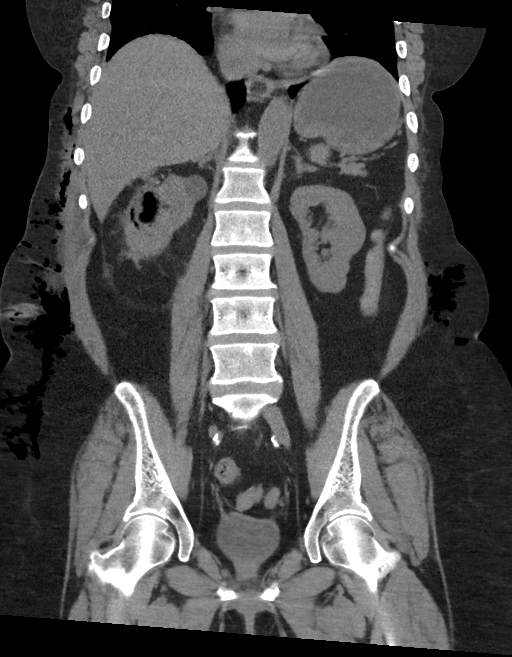

[15 of 46 positions shown; findings below may reference images not displayed]

FINDINGS: Lower chest: Lung bases demonstrate mild atelectatic changes.

Hepatobiliary: No focal liver abnormality is seen. No gallstones,
gallbladder wall thickening, or biliary dilatation.

Pancreas: Unremarkable. No pancreatic ductal dilatation or
surrounding inflammatory changes.

Spleen: Normal in size without focal abnormality.

Adrenals/Urinary Tract: Adrenal glands are within normal limits.
Left kidney shows small cysts similar to that seen on prior CT
examination. The right kidney demonstrates changes of prior partial
nephrectomy as well as cyst decortication. Air is noted in the
operative bed. No obstructive changes are seen. Bladder partially
distended.

Stomach/Bowel: Colon is decompressed. The appendix is within normal
limits. Multiple dilated loops of small bowel are identified filled
with fluid consistent with at least partial small bowel obstruction.
This involves the entirety of the jejunum in the majority of the
ileum. The distal ileum is within normal limits. The obstructive
changes secondary to partial herniation of a small bowel loop via
one of the laparoscopic ports best seen on image number 66 of series
2. More distal small bowel is within normal limits.

Vascular/Lymphatic: Aortic atherosclerosis. No enlarged abdominal or
pelvic lymph nodes.

Reproductive: Status post hysterectomy. No adnexal masses.

Other: Considerable subcutaneous air is identified within the
anterior abdominal wall related to the recent laparoscopic surgery.
No significant free air or free fluid is noted within the abdomen.

Musculoskeletal: No acute or significant osseous findings.
IMPRESSION: Changes consistent with partial small bowel obstruction secondary to
a herniated loop of small bowel in the right anterior abdominal wall
laterally through 1 of the laparoscopic ports.

Changes consistent with the recent partial nephrectomy on the right
and cyst decortication

## 2021-05-16 IMAGING — DX DG ABD PORTABLE 1V
2 series · 2 of 2 positions shown · non-contrast
Comparison: 08/12/2019

CLINICAL DATA: Abdominal distension

EXAM:
PORTABLE ABDOMEN - 1 VIEW

[abdomen kub (1 of 2)]
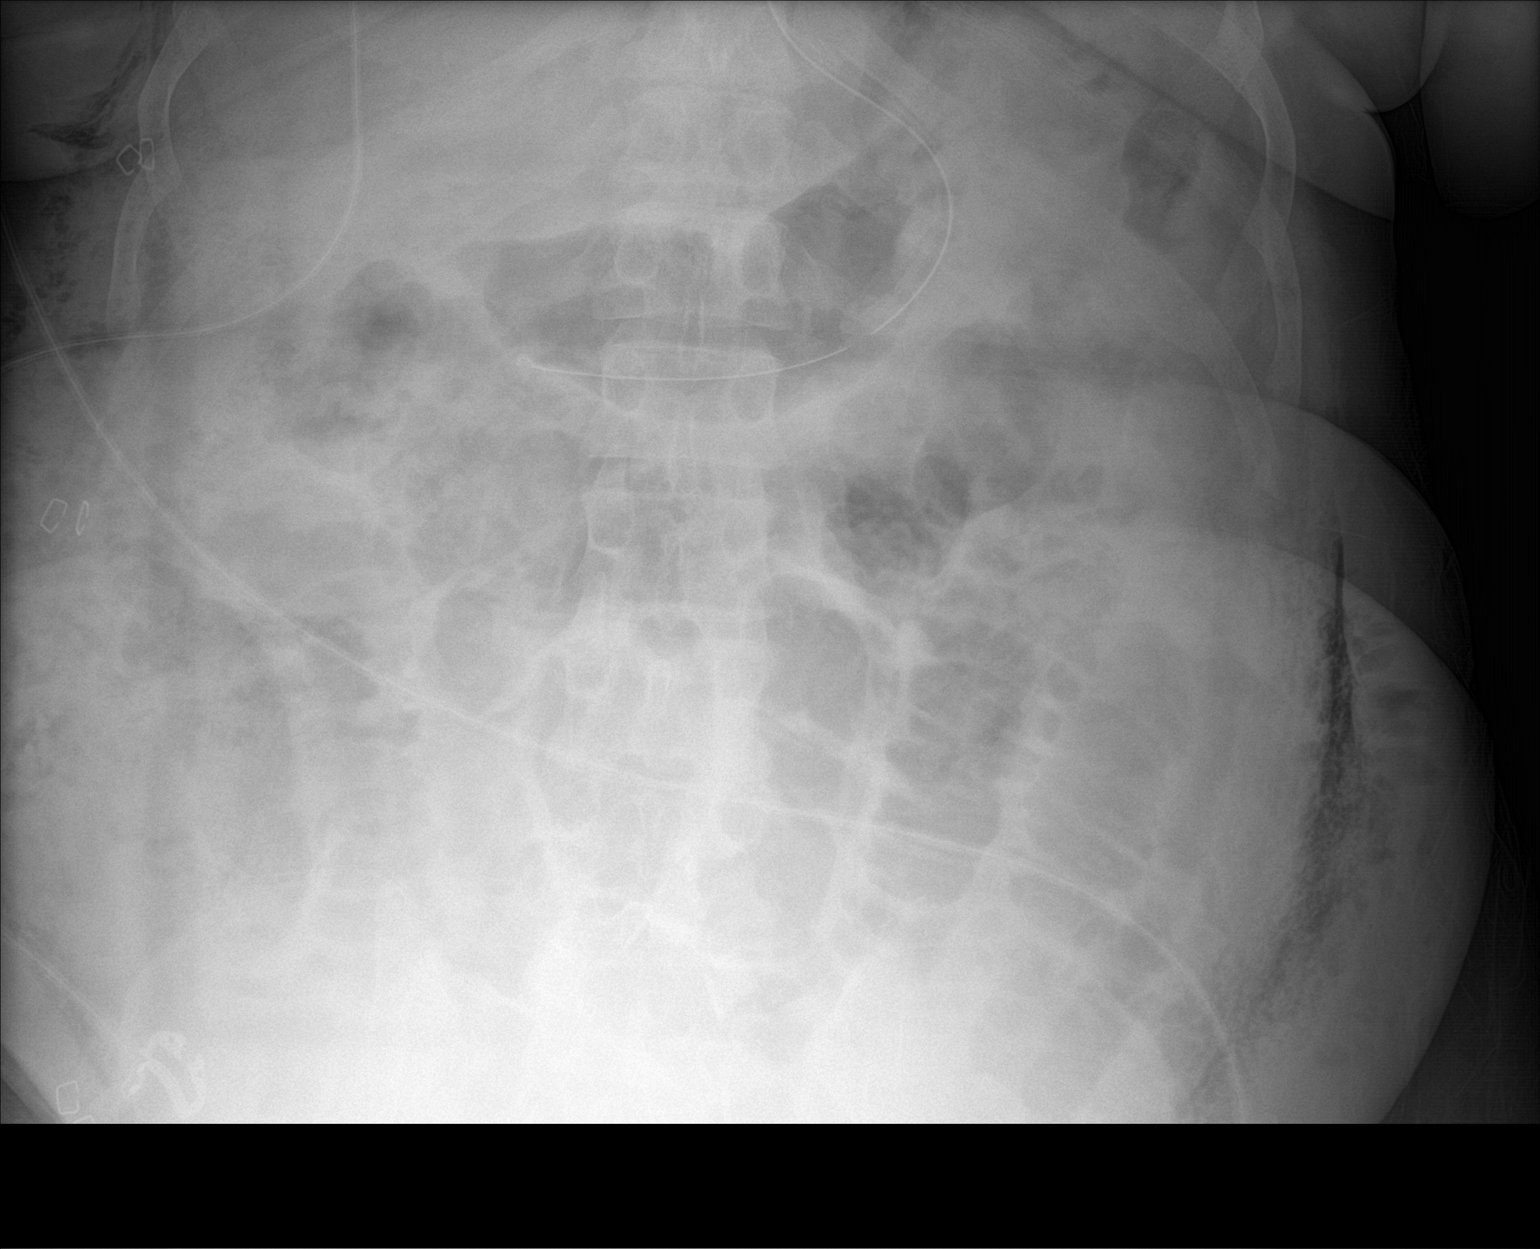

[abdomen kub (2 of 2)]
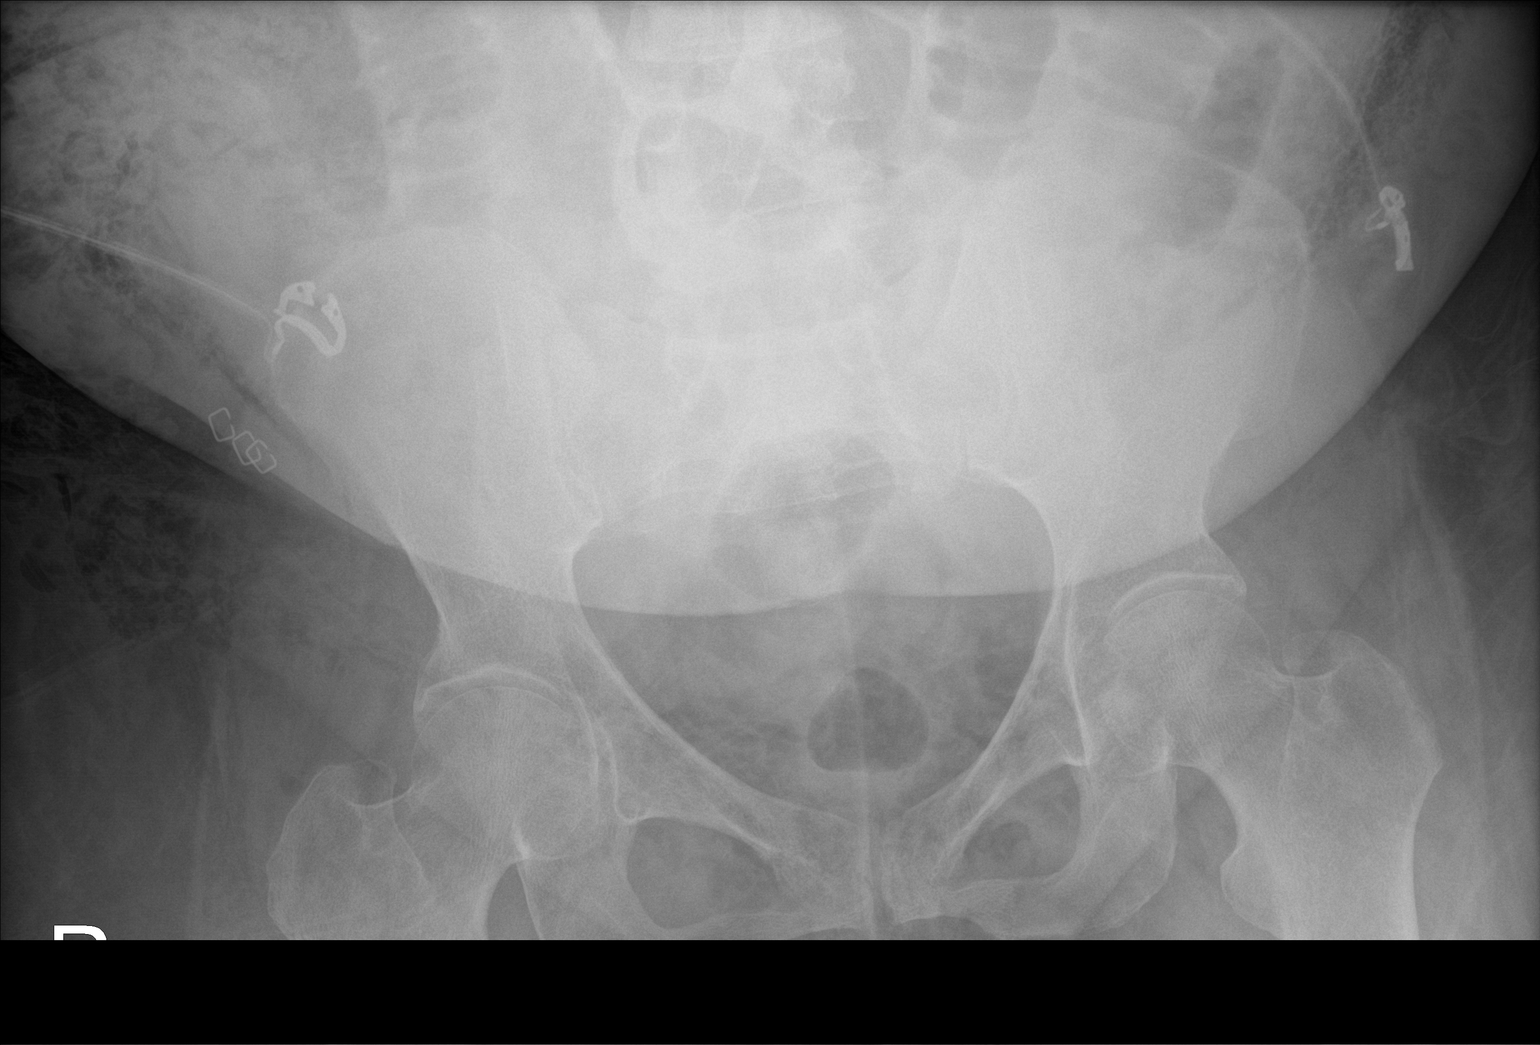

[2 of 2 positions shown; findings below may reference images not displayed]

FINDINGS: Enteric tube terminates within the gastric body. Moderately dilated
air-filled loops of small bowel are again seen throughout the
abdomen, similar in caliber to previous study. Diffuse subcutaneous
emphysema of the abdominal wall.
IMPRESSION: 1. Moderately dilated air-filled loops of small bowel throughout the
abdomen, similar in caliber to previous study.
2. Enteric tube terminates within the gastric body.

## 2021-05-23 IMAGING — US US RENAL
1 series · 13 of 25 positions shown · non-contrast
Comparison: CT, 08/12/2019

CLINICAL DATA: Acute renal insufficiency. Had a partial right
nephrectomy for a right renal mass on 08/05/2019.

EXAM:
RENAL / URINARY TRACT ULTRASOUND COMPLETE

[Series 1: us renal · 13 of 76 slices shown]
[im 1/76]
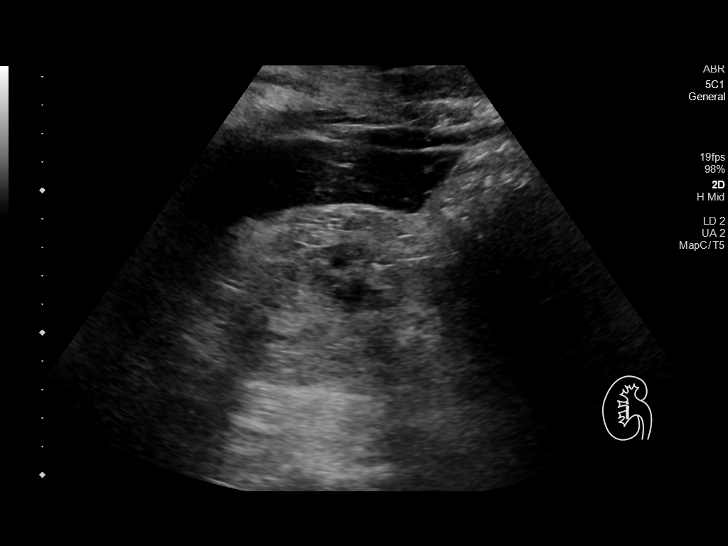
[im 7/76]
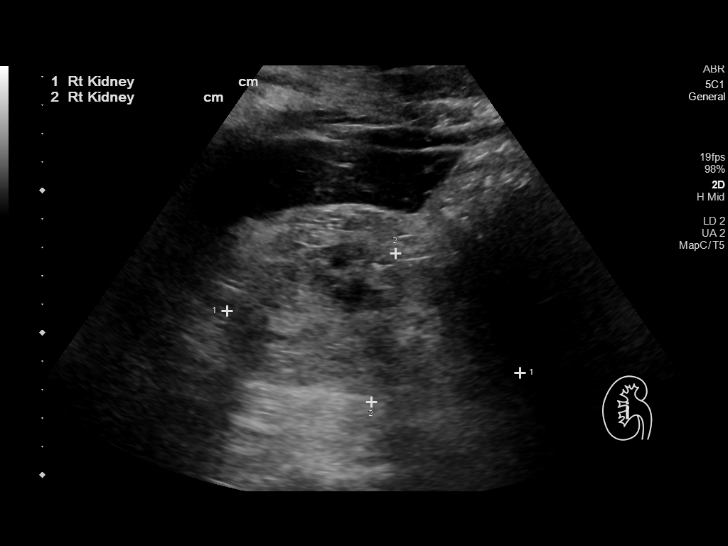
[im 13/76]
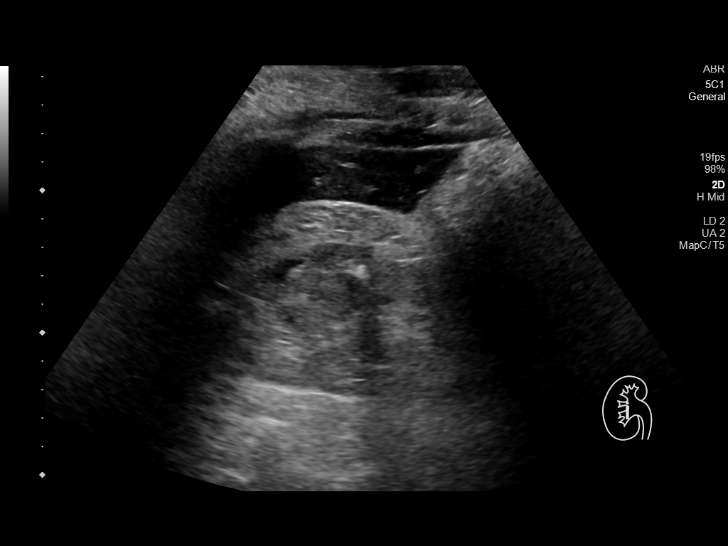
[im 19/76]
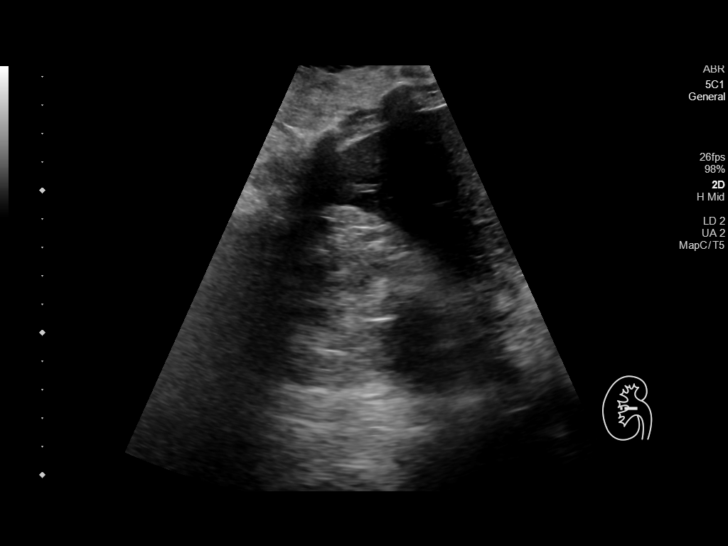
[im 26/76]
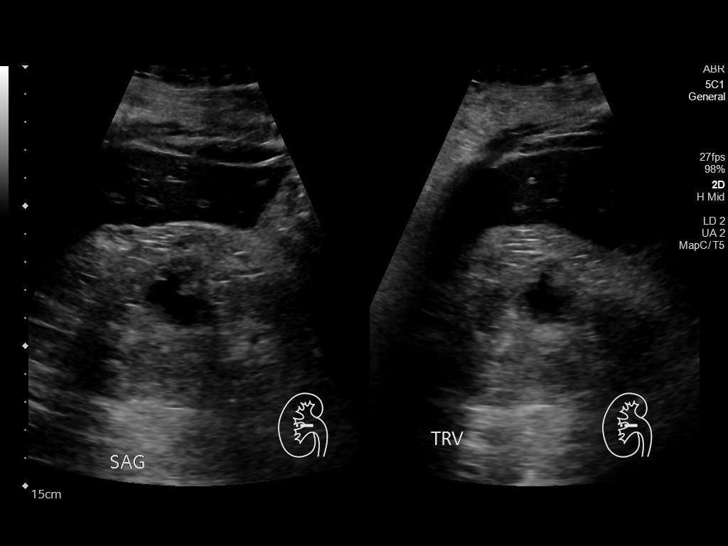
[im 32/76]
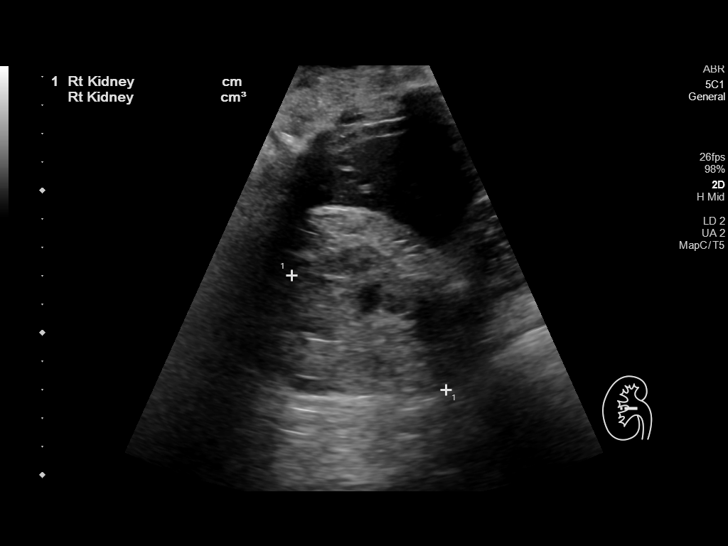
[im 38/76]
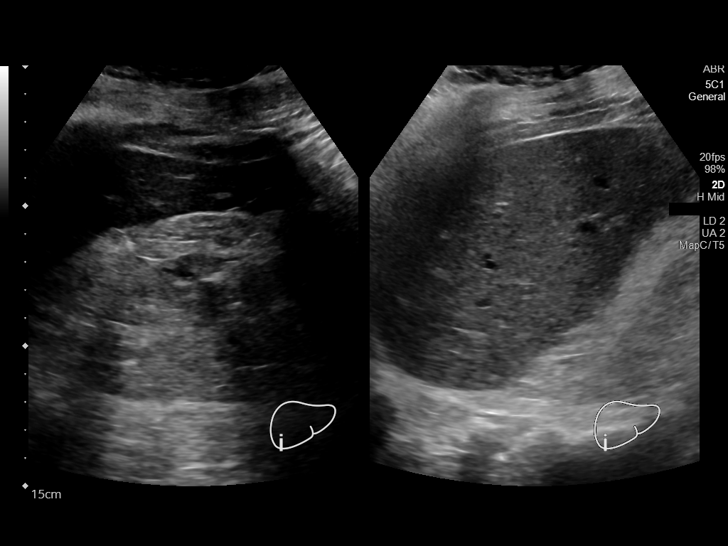
[im 44/76]
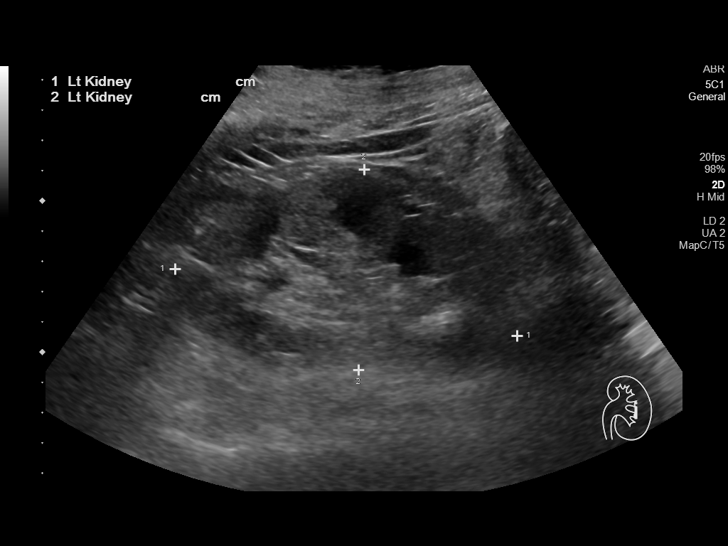
[im 51/76]
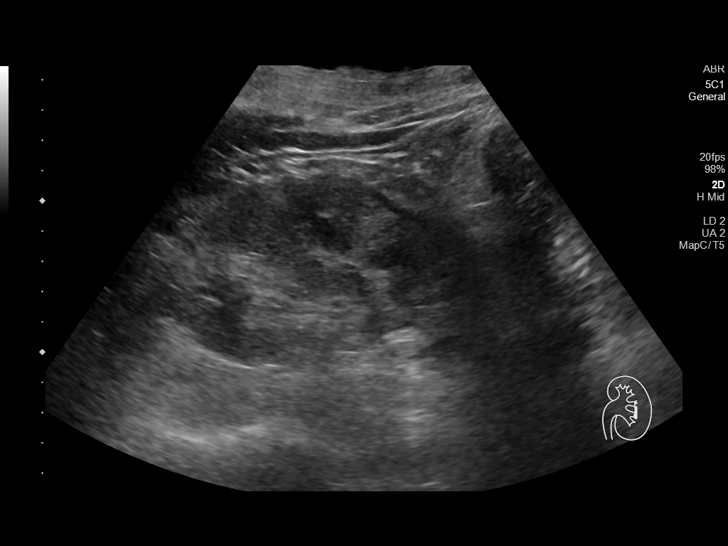
[im 57/76]
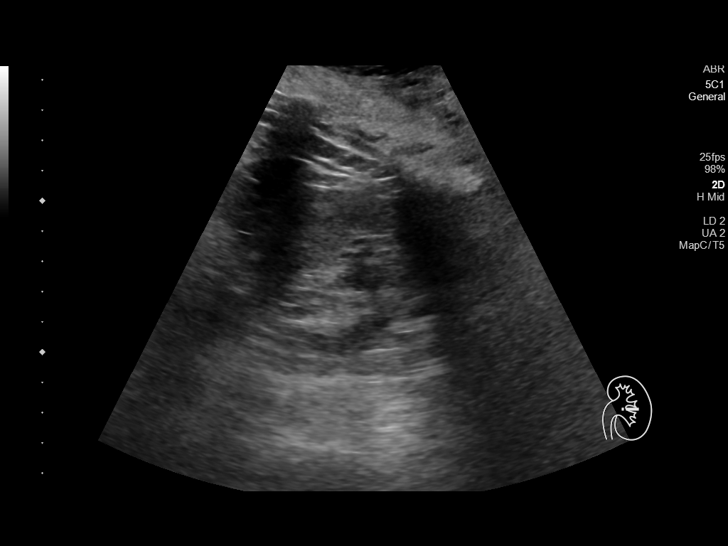
[im 63/76]
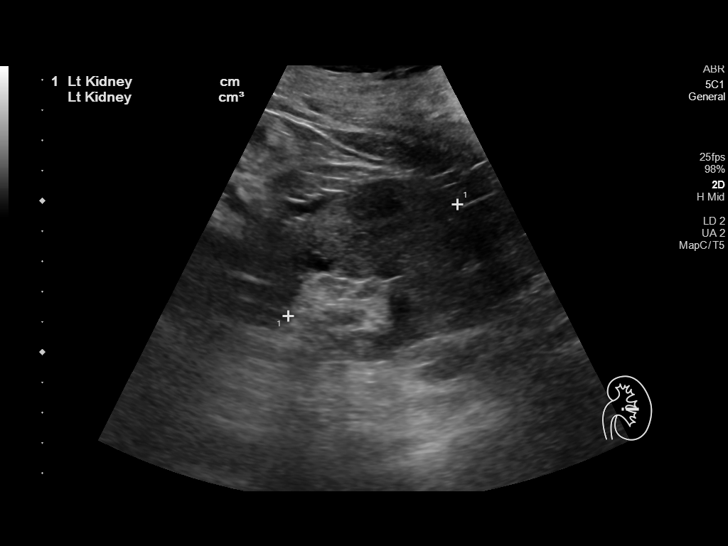
[im 69/76]
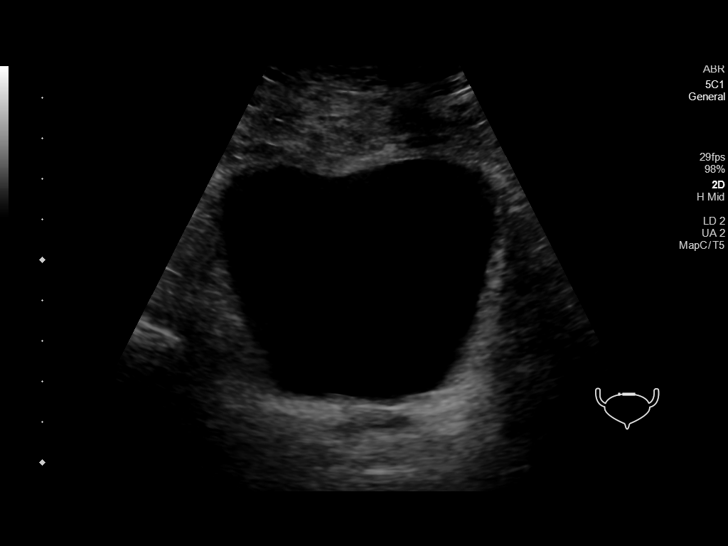
[im 76/76]
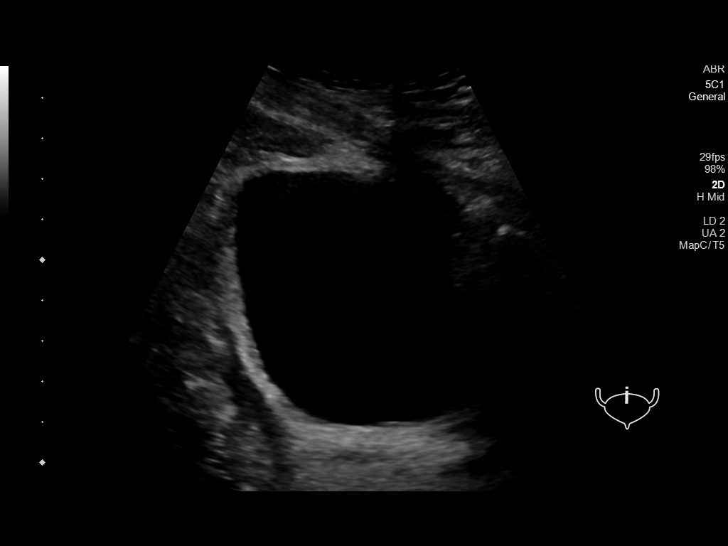

[13 of 25 positions shown; findings below may reference images not displayed]

FINDINGS: Right Kidney:

Renal measurements: 10.5 x 5.3 x 6.8 cm = volume: 196.7 mL.
Increased and heterogeneous parenchymal echogenicity. Irregular
cystic area noted in the midpole measuring 2.1 x 1.4 x 2.0 cm, which
is likely a postoperative collection. No other renal mass small or
lesion. 8 mm echogenic lesion projects in the mid pole which could
reflect a stone. Focus of collecting system air is also possible. No
hydronephrosis.

Left Kidney:

Renal measurements: 11.5 x 6.6 x 6.7 cm = volume: 267.9 mL.
Increased parenchymal echogenicity. No mass, stone or
hydronephrosis.

Bladder:

Appears normal for degree of bladder distention.

Other:

None.
IMPRESSION: 1. Bilateral increased renal parenchymal echogenicity consistent
with medical renal disease.
2. No hydronephrosis.
3. 2.1 cm irregular cystic lesion in the midpole the right kidney
that likely reflects postoperative fluid from the previous partial
nephrectomy. A small focus of increased echogenicity in the midpole
the right kidney is most likely due to air, which was evident within
the kidney on the prior CT. A stone is possible, but no stone was
noted in the right kidney on the prior CT.

## 2021-06-11 ENCOUNTER — Other Ambulatory Visit: Payer: Self-pay | Admitting: Family Medicine

## 2021-06-17 ENCOUNTER — Ambulatory Visit (INDEPENDENT_AMBULATORY_CARE_PROVIDER_SITE_OTHER): Payer: Medicare Other

## 2021-06-17 VITALS — BP 118/70 | HR 87 | Ht 65.0 in | Wt 242.0 lb

## 2021-06-17 DIAGNOSIS — Z0000 Encounter for general adult medical examination without abnormal findings: Secondary | ICD-10-CM | POA: Diagnosis not present

## 2021-06-17 NOTE — Patient Instructions (Signed)
?  [name] , ?Thank you for taking time to come for your Medicare Wellness Visit. I appreciate your ongoing commitment to your health goals. Please review the following plan we discussed and let me know if I can assist you in the future.  ? ?These are the goals we discussed: ? Goals   ? ?  Patient Stated   ?  Patient currently does not have any goals ?  ?  Weight (lb) < 200 lb (90.7 kg)   ? ?  ?  ?This is a list of the screening recommended for you and due dates:  ?Health Maintenance  ?Topic Date Due  ? COVID-19 Vaccine (3 - Mixed Product risk series) 05/18/2020  ? Zoster (Shingles) Vaccine (2 of 2) 01/30/2021  ? Flu Shot  09/24/2021  ? Mammogram  12/24/2021  ? Tetanus Vaccine  04/02/2026  ? Colon Cancer Screening  05/17/2026  ? Pneumonia Vaccine  Completed  ? DEXA scan (bone density measurement)  Completed  ? Hepatitis C Screening: USPSTF Recommendation to screen - Ages 32-79 yo.  Completed  ? HPV Vaccine  Aged Out  ?  ?

## 2021-06-17 NOTE — Progress Notes (Signed)
? ?Subjective:  ? [name] is a 66 y.o. female who presents for Medicare Annual (Subsequent) preventive examination. ? ?Review of Systems    ? ?  ? ?   ?Objective:  ?  ?There were no vitals filed for this visit. ?There is no height or weight on file to calculate BMI. ? ? ?  06/13/2020  ? 10:24 AM 08/13/2019  ? 12:00 AM 08/05/2019  ?  9:04 AM 07/29/2019  ?  2:29 PM 05/02/2019  ?  9:07 AM 04/03/2018  ?  5:27 PM 04/02/2018  ?  8:24 PM  ?Advanced Directives  ?Does Patient Have a Medical Advance Directive? No No No No No No No  ?Would patient like information on creating a medical advance directive? No - Patient declined No - Patient declined No - Patient declined No - Patient declined  No - Patient declined No - Patient declined  ? ? ?Current Medications (verified) ?Outpatient Encounter Medications as of 06/17/2021  ?Medication Sig  ? acetaminophen (TYLENOL) 500 MG tablet Take 1 tablet (500 mg total) by mouth 2 (two) times daily. (Patient taking differently: Take 500 mg by mouth 2 (two) times daily as needed for mild pain or headache.)  ? benazepril (LOTENSIN) 20 MG tablet TAKE (1) TABLET BY MOUTH ONCE DAILY.  ? cinacalcet (SENSIPAR) 30 MG tablet Take by mouth.  ? diltiazem (CARDIZEM CD) 180 MG 24 hr capsule TAKE 1 CAPSULE BY MOUTH ONCE DAILY.  ? ELIQUIS 5 MG TABS tablet TAKE 1 TABLET BY MOUTH TWICE DAILY.  ? ezetimibe (ZETIA) 10 MG tablet TAKE ONE TABLET BY MOUTH ONCE DAILY.  ? FLUoxetine (PROZAC) 10 MG capsule Take 1 capsule (10 mg total) by mouth daily.  ? KEPPRA 1000 MG tablet Take 1,000 mg by mouth 2 (two) times daily.  ? LAMICTAL 150 MG tablet TAKE 1 TABLET BY MOUTH ONCE DAILY.  ? levothyroxine (SYNTHROID) 125 MCG tablet Take 1 tablet (125 mcg total) by mouth daily.  ? montelukast (SINGULAIR) 10 MG tablet TAKE (1) TABLET BY MOUTH AT BEDTIME.  ? omeprazole (PRILOSEC) 20 MG capsule TAKE ONE CAPSULE BY MOUTH ONCE DAILY.  ? potassium chloride (KLOR-CON) 10 MEQ tablet Take 10 mEq by mouth daily.  ? rosuvastatin (CRESTOR)  40 MG tablet TAKE 1 TABLET BY MOUTH ONCE A DAY.  ? UNABLE TO FIND HHS ?Bath chair ?Bath mat  ? ?No facility-administered encounter medications on file as of 06/17/2021.  ? ? ?Allergies (verified) ?Simvastatin  ? ?History: ?Past Medical History:  ?Diagnosis Date  ? A-fib ([place])   ? Anemia   ? Depression   ? Dyspnea   ? Fractures   ? Hyperlipidemia   ? Hypertension   ? Mental retardation, mild (I.Q. 50-70)   ? lives alone and cares for self has a Marine scientist aide comes once a day   ? Obesity   ? Pneumonia   ? PONV (postoperative nausea and vomiting)   ? Seizure [facility])   ? 11/17/2012 "still having them; "I believe their from a tumor in my head" ((11/17/2012  ? Seizure disorder ([place])   ? Thyroid cancer ([place])   ? ?Past Surgical History:  ?Procedure Laterality Date  ? ABDOMINAL HYSTERECTOMY  2001  ? BREAST LUMPECTOMY  1996  ? right   ? COLONOSCOPY N/A 05/16/2016  ? Dr. Oneida Alar: Redundant colon, hemorrhoids, next colonoscopy 10 years  ? INGUINAL HERNIA REPAIR N/A 08/12/2019  ? Procedure: laparoscopic incisional hernia repair;  Surgeon: Lucas Mallow, MD;  Location: [facility] ORS;  Service: Urology;  Laterality: N/A;  ? ROBOTIC ASSITED PARTIAL NEPHRECTOMY Right 08/05/2019  ? Procedure: XI ROBOTIC ASSITED PARTIAL NEPHRECTOMY WITH INTRAOPERATIVE ULTRASOUND;  Surgeon: [name], MD;  Location: [facility];  Service: Urology;  Laterality: Right;  3 HRS  ? THYROIDECTOMY Bilateral 11/17/2012  ? Procedure: TOTAL THYROIDECTOMY;  Surgeon: Ascencion Dike, MD;  Location: [facility] OR;  Service: ENT;  Laterality: Bilateral;  ? TOTAL THYROIDECTOMY Bilateral 11/17/2012  ? TUBAL LIGATION    ? ?Family History  ?Problem Relation Age of Onset  ? Diabetes Mother   ? Colon cancer Neg Hx   ? ?Social History  ? ?Socioeconomic History  ? Marital status: Single  ?  Spouse name: Not on file  ? Number of children: 2  ? Years of education: Not on file  ? Highest education level: Not on file  ?Occupational History  ? Occupation: disabled   ?Tobacco Use  ? Smoking status: Never  ?  Smokeless tobacco: Never  ?Vaping Use  ? Vaping Use: Never used  ?Substance and Sexual Activity  ? Alcohol use: No  ? Drug use: No  ? Sexual activity: Never  ?Other Topics Concern  ? Not on file  ?Social History Narrative  ? Not on file  ? ?Social Determinants of Health  ? ?Financial Resource Strain: Not on file  ?Food Insecurity: Not on file  ?Transportation Needs: Not on file  ?Physical Activity: Not on file  ?Stress: Not on file  ?Social Connections: Not on file  ? ? ?Tobacco Counseling ?Counseling given: Not Answered ? ? ?Clinical Intake: ? ?  ? ?  ? ?  ? ?  ? ?  ? ?Diabetic?no ? ?  ? ?  ? ? ?Activities of Daily Living ?   ? View : No data to display.  ?  ?  ?  ? ? ?Patient Care Team: ?[name], MD as PCP - General ?Harl Bowie Alphonse Guild, MD as PCP - Cardiology (Cardiology) ?Eloise Harman, DO as Consulting Physician (Internal Medicine) ?Phillips Odor, MD as Consulting Physician (Neurology) ? ?Indicate any recent Medical Services you may have received from other than [facility] providers in the past year (date may be approximate). ? ?   ?Assessment:  ? This is a routine wellness examination for [place]. ? ?Hearing/Vision screen ?No results found. ? ?Dietary issues and exercise activities discussed: ?  ? ? Goals Addressed   ?None ?  ? ?Depression Screen ? ?  04/30/2021  ? 10:22 AM 04/26/2021  ?  9:34 AM 03/05/2021  ?  8:39 AM 02/01/2021  ?  1:39 PM 06/13/2020  ? 10:26 AM 12/22/2019  ?  4:26 PM 11/29/2019  ?  9:38 AM  ?PHQ 2/9 Scores  ?PHQ - 2 Score 0 0 0 0 0 0 0  ?PHQ- 9 Score      1 0  ?  ?Fall Risk ? ?  04/30/2021  ? 10:22 AM 04/26/2021  ?  9:33 AM 03/05/2021  ?  8:39 AM 02/01/2021  ?  1:39 PM 11/29/2020  ? 10:20 AM  ?Fall Risk   ?Falls in the past year? 0 0 0 0 0  ?Number falls in past yr: 0 0 0 0 0  ?Injury with Fall? 0 0 0 0 0  ?Risk for fall due to : No Fall Risks No Fall Risks No Fall Risks    ?Follow up Falls evaluation completed Falls evaluation completed Falls evaluation completed    ? ? ?FALL RISK PREVENTION  PERTAINING  TO THE HOME: ? ?Any stairs in or around the home? No  ?If so, are there any without handrails? No  ?Home free of loose throw rugs in walkways, pet beds, electrical cords, etc? Yes  ?Adequate lighting in your home to reduce risk of falls? Yes  ? ?ASSISTIVE DEVICES UTILIZED TO PREVENT FALLS: ? ?Life alert? Yes  ?Use of a cane, walker or w/c? No  ?Grab bars in the bathroom? Yes  ?Shower chair or bench in shower? Yes  ?Elevated toilet seat or a handicapped toilet? No  ? ?TIMED UP AND GO: ? ?Was the test performed? Yes .  ?Length of time to ambulate 10 feet: 60 sec.  ? ?Gait slow and steady without use of assistive device ? ?Cognitive Function: ?  ?  ?  ? ?Immunizations ?Immunization History  ?Administered Date(s) Administered  ? Fluad Quad(high Dose 65+) 11/29/2019, 11/29/2020  ? H1N1 12/16/2007  ? Influenza Split 11/24/2011  ? Influenza Whole 11/30/2006, 12/07/2008, 12/26/2009  ? Influenza,inj,Quad PF,6+ Mos 11/18/2012, 12/06/2013, 05/23/2015, 01/29/2016, 12/01/2016, 01/18/2018, 12/01/2018  ? Pneumococcal Conjugate-13 04/03/2014  ? Pneumococcal Polysaccharide-23 05/10/2019  ? Td 08/22/2003  ? Tdap 04/02/2016  ? Unspecified SARS-COV-2 Vaccination 05/30/2019, 04/20/2020  ? Zoster Recombinat (Shingrix) 12/05/2020  ? ? ?TDAP status: Up to date ? ?Flu Vaccine status: Up to date ? ?Pneumococcal vaccine status: Up to date ? ?Covid-19 vaccine status: Information provided on how to obtain vaccines.  ? ?Qualifies for Shingles Vaccine? Yes   ?Zostavax completed No   ?Shingrix Completed?: No.    Education has been provided regarding the importance of this vaccine. Patient has been advised to call insurance company to determine out of pocket expense if they have not yet received this vaccine. Advised may also receive vaccine at local pharmacy or Health Dept. Verbalized acceptance and understanding. ? ?Screening Tests ?Health Maintenance  ?Topic Date Due  ? COVID-19 Vaccine (3 - Mixed Product risk series) 05/18/2020  ?  Zoster Vaccines- Shingrix (2 of 2) 01/30/2021  ? INFLUENZA VACCINE  09/24/2021  ? MAMMOGRAM  12/24/2021  ? TETANUS/TDAP  04/02/2026  ? COLONOSCOPY (Pts 45-64yr Insurance coverage will need to be confirmed

## 2021-06-19 ENCOUNTER — Ambulatory Visit: Payer: Medicare Other

## 2021-06-21 ENCOUNTER — Telehealth: Payer: Self-pay | Admitting: Family Medicine

## 2021-06-21 NOTE — Telephone Encounter (Signed)
Called in on patient behalf  in regard to med aide. ? ?Patient is having troubles with meds. ? ? ?Daughter wants a call back in regard.  ?

## 2021-06-21 NOTE — Telephone Encounter (Signed)
Called and spoke to patient she states she's ok no issues with medication  ?

## 2021-07-15 ENCOUNTER — Other Ambulatory Visit: Payer: Self-pay | Admitting: Family Medicine

## 2021-08-12 ENCOUNTER — Emergency Department (HOSPITAL_COMMUNITY)
Admission: EM | Admit: 2021-08-12 | Discharge: 2021-08-12 | Discharge status: Against Medical Advice | Payer: Medicare Other | Attending: Emergency Medicine | Admitting: Emergency Medicine

## 2021-08-12 ENCOUNTER — Encounter (HOSPITAL_COMMUNITY): Payer: Self-pay | Admitting: *Deleted

## 2021-08-12 ENCOUNTER — Other Ambulatory Visit: Payer: Self-pay | Admitting: Family Medicine

## 2021-08-12 ENCOUNTER — Other Ambulatory Visit: Payer: Self-pay

## 2021-08-12 DIAGNOSIS — Z7901 Long term (current) use of anticoagulants: Secondary | ICD-10-CM | POA: Diagnosis not present

## 2021-08-12 DIAGNOSIS — Z79899 Other long term (current) drug therapy: Secondary | ICD-10-CM | POA: Insufficient documentation

## 2021-08-12 DIAGNOSIS — N189 Chronic kidney disease, unspecified: Secondary | ICD-10-CM | POA: Diagnosis not present

## 2021-08-12 DIAGNOSIS — E039 Hypothyroidism, unspecified: Secondary | ICD-10-CM | POA: Diagnosis not present

## 2021-08-12 DIAGNOSIS — G40909 Epilepsy, unspecified, not intractable, without status epilepticus: Secondary | ICD-10-CM | POA: Insufficient documentation

## 2021-08-12 DIAGNOSIS — I129 Hypertensive chronic kidney disease with stage 1 through stage 4 chronic kidney disease, or unspecified chronic kidney disease: Secondary | ICD-10-CM | POA: Insufficient documentation

## 2021-08-12 DIAGNOSIS — R569 Unspecified convulsions: Secondary | ICD-10-CM

## 2021-08-12 DIAGNOSIS — Z5329 Procedure and treatment not carried out because of patient's decision for other reasons: Secondary | ICD-10-CM | POA: Diagnosis not present

## 2021-08-12 LAB — COMPREHENSIVE METABOLIC PANEL
ALT: 18 U/L (ref 0–44)
AST: 23 U/L (ref 15–41)
Albumin: 4 g/dL (ref 3.5–5.0)
Alkaline Phosphatase: 151 U/L — ABNORMAL HIGH (ref 38–126)
Anion gap: 7 (ref 5–15)
BUN: 38 mg/dL — ABNORMAL HIGH (ref 8–23)
CO2: 20 mmol/L — ABNORMAL LOW (ref 22–32)
Calcium: 9.3 mg/dL (ref 8.9–10.3)
Chloride: 115 mmol/L — ABNORMAL HIGH (ref 98–111)
Creatinine, Ser: 1.93 mg/dL — ABNORMAL HIGH (ref 0.44–1.00)
GFR, Estimated: 28 mL/min — ABNORMAL LOW (ref >60)
Glucose, Bld: 110 mg/dL — ABNORMAL HIGH (ref 70–99)
Potassium: 4.6 mmol/L (ref 3.5–5.1)
Sodium: 142 mmol/L (ref 135–145)
Total Bilirubin: 0.6 mg/dL (ref 0.3–1.2)
Total Protein: 8.1 g/dL (ref 6.5–8.1)

## 2021-08-12 LAB — CBC WITH DIFFERENTIAL/PLATELET
Abs Immature Granulocytes: 0.01 10*3/uL (ref 0.00–0.07)
Basophils Absolute: 0.1 10*3/uL (ref 0.0–0.1)
Basophils Relative: 1 %
Eosinophils Absolute: 0 10*3/uL (ref 0.0–0.5)
Eosinophils Relative: 0 %
HCT: 39.9 % (ref 36.0–46.0)
Hemoglobin: 12.2 g/dL (ref 12.0–15.0)
Immature Granulocytes: 0 %
Lymphocytes Relative: 13 %
Lymphs Abs: 0.9 10*3/uL (ref 0.7–4.0)
MCH: 27.7 pg (ref 26.0–34.0)
MCHC: 30.6 g/dL (ref 30.0–36.0)
MCV: 90.5 fL (ref 80.0–100.0)
Monocytes Absolute: 0.5 10*3/uL (ref 0.1–1.0)
Monocytes Relative: 8 %
Neutro Abs: 5.2 10*3/uL (ref 1.7–7.7)
Neutrophils Relative %: 78 %
Platelets: 167 10*3/uL (ref 150–400)
RBC: 4.41 MIL/uL (ref 3.87–5.11)
RDW: 14.2 % (ref 11.5–15.5)
WBC: 6.7 10*3/uL (ref 4.0–10.5)
nRBC: 0 % (ref 0.0–0.2)

## 2021-08-12 LAB — CBG MONITORING, ED: Glucose-Capillary: 130 mg/dL — ABNORMAL HIGH (ref 70–99)

## 2021-08-12 NOTE — ED Triage Notes (Signed)
Reported that pt with seizures x 2 this morning last 38mn and 2 min long. Pt with hx of seizures lifelong. Pt states she has been taking her seizure meds as prescribed. Denies any pain.  Pt alert and oriented to age, year and month.

## 2021-08-12 NOTE — ED Notes (Signed)
Pt not in waiting area x 2 

## 2021-08-12 NOTE — ED Provider Notes (Signed)
[facility] Provider Note   CSN: [id number] Arrival date & time: 08/12/21  1046     History {Add pertinent medical, surgical, social history, OB history to HPI:1} Chief Complaint  Patient presents with   Seizures    [name] is a 65 y.o. female with a Hx of seizure disorders presents today with 2 episodes of seizures earlier this morning.  First lasted 6 minutes, second lasted 2 minutes.  Reports she has been taking her seizure medications as prescribed.  Takes Keppra and Lamictal.  Last seizure that brought her to the ED was 01/25/2021.  On Eliquis.  I signed up for the patient to provide care.  Before I was able to initiate care for the patient, he LWBS after triage.  The history is provided by the patient and medical records.  Seizures      Home Medications Prior to Admission medications   Medication Sig Start Date End Date Taking? Authorizing Provider  acetaminophen (TYLENOL) 500 MG tablet Take 1 tablet (500 mg total) by mouth 2 (two) times daily. Patient taking differently: Take 500 mg by mouth 2 (two) times daily as needed for mild pain or headache. 08/15/14   [name], MD  benazepril (LOTENSIN) 20 MG tablet TAKE (1) TABLET BY MOUTH ONCE DAILY. 06/11/21   [name], MD  cinacalcet (SENSIPAR) 30 MG tablet Take by mouth. 02/01/21 02/01/22  [provider]  diltiazem (CARDIZEM CD) 180 MG 24 hr capsule TAKE 1 CAPSULE BY MOUTH ONCE DAILY. 08/12/21   [name], MD  ELIQUIS 5 MG TABS tablet TAKE 1 TABLET BY MOUTH TWICE DAILY. 08/12/21   [name], MD  ezetimibe (ZETIA) 10 MG tablet TAKE ONE TABLET BY MOUTH ONCE DAILY. 08/12/21   [name], MD  FLUoxetine (PROZAC) 10 MG capsule Take 1 capsule (10 mg total) by mouth daily. 12/02/20   [name], MD  KEPPRA 1000 MG tablet Take 1,000 mg by mouth 2 (two) times daily. 11/14/20   [provider]  LAMICTAL 150 MG tablet TAKE 1 TABLET BY MOUTH  ONCE DAILY. 03/07/20   Perlie Mayo, NP  levothyroxine (SYNTHROID) 125 MCG tablet Take 1 tablet (125 mcg total) by mouth daily. 03/27/21   Brita Romp, NP  montelukast (SINGULAIR) 10 MG tablet TAKE (1) TABLET BY MOUTH AT BEDTIME. 08/12/21   [name], MD  omeprazole (PRILOSEC) 20 MG capsule TAKE ONE CAPSULE BY MOUTH ONCE DAILY. 08/12/21   [name], MD  potassium chloride (KLOR-CON) 10 MEQ tablet Take 10 mEq by mouth daily. 03/18/21   [provider]  rosuvastatin (CRESTOR) 40 MG tablet TAKE 1 TABLET BY MOUTH ONCE A DAY. 08/12/21   [name], MD  UNA[facility] TO FIND [facility] Bath chair Bath mat 06/20/20   [name], MD      Allergies    Simvastatin    Review of Systems   Review of Systems  Neurological:  Positive for seizures.    Physical Exam Updated Vital Signs BP 125/67 (BP Location: Right Arm)   Pulse 91   Temp 98.2 F (36.8 C) (Oral)   Resp 18   Ht '5\' 5"'$  (1.651 m)   Wt 108.9 kg   SpO2 97%   BMI 39.94 kg/m  Physical Exam  ED Results / Procedures / Treatments   Labs (all labs ordered are listed, but only abnormal results are displayed) Labs Reviewed  COMPREHENSIVE METABOLIC PANEL - Abnormal; Notable  for the following components:      Result Value   Chloride 115 (*)    CO2 20 (*)    Glucose, Bld 110 (*)    BUN 38 (*)    Creatinine, Ser 1.93 (*)    Alkaline Phosphatase 151 (*)    GFR, Estimated 28 (*)    All other components within normal limits  CBG MONITORING, ED - Abnormal; Notable for the following components:   Glucose-Capillary 130 (*)    All other components within normal limits  CBC WITH DIFFERENTIAL/PLATELET    EKG None  Radiology No results found.  Procedures Procedures  {Document cardiac monitor, telemetry assessment procedure when appropriate:1}  Medications Ordered in ED Medications - No data to display  ED Course/ Medical Decision Making/ A&P                           Medical Decision  Making Amount and/or Complexity of Data Reviewed Labs: ordered.   66 y.o. female presents to the ED for concern of Seizures     This involves an extensive number of treatment options, and is a complaint that carries with it a high risk of complications and morbidity.  The emergent differential diagnosis prior to evaluation includes, but is not limited to: ***  This is not an exhaustive differential.   Past Medical History / Co-morbidities / Social History: *** Social Determinants of Health include ***  Additional History:  Internal and external records from outside source obtained and reviewed including ***  Physical Exam: Physical exam performed. The pertinent findings include: ***  Lab Tests: I ordered, and personally interpreted labs.  The pertinent results include:   CBC: CMP/BMP: CBG: 130  Imaging Studies: I ordered imaging studies including *** .  I independently visualized and interpreted said imaging.  Pertinent results include:  I agree with the radiologist interpretation.  Cardiac Monitoring: The patient was maintained on a cardiac monitor.  I personally viewed and interpreted the cardiac monitored which showed an underlying rhythm of: ***  I personally ordered and interpreted EKG 12 lead findings, which showed: ***  Critical Interventions: Include ***  ED Course: Pt well-appearing on exam.  ***  Upon reevaluation, ***  I consulted with *** of ***, and discussed lab and imaging findings as well as pertinent plan - they recommend: ***   Disposition: After consideration of the diagnostic results and the patient's encounter today, I feel that the patient would benefit from ***.  Discussed course of treatment thoroughly with the patient, whom demonstrated understanding.  Patient in agreement and has no further questions.  I have reviewed the patients home medicines and have made adjustments as needed.  I discussed this case with my attending, Dr. Doren Custard, who  agreed with the proposed treatment course and cosigned this note including patient's presenting symptoms, physical exam, and planned diagnostics and interventions.  Attending physician stated agreement with plan or made changes to plan which were implemented.     This chart was dictated using voice recognition software.  Despite best efforts to proofread, errors can occur which can change the documentation meaning.   {Document critical care time when appropriate:1} {Document review of labs and clinical decision tools ie heart score, Chads2Vasc2 etc:1}  {Document your independent review of radiology images, and any outside records:1} {Document your discussion with family members, caretakers, and with consultants:1} {Document social determinants of health affecting pt's care:1} {Document your decision making why or why not admission,  treatments were needed:1} Final Clinical Impression(s) / ED Diagnoses Final diagnoses:  None    Rx / DC Orders ED Discharge Orders     None

## 2021-08-12 NOTE — ED Notes (Signed)
Pt not in waiting area x 1

## 2021-08-27 ENCOUNTER — Other Ambulatory Visit: Payer: Self-pay

## 2021-08-27 ENCOUNTER — Emergency Department (HOSPITAL_COMMUNITY)
Admission: EM | Admit: 2021-08-27 | Discharge: 2021-08-27 | Disposition: A | Discharge status: Home / Self Care | Payer: Medicare Other | Attending: Emergency Medicine | Admitting: Emergency Medicine

## 2021-08-27 ENCOUNTER — Encounter (HOSPITAL_COMMUNITY): Payer: Self-pay

## 2021-08-27 DIAGNOSIS — R531 Weakness: Secondary | ICD-10-CM | POA: Insufficient documentation

## 2021-08-27 DIAGNOSIS — F419 Anxiety disorder, unspecified: Secondary | ICD-10-CM | POA: Diagnosis not present

## 2021-08-27 DIAGNOSIS — N189 Chronic kidney disease, unspecified: Secondary | ICD-10-CM | POA: Diagnosis not present

## 2021-08-27 DIAGNOSIS — R208 Other disturbances of skin sensation: Secondary | ICD-10-CM

## 2021-08-27 DIAGNOSIS — N184 Chronic kidney disease, stage 4 (severe): Secondary | ICD-10-CM

## 2021-08-27 LAB — BASIC METABOLIC PANEL
Anion gap: 10 (ref 5–15)
BUN: 33 mg/dL — ABNORMAL HIGH (ref 8–23)
CO2: 21 mmol/L — ABNORMAL LOW (ref 22–32)
Calcium: 8.2 mg/dL — ABNORMAL LOW (ref 8.9–10.3)
Chloride: 111 mmol/L (ref 98–111)
Creatinine, Ser: 2.15 mg/dL — ABNORMAL HIGH (ref 0.44–1.00)
GFR, Estimated: 25 mL/min — ABNORMAL LOW (ref >60)
Glucose, Bld: 103 mg/dL — ABNORMAL HIGH (ref 70–99)
Potassium: 4.3 mmol/L (ref 3.5–5.1)
Sodium: 142 mmol/L (ref 135–145)

## 2021-08-27 LAB — CBC
HCT: 35.5 % — ABNORMAL LOW (ref 36.0–46.0)
Hemoglobin: 11 g/dL — ABNORMAL LOW (ref 12.0–15.0)
MCH: 27.6 pg (ref 26.0–34.0)
MCHC: 31 g/dL (ref 30.0–36.0)
MCV: 89 fL (ref 80.0–100.0)
Platelets: 178 10*3/uL (ref 150–400)
RBC: 3.99 MIL/uL (ref 3.87–5.11)
RDW: 13.8 % (ref 11.5–15.5)
WBC: 5 10*3/uL (ref 4.0–10.5)
nRBC: 0 % (ref 0.0–0.2)

## 2021-08-27 MED ORDER — LORAZEPAM 0.5 MG PO TABS
0.5000 mg | ORAL_TABLET | Freq: Once | ORAL | Status: AC
Start: 2021-08-27 — End: 2021-08-27
  Administered 2021-08-27: 0.5 mg via ORAL
  Filled 2021-08-27: qty 1

## 2021-08-27 NOTE — ED Notes (Signed)
Patient ambulated in hallway without difficulty. Called daughter whom states that she is on way to ED pick up patient. Patient ate entire meal tray. States that she wishes to wait in lobby for daughter. Ambulated independently to lobby with this Probation officer.

## 2021-08-27 NOTE — Discharge Instructions (Addendum)
It was our pleasure to provide your ER care today - we hope that you feel better.  Drink plenty of fluids/stat well hydrated.   It is possible your recent symptoms may be related to recent change in your meds - follow up  closely with your doctor in the coming week and discuss your meds, and follow up for recent symptoms.  Return to ER if worse, new symptoms, fevers, new/severe pain, chest pain, trouble breathing, or other concern.   You were given medication in the ER - no driving for the next  6 hours.

## 2021-08-27 NOTE — ED Notes (Signed)
Eating meal tray at this time ? ?

## 2021-08-27 NOTE — ED Provider Notes (Signed)
[facility] Provider Note   CSN: [id number] Arrival date & time: 08/27/21  1544     History  Chief Complaint  Patient presents with   Weakness    [name] is a 66 y.o. female.  Pt with hx CKD, c/o feeling a little bit jumpy or anxious. States symptoms gradual onset in past couple weeks, no acute/abrupt change today. Denies new stressors. Denies feeling depressed. Indicates compliant w normal meds - indicates only change was sensipar frequency increased and farxiga added ~ 2 weeks ago. Denies fever, chills or sweats. No headaches. No change in speech or vision. No numbness/weakness. No heat intolerance or sweats. Normal appetite. No uri symptoms. No chest pain or discomfort. No palpitations. No sob or unusual doe. No abd pain or nvd. No dysuria or gu c/o. No recent seizure.   The history is provided by the patient and medical records.  Weakness Associated symptoms: no abdominal pain, no chest pain, no diarrhea, no dysuria, no fever, no headaches, no shortness of breath and no vomiting        Home Medications Prior to Admission medications   Medication Sig Start Date End Date Taking? Authorizing Provider  acetaminophen (TYLENOL) 500 MG tablet Take 1 tablet (500 mg total) by mouth 2 (two) times daily. Patient taking differently: Take 500 mg by mouth 2 (two) times daily as needed for mild pain or headache. 08/15/14   [name], MD  benazepril (LOTENSIN) 20 MG tablet TAKE (1) TABLET BY MOUTH ONCE DAILY. 06/11/21   [name], MD  cinacalcet (SENSIPAR) 30 MG tablet Take by mouth. 02/01/21 02/01/22  [provider]  diltiazem (CARDIZEM CD) 180 MG 24 hr capsule TAKE 1 CAPSULE BY MOUTH ONCE DAILY. 08/12/21   [name], MD  ELIQUIS 5 MG TABS tablet TAKE 1 TABLET BY MOUTH TWICE DAILY. 08/12/21   [name], MD  ezetimibe (ZETIA) 10 MG tablet TAKE ONE TABLET BY MOUTH ONCE DAILY. 08/12/21   [name], MD  FLUoxetine  (PROZAC) 10 MG capsule Take 1 capsule (10 mg total) by mouth daily. 12/02/20   [name], MD  KEPPRA 1000 MG tablet Take 1,000 mg by mouth 2 (two) times daily. 11/14/20   [provider]  LAMICTAL 150 MG tablet TAKE 1 TABLET BY MOUTH ONCE DAILY. 03/07/20   Perlie Mayo, NP  levothyroxine (SYNTHROID) 125 MCG tablet Take 1 tablet (125 mcg total) by mouth daily. 03/27/21   Brita Romp, NP  montelukast (SINGULAIR) 10 MG tablet TAKE (1) TABLET BY MOUTH AT BEDTIME. 08/12/21   [name], MD  omeprazole (PRILOSEC) 20 MG capsule TAKE ONE CAPSULE BY MOUTH ONCE DAILY. 08/12/21   [name], MD  potassium chloride (KLOR-CON) 10 MEQ tablet Take 10 mEq by mouth daily. 03/18/21   [provider]  rosuvastatin (CRESTOR) 40 MG tablet TAKE 1 TABLET BY MOUTH ONCE A DAY. 08/12/21   [name], MD  UNABLE TO FIND [facility] Bath chair Bath mat 06/20/20   [name], MD      Allergies    Simvastatin    Review of Systems   Review of Systems  Constitutional:  Negative for chills and fever.  HENT:  Negative for trouble swallowing.   Eyes:  Negative for visual disturbance.  Respiratory:  Negative for shortness of breath.   Cardiovascular:  Negative for chest pain, palpitations and leg swelling.  Gastrointestinal:  Negative for abdominal pain, diarrhea and  vomiting.  Genitourinary:  Negative for dysuria and flank pain.  Musculoskeletal:  Negative for back pain and neck pain.  Skin:  Negative for rash.  Neurological:  Positive for weakness. Negative for speech difficulty, numbness and headaches.  Psychiatric/Behavioral:  The patient is nervous/anxious.     Physical Exam Updated Vital Signs Pulse 79   Temp 98.1 F (36.7 C) (Oral)   Resp (!) 23   Ht 1.651 m ('5\' 5"'$ )   Wt 108.9 kg   SpO2 100%   BMI 39.94 kg/m  Physical Exam Vitals and nursing note reviewed.  Constitutional:      Appearance: Normal appearance. She is well-developed.  HENT:      Head: Atraumatic.     Nose: Nose normal.     Mouth/Throat:     Mouth: Mucous membranes are moist.  Eyes:     General: No scleral icterus.    Conjunctiva/sclera: Conjunctivae normal.     Pupils: Pupils are equal, round, and reactive to light.  Neck:     Trachea: No tracheal deviation.     Comments: Trachea midline, thyroid not grossly enlarged or tender. No bruits.  Cardiovascular:     Rate and Rhythm: Normal rate and regular rhythm.     Pulses: Normal pulses.     Heart sounds: Normal heart sounds. No murmur heard.    No friction rub. No gallop.  Pulmonary:     Effort: Pulmonary effort is normal. No respiratory distress.     Breath sounds: Normal breath sounds.  Abdominal:     General: Bowel sounds are normal. There is no distension.     Palpations: Abdomen is soft.     Tenderness: There is no abdominal tenderness. There is no guarding.  Genitourinary:    Comments: No cva tenderness.  Musculoskeletal:        General: No swelling or tenderness.     Cervical back: Normal range of motion and neck supple. No rigidity. No muscular tenderness.  Skin:    General: Skin is warm and dry.     Findings: No rash.  Neurological:     Mental Status: She is alert.     Comments: Alert, speech normal. Motor/sens grossly intact bil. No tremor or shakes.   Psychiatric:     Comments: Mildly anxious.      ED Results / Procedures / Treatments   Labs (all labs ordered are listed, but only abnormal results are displayed) Results for orders placed or performed during the hospital encounter of 08/27/21  CBC  Result Value Ref Range   WBC 5.0 4.0 - 10.5 K/uL   RBC 3.99 3.87 - 5.11 MIL/uL   Hemoglobin 11.0 (L) 12.0 - 15.0 g/dL   HCT 35.5 (L) 36.0 - 46.0 %   MCV 89.0 80.0 - 100.0 fL   MCH 27.6 26.0 - 34.0 pg   MCHC 31.0 30.0 - 36.0 g/dL   RDW 13.8 11.5 - 15.5 %   Platelets 178 150 - 400 K/uL   nRBC 0.0 0.0 - 0.2 %  Basic metabolic panel  Result Value Ref Range   Sodium 142 135 - 145 mmol/L    Potassium 4.3 3.5 - 5.1 mmol/L   Chloride 111 98 - 111 mmol/L   CO2 21 (L) 22 - 32 mmol/L   Glucose, Bld 103 (H) 70 - 99 mg/dL   BUN 33 (H) 8 - 23 mg/dL   Creatinine, Ser 2.15 (H) 0.44 - 1.00 mg/dL   Calcium 8.2 (L) 8.9 - 10.3 mg/dL  GFR, Estimated 25 (L) >60 mL/min   Anion gap 10 5 - 15      EKG EKG Interpretation  Date/Time:  Tuesday August 27 2021 15:54:24 EDT Ventricular Rate:  84 PR Interval:  159 QRS Duration: 88 QT Interval:  359 QTC Calculation: 425 R Axis:   -4 Text Interpretation: Sinus rhythm Atrial premature complex Confirmed by Lajean Saver [phone]) on 08/27/2021 4:07:01 PM  Radiology No results found.  Procedures Procedures    Medications Ordered in ED Medications - No data to display  ED Course/ Medical Decision Making/ A&P                           Medical Decision Making Problems Addressed: CKD (chronic kidney disease) stage 4, GFR 15-29 ml/min ([place]): chronic illness or injury that poses a threat to life or bodily functions Dysesthesia: acute illness or injury with systemic symptoms Generalized weakness: acute illness or injury with systemic symptoms  Amount and/or Complexity of Data Reviewed External Data Reviewed: notes. Labs: ordered. Decision-making details documented in ED Course. ECG/medicine tests: ordered and independent interpretation performed. Decision-making details documented in ED Course.  Risk Prescription drug management. Decision regarding hospitalization.   Iv ns. Continuous pulse ox and cardiac monitoring. Labs ordered/sent.   Differential includes electrolyte abn, hyperkalemia, severe anemia, dehydration, etc. - dispo decision including potential need for admission if significant lab abn - will get labs  and reassess.   Reviewed nursing notes and prior charts for additional history. External reports reviewed.   Cardiac monitor: sinus rhythm, rate 78.  Labs reviewed/interpreted by me - k normal. Renal fxn similar to prior.    Ativan .5 mg po.  Po fluids. Food. Ambulate in hall.   Remains in sinus rhythm, no distress.   Pt currently appears stable for d/c.   Rec pcp f/u.  Return precautions provided.          Final Clinical Impression(s) / ED Diagnoses Final diagnoses:  None    Rx / DC Orders ED Discharge Orders     None         Lajean Saver, MD 08/27/21 1757

## 2021-08-27 NOTE — ED Triage Notes (Signed)
Patient feels like she is going to have a seizure. She states she has some weakness and burning of both ears.

## 2021-08-27 NOTE — ED Notes (Signed)
Seizure pads placed on patient bed.

## 2021-08-30 ENCOUNTER — Other Ambulatory Visit: Payer: Self-pay

## 2021-08-30 ENCOUNTER — Ambulatory Visit (INDEPENDENT_AMBULATORY_CARE_PROVIDER_SITE_OTHER): Payer: Medicare Other | Admitting: Family Medicine

## 2021-08-30 ENCOUNTER — Encounter: Payer: Self-pay | Admitting: Family Medicine

## 2021-08-30 DIAGNOSIS — Z1231 Encounter for screening mammogram for malignant neoplasm of breast: Secondary | ICD-10-CM

## 2021-08-30 DIAGNOSIS — E038 Other specified hypothyroidism: Secondary | ICD-10-CM | POA: Diagnosis not present

## 2021-08-30 DIAGNOSIS — I10 Essential (primary) hypertension: Secondary | ICD-10-CM

## 2021-08-30 DIAGNOSIS — N179 Acute kidney failure, unspecified: Secondary | ICD-10-CM | POA: Diagnosis not present

## 2021-08-30 DIAGNOSIS — I482 Chronic atrial fibrillation, unspecified: Secondary | ICD-10-CM | POA: Diagnosis not present

## 2021-08-30 DIAGNOSIS — N1832 Chronic kidney disease, stage 3b: Secondary | ICD-10-CM | POA: Diagnosis not present

## 2021-08-30 NOTE — Assessment & Plan Note (Signed)
Rate controlled and maintained on blood thinner

## 2021-08-30 NOTE — Assessment & Plan Note (Signed)
Advised increase wtaer intake, weakness attributed to dhydration and acute on chronic kidney failure

## 2021-08-30 NOTE — Patient Instructions (Addendum)
F/U as before, call if you need me sooner  Nurse please verify that she has had 2nd shingrix vaccine, if not, she needs one9 [place])  Need covid booster, please get at pharmacy  Increase water intake 2 to 3 bottles per day, NOT only one  It is important that you exercise regularly at least 30 minutes 5 times a week. If you develop chest pain, have severe difficulty breathing, or feel very tired, stop exercising immediately and seek medical attention   Thanks for choosing [facility], we consider it a privelige to serve you.  Please schedule November mammogram at checkout \ Thanks for choosing [facility], we consider it a privelige to serve you.

## 2021-08-30 NOTE — Assessment & Plan Note (Signed)
Controlled, no change in medication  

## 2021-08-30 NOTE — Assessment & Plan Note (Signed)
  Patient re-educated about  the importance of commitment to a  minimum of 150 minutes of exercise per week as able.  The importance of healthy food choices with portion control discussed, as well as eating regularly and within a 12 hour window most days. The need to choose "clean , green" food 50 to 75% of the time is discussed, as well as to make water the primary drink and set a goal of 64 ounces water daily.       08/30/2021   11:13 AM 08/27/2021    3:53 PM 08/12/2021   11:55 AM  Weight /BMI  Weight 240 lb 240 lb 240 lb  Height '5\' 5"'$  (1.651 m) '5\' 5"'$  (1.651 m) '5\' 5"'$  (1.651 m)  BMI 39.94 kg/m2 39.94 kg/m2 39.94 kg/m2

## 2021-08-30 NOTE — Progress Notes (Signed)
   [name]     MRN: [id number]      DOB: [birth year]   HPI [name] is here for follow up from ED visit on 08/27/2021 when she presented thinking she was about to have a seizure and DD is weakness, labs are reviewed and show acute on chronic renal failure and dehydration, she reports drinking 1 bottle of water daily Was seen by her Neurologist less than 2 weeksago ROS Denies recent fever or chills. Denies sinus pressure, nasal congestion, ear pain or sore throat. Denies chest congestion, productive cough or wheezing. Denies chest pains, palpitations and leg swelling Denies abdominal pain, nausea, vomiting,diarrhea or constipation.   Denies dysuria, frequency, hesitancy or incontinence. Chronic  limitation in mobility. Denies headaches,, numbness, or tingling. Denies uncontrolled depression, anxiety or insomnia. Denies skin break down or rash.   PE  BP 126/76   Pulse 96   Ht '5\' 5"'$  (1.651 m)   Wt 240 lb (108.9 kg)   SpO2 96%   BMI 39.94 kg/m   Patient alert and oriented and in no cardiopulmonary distress.  HEENT: No facial asymmetry, EOMI,     Neck supple .  Chest: Clear to auscultation bilaterally.  CVS: S1, S2 no murmurs, no S3.Regular rate.  ABD: Soft non tender.   Ext: No edema  MS: decreased  ROM spine, shoulders, hips and knees.  Skin: Intact, no ulcerations or rash noted.  Psych: Good eye contact, normal affect.  not anxious or depressed appearing.  CNS: CN 2-12 intact, power,  normal throughout.no focal deficits noted.   Assessment & Plan  Acute renal failure superimposed on stage 3 chronic kidney disease (HCC) Advised increase wtaer intake, weakness attributed to dhydration and acute on chronic kidney failure  Essential hypertension Controlled, no change in medication   Atrial fibrillation with RVR (HCC) Rate controlled and maintained on blood thinner  Hypothyroidism Managed by endo and controlled  Morbid obesity ([place])  Patient  re-educated about  the importance of commitment to a  minimum of 150 minutes of exercise per week as able.  The importance of healthy food choices with portion control discussed, as well as eating regularly and within a 12 hour window most days. The need to choose "clean , green" food 50 to 75% of the time is discussed, as well as to make water the primary drink and set a goal of 64 ounces water daily.       08/30/2021   11:13 AM 08/27/2021    3:53 PM 08/12/2021   11:55 AM  Weight /BMI  Weight 240 lb 240 lb 240 lb  Height '5\' 5"'$  (1.651 m) '5\' 5"'$  (1.651 m) '5\' 5"'$  (1.651 m)  BMI 39.94 kg/m2 39.94 kg/m2 39.94 kg/m2

## 2021-08-30 NOTE — Assessment & Plan Note (Signed)
Managed by endo and controlled 

## 2021-09-06 ENCOUNTER — Other Ambulatory Visit: Payer: Self-pay | Admitting: Family Medicine

## 2021-09-18 ENCOUNTER — Ambulatory Visit (HOSPITAL_COMMUNITY)
Admission: RE | Admit: 2021-09-18 | Discharge: 2021-09-18 | Disposition: A | Discharge status: Home / Self Care | Payer: Medicare Other | Source: Ambulatory Visit | Attending: Nurse Practitioner | Admitting: Nurse Practitioner

## 2021-09-18 DIAGNOSIS — E890 Postprocedural hypothyroidism: Secondary | ICD-10-CM | POA: Diagnosis present

## 2021-09-18 DIAGNOSIS — C73 Malignant neoplasm of thyroid gland: Secondary | ICD-10-CM | POA: Insufficient documentation

## 2021-09-24 ENCOUNTER — Ambulatory Visit: Payer: Medicare Other | Admitting: Nurse Practitioner

## 2021-09-25 LAB — TSH: TSH: 0.071 u[IU]/mL — ABNORMAL LOW (ref 0.450–4.500)

## 2021-09-25 LAB — T4, FREE: Free T4: 1.94 ng/dL — ABNORMAL HIGH (ref 0.82–1.77)

## 2021-10-01 ENCOUNTER — Encounter: Payer: Self-pay | Admitting: Nurse Practitioner

## 2021-10-01 ENCOUNTER — Ambulatory Visit (INDEPENDENT_AMBULATORY_CARE_PROVIDER_SITE_OTHER): Payer: Medicare Other | Admitting: Nurse Practitioner

## 2021-10-01 VITALS — BP 123/79 | HR 76 | Ht 65.0 in | Wt 242.0 lb

## 2021-10-01 DIAGNOSIS — C73 Malignant neoplasm of thyroid gland: Secondary | ICD-10-CM | POA: Diagnosis not present

## 2021-10-01 DIAGNOSIS — E890 Postprocedural hypothyroidism: Secondary | ICD-10-CM

## 2021-10-01 MED ORDER — LEVOTHYROXINE SODIUM 125 MCG PO TABS: 125.0000 ug | ORAL_TABLET | Freq: Every day | ORAL | 1 refills | Status: DC

## 2021-10-01 NOTE — Patient Instructions (Signed)

## 2021-10-01 NOTE — Progress Notes (Signed)
10/01/2021      Endocrinology follow-up note     Subjective:    Patient ID: [name], female    DOB: 30-Sep-1955,    Past Medical History:  Diagnosis Date   A-fib ([place])    Anemia    Depression    Dyspnea    Fractures    Hyperlipidemia    Hypertension    Mental retardation, mild (I.Q. 50-70)    lives alone and cares for self has a nurse aide comes once a day    Obesity    Pneumonia    PONV (postoperative nausea and vomiting)    Seizure ([place])    11/17/2012 "still having them; "I believe their from a tumor in my head" ((11/17/2012   Seizure disorder ([place])    Thyroid cancer [facility])    Past Surgical History:  Procedure Laterality Date   ABDOMINAL HYSTERECTOMY  2001   BREAST LUMPECTOMY  1996   right    COLONOSCOPY N/A 05/16/2016   Dr. Oneida Alar: Redundant colon, hemorrhoids, next colonoscopy 10 years   INGUINAL HERNIA REPAIR N/A 08/12/2019   Procedure: laparoscopic incisional hernia repair;  Surgeon: Lucas Mallow, MD;  Location: [facility] ORS;  Service: Urology;  Laterality: N/A;   ROBOTIC ASSITED PARTIAL NEPHRECTOMY Right 08/05/2019   Procedure: XI ROBOTIC ASSITED PARTIAL NEPHRECTOMY WITH INTRAOPERATIVE ULTRASOUND;  Surgeon: [name], MD;  Location: [facility];  Service: Urology;  Laterality: Right;  3 HRS   THYROIDECTOMY Bilateral 11/17/2012   Procedure: TOTAL THYROIDECTOMY;  Surgeon: Ascencion Dike, MD;  Location: [facility] OR;  Service: ENT;  Laterality: Bilateral;   TOTAL THYROIDECTOMY Bilateral 11/17/2012   TUBAL LIGATION     Social History   Socioeconomic History   Marital status: Single    Spouse name: Not on file   Number of children: 2   Years of education: Not on file   Highest education level: Not on file  Occupational History   Occupation: disabled   Tobacco Use   Smoking status: Never   Smokeless tobacco: Never  Vaping Use   Vaping Use: Never used  Substance and Sexual Activity   Alcohol use: No   Drug use: No   Sexual activity: Never  Other Topics Concern    Not on file  Social History Narrative   Not on file   Social Determinants of Health   Financial Resource Strain: Low Risk  (06/13/2020)   Overall Financial Resource Strain (CARDIA)    Difficulty of Paying Living Expenses: Not hard at all  Food Insecurity: No Food Insecurity (06/13/2020)   Hunger Vital Sign    Worried About Running Out of Food in the Last Year: Never true    Ran Out of Food in the Last Year: Never true  Transportation Needs: No Transportation Needs (06/13/2020)   PRAPARE - Hydrologist (Medical): No    Lack of Transportation (Non-Medical): No  Physical Activity: Sufficiently Active (06/13/2020)   Exercise Vital Sign    Days of Exercise per Week: 7 days    Minutes of Exercise per Session: 60 min  Stress: No Stress Concern Present (06/13/2020)   [place]    Feeling of Stress : Not at all  Social Connections: Socially Isolated (06/13/2020)   Social Connection and Isolation Panel [NHANES]    Frequency of Communication with Friends and Family: More than three times a week    Frequency of Social Gatherings with Friends  and Family: Twice a week    Attends Religious Services: Never    Active Member of Clubs or Organizations: No    Attends Archivist Meetings: Never    Marital Status: Never married   Outpatient Encounter Medications as of 10/01/2021  Medication Sig   acetaminophen (TYLENOL) 500 MG tablet Take 1 tablet (500 mg total) by mouth 2 (two) times daily. (Patient taking differently: Take 500 mg by mouth 2 (two) times daily as needed for mild pain or headache.)   benazepril (LOTENSIN) 20 MG tablet TAKE (1) TABLET BY MOUTH ONCE DAILY. (Patient taking differently: Take 20 mg by mouth daily.)   cinacalcet (SENSIPAR) 30 MG tablet Take 30 mg by mouth daily with breakfast.   diltiazem (CARDIZEM CD) 180 MG 24 hr capsule TAKE 1 CAPSULE BY MOUTH ONCE DAILY.   ELIQUIS 5 MG  TABS tablet TAKE 1 TABLET BY MOUTH TWICE DAILY.   ezetimibe (ZETIA) 10 MG tablet TAKE ONE TABLET BY MOUTH ONCE DAILY.   FARXIGA 5 MG TABS tablet Take 5 mg by mouth every morning.   KEPPRA 1000 MG tablet Take 1,000 mg by mouth 2 (two) times daily.   LAMICTAL 150 MG tablet TAKE 1 TABLET BY MOUTH ONCE DAILY.   levothyroxine (SYNTHROID) 125 MCG tablet Take 1 tablet (125 mcg total) by mouth daily.   montelukast (SINGULAIR) 10 MG tablet TAKE (1) TABLET BY MOUTH AT BEDTIME.   omeprazole (PRILOSEC) 20 MG capsule TAKE ONE CAPSULE BY MOUTH ONCE DAILY.   potassium chloride (KLOR-CON) 10 MEQ tablet Take 10 mEq by mouth daily.   rosuvastatin (CRESTOR) 40 MG tablet TAKE 1 TABLET BY MOUTH ONCE A DAY.   UNABLE TO FIND HHS Bath chair Bath mat   [DISCONTINUED] levothyroxine (SYNTHROID) 125 MCG tablet Take 1 tablet (125 mcg total) by mouth daily.   No facility-administered encounter medications on file as of 10/01/2021.   ALLERGIES: Allergies  Allergen Reactions   Simvastatin Other (See Comments)    Muscle aches   VACCINATION STATUS: Immunization History  Administered Date(s) Administered   Fluad Quad(high Dose 65+) 11/29/2019, 11/29/2020   H1N1 12/16/2007   Influenza Split 11/24/2011   Influenza Whole 11/30/2006, 12/07/2008, 12/26/2009   Influenza,inj,Quad PF,6+ Mos 11/18/2012, 12/06/2013, 05/23/2015, 01/29/2016, 12/01/2016, 01/18/2018, 12/01/2018   Pneumococcal Conjugate-13 04/03/2014   Pneumococcal Polysaccharide-23 05/10/2019   Td 08/22/2003   Tdap 04/02/2016   Unspecified SARS-COV-2 Vaccination 05/30/2019, 04/20/2020   Zoster Recombinat (Shingrix) 12/05/2020    HPI  66 year old female patient with medical history as above.  She is here to follow-up for post surgical hypothyroidism and for follow-up of Papillary thyroid cancer.  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 She has had thyrogen stimulated remnant ablation with negative post therapy WBS in 2014.  - Second Thyrogen Stimulated whole body scan for surveillance shows no scintigraphic evidence of iodine avid metastasis papillary thyroid cancer on 04/18/2016.  - Most recent surveillance imaging with Thyrogen stimulated whole-body scan on September 27, 2018 showed no evidence of tumor recurrence.  -Her ultrasound from November 07, 2019 was consistent with surgical changes with no new sign of recurrence or residual tumor.  Her last thyroid imaging on November 07, 2019 also confirmed stable atrophic heterogenous residual right thyroid lobe measuring 2.5 cm x 1.2 cm.  Stability documented since 2019.    She is now on Synthroid 125 mcg p.o. daily before breakfast.  -She has  steady weight since last visit.  She has no new complaints.   Review of systems  Constitutional: + Minimally fluctuating body weight,  current Body mass index is 40.27 kg/m. , no fatigue, no subjective hyperthermia, no subjective hypothermia Eyes: no blurry vision, no xerophthalmia ENT: no sore throat, no nodules palpated in throat, no dysphagia/odynophagia, no hoarseness Cardiovascular: no chest pain, no shortness of breath, no palpitations, no leg swelling Respiratory: no cough, no shortness of breath Gastrointestinal: no nausea/vomiting/diarrhea Musculoskeletal: no muscle/joint aches Skin: no rashes, no hyperemia Neurological: no tremors, no numbness, no tingling, no dizziness Psychiatric: no depression, no anxiety  Objective:    BP 123/79   Pulse 76   Ht '5\' 5"'$  (1.651 m)   Wt 242 lb (109.8 kg)   BMI 40.27 kg/m   Wt Readings from Last 3 Encounters:  10/01/21 242 lb (109.8 kg)  08/30/21 240 lb (108.9 kg)  08/27/21 240 lb (108.9 kg)    BP Readings from Last 3 Encounters:  10/01/21 123/79  08/30/21 126/76  08/27/21 135/62    Physical Exam- Limited  Constitutional:  Body mass index is 40.27 kg/m. , not  in acute distress, normal state of mind Eyes:  EOMI, no exophthalmos Neck: Supple Thyroid: scar from previous thyroidectomy Cardiovascular: RRR, no murmurs, rubs, or gallops, no edema Respiratory: Adequate breathing efforts, no crackles, rales, rhonchi, or wheezing Musculoskeletal: no gross deformities, strength intact in all four extremities, no gross restriction of joint movements Skin:  no rashes, no hyperemia Neurological: no tremor with outstretched hands   Results for orders placed or performed during the hospital encounter of 08/27/21  CBC  Result Value Ref Range   WBC 5.0 4.0 - 10.5 K/uL   RBC 3.99 3.87 - 5.11 MIL/uL   Hemoglobin 11.0 (L) 12.0 - 15.0 g/dL   HCT 35.5 (L) 36.0 - 46.0 %   MCV 89.0 80.0 - 100.0 fL   MCH 27.6 26.0 - 34.0 pg   MCHC 31.0 30.0 - 36.0 g/dL   RDW 13.8 11.5 - 15.5 %   Platelets 178 150 - 400 K/uL   nRBC 0.0 0.0 - 0.2 %  Basic metabolic panel  Result Value Ref Range   Sodium 142 135 - 145 mmol/L   Potassium 4.3 3.5 - 5.1 mmol/L   Chloride 111 98 - 111 mmol/L   CO2 21 (L) 22 - 32 mmol/L   Glucose, Bld 103 (H) 70 - 99 mg/dL   BUN 33 (H) 8 - 23 mg/dL   Creatinine, Ser 2.15 (H) 0.44 - 1.00 mg/dL   Calcium 8.2 (L) 8.9 - 10.3 mg/dL   GFR, Estimated 25 (L) >60 mL/min   Anion gap 10 5 - 15   Complete Blood Count (Most recent): Lab Results  Component Value Date   WBC 5.0 08/27/2021   HGB 11.0 (L) 08/27/2021   HCT 35.5 (L) 08/27/2021   MCV 89.0 08/27/2021   PLT 178 08/27/2021   Diabetic Labs (most recent): Lab Results  Component Value Date   HGBA1C 5.2 05/26/2017   HGBA1C 5.3 08/10/2012   Lipid Panel     Component Value Date/Time   CHOL 139 04/30/2021 1107   TRIG 77 04/30/2021 1107   HDL 50 04/30/2021 1107   CHOLHDL 2.8 04/30/2021 1107   CHOLHDL 3.3 10/06/2019 0951   VLDL 14 01/29/2016 1109   LDLCALC 74 04/30/2021 1107   LDLCALC 80 10/06/2019 0951    June 01, 2017  IMPRESSION:  Questioned approximately 2.2 cm soft tissue within  the right lobectomy resection bed appears similar to the 07/2015 examination.   Note, while nonspecific, this tissue was apparently present at the time of the Thyrogen stimulated I 131 nuclear medicine whole body scan performed 03/2016 which was negative for the presence of iodine avid metastatic thyroid cancer or residual disease, and thus favored to be benign etiology. Clinical correlation is advised.  Thyrogen stimulated whole-body scan September 27, 2018 fINDINGS:  Excreted tracer within colon and urinary bladder. Small amount of retained gastric activity. Pattern is similar to that seen on the previous exam.   No sites of iodine-avid metastatic thyroid cancer identified. No residual tracer accumulation in the neck.   IMPRESSION: No scintigraphic evidence of iodine-avid metastatic thyroid cancer.     Ultrasound of thyroid on November 07, 2019 -Right lobe 2.5 x 1.2 x 0.9 cm, left lobe surgically absent. Stable atrophic heterogeneous residual right thyroid lobe as before. No hypervascularity. No left thyroid bed abnormality. No regional adenopathy.   IMPRESSION: Stable thyroid ultrasound compared 2019 as above.  Results for COOPER, STAMP (MRN [id number]) as of 09/24/2020 09:20  Ref. Range 09/12/2020 10:02  TSH Latest Ref Range: 0.450 - 4.500 uIU/mL 0.356 (L)  T4,Free(Direct) Latest Ref Range: 0.82 - 1.77 ng/dL 1.49    Latest Reference Range & Units 11/03/19 09:49 03/08/20 09:04 09/12/20 10:02 03/22/21 08:30 09/24/21 09:22  TSH 0.450 - 4.500 uIU/mL 0.19 (L) 0.538 0.356 (L) 0.200 (L) 0.071 (L)  T4,Free(Direct) 0.82 - 1.77 ng/dL 1.9 (H) 1.76 1.49 1.87 (H) 1.94 (H)  Thyroglobulin ng/mL 0.3 (L)      Thyroglobulin Ab < or = 1 IU/mL <1      THYROGLOBULIN (TG-RIA) ng/mL  <2.0     (L): Data is abnormally low (H): Data is abnormally high Assessment & Plan:   1. Hypothyroidism, postsurgical -Her previsit thyroid function tests are consistent with slight over-replacement, however given  her lack of symptoms (and history of thyroid cancer) thyroid should be kept slightly over-active.  Therefore she will be kept on same dose of thyroid medication for now at Levothyroxine 125 mcg po daily before breakfast.    - We discussed about the correct intake of her thyroid hormone, on empty stomach at fasting, with water, separated by at least 30 minutes from breakfast and other medications,  and separated by more than 4 hours from calcium, iron, multivitamins, acid reflux medications (PPIs). -Patient is made aware of the fact that thyroid hormone replacement is needed for life, dose to be adjusted by periodic monitoring of thyroid function tests.  - Target TSH for her would be between 0.1-0.5.  2. Papillary thyroid carcinoma ([place]) Her surveillance neck /thyroid u/s from 11/17/2013 was c/w surgically absent thyroid, however there is a 0.9 cm left cervical lymph node. See below.  Pt is s/p thyrogen stimulated thyroid remnant ablation with WBS on 01/14/2013 with no evidence of iodine avid metastasis. She has had pT2,Nx,Mx FVPTC, s/p near total thyroidectomy on September 24,2014. She is counseled to maintain high degree of compliance with synthroid therapy with aim of keeping TSH low normal ( 0.1-0.5).  -she is advised on the necessity of follow-ups and imaging studies at least one annually for the next 5 years. - She was sent for core biopsy of 0.9 cm left cervical lymph node last visit. However, this procedure was abandoned due to " no evidence of adenopathy".  The previously noticed  left cervical lymph node was sent to decrease in size to 0.5 cm. Medicaid did not cover surveillance  ultrasound. - Second Thyrogen estimated or body scan for surveillance shows no scintigraphic evidence of iodine avid metastasis papillary thyroid cancer on 04/18/2016. -June 01, 2017 surveillance thyroid/neck ultrasound : Unremarkable, see above.   -She underwent  Thyrogen stimulated whole body scan which is  negative for tumor recurrence.   Her recent thyroid/neck ultrasound from September 2021 showed stable atrophic heterogenous residual thyroid lobe measuring 2.5 x1.2 cm observed to have been stable since 2019.  She will not need any immediate intervention for this at this time.   She had follow up surveillance thyroid ultrasound prior to today's visit which shows no areas of concern.    She is advised to keep close follow-up with Dr. Tula Nakayama for primary care needs.    I spent 20 minutes in the care of the patient today including review of labs from Thyroid Function, CMP, and other relevant labs ; imaging/biopsy records (current and previous including abstractions from other facilities); face-to-face time discussing  her lab results and symptoms, medications doses, her options of short and long term treatment based on the latest standards of care / guidelines;   and documenting the encounter.  [name]  participated in the discussions, expressed understanding, and voiced agreement with the above plans.  All questions were answered to her satisfaction. she is encouraged to contact clinic should she have any questions or concerns prior to her return visit.   Follow up plan: Return in about 6 months (around 04/03/2022) for Thyroid follow up, Previsit labs.  Rayetta Pigg, [facility] [address] [place], [place] [address] Phone: [phone] Fax: [phone]   10/01/2021, 11:12 AM

## 2021-10-09 ENCOUNTER — Other Ambulatory Visit: Payer: Self-pay | Admitting: Family Medicine

## 2021-10-31 ENCOUNTER — Ambulatory Visit (INDEPENDENT_AMBULATORY_CARE_PROVIDER_SITE_OTHER): Payer: Medicare Other | Admitting: Family Medicine

## 2021-10-31 ENCOUNTER — Encounter: Payer: Self-pay | Admitting: Family Medicine

## 2021-10-31 VITALS — BP 138/80 | HR 68 | Resp 16 | Ht 65.0 in | Wt 240.1 lb

## 2021-10-31 DIAGNOSIS — Z23 Encounter for immunization: Secondary | ICD-10-CM | POA: Diagnosis not present

## 2021-10-31 DIAGNOSIS — I10 Essential (primary) hypertension: Secondary | ICD-10-CM | POA: Diagnosis not present

## 2021-10-31 DIAGNOSIS — E559 Vitamin D deficiency, unspecified: Secondary | ICD-10-CM

## 2021-10-31 DIAGNOSIS — E038 Other specified hypothyroidism: Secondary | ICD-10-CM

## 2021-10-31 DIAGNOSIS — E785 Hyperlipidemia, unspecified: Secondary | ICD-10-CM | POA: Diagnosis not present

## 2021-10-31 DIAGNOSIS — R569 Unspecified convulsions: Secondary | ICD-10-CM

## 2021-10-31 MED ORDER — APIXABAN 5 MG PO TABS: 5.0000 mg | ORAL_TABLET | Freq: Two times a day (BID) | ORAL | 5 refills | Status: DC

## 2021-10-31 NOTE — Progress Notes (Signed)
[name]     MRN: [id number]      DOB: 1956-01-08   HPI [name] is here for follow up and re-evaluation of chronic medical conditions, medication management and review of any available recent lab and radiology data.  Preventive health is updated, specifically  Cancer screening and Immunization.   Questions or concerns regarding consultations or procedures which the PT has had in the interim are  addressed. The PT denies any adverse reactions to current medications since the last visit.  There are no new concerns.  There are no specific complaints   ROS Denies recent fever or chills. Denies sinus pressure, nasal congestion, ear pain or sore throat. Denies chest congestion, productive cough or wheezing. Denies chest pains, palpitations and leg swelling Denies abdominal pain, nausea, vomiting,diarrhea or constipation.   Denies dysuria, frequency, hesitancy or incontinence. Chronic  joint pain, swelling and limitation in mobility. Denies headaches, seizures, numbness, or tingling. Denies depression, anxiety or insomnia. Denies skin break down or rash.   PE  BP 138/80   Pulse 68   Resp 16   Ht '5\' 5"'$  (1.651 m)   Wt 240 lb 1.9 oz (108.9 kg)   SpO2 95%   BMI 39.96 kg/m   Patient alert and oriented and in no cardiopulmonary distress.  HEENT: No facial asymmetry, EOMI,     Neck supple .  Chest: Clear to auscultation bilaterally.  CVS: S1, S2 no murmurs, no S3.Regular rate.  ABD: Soft non tender.   Ext: No edema  MS: Adequate ROM spine, shoulders, hips and knees.  Skin: Intact, no ulcerations or rash noted.  Psych: Good eye contact, normal affect. Memory intact not anxious or depressed appearing.  CNS: CN 2-12 intact, power,  normal throughout.no focal deficits noted.   Assessment & Plan  Essential hypertension Controlled, no change in medication DASH diet and commitment to daily physical activity for a minimum of 30 minutes discussed and encouraged, as a  part of hypertension management. The importance of attaining a healthy weight is also discussed.     10/31/2021   11:04 AM 10/31/2021   10:54 AM 10/01/2021   11:06 AM 08/30/2021   11:13 AM 08/27/2021    6:00 PM 08/27/2021    5:30 PM 08/27/2021    5:00 PM  BP/Weight  Systolic BP 811 914 [phone] 578  Diastolic BP 80 70 79 76 62 69 60  Wt. (Lbs)  240.12 242 240     BMI  39.96 kg/m2 40.27 kg/m2 39.94 kg/m2          Morbid obesity (HCC)  Patient re-educated about  the importance of commitment to a  minimum of 150 minutes of exercise per week as able.  The importance of healthy food choices with portion control discussed, as well as eating regularly and within a 12 hour window most days. The need to choose "clean , green" food 50 to 75% of the time is discussed, as well as to make water the primary drink and set a goal of 64 ounces water daily.       10/31/2021   10:54 AM 10/01/2021   11:06 AM 08/30/2021   11:13 AM  Weight /BMI  Weight 240 lb 1.9 oz 242 lb 240 lb  Height '5\' 5"'$  (1.651 m) '5\' 5"'$  (1.651 m) '5\' 5"'$  (1.651 m)  BMI 39.96 kg/m2 40.27 kg/m2 39.94 kg/m2      Hypothyroidism Followed by endo and has upcoming appt  Hyperlipidemia LDL goal <  100 Hyperlipidemia:Low fat diet discussed and encouraged.   Lipid Panel  Lab Results  Component Value Date   CHOL 139 04/30/2021   HDL 50 04/30/2021   LDLCALC 74 04/30/2021   TRIG 77 04/30/2021   CHOLHDL 2.8 04/30/2021     Controlled, no change in medication Updated lab needed at/ before next visit.   Seizures ([place]) Controlled and managed by Neurology

## 2021-10-31 NOTE — Assessment & Plan Note (Signed)
Hyperlipidemia:Low fat diet discussed and encouraged.   Lipid Panel  Lab Results  Component Value Date   CHOL 139 04/30/2021   HDL 50 04/30/2021   LDLCALC 74 04/30/2021   TRIG 77 04/30/2021   CHOLHDL 2.8 04/30/2021     Controlled, no change in medication Updated lab needed at/ before next visit.

## 2021-10-31 NOTE — Assessment & Plan Note (Signed)
Controlled, no change in medication DASH diet and commitment to daily physical activity for a minimum of 30 minutes discussed and encouraged, as a part of hypertension management. The importance of attaining a healthy weight is also discussed.     10/31/2021   11:04 AM 10/31/2021   10:54 AM 10/01/2021   11:06 AM 08/30/2021   11:13 AM 08/27/2021    6:00 PM 08/27/2021    5:30 PM 08/27/2021    5:00 PM  BP/Weight  Systolic BP 287 681 157 262 035 597 416  Diastolic BP 80 70 79 76 62 69 60  Wt. (Lbs)  240.12 242 240     BMI  39.96 kg/m2 40.27 kg/m2 39.94 kg/m2

## 2021-10-31 NOTE — Assessment & Plan Note (Signed)
  Patient re-educated about  the importance of commitment to a  minimum of 150 minutes of exercise per week as able.  The importance of healthy food choices with portion control discussed, as well as eating regularly and within a 12 hour window most days. The need to choose "clean , green" food 50 to 75% of the time is discussed, as well as to make water the primary drink and set a goal of 64 ounces water daily.       10/31/2021   10:54 AM 10/01/2021   11:06 AM 08/30/2021   11:13 AM  Weight /BMI  Weight 240 lb 1.9 oz 242 lb 240 lb  Height '5\' 5"'$  (1.651 m) '5\' 5"'$  (1.651 m) '5\' 5"'$  (1.651 m)  BMI 39.96 kg/m2 40.27 kg/m2 39.94 kg/m2

## 2021-10-31 NOTE — Assessment & Plan Note (Signed)
Followed by endo and has upcoming appt

## 2021-10-31 NOTE — Patient Instructions (Addendum)
F/U first week in Feb, call if you need me sooner  No changes in medication  Flu vaccine today  Shingles # 2 needed  plan to get next week  Covid booster needed  at end September  or early October   Fasting lipid, cmp and EGFR and vit D end January  It is important that you exercise regularly at least 30 minutes 5 times a week. If you develop chest pain, have severe difficulty breathing, or feel very tired, stop exercising immediately and seek medical attention   Thanks for choosing [facility], we consider it a privelige to serve you.

## 2021-10-31 NOTE — Assessment & Plan Note (Signed)
Controlled and managed by Neurology

## 2021-11-11 ENCOUNTER — Other Ambulatory Visit: Payer: Self-pay | Admitting: Family Medicine

## 2021-12-11 ENCOUNTER — Other Ambulatory Visit: Payer: Self-pay | Admitting: Family Medicine

## 2021-12-26 ENCOUNTER — Encounter: Payer: Self-pay | Admitting: Neurology

## 2021-12-26 ENCOUNTER — Inpatient Hospital Stay (HOSPITAL_COMMUNITY): Admission: RE | Admit: 2021-12-26 | Payer: Medicare Other | Source: Ambulatory Visit

## 2022-01-10 ENCOUNTER — Other Ambulatory Visit: Payer: Self-pay | Admitting: Family Medicine

## 2022-01-13 ENCOUNTER — Ambulatory Visit (HOSPITAL_COMMUNITY)
Admission: RE | Admit: 2022-01-13 | Discharge: 2022-01-13 | Disposition: A | Discharge status: Home / Self Care | Payer: Medicare Other | Source: Ambulatory Visit | Attending: Family Medicine | Admitting: Family Medicine

## 2022-01-13 DIAGNOSIS — Z1231 Encounter for screening mammogram for malignant neoplasm of breast: Secondary | ICD-10-CM | POA: Diagnosis present

## 2022-01-17 ENCOUNTER — Other Ambulatory Visit: Payer: Self-pay | Admitting: Nurse Practitioner

## 2022-01-17 DIAGNOSIS — E890 Postprocedural hypothyroidism: Secondary | ICD-10-CM

## 2022-01-17 DIAGNOSIS — C73 Malignant neoplasm of thyroid gland: Secondary | ICD-10-CM

## 2022-01-22 ENCOUNTER — Ambulatory Visit (INDEPENDENT_AMBULATORY_CARE_PROVIDER_SITE_OTHER): Payer: Medicare Other | Admitting: Neurology

## 2022-01-22 ENCOUNTER — Encounter: Payer: Self-pay | Admitting: Neurology

## 2022-01-22 VITALS — BP 125/85 | HR 108 | Resp 20 | Ht 65.0 in | Wt 241.0 lb

## 2022-01-22 DIAGNOSIS — G40009 Localization-related (focal) (partial) idiopathic epilepsy and epileptic syndromes with seizures of localized onset, not intractable, without status epilepticus: Secondary | ICD-10-CM

## 2022-01-22 MED ORDER — LAMICTAL 150 MG PO TABS: ORAL_TABLET | ORAL | 3 refills | Status: DC

## 2022-01-22 MED ORDER — LEVETIRACETAM ER 750 MG PO TB24: ORAL_TABLET | ORAL | 3 refills | Status: DC

## 2022-01-22 NOTE — Progress Notes (Signed)
NEUROLOGY CONSULTATION NOTE  DORSIE SETHI MRN: [id number] DOB: 1956/01/19  Referring provider: Dr. Phillips Odor Primary care provider: Dr. Tula Nakayama  Reason for consult:  establish care for seizures  Dear Dr Merlene Laughter:  Thank you for your kind referral of [name] for consultation of the above symptoms. Although her history is well known to you, please allow me to reiterate it for the purpose of our medical record. She is alone in the office today. Records and images were personally reviewed where available.   HISTORY OF PRESENT ILLNESS: This is a 66 year old right-handed woman with a history of hypertension, hyperlipidemia, hypothyroidism, atrial fibrillation on Eliquis, partial nephrectomy, mild intellectual disability, presenting to establish care for seizures as Dr. Merlene Laughter is closing his office. She is a poor historian but able to answer questions adequately. She states seizures started as a baby. She does not recall prior medications she had taken. She reports that she has been having "little ones" that want to come, she feels like she is dizzy and "going out of my head." She felt this way this morning. She feels this just started when "they changed my medications." Last note from Dr. Freddie Apley office from June 2023 indicate that her Levetiracetam was switched to extended-release. She was in the ER the day prior on 08/12/21 after having 2 seizures at home. Her daughter had previously described seizures as spacing out then collapsing and shaking with tongue bite, sleepiness and disorientation after. She states this was the last time she had a GTC. She has been told she would stare off, the last time she was told was some time this month. Sometimes she notices a metallic taste/dry mouth. She denies any focal numbness/tingling/weakness or myoclonic jerks. She has pain on her right knee from arthritis. She denies any headaches, diplopia, dysarthria/dysphagia, neck/back pain,  bowel/bladder dysfunction. She reports sleeping well, then stated she wakes up several times at night, no daytime drowsiness. She lives alone, her daughter lives next door. She has an aide coming daily to helps with meals. Her daughter manages finances. She manages her own medications with a pillpack and denies missing any doses. On her last visit with Dr. Merlene Laughter, daughter was concerned she was not taking medications properly despite being prepackaged. She is on Levetiracetam ER '750mg'$  2 tabs BID ('1500mg'$  BID) and Lamotrigine '150mg'$  1/2 tablet BID ('75mg'$  BID). Per notes, she did not tolerate higher dose of Lamotrigine and was sleepy on immediate-release Levetiracetam.   Epilepsy Risk Factors:  Mild intellectual disability. There is no history of febrile convulsions, CNS infections such as meningitis/encephalitis, significant traumatic brain injury, neurosurgical procedures, or family history of seizures.  Diagnostic Data: I personally reviewed MRI brain without contrast done 06/2013 with asymmetric atrophy and mildly increased T2 signal in the left hippocampus. Mild chronic microvascular disease. There was marked, disproportionate cerebellar atrophy.  EEG from 2020 was normal.     PAST MEDICAL HISTORY: Past Medical History:  Diagnosis Date   A-fib ([place])    Anemia    Depression    Dyspnea    Fractures    Hyperlipidemia    Hypertension    Mental retardation, mild (I.Q. 50-70)    lives alone and cares for self has a nurse aide comes once a day    Obesity    Pneumonia    PONV (postoperative nausea and vomiting)    Seizure ([place])    11/17/2012 "still having them; "I believe their from a tumor in my head" ((11/17/2012  Seizure disorder ([place])    Thyroid cancer ([place])     PAST SURGICAL HISTORY: Past Surgical History:  Procedure Laterality Date   ABDOMINAL HYSTERECTOMY  2001   BREAST LUMPECTOMY  1996   right    COLONOSCOPY N/A 05/16/2016   Dr. Oneida Alar: Redundant colon, hemorrhoids, next  colonoscopy 10 years   INGUINAL HERNIA REPAIR N/A 08/12/2019   Procedure: laparoscopic incisional hernia repair;  Surgeon: Lucas Mallow, MD;  Location: [facility];  Service: Urology;  Laterality: N/A;   ROBOTIC ASSITED PARTIAL NEPHRECTOMY Right 08/05/2019   Procedure: XI ROBOTIC ASSITED PARTIAL NEPHRECTOMY WITH INTRAOPERATIVE ULTRASOUND;  Surgeon: [name], MD;  Location: [facility];  Service: Urology;  Laterality: Right;  3 HRS   THYROIDECTOMY Bilateral 11/17/2012   Procedure: TOTAL THYROIDECTOMY;  Surgeon: Ascencion Dike, MD;  Location: [facility] OR;  Service: ENT;  Laterality: Bilateral;   TOTAL THYROIDECTOMY Bilateral 11/17/2012   TUBAL LIGATION      MEDICATIONS: Current Outpatient Medications on File Prior to Visit  Medication Sig Dispense Refill   acetaminophen (TYLENOL) 500 MG tablet Take 1 tablet (500 mg total) by mouth 2 (two) times daily. (Patient taking differently: Take 500 mg by mouth 2 (two) times daily as needed for mild pain or headache.) 60 tablet 5   apixaban (ELIQUIS) 5 MG TABS tablet Take 1 tablet (5 mg total) by mouth 2 (two) times daily. 60 tablet 5   benazepril (LOTENSIN) 20 MG tablet TAKE (1) TABLET BY MOUTH ONCE DAILY. (Patient taking differently: Take 20 mg by mouth daily.) 30 tablet 0   cinacalcet (SENSIPAR) 30 MG tablet Take 30 mg by mouth daily with breakfast.     diltiazem (CARDIZEM CD) 180 MG 24 hr capsule TAKE 1 CAPSULE BY MOUTH ONCE DAILY. 30 capsule 0   ELIQUIS 5 MG TABS tablet TAKE 1 TABLET BY MOUTH TWICE DAILY. 60 tablet 0   ezetimibe (ZETIA) 10 MG tablet TAKE ONE TABLET BY MOUTH ONCE DAILY. 30 tablet 0   FARXIGA 5 MG TABS tablet Take 5 mg by mouth every morning.     LAMICTAL 150 MG tablet TAKE 1 TABLET BY MOUTH ONCE DAILY. (Patient taking differently: 1/2 tablet twice daily) 30 tablet 0   levETIRAcetam (KEPPRA XR) 750 MG 24 hr tablet Take 750 mg by mouth daily. Take 2 tablets twice a day     levothyroxine (SYNTHROID) 125 MCG tablet TAKE ONE TABLET BY MOUTH ONCE  DAILY. 90 tablet 0   montelukast (SINGULAIR) 10 MG tablet TAKE (1) TABLET BY MOUTH AT BEDTIME. 30 tablet 0   omeprazole (PRILOSEC) 20 MG capsule TAKE ONE CAPSULE BY MOUTH ONCE DAILY. 30 capsule 0   potassium chloride (KLOR-CON) 10 MEQ tablet Take 10 mEq by mouth daily.     rosuvastatin (CRESTOR) 40 MG tablet TAKE 1 TABLET BY MOUTH ONCE A DAY. 30 tablet 0   UNABLE TO FIND HHS Bath chair Bath mat 1 each 0   No current facility-administered medications on file prior to visit.    ALLERGIES: Allergies  Allergen Reactions   Simvastatin Other (See Comments)    Muscle aches    FAMILY HISTORY: Family History  Problem Relation Age of Onset   Diabetes Mother    Colon cancer Neg Hx     SOCIAL HISTORY: Social History   Socioeconomic History   Marital status: Single    Spouse name: Not on file   Number of children: 2   Years of education: Not on file   Highest education level:  Not on file  Occupational History   Occupation: disabled   Tobacco Use   Smoking status: Never   Smokeless tobacco: Never  Vaping Use   Vaping Use: Never used  Substance and Sexual Activity   Alcohol use: No   Drug use: No   Sexual activity: Never  Other Topics Concern   Not on file  Social History Narrative   Right handed   Caffeine intake yes   Retired   Lives alone   Social Determinants of Health   Financial Resource Strain: [place]  (06/13/2020)   Overall Financial Resource Strain (CARDIA)    Difficulty of Paying Living Expenses: Not hard at all  Food Insecurity: No Food Insecurity (06/13/2020)   Hunger Vital Sign    Worried About Running Out of Food in the Last Year: Never true    Ran Out of Food in the Last Year: Never true  Transportation Needs: No Transportation Needs (06/13/2020)   PRAPARE - Hydrologist (Medical): No    Lack of Transportation (Non-Medical): No  Physical Activity: Sufficiently Active (06/13/2020)   Exercise Vital Sign    Days of Exercise  per Week: 7 days    Minutes of Exercise per Session: 60 min  Stress: No Stress Concern Present (06/13/2020)   [place]    Feeling of Stress : Not at all  Social Connections: Socially Isolated (06/13/2020)   Social Connection and Isolation Panel [NHANES]    Frequency of Communication with Friends and Family: More than three times a week    Frequency of Social Gatherings with Friends and Family: Twice a week    Attends Religious Services: Never    Marine scientist or Organizations: No    Attends Archivist Meetings: Never    Marital Status: Never married  Intimate Partner Violence: Not At Risk (06/13/2020)   Humiliation, Afraid, Rape, and Kick questionnaire    Fear of Current or Ex-Partner: No    Emotionally Abused: No    Physically Abused: No    Sexually Abused: No     PHYSICAL EXAM: Vitals:   01/22/22 1026  BP: 125/85  Pulse: (!) 108  Resp: 20  SpO2: 95%   General: No acute distress Head:  Normocephalic/atraumatic Skin/Extremities: No rash, no edema Neurological Exam: Mental status: alert and oriented to person, place, and time, no dysarthria or aphasia, Fund of knowledge is appropriate.  Recent and remote memory are intact, 2/3 delayed recall.  Attention and concentration are reduced, 1/5 WORLD backwards.  Cranial nerves: CN I: not tested CN II: pupils equal, round, visual fields intact CN III, IV, VI:  full range of motion, no nystagmus, no ptosis CN V: facial sensation intact CN VII: upper and lower face symmetric CN VIII: hearing intact to conversation Bulk & Tone: normal, no fasciculations. Motor: 5/5 throughout with no pronator drift. Sensation: intact to light touch, cold, pin, vibration sense.  No extinction to double simultaneous stimulation.  Romberg test negative Deep Tendon Reflexes: +2 on left UE, otherwise +1 throughout Cerebellar: no incoordination on finger to nose  testing Gait: slow and cautious, wide-based, no ataxia Tremor: none   IMPRESSION: This is a 66 year old right-handed woman with a history of hypertension, hyperlipidemia, hypothyroidism, atrial fibrillation on Eliquis, partial nephrectomy, mild intellectual disability, presenting to establish care for seizures as Dr. Merlene Laughter is closing his office. She is a poor historian, she reports last GTC was in  June 2023 but that she has smaller ones and staring spells on current regimen. MRI brain with and without contrast and EEG will be ordered. 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 We may consider prolonged ambulatory EEG to characterize symptoms. For now, we agreed to continue on her regimen of Levetiracetam ER '750mg'$  2 tabs BID and Lamotrigine '150mg'$  1/2 tablet BID, refills sent. She does not drive. Follow-up in 3 months, call for any changes.   Thank you for allowing me to participate in the care of this patient. Please do not hesitate to call for any questions or concerns.   Ellouise Newer, M.D.  CC: Dr. Moshe Cipro, Dr. Merlene Laughter

## 2022-01-22 NOTE — Patient Instructions (Signed)
Good to meet you.  Schedule MRI brain with and without contrast  2. Schedule EEG  3. Continue all your medications  4. Keep a calendar of your seizures  5. Follow-up in 3 months, call for any changes   Seizure Precautions: 1. If medication has been prescribed for you to prevent seizures, take it exactly as directed.  Do not stop taking the medicine without talking to your doctor first, even if you have not had a seizure in a long time.   2. Avoid activities in which a seizure would cause danger to yourself or to others.  Don't operate dangerous machinery, swim alone, or climb in high or dangerous places, such as on ladders, roofs, or girders.  Do not drive unless your doctor says you may.  3. If you have any warning that you may have a seizure, lay down in a safe place where you can't hurt yourself.    4.  No driving for 6 months from last seizure, as per [facility].   Please refer to the following link on the [place] website for more information: http://[email or web address].org/[email or web address]/driving/drivingu.cfm   5.  Maintain good sleep hygiene.  6.  Contact your doctor if you have any problems that may be related to the medicine you are taking.  7.  Call 911 and bring the patient back to the ED if:        A.  The seizure lasts longer than 5 minutes.       B.  The patient doesn't awaken shortly after the seizure  C.  The patient has new problems such as difficulty seeing, speaking or moving  D.  The patient was injured during the seizure  E.  The patient has a temperature over 102 F (39C)  F.  The patient vomited and now is having trouble breathing

## 2022-01-29 ENCOUNTER — Other Ambulatory Visit: Payer: Medicare Other

## 2022-01-31 ENCOUNTER — Ambulatory Visit (INDEPENDENT_AMBULATORY_CARE_PROVIDER_SITE_OTHER): Payer: Medicare Other | Admitting: Neurology

## 2022-01-31 DIAGNOSIS — G40009 Localization-related (focal) (partial) idiopathic epilepsy and epileptic syndromes with seizures of localized onset, not intractable, without status epilepticus: Secondary | ICD-10-CM

## 2022-01-31 NOTE — Progress Notes (Unsigned)
EEG complete - results pending 

## 2022-02-05 NOTE — Procedures (Signed)
ELECTROENCEPHALOGRAM REPORT  Date of Study: 01/31/2022  Patient's Name: [name] MRN: [id number] Date of Birth: 1955/08/25  Referring Provider: Dr. Ellouise Newer  Clinical History: This is a 66 year old woman with staring spells and convulsions. EEG for classification.  Medications: Lamictal, Keppra, Eliquis, Farxiga, Cardizem, Lotensin, Zetia  Technical Summary: A multichannel digital EEG recording measured by the international 10-20 system with electrodes applied with paste and impedances below 5000 ohms performed in our laboratory with EKG monitoring in an awake and asleep patient.  Hyperventilation was not performed. Photic stimulation wasperformed.  The digital EEG was referentially recorded, reformatted, and digitally filtered in a variety of bipolar and referential montages for optimal display.    Description: The patient is awake and asleep during the recording.  During maximal wakefulness, there is a symmetric, medium voltage 10 Hz posterior dominant rhythm that attenuates with eye opening.  The record is symmetric.  During drowsiness and sleep, there is an increase in theta slowing of the background, at times sharply contoured over the left temporal region without clear epileptogenic potential.  Vertex waves and symmetric sleep spindles were seen. Photic stimulation did not elicit any abnormalities.  There were no clear epileptiform discharges or electrographic seizures seen.    EKG lead showed occasional extrasystolic beats.  Impression: This awake and asleep EEG is within normal limits.  Clinical Correlation: A normal EEG does not exclude a clinical diagnosis of epilepsy.  If further clinical questions remain, prolonged EEG may be helpful.  Clinical correlation is advised.   Ellouise Newer, M.D.

## 2022-02-06 ENCOUNTER — Ambulatory Visit: Payer: Medicare Other | Attending: Cardiology | Admitting: Cardiology

## 2022-02-06 ENCOUNTER — Encounter: Payer: Self-pay | Admitting: Cardiology

## 2022-02-06 VITALS — BP 117/70 | HR 77 | Ht 65.0 in | Wt 241.0 lb

## 2022-02-06 DIAGNOSIS — D6869 Other thrombophilia: Secondary | ICD-10-CM | POA: Diagnosis not present

## 2022-02-06 DIAGNOSIS — E782 Mixed hyperlipidemia: Secondary | ICD-10-CM | POA: Diagnosis not present

## 2022-02-06 DIAGNOSIS — I10 Essential (primary) hypertension: Secondary | ICD-10-CM | POA: Diagnosis not present

## 2022-02-06 DIAGNOSIS — I480 Paroxysmal atrial fibrillation: Secondary | ICD-10-CM

## 2022-02-06 NOTE — Patient Instructions (Signed)
Medication Instructions:  Your physician recommends that you continue on your current medications as directed. Please refer to the Current Medication list given to you today.   Labwork: None  Testing/Procedures: None  Follow-Up: Follow up with Dr. Branch in 1 year.   Any Other Special Instructions Will Be Listed Below (If Applicable).     If you need a refill on your cardiac medications before your next appointment, please call your pharmacy.  

## 2022-02-06 NOTE — Progress Notes (Addendum)
Clinical Summary [name] is a 66 y.o.female seen today as a new consult, referred by Dr Moshe Cipro for the following medical problems.    1. PAF - appears to be new diagnosis 07/2019 during admission with [facility] - she is on diltiazem, eliquis  - infrequent palpitations.  - compliant with meds, no bleeding eliquis   2. HTN - she is compliant with meds -    3. Hyperlipidemia - Jan 2022 TC 238 TG 84 HDL 51 LDL 172 - muscle aches on simvastatin 04/2021 TC 139 TG 77 HDL 50 LDL 74  4. CKD - followed by Dr Theador Hawthorne Past Medical History:  Diagnosis Date   A-fib ([place])    Anemia    Depression    Dyspnea    Fractures    Hyperlipidemia    Hypertension    Mental retardation, mild (I.Q. 50-70)    lives alone and cares for self has a nurse aide comes once a day    Obesity    Pneumonia    PONV (postoperative nausea and vomiting)    Seizure ([place])    11/17/2012 "still having them; "I believe their from a tumor in my head" ((11/17/2012   Seizure disorder ([place])    Thyroid cancer ([facility])      Allergies  Allergen Reactions   Simvastatin Other (See Comments)    Muscle aches     Current Outpatient Medications  Medication Sig Dispense Refill   acetaminophen (TYLENOL) 500 MG tablet Take 1 tablet (500 mg total) by mouth 2 (two) times daily. (Patient taking differently: Take 500 mg by mouth 2 (two) times daily as needed for mild pain or headache.) 60 tablet 5   apixaban (ELIQUIS) 5 MG TABS tablet Take 1 tablet (5 mg total) by mouth 2 (two) times daily. 60 tablet 5   benazepril (LOTENSIN) 20 MG tablet TAKE (1) TABLET BY MOUTH ONCE DAILY. (Patient taking differently: Take 20 mg by mouth daily.) 30 tablet 0   diltiazem (CARDIZEM CD) 180 MG 24 hr capsule TAKE 1 CAPSULE BY MOUTH ONCE DAILY. 30 capsule 0   ezetimibe (ZETIA) 10 MG tablet TAKE ONE TABLET BY MOUTH ONCE DAILY. 30 tablet 0   FARXIGA 5 MG TABS tablet Take 5 mg by mouth every morning.     LAMICTAL 150 MG tablet Take 1/2 tablet twice  a day 90 tablet 3   levETIRAcetam (KEPPRA XR) 750 MG 24 hr tablet Take 2 tablets twice a day 360 tablet 3   levothyroxine (SYNTHROID) 125 MCG tablet TAKE ONE TABLET BY MOUTH ONCE DAILY. 90 tablet 0   montelukast (SINGULAIR) 10 MG tablet TAKE (1) TABLET BY MOUTH AT BEDTIME. 30 tablet 0   omeprazole (PRILOSEC) 20 MG capsule TAKE ONE CAPSULE BY MOUTH ONCE DAILY. 30 capsule 0   potassium chloride (KLOR-CON) 10 MEQ tablet Take 10 mEq by mouth daily.     rosuvastatin (CRESTOR) 40 MG tablet TAKE 1 TABLET BY MOUTH ONCE A DAY. 30 tablet 0   UNABLE TO FIND HHS Bath chair Bath mat 1 each 0   No current facility-administered medications for this visit.     Past Surgical History:  Procedure Laterality Date   ABDOMINAL HYSTERECTOMY  2001   BREAST LUMPECTOMY  1996   right    COLONOSCOPY N/A 05/16/2016   Dr. Oneida Alar: Redundant colon, hemorrhoids, next colonoscopy 10 years   INGUINAL HERNIA REPAIR N/A 08/12/2019   Procedure: laparoscopic incisional hernia repair;  Surgeon: Lucas Mallow, MD;  Location: [facility];  Service: Urology;  Laterality: N/A;   ROBOTIC ASSITED PARTIAL NEPHRECTOMY Right 08/05/2019   Procedure: XI ROBOTIC ASSITED PARTIAL NEPHRECTOMY WITH INTRAOPERATIVE ULTRASOUND;  Surgeon: [name], MD;  Location: [facility];  Service: Urology;  Laterality: Right;  3 HRS   THYROIDECTOMY Bilateral 11/17/2012   Procedure: TOTAL THYROIDECTOMY;  Surgeon: Ascencion Dike, MD;  Location: [facility] OR;  Service: ENT;  Laterality: Bilateral;   TOTAL THYROIDECTOMY Bilateral 11/17/2012   TUBAL LIGATION       Allergies  Allergen Reactions   Simvastatin Other (See Comments)    Muscle aches      Family History  Problem Relation Age of Onset   Diabetes Mother    Colon cancer Neg Hx      Social History Ms. Kapur reports that she has never smoked. She has never used smokeless tobacco. Ms. Kareem reports no history of alcohol use.   Review of Systems CONSTITUTIONAL: No weight loss, fever, chills,  weakness or fatigue.  HEENT: Eyes: No visual loss, blurred vision, double vision or yellow sclerae.No hearing loss, sneezing, congestion, runny nose or sore throat.  SKIN: No rash or itching.  CARDIOVASCULAR: per hpi RESPIRATORY: No shortness of breath, cough or sputum.  GASTROINTESTINAL: No anorexia, nausea, vomiting or diarrhea. No abdominal pain or blood.  GENITOURINARY: No burning on urination, no polyuria NEUROLOGICAL: No headache, dizziness, syncope, paralysis, ataxia, numbness or tingling in the extremities. No change in bowel or bladder control.  MUSCULOSKELETAL: No muscle, back pain, joint pain or stiffness.  LYMPHATICS: No enlarged nodes. No history of splenectomy.  PSYCHIATRIC: No history of depression or anxiety.  ENDOCRINOLOGIC: No reports of sweating, cold or heat intolerance. No polyuria or polydipsia.  Marland Kitchen   Physical Examination Today's Vitals   02/06/22 1001 02/06/22 1018  BP: 138/84 117/70  Pulse: 77   SpO2: 98%   Weight: 241 lb (109.3 kg)   Height: '5\' 5"'$  (1.651 m)    Body mass index is 40.1 kg/m.  Gen: resting comfortably, no acute distress HEENT: no scleral icterus, pupils equal round and reactive, no palptable cervical adenopathy,  CV: RRR, no mr/g no jvd Resp: Clear to auscultation bilaterally GI: abdomen is soft, non-tender, non-distended, normal bowel sounds, no hepatosplenomegaly MSK: extremities are warm, no edema.  Skin: warm, no rash Neuro:  no focal deficits Psych: appropriate affect   Diagnostic Studies  07/2019 echo IMPRESSIONS     1. Hyperdynamic LV systolic function; elevated mean gradient across  aortic valve (19 mmHg) suggests mild AS; however hyperdynamic LV function  at least partially contributing; trace AI; mild TR with severely elevated  pulmonary pressure.   2. Left ventricular ejection fraction, by estimation, is >75%. The left  ventricle has hyperdynamic function. The left ventricle has no regional  wall motion abnormalities.  Left ventricular diastolic parameters were  normal.   3. Right ventricular systolic function is normal. The right ventricular  size is normal. There is severely elevated pulmonary artery systolic  pressure.   4. The mitral valve is normal in structure. Trivial mitral valve  regurgitation. No evidence of mitral stenosis.   5. The aortic valve is tricuspid. Aortic valve regurgitation is trivial.  Mild aortic valve stenosis.   6. The inferior vena cava is normal in size with greater than 50%  respiratory variability, suggesting right atrial pressure of 3 mmHg.     Assessment and Plan    1. PAF/acquired thrombophilia - mild infrequent symptoms, continue current meds including eliquis for stroke prevention  EKG todays shows NSR   2. HTN - at goal on manual receheck, continue current meds   3. Hyperlipidemia - at goal, continue current meds        Arnoldo Lenis, M.D.,

## 2022-02-10 ENCOUNTER — Other Ambulatory Visit: Payer: Self-pay | Admitting: Family Medicine

## 2022-02-20 ENCOUNTER — Ambulatory Visit
Admission: RE | Admit: 2022-02-20 | Discharge: 2022-02-20 | Disposition: A | Discharge status: Home / Self Care | Payer: Medicare Other | Source: Ambulatory Visit | Attending: Neurology | Admitting: Neurology

## 2022-02-20 DIAGNOSIS — G40009 Localization-related (focal) (partial) idiopathic epilepsy and epileptic syndromes with seizures of localized onset, not intractable, without status epilepticus: Secondary | ICD-10-CM

## 2022-02-20 MED ORDER — GADOPICLENOL 0.5 MMOL/ML IV SOLN
10.0000 mL | Freq: Once | INTRAVENOUS | Status: AC | PRN
  Administered 2022-02-20: 10 mL via INTRAVENOUS

## 2022-03-11 ENCOUNTER — Other Ambulatory Visit: Payer: Self-pay | Admitting: Family Medicine

## 2022-03-18 LAB — LIPID PANEL
Chol/HDL Ratio: 3 ratio (ref 0.0–4.4)
Cholesterol, Total: 145 mg/dL (ref 100–199)
HDL: 48 mg/dL (ref >39)
LDL Chol Calc (NIH): 85 mg/dL (ref 0–99)
Triglycerides: 59 mg/dL (ref 0–149)
VLDL Cholesterol Cal: 12 mg/dL (ref 5–40)

## 2022-03-18 LAB — CMP14+EGFR
ALT: 17 IU/L (ref 0–32)
AST: 18 IU/L (ref 0–40)
Albumin/Globulin Ratio: 1.3 (ref 1.2–2.2)
Albumin: 4.3 g/dL (ref 3.9–4.9)
Alkaline Phosphatase: 196 IU/L — ABNORMAL HIGH (ref 44–121)
BUN/Creatinine Ratio: 18 (ref 12–28)
BUN: 40 mg/dL — ABNORMAL HIGH (ref 8–27)
Bilirubin Total: 0.4 mg/dL (ref 0.0–1.2)
CO2: 19 mmol/L — ABNORMAL LOW (ref 20–29)
Calcium: 9.3 mg/dL (ref 8.7–10.3)
Chloride: 109 mmol/L — ABNORMAL HIGH (ref 96–106)
Creatinine, Ser: 2.21 mg/dL — ABNORMAL HIGH (ref 0.57–1.00)
Globulin, Total: 3.4 g/dL (ref 1.5–4.5)
Glucose: 90 mg/dL (ref 70–99)
Potassium: 4.9 mmol/L (ref 3.5–5.2)
Sodium: 144 mmol/L (ref 134–144)
Total Protein: 7.7 g/dL (ref 6.0–8.5)
eGFR: 24 mL/min/{1.73_m2} — ABNORMAL LOW (ref >59)

## 2022-03-18 LAB — VITAMIN D 25 HYDROXY (VIT D DEFICIENCY, FRACTURES): Vit D, 25-Hydroxy: 33.4 ng/mL (ref 30.0–100.0)

## 2022-04-01 ENCOUNTER — Other Ambulatory Visit: Payer: Self-pay | Admitting: Nurse Practitioner

## 2022-04-01 ENCOUNTER — Telehealth: Payer: Self-pay | Admitting: Nurse Practitioner

## 2022-04-01 DIAGNOSIS — E890 Postprocedural hypothyroidism: Secondary | ICD-10-CM

## 2022-04-01 DIAGNOSIS — C73 Malignant neoplasm of thyroid gland: Secondary | ICD-10-CM

## 2022-04-01 NOTE — Telephone Encounter (Signed)
Pt requested labs be updated for [facility] and will go tomorrow morning to do labs.

## 2022-04-01 NOTE — Telephone Encounter (Signed)
Done, I put in orders for thyroid labs to [facility]

## 2022-04-02 ENCOUNTER — Encounter: Payer: Self-pay | Admitting: Family Medicine

## 2022-04-02 ENCOUNTER — Ambulatory Visit (INDEPENDENT_AMBULATORY_CARE_PROVIDER_SITE_OTHER): Payer: Medicare Other | Admitting: Family Medicine

## 2022-04-02 VITALS — BP 124/74 | HR 81 | Ht 65.0 in | Wt 243.0 lb

## 2022-04-02 DIAGNOSIS — I482 Chronic atrial fibrillation, unspecified: Secondary | ICD-10-CM | POA: Diagnosis not present

## 2022-04-02 DIAGNOSIS — M1711 Unilateral primary osteoarthritis, right knee: Secondary | ICD-10-CM

## 2022-04-02 DIAGNOSIS — E890 Postprocedural hypothyroidism: Secondary | ICD-10-CM

## 2022-04-02 DIAGNOSIS — E785 Hyperlipidemia, unspecified: Secondary | ICD-10-CM

## 2022-04-02 DIAGNOSIS — I10 Essential (primary) hypertension: Secondary | ICD-10-CM | POA: Diagnosis not present

## 2022-04-02 DIAGNOSIS — I4891 Unspecified atrial fibrillation: Secondary | ICD-10-CM | POA: Diagnosis not present

## 2022-04-02 DIAGNOSIS — N1832 Chronic kidney disease, stage 3b: Secondary | ICD-10-CM

## 2022-04-02 NOTE — Patient Instructions (Signed)
F/U in 5 month, call if you need me sooner  X ray of  right knee in next 1 week please    Happy [name] this Saturday!  Need Covid vaccine and shingrix #2 vaccine , both are at your pharmacy   You are referred to Cardiology  Work on weight loss, take tylenol if needed for knee pain  Thanks for choosing [facility], we consider it a privelige to serve you.

## 2022-04-02 NOTE — Assessment & Plan Note (Signed)
Right knee stiffness and pain since 03/02/2022, no acute injury , no instability

## 2022-04-03 ENCOUNTER — Encounter: Payer: Self-pay | Admitting: Nurse Practitioner

## 2022-04-03 ENCOUNTER — Ambulatory Visit (INDEPENDENT_AMBULATORY_CARE_PROVIDER_SITE_OTHER): Payer: Medicare Other | Admitting: Nurse Practitioner

## 2022-04-03 DIAGNOSIS — C73 Malignant neoplasm of thyroid gland: Secondary | ICD-10-CM

## 2022-04-03 DIAGNOSIS — E890 Postprocedural hypothyroidism: Secondary | ICD-10-CM | POA: Diagnosis not present

## 2022-04-03 LAB — T4, FREE: Free T4: 1.4 ng/dL (ref 0.8–1.8)

## 2022-04-03 LAB — TSH: TSH: 0.08 mIU/L — ABNORMAL LOW (ref 0.40–4.50)

## 2022-04-03 MED ORDER — LEVOTHYROXINE SODIUM 125 MCG PO TABS: 125.0000 ug | ORAL_TABLET | Freq: Every day | ORAL | 3 refills | Status: DC

## 2022-04-03 NOTE — Patient Instructions (Signed)

## 2022-04-03 NOTE — Progress Notes (Signed)
04/03/2022      Endocrinology follow-up note     Subjective:    Patient ID: [name], female    DOB: 1955-02-27,    Past Medical History:  Diagnosis Date   A-fib ([place])    Anemia    Depression    Dyspnea    Fractures    Hyperlipidemia    Hypertension    Mental retardation, mild (I.Q. 50-70)    lives alone and cares for self has a nurse aide comes once a day    Obesity    Pneumonia    PONV (postoperative nausea and vomiting)    Seizure ([place])    11/17/2012 "still having them; "I believe their from a tumor in my head" ((11/17/2012   Seizure disorder ([place])    Thyroid cancer [facility])    Past Surgical History:  Procedure Laterality Date   ABDOMINAL HYSTERECTOMY  2001   BREAST LUMPECTOMY  1996   right    COLONOSCOPY N/A 05/16/2016   Dr. Oneida Alar: Redundant colon, hemorrhoids, next colonoscopy 10 years   INGUINAL HERNIA REPAIR N/A 08/12/2019   Procedure: laparoscopic incisional hernia repair;  Surgeon: Lucas Mallow, MD;  Location: [facility];  Service: Urology;  Laterality: N/A;   ROBOTIC ASSITED PARTIAL NEPHRECTOMY Right 08/05/2019   Procedure: XI ROBOTIC ASSITED PARTIAL NEPHRECTOMY WITH INTRAOPERATIVE ULTRASOUND;  Surgeon: [name], MD;  Location: [facility];  Service: Urology;  Laterality: Right;  3 HRS   THYROIDECTOMY Bilateral 11/17/2012   Procedure: TOTAL THYROIDECTOMY;  Surgeon: Ascencion Dike, MD;  Location: [facility] OR;  Service: ENT;  Laterality: Bilateral;   TOTAL THYROIDECTOMY Bilateral 11/17/2012   TUBAL LIGATION     Social History   Socioeconomic History   Marital status: Single    Spouse name: Not on file   Number of children: 2   Years of education: Not on file   Highest education level: Not on file  Occupational History   Occupation: disabled   Tobacco Use   Smoking status: Never   Smokeless tobacco: Never  Vaping Use   Vaping Use: Never used  Substance and Sexual Activity   Alcohol use: No   Drug use: No   Sexual activity: Never  Other Topics Concern    Not on file  Social History Narrative   Right handed   Caffeine intake yes   Retired   Lives alone   Social Determinants of Health   Financial Resource Strain: [place]  (06/13/2020)   Overall Financial Resource Strain (CARDIA)    Difficulty of Paying Living Expenses: Not hard at all  Food Insecurity: No [place] (06/13/2020)   Hunger Vital Sign    Worried About Running Out of Food in the Last Year: Never true    [place] in the Last Year: Never true  Transportation Needs: No Transportation Needs (06/13/2020)   PRAPARE - Hydrologist (Medical): No    Lack of Transportation (Non-Medical): No  Physical Activity: Sufficiently Active (06/13/2020)   Exercise Vital Sign    Days of Exercise per Week: 7 days    Minutes of Exercise per Session: 60 min  Stress: No Stress Concern Present (06/13/2020)   [place]    Feeling of Stress : Not at all  Social Connections: Socially Isolated (06/13/2020)   Social Connection and Isolation Panel [NHANES]    Frequency of Communication with Friends and Family: More than three times  a week    Frequency of Social Gatherings with Friends and Family: Twice a week    Attends Religious Services: Never    Marine scientist or Organizations: No    Attends Archivist Meetings: Never    Marital Status: Never married   Outpatient Encounter Medications as of 04/03/2022  Medication Sig   acetaminophen (TYLENOL) 500 MG tablet Take 1 tablet (500 mg total) by mouth 2 (two) times daily. (Patient taking differently: Take 500 mg by mouth 2 (two) times daily as needed for mild pain or headache.)   apixaban (ELIQUIS) 5 MG TABS tablet Take 1 tablet (5 mg total) by mouth 2 (two) times daily.   benazepril (LOTENSIN) 20 MG tablet TAKE (1) TABLET BY MOUTH ONCE DAILY. (Patient taking differently: Take 20 mg by mouth daily.)   diltiazem (CARDIZEM CD) 180 MG  24 hr capsule TAKE 1 CAPSULE BY MOUTH ONCE DAILY.   ezetimibe (ZETIA) 10 MG tablet TAKE ONE TABLET BY MOUTH ONCE DAILY.   FARXIGA 5 MG TABS tablet Take 5 mg by mouth every morning.   LAMICTAL 150 MG tablet Take 1/2 tablet twice a day   levETIRAcetam (KEPPRA XR) 750 MG 24 hr tablet Take 2 tablets twice a day   montelukast (SINGULAIR) 10 MG tablet TAKE (1) TABLET BY MOUTH AT BEDTIME.   omeprazole (PRILOSEC) 20 MG capsule TAKE ONE CAPSULE BY MOUTH ONCE DAILY.   potassium chloride (KLOR-CON) 10 MEQ tablet Take 10 mEq by mouth daily.   rosuvastatin (CRESTOR) 40 MG tablet TAKE 1 TABLET BY MOUTH ONCE A DAY.   UNABLE TO FIND HHS Bath chair Bath mat   [DISCONTINUED] levothyroxine (SYNTHROID) 125 MCG tablet TAKE ONE TABLET BY MOUTH ONCE DAILY.   levothyroxine (SYNTHROID) 125 MCG tablet Take 1 tablet (125 mcg total) by mouth daily.   No facility-administered encounter medications on file as of 04/03/2022.   ALLERGIES: Allergies  Allergen Reactions   Simvastatin Other (See Comments)    Muscle aches   VACCINATION STATUS: Immunization History  Administered Date(s) Administered   Fluad Quad(high Dose 65+) 11/29/2019, 11/29/2020, 10/31/2021   H1N1 12/16/2007   Influenza Split 11/24/2011   Influenza Whole 11/30/2006, 12/07/2008, 12/26/2009   Influenza,inj,Quad PF,6+ Mos 11/18/2012, 12/06/2013, 05/23/2015, 01/29/2016, 12/01/2016, 01/18/2018, 12/01/2018   Pneumococcal Conjugate-13 04/03/2014   Pneumococcal Polysaccharide-23 05/10/2019   Td 08/22/2003   Tdap 04/02/2016   Unspecified SARS-COV-2 Vaccination 05/30/2019, 04/20/2020   Zoster Recombinat (Shingrix) 12/05/2020    HPI  67 year old female patient with medical history as above.  She is here to follow-up for post surgical hypothyroidism and for follow-up of Papillary thyroid cancer.  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 She has had thyrogen stimulated remnant ablation with negative post therapy WBS in 2014.  - Second Thyrogen Stimulated whole body scan for surveillance shows no scintigraphic evidence of iodine avid metastasis papillary thyroid cancer on 04/18/2016.  - Most recent surveillance imaging with Thyrogen stimulated whole-body scan on September 27, 2018 showed no evidence of tumor recurrence.  -Her ultrasound from November 07, 2019 was consistent with surgical changes with no new sign of recurrence or residual tumor.  Her last thyroid imaging on November 07, 2019 also confirmed stable atrophic heterogenous residual right thyroid lobe measuring 2.5 cm x 1.2 cm.  Stability documented since 2019.   She is now on Synthroid 125 mcg p.o. daily before breakfast.  -She has  steady weight since last visit.  She has no new complaints.    She is going out of town to celebrate her birthday this weekend!   Review of systems  Constitutional: + Minimally fluctuating body weight,  current Body mass index is 40.24 kg/m. , no fatigue, no subjective hyperthermia, no subjective hypothermia Eyes: no blurry vision, no xerophthalmia ENT: no sore throat, no nodules palpated in throat, no dysphagia/odynophagia, no hoarseness Cardiovascular: no chest pain, no shortness of breath, no palpitations, no leg swelling Respiratory: no cough, no shortness of breath Gastrointestinal: no nausea/vomiting/diarrhea Musculoskeletal: no muscle/joint aches Skin: no rashes, no hyperemia Neurological: no tremors, no numbness, no tingling, no dizziness Psychiatric: no depression, no anxiety  Objective:    BP 136/75 (BP Location: Left Arm, Patient Position: Sitting, Cuff Size: Large)   Pulse 88   Ht '5\' 5"'$  (1.651 m)   Wt 241 lb 12.8 oz (109.7 kg)   BMI 40.24 kg/m   Wt Readings from Last 3 Encounters:  04/03/22 241 lb 12.8 oz (109.7 kg)  04/02/22 243 lb 0.6 oz (110.2 kg)  02/06/22 241 lb (109.3 kg)    BP Readings from  Last 3 Encounters:  04/03/22 136/75  04/02/22 124/74  02/06/22 117/70    Physical Exam- Limited  Constitutional:  Body mass index is 40.24 kg/m. , not in acute distress, normal state of mind Eyes:  EOMI, no exophthalmos Neck: Supple Thyroid: scar from previous thyroidectomy Musculoskeletal: no gross deformities, strength intact in all four extremities, no gross restriction of joint movements Skin:  no rashes, no hyperemia Neurological: no tremor with outstretched hands   Results for orders placed or performed in visit on 04/01/22  TSH  Result Value Ref Range   TSH 0.08 (L) 0.40 - 4.50 mIU/L  T4, free  Result Value Ref Range   Free T4 1.4 0.8 - 1.8 ng/dL   Complete Blood Count (Most recent): Lab Results  Component Value Date   WBC 5.0 08/27/2021   HGB 11.0 (L) 08/27/2021   HCT 35.5 (L) 08/27/2021   MCV 89.0 08/27/2021   PLT 178 08/27/2021   Diabetic Labs (most recent): Lab Results  Component Value Date   HGBA1C 5.2 05/26/2017   HGBA1C 5.3 08/10/2012   Lipid Panel     Component Value Date/Time   CHOL 145 03/17/2022 0954   TRIG 59 03/17/2022 0954   HDL 48 03/17/2022 0954   CHOLHDL 3.0 03/17/2022 0954   CHOLHDL 3.3 10/06/2019 0951   VLDL 14 01/29/2016 1109   LDLCALC 85 03/17/2022 0954   LDLCALC 80 10/06/2019 0951    June 01, 2017  IMPRESSION:  Questioned approximately 2.2 cm soft tissue within the right lobectomy resection bed appears similar to the 07/2015 examination.   Note, while nonspecific, this tissue was apparently present at the time of the Thyrogen stimulated I 131 nuclear medicine whole body scan performed 03/2016 which was negative for the presence of iodine avid metastatic thyroid cancer or residual disease, and thus favored to be benign etiology. Clinical correlation is advised.  Thyrogen stimulated whole-body scan September 27, 2018 fINDINGS:  Excreted tracer within colon and urinary bladder. Small amount of retained gastric activity. Pattern  is similar to that seen on the previous exam.   No sites of iodine-avid metastatic thyroid cancer identified. No residual tracer accumulation in the neck.   IMPRESSION: No scintigraphic evidence of iodine-avid metastatic thyroid cancer.     Ultrasound of thyroid on November 07, 2019 -Right lobe 2.5 x 1.2 x 0.9  cm, left lobe surgically absent. Stable atrophic heterogeneous residual right thyroid lobe as before. No hypervascularity. No left thyroid bed abnormality. No regional adenopathy.   IMPRESSION: Stable thyroid ultrasound compared 2019 as above.   Latest Reference Range & Units 09/12/20 10:02 03/22/21 08:30 09/24/21 09:22 04/02/22 11:21  TSH 0.40 - 4.50 mIU/L 0.356 (L) 0.200 (L) 0.071 (L) 0.08 (L)  T4,Free(Direct) 0.8 - 1.8 ng/dL 1.49 1.87 (H) 1.94 (H) 1.4  (L): Data is abnormally low (H): Data is abnormally high Assessment & Plan:   1. Hypothyroidism, postsurgical -Her previsit thyroid function tests are consistent with appropriate hormone replacement.  TSH is slightly suppressed but given her history of thyroid cancer, her thyroid should be kept slightly over-active.  Therefore she will be kept on same dose of thyroid medication for now at Levothyroxine 125 mcg po daily before breakfast.    - We discussed about the correct intake of her thyroid hormone, on empty stomach at fasting, with water, separated by at least 30 minutes from breakfast and other medications,  and separated by more than 4 hours from calcium, iron, multivitamins, acid reflux medications (PPIs). -Patient is made aware of the fact that thyroid hormone replacement is needed for life, dose to be adjusted by periodic monitoring of thyroid function tests.  - Target TSH for her would be between 0.1-0.5.  2. Papillary thyroid carcinoma ([place]) Her surveillance neck /thyroid u/s from 11/17/2013 was c/w surgically absent thyroid, however there is a 0.9 cm left cervical lymph node. See below.  Pt is s/p thyrogen  stimulated thyroid remnant ablation with WBS on 01/14/2013 with no evidence of iodine avid metastasis. She has had pT2,Nx,Mx FVPTC, s/p near total thyroidectomy on September 24,2014. She is counseled to maintain high degree of compliance with synthroid therapy with aim of keeping TSH low normal ( 0.1-0.5).  -she is advised on the necessity of follow-ups and imaging studies at least one annually for the next 5 years. - She was sent for core biopsy of 0.9 cm left cervical lymph node last visit. However, this procedure was abandoned due to " no evidence of adenopathy".  The previously noticed  left cervical lymph node was sent to decrease in size to 0.5 cm. Medicaid did not cover surveillance ultrasound. - Second Thyrogen estimated or body scan for surveillance shows no scintigraphic evidence of iodine avid metastasis papillary thyroid cancer on 04/18/2016. -June 01, 2017 surveillance thyroid/neck ultrasound : Unremarkable, see above.   -She underwent  Thyrogen stimulated whole body scan which is negative for tumor recurrence.   Her recent thyroid/neck ultrasound from September 2021 showed stable atrophic heterogenous residual thyroid lobe measuring 2.5 x1.2 cm observed to have been stable since 2019.  She will not need any immediate intervention for this at this time.   She had follow up surveillance thyroid ultrasound prior to today's visit which shows no areas of concern.    She is advised to keep close follow-up with Dr. Tula Nakayama for primary care needs.    I spent  15  minutes in the care of the patient today including review of labs from Thyroid Function, CMP, and other relevant labs ; imaging/biopsy records (current and previous including abstractions from other facilities); face-to-face time discussing  her lab results and symptoms, medications doses, her options of short and long term treatment based on the latest standards of care / guidelines;   and documenting the  encounter.  Delrae Alfred Yurko  participated in the discussions, expressed understanding, and voiced agreement  with the above plans.  All questions were answered to her satisfaction. she is encouraged to contact clinic should she have any questions or concerns prior to her return visit.   Follow up plan: Return in about 6 months (around 10/02/2022) for Thyroid follow up, Previsit labs.  Rayetta Pigg, [facility] [address] [place], [place] [address] Phone: [phone] Fax: [phone]   04/03/2022, 10:06 AM

## 2022-04-07 ENCOUNTER — Encounter: Payer: Self-pay | Admitting: Nurse Practitioner

## 2022-04-07 ENCOUNTER — Ambulatory Visit: Payer: Medicare Other | Attending: Nurse Practitioner | Admitting: Nurse Practitioner

## 2022-04-07 VITALS — BP 100/60 | HR 94 | Ht 65.0 in | Wt 242.6 lb

## 2022-04-07 DIAGNOSIS — I480 Paroxysmal atrial fibrillation: Secondary | ICD-10-CM | POA: Insufficient documentation

## 2022-04-07 DIAGNOSIS — N184 Chronic kidney disease, stage 4 (severe): Secondary | ICD-10-CM | POA: Insufficient documentation

## 2022-04-07 DIAGNOSIS — I10 Essential (primary) hypertension: Secondary | ICD-10-CM | POA: Diagnosis present

## 2022-04-07 DIAGNOSIS — E785 Hyperlipidemia, unspecified: Secondary | ICD-10-CM | POA: Insufficient documentation

## 2022-04-07 MED ORDER — BENAZEPRIL HCL 20 MG PO TABS: 20.0000 mg | ORAL_TABLET | Freq: Every day | ORAL | 5 refills | Status: DC

## 2022-04-07 MED ORDER — MONTELUKAST SODIUM 10 MG PO TABS: ORAL_TABLET | ORAL | 5 refills | Status: DC

## 2022-04-07 MED ORDER — DILTIAZEM HCL ER COATED BEADS 180 MG PO CP24: 180.0000 mg | ORAL_CAPSULE | Freq: Every day | ORAL | 5 refills | Status: DC

## 2022-04-07 MED ORDER — ROSUVASTATIN CALCIUM 40 MG PO TABS: 40.0000 mg | ORAL_TABLET | Freq: Every day | ORAL | 5 refills | Status: DC

## 2022-04-07 MED ORDER — EZETIMIBE 10 MG PO TABS: 10.0000 mg | ORAL_TABLET | Freq: Every day | ORAL | 5 refills | Status: DC

## 2022-04-07 NOTE — Assessment & Plan Note (Signed)
  Patient re-educated about  the importance of commitment to a  minimum of 150 minutes of exercise per week as able.  The importance of healthy food choices with portion control discussed, as well as eating regularly and within a 12 hour window most days. The need to choose "clean , green" food 50 to 75% of the time is discussed, as well as to make water the primary drink and set a goal of 64 ounces water daily.       04/03/2022    9:59 AM 04/02/2022   10:01 AM 02/06/2022   10:01 AM  Weight /BMI  Weight 241 lb 12.8 oz 243 lb 0.6 oz 241 lb  Height '5\' 5"'$  (1.651 m) '5\' 5"'$  (1.651 m) '5\' 5"'$  (1.651 m)  BMI 40.24 kg/m2 40.44 kg/m2 40.1 kg/m2

## 2022-04-07 NOTE — Patient Instructions (Signed)

## 2022-04-07 NOTE — Assessment & Plan Note (Signed)
Stable followed by Nephrology

## 2022-04-07 NOTE — Assessment & Plan Note (Signed)
Controlled on medication, managed by Endo

## 2022-04-07 NOTE — Assessment & Plan Note (Signed)
Rate controlled, however needs cardiuology follow up and management, refer for same

## 2022-04-07 NOTE — Assessment & Plan Note (Signed)
Hyperlipidemia:Low fat diet discussed and encouraged.   Lipid Panel  Lab Results  Component Value Date   CHOL 145 03/17/2022   HDL 48 03/17/2022   LDLCALC 85 03/17/2022   TRIG 59 03/17/2022   CHOLHDL 3.0 03/17/2022     Controlled, no change in medication

## 2022-04-07 NOTE — Progress Notes (Signed)
[name]     MRN: [id number]      DOB: [birth year]   HPI [name] is here for follow up and re-evaluation of chronic medical conditions, medication management and review of any available recent lab and radiology data.  Preventive health is updated, specifically  Cancer screening and Immunization.   Questions or concerns regarding consultations or procedures which the PT has had in the interim are  addressed. The PT denies any adverse reactions to current medications since the last visit.  There are no new concerns.  There are no specific complaints   ROS Denies recent fever or chills. Denies sinus pressure, nasal congestion, ear pain or sore throat. Denies chest congestion, productive cough or wheezing. Denies chest pains, palpitations and leg swelling Denies abdominal pain, nausea, vomiting,diarrhea or constipation.   Denies dysuria, frequency, hesitancy or incontinence. Denies joint pain, swelling and limitation in mobility. Denies headaches, seizures, numbness, or tingling. Denies depression, anxiety or insomnia. Denies skin break down or rash.   PE  BP 124/74 (BP Location: Right Arm, Patient Position: Sitting, Cuff Size: Large)   Pulse 81   Ht 5' 5"$  (1.651 m)   Wt 243 lb 0.6 oz (110.2 kg)   SpO2 93%   BMI 40.44 kg/m   Patient alert and oriented and in no cardiopulmonary distress.  HEENT: No facial asymmetry, EOMI,     Neck supple .  Chest: Clear to auscultation bilaterally.  CVS: S1, S2 no murmurs, no S3.Regular rate.  ABD: Soft non tender.   Ext: No edema  MS: Adequate ROM spine, shoulders, hips and reduced in right knee.  Skin: Intact, no ulcerations or rash noted.  Psych: Good eye contact, normal affect. Memory intact not anxious or depressed appearing.  CNS: CN 2-12 intact, power,  normal throughout.no focal deficits noted.   Assessment & Plan  Osteoarthritis of right knee Right knee stiffness and pain since 03/02/2022, no acute injury , no  instability  Atrial fibrillation with RVR (HCC) Rate controlled, however needs cardiuology follow up and management, refer for same  Essential hypertension Controlled, no change in medication DASH diet and commitment to daily physical activity for a minimum of 30 minutes discussed and encouraged, as a part of hypertension management. The importance of attaining a healthy weight is also discussed.     04/03/2022    9:59 AM 04/02/2022   10:01 AM 02/06/2022   10:18 AM 02/06/2022   10:01 AM 01/22/2022   10:26 AM 10/31/2021   11:04 AM 10/31/2021   10:54 AM  BP/Weight  Systolic BP [id number] 0000000 [id number]  Diastolic BP 75 74 70 84 85 80 70  Wt. (Lbs) 241.8 243.04  241 241  240.12  BMI 40.24 kg/m2 40.44 kg/m2  40.1 kg/m2 40.1 kg/m2  39.96 kg/m2       Hypothyroidism, postsurgical Controlled on medication, managed by Endo  CKD (chronic kidney disease) stage 3, GFR 30-59 ml/min Stable followed by Nephrology  Morbid obesity [facility])  Patient re-educated about  the importance of commitment to a  minimum of 150 minutes of exercise per week as able.  The importance of healthy food choices with portion control discussed, as well as eating regularly and within a 12 hour window most days. The need to choose "clean , green" food 50 to 75% of the time is discussed, as well as to make water the primary drink and set a goal of 64 ounces water daily.  04/03/2022    9:59 AM 04/02/2022   10:01 AM 02/06/2022   10:01 AM  Weight /BMI  Weight 241 lb 12.8 oz 243 lb 0.6 oz 241 lb  Height 5' 5"$  (1.651 m) 5' 5"$  (1.651 m) 5' 5"$  (1.651 m)  BMI 40.24 kg/m2 40.44 kg/m2 40.1 kg/m2      Hyperlipidemia LDL goal <100 Hyperlipidemia:Low fat diet discussed and encouraged.   Lipid Panel  Lab Results  Component Value Date   CHOL 145 03/17/2022   HDL 48 03/17/2022   LDLCALC 85 03/17/2022   TRIG 59 03/17/2022   CHOLHDL 3.0 03/17/2022     Controlled, no change in medication

## 2022-04-07 NOTE — Assessment & Plan Note (Signed)
Controlled, no change in medication DASH diet and commitment to daily physical activity for a minimum of 30 minutes discussed and encouraged, as a part of hypertension management. The importance of attaining a healthy weight is also discussed.     04/03/2022    9:59 AM 04/02/2022   10:01 AM 02/06/2022   10:18 AM 02/06/2022   10:01 AM 01/22/2022   10:26 AM 10/31/2021   11:04 AM 10/31/2021   10:54 AM  BP/Weight  Systolic BP [id number] 0000000 [id number]  Diastolic BP 75 74 70 84 85 80 70  Wt. (Lbs) 241.8 243.04  241 241  240.12  BMI 40.24 kg/m2 40.44 kg/m2  40.1 kg/m2 40.1 kg/m2  39.96 kg/m2

## 2022-04-07 NOTE — Progress Notes (Signed)
Cardiology Office Note:    Date:  04/07/2022   ID:  Christine, Huffman 04/11/1955, MRN [id number]  PCP:  [name], MD   [facility] Cardiologist:  Carlyle Dolly, MD     Referring MD: [name], MD   CC: Referred from PCP's office  History of Present Illness:    [name] is a 67 y.o. female with a hx of the following:  PAF HTN HLD Chronic kidney disease stage IV (follows Dr. Theador Hawthorne) History of thyroid cancer Obesity  Patient is a 67 year old female with past medical history as mentioned above.  Paroxysmal A-fib appear to be new diagnosis in 2021 during admission with small bowel obstruction.  Last seen by Dr. Carlyle Dolly on February 06, 2022.  At that time, she reported infrequent palpitations and was compliant with her medications.  Denied any bleeding issues while on Eliquis.  EKG in office showed normal sinus rhythm.  No medication changes were made.  Was told to follow-up in 1 year.   She presents today at the request of her PCP, Dr. Tula Nakayama. Pt states "my doctor wanted me to get checked out."  I have reviewed with her the last visit with Dr. Carlyle Dolly around 2 months ago.  Patient states she continues wanted to be evaluated today.  She denies any acute cardiac concerns or complaints.  No changes since she was last seen by Dr. Harl Bowie. Denies any chest pain, shortness of breath, palpitations, syncope, presyncope, dizziness, orthopnea, PND, swelling or significant weight changes, acute bleeding, or claudication.  Tolerating her medications well.  SH: She enjoys walking regularly and watching Family Feud on TV.   Past Medical History:  Diagnosis Date   A-fib ([place])    Anemia    Depression    Dyspnea    Fractures    Hyperlipidemia    Hypertension    Mental retardation, mild (I.Q. 50-70)    lives alone and cares for self has a nurse aide comes once a day    Obesity    Pneumonia    PONV (postoperative  nausea and vomiting)    Seizure ([place])    11/17/2012 "still having them; "I believe their from a tumor in my head" ((11/17/2012   Seizure disorder ([place])    Thyroid cancer [facility])     Past Surgical History:  Procedure Laterality Date   ABDOMINAL HYSTERECTOMY  2001   BREAST LUMPECTOMY  1996   right    COLONOSCOPY N/A 05/16/2016   Dr. Oneida Alar: Redundant colon, hemorrhoids, next colonoscopy 10 years   INGUINAL HERNIA REPAIR N/A 08/12/2019   Procedure: laparoscopic incisional hernia repair;  Surgeon: Lucas Mallow, MD;  Location: [facility] ORS;  Service: Urology;  Laterality: N/A;   ROBOTIC ASSITED PARTIAL NEPHRECTOMY Right 08/05/2019   Procedure: XI ROBOTIC ASSITED PARTIAL NEPHRECTOMY WITH INTRAOPERATIVE ULTRASOUND;  Surgeon: [name], MD;  Location: [facility];  Service: Urology;  Laterality: Right;  3 HRS   THYROIDECTOMY Bilateral 11/17/2012   Procedure: TOTAL THYROIDECTOMY;  Surgeon: Ascencion Dike, MD;  Location: [facility] OR;  Service: ENT;  Laterality: Bilateral;   TOTAL THYROIDECTOMY Bilateral 11/17/2012   TUBAL LIGATION      Current Medications: Current Meds  Medication Sig   acetaminophen (TYLENOL) 500 MG tablet Take 1 tablet (500 mg total) by mouth 2 (two) times daily. (Patient taking differently: Take 500 mg by mouth 2 (two) times daily as needed for mild pain or headache.)   apixaban (ELIQUIS)  5 MG TABS tablet Take 1 tablet (5 mg total) by mouth 2 (two) times daily.   benazepril (LOTENSIN) 20 MG tablet Take 1 tablet (20 mg total) by mouth daily.   diltiazem (CARDIZEM CD) 180 MG 24 hr capsule Take 1 capsule (180 mg total) by mouth daily.   ezetimibe (ZETIA) 10 MG tablet Take 1 tablet (10 mg total) by mouth daily.   FARXIGA 5 MG TABS tablet Take 5 mg by mouth every morning.   LAMICTAL 150 MG tablet Take 1/2 tablet twice a day   levETIRAcetam (KEPPRA XR) 750 MG 24 hr tablet Take 2 tablets twice a day   levothyroxine (SYNTHROID) 125 MCG tablet Take 1 tablet (125 mcg total) by mouth daily.    montelukast (SINGULAIR) 10 MG tablet TAKE (1) TABLET BY MOUTH AT BEDTIME.   omeprazole (PRILOSEC) 20 MG capsule TAKE ONE CAPSULE BY MOUTH ONCE DAILY.   potassium chloride (KLOR-CON) 10 MEQ tablet Take 10 mEq by mouth daily.   rosuvastatin (CRESTOR) 40 MG tablet Take 1 tablet (40 mg total) by mouth daily.   UNABLE TO FIND [facility] Bath chair Bath mat     Allergies:   Simvastatin   Social History   Socioeconomic History   Marital status: Single    Spouse name: Not on file   Number of children: 2   Years of education: Not on file   Highest education level: Not on file  Occupational History   Occupation: disabled   Tobacco Use   Smoking status: Never   Smokeless tobacco: Never  Vaping Use   Vaping Use: Never used  Substance and Sexual Activity   Alcohol use: No   Drug use: No   Sexual activity: Never  Other Topics Concern   Not on file  Social History Narrative   Right handed   Caffeine intake yes   Retired   Lives alone   Social Determinants of Health   Financial Resource Strain: [place]  (06/13/2020)   Overall Financial Resource Strain (CARDIA)    Difficulty of Paying Living Expenses: Not hard at all  Food Insecurity: No [place] (06/13/2020)   Hunger Vital Sign    Worried About Running Out of Food in the Last Year: Never true    [place] in the Last Year: Never true  Transportation Needs: No Transportation Needs (06/13/2020)   PRAPARE - Hydrologist (Medical): No    Lack of Transportation (Non-Medical): No  Physical Activity: Sufficiently Active (06/13/2020)   Exercise Vital Sign    Days of Exercise per Week: 7 days    Minutes of Exercise per Session: 60 min  Stress: No Stress Concern Present (06/13/2020)   [place]    Feeling of Stress : Not at all  Social Connections: Socially Isolated (06/13/2020)   Social Connection and Isolation Panel [NHANES]     Frequency of Communication with Friends and Family: More than three times a week    Frequency of Social Gatherings with Friends and Family: Twice a week    Attends Religious Services: Never    Marine scientist or Organizations: No    Attends Music therapist: Never    Marital Status: Never married     Family History: The patient's family history includes Diabetes in her mother. There is no history of Colon cancer.  ROS:   Please see the history of present illness.  All other systems reviewed and are negative.  EKGs/Labs/Other Studies Reviewed:    The following studies were reviewed today:   EKG:  EKG is  ordered today.  The ekg ordered today demonstrates normal sinus rhythm, 72 bpm, no acute ischemic changes.  Echocardiogram on August 15, 2019:  1. Hyperdynamic LV systolic function; elevated mean gradient across  aortic valve (19 mmHg) suggests mild AS; however hyperdynamic LV function  at least partially contributing; trace AI; mild TR with severely elevated  pulmonary pressure.   2. Left ventricular ejection fraction, by estimation, is >75%. The left  ventricle has hyperdynamic function. The left ventricle has no regional  wall motion abnormalities. Left ventricular diastolic parameters were  normal.   3. Right ventricular systolic function is normal. The right ventricular  size is normal. There is severely elevated pulmonary artery systolic  pressure.   4. The mitral valve is normal in structure. Trivial mitral valve  regurgitation. No evidence of mitral stenosis.   5. The aortic valve is tricuspid. Aortic valve regurgitation is trivial.  Mild aortic valve stenosis.   6. The inferior vena cava is normal in size with greater than 50%  respiratory variability, suggesting right atrial pressure of 3 mmHg.   Recent Labs: 08/27/2021: Hemoglobin 11.0; Platelets 178 03/17/2022: ALT 17; BUN 40; Creatinine, Ser 2.21; Potassium 4.9; Sodium 144 04/02/2022: TSH 0.08   Recent Lipid Panel    Component Value Date/Time   CHOL 145 03/17/2022 0954   TRIG 59 03/17/2022 0954   HDL 48 03/17/2022 0954   CHOLHDL 3.0 03/17/2022 0954   CHOLHDL 3.3 10/06/2019 0951   VLDL 14 01/29/2016 1109   LDLCALC 85 03/17/2022 0954   LDLCALC 80 10/06/2019 0951     Risk Assessment/Calculations:    CHA2DS2-VASc Score = 3  This indicates a 3.2% annual risk of stroke. The patient's score is based upon: CHF History: 0 HTN History: 1 Diabetes History: 0 Stroke History: 0 Vascular Disease History: 0 Age Score: 1 Gender Score: 1   The 10-year ASCVD risk score (Arnett DK, et al., 2019) is: 4.9%   Values used to calculate the score:     Age: 1 years     Sex: Female     Is Non-Hispanic African American: Yes     Diabetic: No     Tobacco smoker: No     Systolic Blood Pressure: 123XX123 mmHg     Is BP treated: Yes     HDL Cholesterol: 48 mg/dL     Total Cholesterol: 145 mg/dL   Physical Exam:    VS:  BP 100/60   Pulse 94   Ht 5' 5"$  (1.651 m)   Wt 242 lb 9.6 oz (110 kg)   SpO2 96%   BMI 40.37 kg/m     Wt Readings from Last 3 Encounters:  04/07/22 242 lb 9.6 oz (110 kg)  04/03/22 241 lb 12.8 oz (109.7 kg)  04/02/22 243 lb 0.6 oz (110.2 kg)     GEN: Morbidly obese, 67 y.o. female in no acute distress HEENT: Normal NECK: No JVD; No carotid bruits CARDIAC: S1/S2, RRR, no murmurs, rubs, gallops; 2+ pulses RESPIRATORY:  Clear to auscultation without rales, wheezing or rhonchi  MUSCULOSKELETAL:  No edema; No deformity  SKIN: Warm and dry NEUROLOGIC:  Alert and oriented x 3 PSYCHIATRIC:  Normal affect   ASSESSMENT:    1. PAF (paroxysmal atrial fibrillation) ([place])   2. Essential hypertension, benign   3. Hyperlipidemia, unspecified hyperlipidemia type   4. Morbid  obesity ([place])   5. CKD (chronic kidney disease) stage 4, GFR 15-29 ml/min (HCC)    PLAN:    In order of problems listed above:  PAF Denies any tachycardia or palpitations.  EKG today shows normal  sinus rhythm, heart rate 72 bpm.  CHA2DS2-VASc score 3.  Continue diltiazem and Eliquis.  She is on appropriate dosage of Eliquis, and denies any bleeding issues. Heart healthy diet and regular cardiovascular exercise encouraged.   HTN Blood pressure low normal today.  Denies any symptoms.  Continue current medication regimen. Heart healthy diet and regular cardiovascular exercise encouraged. Continue to follow with PCP.  HLD Recent lipid panel was unremarkable.  LDL 85.  Continue Zetia and rosuvastatin. Heart healthy diet and regular cardiovascular exercise encouraged.   Morbid Obesity BMI today 40.37. Weight loss via diet and exercise encouraged. Discussed the impact being overweight would have on cardiovascular risk. Heart healthy diet and regular cardiovascular exercise encouraged.   5. CKD stage IV Recent serum creatinine was 2.21, eGFR 24.  Avoid nephrotoxic agents.  Continue to follow-up with Dr. Theador Hawthorne and PCP.  6.  Disposition: Follow-up with Dr. Carlyle Dolly or APP in 1 year or sooner if anything changes.  Medication Adjustments/Labs and Tests Ordered: Current medicines are reviewed at length with the patient today.  Concerns regarding medicines are outlined above.  Orders Placed This Encounter  Procedures   EKG 12-Lead   No orders of the defined types were placed in this encounter.   Patient Instructions  Medication Instructions:  Continue all current medications.     Labwork: none  Testing/Procedures: none  Follow-Up: Your physician wants you to follow up in:  1 year.  You should receive a call from the office when due.  If you don't receive this call, please call our office to schedule the follow up appointment    Any Other Special Instructions Will Be Listed Below (If Applicable).   If you need a refill on your cardiac medications before your next appointment, please call your pharmacy.    SignedFinis Bud, NP  04/07/2022 10:39 AM    [place]

## 2022-04-08 ENCOUNTER — Other Ambulatory Visit: Payer: Self-pay | Admitting: Family Medicine

## 2022-04-15 ENCOUNTER — Ambulatory Visit (HOSPITAL_COMMUNITY)
Admission: RE | Admit: 2022-04-15 | Discharge: 2022-04-15 | Disposition: A | Discharge status: Home / Self Care | Payer: Medicare Other | Source: Ambulatory Visit | Attending: Family Medicine | Admitting: Family Medicine

## 2022-04-15 DIAGNOSIS — M1711 Unilateral primary osteoarthritis, right knee: Secondary | ICD-10-CM | POA: Diagnosis not present

## 2022-04-19 ENCOUNTER — Emergency Department (HOSPITAL_COMMUNITY)
Admission: EM | Admit: 2022-04-19 | Discharge: 2022-04-19 | Disposition: A | Discharge status: Home / Self Care | Payer: Medicare Other | Attending: Emergency Medicine | Admitting: Emergency Medicine

## 2022-04-19 ENCOUNTER — Other Ambulatory Visit: Payer: Self-pay

## 2022-04-19 DIAGNOSIS — Z79899 Other long term (current) drug therapy: Secondary | ICD-10-CM | POA: Diagnosis not present

## 2022-04-19 DIAGNOSIS — Z7901 Long term (current) use of anticoagulants: Secondary | ICD-10-CM | POA: Diagnosis not present

## 2022-04-19 DIAGNOSIS — I10 Essential (primary) hypertension: Secondary | ICD-10-CM | POA: Insufficient documentation

## 2022-04-19 DIAGNOSIS — M791 Myalgia, unspecified site: Secondary | ICD-10-CM | POA: Diagnosis present

## 2022-04-19 LAB — CBG MONITORING, ED: Glucose-Capillary: 110 mg/dL — ABNORMAL HIGH (ref 70–99)

## 2022-04-19 MED ORDER — ACETAMINOPHEN 325 MG PO TABS
650.0000 mg | ORAL_TABLET | Freq: Once | ORAL | Status: AC
  Administered 2022-04-19: 650 mg via ORAL
  Filled 2022-04-19: qty 2

## 2022-04-19 MED ORDER — IBUPROFEN 800 MG PO TABS
800.0000 mg | ORAL_TABLET | Freq: Once | ORAL | Status: AC
  Administered 2022-04-19: 800 mg via ORAL
  Filled 2022-04-19: qty 1

## 2022-04-19 NOTE — Discharge Instructions (Signed)
Follow-up with your family doctor if any problems.  Take Tylenol or Motrin for pain

## 2022-04-19 NOTE — ED Provider Notes (Signed)
[facility] Provider Note   CSN: [id number] Arrival date & time: 04/19/22  1326     History {Add pertinent medical, surgical, social history, OB history to HPI:1} Chief Complaint  Patient presents with   Allergic Reaction    [name] is a 67 y.o. female.  Patient has a history of hypertension.  She got a COVID shot recently and now complains of myalgias.   Allergic Reaction      Home Medications Prior to Admission medications   Medication Sig Start Date End Date Taking? Authorizing Provider  acetaminophen (TYLENOL) 500 MG tablet Take 1 tablet (500 mg total) by mouth 2 (two) times daily. Patient taking differently: Take 500 mg by mouth 2 (two) times daily as needed for mild pain or headache. 08/15/14   [name], MD  apixaban (ELIQUIS) 5 MG TABS tablet Take 1 tablet (5 mg total) by mouth 2 (two) times daily. 10/31/21   [name], MD  benazepril (LOTENSIN) 20 MG tablet Take 1 tablet (20 mg total) by mouth daily. 04/07/22   [name], MD  diltiazem (CARDIZEM CD) 180 MG 24 hr capsule Take 1 capsule (180 mg total) by mouth daily. 04/07/22   [name], MD  ezetimibe (ZETIA) 10 MG tablet Take 1 tablet (10 mg total) by mouth daily. 04/07/22   [name], MD  FARXIGA 5 MG TABS tablet Take 5 mg by mouth every morning. 08/14/21   [provider]  LAMICTAL 150 MG tablet Take 1/2 tablet twice a day 01/22/22   Cameron Sprang, MD  levETIRAcetam (KEPPRA XR) 750 MG 24 hr tablet Take 2 tablets twice a day 01/22/22   Cameron Sprang, MD  levothyroxine (SYNTHROID) 125 MCG tablet Take 1 tablet (125 mcg total) by mouth daily. 04/03/22   Brita Romp, NP  montelukast (SINGULAIR) 10 MG tablet TAKE (1) TABLET BY MOUTH AT BEDTIME. 04/07/22   [name], MD  omeprazole (PRILOSEC) 20 MG capsule TAKE ONE CAPSULE BY MOUTH ONCE DAILY. 04/08/22   [name], MD  potassium chloride  (KLOR-CON) 10 MEQ tablet Take 10 mEq by mouth daily. 03/18/21   [provider]  rosuvastatin (CRESTOR) 40 MG tablet Take 1 tablet (40 mg total) by mouth daily. 04/07/22   [name], MD  UNABLE TO FIND [facility] Bath chair Bath mat 06/20/20   [name], MD      Allergies    Simvastatin    Review of Systems   Review of Systems  Physical Exam Updated Vital Signs BP (!) 120/58   Pulse (!) 107   Temp 98.2 F (36.8 C) (Oral)   Resp 20   Ht '5\' 5"'$  (1.651 m)   Wt 109.3 kg   SpO2 97%   BMI 40.10 kg/m  Physical Exam  ED Results / Procedures / Treatments   Labs (all labs ordered are listed, but only abnormal results are displayed) Labs Reviewed  CBG MONITORING, ED - Abnormal; Notable for the following components:      Result Value   Glucose-Capillary 110 (*)    All other components within normal limits    EKG None  Radiology No results found.  Procedures Procedures  {Document cardiac monitor, telemetry assessment procedure when appropriate:1}  Medications Ordered in ED Medications  acetaminophen (TYLENOL) tablet 650 mg (650 mg Oral Given 04/19/22 1423)  ibuprofen (ADVIL) tablet 800 mg (800 mg Oral Given 04/19/22 1423)  ED Course/ Medical Decision Making/ A&P   {   Click here for ABCD2, HEART and other calculatorsREFRESH Note before signing :1}                          Medical Decision Making Risk OTC drugs. Prescription drug management.   Patient with myalgias from COVID shot.  She is given Tylenol Motrin and will follow-up as needed  {Document critical care time when appropriate:1} {Document review of labs and clinical decision tools ie heart score, Chads2Vasc2 etc:1}  {Document your independent review of radiology images, and any outside records:1} {Document your discussion with family members, caretakers, and with consultants:1} {Document social determinants of health affecting pt's care:1} {Document your decision making why or why  not admission, treatments were needed:1} Final Clinical Impression(s) / ED Diagnoses Final diagnoses:  Myalgia    Rx / DC Orders ED Discharge Orders     None

## 2022-04-19 NOTE — ED Triage Notes (Signed)
Pt BIB [facility]. Pt lives alone. Has history of seizures from childhood, takes medication daily for them. Pt thinks she having reaction from Covid shot yesterday. C/O headache and body aches. States rigth am is itching

## 2022-04-24 ENCOUNTER — Telehealth: Payer: Self-pay | Admitting: *Deleted

## 2022-04-24 NOTE — Telephone Encounter (Signed)
     Patient  visit on 04/19/2022  at [facility]  was for treatment   Have you been able to follow up with your primary care physician? Patient is feeling good doesnt require follow up according to her The patient was able to obtain any needed medicine or equipment.  Are there diet recommendations that you are having difficulty following?  Patient expresses understanding of discharge instructions and education provided has no other needs at this time.    [facility] [phone] [address] , [place] [address] Email : [email or web address]

## 2022-04-25 ENCOUNTER — Ambulatory Visit (INDEPENDENT_AMBULATORY_CARE_PROVIDER_SITE_OTHER): Payer: Medicare Other | Admitting: Neurology

## 2022-04-25 ENCOUNTER — Encounter: Payer: Self-pay | Admitting: Neurology

## 2022-04-25 VITALS — BP 120/79 | HR 80 | Ht 65.0 in | Wt 245.6 lb

## 2022-04-25 DIAGNOSIS — G40009 Localization-related (focal) (partial) idiopathic epilepsy and epileptic syndromes with seizures of localized onset, not intractable, without status epilepticus: Secondary | ICD-10-CM

## 2022-04-25 MED ORDER — LAMICTAL 150 MG PO TABS: ORAL_TABLET | ORAL | 3 refills | Status: DC

## 2022-04-25 MED ORDER — LEVETIRACETAM ER 750 MG PO TB24: ORAL_TABLET | ORAL | 3 refills | Status: DC

## 2022-04-25 NOTE — Progress Notes (Unsigned)
NEUROLOGY FOLLOW UP OFFICE NOTE  [name] EG:[id number] 07/16/1955  HISTORY OF PRESENT ILLNESS: I had the pleasure of seeing [name] in follow-up in the neurology clinic on 04/25/2022.  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 She is alone in the office today. Records and images were personally reviewed where available.  I personally reviewed brain MRI with and without contrast done  01/2022 which did not show any acute changes. There was again note of mild asymmetric atrophy and increased T2 signal in the left hippocampus, cerebellar atrophy, and mild chronic microvascular disease. Her EEG in 01/2022 was within normal limits. She is on Levetiracetam ER '750mg'$  2 tabs BID ('1500mg'$  BID) and Lamotrigine '150mg'$  1/2 tablet BID ('75mg'$  BID) without side effects. She states she has not had any seizures since her last visit (she was previously reporting staring spells up until 12/2021). No GTCs since 07/2021. She lives alone. She denies any olfactory/gustatory hallucinations, focal numbness/tingling/weakness, myoclonic jerks. She denies any headaches, dizziness, vision changes. She has right leg pain. No falls. Sleep is good. Mood is "alright." She does not drive.    History on Initial Assessment 01/22/2022: This is a 67 year old right-handed woman with a history of hypertension, hyperlipidemia, hypothyroidism, atrial fibrillation on Eliquis, partial nephrectomy, mild intellectual disability, presenting to establish care for seizures as Dr. Merlene Laughter is closing his office. She is a poor historian but able to answer questions adequately. She states seizures started as a baby. She does not recall prior medications she had taken. She reports that she has been having "little ones" that want to come, she feels like she is dizzy and "going out of my head." She felt this way this morning. She feels this just started when "they changed my medications." Last note from Dr. Freddie Apley office  from June 2023 indicate that her Levetiracetam was switched to extended-release. She was in the ER the day prior on 08/12/21 after having 2 seizures at home. Her daughter had previously described seizures as spacing out then collapsing and shaking with tongue bite, sleepiness and disorientation after. She states this was the last time she had a GTC. She has been told she would stare off, the last time she was told was some time this month. Sometimes she notices a metallic taste/dry mouth. She denies any focal numbness/tingling/weakness or myoclonic jerks. She has pain on her right knee from arthritis. She denies any headaches, diplopia, dysarthria/dysphagia, neck/back pain, bowel/bladder dysfunction. She reports sleeping well, then stated she wakes up several times at night, no daytime drowsiness. She lives alone, her daughter lives next door. She has an aide coming daily to helps with meals. Her daughter manages finances. She manages her own medications with a pillpack and denies missing any doses. On her last visit with Dr. Merlene Laughter, daughter was concerned she was not taking medications properly despite being prepackaged. She is on Levetiracetam ER '750mg'$  2 tabs BID ('1500mg'$  BID) and Lamotrigine '150mg'$  1/2 tablet BID ('75mg'$  BID). Per notes, she did not tolerate higher dose of Lamotrigine and was sleepy on immediate-release Levetiracetam.   Epilepsy Risk Factors:  Mild intellectual disability. There is no history of febrile convulsions, CNS infections such as meningitis/encephalitis, significant traumatic brain injury, neurosurgical procedures, or family history of seizures.  Diagnostic Data: I personally reviewed MRI brain without contrast done 06/2013 with asymmetric atrophy and mildly increased T2 signal in the left hippocampus. Mild chronic microvascular disease. There was marked, disproportionate cerebellar atrophy.  brain  MRI with and without contrast done 01/2022 did not show any acute changes. There was  again note of mild asymmetric atrophy and increased T2 signal in the left hippocampus, cerebellar atrophy, and mild chronic microvascular disease.   EEG from 2020 was normal.  EEG in 01/2022 was within normal limits.   Prior ASMs: she did not tolerate higher dose of Lamotrigine and was sleepy on immediate-release Levetiracetam.    PAST MEDICAL HISTORY: Past Medical History:  Diagnosis Date   A-fib ([place])    Anemia    Depression    Dyspnea    Fractures    Hyperlipidemia    Hypertension    Mental retardation, mild (I.Q. 50-70)    lives alone and cares for self has a Marine scientist aide comes once a day    Obesity    Pneumonia    PONV (postoperative nausea and vomiting)    Seizure ([place])    11/17/2012 "still having them; "I believe their from a tumor in my head" ((11/17/2012   Seizure disorder ([place])    Thyroid cancer ([place])     MEDICATIONS: Current Outpatient Medications on File Prior to Visit  Medication Sig Dispense Refill   acetaminophen (TYLENOL) 500 MG tablet Take 1 tablet (500 mg total) by mouth 2 (two) times daily. (Patient taking differently: Take 500 mg by mouth 2 (two) times daily as needed for mild pain or headache.) 60 tablet 5   apixaban (ELIQUIS) 5 MG TABS tablet Take 1 tablet (5 mg total) by mouth 2 (two) times daily. 60 tablet 5   benazepril (LOTENSIN) 20 MG tablet Take 1 tablet (20 mg total) by mouth daily. 30 tablet 5   diltiazem (CARDIZEM CD) 180 MG 24 hr capsule Take 1 capsule (180 mg total) by mouth daily. 30 capsule 5   ezetimibe (ZETIA) 10 MG tablet Take 1 tablet (10 mg total) by mouth daily. 30 tablet 5   FARXIGA 5 MG TABS tablet Take 5 mg by mouth every morning.     hydrALAZINE (APRESOLINE) 50 MG tablet Take 50 mg by mouth 2 (two) times daily.     LAMICTAL 150 MG tablet Take 1/2 tablet twice a day 90 tablet 3   levETIRAcetam (KEPPRA XR) 750 MG 24 hr tablet Take 2 tablets twice a day 360 tablet 3   levothyroxine (SYNTHROID) 125 MCG tablet Take 1 tablet (125 mcg total)  by mouth daily. 90 tablet 3   montelukast (SINGULAIR) 10 MG tablet TAKE (1) TABLET BY MOUTH AT BEDTIME. 30 tablet 5   omeprazole (PRILOSEC) 20 MG capsule TAKE ONE CAPSULE BY MOUTH ONCE DAILY. 30 capsule 0   potassium chloride (KLOR-CON) 10 MEQ tablet Take 10 mEq by mouth daily.     rosuvastatin (CRESTOR) 40 MG tablet Take 1 tablet (40 mg total) by mouth daily. 30 tablet 5   UNABLE TO FIND HHS Bath chair Bath mat 1 each 0   No current facility-administered medications on file prior to visit.    ALLERGIES: Allergies  Allergen Reactions   Simvastatin Other (See Comments)    Muscle aches    FAMILY HISTORY: Family History  Problem Relation Age of Onset   Diabetes Mother    Colon cancer Neg Hx     SOCIAL HISTORY: Social History   Socioeconomic History   Marital status: Single    Spouse name: Not on file   Number of children: 2   Years of education: Not on file   Highest education level: Not on file  Occupational History  Occupation: disabled   Tobacco Use   Smoking status: Never   Smokeless tobacco: Never  Vaping Use   Vaping Use: Never used  Substance and Sexual Activity   Alcohol use: No   Drug use: No   Sexual activity: Never  Other Topics Concern   Not on file  Social History Narrative   Right handed   Caffeine intake yes   Retired   Lives alone   Social Determinants of Health   Financial Resource Strain: [place]  (06/13/2020)   Overall Financial Resource Strain (CARDIA)    Difficulty of Paying Living Expenses: Not hard at all  Food Insecurity: No Food Insecurity (06/13/2020)   Hunger Vital Sign    Worried About Running Out of Food in the Last Year: Never true    Ran Out of Food in the Last Year: Never true  Transportation Needs: No Transportation Needs (06/13/2020)   PRAPARE - Hydrologist (Medical): No    Lack of Transportation (Non-Medical): No  Physical Activity: Sufficiently Active (06/13/2020)   Exercise Vital Sign     Days of Exercise per Week: 7 days    Minutes of Exercise per Session: 60 min  Stress: No Stress Concern Present (06/13/2020)   [place]    Feeling of Stress : Not at all  Social Connections: Socially Isolated (06/13/2020)   Social Connection and Isolation Panel [NHANES]    Frequency of Communication with Friends and Family: More than three times a week    Frequency of Social Gatherings with Friends and Family: Twice a week    Attends Religious Services: Never    Marine scientist or Organizations: No    Attends Archivist Meetings: Never    Marital Status: Never married  Intimate Partner Violence: Not At Risk (06/13/2020)   Humiliation, Afraid, Rape, and Kick questionnaire    Fear of Current or Ex-Partner: No    Emotionally Abused: No    Physically Abused: No    Sexually Abused: No     PHYSICAL EXAM: Vitals:   04/25/22 1545  BP: 120/79  Pulse: 80  SpO2: 98%   General: No acute distress Head:  Normocephalic/atraumatic Skin/Extremities: No rash, no edema Neurological Exam: alert and awake. No aphasia or dysarthria. Fund of knowledge is appropriate.  Attention and concentration are normal.   Cranial nerves: Pupils equal, round. Extraocular movements intact with no nystagmus. Visual fields full.  No facial asymmetry.  Motor: Bulk and tone normal, muscle strength 5/5 throughout with no pronator drift.   Finger to nose testing intact.  Gait slow and cautious reporting right leg pain, no ataxia.    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 MRI brain showed asymmetric atrophy and increased signal in the left hippocampus, EEG normal. Last GTC was in June 2023, she was previously reporting staring spells up until 12/2021 visit,  denies any seizures since then. Continue Levetiracetam ER '750mg'$  2 tabs BID and Lamotrigine '150mg'$  1/2 tablet BID. She does not drive. Follow-up in 6 months, call for any changes.    Thank you for allowing me to participate in her care.  Please do not hesitate to call for any questions or concerns.    Ellouise Newer, M.D.   CC: Dr. Moshe Cipro

## 2022-04-25 NOTE — Patient Instructions (Signed)
Good to see you doing well. Continue all your medications. Follow-up in 6 months, call for any changes.    Seizure Precautions: 1. If medication has been prescribed for you to prevent seizures, take it exactly as directed.  Do not stop taking the medicine without talking to your doctor first, even if you have not had a seizure in a long time.   2. Avoid activities in which a seizure would cause danger to yourself or to others.  Don't operate dangerous machinery, swim alone, or climb in high or dangerous places, such as on ladders, roofs, or girders.  Do not drive unless your doctor says you may.  3. If you have any warning that you may have a seizure, lay down in a safe place where you can't hurt yourself.    4.  No driving for 6 months from last seizure, as per [facility].   Please refer to the following link on the [place] website for more information: http://[email or web address].org/[email or web address]/driving/drivingu.cfm   5.  Maintain good sleep hygiene.  6.  Contact your doctor if you have any problems that may be related to the medicine you are taking.  7.  Call 911 and bring the patient back to the ED if:        A.  The seizure lasts longer than 5 minutes.       B.  The patient doesn't awaken shortly after the seizure  C.  The patient has new problems such as difficulty seeing, speaking or moving  D.  The patient was injured during the seizure  E.  The patient has a temperature over 102 F (39C)  F.  The patient vomited and now is having trouble breathing

## 2022-05-06 ENCOUNTER — Ambulatory Visit: Payer: Medicare Other | Admitting: Internal Medicine

## 2022-05-07 ENCOUNTER — Encounter: Payer: Self-pay | Admitting: Internal Medicine

## 2022-05-07 ENCOUNTER — Ambulatory Visit (INDEPENDENT_AMBULATORY_CARE_PROVIDER_SITE_OTHER): Payer: Medicare Other | Admitting: Internal Medicine

## 2022-05-07 VITALS — BP 137/74 | HR 100 | Resp 16 | Ht 65.0 in | Wt 244.1 lb

## 2022-05-07 DIAGNOSIS — W19XXXA Unspecified fall, initial encounter: Secondary | ICD-10-CM

## 2022-05-07 DIAGNOSIS — R569 Unspecified convulsions: Secondary | ICD-10-CM | POA: Diagnosis not present

## 2022-05-07 NOTE — Assessment & Plan Note (Signed)
Recommended patient update the neurology office. She will continue levetiracetam ER 750 mg, 2 tablets, twice daily She will continue Lamictal 150 mg, half a tablet, twice a day

## 2022-05-07 NOTE — Progress Notes (Signed)
   HPI:[name] is a 67 y.o. female who presents for evaluation after a fall.  Patient fell off her couch on 04/30/2022 during a seizure and has bruising to her right side.  He has no focal pain today and feels fine other than brusing. She tells me she has had seizures since a child and follows with neurology.  Her last appointment was at the beginning of this month.  She currently takes levetiracetam ER 750 mg, 2 tablets ,twice daily and Lamictal 150 mg ,half a tablet ,twice a day. She doesn't recall missing any doses of her antiepileptic medication.  No other seizures since the activity she reported to her neurologist of [place] June 2023 and staring spells.  Physical Exam: Vitals:   05/07/22 0947  BP: 137/74  Pulse: 100  Resp: 16  SpO2: 93%  Weight: 244 lb 1.9 oz (110.7 kg)  Height: 5\' 5"  (1.651 m)     Physical Exam Constitutional:      General: She is not in acute distress.    Appearance: She is not ill-appearing.  Cardiovascular:     Rate and Rhythm: Normal rate and regular rhythm.     Heart sounds: Murmur (Right upper sternal border) heard.     Systolic murmur is present with a grade of 2/6.  Musculoskeletal:     Comments: RIGHT Shoulder: No obvious deformity or asymmetry. bruising. No swelling No TTP Full ROM and strength in flexion, abduction, internal/external rotation    Skin:    Findings: Bruising (right upper extremity ( reports also over her right breast)) present.  Neurological:     General: No focal deficit present.     Mental Status: Mental status is at baseline.      Assessment & Plan:   [name] was seen today for fall.  Fall, initial encounter Assessment & Plan: Patient is on Eliquis and has some bruising.  No focal pain or limitations to her range of movement of right shoulder. No imaging recommended at this time. Recommend Arnica cream to help with bruising.   Seizures ([place]) Overview: Qualifier: Diagnosis of  By: Rennie Plowman     Assessment & Plan: Recommended patient update the neurology office. She will continue levetiracetam ER 750 mg, 2 tablets, twice daily She will continue Lamictal 150 mg, half a tablet, twice a day        Lorene Dy, MD

## 2022-05-07 NOTE — Patient Instructions (Signed)
Thank you, Ms.Delrae Alfred Boerner for allowing [place] to provide your care today.   No further workup needed today. You can use arnica cream to help with healing bruises.     Tamsen Snider, M.D.

## 2022-05-07 NOTE — Assessment & Plan Note (Signed)
Patient is on Eliquis and has some bruising.  No focal pain or limitations to her range of movement of right shoulder. No imaging recommended at this time. Recommend Arnica cream to help with bruising.

## 2022-05-12 ENCOUNTER — Other Ambulatory Visit: Payer: Self-pay | Admitting: Family Medicine

## 2022-05-19 ENCOUNTER — Telehealth: Payer: Self-pay

## 2022-05-19 ENCOUNTER — Other Ambulatory Visit: Payer: Self-pay

## 2022-05-19 DIAGNOSIS — G4730 Sleep apnea, unspecified: Secondary | ICD-10-CM

## 2022-05-19 MED ORDER — LAMICTAL 150 MG PO TABS: ORAL_TABLET | ORAL | 3 refills | Status: DC

## 2022-05-19 NOTE — Telephone Encounter (Signed)
-----   Message from Cameron Sprang, MD sent at 05/19/2022  1:38 PM EDT ----- Can you pls call patient and let her know her PCP contacted [place] about her recent seizure on 3/6. I would like to increase her Lamictal 150mg : Take 1 tablet BID. Pls see if she is okay with this, I can send in updated Rx, thanks  ----- Message ----- From: Lyndal Pulley, MD Sent: 05/07/2022  10:29 AM EDT To: Cameron Sprang, MD  FYI- Patient had a unwitnessed seizure on 3/6 and fell of couch. She came in for some bruising and has no other injury. Mostly controlled, but wanted to keep you in the loop.  I saw her today for my partner and with patients  intellectual disability I am not sure she is going to update you. Merry Proud

## 2022-05-19 NOTE — Telephone Encounter (Signed)
Spoke with pt Daughter informed her PCP contacted [place] about her recent seizure on 3/6 Dr Delice Lesch would like to increase her Lamictal 150mg : Take 1 tablet BID. They would like to increase the medication

## 2022-06-05 ENCOUNTER — Other Ambulatory Visit: Payer: Self-pay | Admitting: Urology

## 2022-06-05 LAB — COMPLETE METABOLIC PANEL WITH GFR
AG Ratio: 1.1 (calc) (ref 1.0–2.5)
ALT: 16 U/L (ref 6–29)
AST: 25 U/L (ref 10–35)
Albumin: 3.9 g/dL (ref 3.6–5.1)
Alkaline phosphatase (APISO): 137 U/L (ref 37–153)
BUN/Creatinine Ratio: 19 (calc) (ref 6–22)
BUN: 39 mg/dL — ABNORMAL HIGH (ref 7–25)
CO2: 21 mmol/L (ref 20–32)
Calcium: 8.7 mg/dL (ref 8.6–10.4)
Chloride: 107 mmol/L (ref 98–110)
Creat: 2.05 mg/dL — ABNORMAL HIGH (ref 0.50–1.05)
Globulin: 3.6 g/dL (calc) (ref 1.9–3.7)
Glucose, Bld: 88 mg/dL (ref 65–99)
Potassium: 4.7 mmol/L (ref 3.5–5.3)
Sodium: 140 mmol/L (ref 135–146)
Total Bilirubin: 0.5 mg/dL (ref 0.2–1.2)
Total Protein: 7.5 g/dL (ref 6.1–8.1)
eGFR: 26 mL/min/{1.73_m2} — ABNORMAL LOW (ref >=60)

## 2022-06-06 ENCOUNTER — Other Ambulatory Visit: Payer: Self-pay | Admitting: Family Medicine

## 2022-06-23 ENCOUNTER — Encounter: Payer: Medicare Other | Admitting: Internal Medicine

## 2022-07-09 ENCOUNTER — Other Ambulatory Visit: Payer: Self-pay | Admitting: Family Medicine

## 2022-07-24 ENCOUNTER — Ambulatory Visit (INDEPENDENT_AMBULATORY_CARE_PROVIDER_SITE_OTHER): Payer: Medicare Other | Admitting: Family Medicine

## 2022-07-24 ENCOUNTER — Encounter: Payer: Self-pay | Admitting: Family Medicine

## 2022-07-24 VITALS — BP 138/92 | HR 80 | Ht 65.0 in | Wt 247.0 lb

## 2022-07-24 DIAGNOSIS — H6691 Otitis media, unspecified, right ear: Secondary | ICD-10-CM

## 2022-07-24 DIAGNOSIS — H6690 Otitis media, unspecified, unspecified ear: Secondary | ICD-10-CM | POA: Insufficient documentation

## 2022-07-24 MED ORDER — AMOXICILLIN-POT CLAVULANATE 875-125 MG PO TABS: 1.0000 | ORAL_TABLET | Freq: Two times a day (BID) | ORAL | 0 refills | Status: AC

## 2022-07-24 NOTE — Assessment & Plan Note (Addendum)
Left ear irrigation done at the clinic Started  Augmentin 875-125 mg x 7 days Advise can apply ice pack or warm compress over the affected ear.

## 2022-07-24 NOTE — Progress Notes (Signed)
Patient Office Visit   Subjective   Patient ID: [name], female    DOB: [birth year]  Age: 67 y.o. MRN: [id number]  CC:  Chief Complaint  Patient presents with   Ear Fullness    Patient complains of L ear fullness and burning since yesterday.     HPI Christine Debruin McCain3 year old female, presents to the clinic for left ear fullness and burning started yesterday. 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  Ear Pain: Patient presents with left ear pain.  Symptoms include congestion, irritability, and swollen glands. Symptoms began 1 day ago and are gradually worsening since that time. Patient reports pulling on the left ear. With no known previous ear infections. Left ear pain has been gradually worsening. There has been no fever. The pain is at a severity of 10/10. She has tried ear drops for the symptoms. The treatment provided no relief.        Outpatient Encounter Medications as of 07/24/2022  Medication Sig   acetaminophen (TYLENOL) 500 MG tablet Take 1 tablet (500 mg total) by mouth 2 (two) times daily. (Patient taking differently: Take 500 mg by mouth 2 (two) times daily as needed for mild pain or headache.)   amoxicillin-clavulanate (AUGMENTIN) 875-125 MG tablet Take 1 tablet by mouth 2 (two) times daily for 7 days.   benazepril (LOTENSIN) 20 MG tablet Take 1 tablet (20 mg total) by mouth daily.   cinacalcet (SENSIPAR) 30 MG tablet Take 30 mg by mouth daily.   diltiazem (CARDIZEM CD) 180 MG 24 hr capsule Take 1 capsule (180 mg total) by mouth daily.   ELIQUIS 5 MG TABS tablet TAKE 1 TABLET BY MOUTH TWICE DAILY.   ezetimibe (ZETIA) 10 MG tablet Take 1 tablet (10 mg total) by mouth daily.   FARXIGA 5 MG TABS tablet Take 5 mg by mouth every  morning.   hydrALAZINE (APRESOLINE) 50 MG tablet Take 50 mg by mouth 2 (two) times daily.   LAMICTAL 150 MG tablet Take 1 tablet twice a day   levETIRAcetam (KEPPRA XR) 750 MG 24 hr tablet Take 2 tablets twice a day   levothyroxine (SYNTHROID) 125 MCG tablet Take 1 tablet (125 mcg total) by mouth daily.   montelukast (SINGULAIR) 10 MG tablet TAKE (1) TABLET BY MOUTH AT BEDTIME.   omeprazole (PRILOSEC) 20 MG capsule TAKE ONE CAPSULE BY MOUTH ONCE DAILY.   potassium chloride (KLOR-CON) 10 MEQ tablet Take 10 mEq by mouth daily.   rosuvastatin (CRESTOR) 40 MG tablet Take 1 tablet (40 mg total) by mouth daily.   UNABLE TO FIND HHS Bath chair Bath mat   No facility-administered encounter medications on file as of 07/24/2022.    Past Surgical History:  Procedure Laterality Date   ABDOMINAL HYSTERECTOMY  2001   BREAST LUMPECTOMY  1996   right    COLONOSCOPY N/A 05/16/2016   Dr. Darrick Penna: Redundant colon, hemorrhoids, next colonoscopy 10 years   INGUINAL HERNIA REPAIR N/A 08/12/2019   Procedure: laparoscopic incisional hernia repair;  Surgeon: Crista Elliot, MD;  Location: [facility];  Service: Urology;  Laterality: N/A;   ROBOTIC ASSITED PARTIAL NEPHRECTOMY Right 08/05/2019   Procedure: XI ROBOTIC ASSITED PARTIAL NEPHRECTOMY WITH INTRAOPERATIVE ULTRASOUND;  Surgeon: Sebastian Ache, MD;  Location: [facility];  Service: Urology;  Laterality: Right;  3 HRS   THYROIDECTOMY Bilateral 11/17/2012   Procedure: TOTAL THYROIDECTOMY;  Surgeon: Darletta Moll, MD;  Location: [facility] OR;  Service: ENT;  Laterality: Bilateral;   TOTAL THYROIDECTOMY Bilateral 11/17/2012   TUBAL LIGATION      Review of Systems  Constitutional:  Negative for chills and fever.  HENT:  Positive for congestion and ear pain. Negative for ear discharge.   Eyes:  Negative for blurred vision.  Respiratory:  Negative for shortness of breath.   Cardiovascular:  Negative for chest pain.  Gastrointestinal:  Negative for abdominal pain and vomiting.   Genitourinary:  Negative for dysuria.  Musculoskeletal:  Negative for myalgias.  Skin:  Positive for itching.  Neurological:  Negative for dizziness.      Objective    BP (!) 138/92   Pulse 80   Ht 5\' 5"  (1.651 m)   Wt 247 lb (112 kg)   SpO2 98%   BMI 41.10 kg/m   Physical Exam Vitals reviewed.  Constitutional:      General: She is not in acute distress.    Appearance: Normal appearance. She is not ill-appearing, toxic-appearing or diaphoretic.  HENT:     Head: Normocephalic.     Right Ear: There is impacted cerumen. Tympanic membrane is not erythematous.     Left Ear: There is impacted cerumen. Tympanic membrane is erythematous.  Eyes:     General:        Right eye: No discharge.        Left eye: No discharge.     Conjunctiva/sclera: Conjunctivae normal.  Cardiovascular:     Rate and Rhythm: Normal rate.     Pulses: Normal pulses.     Heart sounds: Normal heart sounds.  Pulmonary:     Effort: Pulmonary effort is normal. No respiratory distress.     Breath sounds: Normal breath sounds.  Musculoskeletal:        General: Normal range of motion.     Cervical back: Normal range of motion.  Skin:    General: Skin is warm and dry.     Capillary Refill: Capillary refill takes less than 2 seconds.  Neurological:     General: No focal deficit present.     Mental Status: She is alert and oriented to person, place, and time.     Coordination: Coordination normal.     Gait: Gait normal.  Psychiatric:        Mood and Affect: Mood normal.        Behavior: Behavior normal.       Assessment & Plan:  Right otitis media, unspecified otitis media type Assessment & Plan: Left ear irrigation done at the clinic Started  Augmentin 875-125 mg x 7 days Advise can apply ice pack or warm compress over the affected ear.   Other orders -     Amoxicillin-Pot Clavulanate; Take 1 tablet by mouth 2 (two) times daily for 7 days.  Dispense: 14 tablet; Refill: 0    Return if symptoms  worsen or fail to improve.   Cruzita Lederer Newman Nip, FNP

## 2022-07-24 NOTE — Patient Instructions (Signed)
        Great to see you today.   - Please take medications as prescribed. - Follow up with your primary health provider if any health concerns arises. - If symptoms worsen please contact your primary care provider and/or visit the emergency department.  

## 2022-08-03 ENCOUNTER — Encounter (HOSPITAL_COMMUNITY): Payer: Self-pay

## 2022-08-03 ENCOUNTER — Emergency Department (HOSPITAL_COMMUNITY)
Admission: EM | Admit: 2022-08-03 | Discharge: 2022-08-03 | Disposition: A | Discharge status: Home / Self Care | Payer: Medicare Other | Attending: Emergency Medicine | Admitting: Emergency Medicine

## 2022-08-03 ENCOUNTER — Emergency Department (HOSPITAL_COMMUNITY): Payer: Medicare Other

## 2022-08-03 ENCOUNTER — Other Ambulatory Visit: Payer: Self-pay

## 2022-08-03 DIAGNOSIS — Z85850 Personal history of malignant neoplasm of thyroid: Secondary | ICD-10-CM | POA: Diagnosis not present

## 2022-08-03 DIAGNOSIS — Z7901 Long term (current) use of anticoagulants: Secondary | ICD-10-CM | POA: Diagnosis not present

## 2022-08-03 DIAGNOSIS — I129 Hypertensive chronic kidney disease with stage 1 through stage 4 chronic kidney disease, or unspecified chronic kidney disease: Secondary | ICD-10-CM | POA: Diagnosis not present

## 2022-08-03 DIAGNOSIS — R569 Unspecified convulsions: Secondary | ICD-10-CM | POA: Insufficient documentation

## 2022-08-03 DIAGNOSIS — N189 Chronic kidney disease, unspecified: Secondary | ICD-10-CM | POA: Insufficient documentation

## 2022-08-03 LAB — CBG MONITORING, ED: Glucose-Capillary: 110 mg/dL — ABNORMAL HIGH (ref 70–99)

## 2022-08-03 LAB — CBC WITH DIFFERENTIAL/PLATELET
Abs Immature Granulocytes: 0.02 10*3/uL (ref 0.00–0.07)
Basophils Absolute: 0 10*3/uL (ref 0.0–0.1)
Basophils Relative: 0 %
Eosinophils Absolute: 0 10*3/uL (ref 0.0–0.5)
Eosinophils Relative: 0 %
HCT: 34.8 % — ABNORMAL LOW (ref 36.0–46.0)
Hemoglobin: 11 g/dL — ABNORMAL LOW (ref 12.0–15.0)
Immature Granulocytes: 0 %
Lymphocytes Relative: 17 %
Lymphs Abs: 1.1 10*3/uL (ref 0.7–4.0)
MCH: 27.1 pg (ref 26.0–34.0)
MCHC: 31.6 g/dL (ref 30.0–36.0)
MCV: 85.7 fL (ref 80.0–100.0)
Monocytes Absolute: 0.4 10*3/uL (ref 0.1–1.0)
Monocytes Relative: 6 %
Neutro Abs: 5.2 10*3/uL (ref 1.7–7.7)
Neutrophils Relative %: 77 %
Platelets: 186 10*3/uL (ref 150–400)
RBC: 4.06 MIL/uL (ref 3.87–5.11)
RDW: 14.1 % (ref 11.5–15.5)
WBC: 6.8 10*3/uL (ref 4.0–10.5)
nRBC: 0 % (ref 0.0–0.2)

## 2022-08-03 LAB — RAPID URINE DRUG SCREEN, HOSP PERFORMED
Amphetamines: NOT DETECTED
Barbiturates: NOT DETECTED
Benzodiazepines: NOT DETECTED
Cocaine: NOT DETECTED
Opiates: NOT DETECTED
Tetrahydrocannabinol: NOT DETECTED

## 2022-08-03 LAB — COMPREHENSIVE METABOLIC PANEL
ALT: 23 U/L (ref 0–44)
AST: 28 U/L (ref 15–41)
Albumin: 3.8 g/dL (ref 3.5–5.0)
Alkaline Phosphatase: 127 U/L — ABNORMAL HIGH (ref 38–126)
Anion gap: 9 (ref 5–15)
BUN: 39 mg/dL — ABNORMAL HIGH (ref 8–23)
CO2: 22 mmol/L (ref 22–32)
Calcium: 8.4 mg/dL — ABNORMAL LOW (ref 8.9–10.3)
Chloride: 107 mmol/L (ref 98–111)
Creatinine, Ser: 2.3 mg/dL — ABNORMAL HIGH (ref 0.44–1.00)
GFR, Estimated: 23 mL/min — ABNORMAL LOW (ref >60)
Glucose, Bld: 120 mg/dL — ABNORMAL HIGH (ref 70–99)
Potassium: 4.1 mmol/L (ref 3.5–5.1)
Sodium: 138 mmol/L (ref 135–145)
Total Bilirubin: 0.5 mg/dL (ref 0.3–1.2)
Total Protein: 7.9 g/dL (ref 6.5–8.1)

## 2022-08-03 LAB — URINALYSIS, ROUTINE W REFLEX MICROSCOPIC
Bacteria, UA: NONE SEEN
Bilirubin Urine: NEGATIVE
Glucose, UA: >=500 mg/dL — AB
Ketones, ur: NEGATIVE mg/dL
Leukocytes,Ua: NEGATIVE
Nitrite: NEGATIVE
Protein, ur: 100 mg/dL — AB
Specific Gravity, Urine: 1.011 (ref 1.005–1.030)
pH: 5 (ref 5.0–8.0)

## 2022-08-03 LAB — ETHANOL: Alcohol, Ethyl (B): <10 mg/dL (ref <10)

## 2022-08-03 LAB — MAGNESIUM: Magnesium: 2 mg/dL (ref 1.7–2.4)

## 2022-08-03 LAB — TSH: TSH: 2.451 u[IU]/mL (ref 0.350–4.500)

## 2022-08-03 MED ORDER — LORAZEPAM 2 MG/ML IJ SOLN
1.0000 mg | Freq: Once | INTRAMUSCULAR | Status: AC
  Administered 2022-08-03: 1 mg via INTRAVENOUS
  Filled 2022-08-03: qty 1

## 2022-08-03 MED ORDER — LEVETIRACETAM IN NACL 1500 MG/100ML IV SOLN
1500.0000 mg | Freq: Once | INTRAVENOUS | Status: AC
  Administered 2022-08-03: 1500 mg via INTRAVENOUS
  Filled 2022-08-03: qty 100

## 2022-08-03 MED ORDER — LACTATED RINGERS IV BOLUS
500.0000 mL | Freq: Once | INTRAVENOUS | Status: AC
  Administered 2022-08-03: 500 mL via INTRAVENOUS

## 2022-08-03 MED ORDER — LAMOTRIGINE 25 MG PO TABS
150.0000 mg | ORAL_TABLET | Freq: Two times a day (BID) | ORAL | Status: DC
  Administered 2022-08-03: 150 mg via ORAL
  Filled 2022-08-03: qty 6

## 2022-08-03 NOTE — ED Provider Notes (Signed)
This patient is a 67 year old female presenting with recurrent seizures.  She has been given her morning dose of medications including lamotrigine.  She is currently on this medication and states she has not missed any doses, the daughter has now arrived and states that she has had intermittent seizures for many many years and this is nothing different.  She feels she is back to baseline and both the patient and the daughter feel comfortable going home.  I have reviewed labs and medications, vital signs and CT scans, there is no acute findings to suggest the need for inpatient stay and she has not had any recurrent seizures under my care over the last few hours and feels well  Creatinine is at baseline, no leukocytosis or significant anemia, magnesium level is normal, alcohol is undetectable, chest x-ray without findings and CT scan without any acute or new findings  She is followed by [facility] neurology and they will follow-up   Eber Hong, MD 08/03/22 [phone]

## 2022-08-03 NOTE — Discharge Instructions (Signed)
Please take all of your medications exactly as prescribed and follow-up with [place] neurology within the next 1 to 2 weeks.  I do want you to return to the ER for severe worsening seizures or if they continue throughout the day today or are increasing in frequency.

## 2022-08-03 NOTE — ED Notes (Signed)
CT tech stated pt seized on table lasting approximately 1 minute. What appeared to be grand mal per CT tech. EDP informed; additional keppra ordered. Pt a/o x4 at this time.

## 2022-08-03 NOTE — ED Provider Notes (Addendum)
[facility] EMERGENCY DEPARTMENT AT [facility] Provider Note   CSN: [id number] Arrival date & time: 08/03/22  0500     History  Chief Complaint  Patient presents with   Seizures    [name] is a 67 y.o. female.  HPI Patient presents for seizures.  These were witnessed by a friend of hers, who she was staying with her tonight.  Per EMS, they occurred while patient was sleeping.  Friend witnessed to consecutive seizures.  When EMS arrived on scene, patient was awake but postictal.  She had improved mentation during transit.  Vital signs were notable for tachycardia.  CBG was normal.  Currently, patient denies any physical complaints.  She states that she has been adherent to her Keppra and Lamictal AEDs.  Patient states that she lives independently.  When asked why she was staying at friend's house, she stated that she did not want to be alone last night.    Home Medications Prior to Admission medications   Medication Sig Start Date End Date Taking? Authorizing Provider  acetaminophen (TYLENOL) 500 MG tablet Take 1 tablet (500 mg total) by mouth 2 (two) times daily. Patient taking differently: Take 500 mg by mouth 2 (two) times daily as needed for mild pain or headache. 08/15/14   Kerri Perches, MD  benazepril (LOTENSIN) 20 MG tablet Take 1 tablet (20 mg total) by mouth daily. 04/07/22   Kerri Perches, MD  cinacalcet (SENSIPAR) 30 MG tablet Take 30 mg by mouth daily. 07/08/22   [provider]  diltiazem (CARDIZEM CD) 180 MG 24 hr capsule Take 1 capsule (180 mg total) by mouth daily. 04/07/22   Kerri Perches, MD  ELIQUIS 5 MG TABS tablet TAKE 1 TABLET BY MOUTH TWICE DAILY. 07/09/22   Kerri Perches, MD  ezetimibe (ZETIA) 10 MG tablet Take 1 tablet (10 mg total) by mouth daily. 04/07/22   Kerri Perches, MD  FARXIGA 5 MG TABS tablet Take 5 mg by mouth every morning. 08/14/21   [provider]  hydrALAZINE (APRESOLINE) 50 MG tablet  Take 50 mg by mouth 2 (two) times daily. 04/14/22   [provider]  LAMICTAL 150 MG tablet Take 1 tablet twice a day 05/19/22   Van Clines, MD  levETIRAcetam (KEPPRA XR) 750 MG 24 hr tablet Take 2 tablets twice a day 04/25/22   Van Clines, MD  levothyroxine (SYNTHROID) 125 MCG tablet Take 1 tablet (125 mcg total) by mouth daily. 04/03/22   Dani Gobble, NP  montelukast (SINGULAIR) 10 MG tablet TAKE (1) TABLET BY MOUTH AT BEDTIME. 04/07/22   Kerri Perches, MD  omeprazole (PRILOSEC) 20 MG capsule TAKE ONE CAPSULE BY MOUTH ONCE DAILY. 07/09/22   Kerri Perches, MD  potassium chloride (KLOR-CON) 10 MEQ tablet Take 10 mEq by mouth daily. 03/18/21   [provider]  rosuvastatin (CRESTOR) 40 MG tablet Take 1 tablet (40 mg total) by mouth daily. 04/07/22   Kerri Perches, MD  UNABLE TO FIND [facility] Bath chair Bath mat 06/20/20   Kerri Perches, MD      Allergies    Simvastatin    Review of Systems   Review of Systems  Neurological:  Positive for seizures.  All other systems reviewed and are negative.   Physical Exam Updated Vital Signs BP (!) 147/77   Pulse 95   Temp 98.8 F (37.1 C) (Oral)   Resp 19   Ht 5\' 5"  (  1.651 m)   Wt 112 kg   SpO2 97%   BMI 41.10 kg/m  Physical Exam Vitals and nursing note reviewed.  Constitutional:      General: She is not in acute distress.    Appearance: Normal appearance. She is well-developed. She is not ill-appearing, toxic-appearing or diaphoretic.  HENT:     Head: Normocephalic and atraumatic.     Right Ear: External ear normal.     Left Ear: External ear normal.     Nose: Nose normal.     Mouth/Throat:     Mouth: Mucous membranes are moist.  Eyes:     Extraocular Movements: Extraocular movements intact.     Conjunctiva/sclera: Conjunctivae normal.  Cardiovascular:     Rate and Rhythm: Normal rate and regular rhythm.  Pulmonary:     Effort: Pulmonary effort is normal. No respiratory distress.      Breath sounds: Normal breath sounds. No wheezing or rales.  Abdominal:     General: There is no distension.     Palpations: Abdomen is soft.     Tenderness: There is no abdominal tenderness.  Musculoskeletal:        General: No swelling. Normal range of motion.     Cervical back: Normal range of motion and neck supple.  Skin:    General: Skin is warm and dry.     Capillary Refill: Capillary refill takes less than 2 seconds.     Coloration: Skin is not jaundiced or pale.  Neurological:     General: No focal deficit present.     Mental Status: She is alert and oriented to person, place, and time.     Cranial Nerves: No cranial nerve deficit.     Sensory: No sensory deficit.     Motor: No weakness.     Coordination: Coordination normal.  Psychiatric:        Mood and Affect: Mood normal. Affect is flat.        Speech: Speech normal.        Behavior: Behavior is slowed. Behavior is cooperative.     ED Results / Procedures / Treatments   Labs (all labs ordered are listed, but only abnormal results are displayed) Labs Reviewed  COMPREHENSIVE METABOLIC PANEL - Abnormal; Notable for the following components:      Result Value   Glucose, Bld 120 (*)    BUN 39 (*)    Creatinine, Ser 2.30 (*)    Calcium 8.4 (*)    Alkaline Phosphatase 127 (*)    GFR, Estimated 23 (*)    All other components within normal limits  CBC WITH DIFFERENTIAL/PLATELET - Abnormal; Notable for the following components:   Hemoglobin 11.0 (*)    HCT 34.8 (*)    All other components within normal limits  CBG MONITORING, ED - Abnormal; Notable for the following components:   Glucose-Capillary 110 (*)    All other components within normal limits  MAGNESIUM  ETHANOL  URINALYSIS, ROUTINE W REFLEX MICROSCOPIC  RAPID URINE DRUG SCREEN, HOSP PERFORMED  LEVETIRACETAM LEVEL  LAMOTRIGINE LEVEL  TSH    EKG EKG Interpretation  Date/Time:  Sunday August 03 2022 05:12:13 EDT Ventricular Rate:  99 PR  Interval:  182 QRS Duration: 84 QT Interval:  344 QTC Calculation: 442 R Axis:   -1 Text Interpretation: Sinus rhythm Confirmed by Gloris Manchester ([id number]) on 08/03/2022 6:07:09 AM  Radiology CT HEAD WO CONTRAST  Result Date: 08/03/2022 CLINICAL DATA:  Mental status change with unknown cause.  Seizure this morning EXAM: CT HEAD WITHOUT CONTRAST TECHNIQUE: Contiguous axial images were obtained from the base of the skull through the vertex without intravenous contrast. RADIATION DOSE REDUCTION: This exam was performed according to the departmental dose-optimization program which includes automated exposure control, adjustment of the mA and/or kV according to patient size and/or use of iterative reconstruction technique. COMPARISON:  Brain MRI 04/23/2021 FINDINGS: Brain: No evidence of acute infarction, hemorrhage, hydrocephalus, extra-axial collection or mass lesion/mass effect. Symmetric atrophic cerebellum. Vascular: No hyperdense vessel or unexpected calcification. Skull: Normal. Negative for fracture or focal lesion. Sinuses/Orbits: No acute finding. IMPRESSION: No acute or reversible finding. Electronically Signed   By: Tiburcio Pea M.D.   On: 08/03/2022 06:29    Procedures Procedures    Medications Ordered in ED Medications  LORazepam (ATIVAN) injection 1 mg (has no administration in time range)  lamoTRIgine (LAMICTAL) tablet 150 mg (has no administration in time range)  lactated ringers bolus 500 mL (500 mLs Intravenous New Bag/Given 08/03/22 0540)  levETIRAcetam (KEPPRA) IVPB 1500 mg/ 100 mL premix (0 mg Intravenous Stopped 08/03/22 0616)  levETIRAcetam (KEPPRA) IVPB 1500 mg/ 100 mL premix (0 mg Intravenous Stopped 08/03/22 1610)    ED Course/ Medical Decision Making/ A&P                             Medical Decision Making Amount and/or Complexity of Data Reviewed Labs: ordered. Radiology: ordered.  Risk Prescription drug management.   This patient presents to the ED for concern of  seizures, this involves an extensive number of treatment options, and is a complaint that carries with it a high risk of complications and morbidity.  The differential diagnosis includes breakthrough seizures, medication nonadherence, infection, trauma, metabolic derangements   Co morbidities that complicate the patient evaluation  Seizures, mild intellectual disability, HTN, thyroid cancer, GERD, arthritis, CKD, atrial fibrillation   Additional history obtained:  Additional history obtained from EMS External records from outside source obtained and reviewed including EMR   Lab Tests:  I Ordered, and personally interpreted labs.  The pertinent results include: Baseline anemia, no leukocytosis, baseline CKD, normal electrolytes   Imaging Studies ordered:  I ordered imaging studies including CT head I independently visualized and interpreted imaging which showed no acute findings I agree with the radiologist interpretation   Cardiac Monitoring: / EKG:  The patient was maintained on a cardiac monitor.  I personally viewed and interpreted the cardiac monitored which showed an underlying rhythm of: Sinus rhythm   Consultations Obtained:  I requested consultation with the neurologist, Dr. Amada Jupiter,  and discussed lab and imaging findings as well as pertinent plan - they recommend: Additional Keppra, 1 mg Ativan, home dose of Lamictal, Admission to [facility] vs. DC home if patient remains seizure-free this morning.   Problem List / ED Course / Critical interventions / Medication management  Patient presents for seizures, witnessed by a friend of hers.  Patient was staying at a friend's house last night.  EMS noted tachycardia with otherwise normal vital signs, normal CBG, and no seizure activity during transit.  On arrival in the ED, patient is awake, alert, and oriented.  She does have slowed speech which is likely baseline based on chart review.  Patient has documented mild  intellectual disability.  She has no focal neurologic deficits at this time.  Patient arrives in the ED at 5 AM.  EMS stated that she had not taken her Keppra this  morning.  When asked if she had taken her Keppra yesterday (Saturday) this morning, she states that she had.  She also states that she is adherent to her other home medications, including Lamictal.  Patient was given IV fluids and 1 dose of Keppra.  Workup was initiated.  Initial lab results are unremarkable.  While patient was undergoing CT head, CT tech noted generalized seizure activity.  This reportedly lasted for approximately 1 minute and resolved on its own.  Additional IV Keppra was ordered.  Neurology was consulted.  Neurology also recommends IV Ativan and home dose of Lamictal.  Given her known seizure history, she does not need EEG.  She may benefit from admission for observation.  Care patient was signed out to oncoming ED provider. I ordered medication including Keppra, Ativan, Lamictal for seizure prophylaxis; IV fluids for hydration Reevaluation of the patient after these medicines showed that the patient improved I have reviewed the patients home medicines and have made adjustments as needed   Social Determinants of Health:  Has mild intellectual disability at baseline, has access to outpatient care, lives independently         Final Clinical Impression(s) / ED Diagnoses Final diagnoses:  Seizure [facility])    Rx / DC Orders ED Discharge Orders     None         Gloris Manchester, MD 08/03/22 [id number]    Gloris Manchester, MD 08/03/22 [phone]

## 2022-08-03 NOTE — ED Triage Notes (Signed)
Pt to ED via [facility] from home after 2 witnessed seizures this morning. EMS states they were told the seizures were 5-10 minutes apart and woke pt from sleep. Post-ictal upon EMS arrival, a/o x4 on ED arrival. EMS states no seizure activity en route.

## 2022-08-04 LAB — LAMOTRIGINE LEVEL: Lamotrigine Lvl: 4.8 ug/mL (ref 2.0–20.0)

## 2022-08-04 LAB — LEVETIRACETAM LEVEL: Levetiracetam Lvl: 81.5 ug/mL — ABNORMAL HIGH (ref 10.0–40.0)

## 2022-08-07 ENCOUNTER — Telehealth: Payer: Self-pay | Admitting: Neurology

## 2022-08-07 ENCOUNTER — Telehealth: Payer: Self-pay

## 2022-08-07 NOTE — Telephone Encounter (Signed)
F/u 6/21 scheduled.

## 2022-08-07 NOTE — Telephone Encounter (Signed)
Pt was in the ED at [facility] on Sunday. She had 3 or 4 seizure. The ED told her that her levels needed to be checked. Would you need to see her or just labs  please call patient daughter

## 2022-08-07 NOTE — Telephone Encounter (Signed)
Transition Care Management Unsuccessful Follow-up Telephone Call  Date of discharge and from where:  08/03/2022 [facility]  Attempts:  1st Attempt  Reason for unsuccessful TCM follow-up call:  Left voice message  Mileigh Tilley [facility]  [facility] Population Health Community Resource Care Guide   [email or web address]  ?? [phone]   Website: [email or web address]  [email or web address]

## 2022-08-08 ENCOUNTER — Other Ambulatory Visit: Payer: Self-pay | Admitting: Family Medicine

## 2022-08-08 ENCOUNTER — Telehealth: Payer: Self-pay

## 2022-08-08 NOTE — Telephone Encounter (Signed)
Transition Care Management Unsuccessful Follow-up Telephone Call  Date of discharge and from where:  08/03/2022 [facility]  Attempts:  2nd Attempt  Reason for unsuccessful TCM follow-up call:  Left voice message  [name] [place]  [facility] Population Health Community Resource Care Guide   [email or web address]  ?? [phone]   Website: [email or web address]  [email or web address]      

## 2022-08-15 ENCOUNTER — Encounter: Payer: Self-pay | Admitting: Neurology

## 2022-08-15 ENCOUNTER — Ambulatory Visit (INDEPENDENT_AMBULATORY_CARE_PROVIDER_SITE_OTHER): Payer: Medicare Other | Admitting: Neurology

## 2022-08-15 VITALS — BP 160/83 | HR 111 | Ht 65.0 in | Wt 246.6 lb

## 2022-08-15 DIAGNOSIS — G40009 Localization-related (focal) (partial) idiopathic epilepsy and epileptic syndromes with seizures of localized onset, not intractable, without status epilepticus: Secondary | ICD-10-CM | POA: Diagnosis not present

## 2022-08-15 MED ORDER — LAMICTAL 200 MG PO TABS: 200.0000 mg | ORAL_TABLET | Freq: Two times a day (BID) | ORAL | 3 refills | Status: DC

## 2022-08-15 MED ORDER — LEVETIRACETAM ER 750 MG PO TB24: ORAL_TABLET | ORAL | 3 refills | Status: DC

## 2022-08-15 NOTE — Progress Notes (Signed)
NEUROLOGY FOLLOW UP OFFICE NOTE  NADIAH CORBIT [id number] 1955/10/03  HISTORY OF PRESENT ILLNESS: I had the pleasure of seeing Brei Pociask in follow-up in the neurology clinic on 08/15/2022.  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 She is alone in the office today. Records and images were personally reviewed where available.  In March 2024, her PCP contacted me about a seizure on 3/6, and daughter was instructed to increase Lamotrigine to 150mg  BID. She was in the ER on 08/03/22 for recurrent seizures. A friend had witnessed 2 nocturnal seizures, no tongue bite or incontinence. Her right side felt weaker after. She reported compliance to Keppra and Lamictal. While having a head CT, she was witnessed to have a GTC lasting 1 minute. She was given IV Ativan and Keppra. Lamotrigine level was 4.7, Keppra level was 81.5. She denies any side effects on medications. She feels the seizure occurred due to Augmentin that she took for 7 days for right otitis media prescribed on 5/30. She denies any further seizures since then. No olfactory/gustatory hallucinations, focal numbness/tingling/weakness, myoclonic jerks. She lives alone and takes transportation for appointments. She notes she is "weak in the stomach," denies any nausea/vomiting. No headaches, dizziness, vision changes. She is sleeping okay, no daytime drowsiness. Mood is okay.   History on Initial Assessment 01/22/2022: This is a 67 year old right-handed woman with a history of hypertension, hyperlipidemia, hypothyroidism, atrial fibrillation on Eliquis, partial nephrectomy, mild intellectual disability, presenting to establish care for seizures as Dr. Gerilyn Pilgrim is closing his office. She is a poor historian but able to answer questions adequately. She states seizures started as a baby. She does not recall prior medications she had taken. She reports that she has been having "little ones" that want to come, she feels like  she is dizzy and "going out of my head." She felt this way this morning. She feels this just started when "they changed my medications." Last note from Dr. Ronal Fear office from June 2023 indicate that her Levetiracetam was switched to extended-release. She was in the ER the day prior on 08/12/21 after having 2 seizures at home. Her daughter had previously described seizures as spacing out then collapsing and shaking with tongue bite, sleepiness and disorientation after. She states this was the last time she had a GTC. She has been told she would stare off, the last time she was told was some time this month. Sometimes she notices a metallic taste/dry mouth. She denies any focal numbness/tingling/weakness or myoclonic jerks. She has pain on her right knee from arthritis. She denies any headaches, diplopia, dysarthria/dysphagia, neck/back pain, bowel/bladder dysfunction. She reports sleeping well, then stated she wakes up several times at night, no daytime drowsiness. She lives alone, her daughter lives next door. She has an aide coming daily to helps with meals. Her daughter manages finances. She manages her own medications with a pillpack and denies missing any doses. On her last visit with Dr. Gerilyn Pilgrim, daughter was concerned she was not taking medications properly despite being prepackaged. She is on Levetiracetam ER 750mg  2 tabs BID (1500mg  BID) and Lamotrigine 150mg  1/2 tablet BID (75mg  BID). Per notes, she did not tolerate higher dose of Lamotrigine and was sleepy on immediate-release Levetiracetam.   Epilepsy Risk Factors:  Mild intellectual disability. There is no history of febrile convulsions, CNS infections such as meningitis/encephalitis, significant traumatic brain injury, neurosurgical procedures, or family history of seizures.  Diagnostic Data: I personally reviewed MRI  brain without contrast done 06/2013 with asymmetric atrophy and mildly increased T2 signal in the left hippocampus. Mild chronic  microvascular disease. There was marked, disproportionate cerebellar atrophy.  brain MRI with and without contrast done 01/2022 did not show any acute changes. There was again note of mild asymmetric atrophy and increased T2 signal in the left hippocampus, cerebellar atrophy, and mild chronic microvascular disease.   EEG from 2020 was normal.  EEG in 01/2022 was within normal limits.   Prior ASMs: she did not tolerate higher dose of Lamotrigine, more seizures on generic Lamotrigine, and was sleepy on immediate-release Levetiracetam.   PAST MEDICAL HISTORY: Past Medical History:  Diagnosis Date   A-fib (HCC)    Anemia    Depression    Dyspnea    Fractures    Hyperlipidemia    Hypertension    Mental retardation, mild (I.Q. 50-70)    lives alone and cares for self has a Engineer, civil (consulting) aide comes once a day    Obesity    Pneumonia    PONV (postoperative nausea and vomiting)    Seizure (HCC)    11/17/2012 "still having them; "I believe their from a tumor in my head" ((11/17/2012   Seizure disorder (HCC)    Thyroid cancer (HCC)     MEDICATIONS: Current Outpatient Medications on File Prior to Visit  Medication Sig Dispense Refill   acetaminophen (TYLENOL) 500 MG tablet Take 1 tablet (500 mg total) by mouth 2 (two) times daily. (Patient taking differently: Take 500 mg by mouth 2 (two) times daily as needed for mild pain or headache.) 60 tablet 5   benazepril (LOTENSIN) 20 MG tablet Take 1 tablet (20 mg total) by mouth daily. 30 tablet 5   cinacalcet (SENSIPAR) 30 MG tablet Take 30 mg by mouth daily.     diltiazem (CARDIZEM CD) 180 MG 24 hr capsule Take 1 capsule (180 mg total) by mouth daily. 30 capsule 5   ELIQUIS 5 MG TABS tablet TAKE 1 TABLET BY MOUTH TWICE DAILY. 60 tablet 0   ezetimibe (ZETIA) 10 MG tablet Take 1 tablet (10 mg total) by mouth daily. 30 tablet 5   FARXIGA 5 MG TABS tablet Take 5 mg by mouth every morning.     hydrALAZINE (APRESOLINE) 50 MG tablet Take 50 mg by mouth 2 (two)  times daily.     LAMICTAL 150 MG tablet Take 1 tablet twice a day 90 tablet 3   levETIRAcetam (KEPPRA XR) 750 MG 24 hr tablet Take 2 tablets twice a day 360 tablet 3   levothyroxine (SYNTHROID) 125 MCG tablet Take 1 tablet (125 mcg total) by mouth daily. 90 tablet 3   montelukast (SINGULAIR) 10 MG tablet TAKE (1) TABLET BY MOUTH AT BEDTIME. 30 tablet 5   omeprazole (PRILOSEC) 20 MG capsule TAKE ONE CAPSULE BY MOUTH ONCE DAILY. 30 capsule 0   potassium chloride (KLOR-CON) 10 MEQ tablet Take 10 mEq by mouth daily.     rosuvastatin (CRESTOR) 40 MG tablet Take 1 tablet (40 mg total) by mouth daily. 30 tablet 5   UNABLE TO FIND [facility] Bath chair Bath mat 1 each 0   No current facility-administered medications on file prior to visit.    ALLERGIES: Allergies  Allergen Reactions   Simvastatin Other (See Comments)    Muscle aches    FAMILY HISTORY: Family History  Problem Relation Age of Onset   Diabetes Mother    Colon cancer Neg Hx     SOCIAL HISTORY: Social History  Socioeconomic History   Marital status: Single    Spouse name: Not on file   Number of children: 2   Years of education: Not on file   Highest education level: Not on file  Occupational History   Occupation: disabled   Tobacco Use   Smoking status: Never   Smokeless tobacco: Never  Vaping Use   Vaping Use: Never used  Substance and Sexual Activity   Alcohol use: No   Drug use: No   Sexual activity: Never  Other Topics Concern   Not on file  Social History Narrative   Right handed   Caffeine intake yes   Retired   Lives alone   Social Determinants of Health   Financial Resource Strain: Low Risk  (06/13/2020)   Overall Financial Resource Strain (CARDIA)    Difficulty of Paying Living Expenses: Not hard at all  Food Insecurity: No Food Insecurity (06/13/2020)   Hunger Vital Sign    Worried About Running Out of Food in the Last Year: Never true    Ran Out of Food in the Last Year: Never true   Transportation Needs: No Transportation Needs (06/13/2020)   PRAPARE - Administrator, Civil Service (Medical): No    Lack of Transportation (Non-Medical): No  Physical Activity: Sufficiently Active (06/13/2020)   Exercise Vital Sign    Days of Exercise per Week: 7 days    Minutes of Exercise per Session: 60 min  Stress: No Stress Concern Present (06/13/2020)   [employer] of Occupational Health - Occupational Stress Questionnaire    Feeling of Stress : Not at all  Social Connections: Socially Isolated (06/13/2020)   Social Connection and Isolation Panel [NHANES]    Frequency of Communication with Friends and Family: More than three times a week    Frequency of Social Gatherings with Friends and Family: Twice a week    Attends Religious Services: Never    Database administrator or Organizations: No    Attends Banker Meetings: Never    Marital Status: Never married  Intimate Partner Violence: Not At Risk (06/13/2020)   Humiliation, Afraid, Rape, and Kick questionnaire    Fear of Current or Ex-Partner: No    Emotionally Abused: No    Physically Abused: No    Sexually Abused: No     PHYSICAL EXAM: Vitals:   08/15/22 1245  BP: (!) 143/57  Pulse: (!) 111  SpO2: 97%   General: No acute distress Head:  Normocephalic/atraumatic Skin/Extremities: No rash, no edema Neurological Exam: alert and awake. No aphasia or dysarthria. Fund of knowledge is appropriate. Attention and concentration are normal.   Cranial nerves: Pupils equal, round. Extraocular movements intact with no nystagmus. Visual fields full.  No facial asymmetry.  Motor: Bulk and tone normal, muscle strength 5/5 throughout with no pronator drift.   Finger to nose testing intact.  Gait slow and cautious, no ataxia. No tremors.    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 MRI brain showed asymmetric atrophy and increased signal in the left hippocampus, EEG normal. She had a seizure in March,then 2 nocturnal seizures on 07/30/22. We discussed increasing Lamictal to 200mg  BID, continue Levetiracetam ER 1500mg  BID. She had an ear infection recently which may have lowered seizure threshold. She does not drive. Follow-up in 3 months, call for any changes.  Thank you for allowing me to participate in her care.  Please do not hesitate to call for any questions or concerns.    Patrcia Dolly, M.D.   CC: Dr. Lodema Hong

## 2022-08-15 NOTE — Patient Instructions (Addendum)
Good to see you.  Increase Lamictal to 200mg  tablet: take 1 tablet in AM, 1 tablet in PM  2. Continue Levetiracetam ER (Keppra) 750mg : take 2 tablets in AM, 2 tablets in PM  3. Follow-up in 3 months, call for any changes   Seizure Precautions: 1. If medication has been prescribed for you to prevent seizures, take it exactly as directed.  Do not stop taking the medicine without talking to your doctor first, even if you have not had a seizure in a long time.   2. Avoid activities in which a seizure would cause danger to yourself or to others.  Don't operate dangerous machinery, swim alone, or climb in high or dangerous places, such as on ladders, roofs, or girders.  Do not drive unless your doctor says you may.  3. If you have any warning that you may have a seizure, lay down in a safe place where you can't hurt yourself.    4.  No driving for 6 months from last seizure, as per [facility].   Please refer to the following link on the Epile[employer] of America's website for more information: http://www.epilepsyfoundation.org/answerplace/Social/driving/drivingu.cfm   5.  Maintain good sleep hygiene.   6.  Contact your doctor if you have any problems that may be related to the medicine you are taking.  7.  Call 911 and bring the patient back to the ED if:        A.  The seizure lasts longer than 5 minutes.       B.  The patient doesn't awaken shortly after the seizure  C.  The patient has new problems such as difficulty seeing, speaking or moving  D.  The patient was injured during the seizure  E.  The patient has a temperature over 102 F (39C)  F.  The patient vomited and now is having trouble breathing

## 2022-08-27 ENCOUNTER — Other Ambulatory Visit (HOSPITAL_COMMUNITY): Payer: Self-pay

## 2022-08-27 ENCOUNTER — Telehealth: Payer: Self-pay | Admitting: Pharmacy Technician

## 2022-09-02 ENCOUNTER — Encounter: Payer: Self-pay | Admitting: Family Medicine

## 2022-09-02 ENCOUNTER — Ambulatory Visit (INDEPENDENT_AMBULATORY_CARE_PROVIDER_SITE_OTHER): Payer: Medicare Other | Admitting: Family Medicine

## 2022-09-02 VITALS — BP 148/86 | HR 85 | Ht 65.0 in | Wt 244.0 lb

## 2022-09-02 DIAGNOSIS — N1832 Chronic kidney disease, stage 3b: Secondary | ICD-10-CM

## 2022-09-02 DIAGNOSIS — I10 Essential (primary) hypertension: Secondary | ICD-10-CM | POA: Diagnosis not present

## 2022-09-02 DIAGNOSIS — E785 Hyperlipidemia, unspecified: Secondary | ICD-10-CM | POA: Diagnosis not present

## 2022-09-02 DIAGNOSIS — Z1231 Encounter for screening mammogram for malignant neoplasm of breast: Secondary | ICD-10-CM | POA: Diagnosis not present

## 2022-09-02 DIAGNOSIS — E038 Other specified hypothyroidism: Secondary | ICD-10-CM

## 2022-09-02 DIAGNOSIS — R569 Unspecified convulsions: Secondary | ICD-10-CM

## 2022-09-02 DIAGNOSIS — I4891 Unspecified atrial fibrillation: Secondary | ICD-10-CM

## 2022-09-02 MED ORDER — OLMESARTAN MEDOXOMIL 40 MG PO TABS: 40.0000 mg | ORAL_TABLET | Freq: Every day | ORAL | 2 refills | Status: DC

## 2022-09-02 NOTE — Patient Instructions (Addendum)
F/u 2  months, call if you need me sooner, recheck BP, please bring meds to next visit  Lipi,d cmp and EGFR today  Blood pressure is still high, the benazepril  20 mg tablet is being changed to olmesartan 40 mg on daily when next mediations are sent out ( Nurse pls speak with pharmacist also)  Please schedule mammogram at checkout  It is important that you exercise regularly at least 30 minutes 5 times a week. If you develop chest pain, have severe difficulty breathing, or feel very tired, stop exercising immediately and seek medical attention   Think about what you will eat, plan ahead. Choose " clean, green, fresh or frozen" over canned, processed or packaged foods which are more sugary, salty and fatty. 70 to 75% of food eaten should be vegetables and fruit. Three meals at set times with snacks allowed between meals, but they must be fruit or vegetables. Aim to eat over a 12 hour period , example 7 am to 7 pm, and STOP after  your last meal of the day. Drink water,generally about 64 ounces per day, no other drink is as healthy. Fruit juice is best enjoyed in a healthy way, by EATING the fruit.   Thanks for choosing [facility], we consider it a privelige to serve you.

## 2022-09-03 ENCOUNTER — Other Ambulatory Visit (HOSPITAL_COMMUNITY): Payer: Self-pay

## 2022-09-03 ENCOUNTER — Encounter: Payer: Self-pay | Admitting: Family Medicine

## 2022-09-03 LAB — LIPID PANEL
Chol/HDL Ratio: 2.7 ratio (ref 0.0–4.4)
Cholesterol, Total: 159 mg/dL (ref 100–199)
HDL: 60 mg/dL (ref >39)
LDL Chol Calc (NIH): 88 mg/dL (ref 0–99)
Triglycerides: 56 mg/dL (ref 0–149)
VLDL Cholesterol Cal: 11 mg/dL (ref 5–40)

## 2022-09-03 LAB — CMP14+EGFR
ALT: 14 IU/L (ref 0–32)
AST: 18 IU/L (ref 0–40)
Albumin: 4.3 g/dL (ref 3.9–4.9)
Alkaline Phosphatase: 164 IU/L — ABNORMAL HIGH (ref 44–121)
BUN/Creatinine Ratio: 15 (ref 12–28)
BUN: 29 mg/dL — ABNORMAL HIGH (ref 8–27)
Bilirubin Total: 0.5 mg/dL (ref 0.0–1.2)
CO2: 21 mmol/L (ref 20–29)
Calcium: 9.2 mg/dL (ref 8.7–10.3)
Chloride: 104 mmol/L (ref 96–106)
Creatinine, Ser: 1.96 mg/dL — ABNORMAL HIGH (ref 0.57–1.00)
Globulin, Total: 3.3 g/dL (ref 1.5–4.5)
Glucose: 86 mg/dL (ref 70–99)
Potassium: 4.3 mmol/L (ref 3.5–5.2)
Sodium: 144 mmol/L (ref 134–144)
Total Protein: 7.6 g/dL (ref 6.0–8.5)
eGFR: 27 mL/min/{1.73_m2} — ABNORMAL LOW (ref >59)

## 2022-09-03 MED ORDER — LAMICTAL 200 MG PO TABS: 200.0000 mg | ORAL_TABLET | Freq: Two times a day (BID) | ORAL | 6 refills | Status: DC

## 2022-09-03 NOTE — Telephone Encounter (Signed)
Refill sent in for pt. 

## 2022-09-03 NOTE — Assessment & Plan Note (Signed)
  Patient re-educated about  the importance of commitment to a  minimum of 150 minutes of exercise per week as able.  The importance of healthy food choices with portion control discussed, as well as eating regularly and within a 12 hour window most days. The need to choose "clean , green" food 50 to 75% of the time is discussed, as well as to make water the primary drink and set a goal of 64 ounces water daily.       09/02/2022   10:05 AM 08/15/2022   12:45 PM 08/03/2022    5:09 AM  Weight /BMI  Weight 244 lb 0.6 oz 246 lb 9.6 oz 247 lb  Height 5\' 5"  (1.651 m) 5\' 5"  (1.651 m) 5\' 5"  (1.651 m)  BMI 40.61 kg/m2 41.04 kg/m2 41.1 kg/m2    Unchnaged , nees to work on weight loss

## 2022-09-03 NOTE — Progress Notes (Signed)
[name]     MRN: [id number]      DOB: 1956-02-13  Chief Complaint  Patient presents with   Hypertension    Follow up    HPI [name] is here for follow up and re-evaluation of chronic medical conditions, medication management and review of any available recent lab and radiology data.  Preventive health is updated, specifically  Cancer screening and Immunization.   Questions or concerns regarding consultations or procedures which the PT has had in the interim are  addressed. The PT denies any adverse reactions to current medications since the last visit.  There are no new concerns.  There are no specific complaints   ROS Denies recent fever or chills. Denies sinus pressure, nasal congestion, ear pain or sore throat. Denies chest congestion, productive cough or wheezing. Denies chest pains, palpitations and leg swelling Denies abdominal pain, nausea, vomiting,diarrhea or constipation.   Denies dysuria, frequency, hesitancy or incontinence. Denies joint pain, swelling and limitation in mobility. Denies headaches, seizures, numbness, or tingling. Denies depression, anxiety or insomnia. Denies skin break down or rash.   PE  BP (!) 148/86   Pulse 85   Ht 5\' 5"  (1.651 m)   Wt 244 lb 0.6 oz (110.7 kg)   SpO2 93%   BMI 40.61 kg/m   Patient alert and oriented and in no cardiopulmonary distress.  HEENT: No facial asymmetry, EOMI,     Neck supple .  Chest: Clear to auscultation bilaterally.  CVS: S1, S2 no murmurs, no S3.Regular rate.  ABD: Soft non tender.   Ext: No edema  MS: Adequate ROM spine, shoulders, hips and knees.  Skin: Intact, no ulcerations or rash noted.  Psych: Good eye contact, normal affect. Memory intact not anxious or depressed appearing.  CNS: CN 2-12 intact, power,  normal throughout.no focal deficits noted.   Assessment & Plan  Seizures (HCC) Recent seizure felt to be due to interaction with antibiotic she was prescribed for ear  infection, has seen Neurology since the episode , no ned adjustment made in epileptic medication  Morbid obesity (HCC)  Patient re-educated about  the importance of commitment to a  minimum of 150 minutes of exercise per week as able.  The importance of healthy food choices with portion control discussed, as well as eating regularly and within a 12 hour window most days. The need to choose "clean , green" food 50 to 75% of the time is discussed, as well as to make water the primary drink and set a goal of 64 ounces water daily.       09/02/2022   10:05 AM 08/15/2022   12:45 PM 08/03/2022    5:09 AM  Weight /BMI  Weight 244 lb 0.6 oz 246 lb 9.6 oz 247 lb  Height 5\' 5"  (1.651 m) 5\' 5"  (1.651 m) 5\' 5"  (1.651 m)  BMI 40.61 kg/m2 41.04 kg/m2 41.1 kg/m2    Unchnaged , nees to work on weight loss  Hypothyroidism Managed by Endo and controlled   Essential hypertension Uncontrolled , stop benazepril and star olmesartan 40 mg DASH diet and commitment to daily physical activity for a minimum of 30 minutes discussed and encouraged, as a part of hypertension management. The importance of attaining a healthy weight is also discussed.     09/02/2022   10:36 AM 09/02/2022   10:07 AM 09/02/2022   10:05 AM 08/15/2022    1:29 PM 08/15/2022   12:45 PM 08/03/2022    9:18 AM 08/03/2022  5:45 AM  BP/Weight  Systolic BP 148 140 146 160 143 145 147  Diastolic BP 86 77 75 83 57 75 77  Wt. (Lbs)   244.04  246.6    BMI   40.61 kg/m2  41.04 kg/m2         Atrial fibrillation with RVR (HCC) Maintained on eliquis, rate is controlled  CKD (chronic kidney disease) stage 3, GFR 30-59 ml/min Stable and followed by Nephrology

## 2022-09-03 NOTE — Assessment & Plan Note (Signed)
Uncontrolled , stop benazepril and star olmesartan 40 mg DASH diet and commitment to daily physical activity for a minimum of 30 minutes discussed and encouraged, as a part of hypertension management. The importance of attaining a healthy weight is also discussed.     09/02/2022   10:36 AM 09/02/2022   10:07 AM 09/02/2022   10:05 AM 08/15/2022    1:29 PM 08/15/2022   12:45 PM 08/03/2022    9:18 AM 08/03/2022    5:45 AM  BP/Weight  Systolic BP 148 140 146 160 143 145 147  Diastolic BP 86 77 75 83 57 75 77  Wt. (Lbs)   244.04  246.6    BMI   40.61 kg/m2  41.04 kg/m2

## 2022-09-03 NOTE — Assessment & Plan Note (Signed)
Stable and followed by Nephrology 

## 2022-09-03 NOTE — Assessment & Plan Note (Signed)
Recent seizure felt to be due to interaction with antibiotic she was prescribed for ear infection, has seen Neurology since the episode , no ned adjustment made in epileptic medication

## 2022-09-03 NOTE — Telephone Encounter (Signed)
Patient Advocate Encounter  Prior Authorization for LAMICTAL 200MG  has been approved with [employer].    PA# -- Effective dates: 7.10.24 through 12.31.24  Per [facility] test claim, copay for 30 days supply is $0

## 2022-09-03 NOTE — Assessment & Plan Note (Signed)
Maintained on eliquis, rate is controlled 

## 2022-09-03 NOTE — Telephone Encounter (Signed)
Patient Advocate Encounter  Received notification from [facility] that prior authorization for LAMICTAL 200 is required.   PA submitted on 7.10.24 VIA PHONE CASE/REFERENCE # [id number] Status is    CMM said PA was not needed. Pt still unable to process at pharmacy level. Called and completed PA over the phone.

## 2022-09-03 NOTE — Assessment & Plan Note (Signed)
Managed by Endo and controlled 

## 2022-09-08 ENCOUNTER — Other Ambulatory Visit: Payer: Self-pay | Admitting: Family Medicine

## 2022-09-30 NOTE — Patient Instructions (Signed)

## 2022-10-02 ENCOUNTER — Ambulatory Visit (INDEPENDENT_AMBULATORY_CARE_PROVIDER_SITE_OTHER): Payer: Medicare Other | Admitting: Nurse Practitioner

## 2022-10-02 ENCOUNTER — Encounter: Payer: Self-pay | Admitting: Nurse Practitioner

## 2022-10-02 VITALS — BP 117/76 | HR 81 | Ht 65.0 in | Wt 246.0 lb

## 2022-10-02 DIAGNOSIS — E890 Postprocedural hypothyroidism: Secondary | ICD-10-CM | POA: Diagnosis not present

## 2022-10-02 DIAGNOSIS — C73 Malignant neoplasm of thyroid gland: Secondary | ICD-10-CM | POA: Diagnosis not present

## 2022-10-02 MED ORDER — LEVOTHYROXINE SODIUM 125 MCG PO TABS
125.0000 ug | ORAL_TABLET | Freq: Every day | ORAL | 3 refills | Status: DC
Start: 2022-10-02 — End: 2022-10-17

## 2022-10-02 NOTE — Progress Notes (Signed)
10/02/2022      Endocrinology follow-up note     Subjective:    Patient ID: [name], female    DOB: August 22, 1955,    Past Medical History:  Diagnosis Date   A-fib (HCC)    Anemia    Depression    Dyspnea    Fractures    Hyperlipidemia    Hypertension    Mental retardation, mild (I.Q. 50-70)    lives alone and cares for self has a nurse aide comes once a day    Obesity    Pneumonia    PONV (postoperative nausea and vomiting)    Seizure (HCC)    11/17/2012 "still having them; "I believe their from a tumor in my head" ((11/17/2012   Seizure disorder (HCC)    Thyroid cancer [facility])    Past Surgical History:  Procedure Laterality Date   ABDOMINAL HYSTERECTOMY  2001   BREAST LUMPECTOMY  1996   right    COLONOSCOPY N/A 05/16/2016   Dr. Darrick Penna: Redundant colon, hemorrhoids, next colonoscopy 10 years   INGUINAL HERNIA REPAIR N/A 08/12/2019   Procedure: laparoscopic incisional hernia repair;  Surgeon: Crista Elliot, MD;  Location: [facility];  Service: Urology;  Laterality: N/A;   ROBOTIC ASSITED PARTIAL NEPHRECTOMY Right 08/05/2019   Procedure: XI ROBOTIC ASSITED PARTIAL NEPHRECTOMY WITH INTRAOPERATIVE ULTRASOUND;  Surgeon: Sebastian Ache, MD;  Location: [facility];  Service: Urology;  Laterality: Right;  3 HRS   THYROIDECTOMY Bilateral 11/17/2012   Procedure: TOTAL THYROIDECTOMY;  Surgeon: Darletta Moll, MD;  Location: [facility];  Service: ENT;  Laterality: Bilateral;   TOTAL THYROIDECTOMY Bilateral 11/17/2012   TUBAL LIGATION     Social History   Socioeconomic History   Marital status: Single    Spouse name: Not on file   Number of children: 2   Years of education: Not on file   Highest education level: Not on file  Occupational History   Occupation: disabled   Tobacco Use   Smoking status: Never   Smokeless tobacco: Never  Vaping Use   Vaping status: Never Used  Substance and Sexual Activity   Alcohol use: No   Drug use: No   Sexual activity: Never  Other Topics  Concern   Not on file  Social History Narrative   Right handed   Caffeine intake yes   Retired   Lives alone   Social Determinants of Health   Financial Resource Strain: Low Risk  (06/13/2020)   Overall Financial Resource Strain (CARDIA)    Difficulty of Paying Living Expenses: Not hard at all  Food Insecurity: No Food Insecurity (06/13/2020)   Hunger Vital Sign    Worried About Running Out of Food in the Last Year: Never true    Ran Out of Food in the Last Year: Never true  Transportation Needs: No Transportation Needs (06/13/2020)   PRAPARE - Administrator, Civil Service (Medical): No    Lack of Transportation (Non-Medical): No  Physical Activity: Sufficiently Active (06/13/2020)   Exercise Vital Sign    Days of Exercise per Week: 7 days    Minutes of Exercise per Session: 60 min  Stress: No Stress Concern Present (06/13/2020)   [employer] of Occupational Health - Occupational Stress Questionnaire    Feeling of Stress : Not at all  Social Connections: Socially Isolated (06/13/2020)   Social Connection and Isolation Panel [NHANES]    Frequency of Communication with Friends and Family: More than three times  a week    Frequency of Social Gatherings with Friends and Family: Twice a week    Attends Religious Services: Never    Database administrator or Organizations: No    Attends Banker Meetings: Never    Marital Status: Never married   Outpatient Encounter Medications as of 10/02/2022  Medication Sig   acetaminophen (TYLENOL) 500 MG tablet Take 1 tablet (500 mg total) by mouth 2 (two) times daily. (Patient taking differently: Take 500 mg by mouth 2 (two) times daily as needed for mild pain or headache.)   cinacalcet (SENSIPAR) 30 MG tablet Take 30 mg by mouth daily.   diltiazem (CARDIZEM CD) 180 MG 24 hr capsule Take 1 capsule (180 mg total) by mouth daily.   ELIQUIS 5 MG TABS tablet TAKE 1 TABLET BY MOUTH TWICE DAILY.   ezetimibe (ZETIA) 10 MG  tablet Take 1 tablet (10 mg total) by mouth daily.   FARXIGA 5 MG TABS tablet Take 5 mg by mouth every morning.   hydrALAZINE (APRESOLINE) 50 MG tablet Take 50 mg by mouth 2 (two) times daily.   LAMICTAL 200 MG tablet Take 1 tablet (200 mg total) by mouth 2 (two) times daily.   levETIRAcetam (KEPPRA XR) 750 MG 24 hr tablet Take 2 tablets twice a day   montelukast (SINGULAIR) 10 MG tablet TAKE (1) TABLET BY MOUTH AT BEDTIME.   olmesartan (BENICAR) 40 MG tablet Take 1 tablet (40 mg total) by mouth daily.   omeprazole (PRILOSEC) 20 MG capsule TAKE ONE CAPSULE BY MOUTH ONCE DAILY.   potassium chloride (KLOR-CON) 10 MEQ tablet Take 10 mEq by mouth daily.   rosuvastatin (CRESTOR) 40 MG tablet Take 1 tablet (40 mg total) by mouth daily.   UNABLE TO FIND HHS Bath chair Bath mat   [DISCONTINUED] levothyroxine (SYNTHROID) 125 MCG tablet Take 1 tablet (125 mcg total) by mouth daily.   levothyroxine (SYNTHROID) 125 MCG tablet Take 1 tablet (125 mcg total) by mouth daily.   No facility-administered encounter medications on file as of 10/02/2022.   ALLERGIES: Allergies  Allergen Reactions   Simvastatin Other (See Comments)    Muscle aches   VACCINATION STATUS: Immunization History  Administered Date(s) Administered   Fluad Quad(high Dose 65+) 11/29/2019, 11/29/2020, 10/31/2021   H1N1 12/16/2007   Influenza Split 11/24/2011   Influenza Whole 11/30/2006, 12/07/2008, 12/26/2009   Influenza,inj,Quad PF,6+ Mos 11/18/2012, 12/06/2013, 05/23/2015, 01/29/2016, 12/01/2016, 01/18/2018, 12/01/2018   Pneumococcal Conjugate-13 04/03/2014   Pneumococcal Polysaccharide-23 05/10/2019   Td 08/22/2003   Tdap 04/02/2016   Unspecified SARS-COV-2 Vaccination 05/30/2019, 04/20/2020   Zoster Recombinant(Shingrix) 12/05/2020, 05/01/2021    HPI  67 year old female patient with medical history as above.  She is here to follow-up for post surgical hypothyroidism and for follow-up of Papillary thyroid cancer.  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 She has had thyrogen stimulated remnant ablation with negative post therapy WBS in 2014.  - Second Thyrogen Stimulated whole body scan for surveillance shows no scintigraphic evidence of iodine avid metastasis papillary thyroid cancer on 04/18/2016.  - Most recent surveillance imaging with Thyrogen stimulated whole-body scan on September 27, 2018 showed no evidence of tumor recurrence.  -Her ultrasound from November 07, 2019 was consistent with surgical changes with no new sign of recurrence or residual tumor.  Her last thyroid imaging on November 07, 2019 also confirmed stable atrophic heterogenous residual right  thyroid lobe measuring 2.5 cm x 1.2 cm.  Stability documented since 2019.   She is now on Synthroid 125 mcg p.o. daily before breakfast.  -She has steady weight since last visit.  She has no new complaints.     Review of systems  Constitutional: + Minimally fluctuating body weight,  current Body mass index is 40.94 kg/m. , no fatigue, no subjective hyperthermia, no subjective hypothermia Eyes: no blurry vision, no xerophthalmia ENT: no sore throat, no nodules palpated in throat, no dysphagia/odynophagia, no hoarseness Cardiovascular: no chest pain, no shortness of breath, no palpitations, no leg swelling Respiratory: no cough, no shortness of breath Gastrointestinal: no nausea/vomiting/diarrhea Musculoskeletal: no muscle/joint aches Skin: no rashes, no hyperemia Neurological: no tremors, no numbness, no tingling, no dizziness Psychiatric: no depression, no anxiety  Objective:    BP 117/76 (BP Location: Left Arm, Patient Position: Sitting, Cuff Size: Large)   Pulse 81   Ht 5\' 5"  (1.651 m)   Wt 246 lb (111.6 kg)   BMI 40.94 kg/m   Wt Readings from Last 3 Encounters:  10/02/22 246 lb (111.6 kg)  09/02/22 244 lb  0.6 oz (110.7 kg)  08/15/22 246 lb 9.6 oz (111.9 kg)    BP Readings from Last 3 Encounters:  10/02/22 117/76  09/02/22 (!) 148/86  08/15/22 (!) 160/83    Physical Exam- Limited  Constitutional:  Body mass index is 40.94 kg/m. , not in acute distress, normal state of mind Eyes:  EOMI, no exophthalmos Neck: Supple Thyroid: scar from previous thyroidectomy Musculoskeletal: no gross deformities, strength intact in all four extremities, no gross restriction of joint movements Skin:  no rashes, no hyperemia Neurological: no tremor with outstretched hands   Results for orders placed or performed in visit on 09/02/22  Lipid panel  Result Value Ref Range   Cholesterol, Total 159 100 - 199 mg/dL   Triglycerides 56 0 - 149 mg/dL   HDL 60 >13 mg/dL   VLDL Cholesterol Cal 11 5 - 40 mg/dL   LDL Chol Calc (NIH) 88 0 - 99 mg/dL   Chol/HDL Ratio 2.7 0.0 - 4.4 ratio  CMP14+EGFR  Result Value Ref Range   Glucose 86 70 - 99 mg/dL   BUN 29 (H) 8 - 27 mg/dL   Creatinine, Ser 2.44 (H) 0.57 - 1.00 mg/dL   eGFR 27 (L) >01 UU/VOZ/3.66   BUN/Creatinine Ratio 15 12 - 28   Sodium 144 134 - 144 mmol/L   Potassium 4.3 3.5 - 5.2 mmol/L   Chloride 104 96 - 106 mmol/L   CO2 21 20 - 29 mmol/L   Calcium 9.2 8.7 - 10.3 mg/dL   Total Protein 7.6 6.0 - 8.5 g/dL   Albumin 4.3 3.9 - 4.9 g/dL   Globulin, Total 3.3 1.5 - 4.5 g/dL   Bilirubin Total 0.5 0.0 - 1.2 mg/dL   Alkaline Phosphatase 164 (H) 44 - 121 IU/L   AST 18 0 - 40 IU/L   ALT 14 0 - 32 IU/L   Complete Blood Count (Most recent): Lab Results  Component Value Date   WBC 6.8 08/03/2022   HGB 11.0 (L) 08/03/2022   HCT 34.8 (L) 08/03/2022   MCV 85.7 08/03/2022   PLT 186 08/03/2022   Diabetic Labs (most recent): Lab Results  Component Value Date   HGBA1C 5.2 05/26/2017   HGBA1C 5.3 08/10/2012   Lipid Panel     Component Value Date/Time   CHOL 159 09/02/2022 1051   TRIG 56 09/02/2022 1051  HDL 60 09/02/2022 1051   CHOLHDL 2.7  09/02/2022 1051   CHOLHDL 3.3 10/06/2019 0951   VLDL 14 01/29/2016 1109   LDLCALC 88 09/02/2022 1051   LDLCALC 80 10/06/2019 0951    June 01, 2017  IMPRESSION:  Questioned approximately 2.2 cm soft tissue within the right lobectomy resection bed appears similar to the 07/2015 examination.   Note, while nonspecific, this tissue was apparently present at the time of the Thyrogen stimulated I 131 nuclear medicine whole body scan performed 03/2016 which was negative for the presence of iodine avid metastatic thyroid cancer or residual disease, and thus favored to be benign etiology. Clinical correlation is advised.  Thyrogen stimulated whole-body scan September 27, 2018 fINDINGS:  Excreted tracer within colon and urinary bladder. Small amount of retained gastric activity. Pattern is similar to that seen on the previous exam.   No sites of iodine-avid metastatic thyroid cancer identified. No residual tracer accumulation in the neck.   IMPRESSION: No scintigraphic evidence of iodine-avid metastatic thyroid cancer.     Ultrasound of thyroid on November 07, 2019 -Right lobe 2.5 x 1.2 x 0.9 cm, left lobe surgically absent. Stable atrophic heterogeneous residual right thyroid lobe as before. No hypervascularity. No left thyroid bed abnormality. No regional adenopathy.   IMPRESSION: Stable thyroid ultrasound compared 2019 as above.   Latest Reference Range & Units 09/12/20 10:02 03/22/21 08:30 09/24/21 09:22 04/02/22 11:21 08/03/22 04:15 09/29/22 09:19  TSH 0.450 - 4.500 uIU/mL 0.356 (L) 0.200 (L) 0.071 (L) 0.08 (L) 2.451 1.220  T4,Free(Direct) 0.82 - 1.77 ng/dL 1.61 0.96 (H) 0.45 (H) 1.4  1.66  (L): Data is abnormally low (H): Data is abnormally high Assessment & Plan:   1. Hypothyroidism, postsurgical -Her previsit thyroid function tests are consistent with appropriate hormone replacement.  Therefore she will be kept on same dose of thyroid medication for now at Levothyroxine 125  mcg po daily before breakfast.    - We discussed about the correct intake of her thyroid hormone, on empty stomach at fasting, with water, separated by at least 30 minutes from breakfast and other medications,  and separated by more than 4 hours from calcium, iron, multivitamins, acid reflux medications (PPIs). -Patient is made aware of the fact that thyroid hormone replacement is needed for life, dose to be adjusted by periodic monitoring of thyroid function tests.  - Target TSH for her would be between 0.1-0.5.  2. Papillary thyroid carcinoma (HCC) Her surveillance neck /thyroid u/s from 11/17/2013 was c/w surgically absent thyroid, however there is a 0.9 cm left cervical lymph node. See below.  Pt is s/p thyrogen stimulated thyroid remnant ablation with WBS on 01/14/2013 with no evidence of iodine avid metastasis. She has had pT2,Nx,Mx FVPTC, s/p near total thyroidectomy on September 24,2014. She is counseled to maintain high degree of compliance with synthroid therapy with aim of keeping TSH low normal ( 0.1-0.5).  -she is advised on the necessity of follow-ups and imaging studies at least one annually for the next 5 years. - She was sent for core biopsy of 0.9 cm left cervical lymph node last visit. However, this procedure was abandoned due to " no evidence of adenopathy".  The previously noticed  left cervical lymph node was sent to decrease in size to 0.5 cm. Medicaid did not cover surveillance ultrasound. - Second Thyrogen estimated or body scan for surveillance shows no scintigraphic evidence of iodine avid metastasis papillary thyroid cancer on 04/18/2016. -June 01, 2017 surveillance thyroid/neck ultrasound : Unremarkable, see  above.   -She underwent  Thyrogen stimulated whole body scan which is negative for tumor recurrence.   Her recent thyroid/neck ultrasound from September 2021 showed stable atrophic heterogenous residual thyroid lobe measuring 2.5 x1.2 cm observed to have been  stable since 2019.  She will not need any immediate intervention for this at this time.   She had follow up surveillance thyroid ultrasound prior to today's visit which shows no areas of concern.    She is advised to keep close follow-up with Dr. Syliva Overman for primary care needs.    I spent  15  minutes in the care of the patient today including review of labs from Thyroid Function, CMP, and other relevant labs ; imaging/biopsy records (current and previous including abstractions from other facilities); face-to-face time discussing  her lab results and symptoms, medications doses, her options of short and long term treatment based on the latest standards of care / guidelines;   and documenting the encounter.  Laren Boom Sylva  participated in the discussions, expressed understanding, and voiced agreement with the above plans.  All questions were answered to her satisfaction. she is encouraged to contact clinic should she have any questions or concerns prior to her return visit.   Follow up plan: Return in about 1 year (around 10/02/2023) for Thyroid follow up, Previsit labs.  Ronny Bacon, [facility] [address] [place], [place] [address] Phone: [phone] Fax: [phone]   10/02/2022, 10:02 AM

## 2022-10-08 ENCOUNTER — Other Ambulatory Visit: Payer: Self-pay | Admitting: Family Medicine

## 2022-10-09 ENCOUNTER — Ambulatory Visit (INDEPENDENT_AMBULATORY_CARE_PROVIDER_SITE_OTHER): Payer: Medicare Other | Admitting: Internal Medicine

## 2022-10-09 ENCOUNTER — Encounter: Payer: Self-pay | Admitting: Internal Medicine

## 2022-10-09 ENCOUNTER — Ambulatory Visit (HOSPITAL_COMMUNITY)
Admission: RE | Admit: 2022-10-09 | Discharge: 2022-10-09 | Disposition: A | Discharge status: Home / Self Care | Payer: Medicare Other | Source: Ambulatory Visit | Attending: Internal Medicine | Admitting: Internal Medicine

## 2022-10-09 VITALS — BP 138/76 | HR 80 | Ht 65.0 in | Wt 245.8 lb

## 2022-10-09 DIAGNOSIS — W19XXXA Unspecified fall, initial encounter: Secondary | ICD-10-CM | POA: Insufficient documentation

## 2022-10-09 DIAGNOSIS — I10 Essential (primary) hypertension: Secondary | ICD-10-CM | POA: Diagnosis not present

## 2022-10-09 DIAGNOSIS — R569 Unspecified convulsions: Secondary | ICD-10-CM

## 2022-10-09 DIAGNOSIS — R5381 Other malaise: Secondary | ICD-10-CM | POA: Diagnosis not present

## 2022-10-09 DIAGNOSIS — M79642 Pain in left hand: Secondary | ICD-10-CM | POA: Insufficient documentation

## 2022-10-09 DIAGNOSIS — M545 Low back pain, unspecified: Secondary | ICD-10-CM | POA: Diagnosis not present

## 2022-10-09 NOTE — Patient Instructions (Signed)
Please apply ice over the left hand area for swelling and pain.  Please take Tylenol up to 3 times daily as needed for pain.

## 2022-10-09 NOTE — Progress Notes (Signed)
Acute Office Visit  Subjective:    Patient ID: [name], female    DOB: October 10, 1955, 67 y.o.   MRN: [id number]  Chief Complaint  Patient presents with   Fall    Patient fell this morning, her hand and back side are in pain     HPI Patient is in today for an episode of fall this morning around 7 AM.  She was walking towards bathroom and fell backwards.  She denies any prodromal symptoms or flipping due to an object.  Upon further questioning, she reports that she took a wrong turn and could not maintain balance, fell on outstretched hand towards the back. She usually has home health aide, but did not have anyone in the home when she had fall.  She was able to get up on her own.  She reports left hand pain and swelling around the index finger area.  Denies any numbness or tingling of the hand.  She also reports pain in the lower back area, radiating towards right side, dull, worse with movement and better with rest.  Denies any numbness or tingling of the LE, saddle anesthesia, urinary or stool incontinence.  She has taken Tylenol with some relief.     Past Medical History:  Diagnosis Date   A-fib (HCC)    Anemia    Depression    Dyspnea    Fractures    Hyperlipidemia    Hypertension    Mental retardation, mild (I.Q. 50-70)    lives alone and cares for self has a nurse aide comes once a day    Obesity    Pneumonia    PONV (postoperative nausea and vomiting)    Seizure (HCC)    11/17/2012 "still having them; "I believe their from a tumor in my head" ((11/17/2012   Seizure disorder (HCC)    Thyroid cancer [facility])     Past Surgical History:  Procedure Laterality Date   ABDOMINAL HYSTERECTOMY  2001   BREAST LUMPECTOMY  1996   right    COLONOSCOPY N/A 05/16/2016   Dr. Darrick Penna: Redundant colon, hemorrhoids, next colonoscopy 10 years   INGUINAL HERNIA REPAIR N/A 08/12/2019   Procedure: laparoscopic incisional hernia repair;  Surgeon: Crista Elliot, MD;  Location: [facility];   Service: Urology;  Laterality: N/A;   ROBOTIC ASSITED PARTIAL NEPHRECTOMY Right 08/05/2019   Procedure: XI ROBOTIC ASSITED PARTIAL NEPHRECTOMY WITH INTRAOPERATIVE ULTRASOUND;  Surgeon: Sebastian Ache, MD;  Location: [facility];  Service: Urology;  Laterality: Right;  3 HRS   THYROIDECTOMY Bilateral 11/17/2012   Procedure: TOTAL THYROIDECTOMY;  Surgeon: Darletta Moll, MD;  Location: [facility] OR;  Service: ENT;  Laterality: Bilateral;   TOTAL THYROIDECTOMY Bilateral 11/17/2012   TUBAL LIGATION      Family History  Problem Relation Age of Onset   Diabetes Mother    Colon cancer Neg Hx     Social History   Socioeconomic History   Marital status: Single    Spouse name: Not on file   Number of children: 2   Years of education: Not on file   Highest education level: Not on file  Occupational History   Occupation: disabled   Tobacco Use   Smoking status: Never   Smokeless tobacco: Never  Vaping Use   Vaping status: Never Used  Substance and Sexual Activity   Alcohol use: No   Drug use: No   Sexual activity: Never  Other Topics Concern   Not on file  Social  History Narrative   Right handed   Caffeine intake yes   Retired   Lives alone   Social Determinants of Health   Financial Resource Strain: Low Risk  (06/13/2020)   Overall Financial Resource Strain (CARDIA)    Difficulty of Paying Living Expenses: Not hard at all  Food Insecurity: No Food Insecurity (06/13/2020)   Hunger Vital Sign    Worried About Running Out of Food in the Last Year: Never true    Ran Out of Food in the Last Year: Never true  Transportation Needs: No Transportation Needs (06/13/2020)   PRAPARE - Administrator, Civil Service (Medical): No    Lack of Transportation (Non-Medical): No  Physical Activity: Sufficiently Active (06/13/2020)   Exercise Vital Sign    Days of Exercise per Week: 7 days    Minutes of Exercise per Session: 60 min  Stress: No Stress Concern Present (06/13/2020)   [employer]  of Occupational Health - Occupational Stress Questionnaire    Feeling of Stress : Not at all  Social Connections: Socially Isolated (06/13/2020)   Social Connection and Isolation Panel [NHANES]    Frequency of Communication with Friends and Family: More than three times a week    Frequency of Social Gatherings with Friends and Family: Twice a week    Attends Religious Services: Never    Database administrator or Organizations: No    Attends Banker Meetings: Never    Marital Status: Never married  Intimate Partner Violence: Not At Risk (06/13/2020)   Humiliation, Afraid, Rape, and Kick questionnaire    Fear of Current or Ex-Partner: No    Emotionally Abused: No    Physically Abused: No    Sexually Abused: No    Outpatient Medications Prior to Visit  Medication Sig Dispense Refill   acetaminophen (TYLENOL) 500 MG tablet Take 1 tablet (500 mg total) by mouth 2 (two) times daily. (Patient taking differently: Take 500 mg by mouth 2 (two) times daily as needed for mild pain or headache.) 60 tablet 5   CARTIA XT 180 MG 24 hr capsule TAKE 1 CAPSULE BY MOUTH ONCE DAILY. 30 capsule 0   cinacalcet (SENSIPAR) 30 MG tablet Take 30 mg by mouth daily.     ELIQUIS 5 MG TABS tablet TAKE 1 TABLET BY MOUTH TWICE DAILY. 60 tablet 0   ezetimibe (ZETIA) 10 MG tablet TAKE ONE TABLET BY MOUTH ONCE DAILY. 30 tablet 0   FARXIGA 5 MG TABS tablet Take 5 mg by mouth every morning.     hydrALAZINE (APRESOLINE) 50 MG tablet Take 50 mg by mouth 2 (two) times daily.     LAMICTAL 200 MG tablet Take 1 tablet (200 mg total) by mouth 2 (two) times daily. 60 tablet 6   levETIRAcetam (KEPPRA XR) 750 MG 24 hr tablet Take 2 tablets twice a day 360 tablet 3   levothyroxine (SYNTHROID) 125 MCG tablet Take 1 tablet (125 mcg total) by mouth daily. 90 tablet 3   montelukast (SINGULAIR) 10 MG tablet TAKE (1) TABLET BY MOUTH AT BEDTIME. 30 tablet 0   olmesartan (BENICAR) 40 MG tablet Take 1 tablet (40 mg total) by mouth  daily. 30 tablet 2   omeprazole (PRILOSEC) 20 MG capsule TAKE ONE CAPSULE BY MOUTH ONCE DAILY. 30 capsule 0   potassium chloride (KLOR-CON) 10 MEQ tablet Take 10 mEq by mouth daily.     rosuvastatin (CRESTOR) 40 MG tablet TAKE 1 TABLET BY MOUTH ONCE A DAY.  30 tablet 0   UNABLE TO FIND HHS Bath chair Bath mat 1 each 0   No facility-administered medications prior to visit.    Allergies  Allergen Reactions   Simvastatin Other (See Comments)    Muscle aches    Review of Systems  Constitutional:  Negative for chills and fever.  HENT:  Negative for congestion, sinus pressure, sinus pain and sore throat.   Eyes:  Negative for pain and discharge.  Respiratory:  Negative for cough and shortness of breath.   Cardiovascular:  Negative for chest pain and palpitations.  Gastrointestinal:  Negative for abdominal pain, diarrhea, nausea and vomiting.  Endocrine: Negative for polydipsia and polyuria.  Genitourinary:  Negative for dysuria and hematuria.  Musculoskeletal:  Positive for back pain. Negative for neck pain and neck stiffness.  Skin:  Negative for rash.  Neurological:  Positive for weakness. Negative for dizziness.  Psychiatric/Behavioral:  Negative for agitation and behavioral problems.        Objective:    Physical Exam Vitals reviewed.  Constitutional:      General: She is not in acute distress.    Appearance: She is obese. She is not diaphoretic.  HENT:     Head: Normocephalic and atraumatic.  Eyes:     General: No scleral icterus.    Extraocular Movements: Extraocular movements intact.  Cardiovascular:     Rate and Rhythm: Normal rate and regular rhythm.     Heart sounds: Normal heart sounds. No murmur heard. Pulmonary:     Breath sounds: Normal breath sounds. No wheezing or rales.  Musculoskeletal:     Cervical back: Neck supple. No tenderness.     Right lower leg: No edema.     Left lower leg: No edema.  Skin:    General: Skin is warm.     Findings: Bruising  (Mild, over index finger of left hand) present. No rash.  Neurological:     General: No focal deficit present.     Mental Status: She is alert and oriented to person, place, and time.     Motor: Weakness (B/l LE  - 4/5) present.  Psychiatric:        Mood and Affect: Mood normal.        Behavior: Behavior normal.     BP 138/76 (BP Location: Left Arm)   Pulse 80   Ht 5\' 5"  (1.651 m)   Wt 245 lb 12.8 oz (111.5 kg)   SpO2 95%   BMI 40.90 kg/m  Wt Readings from Last 3 Encounters:  10/09/22 245 lb 12.8 oz (111.5 kg)  10/02/22 246 lb (111.6 kg)  09/02/22 244 lb 0.6 oz (110.7 kg)        Assessment & Plan:   Problem List Items Addressed This Visit       Cardiovascular and Mediastinum   Essential hypertension    BP Readings from Last 1 Encounters:  10/09/22 138/76   Well-controlled with olmesartan 40 mg QD, Cartia 180 mg QD and hydralazine 50 mg BID Counseled for compliance with the medications Advised DASH diet and moderate exercise/walking as tolerated  Her blood pressure is well controlled overall, no concern for hypotension related fall/syncope         Other   Seizures (HCC)    On Lamictal She had unwitnessed fall today, but denies any seizure-like activity      Fall - Primary    Appears to be mechanical, denies any head injury Has mild swelling of left hand and low  back pain Check x-ray of left hand and lumbar spine She is on Eliquis, but does not have any apparent bruising except on left hand mild bruising      Relevant Orders   DG Hand Complete Left   DG Lumbar Spine Complete   Physical deconditioning    Has history of polyneuropathy and seizures according to chart review Has difficulty getting up from the sitting position due to body habitus, would benefit from lift chair to avoid fall Advised to contact Medicaid caseworker to fax the lift chair order        No orders of the defined types were placed in this encounter.    Anabel Halon, MD

## 2022-10-09 NOTE — Assessment & Plan Note (Signed)
On Lamictal She had unwitnessed fall today, but denies any seizure-like activity

## 2022-10-09 NOTE — Assessment & Plan Note (Signed)
Appears to be mechanical, denies any head injury Has mild swelling of left hand and low back pain Check x-ray of left hand and lumbar spine She is on Eliquis, but does not have any apparent bruising except on left hand mild bruising

## 2022-10-09 NOTE — Assessment & Plan Note (Addendum)
BP Readings from Last 1 Encounters:  10/09/22 138/76   Well-controlled with olmesartan 40 mg QD, Cartia 180 mg QD and hydralazine 50 mg BID Counseled for compliance with the medications Advised DASH diet and moderate exercise/walking as tolerated  Her blood pressure is well controlled overall, no concern for hypotension related fall/syncope

## 2022-10-09 NOTE — Assessment & Plan Note (Addendum)
Has history of polyneuropathy and seizures according to chart review Has difficulty getting up from the sitting position due to body habitus, would benefit from lift chair to avoid fall Advised to contact Medicaid caseworker to fax the lift chair order

## 2022-10-17 ENCOUNTER — Other Ambulatory Visit: Payer: Self-pay

## 2022-10-17 DIAGNOSIS — E890 Postprocedural hypothyroidism: Secondary | ICD-10-CM

## 2022-10-17 DIAGNOSIS — C73 Malignant neoplasm of thyroid gland: Secondary | ICD-10-CM

## 2022-10-17 MED ORDER — LEVOTHYROXINE SODIUM 125 MCG PO TABS: 125.0000 ug | ORAL_TABLET | Freq: Every day | ORAL | 3 refills | Status: DC

## 2022-11-05 ENCOUNTER — Ambulatory Visit (INDEPENDENT_AMBULATORY_CARE_PROVIDER_SITE_OTHER): Payer: Medicare Other | Admitting: Family Medicine

## 2022-11-05 ENCOUNTER — Encounter: Payer: Self-pay | Admitting: Family Medicine

## 2022-11-05 VITALS — BP 142/78 | HR 88 | Ht 65.0 in | Wt 246.0 lb

## 2022-11-05 DIAGNOSIS — N184 Chronic kidney disease, stage 4 (severe): Secondary | ICD-10-CM

## 2022-11-05 DIAGNOSIS — I4891 Unspecified atrial fibrillation: Secondary | ICD-10-CM

## 2022-11-05 DIAGNOSIS — Z23 Encounter for immunization: Secondary | ICD-10-CM

## 2022-11-05 DIAGNOSIS — I10 Essential (primary) hypertension: Secondary | ICD-10-CM

## 2022-11-05 DIAGNOSIS — E785 Hyperlipidemia, unspecified: Secondary | ICD-10-CM

## 2022-11-05 DIAGNOSIS — R569 Unspecified convulsions: Secondary | ICD-10-CM

## 2022-11-05 DIAGNOSIS — K219 Gastro-esophageal reflux disease without esophagitis: Secondary | ICD-10-CM

## 2022-11-05 DIAGNOSIS — Z6841 Body Mass Index (BMI) 40.0 and over, adult: Secondary | ICD-10-CM

## 2022-11-05 DIAGNOSIS — E038 Other specified hypothyroidism: Secondary | ICD-10-CM

## 2022-11-05 NOTE — Assessment & Plan Note (Signed)
Managed by Endo 

## 2022-11-05 NOTE — Assessment & Plan Note (Signed)
Controlled, no change in medication  

## 2022-11-05 NOTE — Assessment & Plan Note (Signed)
Managed by Nephrology, stable °

## 2022-11-05 NOTE — Assessment & Plan Note (Signed)
  Patient re-educated about  the importance of commitment to a  minimum of 150 minutes of exercise per week as able.  The importance of healthy food choices with portion control discussed, as well as eating regularly and within a 12 hour window most days. The need to choose "clean , green" food 50 to 75% of the time is discussed, as well as to make water the primary drink and set a goal of 64 ounces water daily.       11/05/2022   10:58 AM 10/09/2022    1:48 PM 10/02/2022    9:53 AM  Weight /BMI  Weight 246 lb 0.6 oz 245 lb 12.8 oz 246 lb  Height 5\' 5"  (1.651 m) 5\' 5"  (1.651 m) 5\' 5"  (1.651 m)  BMI 40.94 kg/m2 40.9 kg/m2 40.94 kg/m2    unchanged

## 2022-11-05 NOTE — Assessment & Plan Note (Signed)
Controlled on current regime, managed by Neurology

## 2022-11-05 NOTE — Assessment & Plan Note (Signed)
Hyperlipidemia:Low fat diet discussed and encouraged. Controlled, no change in medication Updated lab needed at/ before next visit.    Lipid Panel  Lab Results  Component Value Date   CHOL 159 09/02/2022   HDL 60 09/02/2022   LDLCALC 88 09/02/2022   TRIG 56 09/02/2022   CHOLHDL 2.7 09/02/2022

## 2022-11-05 NOTE — Assessment & Plan Note (Addendum)
Maintained on eliquis and rate controlled

## 2022-11-05 NOTE — Patient Instructions (Addendum)
F/U end  Feb , call if you need me sooner  Flu  vaccine today  Blood pressure much improved     Please sched AWV at checkout  Best for the rest of 2024 and into 2025!  Thanks for choosing [facility], we consider it a privelige to serve you.

## 2022-11-05 NOTE — Assessment & Plan Note (Signed)
Controlled, no change in medication DASH diet and commitment to daily physical activity for a minimum of 30 minutes discussed and encouraged, as a part of hypertension management. The importance of attaining a healthy weight is also discussed.     11/05/2022   11:41 AM 11/05/2022   11:02 AM 11/05/2022   10:58 AM 10/09/2022    1:55 PM 10/09/2022    1:48 PM 10/02/2022    9:53 AM 09/02/2022   10:36 AM  BP/Weight  Systolic BP 142 142 157 138 144 117 148  Diastolic BP 78 78 84 76 79 76 86  Wt. (Lbs)   246.04  245.8 246   BMI   40.94 kg/m2  40.9 kg/m2 40.94 kg/m2

## 2022-11-05 NOTE — Progress Notes (Signed)
[name]     MRN: [id number]      DOB: Jan 07, 1956  Chief Complaint  Patient presents with   Hypertension    F/u AWV needs to be scheduled.    Vaginal Bleeding    Noticed spotting this morning after bath this morning with cramps.    Immunizations    Flu shot given today. 11/05/22    HPI [name] is here for follow up and re-evaluation of chronic medical conditions, medication management and review of any available recent lab and radiology data.  Preventive health is updated, specifically  Cancer screening and Immunization.   Questions or concerns regarding consultations or procedures which the PT has had in the interim are  addressed. The PT denies any adverse reactions to current medications since the last visit.  Concern as above, single episode, of left lower abdominal pain this am, unable to say where spotting from, possible blood on night gown, small amount ROS Denies recent fever or chills. Denies sinus pressure, nasal congestion, ear pain or sore throat. Denies chest congestion, productive cough or wheezing. Denies chest pains, palpitations and leg swelling Denies abdominal pain, nausea, vomiting,diarrhea or constipation.   Denies dysuria, frequency, hesitancy or incontinence. Denies uncontrolled  joint pain, swelling and limitation in mobility. Denies headaches, seizures, numbness, or tingling. Denies depression, anxiety or insomnia. Denies skin break down or rash.   PE  BP (!) 142/78   Pulse 88   Ht 5\' 5"  (1.651 m)   Wt 246 lb 0.6 oz (111.6 kg)   SpO2 95%   BMI 40.94 kg/m   Patient alert and oriented and in no cardiopulmonary distress.  HEENT: No facial asymmetry, EOMI,     Neck supple .  Chest: Clear to auscultation bilaterally.  CVS: S1, S2 no murmurs, no S3.IrRegular rate.  ABD: Soft non tender.   Ext: No edema  MS: Adequate though reduced  ROM spine, shoulders, hips and knees.  Skin: Intact, no ulcerations or rash  noted.  Psych: Good eye contact, normal affect. Memory intact not anxious or depressed appearing.  CNS: CN 2-12 intact, power,  normal throughout.no focal deficits noted.   Assessment & Plan  Atrial fibrillation with RVR (HCC) Maintained on eliquis and rate controlled   CKD (chronic kidney disease) stage 3, GFR 30-59 ml/min (HCC) Followed by nephrology, stable  CKD (chronic kidney disease) stage 4, GFR 15-29 ml/min (HCC) Managed by Nephrology, stable  GERD (gastroesophageal reflux disease) Controlled, no change in medication   Hyperlipidemia LDL goal <100 Hyperlipidemia:Low fat diet discussed and encouraged. Controlled, no change in medication Updated lab needed at/ before next visit.    Lipid Panel  Lab Results  Component Value Date   CHOL 159 09/02/2022   HDL 60 09/02/2022   LDLCALC 88 09/02/2022   TRIG 56 09/02/2022   CHOLHDL 2.7 09/02/2022       Hypothyroidism Managed by Endo  Seizures (HCC) Controlled on current regime, managed by Neurology  Essential hypertension Controlled, no change in medication DASH diet and commitment to daily physical activity for a minimum of 30 minutes discussed and encouraged, as a part of hypertension management. The importance of attaining a healthy weight is also discussed.     11/05/2022   11:41 AM 11/05/2022   11:02 AM 11/05/2022   10:58 AM 10/09/2022    1:55 PM 10/09/2022    1:48 PM 10/02/2022    9:53 AM 09/02/2022   10:36 AM  BP/Weight  Systolic BP 142 142 157 138 144 117 148  Diastolic BP 78 78 84 76 79 76 86  Wt. (Lbs)   246.04  245.8 246   BMI   40.94 kg/m2  40.9 kg/m2 40.94 kg/m2        Morbid obesity with BMI of 40.0-44.9, adult [facility])  Patient re-educated about  the importance of commitment to a  minimum of 150 minutes of exercise per week as able.  The importance of healthy food choices with portion control discussed, as well as eating regularly and within a 12 hour window most days. The need to choose  "clean , green" food 50 to 75% of the time is discussed, as well as to make water the primary drink and set a goal of 64 ounces water daily.       11/05/2022   10:58 AM 10/09/2022    1:48 PM 10/02/2022    9:53 AM  Weight /BMI  Weight 246 lb 0.6 oz 245 lb 12.8 oz 246 lb  Height 5\' 5"  (1.651 m) 5\' 5"  (1.651 m) 5\' 5"  (1.651 m)  BMI 40.94 kg/m2 40.9 kg/m2 40.94 kg/m2    unchanged

## 2022-11-05 NOTE — Assessment & Plan Note (Signed)
Followed by nephrology, stable.   

## 2022-11-07 ENCOUNTER — Other Ambulatory Visit: Payer: Self-pay

## 2022-11-07 MED ORDER — DILTIAZEM HCL ER COATED BEADS 180 MG PO CP24: 180.0000 mg | ORAL_CAPSULE | Freq: Every day | ORAL | 0 refills | Status: DC

## 2022-11-07 MED ORDER — MONTELUKAST SODIUM 10 MG PO TABS: ORAL_TABLET | ORAL | 0 refills | Status: DC

## 2022-11-07 MED ORDER — OMEPRAZOLE 20 MG PO CPDR: 20.0000 mg | DELAYED_RELEASE_CAPSULE | Freq: Every day | ORAL | 0 refills | Status: DC

## 2022-11-07 MED ORDER — APIXABAN 5 MG PO TABS: 5.0000 mg | ORAL_TABLET | Freq: Two times a day (BID) | ORAL | 0 refills | Status: DC

## 2022-11-07 MED ORDER — EZETIMIBE 10 MG PO TABS: 10.0000 mg | ORAL_TABLET | Freq: Every day | ORAL | 0 refills | Status: DC

## 2022-11-07 MED ORDER — ROSUVASTATIN CALCIUM 40 MG PO TABS: 40.0000 mg | ORAL_TABLET | Freq: Every day | ORAL | 0 refills | Status: DC

## 2022-11-13 ENCOUNTER — Other Ambulatory Visit: Payer: Self-pay | Admitting: Family Medicine

## 2022-11-14 ENCOUNTER — Ambulatory Visit (INDEPENDENT_AMBULATORY_CARE_PROVIDER_SITE_OTHER): Payer: Medicare Other | Admitting: Neurology

## 2022-11-14 ENCOUNTER — Encounter: Payer: Self-pay | Admitting: Neurology

## 2022-11-14 VITALS — BP 137/82 | HR 100 | Ht 65.0 in | Wt 247.6 lb

## 2022-11-14 DIAGNOSIS — G40009 Localization-related (focal) (partial) idiopathic epilepsy and epileptic syndromes with seizures of localized onset, not intractable, without status epilepticus: Secondary | ICD-10-CM | POA: Diagnosis not present

## 2022-11-14 MED ORDER — LEVETIRACETAM ER 750 MG PO TB24: ORAL_TABLET | ORAL | 3 refills | Status: DC

## 2022-11-14 MED ORDER — LAMICTAL 200 MG PO TABS: 200.0000 mg | ORAL_TABLET | Freq: Two times a day (BID) | ORAL | 3 refills | Status: DC

## 2022-11-14 NOTE — Progress Notes (Signed)
NEUROLOGY FOLLOW UP OFFICE NOTE  KELLISHA SLONIKER [id number] 03-16-1955  HISTORY OF PRESENT ILLNESS: I had the pleasure of seeing Malisa Schwenker in follow-up in the neurology clinic on 11/14/2022.  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 She is again alone in the office today. Records and images were personally reviewed where available.  Since her last visit, she denies any seizures since 08/03/22. She is now on brand Lamictal 200mg  BID and Levetiracetam ER 750mg  2 tabs BID (1500mg  BID) without side effects. She has an aide coming 5 days a week to help with meals. She reports a fall in August and another this month, she hit her head with last fall, denies any loss of consciousness. She denies any olfactory/gustatory hallucinations, focal numbness/tingling/weakness, myoclonic jerks. No headaches, dizziness, diplopia. Sleep is okay. She denies any neck/back or knee pain.     History on Initial Assessment 01/22/2022: This is a 67 year old right-handed woman with a history of hypertension, hyperlipidemia, hypothyroidism, atrial fibrillation on Eliquis, partial nephrectomy, mild intellectual disability, presenting to establish care for seizures as Dr. Gerilyn Pilgrim is closing his office. She is a poor historian but able to answer questions adequately. She states seizures started as a baby. She does not recall prior medications she had taken. She reports that she has been having "little ones" that want to come, she feels like she is dizzy and "going out of my head." She felt this way this morning. She feels this just started when "they changed my medications." Last note from Dr. Ronal Fear office from June 2023 indicate that her Levetiracetam was switched to extended-release. She was in the ER the day prior on 08/12/21 after having 2 seizures at home. Her daughter had previously described seizures as spacing out then collapsing and shaking with tongue bite, sleepiness and disorientation  after. She states this was the last time she had a GTC. She has been told she would stare off, the last time she was told was some time this month. Sometimes she notices a metallic taste/dry mouth. She denies any focal numbness/tingling/weakness or myoclonic jerks. She has pain on her right knee from arthritis. She denies any headaches, diplopia, dysarthria/dysphagia, neck/back pain, bowel/bladder dysfunction. She reports sleeping well, then stated she wakes up several times at night, no daytime drowsiness. She lives alone, her daughter lives next door. She has an aide coming daily to helps with meals. Her daughter manages finances. She manages her own medications with a pillpack and denies missing any doses. On her last visit with Dr. Gerilyn Pilgrim, daughter was concerned she was not taking medications properly despite being prepackaged. She is on Levetiracetam ER 750mg  2 tabs BID (1500mg  BID) and Lamotrigine 150mg  1/2 tablet BID (75mg  BID). Per notes, she did not tolerate higher dose of Lamotrigine and was sleepy on immediate-release Levetiracetam.   Epilepsy Risk Factors:  Mild intellectual disability. There is no history of febrile convulsions, CNS infections such as meningitis/encephalitis, significant traumatic brain injury, neurosurgical procedures, or family history of seizures.  Diagnostic Data: I personally reviewed MRI brain without contrast done 06/2013 with asymmetric atrophy and mildly increased T2 signal in the left hippocampus. Mild chronic microvascular disease. There was marked, disproportionate cerebellar atrophy.  brain MRI with and without contrast done 01/2022 did not show any acute changes. There was again note of mild asymmetric atrophy and increased T2 signal in the left hippocampus, cerebellar atrophy, and mild chronic microvascular disease.   EEG from 2020 was normal.  EEG in 01/2022 was within normal limits.   Prior ASMs: she did not tolerate higher dose of Lamotrigine, more seizures  on generic Lamotrigine, and was sleepy on immediate-release Levetiracetam.   PAST MEDICAL HISTORY: Past Medical History:  Diagnosis Date   A-fib (HCC)    Anemia    Depression    Dyspnea    Fractures    Hyperlipidemia    Hypertension    Mental retardation, mild (I.Q. 50-70)    lives alone and cares for self has a Engineer, civil (consulting) aide comes once a day    Obesity    Pneumonia    PONV (postoperative nausea and vomiting)    Seizure (HCC)    11/17/2012 "still having them; "I believe their from a tumor in my head" ((11/17/2012   Seizure disorder (HCC)    Thyroid cancer (HCC)     MEDICATIONS: Current Outpatient Medications on File Prior to Visit  Medication Sig Dispense Refill   acetaminophen (TYLENOL) 500 MG tablet Take 1 tablet (500 mg total) by mouth 2 (two) times daily. (Patient taking differently: Take 500 mg by mouth 2 (two) times daily as needed for mild pain or headache.) 60 tablet 5   apixaban (ELIQUIS) 5 MG TABS tablet Take 1 tablet (5 mg total) by mouth 2 (two) times daily. 60 tablet 0   cinacalcet (SENSIPAR) 30 MG tablet Take 30 mg by mouth daily.     diltiazem (CARTIA XT) 180 MG 24 hr capsule Take 1 capsule (180 mg total) by mouth daily. 30 capsule 0   ezetimibe (ZETIA) 10 MG tablet Take 1 tablet (10 mg total) by mouth daily. 30 tablet 0   FARXIGA 5 MG TABS tablet Take 5 mg by mouth every morning.     FLUoxetine (PROZAC) 10 MG capsule TAKE 1 CAPSULE BY MOUTH ONCE A DAY. 30 capsule 0   hydrALAZINE (APRESOLINE) 50 MG tablet Take 50 mg by mouth 2 (two) times daily.     LAMICTAL 200 MG tablet Take 1 tablet (200 mg total) by mouth 2 (two) times daily. 60 tablet 6   levETIRAcetam (KEPPRA XR) 750 MG 24 hr tablet Take 2 tablets twice a day 360 tablet 3   levothyroxine (SYNTHROID) 125 MCG tablet Take 1 tablet (125 mcg total) by mouth daily. 90 tablet 3   montelukast (SINGULAIR) 10 MG tablet TAKE (1) TABLET BY MOUTH AT BEDTIME. 30 tablet 0   olmesartan (BENICAR) 40 MG tablet Take 1 tablet (40 mg  total) by mouth daily. 30 tablet 2   omeprazole (PRILOSEC) 20 MG capsule TAKE ONE CAPSULE BY MOUTH ONCE DAILY. 30 capsule 0   potassium chloride (KLOR-CON) 10 MEQ tablet Take 10 mEq by mouth daily.     rosuvastatin (CRESTOR) 40 MG tablet Take 1 tablet (40 mg total) by mouth daily. 30 tablet 0   UNABLE TO FIND [facility] Bath chair Bath mat 1 each 0   No current facility-administered medications on file prior to visit.    ALLERGIES: Allergies  Allergen Reactions   Simvastatin Other (See Comments)    Muscle aches    FAMILY HISTORY: Family History  Problem Relation Age of Onset   Diabetes Mother    Colon cancer Neg Hx     SOCIAL HISTORY: Social History   Socioeconomic History   Marital status: Single    Spouse name: Not on file   Number of children: 2   Years of education: Not on file   Highest education level: Not on file  Occupational History  Occupation: disabled   Tobacco Use   Smoking status: Never   Smokeless tobacco: Never  Vaping Use   Vaping status: Never Used  Substance and Sexual Activity   Alcohol use: No   Drug use: No   Sexual activity: Never  Other Topics Concern   Not on file  Social History Narrative   Right handed   Caffeine intake yes   Retired   Lives alone   Social Determinants of Health   Financial Resource Strain: Low Risk  (06/13/2020)   Overall Financial Resource Strain (CARDIA)    Difficulty of Paying Living Expenses: Not hard at all  Food Insecurity: No Food Insecurity (06/13/2020)   Hunger Vital Sign    Worried About Running Out of Food in the Last Year: Never true    Ran Out of Food in the Last Year: Never true  Transportation Needs: No Transportation Needs (06/13/2020)   PRAPARE - Administrator, Civil Service (Medical): No    Lack of Transportation (Non-Medical): No  Physical Activity: Sufficiently Active (06/13/2020)   Exercise Vital Sign    Days of Exercise per Week: 7 days    Minutes of Exercise per Session: 60 min   Stress: No Stress Concern Present (06/13/2020)   [employer] of Occupational Health - Occupational Stress Questionnaire    Feeling of Stress : Not at all  Social Connections: Socially Isolated (06/13/2020)   Social Connection and Isolation Panel [NHANES]    Frequency of Communication with Friends and Family: More than three times a week    Frequency of Social Gatherings with Friends and Family: Twice a week    Attends Religious Services: Never    Database administrator or Organizations: No    Attends Banker Meetings: Never    Marital Status: Never married  Intimate Partner Violence: Not At Risk (06/13/2020)   Humiliation, Afraid, Rape, and Kick questionnaire    Fear of Current or Ex-Partner: No    Emotionally Abused: No    Physically Abused: No    Sexually Abused: No     PHYSICAL EXAM: Vitals:   11/14/22 0957  BP: 137/82  Pulse: 100  SpO2: 97%   General: No acute distress Head:  Normocephalic/atraumatic Skin/Extremities: No rash, no edema Neurological Exam: alert and awake. No aphasia or dysarthria. Fund of knowledge is appropriate.  Attention and concentration are normal.   Cranial nerves: Pupils equal, round. Extraocular movements intact with no nystagmus. Visual fields full.  No facial asymmetry.  Motor: Bulk and tone normal, muscle strength 5/5 throughout with no pronator drift.   Finger to nose testing intact.  Gait slightly wide-based, cautious, no ataxia.    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 MRI brain showed asymmetric atrophy and increased signal in the left hippocampus, EEG normal. She denies any seizures since June 2024, continue brain Lamictal 200mg  BID and Levetiracetam ER 1500mg  BID. She reports 2 falls in the past 2 months that appear mechanical, she now has an  aide coming 5 days a week, continue to monitor. She does not drive. Follow-up in 4 months, call for any changes.   Thank you for allowing me to participate in her care.  Please do not hesitate to call for any questions or concerns.    Patrcia Dolly, M.D.   CC: Dr. Lodema Hong

## 2022-11-14 NOTE — Patient Instructions (Signed)
Good to see you!  Continue Lamictal 200mg : take 1 tablet in AM, 1 tablet in PM  2. Continue Levetiracetam ER (Keppra) 750mg : take 2 tablets in AM, 2  tablets in PM  3. Continue regular exercise with your aide to strengthen leg muscles and hopefully prevent falls  4. Follow-up in 4 months, call for any changes   Seizure Precautions: 1. If medication has been prescribed for you to prevent seizures, take it exactly as directed.  Do not stop taking the medicine without talking to your doctor first, even if you have not had a seizure in a long time.   2. Avoid activities in which a seizure would cause danger to yourself or to others.  Don't operate dangerous machinery, swim alone, or climb in high or dangerous places, such as on ladders, roofs, or girders.  Do not drive unless your doctor says you may.  3. If you have any warning that you may have a seizure, lay down in a safe place where you can't hurt yourself.    4.  No driving for 6 months from last seizure, as per [facility].   Please refer to the following link on the Epilepsy Foundation of [employer]'s website for more information: http://www.epilepsyfoundation.org/answerplace/Social/driving/drivingu.cfm   5.  Maintain good sleep hygiene.   6.  Contact your doctor if you have any problems that may be related to the medicine you are taking.  7.  Call 911 and bring the patient back to the ED if:        A.  The seizure lasts longer than 5 minutes.       B.  The patient doesn't awaken shortly after the seizure  C.  The patient has new problems such as difficulty seeing, speaking or moving  D.  The patient was injured during the seizure  E.  The patient has a temperature over 102 F (39C)  F.  The patient vomited and now is having trouble breathing

## 2022-11-17 ENCOUNTER — Ambulatory Visit: Payer: Medicare Other | Admitting: Neurology

## 2022-11-25 ENCOUNTER — Encounter (HOSPITAL_COMMUNITY): Payer: Self-pay | Admitting: Emergency Medicine

## 2022-11-25 ENCOUNTER — Emergency Department (HOSPITAL_COMMUNITY)
Admission: EM | Admit: 2022-11-25 | Discharge: 2022-11-25 | Disposition: A | Discharge status: Home / Self Care | Payer: Medicare Other | Attending: Emergency Medicine | Admitting: Emergency Medicine

## 2022-11-25 ENCOUNTER — Other Ambulatory Visit: Payer: Self-pay

## 2022-11-25 DIAGNOSIS — Z1152 Encounter for screening for COVID-19: Secondary | ICD-10-CM | POA: Diagnosis not present

## 2022-11-25 DIAGNOSIS — R0981 Nasal congestion: Secondary | ICD-10-CM | POA: Diagnosis not present

## 2022-11-25 DIAGNOSIS — N184 Chronic kidney disease, stage 4 (severe): Secondary | ICD-10-CM | POA: Insufficient documentation

## 2022-11-25 DIAGNOSIS — Z79899 Other long term (current) drug therapy: Secondary | ICD-10-CM | POA: Insufficient documentation

## 2022-11-25 DIAGNOSIS — Z7901 Long term (current) use of anticoagulants: Secondary | ICD-10-CM | POA: Insufficient documentation

## 2022-11-25 DIAGNOSIS — J029 Acute pharyngitis, unspecified: Secondary | ICD-10-CM | POA: Insufficient documentation

## 2022-11-25 LAB — SARS CORONAVIRUS 2 BY RT PCR: SARS Coronavirus 2 by RT PCR: NEGATIVE

## 2022-11-25 LAB — GROUP A STREP BY PCR: Group A Strep by PCR: NOT DETECTED

## 2022-11-25 MED ORDER — FLUTICASONE PROPIONATE 50 MCG/ACT NA SUSP
2.0000 | Freq: Every day | NASAL | 0 refills | Status: DC
Start: 2022-11-25 — End: 2023-10-20

## 2022-11-25 NOTE — ED Triage Notes (Signed)
Pt presents with sore throat, x 2 days, had procedure, possible endo tube?

## 2022-11-25 NOTE — Discharge Instructions (Addendum)
Pleasure taking care of you today.  You were seen for a sore throat and some congestion in your nose.  Your exam was reassuring, your vital signs are normal and your prep test was negative, your COVID test was also negative.  I have prescribed some nasal steroids which will help with the congestion and should also improve the sore throat which is likely from postnasal drip.  You can take over-the-counter Tylenol as needed for pain and to swish and spit with warm salt water for your symptoms as well.  Please come back to the ER if you have any new or worsening symptoms.  Otherwise follow-up closely with your primary care doctor.

## 2022-11-25 NOTE — ED Provider Notes (Signed)
[place] EM[facility] DEPARTMENT AT [facility] Provider Note   CSN: [id number] Arrival date & time: 11/25/22  1147     History  Chief Complaint  Patient presents with   Sore Throat    [name] is a 67 y.o. female.  Has PMH of stage IV CKD, seizures, morbid obesity, mild intellectual disability,   Patient is here with her home health aide today.  Complaining of burning in her throat, she states it has been like this for a year since she had surgery but was bothering her more today, she is able to swallow but states it burns when she swallows she is also had some nasal congestion today.  Denies fever or chills, no chest pain, no shortness of breath.  No cough or wheezing.   Sore Throat       Home Medications Prior to Admission medications   Medication Sig Start Date End Date Taking? Authorizing Provider  fluticasone (FLONASE) 50 MCG/ACT nasal spray Place 2 sprays into both nostrils daily. 11/25/22  Yes Brucha Ahlquist A, PA-C  acetaminophen (TYLENOL) 500 MG tablet Take 1 tablet (500 mg total) by mouth 2 (two) times daily. Patient taking differently: Take 500 mg by mouth 2 (two) times daily as needed for mild pain or headache. 08/15/14   Kerri Perches, MD  apixaban (ELIQUIS) 5 MG TABS tablet Take 1 tablet (5 mg total) by mouth 2 (two) times daily. 11/07/22   Kerri Perches, MD  cinacalcet (SENSIPAR) 30 MG tablet Take 30 mg by mouth daily. 07/08/22   [provider]  diltiazem (CARTIA XT) 180 MG 24 hr capsule Take 1 capsule (180 mg total) by mouth daily. 11/07/22   Kerri Perches, MD  ezetimibe (ZETIA) 10 MG tablet Take 1 tablet (10 mg total) by mouth daily. 11/07/22   Kerri Perches, MD  FARXIGA 5 MG TABS tablet Take 5 mg by mouth every morning. 08/14/21   [provider]  FLUoxetine (PROZAC) 10 MG capsule TAKE 1 CAPSULE BY MOUTH ONCE A DAY. 11/13/22   Kerri Perches, MD  hydrALAZINE (APRESOLINE) 50 MG tablet Take 50 mg by mouth  2 (two) times daily. 04/14/22   [provider]  LAMICTAL 200 MG tablet Take 1 tablet (200 mg total) by mouth 2 (two) times daily. 11/14/22   Van Clines, MD  levETIRAcetam (KEPPRA XR) 750 MG 24 hr tablet Take 2 tablets twice a day 11/14/22   Van Clines, MD  levothyroxine (SYNTHROID) 125 MCG tablet Take 1 tablet (125 mcg total) by mouth daily. 10/17/22   Roma Kayser, MD  montelukast (SINGULAIR) 10 MG tablet TAKE (1) TABLET BY MOUTH AT BEDTIME. 11/07/22   Kerri Perches, MD  olmesartan (BENICAR) 40 MG tablet Take 1 tablet (40 mg total) by mouth daily. 09/02/22   Kerri Perches, MD  omeprazole (PRILOSEC) 20 MG capsule TAKE ONE CAPSULE BY MOUTH ONCE DAILY. 11/13/22   Kerri Perches, MD  potassium chloride (KLOR-CON) 10 MEQ tablet Take 10 mEq by mouth daily. 03/18/21   [provider]  rosuvastatin (CRESTOR) 40 MG tablet Take 1 tablet (40 mg total) by mouth daily. 11/07/22   Kerri Perches, MD  UNABLE TO FIND [facility] Bath chair Bath mat 06/20/20   Kerri Perches, MD      Allergies    Simvastatin    Review of Systems   Review of Systems  Physical Exam Updated Vital Signs BP 139/72   Pulse  69   Temp 98.5 F (36.9 C) (Oral)   Resp 18   Ht 5\' 5"  (1.651 m)   Wt 108.9 kg   SpO2 100%   BMI 39.94 kg/m  Physical Exam Vitals and nursing note reviewed.  Constitutional:      General: She is not in acute distress.    Appearance: She is well-developed.  HENT:     Head: Normocephalic and atraumatic.     Mouth/Throat:     Lips: Lesions present.     Mouth: Mucous membranes are moist.     Palate: No mass and lesions.     Pharynx: Oropharynx is clear. Uvula midline. No pharyngeal swelling or oropharyngeal exudate.  Eyes:     Conjunctiva/sclera: Conjunctivae normal.  Cardiovascular:     Rate and Rhythm: Normal rate and regular rhythm.     Heart sounds: No murmur heard. Pulmonary:     Effort: Pulmonary effort is normal. No respiratory distress.      Breath sounds: Normal breath sounds.  Abdominal:     Palpations: Abdomen is soft.     Tenderness: There is no abdominal tenderness.  Musculoskeletal:        General: No swelling.     Cervical back: Neck supple.  Skin:    General: Skin is warm and dry.     Capillary Refill: Capillary refill takes less than 2 seconds.  Neurological:     General: No focal deficit present.     Mental Status: She is alert and oriented to person, place, and time.  Psychiatric:        Mood and Affect: Mood normal.     ED Results / Procedures / Treatments   Labs (all labs ordered are listed, but only abnormal results are displayed) Labs Reviewed  SARS CORONAVIRUS 2 BY RT PCR  GROUP A STREP BY PCR    EKG None  Radiology No results found.  Procedures Procedures    Medications Ordered in ED Medications - No data to display  ED Course/ Medical Decision Making/ A&P                                 Medical Decision Making Ddx: Pharyngitis, tonsillitis, viral syndrome, postnasal drip, GERD, other  ED course: Patient here with complaint of sore throat today, states she had surgery on her throat a year ago and states she has had pain since then but complaining of worse symptoms today.  She does have nasal congestion as well, no fevers, chandelier's with oral secretions well and speaking in full and clear sentences.  Physical exam is overall reassuring, discussed with patient may be due to postnasal drip due to her congestion.  Will give Flonase, advised on Tylenol for pain, outpatient follow-up and given strict return precautions.  Patient has no fevers, no voice changes, no trouble swallowing or breathing to suggest deep space abscess or mass, no systemic symptoms.  She is not tachycardic febrile or tachypneic to suggest systemic infection.  Do not feel she has further labs or imaging today  Amount and/or Complexity of Data Reviewed Labs: ordered.    Details: COVID and negative strep both  negative           Final Clinical Impression(s) / ED Diagnoses Final diagnoses:  Sore throat    Rx / DC Orders ED Discharge Orders          Ordered    fluticasone (FLONASE) 50  MCG/ACT nasal spray  Daily        11/25/22 1447              Josem Kaufmann 11/25/22 1449    Bethann Berkshire, MD 11/27/22 [phone]

## 2022-12-03 ENCOUNTER — Other Ambulatory Visit: Payer: Self-pay | Admitting: Family Medicine

## 2022-12-11 ENCOUNTER — Other Ambulatory Visit: Payer: Self-pay | Admitting: Family Medicine

## 2022-12-12 ENCOUNTER — Other Ambulatory Visit: Payer: Self-pay | Admitting: Family Medicine

## 2022-12-16 ENCOUNTER — Other Ambulatory Visit: Payer: Self-pay

## 2022-12-16 ENCOUNTER — Emergency Department (HOSPITAL_COMMUNITY): Payer: Medicare Other

## 2022-12-16 ENCOUNTER — Encounter (HOSPITAL_COMMUNITY): Payer: Self-pay

## 2022-12-16 ENCOUNTER — Emergency Department (HOSPITAL_COMMUNITY)
Admission: EM | Admit: 2022-12-16 | Discharge: 2022-12-16 | Disposition: A | Discharge status: Home / Self Care | Payer: Medicare Other | Attending: Student | Admitting: Student

## 2022-12-16 DIAGNOSIS — K5900 Constipation, unspecified: Secondary | ICD-10-CM | POA: Insufficient documentation

## 2022-12-16 DIAGNOSIS — Z7901 Long term (current) use of anticoagulants: Secondary | ICD-10-CM | POA: Insufficient documentation

## 2022-12-16 DIAGNOSIS — N184 Chronic kidney disease, stage 4 (severe): Secondary | ICD-10-CM | POA: Diagnosis not present

## 2022-12-16 DIAGNOSIS — R1030 Lower abdominal pain, unspecified: Secondary | ICD-10-CM

## 2022-12-16 DIAGNOSIS — R109 Unspecified abdominal pain: Secondary | ICD-10-CM | POA: Diagnosis present

## 2022-12-16 DIAGNOSIS — I4891 Unspecified atrial fibrillation: Secondary | ICD-10-CM | POA: Diagnosis not present

## 2022-12-16 LAB — COMPREHENSIVE METABOLIC PANEL
ALT: 23 U/L (ref 0–44)
AST: 25 U/L (ref 15–41)
Albumin: 4 g/dL (ref 3.5–5.0)
Alkaline Phosphatase: 160 U/L — ABNORMAL HIGH (ref 38–126)
Anion gap: 10 (ref 5–15)
BUN: 36 mg/dL — ABNORMAL HIGH (ref 8–23)
CO2: 21 mmol/L — ABNORMAL LOW (ref 22–32)
Calcium: 8.6 mg/dL — ABNORMAL LOW (ref 8.9–10.3)
Chloride: 105 mmol/L (ref 98–111)
Creatinine, Ser: 2.08 mg/dL — ABNORMAL HIGH (ref 0.44–1.00)
GFR, Estimated: 25 mL/min — ABNORMAL LOW (ref >60)
Glucose, Bld: 95 mg/dL (ref 70–99)
Potassium: 4.4 mmol/L (ref 3.5–5.1)
Sodium: 136 mmol/L (ref 135–145)
Total Bilirubin: 0.4 mg/dL (ref 0.3–1.2)
Total Protein: 8.1 g/dL (ref 6.5–8.1)

## 2022-12-16 LAB — URINALYSIS, ROUTINE W REFLEX MICROSCOPIC
Bacteria, UA: NONE SEEN
Bilirubin Urine: NEGATIVE
Glucose, UA: >=500 mg/dL — AB
Ketones, ur: NEGATIVE mg/dL
Leukocytes,Ua: NEGATIVE
Nitrite: NEGATIVE
Protein, ur: 100 mg/dL — AB
Specific Gravity, Urine: 1.016 (ref 1.005–1.030)
pH: 5 (ref 5.0–8.0)

## 2022-12-16 LAB — CBC
HCT: 38.2 % (ref 36.0–46.0)
Hemoglobin: 11.9 g/dL — ABNORMAL LOW (ref 12.0–15.0)
MCH: 27.8 pg (ref 26.0–34.0)
MCHC: 31.2 g/dL (ref 30.0–36.0)
MCV: 89.3 fL (ref 80.0–100.0)
Platelets: 198 10*3/uL (ref 150–400)
RBC: 4.28 MIL/uL (ref 3.87–5.11)
RDW: 14.8 % (ref 11.5–15.5)
WBC: 4.9 10*3/uL (ref 4.0–10.5)
nRBC: 0 % (ref 0.0–0.2)

## 2022-12-16 LAB — LIPASE, BLOOD: Lipase: 58 U/L — ABNORMAL HIGH (ref 11–51)

## 2022-12-16 MED ORDER — POLYETHYLENE GLYCOL 3350 17 G PO PACK: 17.0000 g | PACK | Freq: Every day | ORAL | 0 refills | Status: AC

## 2022-12-16 MED ORDER — LIDOCAINE 5 % EX PTCH
1.0000 | MEDICATED_PATCH | CUTANEOUS | Status: DC
  Administered 2022-12-16: 1 via TRANSDERMAL
  Filled 2022-12-16: qty 1

## 2022-12-16 NOTE — Discharge Instructions (Signed)
Your imaging does not show any signs of stones or acute processes, but moderate stool burden associated with constipation.  I have sent a prescription of MiraLAX to your pharmacy.  Please take this daily as needed for constipation.  You can also get lidocaine patches over-the-counter for left flank pain.    Follow-up with your PCP in 5 days for reevaluation of symptoms.  Return to the ED if your symptoms worsen in the interim.

## 2022-12-16 NOTE — ED Triage Notes (Signed)
Pt states she is having lower abdominal pain/ cramping x 2 days.  Denies N/V/D

## 2022-12-16 NOTE — ED Provider Notes (Signed)
[facility] AT [facility] Provider Note   CSN: [id number] Arrival date & time: 12/16/22  4098     History  Chief Complaint  Patient presents with   Abdominal Pain    [name] is a 67 y.o. female with a history of papillary thyroid carcinoma, cognitive impairment, stage IV CKD, and atrial fibrillation presents the ED today with abdominal pain.  Patient reports lower abdominal pain with associated cramping for the past 2 days.  She has been having small and hard bowel movements but denies fever, nausea, vomiting, hematuria, dysuria, or diarrhea.  Additionally, patient reports left flank pain.  Denies injury or trauma to the back.    Home Medications Prior to Admission medications   Medication Sig Start Date End Date Taking? Authorizing Provider  polyethylene glycol (MIRALAX) 17 g packet Take 17 g by mouth daily. 12/16/22 01/15/23 Yes Maxwell Marion, PA-C  acetaminophen (TYLENOL) 500 MG tablet Take 1 tablet (500 mg total) by mouth 2 (two) times daily. Patient taking differently: Take 500 mg by mouth 2 (two) times daily as needed for mild pain or headache. 08/15/14   Kerri Perches, MD  apixaban (ELIQUIS) 5 MG TABS tablet Take 1 tablet (5 mg total) by mouth 2 (two) times daily. 11/07/22   Kerri Perches, MD  cinacalcet (SENSIPAR) 30 MG tablet Take 30 mg by mouth daily. 07/08/22   [provider]  diltiazem (CARTIA XT) 180 MG 24 hr capsule Take 1 capsule (180 mg total) by mouth daily. 11/07/22   Kerri Perches, MD  ezetimibe (ZETIA) 10 MG tablet Take 1 tablet (10 mg total) by mouth daily. 11/07/22   Kerri Perches, MD  FARXIGA 5 MG TABS tablet Take 5 mg by mouth every morning. 08/14/21   [provider]  FLUoxetine (PROZAC) 10 MG capsule TAKE 1 CAPSULE BY MOUTH ONCE A DAY. 12/11/22   Kerri Perches, MD  fluticasone (FLONASE) 50 MCG/ACT nasal spray Place 2 sprays into both nostrils daily. 11/25/22   Carmel [place] A,  PA-C  hydrALAZINE (APRESOLINE) 50 MG tablet Take 50 mg by mouth 2 (two) times daily. 04/14/22   [provider]  LAMICTAL 200 MG tablet Take 1 tablet (200 mg total) by mouth 2 (two) times daily. 11/14/22   Van Clines, MD  levETIRAcetam (KEPPRA XR) 750 MG 24 hr tablet Take 2 tablets twice a day 11/14/22   Van Clines, MD  levothyroxine (SYNTHROID) 125 MCG tablet Take 1 tablet (125 mcg total) by mouth daily. 10/17/22   Roma Kayser, MD  montelukast (SINGULAIR) 10 MG tablet TAKE (1) TABLET BY MOUTH AT BEDTIME. 11/07/22   Kerri Perches, MD  olmesartan (BENICAR) 40 MG tablet TAKE ONE TABLET BY MOUTH EVERY DAY 12/12/22   Kerri Perches, MD  omeprazole (PRILOSEC) 20 MG capsule TAKE ONE CAPSULE BY MOUTH ONCE DAILY. 12/03/22   Kerri Perches, MD  potassium chloride (KLOR-CON) 10 MEQ tablet Take 10 mEq by mouth daily. 03/18/21   [provider]  rosuvastatin (CRESTOR) 40 MG tablet Take 1 tablet (40 mg total) by mouth daily. 11/07/22   Kerri Perches, MD  UNABLE TO FIND [facility] Bath chair Bath mat 06/20/20   Kerri Perches, MD      Allergies    Simvastatin    Review of Systems   Review of Systems  Gastrointestinal:  Positive for abdominal pain.  All other systems reviewed and are negative.   Physical Exam  Updated Vital Signs BP 133/85   Pulse 75   Temp 98.8 F (37.1 C) (Oral)   Resp 16   Ht 5\' 5"  (1.651 m)   Wt 108.9 kg   SpO2 99%   BMI 39.94 kg/m  Physical Exam Vitals and nursing note reviewed.  Constitutional:      General: She is not in acute distress.    Appearance: Normal appearance.  HENT:     Head: Normocephalic and atraumatic.     Mouth/Throat:     Mouth: Mucous membranes are moist.  Eyes:     Conjunctiva/sclera: Conjunctivae normal.     Pupils: Pupils are equal, round, and reactive to light.  Cardiovascular:     Rate and Rhythm: Normal rate and regular rhythm.     Pulses: Normal pulses.     Heart sounds: Normal heart  sounds.  Pulmonary:     Effort: Pulmonary effort is normal.     Breath sounds: Normal breath sounds. No stridor.  Abdominal:     General: There is no distension.     Palpations: Abdomen is soft.     Tenderness: There is no abdominal tenderness. There is left CVA tenderness. There is no right CVA tenderness, guarding or rebound.  Skin:    General: Skin is warm and dry.     Findings: No rash.  Neurological:     General: No focal deficit present.     Mental Status: She is alert.     Sensory: No sensory deficit.     Motor: No weakness.  Psychiatric:        Mood and Affect: Mood normal.        Behavior: Behavior normal.    ED Results / Procedures / Treatments   Labs (all labs ordered are listed, but only abnormal results are displayed) Labs Reviewed  LIPASE, BLOOD - Abnormal; Notable for the following components:      Result Value   Lipase 58 (*)    All other components within normal limits  COMPREHENSIVE METABOLIC PANEL - Abnormal; Notable for the following components:   CO2 21 (*)    BUN 36 (*)    Creatinine, Ser 2.08 (*)    Calcium 8.6 (*)    Alkaline Phosphatase 160 (*)    GFR, Estimated 25 (*)    All other components within normal limits  CBC - Abnormal; Notable for the following components:   Hemoglobin 11.9 (*)    All other components within normal limits  URINALYSIS, ROUTINE W REFLEX MICROSCOPIC - Abnormal; Notable for the following components:   Glucose, UA >=500 (*)    Hgb urine dipstick SMALL (*)    Protein, ur 100 (*)    All other components within normal limits    EKG None  Radiology CT ABDOMEN PELVIS WO CONTRAST  Result Date: 12/16/2022 CLINICAL DATA:  Left lower quadrant pain for 2 days. History of renal cell carcinoma with partial right nephrectomy. EXAM: CT ABDOMEN AND PELVIS WITHOUT CONTRAST TECHNIQUE: Multidetector CT imaging of the abdomen and pelvis was performed following the standard protocol without IV contrast. RADIATION DOSE REDUCTION: This  exam was performed according to the departmental dose-optimization program which includes automated exposure control, adjustment of the mA and/or kV according to patient size and/or use of iterative reconstruction technique. COMPARISON:  06/10/2022 from [facility] FINDINGS: Lower chest: Left lower lobe calcified granuloma. Mild bibasilar scarring. Normal heart size without pericardial or pleural effusion. Hepatobiliary: Normal noncontrast appearance of the liver. Small gallstones without acute  cholecystitis or biliary duct dilatation. Pancreas: Normal, without mass or ductal dilatation. Spleen: Normal in size, without focal abnormality. Adrenals/Urinary Tract: Normal adrenal glands. Surgical sutures, likely of partial right nephrectomy. Right larger than left renal low-density lesions are likely cysts, including at up to 4.0 cm. 1.4 cm upper pole left renal lesion on 32/2 is similar to on the prior, intermediate density, incompletely characterized on this noncontrast exam. No renal calculi or hydronephrosis. No hydroureter or ureteric calculi. No bladder calculi. Stomach/Bowel: Normal stomach, without wall thickening. Colonic stool burden suggests constipation. Normal terminal ileum and appendix. Normal small bowel. Vascular/Lymphatic: Aortic atherosclerosis. No abdominopelvic adenopathy. Reproductive: Hysterectomy.  No adnexal mass. Other: No significant free fluid.  No free intraperitoneal air. Musculoskeletal: No acute osseous abnormality.  Disc bulge at L4-5. IMPRESSION: 1.  No urinary tract calculi or hydronephrosis. 2. Otherwise, low sensitivity exam secondary to stone study technique. 3.  Possible constipation. 4. 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 5. Cholelithiasis 6.  Aortic Atherosclerosis (ICD10-I70.0). Electronically Signed   By: Jeronimo Greaves M.D.   On: 12/16/2022 16:40    Procedures Procedures: not indicated.   Medications Ordered in ED Medications  lidocaine (LIDODERM)  5 % 1 patch (1 patch Transdermal Patch Applied 12/16/22 1818)    ED Course/ Medical Decision Making/ A&P                                 Medical Decision Making Amount and/or Complexity of Data Reviewed Labs: ordered. Radiology: ordered.  Risk Prescription drug management.   This patient presents to the ED for concern of abdominal pain, this involves an extensive number of treatment options, and is a complaint that carries with it a high risk of complications and morbidity.   Differential diagnosis includes: UTI, pyelonephritis, UTI nephrolithiasis, urolithiasis, appendicitis, diverticulitis, IBS, IBD, bowel obstruction, volvulus, perforation, peritonitis, contusion, muscle strain, radiculopathy, etc.   Comorbidities  See HPI above   Additional History  Additional history obtained from previous records.   Lab Tests  I ordered and personally interpreted labs.  The pertinent results include:   Creatinine elevated at 2.8, GFR of 25 - within normal limits for patient compared to previous labs. Lipase is slightly elevated at 58. CBC is within normal limits no acute infection or anemia. UA does not show any signs of infection.  Protein present, which is normal for patient.   Imaging Studies  I ordered imaging studies including CT abdomen/pelvis without contrast  I independently visualized and interpreted imaging which showed:  No urinary tract calculi or hydronephrosis Possible constipation Partial right nephrectomy without metastatic disease Cholelithiasis I agree with the radiologist interpretation   Problem List / ED Course / Critical Interventions / Medication Management  Lower abdominal pain and left flank pain x2 days I ordered medications including: Lidocaine patch for pain  Reevaluation of the patient after these medicines showed that the patient improved I have reviewed the patients home medicines and have made adjustments as needed   Social Determinants  of Health  Social connection   Test / Admission - Considered  Discussed findings with patient.  All questions were answered. She is hemodynamically stable and safe for discharge home. Miralax prescription sent to the pharmacy. Return precautions provided.       Final Clinical Impression(s) / ED Diagnoses Final diagnoses:  Lower abdominal pain  Constipation, unspecified constipation type  Left flank pain    Rx / DC Orders ED  Discharge Orders          Ordered    polyethylene glycol (MIRALAX) 17 g packet  Daily        12/16/22 1823              Maxwell Marion, PA-C 12/16/22 1828    Glendora Score, MD 12/17/22 1332

## 2022-12-17 ENCOUNTER — Telehealth: Payer: Self-pay | Admitting: Family Medicine

## 2022-12-17 NOTE — Telephone Encounter (Signed)
error 

## 2023-01-12 ENCOUNTER — Other Ambulatory Visit: Payer: Self-pay | Admitting: Family Medicine

## 2023-01-16 ENCOUNTER — Ambulatory Visit (HOSPITAL_COMMUNITY)
Admission: RE | Admit: 2023-01-16 | Discharge: 2023-01-16 | Disposition: A | Discharge status: Home / Self Care | Payer: Medicare Other | Source: Ambulatory Visit | Attending: Family Medicine | Admitting: Family Medicine

## 2023-01-16 ENCOUNTER — Encounter (HOSPITAL_COMMUNITY): Payer: Self-pay

## 2023-01-16 DIAGNOSIS — Z1231 Encounter for screening mammogram for malignant neoplasm of breast: Secondary | ICD-10-CM | POA: Diagnosis present

## 2023-02-11 ENCOUNTER — Other Ambulatory Visit: Payer: Self-pay | Admitting: Family Medicine

## 2023-03-11 ENCOUNTER — Other Ambulatory Visit: Payer: Self-pay | Admitting: Family Medicine

## 2023-03-12 ENCOUNTER — Other Ambulatory Visit: Payer: Self-pay | Admitting: Family Medicine

## 2023-03-16 ENCOUNTER — Encounter: Payer: Self-pay | Admitting: Neurology

## 2023-03-16 ENCOUNTER — Ambulatory Visit (INDEPENDENT_AMBULATORY_CARE_PROVIDER_SITE_OTHER): Payer: Medicare Other | Admitting: Neurology

## 2023-03-16 VITALS — BP 109/63 | HR 85 | Ht 65.0 in | Wt 244.8 lb

## 2023-03-16 DIAGNOSIS — G40009 Localization-related (focal) (partial) idiopathic epilepsy and epileptic syndromes with seizures of localized onset, not intractable, without status epilepticus: Secondary | ICD-10-CM | POA: Diagnosis not present

## 2023-03-16 MED ORDER — LAMICTAL 200 MG PO TABS: 200.0000 mg | ORAL_TABLET | Freq: Two times a day (BID) | ORAL | 3 refills | Status: DC

## 2023-03-16 MED ORDER — LEVETIRACETAM ER 750 MG PO TB24: ORAL_TABLET | ORAL | 3 refills | Status: DC

## 2023-03-16 NOTE — Patient Instructions (Signed)
Good to see you! Continue all your medications. Follow-up in 4 months, call for any changes.    Seizure Precautions: 1. If medication has been prescribed for you to prevent seizures, take it exactly as directed.  Do not stop taking the medicine without talking to your doctor first, even if you have not had a seizure in a long time.   2. Avoid activities in which a seizure would cause danger to yourself or to others.  Don't operate dangerous machinery, swim alone, or climb in high or dangerous places, such as on ladders, roofs, or girders.  Do not drive unless your doctor says you may.  3. If you have any warning that you may have a seizure, lay down in a safe place where you can't hurt yourself.    4.  No driving for 6 months from last seizure, as per [facility].   Please refer to the following link on the Epile[employer] of [employer]'s website for more information: http://www.epilepsyfoundation.org/answerplace/Social/driving/drivingu.cfm   5.  Maintain good sleep hygiene.   6.  Contact your doctor if you have any problems that may be related to the medicine you are taking.  7.  Call 911 and bring the patient back to the ED if:        A.  The seizure lasts longer than 5 minutes.       B.  The patient doesn't awaken shortly after the seizure  C.  The patient has new problems such as difficulty seeing, speaking or moving  D.  The patient was injured during the seizure  E.  The patient has a temperature over 102 F (39C)  F.  The patient vomited and now is having trouble breathing

## 2023-03-16 NOTE — Progress Notes (Signed)
NEUROLOGY FOLLOW UP OFFICE NOTE  [name] [id number] 07-26-1955  HISTORY OF PRESENT ILLNESS: I had the pleasure of seeing [name] in follow-up in the neurology clinic on 03/16/2023.  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 She is again alone in the office today. Records and images were personally reviewed where available.  Since her last visit, she is happy to report that she has not had any seizures since 08/03/22. She lives alone but has an aide coming 5 days a week who has not mentioned any staring/unresponsive episodes. She denies any olfactory/gustatory hallucinations, focal numbness/tingling/weakness, myoclonic jerks. No headaches, dizziness, vision changes, no falls. She has some right leg pain. Sleep is okay. Mood is good. No side effects on brand Lamictal 200mg  BID and Levetiracetam ER 750mg  2 tabs BID (1500mg  BID), her aide helps with medications that come in a pillpack.    History on Initial Assessment 01/22/2022: This is a 68 year old right-handed woman with a history of hypertension, hyperlipidemia, hypothyroidism, atrial fibrillation on Eliquis, partial nephrectomy, mild intellectual disability, presenting to establish care for seizures as Dr. Gerilyn Pilgrim is closing his office. She is a poor historian but able to answer questions adequately. She states seizures started as a baby. She does not recall prior medications she had taken. She reports that she has been having "little ones" that want to come, she feels like she is dizzy and "going out of my head." She felt this way this morning. She feels this just started when "they changed my medications." Last note from Dr. Ronal Fear office from June 2023 indicate that her Levetiracetam was switched to extended-release. She was in the ER the day prior on 08/12/21 after having 2 seizures at home. Her daughter had previously described seizures as spacing out then collapsing and shaking with tongue  bite, sleepiness and disorientation after. She states this was the last time she had a GTC. She has been told she would stare off, the last time she was told was some time this month. Sometimes she notices a metallic taste/dry mouth. She denies any focal numbness/tingling/weakness or myoclonic jerks. She has pain on her right knee from arthritis. She denies any headaches, diplopia, dysarthria/dysphagia, neck/back pain, bowel/bladder dysfunction. She reports sleeping well, then stated she wakes up several times at night, no daytime drowsiness. She lives alone, her daughter lives next door. She has an aide coming daily to helps with meals. Her daughter manages finances. She manages her own medications with a pillpack and denies missing any doses. On her last visit with Dr. Gerilyn Pilgrim, daughter was concerned she was not taking medications properly despite being prepackaged. She is on Levetiracetam ER 750mg  2 tabs BID (1500mg  BID) and Lamotrigine 150mg  1/2 tablet BID (75mg  BID). Per notes, she did not tolerate higher dose of Lamotrigine and was sleepy on immediate-release Levetiracetam.   Epilepsy Risk Factors:  Mild intellectual disability. There is no history of febrile convulsions, CNS infections such as meningitis/encephalitis, significant traumatic brain injury, neurosurgical procedures, or family history of seizures.  Diagnostic Data: I personally reviewed MRI brain without contrast done 06/2013 with asymmetric atrophy and mildly increased T2 signal in the left hippocampus. Mild chronic microvascular disease. There was marked, disproportionate cerebellar atrophy.  brain MRI with and without contrast done 01/2022 did not show any acute changes. There was again note of mild asymmetric atrophy and increased T2 signal in the left hippocampus, cerebellar atrophy, and mild chronic microvascular disease.   EEG  from 2020 was normal.  EEG in 01/2022 was within normal limits.   Prior ASMs: she did not tolerate  higher dose of Lamotrigine, more seizures on generic Lamotrigine, and was sleepy on immediate-release Levetiracetam.   PAST MEDICAL HISTORY: Past Medical History:  Diagnosis Date   A-fib (HCC)    Anemia    Depression    Dyspnea    Fractures    Hyperlipidemia    Hypertension    Mental retardation, mild (I.Q. 50-70)    lives alone and cares for self has a Engineer, civil (consulting) aide comes once a day    Obesity    Pneumonia    PONV (postoperative nausea and vomiting)    Seizure (HCC)    11/17/2012 "still having them; "I believe their from a tumor in my head" ((11/17/2012   Seizure disorder (HCC)    Thyroid cancer (HCC)     MEDICATIONS: Current Outpatient Medications on File Prior to Visit  Medication Sig Dispense Refill   acetaminophen (TYLENOL) 500 MG tablet Take 1 tablet (500 mg total) by mouth 2 (two) times daily. (Patient taking differently: Take 500 mg by mouth 2 (two) times daily as needed for mild pain (pain score 1-3) or headache.) 60 tablet 5   cinacalcet (SENSIPAR) 30 MG tablet Take 30 mg by mouth daily.     diltiazem (CARDIZEM CD) 180 MG 24 hr capsule TAKE ONE CAPSULE BY MOUTH EVERY DAY 30 capsule 0   ELIQUIS 5 MG TABS tablet TAKE ONE TABLET BY MOUTH TWICE DAILY 60 tablet 0   ezetimibe (ZETIA) 10 MG tablet TAKE ONE TABLET BY MOUTH EVERY DAY 30 tablet 0   FARXIGA 5 MG TABS tablet Take 5 mg by mouth every morning.     FLUoxetine (PROZAC) 10 MG capsule TAKE 1 CAPSULE BY MOUTH ONCE A DAY. 30 capsule 0   fluticasone (FLONASE) 50 MCG/ACT nasal spray Place 2 sprays into both nostrils daily. 11.1 mL 0   hydrALAZINE (APRESOLINE) 50 MG tablet Take 50 mg by mouth 2 (two) times daily.     LAMICTAL 200 MG tablet Take 1 tablet (200 mg total) by mouth 2 (two) times daily. 180 tablet 3   levETIRAcetam (KEPPRA XR) 750 MG 24 hr tablet Take 2 tablets twice a day 360 tablet 3   levothyroxine (SYNTHROID) 125 MCG tablet Take 1 tablet (125 mcg total) by mouth daily. 90 tablet 3   montelukast (SINGULAIR) 10 MG  tablet TAKE ONE TABLET BY MOUTH AT BEDTIME 30 tablet 0   olmesartan (BENICAR) 40 MG tablet TAKE ONE TABLET BY MOUTH EVERY DAY 30 tablet 0   omeprazole (PRILOSEC) 20 MG capsule TAKE ONE CAPSULE BY MOUTH ONCE DAILY. 30 capsule 0   potassium chloride (KLOR-CON) 10 MEQ tablet Take 10 mEq by mouth daily.     rosuvastatin (CRESTOR) 40 MG tablet TAKE ONE TABLET BY MOUTH EVERY DAY 30 tablet 0   UNABLE TO FIND [facility] Bath chair Bath mat 1 each 0   No current facility-administered medications on file prior to visit.    ALLERGIES: Allergies  Allergen Reactions   Simvastatin Other (See Comments)    Muscle aches    FAMILY HISTORY: Family History  Problem Relation Age of Onset   Diabetes Mother    Colon cancer Neg Hx     SOCIAL HISTORY: Social History   Socioeconomic History   Marital status: Single    Spouse name: Not on file   Number of children: 2   Years of education: Not on file  Highest education level: Not on file  Occupational History   Occupation: disabled   Tobacco Use   Smoking status: Never   Smokeless tobacco: Never  Vaping Use   Vaping status: Never Used  Substance and Sexual Activity   Alcohol use: No   Drug use: No   Sexual activity: Never  Other Topics Concern   Not on file  Social History Narrative   Right handed   Caffeine intake yes   Retired   Lives alone   Social Drivers of Health   Financial Resource Strain: Low Risk  (06/13/2020)   Overall Financial Resource Strain (CARDIA)    Difficulty of Paying Living Expenses: Not hard at all  Food Insecurity: No Food Insecurity (06/13/2020)   Hunger Vital Sign    Worried About Running Out of Food in the Last Year: Never true    Ran Out of Food in the Last Year: Never true  Transportation Needs: No Transportation Needs (06/13/2020)   PRAPARE - Administrator, Civil Service (Medical): No    Lack of Transportation (Non-Medical): No  Physical Activity: Sufficiently Active (06/13/2020)   Exercise  Vital Sign    Days of Exercise per Week: 7 days    Minutes of Exercise per Session: 60 min  Stress: No Stress Concern Present (06/13/2020)   [employer] of Occupational Health - Occupational Stress Questionnaire    Feeling of Stress : Not at all  Social Connections: Socially Isolated (06/13/2020)   Social Connection and Isolation Panel [NHANES]    Frequency of Communication with Friends and Family: More than three times a week    Frequency of Social Gatherings with Friends and Family: Twice a week    Attends Religious Services: Never    Database administrator or Organizations: No    Attends Banker Meetings: Never    Marital Status: Never married  Intimate Partner Violence: Not At Risk (06/13/2020)   Humiliation, Afraid, Rape, and Kick questionnaire    Fear of Current or Ex-Partner: No    Emotionally Abused: No    Physically Abused: No    Sexually Abused: No     PHYSICAL EXAM: Vitals:   03/16/23 1120  BP: 109/63  Pulse: 85  SpO2: 97%   General: No acute distress Head:  Normocephalic/atraumatic Skin/Extremities: No rash, no edema Neurological Exam: alert and awake. No aphasia or dysarthria. Fund of knowledge is appropriate.  Attention and concentration are normal.   Cranial nerves: Pupils equal, round. Extraocular movements intact with no nystagmus. Visual fields full.  No facial asymmetry.  Motor: Bulk and tone normal, muscle strength 5/5 throughout with no pronator drift.   Finger to nose testing intact.  Gait slow and cautious reporting right leg pain, no ataxia. No tremors.    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 MRI brain showed asymmetric atrophy and increased signal in the left hippocampus, EEG normal. She denies any seizures since June 2024, continue brand Lamictal 200mg   BID and Levetiracetam ER 1500mg  BID. She does not drive. Follow-up in 4 months, call for any changes.   Thank you for allowing me to participate in her care.  Please do not hesitate to call for any questions or concerns.    Patrcia Dolly, M.D.   CC: Dr. Lodema Hong

## 2023-03-31 ENCOUNTER — Encounter: Payer: Self-pay | Admitting: Neurology

## 2023-04-08 ENCOUNTER — Other Ambulatory Visit: Payer: Self-pay | Admitting: Family Medicine

## 2023-04-14 ENCOUNTER — Ambulatory Visit (INDEPENDENT_AMBULATORY_CARE_PROVIDER_SITE_OTHER): Payer: Medicare Other

## 2023-04-14 VITALS — Ht 65.0 in | Wt 244.0 lb

## 2023-04-14 DIAGNOSIS — Z0000 Encounter for general adult medical examination without abnormal findings: Secondary | ICD-10-CM

## 2023-04-14 DIAGNOSIS — Z780 Asymptomatic menopausal state: Secondary | ICD-10-CM | POA: Diagnosis not present

## 2023-04-14 DIAGNOSIS — Z0100 Encounter for examination of eyes and vision without abnormal findings: Secondary | ICD-10-CM

## 2023-04-14 NOTE — Progress Notes (Signed)
 Because this visit was a virtual/telehealth visit,  certain criteria was not obtained, such a blood pressure, CBG if applicable, and timed get up and go. Any medications not marked as "taking" were not mentioned during the medication reconciliation part of the visit. Any vitals not documented were not able to be obtained due to this being a telehealth visit or patient was unable to self-report a recent blood pressure reading due to a lack of equipment at home via telehealth. Vitals that have been documented are verbally provided by the patient.  Interactive audio and video telecommunications were attempted between this provider and patient, however failed, due to patient having technical difficulties OR patient did not have access to video capability.  We continued and completed visit with audio only.  Subjective:   [name] is a 68 y.o. female who presents for Medicare Annual (Subsequent) preventive examination.  Visit Complete: Virtual I connected with  [name] on 04/14/23 by a audio enabled telemedicine application and verified that I am speaking with the correct person using two identifiers.  Patient Location: Home  Provider Location: Home Office  I discussed the limitations of evaluation and management by telemedicine. The patient expressed understanding and agreed to proceed.  Vital Signs: Because this visit was a virtual/telehealth visit, some criteria may be missing or patient reported. Any vitals not documented were not able to be obtained and vitals that have been documented are patient reported.  Cardiac Risk Factors include: advanced age (>8men, >33 women);dyslipidemia;hypertension;sedentary lifestyle;obesity (BMI >30kg/m2);Other (see comment), Risk factor comments: Afib, seizure disorder     Objective:    Today's Vitals   04/14/23 1037  Weight: 244 lb (110.7 kg)  Height: 5\' 5"  (1.651 m)   Body mass index is 40.6 kg/m.     04/14/2023   10:36 AM 03/16/2023    11:26 AM 12/16/2022   11:21 AM 11/25/2022   12:03 PM 11/14/2022   10:01 AM 08/15/2022   12:50 PM 08/03/2022    5:09 AM  Advanced Directives  Does Patient Have a Medical Advance Directive? No No No No No No No  Would patient like information on creating a medical advance directive? No - Patient declined      No - Patient declined    Current Medications (verified) Outpatient Encounter Medications as of 04/14/2023  Medication Sig   acetaminophen (TYLENOL) 500 MG tablet Take 1 tablet (500 mg total) by mouth 2 (two) times daily. (Patient taking differently: Take 500 mg by mouth 2 (two) times daily as needed for mild pain (pain score 1-3) or headache.)   cinacalcet (SENSIPAR) 30 MG tablet Take 30 mg by mouth daily.   diltiazem (CARDIZEM CD) 180 MG 24 hr capsule TAKE ONE CAPSULE BY MOUTH EVERY DAY   ELIQUIS 5 MG TABS tablet TAKE ONE TABLET BY MOUTH TWICE DAILY   ezetimibe (ZETIA) 10 MG tablet TAKE ONE TABLET BY MOUTH EVERY DAY   FARXIGA 5 MG TABS tablet Take 5 mg by mouth every morning.   FLUoxetine (PROZAC) 10 MG capsule TAKE 1 CAPSULE BY MOUTH ONCE A DAY.   fluticasone (FLONASE) 50 MCG/ACT nasal spray Place 2 sprays into both nostrils daily.   hydrALAZINE (APRESOLINE) 50 MG tablet Take 50 mg by mouth 2 (two) times daily.   LAMICTAL 200 MG tablet Take 1 tablet (200 mg total) by mouth 2 (two) times daily.   levETIRAcetam (KEPPRA XR) 750 MG 24 hr tablet Take 2 tablets twice a day   levothyroxine (SYNTHROID) 125 MCG  tablet Take 1 tablet (125 mcg total) by mouth daily.   montelukast (SINGULAIR) 10 MG tablet TAKE ONE TABLET BY MOUTH AT BEDTIME   olmesartan (BENICAR) 40 MG tablet TAKE ONE TABLET BY MOUTH EVERY DAY   omeprazole (PRILOSEC) 20 MG capsule TAKE ONE CAPSULE BY MOUTH ONCE DAILY.   potassium chloride (KLOR-CON) 10 MEQ tablet Take 10 mEq by mouth daily.   rosuvastatin (CRESTOR) 40 MG tablet TAKE ONE TABLET BY MOUTH EVERY DAY   UNABLE TO FIND HHS Bath chair Bath mat   No facility-administered  encounter medications on file as of 04/14/2023.    Allergies (verified) Simvastatin   History: Past Medical History:  Diagnosis Date   A-fib (HCC)    Anemia    Depression    Dyspnea    Fractures    Hyperlipidemia    Hypertension    Mental retardation, mild (I.Q. 50-70)    lives alone and cares for self has a nurse aide comes once a day    Obesity    Pneumonia    PONV (postoperative nausea and vomiting)    Seizure (HCC)    11/17/2012 "still having them; "I believe their from a tumor in my head" ((11/17/2012   Seizure disorder (HCC)    Thyroid cancer [facility])    Past Surgical History:  Procedure Laterality Date   ABDOMINAL HYSTERECTOMY  2001   BREAST BIOPSY Right 04/16/2020   ADENOSIS   BREAST LUMPECTOMY  1996   right    COLONOSCOPY N/A 05/16/2016   Dr. Darrick Penna: Redundant colon, hemorrhoids, next colonoscopy 10 years   INGUINAL HERNIA REPAIR N/A 08/12/2019   Procedure: laparoscopic incisional hernia repair;  Surgeon: Crista Elliot, MD;  Location: [facility] OR[facility];  Service: Urology;  Laterality: N/A;   ROBOTIC ASSITED PARTIAL NEPHRECTOMY Right 08/05/2019   Procedure: XI ROBOTIC ASSITED PARTIAL NEPHRECTOMY WITH INTRAOPERATIVE ULTRASOUND;  Surgeon: Sebastian Ache, MD;  Location: [facility];  Service: Urology;  Laterality: Right;  3 HRS   THYROIDECTOMY Bilateral 11/17/2012   Procedure: TOTAL THYROIDECTOMY;  Surgeon: Darletta Moll, MD;  Location: [facility] OR;  Service: ENT;  Laterality: Bilateral;   TOTAL THYROIDECTOMY Bilateral 11/17/2012   TUBAL LIGATION     Family History  Problem Relation Age of Onset   Diabetes Mother    Colon cancer Neg Hx    Social History   Socioeconomic History   Marital status: Single    Spouse name: Not on file   Number of children: 2   Years of education: Not on file   Highest education level: Not on file  Occupational History   Occupation: disabled   Tobacco Use   Smoking status: Never   Smokeless tobacco: Never  Vaping Use   Vaping status: Never Used   Substance and Sexual Activity   Alcohol use: No   Drug use: No   Sexual activity: Never  Other Topics Concern   Not on file  Social History Narrative   Right handed   Caffeine intake yes   Retired   Lives alone   Social Drivers of Health   Financial Resource Strain: Low Risk  (04/14/2023)   Overall Financial Resource Strain (CARDIA)    Difficulty of Paying Living Expenses: Not hard at all  Food Insecurity: No Food Insecurity (04/14/2023)   Hunger Vital Sign    Worried About Running Out of Food in the Last Year: Never true    Ran Out of Food in the Last Year: Never true  Transportation Needs: No Transportation  Needs (04/14/2023)   PRAPARE - Administrator, Civil Service (Medical): No    Lack of Transportation (Non-Medical): No  Physical Activity: Insufficiently Active (04/14/2023)   Exercise Vital Sign    Days of Exercise per Week: 3 days    Minutes of Exercise per Session: 20 min  Stress: No Stress Concern Present (04/14/2023)   [employer] of Occupational Health - Occupational Stress Questionnaire    Feeling of Stress : Not at all  Social Connections: Socially Isolated (04/14/2023)   Social Connection and Isolation Panel [NHANES]    Frequency of Communication with Friends and Family: Once a week    Frequency of Social Gatherings with Friends and Family: Once a week    Attends Religious Services: More than 4 times per year    Active Member of [employer] or Organizations: No    Attends Engineer, structural: Never    Marital Status: Never married    Tobacco Counseling Counseling given: Yes   Clinical Intake:  Pre-visit preparation completed: Yes  Pain : No/denies pain     BMI - recorded: 40.6 Nutritional Status: BMI > 30  Obese Nutritional Risks: None Diabetes: No  How often do you need to have someone help you when you read instructions, pamphlets, or other written materials from your doctor or pharmacy?: 2 - Rarely  Interpreter Needed?:  No  Information entered by :: Maryjean Ka CMA   Activities of Daily Living    04/14/2023   10:46 AM  In your present state of health, do you have any difficulty performing the following activities:  Hearing? 0  Vision? 0  Difficulty concentrating or making decisions? 0  Walking or climbing stairs? 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 0  Doing errands, shopping? 1  Comment she can go alone, but she no longer drives. Uses the [facility] to get to appts.  Preparing Food and eating ? N  Using the Toilet? N  In the past six months, have you accidently leaked urine? N  Do you have problems with loss of bowel control? N  Managing your Medications? Y  Managing your Finances? Y  Housekeeping or managing your Housekeeping? Y    Patient Care Team: Kerri Perches, MD as PCP - General Branch, Dorothe Pea, MD as PCP - Cardiology (Cardiology) Lanelle Bal, DO as Consulting Physician (Internal Medicine) Van Clines, MD as Consulting Physician (Neurology) Dani Gobble, NP as Nurse Practitioner (Gastroenterology) Sharlene Dory, NP as Nurse Practitioner (Cardiology) Randa Lynn, MD as Referring Physician (Nephrology)  Indicate any recent Medical Services you may have received from other than [facility] providers in the past year (date may be approximate).     Assessment:   This is a routine wellness examination for [name].  Hearing/Vision screen Hearing Screening - Comments:: Patient denies any hearing difficulties.   Vision Screening - Comments:: No eye doctor. Referral placed today for patient.    Goals Addressed             This Visit's Progress    Patient Stated       Patient refused to set a goal       Depression Screen    04/14/2023   10:48 AM 11/05/2022   11:00 AM 10/09/2022    1:49 PM 09/02/2022   10:06 AM 07/24/2022    9:16 AM 04/02/2022   10:02 AM 10/31/2021   10:55 AM  PHQ 2/9 Scores  PHQ -  2 Score 0 0 0 0 0 0 0  PHQ- 9 Score  0   0 0      Fall Risk    04/14/2023   10:46 AM 03/16/2023   11:26 AM 11/14/2022   10:01 AM 11/05/2022   10:59 AM 10/09/2022    1:48 PM  Fall Risk   Falls in the past year? 0 0 1 1 1   Number falls in past yr: 0 0 1 0 0  Comment    x 3 weeks on :Left side   Injury with Fall? 0 0 0 1 1  Risk for fall due to : No Fall Risks      Follow up Falls prevention discussed;Falls evaluation completed Falls evaluation completed Falls evaluation completed      MEDICARE RISK AT HOME: Medicare Risk at Home Any stairs in or around the home?: No If so, are there any without handrails?: No Home free of loose throw rugs in walkways, pet beds, electrical cords, etc?: Yes Adequate lighting in your home to reduce risk of falls?: Yes Life alert?: No Use of a cane, walker or w/c?: No Grab bars in the bathroom?: No Shower chair or bench in shower?: Yes Elevated toilet seat or a handicapped toilet?: No  TIMED UP AND GO:  Was the test performed?  No    Cognitive Function:        04/14/2023   10:41 AM 06/17/2021   11:09 AM  6CIT Screen  What Year? 0 points 0 points  What month? 0 points 0 points  What time? 0 points 0 points  Count back from 20 0 points 0 points  Months in reverse 0 points 2 points  Repeat phrase 10 points 4 points  Total Score 10 points 6 points    Immunizations Immunization History  Administered Date(s) Administered   Fluad Quad(high Dose 65+) 11/29/2019, 11/29/2020, 10/31/2021   Fluad Trivalent(High Dose 65+) 11/05/2022   H1N1 12/16/2007   Influenza Split 11/24/2011   Influenza Whole 11/30/2006, 12/07/2008, 12/26/2009   Influenza,inj,Quad PF,6+ Mos 11/18/2012, 12/06/2013, 05/23/2015, 01/29/2016, 12/01/2016, 01/18/2018, 12/01/2018   Pneumococcal Conjugate-13 04/03/2014   Pneumococcal Polysaccharide-23 05/10/2019   Td 08/22/2003   Tdap 04/02/2016   Unspecified SARS-COV-2 Vaccination 05/30/2019, 04/20/2020   Zoster Recombinant(Shingrix) 12/05/2020, 05/01/2021    TDAP  status: Up to date  Flu Vaccine status: Up to date  Pneumococcal vaccine status: Up to date  Covid-19 vaccine status: Information provided on how to obtain vaccines.   Qualifies for Shingles Vaccine? No   Zostavax completed No   Shingrix Completed?: Yes  Screening Tests Health Maintenance  Topic Date Due   COVID-19 Vaccine (3 - Mixed Product risk series) 05/18/2020   DEXA SCAN  11/06/2021   Medicare Annual Wellness (AWV)  06/18/2022   MAMMOGRAM  01/16/2024   DTaP/Tdap/Td (3 - Td or Tdap) 04/02/2026   Colonoscopy  05/17/2026   Pneumonia Vaccine 103+ Years old  Completed   INFLUENZA VACCINE  Completed   Hepatitis C Screening  Completed   Zoster Vaccines- Shingrix  Completed   HPV VACCINES  Aged Out    Health Maintenance  Health Maintenance Due  Topic Date Due   COVID-19 Vaccine (3 - Mixed Product risk series) 05/18/2020   DEXA SCAN  11/06/2021   Medicare Annual Wellness (AWV)  06/18/2022    Colorectal cancer screening: Type of screening: Colonoscopy. Completed 05/16/2016. Repeat every 10 years  Mammogram status: Completed 01/16/2023. Repeat every year  Bone Density status: Ordered 04/14/2023. Pt provided  with contact info and advised to call to schedule appt.  Lung Cancer Screening: (Low Dose CT Chest recommended if Age 70-80 years, 20 pack-year currently smoking OR have quit w/in 15years.) does not qualify.   Lung Cancer Screening Referral: na  Additional Screening:  Hepatitis C Screening: does not qualify; Completed   Vision Screening: Recommended annual ophthalmology exams for early detection of glaucoma and other disorders of the eye. Is the patient up to date with their annual eye exam?  No  Who is the provider or what is the name of the office in which the patient attends annual eye exams? N/a If pt is not established with a provider, would they like to be referred to a provider to establish care? Yes .   Dental Screening: Recommended annual dental exams for  proper oral hygiene  Diabetic Foot Exam: na  Community Resource Referral / Chronic Care Management: CRR required this visit?  No   CCM required this visit?  No     Plan:     I have personally reviewed and noted the following in the patient's chart:   Medical and social history Use of alcohol, tobacco or illicit drugs  Current medications and supplements including opioid prescriptions. Patient is not currently taking opioid prescriptions. Functional ability and status Nutritional status Physical activity Advanced directives List of other physicians Hospitalizations, surgeries, and ER visits in previous 12 months Vitals Screenings to include cognitive, depression, and falls Referrals and appointments  In addition, I have reviewed and discussed with patient certain preventive protocols, quality metrics, and best practice recommendations. A written personalized care plan for preventive services as well as general preventive health recommendations were provided to patient.     Jordan Hawks Nikitha Mode, CMA   04/14/2023   After Visit Summary: (Mail) Due to this being a telephonic visit, the after visit summary with patients personalized plan was offered to patient via mail   Nurse Notes: see routing comment

## 2023-04-14 NOTE — Patient Instructions (Addendum)
 [name] , Thank you for taking time to come for your Medicare Wellness Visit. I appreciate your ongoing commitment to your health goals. Please review the following plan we discussed and let me know if I can assist you in the future.   Referrals/Orders/Follow-Ups/Clinician Recommendations:  Next Medicare Annual Wellness Visit:   April 18, 2024 at 10:40 am telephone visit.   You have an order for:  Bone Density   Please call for appointment:  [facility] [address] [place], [place] [address] [phone] Make sure to wear two-piece clothing.  No lotions powders or deodorants the day of the appointment Make sure to bring picture ID and insurance card.  Bring list of medications you are currently taking including any supplements.   You've been referred to see Dr. Sinda Du for an eye exam. If you haven't heard from his office in about a week, please call to schedule your appointment.   Sinda Du, MD [address] [place] [address]  Phone: [phone]   If lab work has been ordered for you today, you may have these drawn at the same lab you have your routine lab work drawn for your primary care provider.  Labs Ordered: na  This is a list of the screening recommended for you and due dates:  Health Maintenance  Topic Date Due   COVID-19 Vaccine (3 - Mixed Product risk series) 05/18/2020   DEXA scan (bone density measurement)  11/06/2021   Mammogram  01/16/2024   Medicare Annual Wellness Visit  04/13/2024   DTaP/Tdap/Td vaccine (3 - Td or Tdap) 04/02/2026   Colon Cancer Screening  05/17/2026   Pneumonia Vaccine  Completed   Flu Shot  Completed   Hepatitis C Screening  Completed   Zoster (Shingles) Vaccine  Completed   HPV Vaccine  Aged Out   Advanced directives: (Declined) Advance directive discussed with you today. Even though you declined this today, please call our office should you change your mind, and we can give  you the proper paperwork for you to fill out.  Next Medicare Annual Wellness Visit scheduled for next year: yes Understanding Your Risk for Falls Millions of people have serious injuries from falls each year. It is important to understand your risk of falling. Talk with your health care provider about your risk and what you can do to lower it. If you do have a serious fall, make sure to tell your provider. Falling once raises your risk of falling again. How can falls affect me? Serious injuries from falls are common. These include: Broken bones, such as hip fractures. Head injuries, such as traumatic brain injuries (TBI) or concussions. A fear of falling can cause you to avoid activities and stay at home. This can make your muscles weaker and raise your risk for a fall. What can increase my risk? There are a number of risk factors that increase your risk for falling. The more risk factors you have, the higher your risk of falling. Serious injuries from a fall happen most often to people who are older than 69 years old. Teenagers and young adults ages 26-29 are also at higher risk. Common risk factors include: Weakness in the lower body. Being generally weak or confused due to long-term (chronic) illness. Dizziness or balance problems. Poor vision. Medicines that cause dizziness or drowsiness. These may include: Medicines for your blood pressure, heart, anxiety, insomnia, or swelling (edema). Pain medicines. Muscle relaxants. Other risk factors  include: Drinking alcohol. Having had a fall in the past. Having foot pain or wearing improper footwear. Working at a dangerous job. Having any of the following in your home: Tripping hazards, such as floor clutter or loose rugs. Poor lighting. Pets. Having dementia or memory loss. What actions can I take to lower my risk of falling?     Physical activity Stay physically fit. Do strength and balance exercises. Consider taking a regular  class to build strength and balance. Yoga and tai chi are good options. Vision Have your eyes checked every year and your prescription for glasses or contacts updated as needed. Shoes and walking aids Wear non-skid shoes. Wear shoes that have rubber soles and low heels. Do not wear high heels. Do not walk around the house in socks or slippers. Use a cane or walker as told by your provider. Home safety Attach secure railings on both sides of your stairs. Install grab bars for your bathtub, shower, and toilet. Use a non-skid mat in your bathtub or shower. Attach bath mats securely with double-sided, non-slip rug tape. Use good lighting in all rooms. Keep a flashlight near your bed. Make sure there is a clear path from your bed to the bathroom. Use night-lights. Do not use throw rugs. Make sure all carpeting is taped or tacked down securely. Remove all clutter from walkways and stairways, including extension cords. Repair uneven or broken steps and floors. Avoid walking on icy or slippery surfaces. Walk on the grass instead of on icy or slick sidewalks. Use ice melter to get rid of ice on walkways in the winter. Use a cordless phone. Questions to ask your health care provider Can you help me check my risk for a fall? Do any of my medicines make me more likely to fall? Should I take a vitamin D supplement? What exercises can I do to improve my strength and balance? Should I make an appointment to have my vision checked? Do I need a bone density test to check for weak bones (osteoporosis)? Would it help to use a cane or a walker? Where to find more information Centers for Disease Control and Prevention, STEAD[employer]: TonerPromos.no Community-Based Fall Prevention Programs: TonerPromos.no [employer] on Aging: BaseRingTones.pl Contact a health care provider if: You fall at home. You are afraid of falling at home. You feel weak, drowsy, or dizzy. This information is not intended to replace advice given to  you by your health care provider. Make sure you discuss any questions you have with your health care provider. Document Revised: 10/14/2021 Document Reviewed: 10/14/2021 Elsevier Patient Education  2024 [employer].

## 2023-04-22 ENCOUNTER — Ambulatory Visit: Payer: Self-pay | Admitting: Family Medicine

## 2023-04-30 ENCOUNTER — Encounter: Payer: Self-pay | Admitting: Family Medicine

## 2023-05-11 ENCOUNTER — Other Ambulatory Visit: Payer: Self-pay | Admitting: Family Medicine

## 2023-05-26 ENCOUNTER — Inpatient Hospital Stay (HOSPITAL_COMMUNITY): Admission: RE | Admit: 2023-05-26 | Source: Ambulatory Visit

## 2023-05-31 ENCOUNTER — Other Ambulatory Visit: Payer: Self-pay | Admitting: Family Medicine

## 2023-06-10 ENCOUNTER — Other Ambulatory Visit: Payer: Self-pay | Admitting: Family Medicine

## 2023-06-11 ENCOUNTER — Other Ambulatory Visit: Payer: Self-pay | Admitting: Family Medicine

## 2023-06-12 ENCOUNTER — Ambulatory Visit: Payer: Medicare Other | Admitting: Cardiology

## 2023-07-10 ENCOUNTER — Other Ambulatory Visit: Payer: Self-pay | Admitting: Family Medicine

## 2023-07-22 ENCOUNTER — Ambulatory Visit: Payer: Medicare Other | Admitting: Neurology

## 2023-08-29 ENCOUNTER — Other Ambulatory Visit: Payer: Self-pay | Admitting: Family Medicine

## 2023-09-01 ENCOUNTER — Ambulatory Visit: Admitting: Cardiology

## 2023-09-01 NOTE — Progress Notes (Deleted)
 Clinical Summary Ms. Mitchner is a 68 y.o.female  seen today as a new consult, referred by Dr Antonetta for the following medical problems.    1. PAF - appears to be new diagnosis 07/2019 during admission with [facility] - she is on diltiazem , eliquis    - infrequent palpitations.  - compliant with meds, no bleeding eliquis    2. HTN - she is compliant with meds -    3. Hyperlipidemia - Jan 2022 TC 238 TG 84 HDL 51 LDL 172 - muscle aches on simvastatin  04/2021 TC 139 TG 77 HDL 50 LDL 74   4. CKD - followed by Dr Rachele Past Medical History:  Diagnosis Date   A-fib (HCC)    Anemia    Depression    Dyspnea    Fractures    Hyperlipidemia    Hypertension    Mental retardation, mild (I.Q. 50-70)    lives alone and cares for self has a nurse aide comes once a day    Obesity    Pneumonia    PONV (postoperative nausea and vomiting)    Seizure (HCC)    11/17/2012 still having them; I believe their from a tumor in my head ((11/17/2012   Seizure disorder (HCC)    Thyroid  cancer (HCC)      Allergies  Allergen Reactions   Simvastatin  Other (See Comments)    Muscle aches     Current Outpatient Medications  Medication Sig Dispense Refill   acetaminophen  (TYLENOL ) 500 MG tablet Take 1 tablet (500 mg total) by mouth 2 (two) times daily. (Patient taking differently: Take 500 mg by mouth 2 (two) times daily as needed for mild pain (pain score 1-3) or headache.) 60 tablet 5   cinacalcet (SENSIPAR) 30 MG tablet Take 30 mg by mouth daily.     diltiazem  (CARDIZEM  CD) 180 MG 24 hr capsule TAKE ONE CAPSULE BY MOUTH EVERY DAY 30 capsule 1   ELIQUIS  5 MG TABS tablet TAKE ONE TABLET BY MOUTH TWICE DAILY 60 tablet 1   ezetimibe  (ZETIA ) 10 MG tablet TAKE ONE TABLET BY MOUTH EVERY DAY 30 tablet 1   FARXIGA 5 MG TABS tablet Take 5 mg by mouth every morning.     FLUoxetine  (PROZAC ) 10 MG capsule TAKE ONE CAPSULE BY MOUTH DAILY 30 capsule 1   fluticasone  (FLONASE ) 50 MCG/ACT nasal spray Place  2 sprays into both nostrils daily. 11.1 mL 0   hydrALAZINE (APRESOLINE) 50 MG tablet Take 1 tablet by mouth twice daily 180 tablet 0   LAMICTAL  200 MG tablet Take 1 tablet (200 mg total) by mouth 2 (two) times daily. 180 tablet 3   levETIRAcetam  (KEPPRA  XR) 750 MG 24 hr tablet Take 2 tablets twice a day 360 tablet 3   levothyroxine  (SYNTHROID ) 125 MCG tablet Take 1 tablet (125 mcg total) by mouth daily. 90 tablet 3   montelukast  (SINGULAIR ) 10 MG tablet TAKE ONE TABLET BY MOUTH AT BEDTIME 30 tablet 1   olmesartan  (BENICAR ) 40 MG tablet Take 1 tablet (40 mg total) by mouth daily. 100 tablet 3   omeprazole  (PRILOSEC) 20 MG capsule TAKE ONE TABLET BY MOUTH EVERY DAY 30 capsule 1   potassium chloride  (KLOR-CON ) 10 MEQ tablet Take 10 mEq by mouth daily.     rosuvastatin  (CRESTOR ) 40 MG tablet Take 1 tablet (40 mg total) by mouth daily. 100 tablet 3   UNABLE TO FIND HHS Bath chair Bath mat 1 each 0   No current facility-administered medications  for this visit.     Past Surgical History:  Procedure Laterality Date   ABDOMINAL HYSTERECTOMY  2001   BREAST BIOPSY Right 04/16/2020   ADENOSIS   BREAST LUMPECTOMY  1996   right    COLONOSCOPY N/A 05/16/2016   Dr. Harvey: Redundant colon, hemorrhoids, next colonoscopy 10 years   INGUINAL HERNIA REPAIR N/A 08/12/2019   Procedure: laparoscopic incisional hernia repair;  Surgeon: Carolee Sherwood JONETTA DOUGLAS, MD;  Location: [facility];  Service: Urology;  Laterality: N/A;   ROBOTIC ASSITED PARTIAL NEPHRECTOMY Right 08/05/2019   Procedure: XI ROBOTIC ASSITED PARTIAL NEPHRECTOMY WITH INTRAOPERATIVE ULTRASOUND;  Surgeon: Alvaro Hummer, MD;  Location: [facility];  Service: Urology;  Laterality: Right;  3 HRS   THYROIDECTOMY Bilateral 11/17/2012   Procedure: TOTAL THYROIDECTOMY;  Surgeon: Ana LELON Moccasin, MD;  Location: [facility] OR;  Service: ENT;  Laterality: Bilateral;   TOTAL THYROIDECTOMY Bilateral 11/17/2012   TUBAL LIGATION       Allergies  Allergen Reactions    Simvastatin  Other (See Comments)    Muscle aches      Family History  Problem Relation Age of Onset   Diabetes Mother    Colon cancer Neg Hx      Social History Ms. Sibal reports that she has never smoked. She has never used smokeless tobacco. Ms. Goodson reports no history of alcohol use.   Review of Systems CONSTITUTIONAL: No weight loss, fever, chills, weakness or fatigue.  HEENT: Eyes: No visual loss, blurred vision, double vision or yellow sclerae.No hearing loss, sneezing, congestion, runny nose or sore throat.  SKIN: No rash or itching.  CARDIOVASCULAR:  RESPIRATORY: No shortness of breath, cough or sputum.  GASTROINTESTINAL: No anorexia, nausea, vomiting or diarrhea. No abdominal pain or blood.  GENITOURINARY: No burning on urination, no polyuria NEUROLOGICAL: No headache, dizziness, syncope, paralysis, ataxia, numbness or tingling in the extremities. No change in bowel or bladder control.  MUSCULOSKELETAL: No muscle, back pain, joint pain or stiffness.  LYMPHATICS: No enlarged nodes. No history of splenectomy.  PSYCHIATRIC: No history of depression or anxiety.  ENDOCRINOLOGIC: No reports of sweating, cold or heat intolerance. No polyuria or polydipsia.  SABRA   Physical Examination There were no vitals filed for this visit. There were no vitals filed for this visit.  Gen: resting comfortably, no acute distress HEENT: no scleral icterus, pupils equal round and reactive, no palptable cervical adenopathy,  CV Resp: Clear to auscultation bilaterally GI: abdomen is soft, non-tender, non-distended, normal bowel sounds, no hepatosplenomegaly MSK: extremities are warm, no edema.  Skin: warm, no rash Neuro:  no focal deficits Psych: appropriate affect   Diagnostic Studies  07/2019 echo IMPRESSIONS     1. Hyperdynamic LV systolic function; elevated mean gradient across  aortic valve (19 mmHg) suggests mild AS; however hyperdynamic LV function  at least partially  contributing; trace AI; mild TR with severely elevated  pulmonary pressure.   2. Left ventricular ejection fraction, by estimation, is >75%. The left  ventricle has hyperdynamic function. The left ventricle has no regional  wall motion abnormalities. Left ventricular diastolic parameters were  normal.   3. Right ventricular systolic function is normal. The right ventricular  size is normal. There is severely elevated pulmonary artery systolic  pressure.   4. The mitral valve is normal in structure. Trivial mitral valve  regurgitation. No evidence of mitral stenosis.   5. The aortic valve is tricuspid. Aortic valve regurgitation is trivial.  Mild aortic valve stenosis.   6. The inferior  vena cava is normal in size with greater than 50%  respiratory variability, suggesting right atrial pressure of 3 mmHg.     Assessment and Plan   1. PAF/acquired thrombophilia - mild infrequent symptoms, continue current meds including eliquis  for stroke prevention EKG todays shows NSR   2. HTN - at goal on manual receheck, continue current meds   3. Hyperlipidemia - at goal, continue current meds     Dorn PHEBE Ross, M.D., F.A.C.C.

## 2023-09-03 ENCOUNTER — Encounter: Payer: Self-pay | Admitting: Family Medicine

## 2023-09-03 ENCOUNTER — Ambulatory Visit (INDEPENDENT_AMBULATORY_CARE_PROVIDER_SITE_OTHER): Payer: Self-pay | Admitting: Family Medicine

## 2023-09-03 ENCOUNTER — Other Ambulatory Visit (HOSPITAL_COMMUNITY): Payer: Self-pay | Admitting: Family Medicine

## 2023-09-03 VITALS — BP 123/68 | HR 97 | Resp 18 | Ht 65.0 in | Wt 235.0 lb

## 2023-09-03 DIAGNOSIS — F70 Mild intellectual disabilities: Secondary | ICD-10-CM

## 2023-09-03 DIAGNOSIS — I10 Essential (primary) hypertension: Secondary | ICD-10-CM

## 2023-09-03 DIAGNOSIS — R569 Unspecified convulsions: Secondary | ICD-10-CM | POA: Diagnosis not present

## 2023-09-03 DIAGNOSIS — E890 Postprocedural hypothyroidism: Secondary | ICD-10-CM

## 2023-09-03 DIAGNOSIS — E785 Hyperlipidemia, unspecified: Secondary | ICD-10-CM

## 2023-09-03 DIAGNOSIS — Z1231 Encounter for screening mammogram for malignant neoplasm of breast: Secondary | ICD-10-CM

## 2023-09-03 DIAGNOSIS — E559 Vitamin D deficiency, unspecified: Secondary | ICD-10-CM

## 2023-09-03 DIAGNOSIS — Z6841 Body Mass Index (BMI) 40.0 and over, adult: Secondary | ICD-10-CM

## 2023-09-03 MED ORDER — FLUOXETINE HCL 10 MG PO CAPS: 10.0000 mg | ORAL_CAPSULE | Freq: Every day | ORAL | 5 refills | Status: DC

## 2023-09-03 NOTE — Assessment & Plan Note (Signed)
 Hyperlipidemia:Low fat diet discussed and encouraged.   Lipid Panel  Lab Results  Component Value Date   CHOL 159 09/02/2022   HDL 60 09/02/2022   LDLCALC 88 09/02/2022   TRIG 56 09/02/2022   CHOLHDL 2.7 09/02/2022     Updated lab needed at/ before next visit.

## 2023-09-03 NOTE — Assessment & Plan Note (Signed)
 Controlled, no change in medication DASH diet and commitment to daily physical activity for a minimum of 30 minutes discussed and encouraged, as a part of hypertension management. The importance of attaining a healthy weight is also discussed.     09/03/2023    9:55 AM 04/14/2023   10:37 AM 03/16/2023   11:20 AM 12/16/2022    6:35 PM 12/16/2022   11:21 AM 12/16/2022   11:20 AM 11/25/2022   12:01 PM  BP/Weight  Systolic BP 123 -- 109 147 133  139  Diastolic BP 68 -- 63 76 85  72  Wt. (Lbs) 235 244 244.8   240 240  BMI 39.11 kg/m2 40.6 kg/m2 40.74 kg/m2   39.94 kg/m2 39.94 kg/m2

## 2023-09-03 NOTE — Assessment & Plan Note (Signed)
Managed by Endo and controlled 

## 2023-09-03 NOTE — Assessment & Plan Note (Signed)
  Patient re-educated about  the importance of commitment to a  minimum of 150 minutes of exercise per week as able.  The importance of healthy food choices with portion control discussed, as well as eating regularly and within a 12 hour window most days. The need to choose clean , green food 50 to 75% of the time is discussed, as well as to make water  the primary drink and set a goal of 64 ounces water  daily.       09/03/2023    9:55 AM 04/14/2023   10:37 AM 03/16/2023   11:20 AM  Weight /BMI  Weight 235 lb 244 lb 244 lb 12.8 oz  Height 5' 5 (1.651 m) 5' 5 (1.651 m) 5' 5 (1.651 m)  BMI 39.11 kg/m2 40.6 kg/m2 40.74 kg/m2    improved

## 2023-09-03 NOTE — Patient Instructions (Addendum)
 F/U in 5 months, call io you need me sooner  Pls schedule mammogram and dexa at checkout  Lipid, cmp and EGFr,, CBC, magnesium and vit D to be checked today  It is important that you exercise regularly at least 30 minutes 5 times a week. If you develop chest pain, have severe difficulty breathing, or feel very tired, stop exercising immediately and seek medical attention   Think about what you will eat, plan ahead. Choose  clean, green, fresh or frozen over canned, processed or packaged foods which are more sugary, salty and fatty. 70 to 75% of food eaten should be vegetables and fruit. Three meals at set times with snacks allowed between meals, but they must be fruit or vegetables. Aim to eat over a 12 hour period , example 7 am to 7 pm, and STOP after  your last meal of the day. Drink water ,generally about 64 ounces per day, no other drink is as healthy. Fruit juice is best enjoyed in a healthy way, by EATING the fruit. Thanks for choosing [facility], we consider it a privelige to serve you.

## 2023-09-03 NOTE — Assessment & Plan Note (Signed)
 Controlled, followed by Neurology No seizures in past 6 months

## 2023-09-03 NOTE — Progress Notes (Signed)
 [name]     MRN: [id number]      DOB: [birth year]  Chief Complaint  Patient presents with   Hypertension    Follow up     HPI [name] is here for follow up and re-evaluation of chronic medical conditions, medication management and review of any available recent lab and radiology data.  Preventive health is updated, specifically  Cancer screening and Immunization.   Questions or concerns regarding consultations or procedures which the PT has had in the interim are  addressed. The PT denies any adverse reactions to current medications since the last visit.  There are no new concerns.  There are no specific complaints   ROS Denies recent fever or chills. Denies sinus pressure, nasal congestion, ear pain or sore throat. Denies chest congestion, productive cough or wheezing. Denies chest pains, palpitations and leg swelling Denies abdominal pain, nausea, vomiting,diarrhea or constipation.   Denies dysuria, frequency, hesitancy or incontinence. C/o joint pain,  and limitation in mobility. Denies headaches, seizures, numbness, or tingling. Denies depression, anxiety or insomnia. Denies skin break down or rash.   PE  BP 123/68   Pulse 97   Resp 18   Ht 5' 5 (1.651 m)   Wt 235 lb (106.6 kg)   SpO2 94%   BMI 39.11 kg/m   Patient alert and oriented and in no cardiopulmonary distress.  HEENT: No facial asymmetry, EOMI,     Neck supple .  Chest: Clear to auscultation bilaterally.  CVS: S1, S2 no murmurs, no S3.Regular rate.  ABD: Soft non tender.   Ext: No edema  MS: Adequate ROM spine, shoulders, hips and knees.  Skin: Intact, no ulcerations or rash noted.  Psych: Good eye contact, normal affect. Memory intact not anxious or depressed appearing.  CNS: CN 2-12 intact, power,  normal throughout.no focal deficits noted.   Assessment & Plan  Essential hypertension Controlled, no change in medication DASH diet and commitment to daily physical activity for  a minimum of 30 minutes discussed and encouraged, as a part of hypertension management. The importance of attaining a healthy weight is also discussed.     09/03/2023    9:55 AM 04/14/2023   10:37 AM 03/16/2023   11:20 AM 12/16/2022    6:35 PM 12/16/2022   11:21 AM 12/16/2022   11:20 AM 11/25/2022   12:01 PM  BP/Weight  Systolic BP 123 -- 109 147 133  139  Diastolic BP 68 -- 63 76 85  72  Wt. (Lbs) 235 244 244.8   240 240  BMI 39.11 kg/m2 40.6 kg/m2 40.74 kg/m2   39.94 kg/m2 39.94 kg/m2       Hyperlipidemia LDL goal <100 Hyperlipidemia:Low fat diet discussed and encouraged.   Lipid Panel  Lab Results  Component Value Date   CHOL 159 09/02/2022   HDL 60 09/02/2022   LDLCALC 88 09/02/2022   TRIG 56 09/02/2022   CHOLHDL 2.7 09/02/2022     Updated lab needed at/ before next visit.   Vitamin D  deficiency Updated lab needed at/ before next visit.   Seizures (HCC) Controlled, followed by Neurology No seizures in past 6 months  Morbid obesity with BMI of 40.0-44.9, adult [facility])  Patient re-educated about  the importance of commitment to a  minimum of 150 minutes of exercise per week as able.  The importance of healthy food choices with portion control discussed, as well as eating regularly and within a 12 hour window most days. The need to  choose clean , green food 50 to 75% of the time is discussed, as well as to make water  the primary drink and set a goal of 64 ounces water  daily.       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

## 2023-09-03 NOTE — Assessment & Plan Note (Signed)
 Updated lab needed at/ before next visit.

## 2023-09-03 NOTE — Assessment & Plan Note (Signed)
 Has aide 7.5  hours/day

## 2023-09-04 LAB — CBC WITH DIFFERENTIAL/PLATELET
Basophils Absolute: 0.1 x10E3/uL (ref 0.0–0.2)
Basos: 1 %
EOS (ABSOLUTE): 0.1 x10E3/uL (ref 0.0–0.4)
Eos: 1 %
Hematocrit: 38.1 % (ref 34.0–46.6)
Hemoglobin: 12 g/dL (ref 11.1–15.9)
Immature Grans (Abs): 0 x10E3/uL (ref 0.0–0.1)
Immature Granulocytes: 0 %
Lymphocytes Absolute: 1.5 x10E3/uL (ref 0.7–3.1)
Lymphs: 26 %
MCH: 28 pg (ref 26.6–33.0)
MCHC: 31.5 g/dL (ref 31.5–35.7)
MCV: 89 fL (ref 79–97)
Monocytes Absolute: 0.3 x10E3/uL (ref 0.1–0.9)
Monocytes: 5 %
Neutrophils Absolute: 3.9 x10E3/uL (ref 1.4–7.0)
Neutrophils: 67 %
Platelets: 226 x10E3/uL (ref 150–450)
RBC: 4.29 x10E6/uL (ref 3.77–5.28)
RDW: 15 % (ref 11.7–15.4)
WBC: 5.7 x10E3/uL (ref 3.4–10.8)

## 2023-09-04 LAB — CMP14+EGFR
ALT: 14 IU/L (ref 0–32)
AST: 16 IU/L (ref 0–40)
Albumin: 4.3 g/dL (ref 3.9–4.9)
Alkaline Phosphatase: 184 IU/L — ABNORMAL HIGH (ref 44–121)
BUN/Creatinine Ratio: 18 (ref 12–28)
BUN: 49 mg/dL — ABNORMAL HIGH (ref 8–27)
Bilirubin Total: 0.4 mg/dL (ref 0.0–1.2)
CO2: 16 mmol/L — ABNORMAL LOW (ref 20–29)
Calcium: 8.9 mg/dL (ref 8.7–10.3)
Chloride: 105 mmol/L (ref 96–106)
Creatinine, Ser: 2.66 mg/dL — ABNORMAL HIGH (ref 0.57–1.00)
Globulin, Total: 3.8 g/dL (ref 1.5–4.5)
Glucose: 145 mg/dL — ABNORMAL HIGH (ref 70–99)
Potassium: 4.5 mmol/L (ref 3.5–5.2)
Sodium: 141 mmol/L (ref 134–144)
Total Protein: 8.1 g/dL (ref 6.0–8.5)
eGFR: 19 mL/min/1.73 — ABNORMAL LOW (ref >59)

## 2023-09-04 LAB — LIPID PANEL
Chol/HDL Ratio: 2.9 ratio (ref 0.0–4.4)
Cholesterol, Total: 160 mg/dL (ref 100–199)
HDL: 55 mg/dL (ref >39)
LDL Chol Calc (NIH): 92 mg/dL (ref 0–99)
Triglycerides: 65 mg/dL (ref 0–149)
VLDL Cholesterol Cal: 13 mg/dL (ref 5–40)

## 2023-09-04 LAB — MAGNESIUM: Magnesium: 1.9 mg/dL (ref 1.6–2.3)

## 2023-09-04 LAB — VITAMIN D 25 HYDROXY (VIT D DEFICIENCY, FRACTURES): Vit D, 25-Hydroxy: 43.9 ng/mL (ref 30.0–100.0)

## 2023-09-09 ENCOUNTER — Ambulatory Visit: Payer: Self-pay | Admitting: Family Medicine

## 2023-09-09 DIAGNOSIS — R748 Abnormal levels of other serum enzymes: Secondary | ICD-10-CM

## 2023-09-16 ENCOUNTER — Encounter: Payer: Self-pay | Admitting: Gastroenterology

## 2023-09-18 ENCOUNTER — Ambulatory Visit (INDEPENDENT_AMBULATORY_CARE_PROVIDER_SITE_OTHER): Admitting: Neurology

## 2023-09-18 ENCOUNTER — Encounter: Payer: Self-pay | Admitting: Neurology

## 2023-09-18 VITALS — BP 114/75 | HR 85 | Ht 65.0 in | Wt 236.0 lb

## 2023-09-18 DIAGNOSIS — G40009 Localization-related (focal) (partial) idiopathic epilepsy and epileptic syndromes with seizures of localized onset, not intractable, without status epilepticus: Secondary | ICD-10-CM

## 2023-09-18 MED ORDER — LAMICTAL 200 MG PO TABS: 200.0000 mg | ORAL_TABLET | Freq: Two times a day (BID) | ORAL | 3 refills | Status: AC

## 2023-09-18 MED ORDER — LEVETIRACETAM ER 750 MG PO TB24: ORAL_TABLET | ORAL | 3 refills | Status: AC

## 2023-09-18 NOTE — Patient Instructions (Signed)
Good to see you doing well. Continue all your medications. Follow-up in 8 months, call for any changes.    Seizure Precautions: 1. If medication has been prescribed for you to prevent seizures, take it exactly as directed.  Do not stop taking the medicine without talking to your doctor first, even if you have not had a seizure in a long time.   2. Avoid activities in which a seizure would cause danger to yourself or to others.  Don't operate dangerous machinery, swim alone, or climb in high or dangerous places, such as on ladders, roofs, or girders.  Do not drive unless your doctor says you may.  3. If you have any warning that you may have a seizure, lay down in a safe place where you can't hurt yourself.    4.  No driving for 6 months from last seizure, as per [name].   Please refer to the following link on the [employer]'s website for more information: http://www.epilepsyfoundation.org/answerplace/Social/driving/drivingu.cfm   5.  Maintain good sleep hygiene.  6.  Contact your doctor if you have any problems that may be related to the medicine you are taking.  7.  Call 911 and bring the patient back to the ED if:        A.  The seizure lasts longer than 5 minutes.       B.  The patient doesn't awaken shortly after the seizure  C.  The patient has new problems such as difficulty seeing, speaking or moving  D.  The patient was injured during the seizure  E.  The patient has a temperature over 102 F (39C)  F.  The patient vomited and now is having trouble breathing

## 2023-09-18 NOTE — Progress Notes (Signed)
 NEUROLOGY FOLLOW UP OFFICE NOTE  [name] [id number] 12/29/1955  HISTORY OF PRESENT ILLNESS: I had the pleasure of seeing Truda Staub in follow-up in the neurology clinic on 09/18/2067.  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 She is alone in the office today, no family/caregiver to corroborate history. She lives alone and has an aide who comes daily to help with meals, medications that come in a pillpack. She denies any seizures since 08/03/2022. Aide comes daily and has not mentioned any concerns to her. She is on brand Lamictal  200mg  BID and Levetiracetam  ER 1500mg  BID (750mg  2 tabs BID) without side effects. She denies any staring/unresponsive episodes, gaps in time, olfactory/gustatory hallucinations, focal numbness/tingling/weakness, myoclonic jerks. No headaches, dizziness, vision changes, no falls. Sleep and mood are good.    History on Initial Assessment 01/23/2068: This is a 68 year old right-handed woman with a history of hypertension, hyperlipidemia, hypothyroidism, atrial fibrillation on Eliquis , partial nephrectomy, mild intellectual disability, presenting to establish care for seizures as Dr. Milton is closing his office. She is a poor historian but able to answer questions adequately. She states seizures started as a baby. She does not recall prior medications she had taken. She reports that she has been having little ones that want to come, she feels like she is dizzy and going out of my head. She felt this way this morning. She feels this just started when they changed my medications. Last note from Dr. Vinson office from June 2023 indicate that her Levetiracetam  was switched to extended-release. She was in the ER the day prior on 08/12/21 after having 2 seizures at home. Her daughter had previously described seizures as spacing out then collapsing and shaking with tongue bite, sleepiness and disorientation after. She states  this was the last time she had a GTC. She has been told she would stare off, the last time she was told was some time this month. Sometimes she notices a metallic taste/dry mouth. She denies any focal numbness/tingling/weakness or myoclonic jerks. She has pain on her right knee from arthritis. She denies any headaches, diplopia, dysarthria/dysphagia, neck/back pain, bowel/bladder dysfunction. She reports sleeping well, then stated she wakes up several times at night, no daytime drowsiness. She lives alone, her daughter lives next door. She has an aide coming daily to helps with meals. Her daughter manages finances. She manages her own medications with a pillpack and denies missing any doses. On her last visit with Dr. Milton, daughter was concerned she was not taking medications properly despite being prepackaged. She is on Levetiracetam  ER 750mg  2 tabs BID (1500mg  BID) and Lamotrigine  150mg  1/2 tablet BID (75mg  BID). Per notes, she did not tolerate higher dose of Lamotrigine  and was sleepy on immediate-release Levetiracetam .   Epilepsy Risk Factors:  Mild intellectual disability. There is no history of febrile convulsions, CNS infections such as meningitis/encephalitis, significant traumatic brain injury, neurosurgical procedures, or family history of seizures.  Diagnostic Data: I personally reviewed MRI brain without contrast done 06/2013 with asymmetric atrophy and mildly increased T2 signal in the left hippocampus. Mild chronic microvascular disease. There was marked, disproportionate cerebellar atrophy.  brain MRI with and without contrast done 01/2022 did not show any acute changes. There was again note of mild asymmetric atrophy and increased T2 signal in the left hippocampus, cerebellar atrophy, and mild chronic microvascular disease.   EEG from 2020 was normal.  EEG in 01/2022 was within normal limits.   Prior  ASMs: she did not tolerate higher dose of Lamotrigine , more seizures on generic  Lamotrigine , and was sleepy on immediate-release Levetiracetam .   PAST MEDICAL HISTORY: Past Medical History:  Diagnosis Date   A-fib (HCC)    Anemia    Depression    Dyspnea    Fractures    Hyperlipidemia    Hypertension    Mental retardation, mild (I.Q. 50-70)    lives alone and cares for self has a Engineer, civil (consulting) aide comes once a day    Obesity    Pneumonia    PONV (postoperative nausea and vomiting)    Seizure (HCC)    11/17/2012 still having them; I believe their from a tumor in my head ((11/17/2012   Seizure disorder (HCC)    Thyroid  cancer (HCC)     MEDICATIONS: Current Outpatient Medications on File Prior to Visit  Medication Sig Dispense Refill   acetaminophen  (TYLENOL ) 500 MG tablet Take 1 tablet (500 mg total) by mouth 2 (two) times daily. 60 tablet 5   cinacalcet (SENSIPAR) 30 MG tablet Take 30 mg by mouth daily.     diltiazem  (CARDIZEM  CD) 180 MG 24 hr capsule TAKE ONE CAPSULE BY MOUTH EVERY DAY 30 capsule 1   ELIQUIS  5 MG TABS tablet TAKE ONE TABLET BY MOUTH TWICE DAILY 60 tablet 1   ezetimibe  (ZETIA ) 10 MG tablet TAKE ONE TABLET BY MOUTH EVERY DAY 30 tablet 1   FARXIGA 5 MG TABS tablet Take 5 mg by mouth every morning.     FLUoxetine  (PROZAC ) 10 MG capsule Take 1 capsule (10 mg total) by mouth daily. 30 capsule 5   fluticasone  (FLONASE ) 50 MCG/ACT nasal spray Place 2 sprays into both nostrils daily. 11.1 mL 0   hydrALAZINE (APRESOLINE) 50 MG tablet Take 1 tablet by mouth twice daily 180 tablet 0   LAMICTAL  200 MG tablet Take 1 tablet (200 mg total) by mouth 2 (two) times daily. 180 tablet 3   levETIRAcetam  (KEPPRA  XR) 750 MG 24 hr tablet Take 2 tablets twice a day 360 tablet 3   levothyroxine  (SYNTHROID ) 125 MCG tablet Take 1 tablet (125 mcg total) by mouth daily. 90 tablet 3   montelukast  (SINGULAIR ) 10 MG tablet TAKE ONE TABLET BY MOUTH AT BEDTIME 30 tablet 1   olmesartan  (BENICAR ) 40 MG tablet Take 1 tablet (40 mg total) by mouth daily. 100 tablet 3   omeprazole   (PRILOSEC) 20 MG capsule TAKE ONE TABLET BY MOUTH EVERY DAY 30 capsule 1   potassium chloride  (KLOR-CON ) 10 MEQ tablet Take 10 mEq by mouth daily.     rosuvastatin  (CRESTOR ) 40 MG tablet Take 1 tablet (40 mg total) by mouth daily. 100 tablet 3   UNABLE TO FIND HHS Bath chair Bath mat 1 each 0   No current facility-administered medications on file prior to visit.    ALLERGIES: Allergies  Allergen Reactions   Simvastatin  Other (See Comments)    Muscle aches    FAMILY HISTORY: Family History  Problem Relation Age of Onset   Diabetes Mother    Colon cancer Neg Hx     SOCIAL HISTORY: Social History   Socioeconomic History   Marital status: Single    Spouse name: Not on file   Number of children: 2   Years of education: Not on file   Highest education level: Not on file  Occupational History   Occupation: disabled   Tobacco Use   Smoking status: Never   Smokeless tobacco: Never  Vaping Use  Vaping status: Never Used  Substance and Sexual Activity   Alcohol use: No   Drug use: No   Sexual activity: Never  Other Topics Concern   Not on file  Social History Narrative   Right handed   Caffeine intake yes   Retired   Lives alone   Social Drivers of Health   Financial Resource Strain: Low Risk  (04/14/2023)   Overall Financial Resource Strain (CARDIA)    Difficulty of Paying Living Expenses: Not hard at all  Food Insecurity: No Food Insecurity (04/14/2023)   Hunger Vital Sign    Worried About Running Out of Food in the Last Year: Never true    Ran Out of Food in the Last Year: Never true  Transportation Needs: No Transportation Needs (04/14/2023)   PRAPARE - Administrator, Civil Service (Medical): No    Lack of Transportation (Non-Medical): No  Physical Activity: Insufficiently Active (04/14/2023)   Exercise Vital Sign    Days of Exercise per Week: 3 days    Minutes of Exercise per Session: 20 min  Stress: No Stress Concern Present (04/14/2023)    [employer] of Occupational Health - Occupational Stress Questionnaire    Feeling of Stress : Not at all  Social Connections: Socially Isolated (04/14/2023)   Social Connection and Isolation Panel    Frequency of Communication with Friends and Family: Once a week    Frequency of Social Gatherings with Friends and Family: Once a week    Attends Religious Services: More than 4 times per year    Active Member of [employer] or Organizations: No    Attends Banker Meetings: Never    Marital Status: Never married  Intimate Partner Violence: Not At Risk (04/14/2023)   Humiliation, Afraid, Rape, and Kick questionnaire    Fear of Current or Ex-Partner: No    Emotionally Abused: No    Physically Abused: No    Sexually Abused: No     PHYSICAL EXAM: Vitals:   09/18/23 1420  BP: 114/75  Pulse: 85  SpO2: 97%   General: No acute distress Head:  Normocephalic/atraumatic Skin/Extremities: No rash, no edema Neurological Exam: alert and awake. No aphasia or dysarthria. Fund of knowledge is appropriate.  Attention and concentration are normal.   Cranial nerves: Pupils equal, round. Extraocular movements intact with no nystagmus. Visual fields full.  No facial asymmetry.  Motor: Bulk and tone normal, muscle strength 5/5 throughout with no pronator drift.   Finger to nose testing intact.  Gait narrow-based and steady, no ataxia. No tremors.    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 MRI brain showed asymmetric atrophy and increased signal in the left hippocampus, EEG normal. She denies any seizures since June 2024 however she is alone with no family/caregiver to corroborate history. Continue brand Lamictal  200mg  BID and Levetiracetam  ER 1500mg  BID, refills sent. She does not drive. Continue close supervision.  Follow-up in 8 months, call for any changes.    Thank you for allowing me to participate in her care.  Please do not hesitate to call for any questions or concerns.    Darice Shivers, M.D.   CC: Dr. Antonetta

## 2023-09-22 ENCOUNTER — Other Ambulatory Visit: Payer: Self-pay | Admitting: *Deleted

## 2023-09-22 DIAGNOSIS — C73 Malignant neoplasm of thyroid gland: Secondary | ICD-10-CM

## 2023-09-22 DIAGNOSIS — E890 Postprocedural hypothyroidism: Secondary | ICD-10-CM

## 2023-10-02 NOTE — Patient Instructions (Signed)

## 2023-10-03 LAB — TSH: TSH: 10.6 u[IU]/mL — ABNORMAL HIGH (ref 0.450–4.500)

## 2023-10-03 LAB — T4, FREE: Free T4: 1.45 ng/dL (ref 0.82–1.77)

## 2023-10-05 ENCOUNTER — Ambulatory Visit (INDEPENDENT_AMBULATORY_CARE_PROVIDER_SITE_OTHER): Payer: Medicare Other | Admitting: Nurse Practitioner

## 2023-10-05 ENCOUNTER — Encounter: Payer: Self-pay | Admitting: Nurse Practitioner

## 2023-10-05 VITALS — BP 116/70 | HR 74 | Ht 65.0 in | Wt 232.6 lb

## 2023-10-05 DIAGNOSIS — E890 Postprocedural hypothyroidism: Secondary | ICD-10-CM

## 2023-10-05 DIAGNOSIS — C73 Malignant neoplasm of thyroid gland: Secondary | ICD-10-CM | POA: Diagnosis not present

## 2023-10-05 MED ORDER — LEVOTHYROXINE SODIUM 125 MCG PO TABS: 125.0000 ug | ORAL_TABLET | Freq: Every day | ORAL | 3 refills | Status: DC

## 2023-10-05 NOTE — Progress Notes (Signed)
 10/05/2023      Endocrinology follow-up note     Subjective:    Patient ID: [name], female    DOB: 1955/08/06,    Past Medical History:  Diagnosis Date   A-fib (HCC)    Anemia    Depression    Dyspnea    Fractures    Hyperlipidemia    Hypertension    Mental retardation, mild (I.Q. 50-70)    lives alone and cares for self has a nurse aide comes once a day    Obesity    Pneumonia    PONV (postoperative nausea and vomiting)    Seizure (HCC)    11/17/2012 still having them; I believe their from a tumor in my head ((11/17/2012   Seizure disorder (HCC)    Thyroid  cancer [facility])    Past Surgical History:  Procedure Laterality Date   ABDOMINAL HYSTERECTOMY  2001   BREAST BIOPSY Right 04/16/2020   ADENOSIS   BREAST LUMPECTOMY  1996   right    COLONOSCOPY N/A 05/16/2016   Dr. Harvey: Redundant colon, hemorrhoids, next colonoscopy 10 years   INGUINAL HERNIA REPAIR N/A 08/12/2019   Procedure: laparoscopic incisional hernia repair;  Surgeon: Carolee Sherwood JONETTA DOUGLAS, MD;  Location: [facility];  Service: Urology;  Laterality: N/A;   ROBOTIC ASSITED PARTIAL NEPHRECTOMY Right 08/05/2019   Procedure: XI ROBOTIC ASSITED PARTIAL NEPHRECTOMY WITH INTRAOPERATIVE ULTRASOUND;  Surgeon: Alvaro Hummer, MD;  Location: [facility];  Service: Urology;  Laterality: Right;  3 HRS   THYROIDECTOMY Bilateral 11/17/2012   Procedure: TOTAL THYROIDECTOMY;  Surgeon: Ana LELON Moccasin, MD;  Location: [facility] OR;  Service: ENT;  Laterality: Bilateral;   TOTAL THYROIDECTOMY Bilateral 11/17/2012   TUBAL LIGATION     Social History   Socioeconomic History   Marital status: Single    Spouse name: Not on file   Number of children: 2   Years of education: Not on file   Highest education level: Not on file  Occupational History   Occupation: disabled   Tobacco Use   Smoking status: Never   Smokeless tobacco: Never  Vaping Use   Vaping status: Never Used  Substance and Sexual Activity   Alcohol use: No   Drug  use: No   Sexual activity: Never  Other Topics Concern   Not on file  Social History Narrative   Right handed   Caffeine intake yes   Retired   Lives alone   Social Drivers of Health   Financial Resource Strain: Low Risk  (04/14/2023)   Overall Financial Resource Strain (CARDIA)    Difficulty of Paying Living Expenses: Not hard at all  Food Insecurity: No Food Insecurity (04/14/2023)   Hunger Vital Sign    Worried About Running Out of Food in the Last Year: Never true    Ran Out of Food in the Last Year: Never true  Transportation Needs: No Transportation Needs (04/14/2023)   PRAPARE - Administrator, Civil Service (Medical): No    Lack of Transportation (Non-Medical): No  Physical Activity: Insufficiently Active (04/14/2023)   Exercise Vital Sign    Days of Exercise per Week: 3 days    Minutes of Exercise per Session: 20 min  Stress: No Stress Concern Present (04/14/2023)   [employer] of Occupational Health - Occupational Stress Questionnaire    Feeling of Stress : Not at all  Social Connections: Socially Isolated (04/14/2023)   Social Connection and Isolation Panel    Frequency of Communication  with Friends and Family: Once a week    Frequency of Social Gatherings with Friends and Family: Once a week    Attends Religious Services: More than 4 times per year    Active Member of [employer] or Organizations: No    Attends Banker Meetings: Never    Marital Status: Never married   Outpatient Encounter Medications as of 10/05/2023  Medication Sig   acetaminophen  (TYLENOL ) 500 MG tablet Take 1 tablet (500 mg total) by mouth 2 (two) times daily.   cinacalcet (SENSIPAR) 30 MG tablet Take 30 mg by mouth daily.   diltiazem  (CARDIZEM  CD) 180 MG 24 hr capsule TAKE ONE CAPSULE BY MOUTH EVERY DAY   ELIQUIS  5 MG TABS tablet TAKE ONE TABLET BY MOUTH TWICE DAILY   ezetimibe  (ZETIA ) 10 MG tablet TAKE ONE TABLET BY MOUTH EVERY DAY   FARXIGA 5 MG TABS tablet Take 5 mg  by mouth every morning.   FLUoxetine  (PROZAC ) 10 MG capsule Take 1 capsule (10 mg total) by mouth daily.   fluticasone  (FLONASE ) 50 MCG/ACT nasal spray Place 2 sprays into both nostrils daily.   hydrALAZINE (APRESOLINE) 50 MG tablet Take 1 tablet by mouth twice daily   LAMICTAL  200 MG tablet Take 1 tablet (200 mg total) by mouth 2 (two) times daily.   levETIRAcetam  (KEPPRA  XR) 750 MG 24 hr tablet Take 2 tablets twice a day   montelukast  (SINGULAIR ) 10 MG tablet TAKE ONE TABLET BY MOUTH AT BEDTIME   olmesartan  (BENICAR ) 40 MG tablet Take 1 tablet (40 mg total) by mouth daily.   omeprazole  (PRILOSEC) 20 MG capsule TAKE ONE TABLET BY MOUTH EVERY DAY   potassium chloride  (KLOR-CON ) 10 MEQ tablet Take 10 mEq by mouth daily.   UNABLE TO FIND HHS Bath chair Bath mat   [DISCONTINUED] levothyroxine  (SYNTHROID ) 125 MCG tablet Take 1 tablet (125 mcg total) by mouth daily.   levothyroxine  (SYNTHROID ) 125 MCG tablet Take 1 tablet (125 mcg total) by mouth daily.   rosuvastatin  (CRESTOR ) 40 MG tablet Take 1 tablet (40 mg total) by mouth daily.   No facility-administered encounter medications on file as of 10/05/2023.   ALLERGIES: Allergies  Allergen Reactions   Simvastatin  Other (See Comments)    Muscle aches   VACCINATION STATUS: Immunization History  Administered Date(s) Administered   Fluad Quad(high Dose 65+) 11/29/2019, 11/29/2020, 10/31/2021   Fluad Trivalent(High Dose 65+) 11/05/2022   H1N1 12/16/2007   Influenza Split 11/24/2011   Influenza Whole 11/30/2006, 12/07/2008, 12/26/2009   Influenza,inj,Quad PF,6+ Mos 11/18/2012, 12/06/2013, 05/23/2015, 01/29/2016, 12/01/2016, 01/18/2018, 12/01/2018   Pneumococcal Conjugate-13 04/03/2014   Pneumococcal Polysaccharide-23 05/10/2019   Td 08/22/2003   Tdap 04/02/2016   Unspecified SARS-COV-2 Vaccination 05/30/2019, 04/20/2020   Zoster Recombinant(Shingrix) 12/05/2020, 05/01/2021    HPI  68 year old female patient with medical history as  above.  She is here to follow-up for post surgical hypothyroidism and for follow-up of Papillary thyroid  cancer.  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 She has had thyrogen  stimulated remnant ablation with negative post therapy WBS in 2014.  - Second Thyrogen  Stimulated whole body scan for surveillance shows no scintigraphic evidence of iodine avid metastasis papillary thyroid  cancer on 04/18/2016.  - Most recent surveillance imaging with Thyrogen  stimulated whole-body scan on September 27, 2018 showed no evidence of tumor recurrence.  -Her ultrasound from November 07, 2019 was consistent with surgical changes with no  new sign of recurrence or residual tumor.  Her last thyroid  imaging on November 07, 2019 also confirmed stable atrophic heterogenous residual right thyroid  lobe measuring 2.5 cm x 1.2 cm.  Stability documented since 2019.   She is now on Synthroid  125 mcg p.o. daily before breakfast.  -She has steady weight since last visit.  She has no new complaints.     Review of systems  Constitutional: + Minimally fluctuating body weight,  current Body mass index is 38.71 kg/m. , no fatigue, + subjective hyperthermia, no subjective hypothermia Eyes: no blurry vision, no xerophthalmia ENT:  no nodules palpated in throat, + intermittent dysphagia/odynophagia- notes left side has been sore, no hoarseness Cardiovascular: no chest pain, no shortness of breath, no palpitations, no leg swelling Respiratory: no cough, no shortness of breath Gastrointestinal: no nausea/vomiting/diarrhea Musculoskeletal: no muscle/joint aches Skin: no rashes, no hyperemia Neurological: no tremors, no numbness, no tingling, no dizziness Psychiatric: no depression, no anxiety  Objective:    BP 116/70 (BP Location: Right Arm, Patient Position: Sitting, Cuff Size: Large)   Pulse 74    Ht 5' 5 (1.651 m)   Wt 232 lb 9.6 oz (105.5 kg)   BMI 38.71 kg/m   Wt Readings from Last 3 Encounters:  10/05/23 232 lb 9.6 oz (105.5 kg)  09/18/23 236 lb (107 kg)  09/03/23 235 lb (106.6 kg)    BP Readings from Last 3 Encounters:  10/05/23 116/70  09/18/23 114/75  09/03/23 123/68    Physical Exam- Limited  Constitutional:  Body mass index is 38.71 kg/m. , not in acute distress, normal state of mind Eyes:  EOMI, no exophthalmos Skin:  no rashes, no hyperemia Neurological: no tremor with outstretched hands   Results for orders placed or performed in visit on 09/03/23  Lipid panel   Collection Time: 09/03/23 10:31 AM  Result Value Ref Range   Cholesterol, Total 160 100 - 199 mg/dL   Triglycerides 65 0 - 149 mg/dL   HDL 55 >60 mg/dL   VLDL Cholesterol Cal 13 5 - 40 mg/dL   LDL Chol Calc (NIH) 92 0 - 99 mg/dL   Chol/HDL Ratio 2.9 0.0 - 4.4 ratio  CMP14+EGFR   Collection Time: 09/03/23 10:31 AM  Result Value Ref Range   Glucose 145 (H) 70 - 99 mg/dL   BUN 49 (H) 8 - 27 mg/dL   Creatinine, Ser 7.33 (H) 0.57 - 1.00 mg/dL   eGFR 19 (L) >40 fO/fpw/8.26   BUN/Creatinine Ratio 18 12 - 28   Sodium 141 134 - 144 mmol/L   Potassium 4.5 3.5 - 5.2 mmol/L   Chloride 105 96 - 106 mmol/L   CO2 16 (L) 20 - 29 mmol/L   Calcium  8.9 8.7 - 10.3 mg/dL   Total Protein 8.1 6.0 - 8.5 g/dL   Albumin  4.3 3.9 - 4.9 g/dL   Globulin, Total 3.8 1.5 - 4.5 g/dL   Bilirubin Total 0.4 0.0 - 1.2 mg/dL   Alkaline Phosphatase 184 (H) 44 - 121 IU/L   AST 16 0 - 40 IU/L   ALT 14 0 - 32 IU/L  CBC with Differential/Platelet   Collection Time: 09/03/23 10:31 AM  Result Value Ref Range   WBC 5.7 3.4 - 10.8 x10E3/uL   RBC 4.29 3.77 - 5.28 x10E6/uL   Hemoglobin 12.0 11.1 - 15.9 g/dL   Hematocrit 61.8 65.9 - 46.6 %   MCV 89 79 - 97 fL   MCH 28.0 26.6 - 33.0 pg   MCHC  31.5 31.5 - 35.7 g/dL   RDW 84.9 88.2 - 84.5 %   Platelets 226 150 - 450 x10E3/uL   Neutrophils 67 Not Estab. %   Lymphs 26 Not  Estab. %   Monocytes 5 Not Estab. %   Eos 1 Not Estab. %   Basos 1 Not Estab. %   Neutrophils Absolute 3.9 1.4 - 7.0 x10E3/uL   Lymphocytes Absolute 1.5 0.7 - 3.1 x10E3/uL   Monocytes Absolute 0.3 0.1 - 0.9 x10E3/uL   EOS (ABSOLUTE) 0.1 0.0 - 0.4 x10E3/uL   Basophils Absolute 0.1 0.0 - 0.2 x10E3/uL   Immature Granulocytes 0 Not Estab. %   Immature Grans (Abs) 0.0 0.0 - 0.1 x10E3/uL  VITAMIN D  25 Hydroxy (Vit-D Deficiency, Fractures)   Collection Time: 09/03/23 10:31 AM  Result Value Ref Range   Vit D, 25-Hydroxy 43.9 30.0 - 100.0 ng/mL  Magnesium   Collection Time: 09/03/23 10:31 AM  Result Value Ref Range   Magnesium 1.9 1.6 - 2.3 mg/dL   Complete Blood Count (Most recent): Lab Results  Component Value Date   WBC 5.7 09/03/2023   HGB 12.0 09/03/2023   HCT 38.1 09/03/2023   MCV 89 09/03/2023   PLT 226 09/03/2023   Diabetic Labs (most recent): Lab Results  Component Value Date   HGBA1C 5.2 05/26/2017   HGBA1C 5.3 08/10/2012   Lipid Panel     Component Value Date/Time   CHOL 160 09/03/2023 1031   TRIG 65 09/03/2023 1031   HDL 55 09/03/2023 1031   CHOLHDL 2.9 09/03/2023 1031   CHOLHDL 3.3 10/06/2019 0951   VLDL 14 01/29/2016 1109   LDLCALC 92 09/03/2023 1031   LDLCALC 80 10/06/2019 0951    June 01, 2017  IMPRESSION:  Questioned approximately 2.2 cm soft tissue within the right lobectomy resection bed appears similar to the 07/2015 examination.   Note, while nonspecific, this tissue was apparently present at the time of the Thyrogen  stimulated I 131 nuclear medicine whole body scan performed 03/2016 which was negative for the presence of iodine avid metastatic thyroid  cancer or residual disease, and thus favored to be benign etiology. Clinical correlation is advised.  Thyrogen  stimulated whole-body scan September 27, 2018 fINDINGS:  Excreted tracer within colon and urinary bladder. Small amount of retained gastric activity. Pattern is similar to that seen on the  previous exam.   No sites of iodine-avid metastatic thyroid  cancer identified. No residual tracer accumulation in the neck.   IMPRESSION: No scintigraphic evidence of iodine-avid metastatic thyroid  cancer.     Ultrasound of thyroid  on November 07, 2019 -Right lobe 2.5 x 1.2 x 0.9 cm, left lobe surgically absent. Stable atrophic heterogeneous residual right thyroid  lobe as before. No hypervascularity. No left thyroid  bed abnormality. No regional adenopathy.   IMPRESSION: Stable thyroid  ultrasound compared 2019 as above.   Latest Reference Range & Units 09/24/21 09:22 04/02/22 11:21 08/03/22 04:15 09/29/22 09:19 10/02/23 09:17  TSH 0.450 - 4.500 uIU/mL 0.071 (L) 0.08 (L) 2.451 1.220 10.600 (H)  T4,Free(Direct) 0.82 - 1.77 ng/dL 8.05 (H) 1.4  8.33 8.54  (L): Data is abnormally low (H): Data is abnormally high Assessment & Plan:   1. Hypothyroidism, postsurgical -Her previsit thyroid  function tests are consistent with under-replacement, however she does admit she has been taking her Levothyroxine  medication with all her others in the morning (including a PPI).  Therefore she will be kept on same dose of thyroid  medication for now at Levothyroxine  125 mcg po daily before breakfast.  Will  recheck TFTs in 4 months after taking medication properly and adjust dose accordingly.   - We discussed about the correct intake of her thyroid  hormone, on empty stomach at fasting, with water , separated by at least 30 minutes from breakfast and other medications,  and separated by more than 4 hours from calcium , iron, multivitamins, acid reflux medications (PPIs). -Patient is made aware of the fact that thyroid  hormone replacement is needed for life, dose to be adjusted by periodic monitoring of thyroid  function tests.  - Target TSH for her would be between 0.1-0.5.  2. Papillary thyroid  carcinoma (HCC) Her surveillance neck /thyroid  u/s from 11/17/2013 was c/w surgically absent thyroid , however there  is a 0.9 cm left cervical lymph node. See below.  Pt is s/p thyrogen  stimulated thyroid  remnant ablation with WBS on 01/14/2013 with no evidence of iodine avid metastasis. She has had pT2,Nx,Mx FVPTC, s/p near total thyroidectomy on September 24,2014. She is counseled to maintain high degree of compliance with synthroid  therapy with aim of keeping TSH low normal ( 0.1-0.5).  -she is advised on the necessity of follow-ups and imaging studies at least one annually for the next 5 years. - She was sent for core biopsy of 0.9 cm left cervical lymph node last visit. However, this procedure was abandoned due to  no evidence of adenopathy.  The previously noticed  left cervical lymph node was sent to decrease in size to 0.5 cm. Medicaid did not cover surveillance ultrasound. - Second Thyrogen  estimated or body scan for surveillance shows no scintigraphic evidence of iodine avid metastasis papillary thyroid  cancer on 04/18/2016. -June 01, 2017 surveillance thyroid /neck ultrasound : Unremarkable, see above.   -She underwent  Thyrogen  stimulated whole body scan which is negative for tumor recurrence.   Her recent thyroid /neck ultrasound from September 2021 showed stable atrophic heterogenous residual thyroid  lobe measuring 2.5 x1.2 cm observed to have been stable since 2019.  She will not need any immediate intervention for this at this time.   July 2023, ultrasound showed no suspicious findings.    She is advised to keep close follow-up with Dr. Rollene Pesa for primary care needs.     I spent  22  minutes in the care of the patient today including review of labs from Thyroid  Function, CMP, and other relevant labs ; imaging/biopsy records (current and previous including abstractions from other facilities); face-to-face time discussing  her lab results and symptoms, medications doses, her options of short and long term treatment based on the latest standards of care / guidelines;   and documenting  the encounter.  [name]  participated in the discussions, expressed understanding, and voiced agreement with the above plans.  All questions were answered to her satisfaction. she is encouraged to contact clinic should she have any questions or concerns prior to her return visit.   Follow up plan: Return in about 4 months (around 02/04/2024) for Thyroid  follow up, Previsit labs.  Benton Rio, [facility] [address] [place], [place] [address] Phone: [phone] Fax: [phone]   10/05/2023, 11:16 AM

## 2023-10-06 ENCOUNTER — Ambulatory Visit: Admitting: Gastroenterology

## 2023-10-06 ENCOUNTER — Encounter: Payer: Self-pay | Admitting: Nurse Practitioner

## 2023-10-06 ENCOUNTER — Ambulatory Visit: Attending: Nurse Practitioner | Admitting: Nurse Practitioner

## 2023-10-06 VITALS — BP 122/70 | HR 64 | Ht 65.0 in | Wt 232.4 lb

## 2023-10-06 DIAGNOSIS — E785 Hyperlipidemia, unspecified: Secondary | ICD-10-CM | POA: Diagnosis not present

## 2023-10-06 DIAGNOSIS — R011 Cardiac murmur, unspecified: Secondary | ICD-10-CM | POA: Diagnosis not present

## 2023-10-06 DIAGNOSIS — E669 Obesity, unspecified: Secondary | ICD-10-CM

## 2023-10-06 DIAGNOSIS — I10 Essential (primary) hypertension: Secondary | ICD-10-CM | POA: Diagnosis not present

## 2023-10-06 DIAGNOSIS — N184 Chronic kidney disease, stage 4 (severe): Secondary | ICD-10-CM

## 2023-10-06 DIAGNOSIS — I480 Paroxysmal atrial fibrillation: Secondary | ICD-10-CM

## 2023-10-06 NOTE — Progress Notes (Unsigned)
 Cardiology Office Note:    Date:  10/06/2023 ID:  Christine, Huffman 02/25/1955, MRN [id number] PCP:  Antonetta Rollene BRAVO, MD [facility] Providers Cardiologist:  Alvan Carrier, MD Cardiology APP:  Miriam Norris, NP    Referring MD: Antonetta Rollene BRAVO, MD   CC: Here for overdue 1 year follow-up  History of Present Illness:    [name] is a 68 y.o. female with a PMH of PAF, hypertension, hyperlipidemia, history of thyroid  cancer, obesity, and CKD stage IV, who presents today for 1 year follow-up.  Paroxysmal A-fib appear to be new diagnosis in 2021 during admission with small bowel obstruction.  Last seen by Dr. Carrier Alvan on February 06, 2022.  At that time, [name] reported infrequent palpitations and was compliant with her medications.  Denied any bleeding issues while on Eliquis .  EKG in office showed normal sinus rhythm.  No medication changes were made.  Was told to follow-up in 1 year.   04/07/2022 - [name] presents today at the request of her PCP, Dr. Rollene Antonetta. [name] states my doctor wanted me to get checked out.  I have reviewed with her the last visit with Dr. Carrier Alvan around 2 months ago.  Patient states [name] continues wanted to be evaluated today.  [name] denies any acute cardiac concerns or complaints.  No changes since [name] was last seen by Dr. Alvan. Denies any chest pain, shortness of breath, palpitations, syncope, presyncope, dizziness, orthopnea, PND, swelling or significant weight changes, acute bleeding, or claudication.  Tolerating her medications well.  10/06/2023 -  Here for follow-up.  Doing well denies any acute cardiac complaints or issues. Denies any chest pain, shortness of breath, palpitations, syncope, presyncope, dizziness, orthopnea, PND, swelling or significant weight changes, acute bleeding, or claudication.  SH: [name] enjoys walking regularly and watching Family Feud on TV.   Past Medical History:  Diagnosis Date   A-fib (HCC)     Anemia    Depression    Dyspnea    Fractures    Hyperlipidemia    Hypertension    Mental retardation, mild (I.Q. 50-70)    lives alone and cares for self has a nurse aide comes once a day    Obesity    Pneumonia    PONV (postoperative nausea and vomiting)    Seizure (HCC)    11/17/2012 still having them; I believe their from a tumor in my head ((11/17/2012   Seizure disorder (HCC)    Thyroid  cancer [facility])     Past Surgical History:  Procedure Laterality Date   ABDOMINAL HYSTERECTOMY  2001   BREAST BIOPSY Right 04/16/2020   ADENOSIS   BREAST LUMPECTOMY  1996   right    COLONOSCOPY N/A 05/16/2016   Dr. Harvey: Redundant colon, hemorrhoids, next colonoscopy 10 years   INGUINAL HERNIA REPAIR N/A 08/12/2019   Procedure: laparoscopic incisional hernia repair;  Surgeon: Carolee Sherwood JONETTA DOUGLAS, MD;  Location: [facility];  Service: Urology;  Laterality: N/A;   ROBOTIC ASSITED PARTIAL NEPHRECTOMY Right 08/05/2019   Procedure: XI ROBOTIC ASSITED PARTIAL NEPHRECTOMY WITH INTRAOPERATIVE ULTRASOUND;  Surgeon: Alvaro Hummer, MD;  Location: [facility];  Service: Urology;  Laterality: Right;  3 HRS   THYROIDECTOMY Bilateral 11/17/2012   Procedure: TOTAL THYROIDECTOMY;  Surgeon: Ana LELON Moccasin, MD;  Location: [facility] OR;  Service: ENT;  Laterality: Bilateral;   TOTAL THYROIDECTOMY Bilateral 11/17/2012   TUBAL LIGATION      Current Medications: Current Meds  Medication Sig   acetaminophen  (  TYLENOL ) 500 MG tablet Take 1 tablet (500 mg total) by mouth 2 (two) times daily.   cinacalcet (SENSIPAR) 30 MG tablet Take 30 mg by mouth daily.   diltiazem  (CARDIZEM  CD) 180 MG 24 hr capsule TAKE ONE CAPSULE BY MOUTH EVERY DAY   ELIQUIS  5 MG TABS tablet TAKE ONE TABLET BY MOUTH TWICE DAILY   ezetimibe  (ZETIA ) 10 MG tablet TAKE ONE TABLET BY MOUTH EVERY DAY   FARXIGA 5 MG TABS tablet Take 5 mg by mouth every morning.   FLUoxetine  (PROZAC ) 10 MG capsule Take 1 capsule (10 mg total) by mouth daily.   fluticasone   (FLONASE ) 50 MCG/ACT nasal spray Place 2 sprays into both nostrils daily.   hydrALAZINE (APRESOLINE) 50 MG tablet Take 1 tablet by mouth twice daily   LAMICTAL  200 MG tablet Take 1 tablet (200 mg total) by mouth 2 (two) times daily.   levETIRAcetam  (KEPPRA  XR) 750 MG 24 hr tablet Take 2 tablets twice a day   levothyroxine  (SYNTHROID ) 125 MCG tablet Take 1 tablet (125 mcg total) by mouth daily.   montelukast  (SINGULAIR ) 10 MG tablet TAKE ONE TABLET BY MOUTH AT BEDTIME   olmesartan  (BENICAR ) 40 MG tablet Take 1 tablet (40 mg total) by mouth daily.   omeprazole  (PRILOSEC) 20 MG capsule TAKE ONE TABLET BY MOUTH EVERY DAY   potassium chloride  (KLOR-CON ) 10 MEQ tablet Take 10 mEq by mouth daily.   rosuvastatin  (CRESTOR ) 40 MG tablet Take 1 tablet (40 mg total) by mouth daily.   UNABLE TO FIND HHS Bath chair Bath mat     Allergies:   Simvastatin    Social History   Socioeconomic History   Marital status: Single    Spouse name: Not on file   Number of children: 2   Years of education: Not on file   Highest education level: Not on file  Occupational History   Occupation: disabled   Tobacco Use   Smoking status: Never   Smokeless tobacco: Never  Vaping Use   Vaping status: Never Used  Substance and Sexual Activity   Alcohol use: No   Drug use: No   Sexual activity: Never  Other Topics Concern   Not on file  Social History Narrative   Right handed   Caffeine intake yes   Retired   Lives alone   Social Drivers of Health   Financial Resource Strain: Low Risk  (04/14/2023)   Overall Financial Resource Strain (CARDIA)    Difficulty of Paying Living Expenses: Not hard at all  Food Insecurity: No Food Insecurity (04/14/2023)   Hunger Vital Sign    Worried About Running Out of Food in the Last Year: Never true    Ran Out of Food in the Last Year: Never true  Transportation Needs: No Transportation Needs (04/14/2023)   PRAPARE - Administrator, Civil Service (Medical): No     Lack of Transportation (Non-Medical): No  Physical Activity: Insufficiently Active (04/14/2023)   Exercise Vital Sign    Days of Exercise per Week: 3 days    Minutes of Exercise per Session: 20 min  Stress: No Stress Concern Present (04/14/2023)   [employer] of Occupational Health - Occupational Stress Questionnaire    Feeling of Stress : Not at all  Social Connections: Socially Isolated (04/14/2023)   Social Connection and Isolation Panel    Frequency of Communication with Friends and Family: Once a week    Frequency of Social Gatherings with Friends and Family: Once a week  Attends Religious Services: More than 4 times per year    Active Member of Clubs or Organizations: No    Attends Banker Meetings: Never    Marital Status: Never married     Family History: The patient's family history includes Diabetes in her mother. There is no history of Colon cancer.  ROS:   Please see the history of present illness.     All other systems reviewed and are negative.  EKGs/Labs/Other Studies Reviewed:    The following studies were reviewed today:   EKG:   EKG Interpretation Date/Time:  Tuesday October 06 2023 09:49:35 EDT Ventricular Rate:  72 PR Interval:  164 QRS Duration:  72 QT Interval:  394 QTC Calculation: 431 R Axis:   -3  Text Interpretation: Normal sinus rhythm Normal ECG When compared with ECG of 03-Aug-2022 05:12, PREVIOUS ECG IS PRESENT Confirmed by Miriam Norris [phone]) on 10/06/2023 10:09:41 AM    Echocardiogram on August 15, 2019:  1. Hyperdynamic LV systolic function; elevated mean gradient across  aortic valve (19 mmHg) suggests mild AS; however hyperdynamic LV function  at least partially contributing; trace AI; mild TR with severely elevated  pulmonary pressure.   2. Left ventricular ejection fraction, by estimation, is >75%. The left  ventricle has hyperdynamic function. The left ventricle has no regional  wall motion abnormalities.  Left ventricular diastolic parameters were  normal.   3. Right ventricular systolic function is normal. The right ventricular  size is normal. There is severely elevated pulmonary artery systolic  pressure.   4. The mitral valve is normal in structure. Trivial mitral valve  regurgitation. No evidence of mitral stenosis.   5. The aortic valve is tricuspid. Aortic valve regurgitation is trivial.  Mild aortic valve stenosis.   6. The inferior vena cava is normal in size with greater than 50%  respiratory variability, suggesting right atrial pressure of 3 mmHg.   Recent Labs: 09/03/2023: ALT 14; BUN 49; Creatinine, Ser 2.66; Hemoglobin 12.0; Magnesium 1.9; Platelets 226; Potassium 4.5; Sodium 141 10/02/2023: TSH 10.600  Recent Lipid Panel    Component Value Date/Time   CHOL 160 09/03/2023 1031   TRIG 65 09/03/2023 1031   HDL 55 09/03/2023 1031   CHOLHDL 2.9 09/03/2023 1031   CHOLHDL 3.3 10/06/2019 0951   VLDL 14 01/29/2016 1109   LDLCALC 92 09/03/2023 1031   LDLCALC 80 10/06/2019 0951     Risk Assessment/Calculations:      The 10-year ASCVD risk score (Arnett DK, et al., 2019) is: 8.5%   Values used to calculate the score:     Age: 62 years     Clincally relevant sex: Female     Is Non-Hispanic African American: Yes     Diabetic: No     Tobacco smoker: No     Systolic Blood Pressure: 122 mmHg     Is BP treated: Yes     HDL Cholesterol: 55 mg/dL     Total Cholesterol: 160 mg/dL   Physical Exam:    VS:  BP 122/70   Pulse 64   Ht 5' 5 (1.651 m)   Wt 232 lb 6.4 oz (105.4 kg)   SpO2 98%   BMI 38.67 kg/m     Wt Readings from Last 3 Encounters:  10/06/23 232 lb 6.4 oz (105.4 kg)  10/05/23 232 lb 9.6 oz (105.5 kg)  09/18/23 236 lb (107 kg)     GEN: Obese, 68 y.o. female in no acute distress HEENT: Normal NECK:  No JVD; No carotid bruits CARDIAC: S1/S2, RRR, Grade 1 murmur, no rubs, no gallops; 2+ pulses RESPIRATORY:  Clear to auscultation without rales, wheezing or  rhonchi  MUSCULOSKELETAL:  No edema; No deformity  SKIN: Warm and dry NEUROLOGIC:  Alert and oriented x 3 PSYCHIATRIC:  Normal affect   ASSESSMENT & PLAN:    In order of problems listed above:  PAF, murmur Denies any tachycardia or palpitations.  EKG today shows normal sinus rhythm.  Heart rate is well-controlled. Continue diltiazem  and Eliquis .  [name] is on appropriate dosage of Eliquis , and denies any bleeding issues. Heart healthy diet and regular cardiovascular exercise encouraged.  Grade 1/6 murmur noted on exam.  [name] is asymptomatic with this.  Will update echocardiogram at this time.  HTN Blood pressure at goal.  Denies any symptoms.  Continue current medication regimen. Heart healthy diet and regular cardiovascular exercise encouraged. Continue to follow with PCP.  HLD Recent lipid panel was unremarkable.  Continue Zetia  and rosuvastatin . Heart healthy diet and regular cardiovascular exercise encouraged.   Obesity  Weight loss via diet and exercise encouraged. Discussed the impact being overweight would have on cardiovascular risk. Heart healthy diet and regular cardiovascular exercise encouraged.   5. CKD stage IV Recent serum creatinine was 2.66, eGFR 19.  Avoid nephrotoxic agents.  Continue to follow-up with Dr. Rachele and PCP.  I spent a total duration of 30 minutes reviewing prior notes, reviewing outside records including  labs, EKG today, face-to-face counseling of medical condition, pathophysiology, evaluation, management, and documenting the findings in the note.   6.  Disposition: Follow-up with Dr. Dorn Ross or APP in 1 year or sooner if anything changes.  Medication Adjustments/Labs and Tests Ordered: Current medicines are reviewed at length with the patient today.  Concerns regarding medicines are outlined above.  Orders Placed This Encounter  Procedures   EKG 12-Lead   ECHOCARDIOGRAM COMPLETE   No orders of the defined types were placed in this  encounter.   Patient Instructions  Medication Instructions:  Your physician recommends that you continue on your current medications as directed. Please refer to the Current Medication list given to you today.  Labwork: None   Testing/Procedures: Your physician has requested that you have an echocardiogram. Echocardiography is a painless test that uses sound waves to create images of your heart. It provides your doctor with information about the size and shape of your heart and how well your heart's chambers and valves are working. This procedure takes approximately one hour. There are no restrictions for this procedure. Please do NOT wear cologne, perfume, aftershave, or lotions (deodorant is allowed). Please arrive 15 minutes prior to your appointment time.  Please note: We ask at that you not bring children with you during ultrasound (echo/ vascular) testing. Due to room size and safety concerns, children are not allowed in the ultrasound rooms during exams. Our front office staff cannot provide observation of children in our lobby area while testing is being conducted. An adult accompanying a patient to their appointment will only be allowed in the ultrasound room at the discretion of the ultrasound technician under special circumstances. We apologize for any inconvenience.  Follow-Up: Your physician recommends that you schedule a follow-up appointment in: 1 Year   Any Other Special Instructions Will Be Listed Below (If Applicable).  If you need a refill on your cardiac medications before your next appointment, please call your pharmacy.   Signed, Almarie Crate, NP

## 2023-10-06 NOTE — Patient Instructions (Addendum)
 Medication Instructions:  Your physician recommends that you continue on your current medications as directed. Please refer to the Current Medication list given to you today.  Labwork: None   Testing/Procedures: Your physician has requested that you have an echocardiogram. Echocardiography is a painless test that uses sound waves to create images of your heart. It provides your doctor with information about the size and shape of your heart and how well your heart's chambers and valves are working. This procedure takes approximately one hour. There are no restrictions for this procedure. Please do NOT wear cologne, perfume, aftershave, or lotions (deodorant is allowed). Please arrive 15 minutes prior to your appointment time.  Please note: We ask at that you not bring children with you during ultrasound (echo/ vascular) testing. Due to room size and safety concerns, children are not allowed in the ultrasound rooms during exams. Our front office staff cannot provide observation of children in our lobby area while testing is being conducted. An adult accompanying a patient to their appointment will only be allowed in the ultrasound room at the discretion of the ultrasound technician under special circumstances. We apologize for any inconvenience.  Follow-Up: Your physician recommends that you schedule a follow-up appointment in: 1 Year   Any Other Special Instructions Will Be Listed Below (If Applicable).  If you need a refill on your cardiac medications before your next appointment, please call your pharmacy.

## 2023-10-20 ENCOUNTER — Encounter: Payer: Self-pay | Admitting: Gastroenterology

## 2023-10-20 ENCOUNTER — Ambulatory Visit (INDEPENDENT_AMBULATORY_CARE_PROVIDER_SITE_OTHER): Admitting: Gastroenterology

## 2023-10-20 VITALS — BP 133/71 | HR 100 | Temp 98.1°F | Ht 65.0 in | Wt 233.6 lb

## 2023-10-20 DIAGNOSIS — R748 Abnormal levels of other serum enzymes: Secondary | ICD-10-CM | POA: Diagnosis not present

## 2023-10-20 NOTE — Patient Instructions (Signed)
 Please go to [facility] to complete your labs.   Return to the office in 3-4 weeks for results.

## 2023-10-20 NOTE — Progress Notes (Signed)
 GI Office Note    Referring Provider: Antonetta Rollene BRAVO, MD Primary Care Physician:  Antonetta Rollene BRAVO, MD  Primary Gastroenterologist: Carlin POUR. Cindie, DO   Chief Complaint   Chief Complaint  Patient presents with   elevated alk phos     History of Present Illness   [name] is a 68 y.o. female presenting today at the request of Dr. Antonetta for further evaluation of elevated alkaline phosphatase. She was last seen in 06/2020.    Discussed the use of AI scribe software for clinical note transcription with the patient, who gave verbal consent to proceed.    She has had elevated alkaline phosphatase levels consistently since at least 2023. Previous imaging, including a CT A/P without contrast at the end of last year, with unremarkable noncontrast liver. She has had extensive as outlined below as well. MRI Abd with and without contrast in 2021 with unremarkable liver.   No abdominal pain, changes in bowel habits, blood in stool, nausea, or vomiting. Appetite is good. Heartburn is well-controlled with omeprazole . She has been actively trying to lose weight and has lost about 15 pounds over the last year by changing her diet to include more boiled foods instead of fried foods.  She reports right leg pain which she contributes to arthritis.          Prior Data   Last colonoscopy March 2018, redundant colon, hemorrhoids but otherwise unremarkable.  Follow-up colonoscopy recommended in 10 years.   History of transaminitis: June 2021 AST 121/ALT 194.  August 2021: AST 185/ALT 139.  January 2022: AST 15/ALT 9.  Hepatitis B surface antigen nonreactive.  Hepatitis C antibody nonreactive.   Right upper quadrant ultrasound August 2021: IMPRESSION: 1. Mildly heterogeneous appearance of the liver, nonspecific but can be seen in hepatocellular disease state such as hepatitis. No focal liver lesion is identified. 2. Nonvascular masslike intraluminal structure within  the gallbladder lumen most suggestive of tumefactive biliary sludge. An intraluminal neoplasm is felt to be unlikely given the sonographic features as well as the appearance of the gallbladder on recent MRI and CT.  She had a total of 3 CTs and MRI abdomen since January 2021.  Gallbladder unremarkable on the studies.  Right upper quadrant ultrasound in August 2021 showed nonvascular masslike intraluminal structure within the gallbladder lumen most suggestive of tumefactive sludge.  An intraluminal neoplasm felt to be unlikely given scintigraphic features as well as appearance of the gallbladder on recent MRI and CTs.   MRI Abd with and without contrast 06/2019: IMPRESSION: 1. 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 Recommend urology consultation. 2. Normal RIGHT renal vein.  No lymphadenopathy. 3. Several bilateral Bosniak I renal cysts.   CT abdomen office without contrast October 2024: IMPRESSION: 1.  No urinary tract calculi or hydronephrosis. 2. Otherwise, low sensitivity exam secondary to stone study technique. 3.  Possible constipation. 4. 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 5. Cholelithiasis 6.  Aortic Atherosclerosis (ICD10-I70.0).   Medications   Current Outpatient Medications  Medication Sig Dispense Refill   acetaminophen  (TYLENOL ) 500 MG tablet Take 1 tablet (500 mg total) by mouth 2 (two) times daily. 60 tablet 5   cinacalcet (SENSIPAR) 30 MG tablet Take 30 mg by mouth daily.     diltiazem  (CARDIZEM  CD) 180 MG 24 hr capsule TAKE ONE CAPSULE BY MOUTH EVERY DAY 30 capsule 1   ELIQUIS  5 MG  TABS tablet TAKE ONE TABLET BY MOUTH TWICE DAILY 60 tablet 1   ezetimibe  (ZETIA ) 10 MG tablet TAKE ONE TABLET BY MOUTH EVERY DAY 30 tablet 1   FARXIGA 5 MG TABS tablet Take 5 mg by mouth every morning.     FLUoxetine  (PROZAC ) 10 MG capsule Take 1 capsule (10 mg total) by  mouth daily. 30 capsule 5   hydrALAZINE (APRESOLINE) 50 MG tablet Take 1 tablet by mouth twice daily 180 tablet 0   LAMICTAL  200 MG tablet Take 1 tablet (200 mg total) by mouth 2 (two) times daily. 180 tablet 3   levETIRAcetam  (KEPPRA  XR) 750 MG 24 hr tablet Take 2 tablets twice a day 360 tablet 3   levothyroxine  (SYNTHROID ) 125 MCG tablet Take 1 tablet (125 mcg total) by mouth daily. 90 tablet 3   montelukast  (SINGULAIR ) 10 MG tablet TAKE ONE TABLET BY MOUTH AT BEDTIME 30 tablet 1   olmesartan  (BENICAR ) 40 MG tablet Take 1 tablet (40 mg total) by mouth daily. 100 tablet 3   omeprazole  (PRILOSEC) 20 MG capsule TAKE ONE TABLET BY MOUTH EVERY DAY 30 capsule 1   rosuvastatin  (CRESTOR ) 40 MG tablet Take 1 tablet (40 mg total) by mouth daily. 100 tablet 3   UNABLE TO FIND HHS Bath chair Bath mat 1 each 0   No current facility-administered medications for this visit.    Allergies   Allergies as of 10/20/2023 - Review Complete 10/20/2023  Allergen Reaction Noted   Simvastatin  Other (See Comments) 05/23/2015    Past Medical History   Past Medical History:  Diagnosis Date   A-fib (HCC)    Anemia    Depression    Dyspnea    Fractures    Hyperlipidemia    Hypertension    Mental retardation, mild (I.Q. 50-70)    lives alone and cares for self has a nurse aide comes once a day    Obesity    Pneumonia    PONV (postoperative nausea and vomiting)    Seizure (HCC)    11/17/2012 still having them; I believe their from a tumor in my head ((11/17/2012   Seizure disorder (HCC)    Thyroid  cancer [facility])     Past Surgical History   Past Surgical History:  Procedure Laterality Date   ABDOMINAL HYSTERECTOMY  2001   BREAST BIOPSY Right 04/16/2020   ADENOSIS   BREAST LUMPECTOMY  1996   right    COLONOSCOPY N/A 05/16/2016   Dr. Harvey: Redundant colon, hemorrhoids, next colonoscopy 10 years   INGUINAL HERNIA REPAIR N/A 08/12/2019   Procedure: laparoscopic incisional hernia repair;   Surgeon: Carolee Sherwood JONETTA DOUGLAS, MD;  Location: [facility];  Service: Urology;  Laterality: N/A;   ROBOTIC ASSITED PARTIAL NEPHRECTOMY Right 08/05/2019   Procedure: XI ROBOTIC ASSITED PARTIAL NEPHRECTOMY WITH INTRAOPERATIVE ULTRASOUND;  Surgeon: Alvaro Hummer, MD;  Location: [facility];  Service: Urology;  Laterality: Right;  3 HRS   THYROIDECTOMY Bilateral 11/17/2012   Procedure: TOTAL THYROIDECTOMY;  Surgeon: Ana LELON Moccasin, MD;  Location: [facility] OR;  Service: ENT;  Laterality: Bilateral;   TOTAL THYROIDECTOMY Bilateral 11/17/2012   TUBAL LIGATION      Past Family History   Family History  Problem Relation Age of Onset   Diabetes Mother    Colon cancer Neg Hx     Past Social History   Social History   Socioeconomic History   Marital status: Single    Spouse name: Not on file   Number of children:  2   Years of education: Not on file   Highest education level: Not on file  Occupational History   Occupation: disabled   Tobacco Use   Smoking status: Never   Smokeless tobacco: Never  Vaping Use   Vaping status: Never Used  Substance and Sexual Activity   Alcohol use: No   Drug use: No   Sexual activity: Never  Other Topics Concern   Not on file  Social History Narrative   Right handed   Caffeine intake yes   Retired   Lives alone   Social Drivers of Health   Financial Resource Strain: Low Risk  (04/14/2023)   Overall Financial Resource Strain (CARDIA)    Difficulty of Paying Living Expenses: Not hard at all  Food Insecurity: No Food Insecurity (04/14/2023)   Hunger Vital Sign    Worried About Running Out of Food in the Last Year: Never true    Ran Out of Food in the Last Year: Never true  Transportation Needs: No Transportation Needs (04/14/2023)   PRAPARE - Administrator, Civil Service (Medical): No    Lack of Transportation (Non-Medical): No  Physical Activity: Insufficiently Active (04/14/2023)   Exercise Vital Sign    Days of Exercise per Week: 3 days    Minutes  of Exercise per Session: 20 min  Stress: No Stress Concern Present (04/14/2023)   [employer] of Occupational Health - Occupational Stress Questionnaire    Feeling of Stress : Not at all  Social Connections: Socially Isolated (04/14/2023)   Social Connection and Isolation Panel    Frequency of Communication with Friends and Family: Once a week    Frequency of Social Gatherings with Friends and Family: Once a week    Attends Religious Services: More than 4 times per year    Active Member of [employer] or Organizations: No    Attends Banker Meetings: Never    Marital Status: Never married  Intimate Partner Violence: Not At Risk (04/14/2023)   Humiliation, Afraid, Rape, and Kick questionnaire    Fear of Current or Ex-Partner: No    Emotionally Abused: No    Physically Abused: No    Sexually Abused: No    Review of Systems   General: Negative for anorexia, weight loss, fever, chills, fatigue, weakness. Eyes: Negative for vision changes.  ENT: Negative for hoarseness, difficulty swallowing , nasal congestion. CV: Negative for chest pain, angina, palpitations, dyspnea on exertion, peripheral edema.  Respiratory: Negative for dyspnea at rest, dyspnea on exertion, cough, sputum, wheezing.  GI: See history of present illness. GU:  Negative for dysuria, hematuria, urinary incontinence, urinary frequency, nocturnal urination.  MS: right leg pain, no low back pain.  Derm: Negative for rash or itching.  Neuro: Negative for weakness, abnormal sensation, seizure, frequent headaches, memory loss,  confusion.  Psych: Negative for anxiety, depression, suicidal ideation, hallucinations.  Endo: Negative for unusual weight change.  Heme: Negative for bruising or bleeding. Allergy: Negative for rash or hives.  Physical Exam   BP 133/71 (BP Location: Right Arm, Patient Position: Sitting, Cuff Size: Large)   Pulse 100   Temp 98.1 F (36.7 C) (Oral)   Ht 5' 5 (1.651 m)   Wt 233 lb  9.6 oz (106 kg)   SpO2 98%   BMI 38.87 kg/m    General: Well-nourished, well-developed in no acute distress.  Head: Normocephalic, atraumatic.   Eyes: Conjunctiva pink, no icterus. Mouth: Oropharyngeal mucosa moist and pink  Neck: Supple  without thyromegaly, masses, or lymphadenopathy.  Lungs: Clear to auscultation bilaterally.  Heart: Regular rate and rhythm, no murmurs rubs or gallops.  Abdomen: Bowel sounds are normal, nontender, nondistended, no hepatosplenomegaly or masses,  no abdominal bruits or hernia, no rebound or guarding.   Rectal: not performed Extremities: trace bilateral lower extremity edema. No clubbing or deformities.  Neuro: Alert and oriented x 4 , grossly normal neurologically.  Skin: Warm and dry, no rash or jaundice.   Psych: Alert and cooperative, normal mood and affect.  Labs     Lab Results  Component Value Date   WBC 5.7 09/03/2023   HGB 12.0 09/03/2023   HCT 38.1 09/03/2023   MCV 89 09/03/2023   PLT 226 09/03/2023   Lab Results  Component Value Date   NA 141 09/03/2023   CL 105 09/03/2023   K 4.5 09/03/2023   CO2 16 (L) 09/03/2023   BUN 49 (H) 09/03/2023   CREATININE 2.66 (H) 09/03/2023   EGFR 19 (L) 09/03/2023   CALCIUM  8.9 09/03/2023   PHOS 3.2 08/26/2019   ALBUMIN  4.3 09/03/2023   GLUCOSE 145 (H) 09/03/2023   Lab Results  Component Value Date   ALT 14 09/03/2023   AST 16 09/03/2023   ALKPHOS 184 (H) 09/03/2023   BILITOT 0.4 09/03/2023   Component     Latest Ref Rng 08/12/2021 08/27/2021 03/17/2022 06/05/2022 08/03/2022 09/02/2022  Total Bilirubin     0.0 - 1.2 mg/dL 0.6   0.4  0.5  0.5  0.5   AST     0 - 40 IU/L 23   18  25  28  18    ALT     0 - 32 IU/L 18   17  16  23  14    Albumin      3.9 - 4.9 g/dL 4.0   4.3   3.8  4.3   Alkaline Phosphatase     44 - 121 IU/L 151 (H)   196 (H)   127 (H)  164 (H)    Component     Latest Ref Rng 12/16/2022 09/03/2023  Total Bilirubin     0.0 - 1.2 mg/dL 0.4  0.4   AST     0 - 40 IU/L 25  16    ALT     0 - 32 IU/L 23  14   Albumin      3.9 - 4.9 g/dL 4.0  4.3   Alkaline Phosphatase     44 - 121 IU/L 160 (H)  184 (H)          Imaging Studies   No results found.  Assessment/Plan:        Elevated alkaline phosphatase of unclear etiology Chronic elevation of alkaline phosphatase since 2023 with otherwise normal liver function tests. Differential diagnosis includes liver pathology, bone disorders, or intestinal issues. Previous imaging, as outlined above, both recent noncontrast CT, and remote MRI with and without contrast with unremarkable liver.    -LFTs, iron/tibc/ferritin, AP isoenzymes, PBC profile -may require updated imaging depending on lab results. She is not able to have contrast due to renal disease -patient to return for results in 3-4 weeks.   Sonny RAMAN. Ezzard, MHS, PA-C [facility]

## 2023-10-25 ENCOUNTER — Other Ambulatory Visit: Payer: Self-pay | Admitting: Family Medicine

## 2023-11-25 ENCOUNTER — Ambulatory Visit: Admitting: Gastroenterology

## 2023-11-25 ENCOUNTER — Encounter: Payer: Self-pay | Admitting: Gastroenterology

## 2023-11-25 ENCOUNTER — Ambulatory Visit: Payer: Self-pay | Admitting: Gastroenterology

## 2023-11-25 VITALS — BP 104/69 | HR 93 | Temp 98.5°F | Ht 65.0 in | Wt 229.2 lb

## 2023-11-25 DIAGNOSIS — R748 Abnormal levels of other serum enzymes: Secondary | ICD-10-CM

## 2023-11-25 NOTE — Progress Notes (Signed)
 GI Office Note    Referring Provider: Antonetta Rollene BRAVO, MD Primary Care Physician:  Antonetta Rollene BRAVO, MD  Primary Gastroenterologist: Carlin POUR. Cindie, DO   Chief Complaint   Chief Complaint  Patient presents with   Follow-up    Doing well, no issues    History of Present Illness   [name] is a 68 y.o. female presenting today for follow up. Last seen in 09/2023. History of elevated alkaline phosphatase. Patient presents to discuss results of labs.  She has had elevated alkaline phosphatase levels consistently since at least 2023. Previous imaging, including a CT A/P without contrast at the end of last year, with unremarkable noncontrast liver. She has had extensive work up as outlined below as well. MRI Abd with and without contrast in 2021 with unremarkable liver.   History of transaminitis: June 2021 AST 121/ALT 194.  August 2021: AST 185/ALT 139.  January 2022: AST 15/ALT 9.  Hepatitis B surface antigen nonreactive.  Hepatitis C antibody nonreactive. But AST/ALT have been normal since then. Etiology was not clear and on recheck her LFTs returned to normal on 02/2020.   Discussed the use of AI scribe software for clinical note transcription with the patient, who gave verbal consent to proceed.   No abdominal pain, bowel issues, nausea, vomiting, or itching. She has a good appetite and normal urination with light-colored urine. She has no concerns. She had her labs done two days ago and unfortunately the PBC and AP isoenzymes are pending.     Prior Data   11/23/23: iron sat 22, iron 57, tibc 255, ferritin 226, GGT 21, Tbili 0.4, AP 185H, AST 19, ALT 14.   Right upper quadrant ultrasound August 2021: IMPRESSION: 1. Mildly heterogeneous appearance of the liver, nonspecific but can be seen in hepatocellular disease state such as hepatitis. No focal liver lesion is identified. 2. Nonvascular masslike intraluminal structure within the gallbladder lumen most  suggestive of tumefactive biliary sludge. An intraluminal neoplasm is felt to be unlikely given the sonographic features as well as the appearance of the gallbladder on recent MRI and CT.   She had a total of 3 CTs and MRI abdomen since January 2021.  Gallbladder unremarkable on the studies.  Right upper quadrant ultrasound in August 2021 showed nonvascular masslike intraluminal structure within the gallbladder lumen most suggestive of tumefactive sludge.  An intraluminal neoplasm felt to be unlikely given scintigraphic features as well as appearance of the gallbladder on recent MRI and CTs.    MRI Abd with and without contrast 06/2019: IMPRESSION: 1. 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 Recommend urology consultation. 2. Normal RIGHT renal vein.  No lymphadenopathy. 3. Several bilateral Bosniak I renal cysts.   CT abdomen office without contrast October 2024: IMPRESSION: 1.  No urinary tract calculi or hydronephrosis. 2. Otherwise, low sensitivity exam secondary to stone study technique. 3.  Possible constipation. 4. 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 5. Cholelithiasis 6.  Aortic Atherosclerosis (ICD10-I70.0).  Last colonoscopy March 2018, redundant colon, hemorrhoids but otherwise unremarkable.  Follow-up colonoscopy recommended in 10 years.  Medications   Current Outpatient Medications  Medication Sig Dispense Refill   acetaminophen  (TYLENOL ) 500 MG tablet Take 1 tablet (500 mg total) by mouth 2 (two) times daily. 60 tablet 5   cinacalcet (SENSIPAR) 30 MG tablet Take 30 mg by mouth daily.     diltiazem  (CARDIZEM  CD) 180  MG 24 hr capsule TAKE ONE CAPSULE BY MOUTH EVERY DAY 30 capsule 1   ELIQUIS  5 MG TABS tablet TAKE ONE TABLET BY MOUTH TWICE DAILY 60 tablet 1   ezetimibe  (ZETIA ) 10 MG tablet TAKE ONE TABLET BY MOUTH EVERY DAY 30 tablet 1   FARXIGA 5 MG TABS tablet  Take 5 mg by mouth every morning.     FLUoxetine  (PROZAC ) 10 MG capsule Take 1 capsule (10 mg total) by mouth daily. 30 capsule 5   hydrALAZINE (APRESOLINE) 50 MG tablet Take 1 tablet by mouth twice daily 180 tablet 0   LAMICTAL  200 MG tablet Take 1 tablet (200 mg total) by mouth 2 (two) times daily. 180 tablet 3   levETIRAcetam  (KEPPRA  XR) 750 MG 24 hr tablet Take 2 tablets twice a day 360 tablet 3   levothyroxine  (SYNTHROID ) 125 MCG tablet Take 1 tablet (125 mcg total) by mouth daily. 90 tablet 3   montelukast  (SINGULAIR ) 10 MG tablet TAKE ONE TABLET BY MOUTH AT BEDTIME 30 tablet 1   olmesartan  (BENICAR ) 40 MG tablet Take 1 tablet (40 mg total) by mouth daily. 100 tablet 3   omeprazole  (PRILOSEC) 20 MG capsule TAKE ONE TABLET BY MOUTH EVERY DAY 30 capsule 1   rosuvastatin  (CRESTOR ) 40 MG tablet Take 1 tablet (40 mg total) by mouth daily. 100 tablet 3   UNABLE TO FIND HHS Bath chair Bath mat 1 each 0   No current facility-administered medications for this visit.    Allergies   Allergies as of 11/25/2023 - Review Complete 11/25/2023  Allergen Reaction Noted   Simvastatin  Other (See Comments) 05/23/2015     Review of Systems   General: Negative for anorexia, weight loss, fever, chills, fatigue, weakness. ENT: Negative for hoarseness, difficulty swallowing , nasal congestion. CV: Negative for chest pain, angina, palpitations, dyspnea on exertion, peripheral edema.  Respiratory: Negative for dyspnea at rest, dyspnea on exertion, cough, sputum, wheezing.  GI: See history of present illness. GU:  Negative for dysuria, hematuria, urinary incontinence, urinary frequency, nocturnal urination.  Endo: Negative for unusual weight change.     Physical Exam   BP 104/69 (BP Location: Right Arm, Patient Position: Sitting, Cuff Size: Large)   Pulse 93   Temp 98.5 F (36.9 C) (Oral)   Ht 5' 5 (1.651 m)   Wt 229 lb 3.2 oz (104 kg)   SpO2 96%   BMI 38.14 kg/m    General: Well-nourished,  well-developed in no acute distress.  Eyes: No icterus. Mouth: Oropharyngeal mucosa moist and pink   Lungs: Clear to auscultation bilaterally.  Heart: Regular rate and rhythm, no murmurs rubs or gallops.  Abdomen: Bowel sounds are normal, nontender, nondistended, no hepatosplenomegaly or masses,  no abdominal bruits or hernia , no rebound or guarding.  Rectal: not performed Extremities: No lower extremity edema. No clubbing or deformities. Neuro: Alert and oriented x 4   Skin: Warm and dry, no jaundice.   Psych: Alert and cooperative, normal mood and affect.  Labs   Lab Results  Component Value Date   NA 141 09/03/2023   CL 105 09/03/2023   K 4.5 09/03/2023   CO2 16 (L) 09/03/2023   BUN 49 (H) 09/03/2023   CREATININE 2.66 (H) 09/03/2023   EGFR 19 (L) 09/03/2023   CALCIUM  8.9 09/03/2023   PHOS 3.2 08/26/2019   ALBUMIN  4.3 11/23/2023   GLUCOSE 145 (H) 09/03/2023   Lab Results  Component Value Date   ALT 14 11/23/2023   AST  19 11/23/2023   GGT 21 11/23/2023   ALKPHOS 185 (H) 11/23/2023   BILITOT 0.4 11/23/2023   Lab Results  Component Value Date   WBC 5.7 09/03/2023   HGB 12.0 09/03/2023   HCT 38.1 09/03/2023   MCV 89 09/03/2023   PLT 226 09/03/2023   Lab Results  Component Value Date   IRON 57 11/23/2023   TIBC 255 11/23/2023   FERRITIN 226 (H) 11/23/2023    Imaging Studies   No results found.  Assessment/Plan:    Elevated alkaline phosphatase   Chronic elevation since 2022 with normal AST/ALT, liver imaging previously. Elevated alk phos could be due to source other than liver given normal GGT. Await isoenzymes and PBC profile. Patient remains asymptomatic from a GI/liver standpoint. -follow up pending labs -further recommendations to follow when results are available -she may require updated liver imaging depending on results    Sonny RAMAN. Ezzard, MHS, PA-C [facility]

## 2023-11-25 NOTE — Patient Instructions (Addendum)
 We will reach out when your lab results are back.   Please call if you have any questions or concerns.

## 2023-12-04 LAB — PRIMARY BILIARY CHOLANGITIS PR
Anti-GP-210 Ab (RDL): <20 U (ref <20)
Anti-Mitochondrial Ab by IFA: <1:20 {titer}
Anti-Mitochondrial M2 Ab (RDL): <20 U (ref <20)
Anti-Nuclear Ab by IFA (RDL): NEGATIVE
Anti-SP-100 Ab (RDL): <20 U (ref <20)
Anti-Smooth Muscle Ab by IFA: 1:80 {titer} — AB

## 2023-12-04 LAB — HEPATIC FUNCTION PANEL
ALT: 14 IU/L (ref 0–32)
AST: 19 IU/L (ref 0–40)
Albumin: 4.3 g/dL (ref 3.9–4.9)
Alkaline Phosphatase: 185 IU/L — ABNORMAL HIGH (ref 49–135)
Bilirubin Total: 0.4 mg/dL (ref 0.0–1.2)
Bilirubin, Direct: 0.15 mg/dL (ref 0.00–0.40)
Total Protein: 7.3 g/dL (ref 6.0–8.5)

## 2023-12-04 LAB — IRON,TIBC AND FERRITIN PANEL
Ferritin: 226 ng/mL — ABNORMAL HIGH (ref 15–150)
Iron Saturation: 22 % (ref 15–55)
Iron: 57 ug/dL (ref 27–139)
Total Iron Binding Capacity: 255 ug/dL (ref 250–450)
UIBC: 198 ug/dL (ref 118–369)

## 2023-12-04 LAB — GAMMA GT: GGT: 21 IU/L (ref 0–60)

## 2023-12-04 LAB — ALKALINE PHOSPHATASE, ISOENZYMES
BONE FRACTION: 29 % (ref 14–68)
INTESTINAL FRAC.: 1 % (ref 0–18)
LIVER FRACTION: 70 % (ref 18–85)

## 2023-12-07 ENCOUNTER — Ambulatory Visit: Attending: Nurse Practitioner

## 2023-12-07 DIAGNOSIS — R011 Cardiac murmur, unspecified: Secondary | ICD-10-CM | POA: Diagnosis present

## 2023-12-07 LAB — ECHOCARDIOGRAM COMPLETE
AR max vel: 1.52 cm2
AV Area VTI: 1.26 cm2
AV Area mean vel: 1.33 cm2
AV Mean grad: 9.5 mmHg
AV Peak grad: 17.9 mmHg
Ao pk vel: 2.12 m/s
Area-P 1/2: 3.16 cm2
Calc EF: 69.3 %
MV VTI: 2.53 cm2
S' Lateral: 2.5 cm
Single Plane A2C EF: 68.2 %
Single Plane A4C EF: 68.8 %

## 2023-12-09 ENCOUNTER — Ambulatory Visit: Payer: Self-pay

## 2023-12-09 DIAGNOSIS — Z23 Encounter for immunization: Secondary | ICD-10-CM | POA: Diagnosis not present

## 2023-12-09 NOTE — Progress Notes (Signed)
 Patient is in office today for a nurse visit for Immunization. Patient Injection was given in the  Left deltoid. Patient tolerated injection well.

## 2023-12-27 ENCOUNTER — Other Ambulatory Visit: Payer: Self-pay | Admitting: Family Medicine

## 2023-12-28 ENCOUNTER — Other Ambulatory Visit: Payer: Self-pay | Admitting: *Deleted

## 2023-12-28 DIAGNOSIS — R748 Abnormal levels of other serum enzymes: Secondary | ICD-10-CM

## 2024-01-05 ENCOUNTER — Ambulatory Visit (HOSPITAL_COMMUNITY)
Admission: RE | Admit: 2024-01-05 | Discharge: 2024-01-05 | Disposition: A | Discharge status: Home / Self Care | Source: Ambulatory Visit | Attending: Gastroenterology | Admitting: Gastroenterology

## 2024-01-05 ENCOUNTER — Other Ambulatory Visit: Payer: Self-pay | Admitting: Gastroenterology

## 2024-01-05 DIAGNOSIS — R748 Abnormal levels of other serum enzymes: Secondary | ICD-10-CM | POA: Diagnosis present

## 2024-01-05 MED ORDER — GADOBUTROL 1 MMOL/ML IV SOLN
10.0000 mL | Freq: Once | INTRAVENOUS | Status: AC | PRN
  Administered 2024-01-05: 10 mL via INTRAVENOUS

## 2024-01-07 ENCOUNTER — Encounter: Payer: Self-pay | Admitting: Neurology

## 2024-01-11 ENCOUNTER — Ambulatory Visit: Admitting: Family Medicine

## 2024-01-11 ENCOUNTER — Ambulatory Visit: Payer: Self-pay | Admitting: Gastroenterology

## 2024-01-18 ENCOUNTER — Ambulatory Visit: Payer: Self-pay | Admitting: Family Medicine

## 2024-01-18 ENCOUNTER — Ambulatory Visit (HOSPITAL_COMMUNITY)
Admission: RE | Admit: 2024-01-18 | Discharge: 2024-01-18 | Disposition: A | Discharge status: Home / Self Care | Source: Ambulatory Visit | Attending: Family Medicine | Admitting: Family Medicine

## 2024-01-18 DIAGNOSIS — Z1231 Encounter for screening mammogram for malignant neoplasm of breast: Secondary | ICD-10-CM | POA: Insufficient documentation

## 2024-01-18 DIAGNOSIS — Z0000 Encounter for general adult medical examination without abnormal findings: Secondary | ICD-10-CM

## 2024-01-18 DIAGNOSIS — Z780 Asymptomatic menopausal state: Secondary | ICD-10-CM

## 2024-01-26 LAB — TSH: TSH: 7.75 u[IU]/mL — ABNORMAL HIGH (ref 0.450–4.500)

## 2024-01-26 LAB — T4, FREE: Free T4: 1.39 ng/dL (ref 0.82–1.77)

## 2024-02-03 ENCOUNTER — Ambulatory Visit: Admitting: Family Medicine

## 2024-02-04 ENCOUNTER — Ambulatory Visit: Admitting: Nurse Practitioner

## 2024-02-04 ENCOUNTER — Encounter: Payer: Self-pay | Admitting: Nurse Practitioner

## 2024-02-04 VITALS — BP 110/78 | HR 92 | Ht 65.0 in | Wt 222.2 lb

## 2024-02-04 DIAGNOSIS — E890 Postprocedural hypothyroidism: Secondary | ICD-10-CM | POA: Diagnosis not present

## 2024-02-04 DIAGNOSIS — C73 Malignant neoplasm of thyroid gland: Secondary | ICD-10-CM | POA: Diagnosis not present

## 2024-02-04 MED ORDER — LEVOTHYROXINE SODIUM 137 MCG PO TABS: 137.0000 ug | ORAL_TABLET | Freq: Every day | ORAL | 1 refills | Status: AC

## 2024-02-04 NOTE — Patient Instructions (Signed)

## 2024-02-04 NOTE — Progress Notes (Signed)
 02/04/2024      Endocrinology follow-up note     Subjective:    Patient ID: [name], female    DOB: 12/20/1955,    Past Medical History:  Diagnosis Date   A-fib (HCC)    Anemia    Depression    Dyspnea    Fractures    Hyperlipidemia    Hypertension    Mental retardation, mild (I.Q. 50-70)    lives alone and cares for self has a nurse aide comes once a day    Obesity    Pneumonia    PONV (postoperative nausea and vomiting)    Seizure (HCC)    11/17/2012 still having them; I believe their from a tumor in my head ((11/17/2012   Seizure disorder (HCC)    Thyroid  cancer [facility])    Past Surgical History:  Procedure Laterality Date   ABDOMINAL HYSTERECTOMY  2001   BREAST BIOPSY Right 04/16/2020   ADENOSIS   BREAST LUMPECTOMY  1996   right    COLONOSCOPY N/A 05/16/2016   Dr. Harvey: Redundant colon, hemorrhoids, next colonoscopy 10 years   INGUINAL HERNIA REPAIR N/A 08/12/2019   Procedure: laparoscopic incisional hernia repair;  Surgeon: Carolee Sherwood JONETTA DOUGLAS, MD;  Location: [facility] ORS;  Service: Urology;  Laterality: N/A;   ROBOTIC ASSITED PARTIAL NEPHRECTOMY Right 08/05/2019   Procedure: XI ROBOTIC ASSITED PARTIAL NEPHRECTOMY WITH INTRAOPERATIVE ULTRASOUND;  Surgeon: Alvaro Hummer, MD;  Location: [facility];  Service: Urology;  Laterality: Right;  3 HRS   THYROIDECTOMY Bilateral 11/17/2012   Procedure: TOTAL THYROIDECTOMY;  Surgeon: Ana LELON Moccasin, MD;  Location: [facility] OR;  Service: ENT;  Laterality: Bilateral;   TOTAL THYROIDECTOMY Bilateral 11/17/2012   TUBAL LIGATION     Social History   Socioeconomic History   Marital status: Single    Spouse name: Not on file   Number of children: 2   Years of education: Not on file   Highest education level: Not on file  Occupational History   Occupation: disabled   Tobacco Use   Smoking status: Never   Smokeless tobacco: Never  Vaping Use   Vaping status: Never Used  Substance and Sexual Activity   Alcohol use: No   Drug  use: No   Sexual activity: Never  Other Topics Concern   Not on file  Social History Narrative   Right handed   Caffeine intake yes   Retired   Lives alone   Social Drivers of Health   Tobacco Use: Low Risk (02/04/2024)   Patient History    Smoking Tobacco Use: Never    Smokeless Tobacco Use: Never    Passive Exposure: Not on file  Financial Resource Strain: Low Risk (04/14/2023)   Overall Financial Resource Strain (CARDIA)    Difficulty of Paying Living Expenses: Not hard at all  Food Insecurity: No Food Insecurity (04/14/2023)   Hunger Vital Sign    Worried About Running Out of Food in the Last Year: Never true    Ran Out of Food in the Last Year: Never true  Transportation Needs: No Transportation Needs (04/14/2023)   PRAPARE - Administrator, Civil Service (Medical): No    Lack of Transportation (Non-Medical): No  Physical Activity: Insufficiently Active (04/14/2023)   Exercise Vital Sign    Days of Exercise per Week: 3 days    Minutes of Exercise per Session: 20 min  Stress: No Stress Concern Present (04/14/2023)   [employer] of Occupational Health - Occupational  Stress Questionnaire    Feeling of Stress : Not at all  Social Connections: Socially Isolated (04/14/2023)   Social Connection and Isolation Panel    Frequency of Communication with Friends and Family: Once a week    Frequency of Social Gatherings with Friends and Family: Once a week    Attends Religious Services: More than 4 times per year    Active Member of Clubs or Organizations: No    Attends Banker Meetings: Never    Marital Status: Never married  Depression (PHQ2-9): Low Risk (09/03/2023)   Depression (PHQ2-9)    PHQ-2 Score: 0  Alcohol Screen: Low Risk (04/14/2023)   Alcohol Screen    Last Alcohol Screening Score (AUDIT): 0  Housing: Low Risk (04/14/2023)   Housing Stability Vital Sign    Unable to Pay for Housing in the Last Year: No    Number of Times Moved in the Last  Year: 0    Homeless in the Last Year: No  Utilities: Not At Risk (04/14/2023)   AHC Utilities    Threatened with loss of utilities: No  Health Literacy: Adequate Health Literacy (04/14/2023)   B1300 Health Literacy    Frequency of need for help with medical instructions: Never   Outpatient Encounter Medications as of 02/04/2024  Medication Sig   acetaminophen  (TYLENOL ) 500 MG tablet Take 1 tablet (500 mg total) by mouth 2 (two) times daily.   cinacalcet (SENSIPAR) 30 MG tablet Take 30 mg by mouth daily.   diltiazem  (CARDIZEM  CD) 180 MG 24 hr capsule TAKE ONE CAPSULE BY MOUTH EVERY DAY   ELIQUIS  5 MG TABS tablet TAKE ONE TABLET BY MOUTH TWICE DAILY   ezetimibe  (ZETIA ) 10 MG tablet TAKE ONE TABLET BY MOUTH EVERY DAY   FARXIGA 5 MG TABS tablet Take 5 mg by mouth every morning.   FLUoxetine  (PROZAC ) 10 MG capsule Take 1 capsule (10 mg total) by mouth daily.   hydrALAZINE (APRESOLINE) 50 MG tablet Take 1 tablet by mouth twice daily   LAMICTAL  200 MG tablet Take 1 tablet (200 mg total) by mouth 2 (two) times daily.   levETIRAcetam  (KEPPRA  XR) 750 MG 24 hr tablet Take 2 tablets twice a day   montelukast  (SINGULAIR ) 10 MG tablet TAKE ONE TABLET BY MOUTH AT BEDTIME   olmesartan  (BENICAR ) 40 MG tablet Take 1 tablet (40 mg total) by mouth daily.   omeprazole  (PRILOSEC) 20 MG capsule TAKE ONE TABLET BY MOUTH EVERY DAY   rosuvastatin  (CRESTOR ) 40 MG tablet Take 1 tablet (40 mg total) by mouth daily.   UNABLE TO FIND HHS Bath chair Bath mat   [DISCONTINUED] levothyroxine  (SYNTHROID ) 125 MCG tablet Take 1 tablet (125 mcg total) by mouth daily.   levothyroxine  (SYNTHROID ) 137 MCG tablet Take 1 tablet (137 mcg total) by mouth daily before breakfast.   No facility-administered encounter medications on file as of 02/04/2024.   ALLERGIES: Allergies  Allergen Reactions   Simvastatin  Other (See Comments)    Muscle aches   VACCINATION STATUS: Immunization History  Administered Date(s) Administered    Fluad Quad(high Dose 65+) 11/29/2019, 11/29/2020, 10/31/2021   Fluad Trivalent(High Dose 65+) 11/05/2022   H1N1 12/16/2007   INFLUENZA, HIGH DOSE SEASONAL PF 12/09/2023   Influenza Split 11/24/2011   Influenza Whole 11/30/2006, 12/07/2008, 12/26/2009   Influenza,inj,Quad PF,6+ Mos 11/18/2012, 12/06/2013, 05/23/2015, 01/29/2016, 12/01/2016, 01/18/2018, 12/01/2018   Pneumococcal Conjugate-13 04/03/2014   Pneumococcal Polysaccharide-23 05/10/2019   Td 08/22/2003   Tdap 04/02/2016   Unspecified SARS-COV-2 Vaccination  05/30/2019, 04/20/2020   Zoster Recombinant(Shingrix) 12/05/2020, 05/01/2021    HPI  68 year old female patient with medical history as above.  She is here to follow-up for post surgical hypothyroidism and for follow-up of Papillary thyroid  cancer.  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 She has had thyrogen  stimulated remnant ablation with negative post therapy WBS in 2014.  - Second Thyrogen  Stimulated whole body scan for surveillance shows no scintigraphic evidence of iodine avid metastasis papillary thyroid  cancer on 04/18/2016.  - Most recent surveillance imaging with Thyrogen  stimulated whole-body scan on September 27, 2018 showed no evidence of tumor recurrence.  -Her ultrasound from November 07, 2019 was consistent with surgical changes with no new sign of recurrence or residual tumor.  Her last thyroid  imaging on November 07, 2019 also confirmed stable atrophic heterogenous residual right thyroid  lobe measuring 2.5 cm x 1.2 cm.  Stability documented since 2019.   She is now on Synthroid  125 mcg p.o. daily before breakfast.  -She has steady weight since last visit.  She has no new complaints.     Review of systems  Constitutional: + decreasing body weight,  current Body mass index is 36.98 kg/m. , no fatigue, no subjective hyperthermia,  no subjective hypothermia Eyes: no blurry vision, no xerophthalmia ENT: no sore throat, no nodules palpated in throat, no dysphagia/odynophagia, no hoarseness Cardiovascular: no chest pain, no shortness of breath, no palpitations, no leg swelling Respiratory: no cough, no shortness of breath Gastrointestinal: no nausea/vomiting/diarrhea Musculoskeletal: no muscle/joint aches Skin: no rashes, no hyperemia Neurological: no tremors, no numbness, no tingling, no dizziness Psychiatric: no depression, no anxiety  Objective:    BP 110/78 (BP Location: Left Arm, Patient Position: Sitting, Cuff Size: Large)   Pulse 92   Ht 5' 5 (1.651 m)   Wt 222 lb 3.2 oz (100.8 kg)   BMI 36.98 kg/m   Wt Readings from Last 3 Encounters:  02/04/24 222 lb 3.2 oz (100.8 kg)  11/25/23 229 lb 3.2 oz (104 kg)  10/20/23 233 lb 9.6 oz (106 kg)    BP Readings from Last 3 Encounters:  02/04/24 110/78  11/25/23 104/69  10/20/23 133/71     Physical Exam- Limited  Constitutional:  Body mass index is 36.98 kg/m. , not in acute distress, normal state of mind Eyes:  EOMI, no exophthalmos Musculoskeletal: no gross deformities, strength intact in all four extremities, no gross restriction of joint movements Skin:  no rashes, no hyperemia Neurological: no tremor with outstretched hands   Results for orders placed or performed in visit on 12/07/23  ECHOCARDIOGRAM COMPLETE   Collection Time: 12/07/23 10:19 AM  Result Value Ref Range   S' Lateral 2.50 cm   Area-P 1/2 3.16 cm2   Single Plane A2C EF 68.2 %   Single Plane A4C EF 68.8 %   Calc EF 69.3 %   AV Area VTI 1.26 cm2   MV VTI 2.53 cm2   AV Area mean vel 1.33 cm2   AR max vel 1.52 cm2   Ao pk vel 2.12 m/s   AV Mean grad 9.5 mmHg   AV Peak grad 17.9 mmHg   Est EF 60 - 65%    Complete Blood Count (Most recent): Lab Results  Component Value Date   WBC 5.7 09/03/2023   HGB 12.0 09/03/2023   HCT 38.1 09/03/2023   MCV 89 09/03/2023   PLT 226  09/03/2023   Diabetic Labs (most recent): Lab Results  Component Value Date   HGBA1C 5.2 05/26/2017   HGBA1C 5.3 08/10/2012   Lipid Panel     Component Value Date/Time   CHOL 160 09/03/2023 1031   TRIG 65 09/03/2023 1031   HDL 55 09/03/2023 1031   CHOLHDL 2.9 09/03/2023 1031   CHOLHDL 3.3 10/06/2019 0951   VLDL 14 01/29/2016 1109   LDLCALC 92 09/03/2023 1031   LDLCALC 80 10/06/2019 0951    June 01, 2017  IMPRESSION:  Questioned approximately 2.2 cm soft tissue within the right lobectomy resection bed appears similar to the 07/2015 examination.   Note, while nonspecific, this tissue was apparently present at the time of the Thyrogen  stimulated I 131 nuclear medicine whole body scan performed 03/2016 which was negative for the presence of iodine avid metastatic thyroid  cancer or residual disease, and thus favored to be benign etiology. Clinical correlation is advised.  Thyrogen  stimulated whole-body scan September 27, 2018 fINDINGS:  Excreted tracer within colon and urinary bladder. Small amount of retained gastric activity. Pattern is similar to that seen on the previous exam.   No sites of iodine-avid metastatic thyroid  cancer identified. No residual tracer accumulation in the neck.   IMPRESSION: No scintigraphic evidence of iodine-avid metastatic thyroid  cancer.     Ultrasound of thyroid  on November 07, 2019 -Right lobe 2.5 x 1.2 x 0.9 cm, left lobe surgically absent. Stable atrophic heterogeneous residual right thyroid  lobe as before. No hypervascularity. No left thyroid  bed abnormality. No regional adenopathy.   IMPRESSION: Stable thyroid  ultrasound compared 2019 as above.   Latest Reference Range & Units 08/03/22 04:15 09/29/22 09:19 10/02/23 09:17 01/25/24 09:25  TSH 0.450 - 4.500 uIU/mL 2.451 1.220 10.600 (H) 7.750 (H)  T4,Free(Direct) 0.82 - 1.77 ng/dL  8.33 8.54 8.60  (H): Data is abnormally high Assessment & Plan:   1. Hypothyroidism, postsurgical -Her  previsit thyroid  function tests are consistent with under-replacement.  Will increase Levothyroxine  to 137 mcg po daily before breakfast.  Will recheck TFTs in 4 months after taking medication properly and adjust dose accordingly.   - We discussed about the correct intake of her thyroid  hormone, on empty stomach at fasting, with water , separated by at least 30 minutes from breakfast and other medications,  and separated by more than 4 hours from calcium , iron, multivitamins, acid reflux medications (PPIs). -Patient is made aware of the fact that thyroid  hormone replacement is needed for life, dose to be adjusted by periodic monitoring of thyroid  function tests.  - Target TSH for her would be between 0.1-0.5.  2. Papillary thyroid  carcinoma (HCC) Her surveillance neck /thyroid  u/s from 11/17/2013 was c/w surgically absent thyroid , however there is a 0.9 cm left cervical lymph node. See below.  Pt is s/p thyrogen  stimulated thyroid  remnant ablation with WBS on 01/14/2013 with no evidence of iodine avid metastasis. She has had pT2,Nx,Mx FVPTC, s/p near total thyroidectomy on September 24,2014. She is counseled to maintain high degree of compliance with synthroid  therapy with aim of keeping TSH low normal ( 0.1-0.5).  -she is advised on the necessity of follow-ups and imaging studies at least one annually for the next 5 years. - She was sent for core biopsy of 0.9 cm left cervical lymph node last visit. However, this procedure was abandoned due to  no evidence of adenopathy.  The previously noticed  left cervical lymph node was sent to decrease in size to 0.5 cm. Medicaid did not cover surveillance ultrasound. -  Second Thyrogen  estimated or body scan for surveillance shows no scintigraphic evidence of iodine avid metastasis papillary thyroid  cancer on 04/18/2016. -June 01, 2017 surveillance thyroid /neck ultrasound : Unremarkable, see above.   -She underwent  Thyrogen  stimulated whole body scan which  is negative for tumor recurrence.   Her recent thyroid /neck ultrasound from September 2021 showed stable atrophic heterogenous residual thyroid  lobe measuring 2.5 x1.2 cm observed to have been stable since 2019.  She will not need any immediate intervention for this at this time.   July 2023, ultrasound showed no suspicious findings.    She is advised to keep close follow-up with Dr. Rollene Pesa for primary care needs.     I spent  14  minutes in the care of the patient today including review of labs from Thyroid  Function, CMP, and other relevant labs ; imaging/biopsy records (current and previous including abstractions from other facilities); face-to-face time discussing  her lab results and symptoms, medications doses, her options of short and long term treatment based on the latest standards of care / guidelines;   and documenting the encounter.  [name]  participated in the discussions, expressed understanding, and voiced agreement with the above plans.  All questions were answered to her satisfaction. she is encouraged to contact clinic should she have any questions or concerns prior to her return visit.   Follow up plan: Return in about 4 months (around 06/04/2024) for Thyroid  follow up, Previsit labs.  Benton Rio, [facility] [address] [place], [place] [address] Phone: [phone] Fax: [phone]   02/04/2024, 10:19 AM

## 2024-02-23 ENCOUNTER — Other Ambulatory Visit: Payer: Self-pay | Admitting: Family Medicine

## 2024-03-04 ENCOUNTER — Ambulatory Visit: Admitting: Family Medicine

## 2024-03-04 ENCOUNTER — Encounter: Payer: Self-pay | Admitting: Family Medicine

## 2024-03-04 VITALS — BP 120/70 | HR 113 | Resp 16 | Ht 65.0 in | Wt 221.0 lb

## 2024-03-04 DIAGNOSIS — E890 Postprocedural hypothyroidism: Secondary | ICD-10-CM | POA: Diagnosis not present

## 2024-03-04 DIAGNOSIS — E559 Vitamin D deficiency, unspecified: Secondary | ICD-10-CM

## 2024-03-04 DIAGNOSIS — I10 Essential (primary) hypertension: Secondary | ICD-10-CM

## 2024-03-04 DIAGNOSIS — H6121 Impacted cerumen, right ear: Secondary | ICD-10-CM

## 2024-03-04 DIAGNOSIS — E785 Hyperlipidemia, unspecified: Secondary | ICD-10-CM

## 2024-03-04 DIAGNOSIS — N184 Chronic kidney disease, stage 4 (severe): Secondary | ICD-10-CM

## 2024-03-04 DIAGNOSIS — Z6841 Body Mass Index (BMI) 40.0 and over, adult: Secondary | ICD-10-CM

## 2024-03-04 DIAGNOSIS — M818 Other osteoporosis without current pathological fracture: Secondary | ICD-10-CM

## 2024-03-04 NOTE — Patient Instructions (Addendum)
 F/U in 6 months..   You need treatment for thin bones and I ama referring you to dr Lenis because your kidney function is not good  Fasting lipid, CBC, cmp and eGFr, cBC, Vit D t in next 1 week please  Thanks for choosing [facility], we consider it a privelige to serve you.

## 2024-03-06 DIAGNOSIS — H6121 Impacted cerumen, right ear: Secondary | ICD-10-CM | POA: Insufficient documentation

## 2024-03-06 DIAGNOSIS — M810 Age-related osteoporosis without current pathological fracture: Secondary | ICD-10-CM | POA: Insufficient documentation

## 2024-03-06 NOTE — Assessment & Plan Note (Signed)
 Atraumatic successful ear flush by nursing

## 2024-03-06 NOTE — Assessment & Plan Note (Signed)
 Improved  Patient re-educated about  the importance of commitment to a  minimum of 150 minutes of exercise per week as able.  The importance of healthy food choices with portion control discussed, as well as eating regularly and within a 12 hour window most days. The need to choose clean , green food 50 to 75% of the time is discussed, as well as to make water  the primary drink and set a goal of 64 ounces water  daily.       03/04/2024    1:49 PM 02/04/2024    9:32 AM 11/25/2023    7:56 AM  Weight /BMI  Weight 221 lb 0.6 oz 222 lb 3.2 oz 229 lb 3.2 oz  Height 5' 5 (1.651 m) 5' 5 (1.651 m) 5' 5 (1.651 m)  BMI 36.78 kg/m2 36.98 kg/m2 38.14 kg/m2

## 2024-03-06 NOTE — Assessment & Plan Note (Signed)
 Controlled, no change in medication DASH diet and commitment to daily physical activity for a minimum of 30 minutes discussed and encouraged, as a part of hypertension management. The importance of attaining a healthy weight is also discussed.     03/04/2024    2:16 PM 03/04/2024    1:49 PM 02/04/2024    9:32 AM 11/25/2023    7:56 AM 10/20/2023   10:42 AM 10/06/2023    9:46 AM 10/05/2023   10:29 AM  BP/Weight  Systolic BP 120 99 110 104 133 122 116  Diastolic BP 70 56 78 69 71 70 70  Wt. (Lbs)  221.04 222.2 229.2 233.6 232.4 232.6  BMI  36.78 kg/m2 36.98 kg/m2 38.14 kg/m2 38.87 kg/m2 38.67 kg/m2 38.71 kg/m2

## 2024-03-06 NOTE — Assessment & Plan Note (Signed)
 Updated lab needed at/ before next visit.

## 2024-03-06 NOTE — Assessment & Plan Note (Signed)
 Managed by Nephrology

## 2024-03-06 NOTE — Assessment & Plan Note (Signed)
 Refer Endo for treatment has severe CKD

## 2024-03-06 NOTE — Assessment & Plan Note (Signed)
 Hyperlipidemia:Low fat diet discussed and encouraged.   Lipid Panel  Lab Results  Component Value Date   CHOL 160 09/03/2023   HDL 55 09/03/2023   LDLCALC 92 09/03/2023   TRIG 65 09/03/2023   CHOLHDL 2.9 09/03/2023     Updated lab needed at/ before next visit.

## 2024-03-06 NOTE — Progress Notes (Signed)
 "  [name]     MRN: [id number]      DOB: 12/26/1955  Chief Complaint  Patient presents with   Medical Management of Chronic Issues    Follow up on hypertension and discuss osteoporosis treatment     HPI [name] is here for follow up and re-evaluation of chronic medical conditions, medication management and review of any available recent lab and radiology data.  Preventive health is updated, specifically  Cancer screening and Immunization.   Questions or concerns regarding consultations or procedures which the PT has had in the interim are  addressed. The PT denies any adverse reactions to current medications since the last visit.  C/o popping / fullness in right ear x 3 weeks  Reports seiuzure 1 day prior and is compliant with med  ROS Denies recent fever or chills. Denies sinus pressure, nasal congestion,  or sore throat. Denies chest congestion, productive cough or wheezing. Denies chest pains, palpitations and leg swelling Denies abdominal pain, nausea, vomiting,diarrhea or constipation.   Denies dysuria, frequency, hesitancy or incontinence. Denies joint pain, swelling and limitation in mobility. Denies headaches, seizures, numbness, or tingling. Denies depression, anxiety or insomnia. Denies skin break down or rash.   PE  BP 120/70   Pulse (!) 113   Resp 16   Ht 5' 5 (1.651 m)   Wt 221 lb 0.6 oz (100.3 kg)   SpO2 93%   BMI 36.78 kg/m   Patient alert and oriented and in no cardiopulmonary distress.  HEENT: No facial asymmetry, EOMI,     Neck supple .scant cerumen in external canal right ear  Chest: Clear to auscultation bilaterally.  CVS: S1, S2 no murmurs, no S3.Regular rate.  ABD: Soft non tender.   Ext: No edema  MS: decreased  ROM spine, shoulders, hips and knees.  Skin: Intact, no ulcerations or rash noted.  Psych: Good eye contact, normal affect. Memory intact not anxious or depressed appearing.  CNS: CN 2-12 intact, power,  normal  throughout.no focal deficits noted.   Assessment & Plan  Osteoporosis Refer Endo for treatment has severe CKD  Vitamin D  deficiency Updated lab needed at/ before next visit.   Hyperlipidemia LDL goal <100 Hyperlipidemia:Low fat diet discussed and encouraged.   Lipid Panel  Lab Results  Component Value Date   CHOL 160 09/03/2023   HDL 55 09/03/2023   LDLCALC 92 09/03/2023   TRIG 65 09/03/2023   CHOLHDL 2.9 09/03/2023     Updated lab needed at/ before next visit.   Essential hypertension Controlled, no change in medication DASH diet and commitment to daily physical activity for a minimum of 30 minutes discussed and encouraged, as a part of hypertension management. The importance of attaining a healthy weight is also discussed.     03/04/2024    2:16 PM 03/04/2024    1:49 PM 02/04/2024    9:32 AM 11/25/2023    7:56 AM 10/20/2023   10:42 AM 10/06/2023    9:46 AM 10/05/2023   10:29 AM  BP/Weight  Systolic BP 120 99 110 104 133 122 116  Diastolic BP 70 56 78 69 71 70 70  Wt. (Lbs)  221.04 222.2 229.2 233.6 232.4 232.6  BMI  36.78 kg/m2 36.98 kg/m2 38.14 kg/m2 38.87 kg/m2 38.67 kg/m2 38.71 kg/m2       Morbid obesity with BMI of 40.0-44.9, adult (HCC) Improved  Patient re-educated about  the importance of commitment to a  minimum of 150 minutes of exercise per  week as able.  The importance of healthy food choices with portion control discussed, as well as eating regularly and within a 12 hour window most days. The need to choose clean , green food 50 to 75% of the time is discussed, as well as to make water  the primary drink and set a goal of 64 ounces water  daily.       03/04/2024    1:49 PM 02/04/2024    9:32 AM 11/25/2023    7:56 AM  Weight /BMI  Weight 221 lb 0.6 oz 222 lb 3.2 oz 229 lb 3.2 oz  Height 5' 5 (1.651 m) 5' 5 (1.651 m) 5' 5 (1.651 m)  BMI 36.78 kg/m2 36.98 kg/m2 38.14 kg/m2      CKD (chronic kidney disease) stage 4, GFR 15-29 ml/min  (HCC) Managed by Nephrology  Cerumen debris on tympanic membrane of right ear Atraumatic successful ear flush by nursing  Hypothyroidism, postsurgical Updated lab needed at/ before next visit.  "

## 2024-03-07 ENCOUNTER — Telehealth: Payer: Self-pay | Admitting: "Endocrinology

## 2024-03-07 NOTE — Telephone Encounter (Signed)
 Appt slots are 30 mins

## 2024-03-07 NOTE — Telephone Encounter (Signed)
 Pt currently established with [name], but Dr Antonetta sent a referral for osteoporosis, when do you want to see her Dr Lenis?

## 2024-03-07 NOTE — Telephone Encounter (Signed)
 She would not be new to the office as it has not been 3 years. I will switch her to you

## 2024-03-08 LAB — LIPID PANEL
Chol/HDL Ratio: 2.8 ratio (ref 0.0–4.4)
Cholesterol, Total: 143 mg/dL (ref 100–199)
HDL: 51 mg/dL
LDL Chol Calc (NIH): 81 mg/dL (ref 0–99)
Triglycerides: 50 mg/dL (ref 0–149)
VLDL Cholesterol Cal: 11 mg/dL (ref 5–40)

## 2024-03-08 LAB — CBC WITH DIFFERENTIAL/PLATELET
Basophils Absolute: 0.1 x10E3/uL (ref 0.0–0.2)
Basos: 1 %
EOS (ABSOLUTE): 0 x10E3/uL (ref 0.0–0.4)
Eos: 1 %
Hematocrit: 35.3 % (ref 34.0–46.6)
Hemoglobin: 11.1 g/dL (ref 11.1–15.9)
Immature Grans (Abs): 0 x10E3/uL (ref 0.0–0.1)
Immature Granulocytes: 0 %
Lymphocytes Absolute: 1.4 x10E3/uL (ref 0.7–3.1)
Lymphs: 30 %
MCH: 27.8 pg (ref 26.6–33.0)
MCHC: 31.4 g/dL — ABNORMAL LOW (ref 31.5–35.7)
MCV: 88 fL (ref 79–97)
Monocytes Absolute: 0.4 x10E3/uL (ref 0.1–0.9)
Monocytes: 8 %
Neutrophils Absolute: 2.9 x10E3/uL (ref 1.4–7.0)
Neutrophils: 60 %
Platelets: 228 x10E3/uL (ref 150–450)
RBC: 4 x10E6/uL (ref 3.77–5.28)
RDW: 14.6 % (ref 11.7–15.4)
WBC: 4.8 x10E3/uL (ref 3.4–10.8)

## 2024-03-08 LAB — CMP14+EGFR
ALT: 14 IU/L (ref 0–32)
AST: 16 IU/L (ref 0–40)
Albumin: 4.2 g/dL (ref 3.9–4.9)
Alkaline Phosphatase: 174 IU/L — ABNORMAL HIGH (ref 49–135)
BUN/Creatinine Ratio: 19 (ref 12–28)
BUN: 47 mg/dL — ABNORMAL HIGH (ref 8–27)
Bilirubin Total: 0.4 mg/dL (ref 0.0–1.2)
CO2: 19 mmol/L — ABNORMAL LOW (ref 20–29)
Calcium: 8.9 mg/dL (ref 8.7–10.3)
Chloride: 109 mmol/L — ABNORMAL HIGH (ref 96–106)
Creatinine, Ser: 2.47 mg/dL — ABNORMAL HIGH (ref 0.57–1.00)
Globulin, Total: 3.5 g/dL (ref 1.5–4.5)
Glucose: 91 mg/dL (ref 70–99)
Potassium: 5 mmol/L (ref 3.5–5.2)
Sodium: 144 mmol/L (ref 134–144)
Total Protein: 7.7 g/dL (ref 6.0–8.5)
eGFR: 21 mL/min/1.73 — ABNORMAL LOW

## 2024-03-08 LAB — VITAMIN D 25 HYDROXY (VIT D DEFICIENCY, FRACTURES): Vit D, 25-Hydroxy: 44.2 ng/mL (ref 30.0–100.0)

## 2024-03-10 ENCOUNTER — Ambulatory Visit: Payer: Self-pay | Admitting: Family Medicine

## 2024-03-28 ENCOUNTER — Ambulatory Visit: Payer: Self-pay | Admitting: Nurse Practitioner

## 2024-03-31 NOTE — Telephone Encounter (Signed)
 The patient has been notified of the result and verbalized understanding.  All questions (if any) were answered. [name], CMA 03/31/2024 4:53 PM

## 2024-03-31 NOTE — Telephone Encounter (Signed)
-----   Message from Almarie Crate, NP sent at 03/28/2024  9:51 AM EST ----- Normal heart pumping function. 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 Does also have moderately elevated pressures in his lungs, improved from previous Echo. Was  doing well at last office visit with me. Recommend to follow-up as scheduled.   Thanks!  Almarie Crate, AGNP-C

## 2024-04-18 ENCOUNTER — Ambulatory Visit: Payer: Medicare Other

## 2024-05-18 ENCOUNTER — Ambulatory Visit: Admitting: Neurology

## 2024-06-07 ENCOUNTER — Ambulatory Visit: Admitting: "Endocrinology

## 2024-09-01 ENCOUNTER — Ambulatory Visit: Payer: Self-pay | Admitting: Family Medicine

## 2024-10-06 ENCOUNTER — Ambulatory Visit: Admitting: Nurse Practitioner
